# Patient Record
Sex: Female | Born: 1945 | Race: Black or African American
Health system: Southern US, Community
[De-identification: ages and names within clinical notes are randomized; demographics above are authoritative.]

## PROBLEM LIST (undated history)

## (undated) ENCOUNTER — Ambulatory Visit (INDEPENDENT_AMBULATORY_CARE_PROVIDER_SITE_OTHER): Admission: RE | Payer: Self-pay | Admitting: Surgery

## (undated) DIAGNOSIS — E119 Type 2 diabetes mellitus without complications: Secondary | ICD-10-CM

## (undated) DIAGNOSIS — C801 Malignant (primary) neoplasm, unspecified: Secondary | ICD-10-CM

## (undated) DIAGNOSIS — E785 Hyperlipidemia, unspecified: Secondary | ICD-10-CM

## (undated) DIAGNOSIS — C50919 Malignant neoplasm of unspecified site of unspecified female breast: Secondary | ICD-10-CM

## (undated) DIAGNOSIS — R011 Cardiac murmur, unspecified: Secondary | ICD-10-CM

## (undated) DIAGNOSIS — Z9289 Personal history of other medical treatment: Secondary | ICD-10-CM

## (undated) DIAGNOSIS — R21 Rash and other nonspecific skin eruption: Secondary | ICD-10-CM

## (undated) DIAGNOSIS — H269 Unspecified cataract: Secondary | ICD-10-CM

## (undated) DIAGNOSIS — B269 Mumps without complication: Secondary | ICD-10-CM

## (undated) DIAGNOSIS — N63 Unspecified lump in unspecified breast: Secondary | ICD-10-CM

## (undated) DIAGNOSIS — R55 Syncope and collapse: Secondary | ICD-10-CM

## (undated) DIAGNOSIS — M1990 Unspecified osteoarthritis, unspecified site: Secondary | ICD-10-CM

## (undated) DIAGNOSIS — I4430 Unspecified atrioventricular block: Secondary | ICD-10-CM

## (undated) DIAGNOSIS — J45909 Unspecified asthma, uncomplicated: Secondary | ICD-10-CM

## (undated) DIAGNOSIS — K649 Unspecified hemorrhoids: Secondary | ICD-10-CM

## (undated) DIAGNOSIS — I4510 Unspecified right bundle-branch block: Secondary | ICD-10-CM

## (undated) DIAGNOSIS — B019 Varicella without complication: Secondary | ICD-10-CM

## (undated) DIAGNOSIS — D649 Anemia, unspecified: Secondary | ICD-10-CM

## (undated) DIAGNOSIS — I10 Essential (primary) hypertension: Secondary | ICD-10-CM

## (undated) DIAGNOSIS — B059 Measles without complication: Secondary | ICD-10-CM

## (undated) DIAGNOSIS — M4800 Spinal stenosis, site unspecified: Secondary | ICD-10-CM

## (undated) DIAGNOSIS — I4589 Other specified conduction disorders: Secondary | ICD-10-CM

## (undated) DIAGNOSIS — K219 Gastro-esophageal reflux disease without esophagitis: Secondary | ICD-10-CM

## (undated) DIAGNOSIS — E7800 Pure hypercholesterolemia, unspecified: Secondary | ICD-10-CM

## (undated) DIAGNOSIS — T7840XA Allergy, unspecified, initial encounter: Secondary | ICD-10-CM

## (undated) DIAGNOSIS — E7439 Other disorders of intestinal carbohydrate absorption: Secondary | ICD-10-CM

## (undated) DIAGNOSIS — Z973 Presence of spectacles and contact lenses: Secondary | ICD-10-CM

## (undated) DIAGNOSIS — N83209 Unspecified ovarian cyst, unspecified side: Secondary | ICD-10-CM

## (undated) DIAGNOSIS — N183 Chronic kidney disease, stage 3 unspecified: Secondary | ICD-10-CM

## (undated) DIAGNOSIS — Z86018 Personal history of other benign neoplasm: Secondary | ICD-10-CM

## (undated) DIAGNOSIS — N289 Disorder of kidney and ureter, unspecified: Secondary | ICD-10-CM

## (undated) DIAGNOSIS — R102 Pelvic and perineal pain unspecified side: Secondary | ICD-10-CM

## (undated) DIAGNOSIS — F419 Anxiety disorder, unspecified: Secondary | ICD-10-CM

## (undated) DIAGNOSIS — K819 Cholecystitis, unspecified: Secondary | ICD-10-CM

## (undated) DIAGNOSIS — D509 Iron deficiency anemia, unspecified: Secondary | ICD-10-CM

## (undated) DIAGNOSIS — M26609 Unspecified temporomandibular joint disorder, unspecified side: Secondary | ICD-10-CM

## (undated) DIAGNOSIS — M8580 Other specified disorders of bone density and structure, unspecified site: Secondary | ICD-10-CM

## (undated) DIAGNOSIS — R600 Localized edema: Secondary | ICD-10-CM

## (undated) DIAGNOSIS — E039 Hypothyroidism, unspecified: Secondary | ICD-10-CM

## (undated) HISTORY — DX: Personal history of other medical treatment: Z92.89

## (undated) HISTORY — DX: Rash and other nonspecific skin eruption: R21

## (undated) HISTORY — DX: Measles without complication: B05.9

## (undated) HISTORY — DX: Essential (primary) hypertension: I10

## (undated) HISTORY — DX: Syncope and collapse: R55

## (undated) HISTORY — PX: HYSTERECTOMY: SHX81

## (undated) HISTORY — PX: ABLATION OF DYSRHYTHMIC FOCUS: SHX254

## (undated) HISTORY — PX: BREAST BIOPSY: SHX20

## (undated) HISTORY — PX: TUMOR REMOVAL: SHX12

## (undated) HISTORY — DX: Varicella without complication: B01.9

## (undated) HISTORY — DX: Malignant (primary) neoplasm, unspecified: C80.1

## (undated) HISTORY — DX: Unspecified asthma, uncomplicated: J45.909

## (undated) HISTORY — DX: Other specified conduction disorders: I45.89

## (undated) HISTORY — DX: Type 2 diabetes mellitus without complications: E11.9

## (undated) HISTORY — PX: KNEE SURGERY: SHX244

## (undated) HISTORY — DX: Unspecified atrioventricular block: I44.30

## (undated) HISTORY — DX: Unspecified hemorrhoids: K64.9

## (undated) HISTORY — DX: Cardiac murmur, unspecified: R01.1

## (undated) HISTORY — DX: Hyperlipidemia, unspecified: E78.5

## (undated) HISTORY — DX: Malignant neoplasm of unspecified site of unspecified female breast: C50.919

## (undated) HISTORY — DX: Unspecified lump in unspecified breast: N63.0

## (undated) HISTORY — DX: Mumps without complication: B26.9

## (undated) HISTORY — PX: HAND SURGERY: SHX662

## (undated) HISTORY — DX: Spinal stenosis, site unspecified: M48.00

## (undated) HISTORY — PX: INCONTINENCE SURGERY: SHX676

## (undated) HISTORY — DX: Other specified disorders of bone density and structure, unspecified site: M85.80

## (undated) HISTORY — PX: DIAGNOSTIC LAPAROSCOPY: SUR761

## (undated) HISTORY — DX: Unspecified temporomandibular joint disorder, unspecified side: M26.609

## (undated) HISTORY — PX: COLONOSCOPY: SHX174

## (undated) HISTORY — DX: Other disorders of intestinal carbohydrate absorption: E74.39

## (undated) HISTORY — PX: JOINT REPLACEMENT: SHX530

## (undated) HISTORY — PX: TUBAL LIGATION: SHX77

## (undated) HISTORY — PX: EYE SURGERY: SHX253

## (undated) HISTORY — DX: Allergy, unspecified, initial encounter: T78.40XA

## (undated) HISTORY — PX: OTHER SURGICAL HISTORY: SHX169

## (undated) HISTORY — PX: CATARACT EXTRACTION: SUR2

---

## 1980-09-03 HISTORY — PX: ABDOMINAL HYSTERECTOMY: SHX81

## 1998-11-25 ENCOUNTER — Other Ambulatory Visit: Admission: RE | Admit: 1998-11-25 | Discharge: 1998-11-25 | Payer: Self-pay | Admitting: Obstetrics and Gynecology

## 1999-11-27 ENCOUNTER — Other Ambulatory Visit: Admission: RE | Admit: 1999-11-27 | Discharge: 1999-11-27 | Payer: Self-pay | Admitting: Obstetrics and Gynecology

## 2000-02-05 ENCOUNTER — Emergency Department: Admit: 2000-02-05 | Payer: Self-pay | Source: Emergency Department | Admitting: Emergency Medicine

## 2000-05-01 ENCOUNTER — Ambulatory Visit
Admit: 2000-05-01 | Disposition: A | Discharge status: Home / Self Care | Payer: Self-pay | Source: Ambulatory Visit | Admitting: Radiation Oncology

## 2000-05-14 ENCOUNTER — Ambulatory Visit: Admission: RE | Admit: 2000-05-14 | Payer: Self-pay | Source: Ambulatory Visit

## 2000-05-17 ENCOUNTER — Ambulatory Visit: Admission: RE | Admit: 2000-05-17 | Payer: Self-pay | Source: Ambulatory Visit

## 2000-06-05 ENCOUNTER — Ambulatory Visit: Admission: RE | Admit: 2000-06-05 | Payer: Self-pay | Source: Ambulatory Visit

## 2000-07-30 ENCOUNTER — Ambulatory Visit
Admit: 2000-07-30 | Disposition: A | Discharge status: Home / Self Care | Payer: Self-pay | Source: Ambulatory Visit | Admitting: Radiation Oncology

## 2000-12-20 ENCOUNTER — Other Ambulatory Visit: Admission: RE | Admit: 2000-12-20 | Discharge: 2000-12-20 | Payer: Self-pay | Admitting: Obstetrics and Gynecology

## 2002-01-12 ENCOUNTER — Other Ambulatory Visit: Admission: RE | Admit: 2002-01-12 | Discharge: 2002-01-12 | Payer: Self-pay | Admitting: Obstetrics and Gynecology

## 2002-06-30 ENCOUNTER — Ambulatory Visit
Admit: 2002-06-30 | Disposition: A | Discharge status: Home / Self Care | Payer: Self-pay | Source: Ambulatory Visit | Admitting: Family Medicine

## 2002-07-12 ENCOUNTER — Emergency Department: Admit: 2002-07-12 | Payer: Self-pay | Source: Emergency Department | Admitting: Internal Medicine

## 2003-01-22 ENCOUNTER — Other Ambulatory Visit: Admission: RE | Admit: 2003-01-22 | Discharge: 2003-01-22 | Payer: Self-pay | Admitting: Obstetrics and Gynecology

## 2003-04-23 ENCOUNTER — Emergency Department (HOSPITAL_COMMUNITY): Admission: EM | Admit: 2003-04-23 | Discharge: 2003-04-24 | Payer: Self-pay | Admitting: Emergency Medicine

## 2003-04-23 ENCOUNTER — Encounter: Payer: Self-pay | Admitting: Emergency Medicine

## 2003-04-24 ENCOUNTER — Emergency Department: Admit: 2003-04-24 | Payer: Self-pay | Source: Emergency Department

## 2003-04-27 ENCOUNTER — Other Ambulatory Visit (HOSPITAL_COMMUNITY): Admission: RE | Admit: 2003-04-27 | Discharge: 2003-05-07 | Payer: Self-pay | Admitting: Psychiatry

## 2003-06-25 ENCOUNTER — Encounter: Admission: RE | Admit: 2003-06-25 | Discharge: 2003-06-25 | Payer: Self-pay | Admitting: Family Medicine

## 2003-06-25 ENCOUNTER — Encounter: Payer: Self-pay | Admitting: Family Medicine

## 2003-06-30 ENCOUNTER — Encounter: Admission: RE | Admit: 2003-06-30 | Discharge: 2003-06-30 | Payer: Self-pay | Admitting: Family Medicine

## 2004-03-22 ENCOUNTER — Other Ambulatory Visit: Admission: RE | Admit: 2004-03-22 | Discharge: 2004-03-22 | Payer: Self-pay | Admitting: Obstetrics and Gynecology

## 2004-05-17 ENCOUNTER — Ambulatory Visit: Payer: Self-pay | Admitting: Psychology

## 2004-07-26 ENCOUNTER — Ambulatory Visit: Payer: Self-pay | Admitting: Psychology

## 2005-02-09 ENCOUNTER — Encounter: Admission: RE | Admit: 2005-02-09 | Discharge: 2005-02-09 | Payer: Self-pay | Admitting: Nephrology

## 2005-04-25 ENCOUNTER — Ambulatory Visit
Admit: 2005-04-25 | Disposition: A | Discharge status: Home / Self Care | Payer: Self-pay | Source: Ambulatory Visit | Admitting: Family Medicine

## 2005-05-16 ENCOUNTER — Other Ambulatory Visit: Admission: RE | Admit: 2005-05-16 | Discharge: 2005-05-16 | Payer: Self-pay | Admitting: Obstetrics and Gynecology

## 2005-05-30 ENCOUNTER — Emergency Department: Admit: 2005-05-30 | Payer: Self-pay | Source: Emergency Department | Admitting: Emergency Medicine

## 2005-09-03 HISTORY — PX: OTHER SURGICAL HISTORY: SHX169

## 2005-09-11 ENCOUNTER — Ambulatory Visit (HOSPITAL_BASED_OUTPATIENT_CLINIC_OR_DEPARTMENT_OTHER): Admission: RE | Admit: 2005-09-11 | Discharge: 2005-09-11 | Payer: Self-pay | Admitting: Urology

## 2005-09-30 ENCOUNTER — Emergency Department: Admit: 2005-09-30 | Payer: Self-pay | Source: Emergency Department | Admitting: Emergency Medicine

## 2005-09-30 LAB — URINALYSIS WITH MICROSCOPIC
Bilirubin, UA: NEGATIVE
Glucose, UA: NEGATIVE
Ketones UA: NEGATIVE
Leukocyte Esterase, UA: NEGATIVE
Nitrite, UA: NEGATIVE
Protein, UR: NEGATIVE
Specific Gravity UA POCT: 1.015 (ref <=1.030)
Urine pH: 7 (ref 5.0–8.0)
Urobilinogen, UA: 0.2

## 2006-06-14 ENCOUNTER — Emergency Department: Admit: 2006-06-14 | Payer: Self-pay | Source: Emergency Department | Admitting: Emergency Medicine

## 2006-06-14 LAB — CBC WITH AUTO DIFFERENTIAL CERNER
Basophils Absolute: 0.1 /mm3 (ref 0.0–0.2)
Basophils: 1 % (ref 0–2)
Eosinophils Absolute: 0.1 /mm3 (ref 0.0–0.7)
Eosinophils: 1 % (ref 0–5)
Granulocytes Absolute: 8.2 /mm3 — ABNORMAL HIGH (ref 1.8–8.1)
Hematocrit: 36.9 % — ABNORMAL LOW (ref 37.0–47.0)
Hgb: 12.2 G/DL (ref 12.0–16.0)
Lymphocytes Absolute: 1.1 /mm3 (ref 0.5–4.4)
Lymphocytes: 10 % — ABNORMAL LOW (ref 15–41)
MCH: 22.5 PG — ABNORMAL LOW (ref 28.0–32.0)
MCHC: 33 G/DL (ref 32.0–36.0)
MCV: 68 FL — ABNORMAL LOW (ref 80–100)
MPV: 7.9 FL (ref 7.4–10.4)
Monocytes Absolute: 0.8 /mm3 (ref 0.0–1.2)
Monocytes: 7 % (ref 0–11)
Neutrophils %: 80 % — ABNORMAL HIGH (ref 52–75)
Platelets: 217 /mm3 (ref 140–400)
RBC: 5.42 /mm3 — ABNORMAL HIGH (ref 4.20–5.40)
RDW: 13.9 % (ref 11.5–15.0)
WBC: 10.2 /mm3 (ref 3.5–10.8)

## 2006-06-14 LAB — HEPATIC FUNCTION PANEL
ALT: 51 U/L — ABNORMAL HIGH (ref 4–36)
AST (SGOT): 40 U/L (ref 10–41)
Albumin/Globulin Ratio: 1.2 (ref 1.1–1.8)
Albumin: 4.2 G/DL (ref 3.4–4.9)
Alkaline Phosphatase: 98 U/L (ref 43–112)
Bilirubin Direct: 0 MG/DL — AB (ref 0.0–0.3)
Bilirubin Indirect: 0.9 MG/DL (ref 0.0–1.1)
Bilirubin, Total: 0.6 MG/DL (ref 0.2–1.0)
Globulin: 3.5 G/DL (ref 2.0–3.7)
Protein, Total: 7.7 G/DL (ref 6.0–8.0)

## 2006-06-14 LAB — BASIC METABOLIC PANEL
BUN: 25 MG/DL — ABNORMAL HIGH (ref 7–21)
CO2: 28 MEQ/L (ref 22–31)
Calcium: 9.8 MG/DL (ref 8.6–10.2)
Chloride: 99 MEQ/L (ref 98–107)
Creatinine: 1 MG/DL (ref 0.5–1.4)
Glucose: 157 MG/DL — ABNORMAL HIGH (ref 65–110)
Potassium: 3.6 MEQ/L (ref 3.6–5.0)
Sodium: 140 MEQ/L (ref 136–143)

## 2007-03-28 ENCOUNTER — Ambulatory Visit
Admit: 2007-03-28 | Disposition: A | Discharge status: Home / Self Care | Payer: Self-pay | Source: Ambulatory Visit | Admitting: Orthopaedic Surgery

## 2007-04-04 ENCOUNTER — Ambulatory Visit
Admit: 2007-04-04 | Disposition: A | Discharge status: Home / Self Care | Payer: Self-pay | Source: Ambulatory Visit | Admitting: Orthopaedic Surgery

## 2007-04-04 LAB — BASIC METABOLIC PANEL
BUN: 20 mg/dL (ref 8–20)
CO2: 29 mEq/L (ref 21–30)
Calcium: 9.1 mg/dL (ref 8.6–10.2)
Chloride: 104 mEq/L (ref 98–107)
Creatinine: 1 mg/dL (ref 0.6–1.5)
Glucose: 114 mg/dL — ABNORMAL HIGH (ref 70–100)
Potassium: 4 mEq/L (ref 3.6–5.0)
Sodium: 140 mEq/L (ref 136–146)

## 2007-04-04 LAB — CBC- CERNER
Hematocrit: 30.6 % — ABNORMAL LOW (ref 37.0–47.0)
Hgb: 10.1 G/DL — ABNORMAL LOW (ref 12.0–16.0)
MCH: 23.4 PG — ABNORMAL LOW (ref 28.0–32.0)
MCHC: 33.1 G/DL (ref 32.0–36.0)
MCV: 70.7 FL — ABNORMAL LOW (ref 80.0–100.0)
MPV: 7.5 FL (ref 7.4–10.4)
Platelets: 227 /mm3 (ref 140–400)
RBC: 4.33 /mm3 (ref 4.20–5.40)
RDW: 13.6 % (ref 11.5–15.0)
WBC: 6 /mm3 (ref 3.5–10.8)

## 2007-04-04 LAB — GFR

## 2007-04-18 ENCOUNTER — Ambulatory Visit: Admission: RE | Admit: 2007-04-18 | Payer: Self-pay | Source: Ambulatory Visit | Admitting: Orthopaedic Surgery

## 2008-10-25 ENCOUNTER — Ambulatory Visit: Payer: Self-pay | Admitting: Gastroenterology

## 2008-10-27 ENCOUNTER — Ambulatory Visit
Admit: 2008-10-27 | Disposition: A | Discharge status: Home / Self Care | Payer: Self-pay | Source: Ambulatory Visit | Admitting: Rheumatology

## 2008-11-17 ENCOUNTER — Emergency Department: Admit: 2008-11-17 | Payer: Self-pay | Source: Emergency Department | Admitting: Emergency Medicine

## 2008-11-22 ENCOUNTER — Ambulatory Visit: Payer: Self-pay | Admitting: Gastroenterology

## 2009-01-05 ENCOUNTER — Ambulatory Visit
Admit: 2009-01-05 | Disposition: A | Discharge status: Home / Self Care | Payer: Self-pay | Source: Ambulatory Visit | Admitting: Family Medicine

## 2009-10-27 ENCOUNTER — Ambulatory Visit: Payer: Self-pay

## 2009-10-31 LAB — LAB USE ONLY - HISTORICAL SURGICAL PATHOLOGY

## 2010-08-16 ENCOUNTER — Ambulatory Visit: Admission: RE | Admit: 2010-08-16 | Payer: Self-pay | Source: Ambulatory Visit | Admitting: Gastroenterology

## 2010-08-17 LAB — LAB USE ONLY - HISTORICAL SURGICAL PATHOLOGY

## 2010-08-22 ENCOUNTER — Emergency Department: Admit: 2010-08-22 | Payer: Self-pay | Source: Emergency Department | Admitting: Emergency Medicine

## 2010-10-03 NOTE — Procedures (Signed)
Summary: Colonoscopy   Colonoscopy  Procedure date:  11/22/2008  Findings:      Location:  [facility].    Procedures Next Due Date:    Colonoscopy: 12/2018  COLONOSCOPY PROCEDURE REPORT  PATIENT:  April Beck, April Beck  MR#:  [id number] BIRTHDATE:   [birth year], 62 yrs. old   GENDER:   female  ENDOSCOPIST:   Rachael Fee, MD Referred by: Lynnea Ferrier, M.D.  PROCEDURE DATE:  11/22/2008 PROCEDURE:  Colonoscopy, diagnostic ASA CLASS:   Class II INDICATIONS: colorectal cancer screening, average risk   MEDICATIONS:    Fentanyl 50 mcg IV, Versed 5 mg IV  DESCRIPTION OF PROCEDURE:   After the risks benefits and alternatives of the procedure were thoroughly explained, informed consent was obtained.  Digital rectal exam was performed and revealed no rectal masses.   The LB [id number]-[id number] endoscope was introduced through the anus and advanced to the cecum, which was identified by both the appendix and ileocecal valve, without limitations.  The quality of the prep was good, using MoviPrep.  The instrument was then slowly withdrawn as the colon was fully examined. <<PROCEDUREIMAGES>>      <<OLD IMAGES>>  FINDINGS:  Mild diverticulosis was found sigmoid to descending  Internal and external hemorrhoids were found. These were medium sized, non-thrombosed.  This was otherwise a normal examination (see image2, image3, and image6).  No polyps or cancers were seen.   Retroflexed views in the rectum revealed no abnormalities.    The scope was then withdrawn from the patient and the procedure completed.  COMPLICATIONS:   None  ENDOSCOPIC IMPRESSION:  1) Mild diverticulosis in the sigmoid to descending  2) Internal and external hemorrhoids  3) Otherwise normal examination  4) No polyps or cancers  RECOMMENDATIONS:  1) You should continue follow current colorectal cancer screening guidelines for "routine risk" patients with a repeat colonoscopy in 10 years. I do not  recommend other colon cancer screening prior to then (including stool tests for microscopic blood) unless new symptoms arise.      REPEAT EXAM:   colonoscopy in 10 years   _______________________________ Rachael Fee, MD    CC: Lynnea Ferrier, MD

## 2010-10-03 NOTE — Miscellaneous (Signed)
Summary: DIRECT COLON SCREEN-AGE/YF  Clinical Lists Changes  Medications: Added new medication of MOVIPREP 100 GM  SOLR (PEG-KCL-NACL-NASULF-NA ASC-C) As per prep instructions. - Signed Rx of MOVIPREP 100 GM  SOLR (PEG-KCL-NACL-NASULF-NA ASC-C) As per prep instructions.;  #1 x 0;  Signed;  Entered by: Greer Ee RN;  Authorized by: Rachael Fee MD;  Method used: [facility] to [id number] [address] [place], [place], [place]  [address], Ph: [phone] or [phone], Fax: [phone] Observations: Added new observation of NKA: T (10/25/2008 14:03)    Prescriptions: MOVIPREP 100 GM  SOLR (PEG-KCL-NACL-NASULF-NA ASC-C) As per prep instructions.  #1 x 0   Entered by:   Greer Ee RN   Authorized by:   Rachael Fee MD   Signed by:   Greer Ee RN on 10/25/2008   Method used:   [facility]  [address] [phone]* (retail)       [address]       [place], [place]  [address]       Ph: [phone] or [phone]       Fax: [phone]   RxID:   [id number]

## 2010-11-22 ENCOUNTER — Ambulatory Visit (INDEPENDENT_AMBULATORY_CARE_PROVIDER_SITE_OTHER)
Admit: 2010-11-22 | Disposition: A | Discharge status: Home / Self Care | Payer: Self-pay | Source: Ambulatory Visit | Admitting: Family Medicine

## 2011-01-19 NOTE — Op Note (Signed)
NAMEPAMLA, PANGLE              ACCOUNT NO.:  [id number]   MEDICAL RECORD NO.:  [id number]          PATIENT TYPE:  AMB   LOCATION:  [facility]                         FACILITY:  [facility]   PHYSICIAN:  Excell Seltzer. Annabell Howells, M.D.    DATE OF BIRTH:  1946-01-26   DATE OF PROCEDURE:  09/11/2005  DATE OF DISCHARGE:                                 OPERATIVE REPORT   PROCEDURE:  Lynx sling.   PREOPERATIVE DIAGNOSIS:  Stress incontinence.   POSTOPERATIVE DIAGNOSIS:  Stress incontinence.   SURGEON:  Dr. Bjorn Pippin.   ANESTHESIA:  General.   DRAIN:  Foley catheter and vaginal pack.   COMPLICATIONS:  None.   INDICATIONS:  Ms. Happel is a 65 year old white female with stress  incontinence whose elected a sling for correction.   FINDINGS AND PROCEDURE:  The patient had been given p.o. Cipro  preoperatively. She was taken to the operating room where a general  anesthetic was induced. She was placed in lithotomy position, her mons was  shaved, her perineum and genitalia were prepped with Betadine solution and  she was draped in the usual sterile fashion. A Foley catheter was inserted  and the bladder was drained. A weighted vaginal retractor was placed, the  anterior vaginal wall was infiltrated with 1% lidocaine with epinephrine  approximately 5 mL. A midline anterior vaginal wall incision was made  approximately 2 cm in length over the mid urethral area. The mucosa was  elevated laterally approximately 2 cm to allow placement of a finger  adjacent to the urethra on each side. Small incisions were made 2 cm lateral  to the midline on each side above the pubis. The fat was spread to the  fascia.   The first [place] trocar was passed on the right, the tip was brought down to  the top the pubis, walked along the back of the pubis until it could be  palpated by a finger in the right vaginal incision. The trocar was then  passed into the vaginal vault. This was then repeated on the left side.   Cystoscopy was then performed using a 22-French scope and 70 degree lens.  Examination revealed no evidence of bladder wall injury. The bladder was  drained.   The [place] sling was then secured to two trocars and drawn up into the  abdominal incisions. Repeat cystoscopy at this point once again revealed no  evidence of bladder wall or urethral injury.   At this point, the Foley catheter was reinserted, the sling was tensioned,  the sheaths were removed and the position was confirmed. Once the device was  felt to be adequately tensioned, the vaginal incision was closed using  running locked 2-0 Vicryl suture. The abdominal ends of the sling were then  trimmed and the abdominal incisions were closed with Dermabond. A 2-inch  Iodoform vaginal pack was placed.   The patient was taken down from the lithotomy position, her anesthetic was  reversed. She was moved to the recovery room in stable condition, there were  no complications.      Excell Seltzer. Annabell Howells, M.D.  Electronically Signed  JJW/MEDQ  D:  09/11/2005  T:  09/11/2005  Job:  [id number]   cc:   Ernestina Penna, M.D.  Fax: [phone]

## 2011-04-07 ENCOUNTER — Emergency Department
Admit: 2011-04-07 | Disposition: A | Discharge status: Home / Self Care | Payer: Self-pay | Source: Emergency Department | Admitting: Emergency Medicine

## 2011-04-07 LAB — URINALYSIS, REFLEX TO MICROSCOPIC EXAM IF INDICATED
Bilirubin, UA: NEGATIVE
Blood, UA: NEGATIVE
Glucose, UA: NEGATIVE
Ketones UA: NEGATIVE
Leukocyte Esterase, UA: NEGATIVE
Nitrite, UA: NEGATIVE
Protein, UR: NEGATIVE
Specific Gravity UA POCT: 1.02 (ref 1.001–1.035)
Urine pH: 6 (ref 5.0–8.0)
Urobilinogen, UA: 0.2 mg/dL

## 2011-05-27 ENCOUNTER — Emergency Department
Admit: 2011-05-27 | Disposition: A | Discharge status: Home / Self Care | Payer: Self-pay | Source: Emergency Department | Admitting: Emergency Medicine

## 2011-06-12 LAB — ECG 12-LEAD
Atrial Rate: 90 {beats}/min
P Axis: 51 degrees
P-R Interval: 188 ms
Q-T Interval: 402 ms
QRS Duration: 98 ms
QTC Calculation (Bezet): 491 ms
R Axis: -20 degrees
T Axis: 29 degrees
Ventricular Rate: 90 {beats}/min

## 2011-06-21 NOTE — Op Note (Unsigned)
DATE OF BIRTH:                        [birth year]      ADMISSION DATE:                     04/18/2007            PATIENT LOCATION:                     [id number]            DATE OF PROCEDURE:                   04/18/2007      SURGEON:                            Renaldo Reel, MD      ASSISTANT(S):                  PREOPERATIVE DIAGNOSIS:  RECURRENT RIGHT CARPAL TUNNEL SYNDROME.            POSTOPERATIVE DIAGNOSIS:   RECURRENT RIGHT CARPAL TUNNEL SYNDROME.            PROCEDURES:      1.   REVISION RIGHT CARPAL TUNNEL SYNDROME.      2.   HYPOTHENAR FAT PAD COVERAGE FOR MEDIAN NERVE RIGHT CARPAL TUNNEL.            ANESTHESIA:  Bier block.            TOURNIQUET TIME:  60 minutes.            IMPLANTS:  None.            DRAINS:  None.            SPECIMENS:  None.            COMPLICATIONS:  None.            INDICATIONS:  Ms. Helen [name] is a 65 year old female who had symptoms of right      hand numbness. She had an original right carpal tunnel release back in      1991. She had worsening symptoms most recently, and between 2006 and 2008      had separate electrodiagnostic studies that showed progressive worsening.      She had improvement from a cortisone injection into the carpal tunnel for 2      months but then subsided.  She was indicated for the above-mentioned      procedure.  The risks, benefits, and alternatives were explained.  Informed      consent was obtained.            DESCRIPTION OF PROCEDURE:  The patient was placed supine with the right      upper extremity on a hand table.  A Bier block was administered by      anesthesia.  The extremity was then prepped and draped in a sterile      fashion.  A longitudinal incision in the palm was made in line with the 3-4      web space and then oblique across the distal wrist crease.  It was carried      down through skin and subcutaneous tissue.  The palmar fascia was      identified and incised longitudinally.  Deep to this the transverse carpal  ligament was  identified and incised in the mid wound.  It was incised in a      distal direction to the fat pad keeping in line with the 3-4 web space, and      then it was carried in a proximal direction and carried proximal to the      distal wrist crease.  Inspection of the median nerve seemed to show that it      was slightly more adherent to the radial leaf of the now incised transverse      carpal ligament. This adhesion was released bluntly.  Next, the hypothenar      fat pad graft was raised.  It was raised sharply until it was in line with      the fifth ray.  In terms of the deep dissection for the fat pad graft, the      ulnar neurovascular bundle was identified just ulnar to the ulnar leaf of      the transverse carpal ligament.  The ulnar artery was encountered first.      Once the deep part of the fat pad graft was freed, a significant portion of      the ulnar leaf of the transverse carpal ligament was excised. The fat pad      graft was then pulled over in a radial direction, and pulling the median      nerve and flexor tendons as well as the FPL in an ulnar direction, the fat      pad was able to reach the radial wall of the carpal tunnel.  Two mattress      sutures with nonabsorbable suture were then placed from the radial aspect      of the hypothenar fat pad graft to the radial wall of the carpal tunnel      while retracting the FPL as well as median nerve and other flexor tendons      in an ulnar direction.  These two sutures were placed first before tying      each of them sequentially.  The wounds were then irrigated with copious      amounts of normal saline. The skin was reapproximated with nylon suture.  A      dry, sterile dressing was placed as well as a splint to cup the ulnar and      radial aspects of the hand as well as maintain radial and palmar abduction      of the thumb to take tension off the graft. Local anesthetic had been      placed prior to dressing.  Sponge and needle counts were  correct x2.                                          ___________________________________          Date Signed: __________      Renaldo Reel, MD  ([id number])            D: 04/19/2007 by Renaldo Reel, MD      T: 04/20/2007 by [id number] ([id number]) ([id number])      cc:  Renaldo Reel, MD

## 2011-08-21 NOTE — Op Note (Signed)
Introduction: [id number] Document ID: [id number] -- 65 year old female      patient presents for an outpatient Colonoscopy on 08/16/2010.            Indications: Average-risk screening.            Consent: The benefits, risks, and alternatives to the procedure were      discussed. I have discussed with the patient, especially the risks of      bleeding, perforation, and infection. Informed consent was obtained from      the patient.            Preparation: EKG, pulse, pulse oximetry, and blood pressure were monitored      throughout the procedure.            Medications: IVA anesthesia. Medication administered by anesthesiologist.            Rectal Exam: Small internal hemorrhoids. No masses were felt on digital      rectal examination.            Procedure: The colonoscope was passed through the anus under direct      visualization and was advanced with ease to the cecum, confirmed by      landmarks. The colonoscope was then introduced into the terminal ileum.      The scope was gradually withdrawn and the mucosa was carefully examined.      The quality of the preparation was excellent. The views were excellent.      The patient's toleration of the procedure was excellent. Retroflexion was      performed in the rectum.            Estimated Blood Loss: Negligible.            Findings: There was evidence of mild diverticulosis in the mid-sigmoid.  A      single sessile polyp, measuring 13 mm in size, was found in the sigmoid      colon and distal sigmoid.  The polyp was completely removed by snare      cautery polypectomy. The polyp was retrieved and placed in jar 1. A single      small flat polyp, measuring 4 mm in size, was found in the colon.  The      polyp was completely removed by hot biopsy polypectomy. The polyp was      retrieved.            Unplanned Events: There were no unplanned events.            Summary: Small internal hemorrhoids were found (455.0). Mild      diverticulosis (562.10) found in the  mid-sigmoid. A single sessile polyp      was found in the sigmoid colon and distal sigmoid (211.3); removed by      snare cautery polypectomy. A single small flat polyp was found in the      colon; removed by hot biopsy polypectomy.            Recommendations: Start high fiber diet. Follow-up appointment with Elsie Ra, MD in 1 month.            Patient Transfer: The patient condition on transfer to recovery was stable.            Procedure Codes: [45385]Colonoscopy with snare polypectomy [45384]:      Colonoscopy with removal of tumor(s), polyp(s), or other lesion(s) by hot  biopsy forceps or bipolar cautery      Version 1, electronically signed by Dr. Elsie Ra on 08/16/2010 at      10:30.

## 2011-08-21 NOTE — Op Note (Signed)
Introduction:[id number] Document ID: [id number] -- 66 year old female      patient presents for an outpatient Esophagogastroduodenoscopy on      08/16/2010.            Indications: GERD (530.81).            Consent: The benefits, risks, and alternatives to the procedure were      discussed. The potential risks of bleeding, perforation, and infection are      especially explained to the patient. Informed consent was obtained from      the patient.            Preparation: EKG, pulse, pulse oximetry, and blood pressure were monitored      throughout the procedure.            Medications: IVA anesthesia. Medication administered by anesthesiologist.            Procedure: The gastroscope was passed through the mouth under direct      visualization and was advanced with ease to the 4th portion of the      duodenum. The scope was withdrawn and the mucosa was carefully examined.      The views were excellent. The patient's toleration of the procedure was      excellent. Retroflexion was performed in the stomach.            Estimated Blood Loss: Negligible.            Findings:   Esophagus: There was a 1 cm hiatus hernia visible in the      esophagus.  Esophagitis was found in the distal esophagus. There was      erosion in the esophagus.  Stomach: Mild gastritis was found in the      antrum.  Multiple cold forceps biopsies were taken.  Duodenum: The      duodenum appeared to be normal. The major papilla appeared to be normal.            Unplanned Events: There were no unplanned events.            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 Esophagitis      seen (530.10). An area of esophageal erosion was found (530.89). Mild      gastritis was found in the antrum (535.50). Multiple biopsies taken.      Normal duodenum. Normal major papilla.            Recommendations: Follow-up appointment with Elsie Ra, MD in 1      month. Raise the head of the bed 4 to 6 inches. Avoid smoking. Avoid      excess  coffee, tea or other caffeinated beverages. Avoid garments that fit      tightly through the abdomen. Avoid eating before bed. Start anti-reflux      diet. Continue medications as prescribed.            Patient Transfer: The patient condition on transfer to recovery was stable.            Procedure Codes: [43239]EGD with biopsy      Version 1, electronically signed by Dr. Elsie Ra on 08/16/2010 at      10:02.

## 2012-01-21 ENCOUNTER — Emergency Department
Admit: 2012-01-21 | Discharge: 2012-01-21 | Disposition: A | Discharge status: Home / Self Care | Payer: Self-pay | Source: Emergency Department | Admitting: Emergency Medicine

## 2012-03-24 ENCOUNTER — Encounter (INDEPENDENT_AMBULATORY_CARE_PROVIDER_SITE_OTHER): Payer: Self-pay | Admitting: Specialist

## 2012-03-24 ENCOUNTER — Ambulatory Visit (INDEPENDENT_AMBULATORY_CARE_PROVIDER_SITE_OTHER): Payer: Medicare Other | Admitting: Specialist

## 2012-03-24 VITALS — BP 111/63 | HR 74 | Temp 98.0°F | Ht 62.0 in | Wt 181.8 lb

## 2012-03-24 DIAGNOSIS — I10 Essential (primary) hypertension: Secondary | ICD-10-CM

## 2012-03-24 DIAGNOSIS — E785 Hyperlipidemia, unspecified: Secondary | ICD-10-CM

## 2012-03-24 DIAGNOSIS — IMO0001 Reserved for inherently not codable concepts without codable children: Secondary | ICD-10-CM

## 2012-03-24 DIAGNOSIS — IMO0002 Reserved for concepts with insufficient information to code with codable children: Secondary | ICD-10-CM

## 2012-03-24 DIAGNOSIS — E119 Type 2 diabetes mellitus without complications: Secondary | ICD-10-CM

## 2012-03-24 NOTE — Progress Notes (Signed)
CHIEF COMPLAINT:  Type 2 diabetes.     HISTORY OF PRESENT ILLNESS:  Helen [name] is a pleasant 66 year old who presents in our clinic for  initial evaluation.     [name] tells me she was diagnosed with diabetes approximately 20 years  ago and has no known complications from it.  Her most recent hemoglobin A1c  in May of 2013 was noted to be elevated at 9.8%.  Her point of care  hemoglobin A1c in clinic today is 8.2%, which is significantly improved  since her last A1c.  She currently manages her diabetes by taking glipizide  5 mg twice a day.  She tells me she was on metformin before, but gave her  facial swelling and therefore it was discontinued.     [name] did not bring her glucometer to the clinic today.  She tells me  that her fasting blood glucoses typically range between 100 to 150 mg/dL.   She also mentioned that recently due to financial reasons, she has been on  food stamps, and typically she has been able to only get cheap foods, which  are more rich in carbohydrates.  Therefore, she has not been able to be  more compliant with her diabetic diet.  She is aware of the implications of  uncontrolled type 2 diabetes, such as neuropathy, nephropathy, and  retinopathy.  She also follows with a dietitian and tells me that she has  been working toward weight loss.     Ms. Hartis blood pressure in our clinic today is at goal at 111/63.  She  is currently on amlodipine and Diovan with hydrochlorothiazide daily for  her blood pressure.  Recent serum creatinine level done in May of 2013 is  noted to be normal at 0.73.  However, urine microalbumin level was not done  at the time.  Her recent cholesterol panel showed her LDL was at goal at  48.  She is currently on Zetia 10 mg daily.  No known history of coronary  artery disease.  She denies any paresthesias or numbness in her lower  extremities.  No prior history of open ulcers, lesions, or amputations.   She denies any history of retinopathy.  She tells  me she follows with an  ophthalmologist annually.  Denies any eye symptoms today.     REVIEW OF SYSTEMS:  As mentioned above.  In addition, denies any nausea, vomiting, abdominal  pain, hematuria, or dysuria.  Denies any polyuria, polyphagia, or  polydipsia.  Denies paresthesias or numbness in her lower extremities.   Mood is stable.  All other systems reviewed and were negative.     PAST MEDICAL HISTORY:  Significant for history of hypertension, history of hyperlipidemia, history  of type 2 diabetes, history of breast cancer.     CURRENT MEDICATIONS:  Amlodipine 10 mg daily, Lipitor 40 mg daily, Zetia 10 mg daily, glipizide 5  mg twice a day, Protonix 40 mg daily, Diovan/hydrochlorothiazide 160/12.5  mg daily.     ALLERGIES:  ASPIRIN causes sweatiness and dizziness, METFORMIN causes facial swelling.     FAMILY HISTORY:  No known endocrinopathies.     SOCIAL HISTORY:  Denies any tobacco, alcohol, or illicit drug use.  She is disabled due to  chronic medical condition.  Lives with her daughter.     PHYSICAL EXAMINATION:  VITAL SIGNS:  Blood pressure 111/63, pulse 74 per minute and regular,  weight 181 pounds, height 5 feet 2 inches, BMI 33.2.  GENERAL:  Pleasant and conversant, in no acute distress.  HEENT:  Pupils equal and reactive bilaterally.  Moist mucous membranes.  NECK:  Supple and not enlarged.  Thyroid palpable and not enlarged.  LUNGS:  Clear to auscultation bilaterally.  No wheezes or rales.  CARDIOVASCULAR:  Regular rate and rhythm.  ABDOMEN:  Soft, nontender.  Bowel sounds heard.  EXTREMITIES:  No edema seen.  NEUROLOGIC:  Deep tendon reflexes 1+ at the ankle and at the knees.  SKIN:  Warm and dry.  No rash seen.  EXTREMITIES:  No edema.  Monofilament 10/10 bilaterally.  PSYCHIATRIC:  Mood and affect normal.     LABORATORY AND DIAGNOSTIC DATA:  Point of care hemoglobin A1c 8.2% in May of 2013.  Sodium 139, potassium  3.7, calcium 9.8, AST 21, ALT 20, total protein 7.4.  Creatinine 0.7,  albumin 4.3.   Hemoglobin A1c 9.8%.  Hemoglobin 11.3; hematocrit 36.4;  platelets 249.  Total cholesterol 122, triglycerides 42, LDL 48, HDL 67.   TSH 1.21.     ASSESSMENT AND PLAN:  [name] is a 66 year old who presents in our clinic for evaluation of  uncontrolled type 2 diabetes.  1.  Glycemic control:  Her hemoglobin A1c has improved significantly since  May of 2013.  It was 8.2% as opposed to 9.8% before.  I encouraged her to  continue her dietary modifications and increase her glipizide to 10 mg  twice a day.  The patient has unfortunately not been able to tolerate  metformin in the past.  If glycemic control does not improve on sulfanuria,  additional therapy, such as DDP-4 inhibitor, may be considered.  The  patient is aware of the complications of uncontrolled diabetes and willing  toward tighter glycemic control.  I would like to repeat a hemoglobin A1c,  comprehensive panel, lipid profile, urine microalbumin, and thyroid  function tests in 3 months.  I gave her a requisition for this.  2.  Diabetes complications:  A.  Retinopathy:  No known history.  I encouraged her to have annual  retinopathy surveillance exam.  Denies any eye symptoms today.  B.  Neuropathy:  No symptoms of neuropathy.  Foot exam was normal in the  clinic today.  Discussed diabetic foot hygiene.  C.  Nephropathy and hypertension:  Blood pressure is at goal.  She is  currently on ARB therapy.  I would like to check a urine microalbumin  level.    D.  Hyperlipidemia:  Currently on Zetia and Lipitor.  Recent lipid profile  is at goal.     It was a pleasure taking care of Ms. Tauzin in our clinic.  I would like  to see her back in our clinic in 3 months.  However, I asked her to contact  us sooner if she were to have symptoms of hypoglycemia or hyperglycemia.

## 2012-03-25 LAB — POCT HEMOGLOBIN A1C: POCT Hgb A1C: 8.2 — AB (ref 3.9–5.9)

## 2012-03-25 LAB — POCT GLUCOSE: Whole Blood Glucose POCT: 321 mg/dL — AB (ref 70–100)

## 2012-03-28 ENCOUNTER — Encounter (INDEPENDENT_AMBULATORY_CARE_PROVIDER_SITE_OTHER): Payer: Self-pay

## 2012-03-29 ENCOUNTER — Encounter (INDEPENDENT_AMBULATORY_CARE_PROVIDER_SITE_OTHER): Payer: Self-pay | Admitting: Specialist

## 2012-03-29 DIAGNOSIS — IMO0002 Reserved for concepts with insufficient information to code with codable children: Secondary | ICD-10-CM | POA: Insufficient documentation

## 2012-03-29 DIAGNOSIS — I10 Essential (primary) hypertension: Secondary | ICD-10-CM | POA: Insufficient documentation

## 2012-03-29 DIAGNOSIS — E785 Hyperlipidemia, unspecified: Secondary | ICD-10-CM | POA: Insufficient documentation

## 2012-04-08 ENCOUNTER — Telehealth (INDEPENDENT_AMBULATORY_CARE_PROVIDER_SITE_OTHER): Payer: Self-pay | Admitting: Specialist

## 2012-04-08 ENCOUNTER — Other Ambulatory Visit (INDEPENDENT_AMBULATORY_CARE_PROVIDER_SITE_OTHER): Payer: Self-pay

## 2012-04-08 MED ORDER — GLIPIZIDE 10 MG PO TABS
10.0000 mg | ORAL_TABLET | Freq: Two times a day (BID) | ORAL | Status: DC
Start: 2012-04-08 — End: 2012-09-04

## 2012-04-08 NOTE — Telephone Encounter (Signed)
PT. CALLED FOR A GLIPIZIDE REFILL (AS PER PATIENT, TAKES TWO 5MG  TABLETS TWICE A DAY) FOR 90 DAYS 3 REFILLS TO [facility] @[phone].. PT. ONLY HAS FOUR PILLS LEFT, FORWARDING CONCERN TO THE NURSE.-OKT

## 2012-04-25 ENCOUNTER — Telehealth (INDEPENDENT_AMBULATORY_CARE_PROVIDER_SITE_OTHER): Payer: Self-pay | Admitting: Specialist

## 2012-04-25 NOTE — Telephone Encounter (Signed)
PATIENT CALLED TO NOTIFY Helen [name] THAT SHE IS OUT OF GLIPIZIDE  AND NEEDS A REFILL SENT TO HER [facility] ON [address]. THE PATIENT NEEDS MEDICATION BEFORE 5P.M. TO MAINTAIN A TIMELY DOSEAGE (PT PHONE:[phone])-OKT

## 2012-04-25 NOTE — Telephone Encounter (Signed)
TG,    Pls call Glipizide 5 mg ( 2 pills) twice daily for this pt. Give 90 day supply with 1 refill.

## 2012-04-25 NOTE — Telephone Encounter (Signed)
I CALLED IN GLIPIZIDE 5 MG TWO PILLS PO BID 90 DAYS WITH 1 REFILL TO [facility]. PT IS INFORMED.

## 2012-05-19 ENCOUNTER — Ambulatory Visit
Admission: RE | Admit: 2012-05-19 | Discharge: 2012-05-19 | Disposition: A | Discharge status: Home / Self Care | Payer: 59 | Source: Ambulatory Visit | Attending: Family Medicine | Admitting: Family Medicine

## 2012-05-19 ENCOUNTER — Other Ambulatory Visit: Payer: Self-pay | Admitting: Family Medicine

## 2012-05-19 DIAGNOSIS — R7989 Other specified abnormal findings of blood chemistry: Secondary | ICD-10-CM

## 2012-05-20 ENCOUNTER — Other Ambulatory Visit: Payer: Self-pay

## 2012-05-21 ENCOUNTER — Other Ambulatory Visit: Payer: Self-pay

## 2012-06-19 ENCOUNTER — Encounter (HOSPITAL_COMMUNITY): Payer: Self-pay | Admitting: Emergency Medicine

## 2012-06-19 ENCOUNTER — Observation Stay (HOSPITAL_COMMUNITY)
Admission: EM | Admit: 2012-06-19 | Discharge: 2012-06-21 | Disposition: A | Discharge status: Home / Self Care | Payer: 59 | Attending: General Surgery | Admitting: General Surgery

## 2012-06-19 ENCOUNTER — Emergency Department (HOSPITAL_COMMUNITY): Payer: 59

## 2012-06-19 DIAGNOSIS — E7800 Pure hypercholesterolemia, unspecified: Secondary | ICD-10-CM | POA: Insufficient documentation

## 2012-06-19 DIAGNOSIS — R1013 Epigastric pain: Secondary | ICD-10-CM | POA: Insufficient documentation

## 2012-06-19 DIAGNOSIS — K812 Acute cholecystitis with chronic cholecystitis: Secondary | ICD-10-CM

## 2012-06-19 DIAGNOSIS — K219 Gastro-esophageal reflux disease without esophagitis: Secondary | ICD-10-CM | POA: Insufficient documentation

## 2012-06-19 DIAGNOSIS — K819 Cholecystitis, unspecified: Secondary | ICD-10-CM | POA: Diagnosis present

## 2012-06-19 DIAGNOSIS — N289 Disorder of kidney and ureter, unspecified: Secondary | ICD-10-CM

## 2012-06-19 DIAGNOSIS — I10 Essential (primary) hypertension: Secondary | ICD-10-CM | POA: Insufficient documentation

## 2012-06-19 DIAGNOSIS — K821 Hydrops of gallbladder: Secondary | ICD-10-CM | POA: Insufficient documentation

## 2012-06-19 DIAGNOSIS — Z23 Encounter for immunization: Secondary | ICD-10-CM | POA: Insufficient documentation

## 2012-06-19 DIAGNOSIS — M19049 Primary osteoarthritis, unspecified hand: Secondary | ICD-10-CM | POA: Insufficient documentation

## 2012-06-19 DIAGNOSIS — F411 Generalized anxiety disorder: Secondary | ICD-10-CM | POA: Insufficient documentation

## 2012-06-19 DIAGNOSIS — R112 Nausea with vomiting, unspecified: Secondary | ICD-10-CM | POA: Insufficient documentation

## 2012-06-19 DIAGNOSIS — E039 Hypothyroidism, unspecified: Secondary | ICD-10-CM

## 2012-06-19 HISTORY — DX: Unspecified osteoarthritis, unspecified site: M19.90

## 2012-06-19 HISTORY — DX: Cholecystitis, unspecified: K81.9

## 2012-06-19 HISTORY — DX: Hypothyroidism, unspecified: E03.9

## 2012-06-19 HISTORY — DX: Anxiety disorder, unspecified: F41.9

## 2012-06-19 HISTORY — DX: Disorder of kidney and ureter, unspecified: N28.9

## 2012-06-19 HISTORY — DX: Pure hypercholesterolemia, unspecified: E78.00

## 2012-06-19 HISTORY — DX: Gastro-esophageal reflux disease without esophagitis: K21.9

## 2012-06-19 HISTORY — DX: Essential (primary) hypertension: I10

## 2012-06-19 LAB — CBC
HCT: 39.1 % (ref 36.0–46.0)
Hemoglobin: 12.7 g/dL (ref 12.0–15.0)
MCH: 30.8 pg (ref 26.0–34.0)
MCHC: 32.5 g/dL (ref 30.0–36.0)
MCV: 94.7 fL (ref 78.0–100.0)
Platelets: 178 10*3/uL (ref 150–400)
RBC: 4.13 MIL/uL (ref 3.87–5.11)
RDW: 13 % (ref 11.5–15.5)
WBC: 7.3 10*3/uL (ref 4.0–10.5)

## 2012-06-19 LAB — CBC WITH DIFFERENTIAL/PLATELET
Basophils Absolute: 0 10*3/uL (ref 0.0–0.1)
Basophils Relative: 0 % (ref 0–1)
Eosinophils Absolute: 0.1 10*3/uL (ref 0.0–0.7)
Eosinophils Relative: 1 % (ref 0–5)
HCT: 41.8 % (ref 36.0–46.0)
Hemoglobin: 13.7 g/dL (ref 12.0–15.0)
Lymphocytes Relative: 20 % (ref 12–46)
Lymphs Abs: 2.3 10*3/uL (ref 0.7–4.0)
MCH: 30.9 pg (ref 26.0–34.0)
MCHC: 32.8 g/dL (ref 30.0–36.0)
MCV: 94.1 fL (ref 78.0–100.0)
Monocytes Absolute: 0.8 10*3/uL (ref 0.1–1.0)
Monocytes Relative: 7 % (ref 3–12)
Neutro Abs: 8.4 10*3/uL — ABNORMAL HIGH (ref 1.7–7.7)
Neutrophils Relative %: 72 % (ref 43–77)
Platelets: 191 10*3/uL (ref 150–400)
RBC: 4.44 MIL/uL (ref 3.87–5.11)
RDW: 12.9 % (ref 11.5–15.5)
WBC: 11.6 10*3/uL — ABNORMAL HIGH (ref 4.0–10.5)

## 2012-06-19 LAB — CREATININE, SERUM
Creatinine, Ser: 1.04 mg/dL (ref 0.50–1.10)
GFR calc Af Amer: 64 mL/min — ABNORMAL LOW (ref >90)
GFR calc non Af Amer: 55 mL/min — ABNORMAL LOW (ref >90)

## 2012-06-19 LAB — COMPREHENSIVE METABOLIC PANEL
ALT: 20 U/L (ref 0–35)
AST: 23 U/L (ref 0–37)
Albumin: 3.9 g/dL (ref 3.5–5.2)
Alkaline Phosphatase: 73 U/L (ref 39–117)
BUN: 30 mg/dL — ABNORMAL HIGH (ref 6–23)
CO2: 29 mEq/L (ref 19–32)
Calcium: 9.4 mg/dL (ref 8.4–10.5)
Chloride: 101 mEq/L (ref 96–112)
Creatinine, Ser: 1.27 mg/dL — ABNORMAL HIGH (ref 0.50–1.10)
GFR calc Af Amer: 50 mL/min — ABNORMAL LOW (ref >90)
GFR calc non Af Amer: 43 mL/min — ABNORMAL LOW (ref >90)
Glucose, Bld: 155 mg/dL — ABNORMAL HIGH (ref 70–99)
Potassium: 3.8 mEq/L (ref 3.5–5.1)
Sodium: 144 mEq/L (ref 135–145)
Total Bilirubin: 0.4 mg/dL (ref 0.3–1.2)
Total Protein: 7.2 g/dL (ref 6.0–8.3)

## 2012-06-19 LAB — LIPASE, BLOOD: Lipase: 59 U/L (ref 11–59)

## 2012-06-19 MED ORDER — HYDROMORPHONE HCL PF 1 MG/ML IJ SOLN
1.0000 mg | Freq: Once | INTRAMUSCULAR | Status: AC
  Administered 2012-06-19: 1 mg via INTRAVENOUS
  Filled 2012-06-19: qty 1

## 2012-06-19 MED ORDER — PNEUMOCOCCAL VAC POLYVALENT 25 MCG/0.5ML IJ INJ
0.5000 mL | INJECTION | INTRAMUSCULAR | Status: AC
  Administered 2012-06-20: 0.5 mL via INTRAMUSCULAR
  Filled 2012-06-19: qty 0.5

## 2012-06-19 MED ORDER — HYDROMORPHONE HCL PF 1 MG/ML IJ SOLN
0.5000 mg | INTRAMUSCULAR | Status: DC | PRN
  Administered 2012-06-20 – 2012-06-21 (×2): 0.5 mg via INTRAVENOUS
  Administered 2012-06-21: 1 mg via INTRAVENOUS
  Filled 2012-06-19 (×5): qty 1

## 2012-06-19 MED ORDER — PANTOPRAZOLE SODIUM 40 MG IV SOLR
40.0000 mg | Freq: Once | INTRAVENOUS | Status: AC
  Administered 2012-06-19: 40 mg via INTRAVENOUS
  Filled 2012-06-19: qty 40

## 2012-06-19 MED ORDER — DIPHENHYDRAMINE HCL 50 MG/ML IJ SOLN
12.5000 mg | Freq: Four times a day (QID) | INTRAMUSCULAR | Status: DC | PRN
  Administered 2012-06-20: 25 mg via INTRAVENOUS
  Filled 2012-06-19: qty 1

## 2012-06-19 MED ORDER — HEPARIN SODIUM (PORCINE) 5000 UNIT/ML IJ SOLN
5000.0000 [IU] | Freq: Three times a day (TID) | INTRAMUSCULAR | Status: DC
  Administered 2012-06-19 – 2012-06-21 (×6): 5000 [IU] via SUBCUTANEOUS
  Filled 2012-06-19 (×10): qty 1

## 2012-06-19 MED ORDER — CIPROFLOXACIN IN D5W 400 MG/200ML IV SOLN
400.0000 mg | Freq: Two times a day (BID) | INTRAVENOUS | Status: DC
  Administered 2012-06-19 – 2012-06-21 (×5): 400 mg via INTRAVENOUS
  Filled 2012-06-19 (×6): qty 200

## 2012-06-19 MED ORDER — DIPHENHYDRAMINE HCL 12.5 MG/5ML PO ELIX
12.5000 mg | ORAL_SOLUTION | Freq: Four times a day (QID) | ORAL | Status: DC | PRN
  Filled 2012-06-19: qty 10

## 2012-06-19 MED ORDER — NEBIVOLOL HCL 5 MG PO TABS
5.0000 mg | ORAL_TABLET | Freq: Every day | ORAL | Status: DC
  Administered 2012-06-19 – 2012-06-20 (×2): 5 mg via ORAL
  Filled 2012-06-19 (×4): qty 1

## 2012-06-19 MED ORDER — ONDANSETRON HCL 4 MG/2ML IJ SOLN: 4.0000 mg | Freq: Four times a day (QID) | INTRAMUSCULAR | Status: DC | PRN

## 2012-06-19 MED ORDER — INFLUENZA VIRUS VACC SPLIT PF IM SUSP
0.5000 mL | INTRAMUSCULAR | Status: AC
  Administered 2012-06-20: 0.5 mL via INTRAMUSCULAR
  Filled 2012-06-19: qty 0.5

## 2012-06-19 MED ORDER — ONDANSETRON HCL 4 MG/2ML IJ SOLN
4.0000 mg | Freq: Once | INTRAMUSCULAR | Status: AC
  Administered 2012-06-19: 4 mg via INTRAVENOUS
  Filled 2012-06-19: qty 2

## 2012-06-19 MED ORDER — GI COCKTAIL ~~LOC~~
30.0000 mL | Freq: Once | ORAL | Status: AC
  Administered 2012-06-19: 30 mL via ORAL
  Filled 2012-06-19: qty 30

## 2012-06-19 MED ORDER — POTASSIUM CHLORIDE IN NACL 20-0.9 MEQ/L-% IV SOLN
INTRAVENOUS | Status: DC
  Administered 2012-06-19 – 2012-06-21 (×5): via INTRAVENOUS
  Filled 2012-06-19 (×10): qty 1000

## 2012-06-19 NOTE — ED Notes (Signed)
PT. REPORTS PERSISTENT EMESIS WITH UPPER ABDOMINAL PAIN , CHILLS AND BODY ACHES ONSET THIS EVENING .

## 2012-06-19 NOTE — H&P (Signed)
For lap chole in AM. I discussed the procedure in detail.We discussed the risks and benefits of a laparoscopic cholecystectomy and possible cholangiogram including, but not limited to bleeding, infection, injury to surrounding structures such as the intestine or liver, bile leak, retained gallstones, need to convert to an open procedure, prolonged diarrhea, blood clots such as  DVT, common bile duct injury, anesthesia risks, and possible need for additional procedures.  The likelihood of improvement in symptoms and return to the patient's normal status is good. We discussed the typical post-operative recovery course. Patient examined and I agree with the assessment and plan  Violeta Gelinas, MD, MPH, FACS Pager: [phone]  06/19/2012 6:26 PM

## 2012-06-19 NOTE — ED Notes (Signed)
To [place], calm, NAD, "feels better".

## 2012-06-19 NOTE — ED Provider Notes (Signed)
History     CSN: [id number]  Arrival date & time 06/19/12  0444   First MD Initiated Contact with Patient 06/19/12 0501      Chief Complaint  Patient presents with  . Emesis   HPI  History provided by the patient. Patient is a 66 year old female with history of hypertension hypercholesterolemia who presents with complaints of upper abdominal pain with nausea vomiting. Patient reports his symptoms began 3 hours ago prior to arrival. Symptoms have been persistent roughly 6 episodes of vomiting. Patient denies any hematemesis. Pain is described as sharp and severe located in the epigastric area. Pain radiates some to the left upper quadrant and back. Patient in my is alcohol use. She does report having similar symptoms earlier this month while medication in [place]. She states she was seen in emergency room there and evaluated without any specific diagnosis. She states she was given medication to help with nausea symptoms. Since that time she has done relatively well without similar pain or symptoms. Symptoms have also been associated with some chills and sweats. She denies any diarrhea or constipation. Denies any dysuria, hematuria, urinary frequency or flank pain.     Past Medical History  Diagnosis Date  . Hypertension   . Hypercholesteremia     Past Surgical History  Procedure Date  . Abdominal hysterectomy     No family history on file.  History  Substance Use Topics  . Smoking status: Never Smoker   . Smokeless tobacco: Not on file  . Alcohol Use: No    OB History    Grav Para Term Preterm Abortions TAB SAB Ect Mult Living                  Review of Systems  Constitutional: Positive for fever and chills. Negative for diaphoresis.  Respiratory: Negative for shortness of breath.   Cardiovascular: Negative for chest pain.  Gastrointestinal: Positive for nausea, vomiting and abdominal pain. Negative for diarrhea and constipation.  Genitourinary: Negative for  dysuria, frequency, hematuria and flank pain.    Allergies  Review of patient's allergies indicates no known allergies.  Home Medications  No current outpatient prescriptions on file.  BP 145/77  Pulse 64  Resp 20  SpO2 99%  Physical Exam  Nursing note and vitals reviewed. Constitutional: She is oriented to person, place, and time. She appears well-developed and well-nourished. No distress.  HENT:  Head: Normocephalic.  Cardiovascular: Normal rate and regular rhythm.   Pulmonary/Chest: Effort normal and breath sounds normal. No respiratory distress. She has no wheezes. She has no rales.  Abdominal: Soft. There is tenderness in the epigastric area and left upper quadrant. There is no rebound and no guarding.  Neurological: She is alert and oriented to person, place, and time.  Skin: Skin is warm and dry.  Psychiatric: She has a normal mood and affect. Her behavior is normal.    ED Course  Procedures   Results for orders placed during the hospital encounter of 06/19/12  CBC WITH DIFFERENTIAL      Component Value Range   WBC 11.6 (*) 4.0 - 10.5 K/uL   RBC 4.44  3.87 - 5.11 MIL/uL   Hemoglobin 13.7  12.0 - 15.0 g/dL   HCT 86.5  78.4 - 69.6 %   MCV 94.1  78.0 - 100.0 fL   MCH 30.9  26.0 - 34.0 pg   MCHC 32.8  30.0 - 36.0 g/dL   RDW 29.5  28.4 - 13.2 %  Platelets 191  150 - 400 K/uL   Neutrophils Relative 72  43 - 77 %   Neutro Abs 8.4 (*) 1.7 - 7.7 K/uL   Lymphocytes Relative 20  12 - 46 %   Lymphs Abs 2.3  0.7 - 4.0 K/uL   Monocytes Relative 7  3 - 12 %   Monocytes Absolute 0.8  0.1 - 1.0 K/uL   Eosinophils Relative 1  0 - 5 %   Eosinophils Absolute 0.1  0.0 - 0.7 K/uL   Basophils Relative 0  0 - 1 %   Basophils Absolute 0.0  0.0 - 0.1 K/uL  COMPREHENSIVE METABOLIC PANEL      Component Value Range   Sodium 144  135 - 145 mEq/L   Potassium 3.8  3.5 - 5.1 mEq/L   Chloride 101  96 - 112 mEq/L   CO2 29  19 - 32 mEq/L   Glucose, Bld 155 (*) 70 - 99 mg/dL   BUN 30  (*) 6 - 23 mg/dL   Creatinine, Ser 1.61 (*) 0.50 - 1.10 mg/dL   Calcium 9.4  8.4 - 09.6 mg/dL   Total Protein 7.2  6.0 - 8.3 g/dL   Albumin 3.9  3.5 - 5.2 g/dL   AST 23  0 - 37 U/L   ALT 20  0 - 35 U/L   Alkaline Phosphatase 73  39 - 117 U/L   Total Bilirubin 0.4  0.3 - 1.2 mg/dL   GFR calc non Af Amer 43 (*) >90 mL/min   GFR calc Af Amer 50 (*) >90 mL/min  LIPASE, BLOOD      Component Value Range   Lipase 59  11 - 59 U/L      No results found.   No diagnosis found.    MDM  5:00AM patient seen and evaluated. Patient appears in moderate discomfort and distress.   Pt feeling much better after medications.  Patient discussed in sign out with Arthor Captain PA-C.  Patient awaiting ultrasound. She will follow results and make disposition.     Angus Seller, [place] 06/19/12 [phone]

## 2012-06-19 NOTE — ED Notes (Signed)
Called report to [place], Charity fundraiser on 6N.

## 2012-06-19 NOTE — Progress Notes (Signed)
UR completed 

## 2012-06-19 NOTE — ED Provider Notes (Signed)
7:14 AM BP 145/77  Pulse 64  Temp 98.5 F (36.9 C) (Oral)  Resp 20  SpO2 99%  Assumed care of patient form April Dammen PA at  6:10 AM.  Patient abdominal pain and nausea resolved with admin of meds. She still has abdominal tenderness in epigastric region.  Awaiting [place] results.     Comprehensive metabolic panel (Final result)  Abnormal  Component (Lab Inquiry)      Result Time  NA  K  CL  CO2  GLUCOSE    06/19/12 0526  144  3.8  101  29  155 (H)           Result Time  BUN  Creatinine, Ser  CALCIUM  PROTEIN  Albumin    06/19/12 0526  30 (H)  1.27 (H)  9.4  7.2  3.9           Result Time  AST  ALT  ALK PHOS  BILI TOTL  GFR calc non Af Amer    06/19/12 0526  23  20  73  0.4  43 (L)           Result Time  GFR calc Af Amer    06/19/12 0526  50 (L) The eGFR has been calculated using the CKD EPI equation. This calculation has not been validated in all clinical situations. eGFR's persistently <90 mL/min signify possible Chronic Kidney Disease.         Lipase, blood (Final result)   Component (Lab Inquiry)      Result Time  LIPASE    06/19/12 0526  59         CBC with Differential (Final result)  Abnormal  Component (Lab Inquiry)      Result Time  WBC  RBC  HGB  HCT  MCV    06/19/12 0507  11.6 (H)  4.44  13.7  41.8  94.1           Result Time  MCH  MCHC  RDW  PLT  NEUTRO PCT    06/19/12 0507  30.9  32.8  12.9  191  72           Result Time  AB NEUTRO  LYMPHO PCT  AB LYM  MONO PCT  MONO ABS    06/19/12 0507  8.4 (H)  20  2.3  7  0.8           Result Time  EOS PCT  EOSINO ABS  BASOS PCT  BASOS ABS    06/19/12 0507  1  0.1  0  0.0       8:46 AM  US Abdomen Complete (Final result)   Result time:06/19/12 0726    Final result by Rad Results In Interface (06/19/12 07:26:04)    Narrative:   *RADIOLOGY REPORT*  Clinical Data: Abdominal pain. Question gallstones.  COMPLETE ABDOMINAL ULTRASOUND  Comparison: None.  Findings:  Gallbladder: A small amount  of pericholecystic fluid is identified and the gallbladder wall is mildly thickened at 0.4 cm. The gallbladder is also mildly hydropic. However, no gallstones or sludge are seen. Sonographer reports negative Murphy's sign. A 0.8 cm polyp is seen in the fundus.  Common bile duct: Measures up to 0.7 cm.  Liver: No focal lesion identified. Within normal limits in parenchymal echogenicity.  IVC: Appears normal.  Pancreas: No focal abnormality seen.  Spleen: Measures 11.1 cm and appears normal.  Right Kidney: Measures 10.3 cm and appears normal.  Left Kidney: Measures  10.4 cm and appears normal.  Abdominal aorta: No aneurysm identified.  IMPRESSION:  1. Negative for gallstones. Mild distention of the gallbladder with gallbladder wall thickening and a small left pericholecystic fluid are nonspecific but could be due to acalculous cholecystitis although this is typically seen in critically ill patients . Cholescintigraphy could be used for further evaluation. 2. 0.8 cm gallbladder polyp.   Original Report Authenticated By: Bernadene Bell. D'ALESSIO, M.D.        BP 107/51  Pulse 54  Temp 97.7 F (36.5 C) (Oral)  Resp 18  SpO2 94% 8:47 AM Patient is doing well and without sxs at this time.  She is tolerating PO fluids.  I spoke with PA Beck- he did not feel the Cardiac workup for epigastric pain was necessary as pain presentation was very specific to RUQ and epigastrum.. I agree with assessment. Patient with signs of acalculous cholecystitis.  I have spoken with surgery who has agreed to consult on the patient and admit.  Arthor Captain, PA-C 06/30/12 2259

## 2012-06-19 NOTE — ED Notes (Signed)
"  feels better", denies pain or nausea at this time, pending [place] of abd. Family at [facility], calm, NAD, interactive.

## 2012-06-19 NOTE — ED Notes (Signed)
C/o sudden onset epigastric pain, onset around ~ 0300, also nv (denies: diarrhea, fever, bleeding, urinary sx, vaginal sx, dizziness, sob, palpitations or other sx), no meds PTA, last ate 1900, last BM 1600. Upper mid abd tender to palpation. Vomited yellow bile ~ 50cc.

## 2012-06-19 NOTE — H&P (Signed)
April Beck is an 66 y.o. female.   Chief Complaint: Abdominal pain nausea and vomiting. Primary care: Dr. Tanya Nones, [place] HPI: Patient is a 66 year old female who this morning around 2:30 AM with midepigastric pain going from below her xiphoid down to her umbilicus and across her abdomen. This was followed with nausea and vomiting. She had about 5 episodes in presented to the ER at [facility]. She had further nausea and vomiting here. Her symptoms improved with medications in the ER currently she is pain free and her nausea has resolved. She recently went [place] to visit her son and had an episode on 06/09/12 she was taken to the emergency room there and evaluated. CT scan was reportedly normal, it was their opinion she had enteritis. Her symptoms resolved in the ER there; she had nausea during that first episode but no vomiting. She was discharged home on what sounds like antivirals and an anti-emetics. She was good first 24 hours after discharge but had another episode on 10/ 9/13. Symptoms last another 2 days. She came home to [facility], and was seen by her primary care physician Dr. Tanya Nones; she reports she had significant renal insufficiency. She's been seen and treated and this has reportedly resolved. She had labs last week which she reports were normal. Workup in the ER here shows a white count of 11,600. H/H 13.7/41.8. LFTs are normal, lipase is 59. BUN is 30 creatinine is 1.27. Abdominal ultrasound shows pericholecystic fluid identified in the gallbladder wall was mildly thickened. The gallbladder is moderate mildly hydropic no Murphy sign there is a small 0.8 cm polyps in the fundus. Common bile duct measured 0.7 cm. There was no gallstones or sludge seen in the gallbladder. We are asked in consultation.  Past Medical History  Diagnosis Date  . Hypertension   . Hypercholesteremia   . Renal insufficiency BMI 28 History of anxiety 06/19/2012    Past Surgical  History  Procedure Date  . Abdominal hysterectomy   . Lynx bladder sling 09/2005    Dr. Annabell Howells    No family history on file. Social History:  reports that she has never smoked. She does not have any smokeless tobacco history on file. She reports that she does not drink alcohol or use illicit drugs. 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  Allergies: No Known Allergies Prior to Admission medications   Medication Sig Start Date End Date Taking? Authorizing Provider  atorvastatin (LIPITOR) 10 MG tablet Take 10 mg by mouth daily.   Yes Historical Provider, MD  nebivolol (BYSTOLIC) 5 MG tablet Take 5 mg by mouth daily.   Yes Historical Provider, MD     (Not in a hospital admission)  Results for orders placed during the hospital encounter of 06/19/12 (from the past 48 hour(s))  CBC WITH DIFFERENTIAL     Status: Abnormal   Collection Time   06/19/12  4:53 AM      Component Value Range Comment   WBC 11.6 (*) 4.0 - 10.5 K/uL    RBC 4.44  3.87 - 5.11 MIL/uL    Hemoglobin 13.7  12.0 - 15.0 g/dL    HCT 57.8  46.9 - 62.9 %    MCV 94.1  78.0 - 100.0 fL    MCH 30.9  26.0 - 34.0 pg    MCHC 32.8  30.0 - 36.0 g/dL    RDW 52.8  41.3 - 24.4 %    Platelets 191  150 - 400 K/uL  Neutrophils Relative 72  43 - 77 %    Neutro Abs 8.4 (*) 1.7 - 7.7 K/uL    Lymphocytes Relative 20  12 - 46 %    Lymphs Abs 2.3  0.7 - 4.0 K/uL    Monocytes Relative 7  3 - 12 %    Monocytes Absolute 0.8  0.1 - 1.0 K/uL    Eosinophils Relative 1  0 - 5 %    Eosinophils Absolute 0.1  0.0 - 0.7 K/uL    Basophils Relative 0  0 - 1 %    Basophils Absolute 0.0  0.0 - 0.1 K/uL   COMPREHENSIVE METABOLIC PANEL     Status: Abnormal   Collection Time   06/19/12  4:53 AM      Component Value Range Comment   Sodium 144  135 - 145 mEq/L    Potassium 3.8  3.5 - 5.1 mEq/L    Chloride 101  96 - 112 mEq/L    CO2 29  19 - 32 mEq/L    Glucose, Bld 155 (*) 70 - 99 mg/dL    BUN 30 (*) 6 - 23 mg/dL     Creatinine, Ser 1.61 (*) 0.50 - 1.10 mg/dL    Calcium 9.4  8.4 - 09.6 mg/dL    Total Protein 7.2  6.0 - 8.3 g/dL    Albumin 3.9  3.5 - 5.2 g/dL    AST 23  0 - 37 U/L    ALT 20  0 - 35 U/L    Alkaline Phosphatase 73  39 - 117 U/L    Total Bilirubin 0.4  0.3 - 1.2 mg/dL    GFR calc non Af Amer 43 (*) >90 mL/min    GFR calc Af Amer 50 (*) >90 mL/min   LIPASE, BLOOD     Status: Normal   Collection Time   06/19/12  4:53 AM      Component Value Range Comment   Lipase 59  11 - 59 U/L    US Abdomen Complete  06/19/2012  *RADIOLOGY REPORT*  Clinical Data:  Abdominal pain.  Question gallstones.  COMPLETE ABDOMINAL ULTRASOUND  Comparison:  None.  Findings:  Gallbladder:  A small amount of pericholecystic fluid is identified and the gallbladder wall is mildly thickened at 0.4 cm. The gallbladder is also mildly hydropic.  However, no gallstones or sludge are seen.  Sonographer reports negative Murphy's sign. A 0.8 cm polyp is seen in the fundus.  Common bile duct:  Measures up to 0.7 cm.  Liver:  No focal lesion identified.  Within normal limits in parenchymal echogenicity.  IVC:  Appears normal.  Pancreas:  No focal abnormality seen.  Spleen:  Measures 11.1 cm and appears normal.  Right Kidney:  Measures 10.3 cm and appears normal.  Left Kidney:  Measures 10.4 cm and appears normal.  Abdominal aorta:  No aneurysm identified.  IMPRESSION:  1.  Negative for gallstones.  Mild distention of the gallbladder with gallbladder wall thickening and a small left pericholecystic fluid are nonspecific but could be due to acalculous cholecystitis although this is typically seen in critically ill patients . Cholescintigraphy could be used for further evaluation. 2.  0.8 cm gallbladder polyp.   Original Report Authenticated By: Bernadene Bell. Maricela Curet, M.D.     Review of Systems  Constitutional: Positive for fever (not documented) and chills. Negative for weight loss, malaise/fatigue and diaphoresis.  HENT: Negative.     Eyes: Negative.   Genitourinary: Negative.   Musculoskeletal:  Positive for back pain and joint pain (above left knee, also some arthritis in her hands). Negative for falls.  Skin: Negative.   Neurological: Negative.  Negative for weakness.  Endo/Heme/Allergies: Negative.   Psychiatric/Behavioral: The patient is nervous/anxious (hx of anxiety attack, better now on anxiolytic medicine).     Blood pressure 133/67, pulse 53, temperature 97.4 F (36.3 C), temperature source Oral, resp. rate 14, SpO2 94.00%. Physical Exam  Constitutional: She is oriented to person, place, and time. She appears well-developed and well-nourished. No distress.  HENT:  Head: Normocephalic and atraumatic.  Nose: Nose normal.  Eyes: Conjunctivae normal and EOM are normal. Pupils are equal, round, and reactive to light. Right eye exhibits no discharge. Left eye exhibits no discharge. No scleral icterus.  Neck: Normal range of motion. Neck supple. No JVD present. No tracheal deviation present. No thyromegaly present.  Cardiovascular: Normal rate, regular rhythm, normal heart sounds and intact distal pulses.  Exam reveals no gallop and no friction rub.   No murmur heard. Respiratory: Effort normal and breath sounds normal. No stridor. No respiratory distress. She has no wheezes. She has no rales. She exhibits no tenderness.  GI: Soft. Bowel sounds are normal. She exhibits no distension and no mass. There is tenderness (she points to mid epigastric below xyphoid, going to umbilicus and across abdomen, currently not having pain.). There is no rebound and no guarding.  Musculoskeletal: Normal range of motion. She exhibits no edema and no tenderness.  Lymphadenopathy:    She has no cervical adenopathy.  Neurological: She is alert and oriented to person, place, and time. No cranial nerve deficit.  Skin: Skin is warm and dry. No rash noted. She is not diaphoretic. No erythema. No pallor.  Psychiatric: She has a normal mood  and affect. Her behavior is normal. Judgment and thought content normal.     Assessment/Plan 1. Acalculous cholecystitis with hydropic gallbladder. 2. Mild renal insufficiency with a history of renal insufficiency earlier in the month. 3. Hypertension 4. Hypothyroid 6. Hyperlipidemia 7. BMI 28  Plan: We'll admit the patient for observation, antibiotics, probable cholecystectomy tomorrow. We will obtain the records from Dr. Caren Macadam office, rehydrate with further evaluation and treatment as needed.  Florestine Carmical 06/19/2012, 10:15 AM

## 2012-06-19 NOTE — ED Notes (Signed)
Back from US.

## 2012-06-20 ENCOUNTER — Observation Stay (HOSPITAL_COMMUNITY): Payer: 59 | Admitting: Certified Registered Nurse Anesthetist

## 2012-06-20 ENCOUNTER — Encounter (HOSPITAL_COMMUNITY): Payer: Self-pay | Admitting: Certified Registered Nurse Anesthetist

## 2012-06-20 ENCOUNTER — Encounter (HOSPITAL_COMMUNITY)
Admission: EM | Disposition: A | Discharge status: Home / Self Care | Payer: Self-pay | Source: Home / Self Care | Attending: Emergency Medicine

## 2012-06-20 HISTORY — PX: CHOLECYSTECTOMY: SHX55

## 2012-06-20 LAB — COMPREHENSIVE METABOLIC PANEL
ALT: 229 U/L — ABNORMAL HIGH (ref 0–35)
AST: 434 U/L — ABNORMAL HIGH (ref 0–37)
Albumin: 2.9 g/dL — ABNORMAL LOW (ref 3.5–5.2)
Alkaline Phosphatase: 72 U/L (ref 39–117)
BUN: 19 mg/dL (ref 6–23)
CO2: 28 mEq/L (ref 19–32)
Calcium: 8.2 mg/dL — ABNORMAL LOW (ref 8.4–10.5)
Chloride: 106 mEq/L (ref 96–112)
Creatinine, Ser: 1.08 mg/dL (ref 0.50–1.10)
GFR calc Af Amer: 61 mL/min — ABNORMAL LOW (ref >90)
GFR calc non Af Amer: 53 mL/min — ABNORMAL LOW (ref >90)
Glucose, Bld: 105 mg/dL — ABNORMAL HIGH (ref 70–99)
Potassium: 3.8 mEq/L (ref 3.5–5.1)
Sodium: 144 mEq/L (ref 135–145)
Total Bilirubin: 1.2 mg/dL (ref 0.3–1.2)
Total Protein: 5.7 g/dL — ABNORMAL LOW (ref 6.0–8.3)

## 2012-06-20 LAB — LIPASE, BLOOD: Lipase: 42 U/L (ref 11–59)

## 2012-06-20 LAB — CBC
HCT: 36.8 % (ref 36.0–46.0)
Hemoglobin: 11.5 g/dL — ABNORMAL LOW (ref 12.0–15.0)
MCH: 29.8 pg (ref 26.0–34.0)
MCHC: 31.3 g/dL (ref 30.0–36.0)
MCV: 95.3 fL (ref 78.0–100.0)
Platelets: 139 10*3/uL — ABNORMAL LOW (ref 150–400)
RBC: 3.86 MIL/uL — ABNORMAL LOW (ref 3.87–5.11)
RDW: 13.2 % (ref 11.5–15.5)
WBC: 6.5 10*3/uL (ref 4.0–10.5)

## 2012-06-20 SURGERY — LAPAROSCOPIC CHOLECYSTECTOMY
Anesthesia: General | Site: Abdomen | Wound class: Contaminated

## 2012-06-20 MED ORDER — 0.9 % SODIUM CHLORIDE (POUR BTL) OPTIME
TOPICAL | Status: DC | PRN
  Administered 2012-06-20: 1000 mL

## 2012-06-20 MED ORDER — VECURONIUM BROMIDE 10 MG IV SOLR
INTRAVENOUS | Status: DC | PRN
  Administered 2012-06-20: 4 mg via INTRAVENOUS

## 2012-06-20 MED ORDER — ATROPINE SULFATE 1 MG/ML IJ SOLN
INTRAMUSCULAR | Status: DC | PRN
  Administered 2012-06-20 (×2): 0.2 mg via INTRAVENOUS

## 2012-06-20 MED ORDER — ONDANSETRON HCL 4 MG/2ML IJ SOLN
INTRAMUSCULAR | Status: DC | PRN
  Administered 2012-06-20: 4 mg via INTRAVENOUS

## 2012-06-20 MED ORDER — LIDOCAINE HCL (CARDIAC) 20 MG/ML IV SOLN
INTRAVENOUS | Status: DC | PRN
  Administered 2012-06-20: 30 mg via INTRAVENOUS

## 2012-06-20 MED ORDER — HYDROMORPHONE HCL PF 1 MG/ML IJ SOLN
0.2500 mg | INTRAMUSCULAR | Status: DC | PRN
  Administered 2012-06-20 (×4): 0.5 mg via INTRAVENOUS

## 2012-06-20 MED ORDER — GLYCOPYRROLATE 0.2 MG/ML IJ SOLN
INTRAMUSCULAR | Status: DC | PRN
  Administered 2012-06-20: 6 mg via INTRAVENOUS

## 2012-06-20 MED ORDER — MIDAZOLAM HCL 5 MG/5ML IJ SOLN
INTRAMUSCULAR | Status: DC | PRN
  Administered 2012-06-20: 2 mg via INTRAVENOUS

## 2012-06-20 MED ORDER — DEXAMETHASONE SODIUM PHOSPHATE 4 MG/ML IJ SOLN
INTRAMUSCULAR | Status: DC | PRN
  Administered 2012-06-20: 4 mg via INTRAVENOUS

## 2012-06-20 MED ORDER — SUCCINYLCHOLINE CHLORIDE 20 MG/ML IJ SOLN
INTRAMUSCULAR | Status: DC | PRN
  Administered 2012-06-20: 100 mg via INTRAVENOUS

## 2012-06-20 MED ORDER — ALBUTEROL SULFATE (5 MG/ML) 0.5% IN NEBU
2.5000 mg | INHALATION_SOLUTION | Freq: Four times a day (QID) | RESPIRATORY_TRACT | Status: DC | PRN
  Administered 2012-06-20: 2.5 mg via RESPIRATORY_TRACT

## 2012-06-20 MED ORDER — FENTANYL CITRATE 0.05 MG/ML IJ SOLN
INTRAMUSCULAR | Status: DC | PRN
  Administered 2012-06-20 (×4): 50 ug via INTRAVENOUS

## 2012-06-20 MED ORDER — LACTATED RINGERS IV SOLN
INTRAVENOUS | Status: DC | PRN
  Administered 2012-06-20 (×2): via INTRAVENOUS

## 2012-06-20 MED ORDER — PROPOFOL 10 MG/ML IV BOLUS
INTRAVENOUS | Status: DC | PRN
  Administered 2012-06-20: 150 mg via INTRAVENOUS

## 2012-06-20 MED ORDER — SODIUM CHLORIDE 0.9 % IR SOLN
Status: DC | PRN
  Administered 2012-06-20 (×2): 1000 mL

## 2012-06-20 MED ORDER — BUPIVACAINE-EPINEPHRINE 0.25% -1:200000 IJ SOLN
INTRAMUSCULAR | Status: DC | PRN
  Administered 2012-06-20: 16 mL

## 2012-06-20 MED ORDER — NEOSTIGMINE METHYLSULFATE 1 MG/ML IJ SOLN
INTRAMUSCULAR | Status: DC | PRN
  Administered 2012-06-20: 4 mg via INTRAVENOUS

## 2012-06-20 MED ORDER — ARTIFICIAL TEARS OP OINT
TOPICAL_OINTMENT | OPHTHALMIC | Status: DC | PRN
  Administered 2012-06-20: 1 via OPHTHALMIC

## 2012-06-20 MED ORDER — OXYCODONE-ACETAMINOPHEN 5-325 MG PO TABS: 1.0000 | ORAL_TABLET | ORAL | Status: DC | PRN

## 2012-06-20 SURGICAL SUPPLY — 46 items
ADH SKN CLS APL DERMABOND .7 (GAUZE/BANDAGES/DRESSINGS) ×2
APPLIER CLIP 5 13 M/L LIGAMAX5 (MISCELLANEOUS) ×3
APPLIER CLIP ROT 10 11.4 M/L (STAPLE)
APR CLP MED LRG 11.4X10 (STAPLE)
APR CLP MED LRG 5 ANG JAW (MISCELLANEOUS) ×2
BAG SPEC RTRVL LRG 6X4 10 (ENDOMECHANICALS) ×2
BLADE SURG ROTATE 9660 (MISCELLANEOUS) IMPLANT
CANISTER SUCTION 2500CC (MISCELLANEOUS) ×3 IMPLANT
CHLORAPREP W/TINT 26ML (MISCELLANEOUS) ×3 IMPLANT
CLIP APPLIE 5 13 M/L LIGAMAX5 (MISCELLANEOUS) ×2 IMPLANT
CLIP APPLIE ROT 10 11.4 M/L (STAPLE) IMPLANT
CLOTH BEACON ORANGE TIMEOUT ST (SAFETY) ×3 IMPLANT
COVER MAYO STAND STRL (DRAPES) ×3 IMPLANT
COVER SURGICAL LIGHT HANDLE (MISCELLANEOUS) ×3 IMPLANT
DECANTER SPIKE VIAL GLASS SM (MISCELLANEOUS) ×6 IMPLANT
DERMABOND ADVANCED (GAUZE/BANDAGES/DRESSINGS) ×1
DERMABOND ADVANCED .7 DNX12 (GAUZE/BANDAGES/DRESSINGS) ×2 IMPLANT
DRAPE C-ARM 42X72 X-RAY (DRAPES) ×3 IMPLANT
DRAPE UTILITY 15X26 W/TAPE STR (DRAPE) ×6 IMPLANT
ELECT REM PT RETURN 9FT ADLT (ELECTROSURGICAL) ×3
ELECTRODE REM PT RTRN 9FT ADLT (ELECTROSURGICAL) ×2 IMPLANT
FILTER SMOKE EVAC LAPAROSHD (FILTER) ×2 IMPLANT
GLOVE BIO SURGEON STRL SZ7.5 (GLOVE) ×2 IMPLANT
GLOVE BIO SURGEON STRL SZ8 (GLOVE) ×3 IMPLANT
GLOVE BIOGEL PI IND STRL 7.5 (GLOVE) ×1 IMPLANT
GLOVE BIOGEL PI IND STRL 8 (GLOVE) ×2 IMPLANT
GLOVE BIOGEL PI INDICATOR 7.5 (GLOVE) ×1
GLOVE BIOGEL PI INDICATOR 8 (GLOVE) ×1
GOWN PREVENTION PLUS XLARGE (GOWN DISPOSABLE) ×3 IMPLANT
GOWN STRL NON-REIN LRG LVL3 (GOWN DISPOSABLE) ×7 IMPLANT
KIT BASIN OR (CUSTOM PROCEDURE TRAY) ×3 IMPLANT
KIT ROOM TURNOVER OR (KITS) ×3 IMPLANT
NS IRRIG 1000ML POUR BTL (IV SOLUTION) ×3 IMPLANT
PAD ARMBOARD 7.5X6 YLW CONV (MISCELLANEOUS) ×3 IMPLANT
POUCH SPECIMEN RETRIEVAL 10MM (ENDOMECHANICALS) ×3 IMPLANT
SCISSORS LAP 5X35 DISP (ENDOMECHANICALS) IMPLANT
SET CHOLANGIOGRAPH 5 50 .035 (SET/KITS/TRAYS/PACK) ×3 IMPLANT
SET IRRIG TUBING LAPAROSCOPIC (IRRIGATION / IRRIGATOR) ×3 IMPLANT
SPECIMEN JAR SMALL (MISCELLANEOUS) ×3 IMPLANT
SUT VIC AB 4-0 PS2 27 (SUTURE) ×3 IMPLANT
TOWEL OR 17X24 6PK STRL BLUE (TOWEL DISPOSABLE) ×3 IMPLANT
TOWEL OR 17X26 10 PK STRL BLUE (TOWEL DISPOSABLE) ×3 IMPLANT
TRAY LAPAROSCOPIC (CUSTOM PROCEDURE TRAY) ×3 IMPLANT
TROCAR HASSON GELL 12X100 (TROCAR) ×3 IMPLANT
TROCAR Z-THREAD FIOS 5X100MM (TROCAR) ×9 IMPLANT
WATER STERILE IRR 1000ML POUR (IV SOLUTION) IMPLANT

## 2012-06-20 NOTE — Anesthesia Preprocedure Evaluation (Addendum)
Anesthesia Evaluation  Patient identified by MRN, date of birth, ID band Patient awake    Reviewed: Allergy & Precautions, H&P , NPO status , Patient's Chart, lab work & pertinent test results  Airway Mallampati: II TM Distance: >3 FB Neck ROM: Full    Dental  (+) Dental Advisory Given   Pulmonary neg pulmonary ROS,          Cardiovascular hypertension, Pt. on medications and Pt. on home beta blockers Rhythm:Regular Rate:Normal     Neuro/Psych Anxiety negative neurological ROS     GI/Hepatic GERD-  Controlled and Medicated,History noted.   Endo/Other  Hypothyroidism   Renal/GU Renal InsufficiencyRenal diseaseHistory of renal insufficeincy. CE     Musculoskeletal   Abdominal   Peds  Hematology negative hematology ROS (+)   Anesthesia Other Findings   Reproductive/Obstetrics                         Anesthesia Physical Anesthesia Plan  ASA: III  Anesthesia Plan: General   Post-op Pain Management:    Induction: Intravenous  Airway Management Planned: Oral ETT  Additional Equipment:   Intra-op Plan: Utilization of Controlled Hypotension per surrgeon request  Post-operative Plan: Extubation in OR  Informed Consent:   Plan Discussed with: CRNA, Anesthesiologist and Surgeon  Anesthesia Plan Comments:        Anesthesia Quick Evaluation

## 2012-06-20 NOTE — Preoperative (Signed)
Beta Blockers   Reason not to administer Beta Blockers:Not Applicable 

## 2012-06-20 NOTE — Op Note (Signed)
06/19/2012 - 06/20/2012  3:10 PM  PATIENT:  April Beck  66 y.o. female  PRE-OPERATIVE DIAGNOSIS:  gallbladder disease  POST-OPERATIVE DIAGNOSIS:  gallbladder disease  PROCEDURE:  Procedure(s): LAPAROSCOPIC CHOLECYSTECTOMY  SURGEON:  Surgeon(s): Liz Malady, MD  PHYSICIAN ASSISTANT:   ASSISTANTS: none   ANESTHESIA:   local and general  EBL:  Total I/O In: 1000 [I.V.:1000] Out: -   BLOOD ADMINISTERED:none  DRAINS: none   SPECIMEN:  Excision  DISPOSITION OF SPECIMEN:  PATHOLOGY  COUNTS:  YES  DICTATION: Reubin Milan Dictation  Patient presented to the hospital with cholecystitis. She is brought for cholecystectomy. Informed consent was obtained. She received intravenous antibiotics. She was identified in the preop holding area. She was brought to the operating room and general endotracheal anesthesia was administered by the anesthesia staff. Her abdomen was prepped and draped in sterile fashion. Time out procedure was done. Infraumbilical region was infiltrated with local. Infraumbilical incision was made. Subcutaneous tissues were dissected down revealing the anterior fascia. This was divided along the midline. Peritoneal cavity was entered under direct vision. 0 Vicryl pursestring suture was placed on the fascial opening. Hassan trocar was inserted. Abdomen was insufflated with carbon dioxide in standard fashion. Under direct vision a 5 mm epigastric and 2 5 mm Right abdominal ports were placed. Local was used at these port sites. Abdomen the gallbladder was retracted superior medially and the gallbladder was very edematous and inflamed. The infundibulum was retracted inferior laterally. Dissection began laterally and progressed medially easily identifying the cystic duct. Critical view was obtained.Due to the patient's lack of gallstones and some initial bradycardia which resolved cholangiogram was not done. 3 clips were placed proximally on the cystic duct. One clip was  placed distally and it was divided. Cystic duct was identified. This was clipped twice proximally once distally and divided. Gallbladder was taken off the liver bed with Bovie cautery. A posterior branch of cystic artery was encountered and this was clipped twice proximally and once distally and divided.Excellent hemostasis was obtained along with a. Gallbladder was placed in an Endo Catch bag and removed from the abdomen via the infraumbilical port site. Liver bed was copiously irrigated. Cautery was used to get good hemostasis. Clips remain in excellent position. Liver bed was dry. Irrigation fluid returned clear. Ports were removed under direct vision. Pneumoperitoneum was evacuated. Infra-umbilical fascia was closed by tying the 0 Vicryl pursestring suture with care not to trap any intra-abdominal contents.All 4 wounds were copiously irrigated with in the skin of each was closed with running 4 Vicryl followed by Dermabond. All counts were correct. Patient  tolerated procedure well without apparent Complications and she was taken to recovery in stable condition.  PATIENT DISPOSITION:  PACU - hemodynamically stable.   Delay start of Pharmacological VTE agent (>24hrs) due to surgical blood loss or risk of bleeding:  no  Violeta Gelinas, MD, MPH, FACS Pager: [phone]  10/18/20133:10 PM

## 2012-06-20 NOTE — Progress Notes (Signed)
Subjective: Doing better still complains of soreness in her chest and upper abdomen.  Objective: Vital signs in last 24 hours: Temp:  [97.7 F (36.5 C)-99.7 F (37.6 C)] 97.9 F (36.6 C) (10/18 0957) Pulse Rate:  [50-62] 62  (10/18 0957) Resp:  [16-18] 17  (10/18 0957) BP: (117-134)/(56-71) 134/61 mmHg (10/18 0957) SpO2:  [91 %-100 %] 98 % (10/18 0957) Weight:  [80.74 kg (178 lb)] 80.74 kg (178 lb) (10/17 1300) Last BM Date: 06/19/12  Afebrile, VSS, creatinine is better, LFT's up more today  Intake/Output from previous day: 10/17 0701 - 10/18 0700 In: 3577.5 [I.V.:3577.5] Out: -  Intake/Output this shift:    General appearance: alert, cooperative and no distress GI: soft, mildly tender right side. no distension.  Lab Results:   Basename 06/20/12 0540 06/19/12 1030  WBC 6.5 7.3  HGB 11.5* 12.7  HCT 36.8 39.1  PLT 139* 178    BMET  Basename 06/20/12 0540 06/19/12 1030 06/19/12 0453  NA 144 -- 144  K 3.8 -- 3.8  CL 106 -- 101  CO2 28 -- 29  GLUCOSE 105* -- 155*  BUN 19 -- 30*  CREATININE 1.08 1.04 --  CALCIUM 8.2* -- 9.4   PT/INR No results found for this basename: LABPROT:2,INR:2 in the last 72 hours   Lab 06/20/12 0540 06/19/12 0453  AST 434* 23  ALT 229* 20  ALKPHOS 72 73  BILITOT 1.2 0.4  PROT 5.7* 7.2  ALBUMIN 2.9* 3.9     Lipase     Component Value Date/Time   LIPASE 42 06/20/2012 0540     Studies/Results: US Abdomen Complete  06/19/2012  *RADIOLOGY REPORT*  Clinical Data:  Abdominal pain.  Question gallstones.  COMPLETE ABDOMINAL ULTRASOUND  Comparison:  None.  Findings:  Gallbladder:  A small amount of pericholecystic fluid is identified and the gallbladder wall is mildly thickened at 0.4 cm. The gallbladder is also mildly hydropic.  However, no gallstones or sludge are seen.  Sonographer reports negative Murphy's sign. A 0.8 cm polyp is seen in the fundus.  Common bile duct:  Measures up to 0.7 cm.  Liver:  No focal lesion identified.   Within normal limits in parenchymal echogenicity.  IVC:  Appears normal.  Pancreas:  No focal abnormality seen.  Spleen:  Measures 11.1 cm and appears normal.  Right Kidney:  Measures 10.3 cm and appears normal.  Left Kidney:  Measures 10.4 cm and appears normal.  Abdominal aorta:  No aneurysm identified.  IMPRESSION:  1.  Negative for gallstones.  Mild distention of the gallbladder with gallbladder wall thickening and a small left pericholecystic fluid are nonspecific but could be due to acalculous cholecystitis although this is typically seen in critically ill patients . Cholescintigraphy could be used for further evaluation. 2.  0.8 cm gallbladder polyp.   Original Report Authenticated By: Bernadene Bell. Maricela Curet, M.D.     Medications:    . ciprofloxacin  400 mg Intravenous Q12H  . heparin  5,000 Units Subcutaneous Q8H  . influenza  inactive virus vaccine  0.5 mL Intramuscular Tomorrow-1000  . nebivolol  5 mg Oral Daily  . pneumococcal 23 valent vaccine  0.5 mL Intramuscular Tomorrow-1000    Assessment/Plan 1. Acalculous cholecystitis with hydropic gallbladder.  2. Mild renal insufficiency with a history of renal insufficiency earlier in the month.  3. Hypertension  4. Hypothyroid  6. Hyperlipidemia  7. BMI 28   Plan:  Surgery later today, renal insuffiencey is better. LFT's are up some.  LOS: 1 day    Hideo Googe 06/20/2012

## 2012-06-20 NOTE — Anesthesia Postprocedure Evaluation (Signed)
  Anesthesia Post-op Note  Patient: April Beck  Procedure(s) Performed: Procedure(s) (LRB) with comments: LAPAROSCOPIC CHOLECYSTECTOMY (N/A)  Patient Location: PACU  Anesthesia Type: General  Level of Consciousness: awake  Airway and Oxygen Therapy: Patient Spontanous Breathing  Post-op Pain: mild  Post-op Assessment: Post-op Vital signs reviewed  Post-op Vital Signs: Reviewed  Complications: No apparent anesthesia complications

## 2012-06-20 NOTE — Progress Notes (Signed)
Patient examined and I agree with the assessment and plan  Violeta Gelinas, MD, MPH, FACS Pager: [phone]  06/20/2012 6:36 PM

## 2012-06-20 NOTE — Discharge Summary (Signed)
Physician Discharge Summary  Patient ID: April Beck MRN: [id number] DOB/AGE: 1946/05/11 66 y.o.  Admit date: 06/19/2012 Discharge date: 06/20/2012  Admission Diagnoses: 1. Acalculous cholecystitis with hydropic gallbladder.  2. Mild renal insufficiency with a history of renal insufficiency earlier in the month.  3. Hypertension  4. Hypothyroid  6. Hyperlipidemia  7. BMI 28   Discharge Diagnoses:  Same Principal Problem:  *Acalculous cholecystitis Active Problems:  Renal insufficiency  Hypothyroid   PROCEDURES: LAPAROSCOPIC CHOLECYSTECTOMY,Burke E Thompson, MD,06/20/2012    Hospital Course: Patient is a 66 year old female who this morning around 2:30 AM with midepigastric pain going from below her xiphoid down to her umbilicus and across her abdomen. This was followed with nausea and vomiting. She had about 5 episodes in presented to the ER at [facility]. She had further nausea and vomiting here. Her symptoms improved with medications in the ER currently she is pain free and her nausea has resolved.  She recently went [place] to visit her son and had an episode on 06/09/12 she was taken to the emergency room there and evaluated. CT scan was reportedly normal, it was their opinion she had enteritis. Her symptoms resolved in the ER there; she had nausea during that first episode but no vomiting. She was discharged home on what sounds like antivirals and an anti-emetics. She was good first 24 hours after discharge but had another episode on 10/ 9/13. Symptoms last another 2 days. She came home to [facility], and was seen by her primary care physician Dr. Tanya Nones; she reports she had significant renal insufficiency. She's been seen and treated and this has reportedly resolved. She had labs last week which she reports were normal.  Workup in the ER here shows a white count of 11,600. H/H 13.7/41.8. LFTs are normal, lipase is 59. BUN is 30 creatinine is 1.27. Abdominal  ultrasound shows pericholecystic fluid identified in the gallbladder wall was mildly thickened. The gallbladder is moderate mildly hydropic no Murphy sign there is a small 0.8 cm polyps in the fundus. Common bile duct measured 0.7 cm. There was no gallstones or sludge seen in the gallbladder. We are asked in consultation.  She was admitted and hydrated over night.  Her creatinine, improved, and her LFT's increased some.  She underwent above surgery. She has done well over night and we plan to send her home later this AM if she does well with breakfast.   Prior to Admission medications   Medication Sig Start Date End Date Taking? Authorizing Provider  atorvastatin (LIPITOR) 10 MG tablet Take 10 mg by mouth daily.   Yes Historical Provider, MD  nebivolol (BYSTOLIC) 5 MG tablet Take 5 mg by mouth daily.   Yes Historical Provider, MD  acetaminophen (TYLENOL) 325 MG tablet Take 2 tablets (650 mg total) by mouth every 6 (six) hours as needed for pain. 06/21/12   Sherrie George, PA  ibuprofen (ADVIL) 200 MG tablet You can take 2-3 every 6 hours as needed for pain. 06/21/12   Sherrie George, PA  oxyCODONE-acetaminophen (PERCOCET/ROXICET) 5-325 MG per tablet Take 1-2 tablets by mouth every 4 (four) hours as needed (pain). 06/21/12   Sherrie George, PA  polyethylene glycol [facility] / GLYCOLAX) packet Take 17 g by mouth daily as needed. 06/21/12   Sherrie George, PA          Disposition: Final discharge disposition not confirmed     Medication List     As of 06/20/2012 12:28 PM    ASK your  doctor about these medications         atorvastatin 10 MG tablet   Commonly known as: LIPITOR   Take 10 mg by mouth daily.      nebivolol 5 MG tablet   Commonly known as: BYSTOLIC   Take 5 mg by mouth daily.           Follow-up Information    Follow up with [facility] E, MD. Schedule an appointment as soon as possible for a visit in 2 weeks. (Call for an appointment with [facility] or  Dr. Janee Morn in 2-3 weeks.)    Contact information:   [address] [place] [address] [phone]          Signed: Sherrie George 06/20/2012, 12:28 PM

## 2012-06-20 NOTE — Transfer of Care (Signed)
Immediate Anesthesia Transfer of Care Note  Patient: April Beck  Procedure(s) Performed: Procedure(s) (LRB) with comments: LAPAROSCOPIC CHOLECYSTECTOMY (N/A)  Patient Location: PACU  Anesthesia Type: General  Level of Consciousness: awake  Airway & Oxygen Therapy: Patient Spontanous Breathing and Patient connected to face mask oxygen  Post-op Assessment: Report given to PACU RN, Post -op Vital signs reviewed and stable and Patient moving all extremities  Post vital signs: Reviewed and stable  Complications: No apparent anesthesia complications

## 2012-06-20 NOTE — Anesthesia Procedure Notes (Signed)
Procedure Name: Intubation Date/Time: 06/20/2012 2:27 PM Performed by: Luster Landsberg Pre-anesthesia Checklist: Patient identified, Emergency Drugs available, Suction available and Patient being monitored Patient Re-evaluated:Patient Re-evaluated prior to inductionOxygen Delivery Method: Circle system utilized Preoxygenation: Pre-oxygenation with 100% oxygen Intubation Type: IV induction, Rapid sequence and Cricoid Pressure applied Tube type: Oral Tube size: 7.5 mm Number of attempts: 1 Airway Equipment and Method: Video-laryngoscopy and Stylet Placement Confirmation: ETT inserted through vocal cords under direct vision,  positive ETCO2 and breath sounds checked- equal and bilateral Secured at: 22 cm Tube secured with: Tape Dental Injury: Teeth and Oropharynx as per pre-operative assessment

## 2012-06-21 MED ORDER — IBUPROFEN 200 MG PO TABS: ORAL_TABLET | ORAL | Status: DC

## 2012-06-21 MED ORDER — POLYETHYLENE GLYCOL 3350 17 G PO PACK: 17.0000 g | PACK | Freq: Every day | ORAL | Status: DC | PRN

## 2012-06-21 MED ORDER — POLYETHYLENE GLYCOL 3350 17 G PO PACK
17.0000 g | PACK | Freq: Every day | ORAL | Status: DC | PRN
  Filled 2012-06-21: qty 1

## 2012-06-21 MED ORDER — ACETAMINOPHEN 325 MG PO TABS: 650.0000 mg | ORAL_TABLET | Freq: Four times a day (QID) | ORAL | Status: AC | PRN

## 2012-06-21 MED ORDER — OXYCODONE-ACETAMINOPHEN 5-325 MG PO TABS: 1.0000 | ORAL_TABLET | ORAL | Status: DC | PRN

## 2012-06-21 NOTE — Progress Notes (Signed)
1 Day Post-Op  Subjective: Feels pretty good this AM.  Up to BR several times.  Pain well controlled.  Objective: Vital signs in last 24 hours: Temp:  [97.9 F (36.6 C)-98.5 F (36.9 C)] 98.5 F (36.9 C) (10/19 0610) Pulse Rate:  [53-70] 70  (10/19 0610) Resp:  [10-22] 18  (10/19 0610) BP: (94-143)/(47-68) 114/55 mmHg (10/19 0610) SpO2:  [91 %-100 %] 93 % (10/19 0610) Last BM Date: 06/19/12  Diet: regular, afebrile, VSS, no labs  Intake/Output from previous day: 10/18 0701 - 10/19 0700 In: 2616 [P.O.:120; I.V.:2496] Out: -  Intake/Output this shift:    General appearance: alert, cooperative and no distress Resp: clear to auscultation bilaterally GI: soft, a little tender, incisions all look good.  Lab Results:   Basename 06/20/12 0540 06/19/12 1030  WBC 6.5 7.3  HGB 11.5* 12.7  HCT 36.8 39.1  PLT 139* 178    BMET  Basename 06/20/12 0540 06/19/12 1030 06/19/12 0453  NA 144 -- 144  K 3.8 -- 3.8  CL 106 -- 101  CO2 28 -- 29  GLUCOSE 105* -- 155*  BUN 19 -- 30*  CREATININE 1.08 1.04 --  CALCIUM 8.2* -- 9.4   PT/INR No results found for this basename: LABPROT:2,INR:2 in the last 72 hours   Lab 06/20/12 0540 06/19/12 0453  AST 434* 23  ALT 229* 20  ALKPHOS 72 73  BILITOT 1.2 0.4  PROT 5.7* 7.2  ALBUMIN 2.9* 3.9     Lipase     Component Value Date/Time   LIPASE 42 06/20/2012 0540     Studies/Results: No results found.  Medications:    . ciprofloxacin  400 mg Intravenous Q12H  . heparin  5,000 Units Subcutaneous Q8H  . influenza  inactive virus vaccine  0.5 mL Intramuscular Tomorrow-1000  . nebivolol  5 mg Oral Daily  . pneumococcal 23 valent vaccine  0.5 mL Intramuscular Tomorrow-1000    Assessment/Plan gallbladder disease s/p LAPAROSCOPIC CHOLECYSTECTOMY, 06/20/2012, April Malady, MD 1. Acalculous cholecystitis with hydropic gallbladder.  2. Mild renal insufficiency with a history of renal insufficiency earlier in the month.  3.  Hypertension  4. Hypothyroid  6. Hyperlipidemia  7. BMI 28   Plan:  Miralax prn for BM, mobilize, advance diet, home latter today.         LOS: 2 days    April Beck 06/21/2012

## 2012-06-23 NOTE — Discharge Summary (Signed)
April Cumpton, MD, MPH, FACS Pager: [phone]  

## 2012-06-23 NOTE — Progress Notes (Signed)
Agree Zamyia Gowell, MD, MPH, FACS Pager: [phone]  

## 2012-06-24 ENCOUNTER — Encounter (HOSPITAL_COMMUNITY): Payer: Self-pay | Admitting: General Surgery

## 2012-06-24 ENCOUNTER — Ambulatory Visit (INDEPENDENT_AMBULATORY_CARE_PROVIDER_SITE_OTHER): Payer: Medicare Other | Admitting: Specialist

## 2012-06-24 NOTE — ED Provider Notes (Signed)
Medical screening examination/treatment/procedure(s) were performed by non-physician practitioner and as supervising physician I was immediately available for consultation/collaboration.   Gwyneth Sprout, MD 06/24/12 [phone]

## 2012-07-01 ENCOUNTER — Encounter (INDEPENDENT_AMBULATORY_CARE_PROVIDER_SITE_OTHER): Payer: Self-pay

## 2012-07-01 NOTE — Progress Notes (Signed)
I faxed a work note to [employer] [phone]

## 2012-07-01 NOTE — ED Provider Notes (Signed)
Medical screening examination/treatment/procedure(s) were performed by non-physician practitioner and as supervising physician I was immediately available for consultation/collaboration.  Ethelda Chick, MD 07/01/12 [phone]

## 2012-07-16 ENCOUNTER — Encounter (INDEPENDENT_AMBULATORY_CARE_PROVIDER_SITE_OTHER): Payer: Self-pay | Admitting: General Surgery

## 2012-07-16 ENCOUNTER — Ambulatory Visit (INDEPENDENT_AMBULATORY_CARE_PROVIDER_SITE_OTHER): Payer: 59 | Admitting: General Surgery

## 2012-07-16 VITALS — BP 126/76 | HR 58 | Temp 97.6°F | Resp 18 | Ht 66.5 in | Wt 185.8 lb

## 2012-07-16 DIAGNOSIS — K819 Cholecystitis, unspecified: Secondary | ICD-10-CM

## 2012-07-16 NOTE — Progress Notes (Signed)
Subjective:     Patient ID: April Beck, female   DOB: 12/31/1945, 66 y.o.   MRN: [id number]  HPI Patient status postoperative cholecystectomy. She is occasionally having diarrhea after eating. It is inconsistent and not associated with certain types of foods. No abdominal pain or other complaints.  Review of Systems     Objective:   Physical Exam Abdomen soft and nontender. Incisions are all well-healed.    Assessment:    Doing well status post endoscopic cholecystectomy    Plan:     I reviewed her pathology report which showed acute and chronic cholecystitis. I feel the diarrhea after eating should resolve over the next month. She will return as needed.

## 2012-07-24 ENCOUNTER — Encounter (INDEPENDENT_AMBULATORY_CARE_PROVIDER_SITE_OTHER): Payer: Self-pay | Admitting: Specialist

## 2012-07-24 ENCOUNTER — Ambulatory Visit (INDEPENDENT_AMBULATORY_CARE_PROVIDER_SITE_OTHER): Payer: Medicare Other | Admitting: Specialist

## 2012-07-24 VITALS — BP 126/69 | HR 69 | Ht 62.0 in | Wt 181.0 lb

## 2012-07-24 LAB — POCT HEMOGLOBIN A1C: POCT Hgb A1C: 9.7 — AB (ref 3.9–5.9)

## 2012-07-24 MED ORDER — GLUCOSE BLOOD VI STRP: ORAL_STRIP | Status: DC

## 2012-07-24 MED ORDER — ONETOUCH ULTRASOFT LANCETS MISC
Status: DC
Start: 2012-07-24 — End: 2012-08-06

## 2012-07-24 MED ORDER — ONETOUCH ULTRA 2 W/DEVICE KIT: PACK | Status: AC

## 2012-07-24 NOTE — Progress Notes (Addendum)
CHIEF COMPLAINT:  Type 2 diabetes.     HISTORY OF PRESENT ILLNESS:  Ms. Helen [name] is a pleasant 66 year old who presents in our clinic for  Follow up visit. Last evaluated in July 2013.     Ms. Helen [name] tells me she was diagnosed with diabetes approximately 20 years  ago and has no known complications from it.  Her most recent hemoglobin A1c  Is elevated at  9.8%. This has worsened since last A1c of 8.2%.She currently manages her diabetes by taking glipizide 10 mg twice a day.  In addition, she takes starlix 120 mg before meals.She tells me she was on metformin before, but gave her  facial swelling and therefore it was discontinued.     Ms. Helen [name] brings her glucometer to the clinic today, has no recordings in it except am BG of 186 mg/dl..  She tells me  that her fasting blood glucoses typically range between 100 to 120 mg/dL. She also mentioned that her pre meal BG values are in the same range, though her A1C is suggestive of average BG in 200's.  She tells me she has h/o anemia, no recent symptoms of palpitations , shortness of breath, no melena or hematemesis. No h/o transfusions. Do not have a recent CBC .    She is aware of the implications of uncontrolled type 2 diabetes, such as neuropathy, nephropathy, and retinopathy.     Ms. Helen [name] blood pressure in our clinic today is at goal at 126/69.Helen [name]  She  is currently on amlodipine and Diovan with hydrochlorothiazide daily for  her blood pressure.  Recent serum creatinine level done in May of 2013 is  noted to be normal at 0.73.  However, urine microalbumin level was not done  at the time.  I gave a requisition for microalbumin, CMP to be done before this visit which she has not had done yet, she tells me her last labs are from approximately 3-4 months ago done by her PCP.  She has associated hyperlipidemia and is on Zetia 10 mg daily.  No known history of coronary  artery disease.  She denies any paresthesias or numbness in her lower  extremities.  No prior  history of open ulcers, lesions, or amputations.   She denies any history of retinopathy.  She tells me she follows with an  ophthalmologist annually.  Denies any eye symptoms today.     REVIEW OF SYSTEMS:  As mentioned above.  In addition, denies any nausea, vomiting, abdominal  pain, hematuria, or dysuria.  Denies any polyuria, polyphagia, or  polydipsia.  Denies paresthesias or numbness in her lower extremities.   Mood is stable.  All other systems reviewed and were negative.     PAST MEDICAL HISTORY, FAMILY HISTORY AND allergies, Social history were reviewed and updated in epic care.     PHYSICAL EXAMINATION:  VITAL SIGNS:  Blood pressure 126/69  pulse 69  per minute and regular,  weight 181 lbs.  GENERAL:  Pleasant and conversant, in no acute distress.  NECK:  Supple and not enlarged.  Thyroid palpable, enlarged, possible left thyroid nodule?   LUNGS:  Clear to auscultation bilaterally.  No wheezes or rales.  CARDIOVASCULAR:  Regular rate and rhythm.  ABDOMEN:  Soft, nontender.  Bowel sounds heard.  EXTREMITIES:  No edema seen.  SKIN:  Warm and dry.  No rash seen.  PSYCHIATRIC:  Mood and affect normal.     LABORATORY AND DIAGNOSTIC DATA:  Point of care hemoglobin A1c 9.8 %.  May of 2013.  Sodium 139, potassium  3.7, calcium 9.8, AST 21, ALT 20, total protein 7.4.  Creatinine 0.7,  albumin 4.3.  Hemoglobin A1c 9.8%.  Hemoglobin 11.3; hematocrit 36.4;  platelets 249.  Total cholesterol 122, triglycerides 42, LDL 48, HDL 67.   TSH 1.21.     ASSESSMENT AND PLAN:  Ms. Helen [name] is a 66 year old who presents in our clinic for evaluation of  uncontrolled type 2 diabetes.  1.  Glycemic control:  Her hemoglobin A1c has worsened since last visit, do not have BG values to relate to. Per pt BG values are in normal range, therefore encouraged her to check fasting and premeal BG values and bring glucose meter in 2 weeks for evaluation. Gave her a new meter and prescription for  testing supplies today .  Also would like to get  comprehensive panel, lipid profile, urine microalbumin, and thyroid  function tests and cbc done prior to her visit.     2.  Diabetes complications:  A.  Retinopathy:  No known history.  I encouraged her to have annual  retinopathy surveillance exam.  Denies any eye symptoms today.  B.  Neuropathy:  No symptoms of neuropathy.  Discussed diabetic foot hygiene.  C.  Nephropathy and hypertension:  Blood pressure is at goal.  She is  currently on ARB therapy.  I would like to check a urine microalbumin  level.    D.  Hyperlipidemia:  Currently on Zetia and Lipitor.     3. Enlarged thyroid and possible nodule- will get an ultrasound for further evaluation, she denies any symptoms of obstruction in her neck.    It was a pleasure taking care of Ms. Helen [name] in our clinic.  I would like  to see her back in our clinic in 2 weeks .   However, I asked her to contact  us sooner if she were to have symptoms of hypoglycemia or hyperglycemia.

## 2012-07-25 ENCOUNTER — Telehealth (INDEPENDENT_AMBULATORY_CARE_PROVIDER_SITE_OTHER): Payer: Self-pay

## 2012-07-25 ENCOUNTER — Encounter (INDEPENDENT_AMBULATORY_CARE_PROVIDER_SITE_OTHER): Payer: Self-pay

## 2012-07-25 NOTE — Telephone Encounter (Signed)
Yes she will need a f/u ultrasound as last one showed a discrete growth, f/u is needed.

## 2012-07-25 NOTE — Telephone Encounter (Signed)
Patient is scheduled for [place] on Monday 07/28/2012

## 2012-07-25 NOTE — Telephone Encounter (Signed)
Patient forgot to inform Dr. Benjiman Core that she had a thyroid US in 10/2009. Ultrasound report has been scanned into EPIC today. She would like to know if she should still schedule an appointment to have a new ultrasound done.

## 2012-07-28 ENCOUNTER — Telehealth (INDEPENDENT_AMBULATORY_CARE_PROVIDER_SITE_OTHER): Payer: Self-pay

## 2012-07-28 NOTE — Telephone Encounter (Signed)
Pt called wanting to know if we had any samples of test strips in office. She has a supply coming in the mail and a written prescription for some as well, however she says that she can not afford to get them. I informed her we do not have any and that she can try to call the pharmacy and ask if they have samples.

## 2012-07-29 ENCOUNTER — Other Ambulatory Visit (INDEPENDENT_AMBULATORY_CARE_PROVIDER_SITE_OTHER): Payer: Self-pay

## 2012-07-29 MED ORDER — GLUCOSE BLOOD VI STRP
ORAL_STRIP | Status: DC
Start: 2012-07-29 — End: 2012-08-06

## 2012-07-30 ENCOUNTER — Other Ambulatory Visit (INDEPENDENT_AMBULATORY_CARE_PROVIDER_SITE_OTHER): Payer: Self-pay

## 2012-07-30 ENCOUNTER — Ambulatory Visit (INDEPENDENT_AMBULATORY_CARE_PROVIDER_SITE_OTHER): Payer: Medicare Other

## 2012-07-30 DIAGNOSIS — E119 Type 2 diabetes mellitus without complications: Secondary | ICD-10-CM

## 2012-07-30 DIAGNOSIS — IMO0002 Reserved for concepts with insufficient information to code with codable children: Secondary | ICD-10-CM

## 2012-07-30 NOTE — Progress Notes (Signed)
Patient in the office for blood draw only.

## 2012-07-31 LAB — COMPREHENSIVE METABOLIC PANEL
ALT: 18 U/L (ref 6–29)
AST (SGOT): 17 U/L (ref 10–35)
Albumin/Globulin Ratio: 1.4 (ref 1.0–2.5)
Albumin: 4.2 G/DL (ref 3.6–5.1)
Alkaline Phosphatase: 98 U/L (ref 33–130)
BUN: 11 MG/DL (ref 7–25)
Bilirubin, Total: 1.2 MG/DL (ref 0.2–1.2)
CO2: 27 mmol/L (ref 19–30)
Calcium: 9.5 MG/DL (ref 8.6–10.4)
Chloride: 103 mmol/L (ref 98–110)
Creatinine: 0.74 mg/dL (ref 0.50–0.99)
EGFR African American: 98 mL/min/{1.73_m2} (ref >=60)
EGFR: 84 mL/min/{1.73_m2} (ref >=60)
Globulin: 3 G/DL (ref 1.9–3.7)
Glucose: 148 MG/DL — ABNORMAL HIGH (ref 65–99)
Potassium: 4.3 mmol/L (ref 3.5–5.3)
Protein, Total: 7.2 G/DL (ref 6.1–8.1)
Sodium: 140 mmol/L (ref 135–146)

## 2012-07-31 LAB — CBC AND DIFFERENTIAL
Atypical Lymphocytes %: 0 %
Baso(Absolute): 34 cells/uL (ref 0–200)
Basophils: 0.7 %
Eosinophils Absolute: 106 cells/uL (ref 15–500)
Eosinophils: 2.2 %
Hematocrit: 35.5 % (ref 35.0–45.0)
Hemoglobin: 11.2 g/dL — ABNORMAL LOW (ref 11.7–15.5)
Lymphocytes Absolute: 1896 cells/uL (ref 850–3900)
Lymphocytes: 39.5 %
MCH: 22.9 pg — ABNORMAL LOW (ref 27–33)
MCHC: 31.7 g/dL — ABNORMAL LOW (ref 32–36)
MCV: 72 fL — ABNORMAL LOW (ref 80–100)
MPV: 9.1 fL (ref 7.5–11.5)
Monocytes Absolute: 595 cells/uL (ref 200–950)
Monocytes: 12.4 %
Neutrophils Absolute: 2170 cells/uL (ref 1500–7800)
Neutrophils: 45.2 %
Platelets: 238 10*3/uL (ref 140–400)
RBC: 4.91 10*6/uL (ref 3.80–5.10)
RDW: 13.7 % (ref 11.0–15.0)
WBC: 4.8 10*3/uL (ref 3.8–10.8)

## 2012-07-31 LAB — LIPID PANEL
Cholesterol / HDL Ratio: 2.1 (ref 0.0–5.0)
Cholesterol: 122 MG/DL — ABNORMAL LOW (ref 125–200)
HDL: 59 MG/DL (ref >=46)
LDL Calculated: 50 MG/DL (ref <130)
Non HDL Cholesterol (LDL and VLDL): 63 mg/dL
Triglycerides: 65 MG/DL (ref <150)

## 2012-07-31 LAB — TSH: TSH: 1.26 mIU/L (ref 0.40–4.50)

## 2012-07-31 LAB — MICROALBUMIN, RANDOM URINE
Creatinine, UR: 63 mg/dL (ref 20–320)
Microalbumin MCG/MG Creatinine: 11.1 mcg/mg crea
Microalbumin: 0.7 mg/dL

## 2012-08-04 ENCOUNTER — Encounter (INDEPENDENT_AMBULATORY_CARE_PROVIDER_SITE_OTHER): Payer: Self-pay

## 2012-08-06 ENCOUNTER — Other Ambulatory Visit (INDEPENDENT_AMBULATORY_CARE_PROVIDER_SITE_OTHER): Payer: Self-pay

## 2012-08-06 DIAGNOSIS — IMO0002 Reserved for concepts with insufficient information to code with codable children: Secondary | ICD-10-CM

## 2012-08-06 MED ORDER — GLUCOSE BLOOD VI STRP
ORAL_STRIP | Status: DC
Start: 2012-08-06 — End: 2020-05-12

## 2012-08-06 MED ORDER — ONETOUCH ULTRASOFT LANCETS MISC
Status: DC
Start: 2012-08-06 — End: 2020-05-12

## 2012-08-13 ENCOUNTER — Encounter (INDEPENDENT_AMBULATORY_CARE_PROVIDER_SITE_OTHER): Payer: Medicare Other | Admitting: Specialist

## 2012-08-13 ENCOUNTER — Encounter (INDEPENDENT_AMBULATORY_CARE_PROVIDER_SITE_OTHER): Payer: Self-pay | Admitting: Specialist

## 2012-08-15 NOTE — Progress Notes (Signed)
Subjective:       Patient ID: Helen [name] is a 66 y.o. female.    HPI      Review of Systems        Objective:    Physical Exam        Assessment:             Plan:

## 2012-09-04 ENCOUNTER — Ambulatory Visit (INDEPENDENT_AMBULATORY_CARE_PROVIDER_SITE_OTHER): Payer: Medicare Other | Admitting: Specialist

## 2012-09-04 ENCOUNTER — Encounter (INDEPENDENT_AMBULATORY_CARE_PROVIDER_SITE_OTHER): Payer: Self-pay | Admitting: Specialist

## 2012-09-04 VITALS — BP 142/71 | Wt 183.2 lb

## 2012-09-04 DIAGNOSIS — IMO0002 Reserved for concepts with insufficient information to code with codable children: Secondary | ICD-10-CM

## 2012-09-04 DIAGNOSIS — I10 Essential (primary) hypertension: Secondary | ICD-10-CM

## 2012-09-04 DIAGNOSIS — IMO0001 Reserved for inherently not codable concepts without codable children: Secondary | ICD-10-CM

## 2012-09-04 DIAGNOSIS — E785 Hyperlipidemia, unspecified: Secondary | ICD-10-CM

## 2012-09-04 MED ORDER — SITAGLIPTIN PHOSPHATE 100 MG PO TABS
100.0000 mg | ORAL_TABLET | Freq: Every day | ORAL | Status: AC
Start: 2012-09-04 — End: 2013-09-04

## 2012-09-04 MED ORDER — GLIPIZIDE 10 MG PO TABS
10.0000 mg | ORAL_TABLET | Freq: Two times a day (BID) | ORAL | Status: DC
Start: 2012-09-04 — End: 2013-09-04

## 2012-09-06 ENCOUNTER — Encounter (INDEPENDENT_AMBULATORY_CARE_PROVIDER_SITE_OTHER): Payer: Self-pay | Admitting: Specialist

## 2012-09-06 NOTE — Progress Notes (Signed)
CHIEF COMPLAINT:  Type 2 diabetes.     HISTORY OF PRESENT ILLNESS:  Helen [name] is a pleasant 67 year old who presents in our clinic for  Follow up visit.     Helen [name] tells me she was diagnosed with diabetes approximately 20 years  ago and has no known complications from it.  Her most recent hemoglobin A1c  Is elevated at  9.7%.She currently manages her diabetes by taking glipizide 10 mg twice a day.  In addition, she takes starlix 120 mg before meals.She tells me she was on metformin before, but gave her facial swelling and therefore it was discontinued.     Helen [name] brings her glucometer to the clinic today,Fasting BG range between 120-160 mg/dl, I suspect she has post prandial hyperglycemia. She has not checked any other BG values as advised during the day at her last visit.  She is aware of the implications of uncontrolled type 2 diabetes, such as neuropathy, nephropathy, and retinopathy.     Helen [name] type 2 diabetes is associated with hypertension,blood pressure in our clinic today is not at goal but elevated at 142/71.  She  is currently on amlodipine and Diovan with hydrochlorothiazide daily for  her blood pressure.  Recent serum creatinine level is normal. She has hyperlipidemia associated with her type 2 diabetes. Recent LDL is at goal , she takes zetia and lipitor.  She does not have microvascular complication of nephropathy, no microalbuminuria. She does not have microvascular complication of neuropathy, She denies any paresthesias or numbness in her lower extremities.  No complications of open ulcers, lesions, or amputations.     She denies any microvascular complication in her eyes such as  retinopathy.  She tells me she follows with an ophthalmologist annually.  Denies any eye symptoms today.       REVIEW OF SYSTEMS:  As mentioned above.  In addition, denies any nausea, vomiting, abdominal  pain, hematuria, or dysuria.  Denies any polyuria, polyphagia, or  polydipsia.  Denies paresthesias  or numbness in her lower extremities.   Mood is stable.  All other systems reviewed and were negative.     PAST MEDICAL HISTORY, FAMILY HISTORY AND allergies, Social history were reviewed and updated in epic care.     PHYSICAL EXAMINATION:  VITAL SIGNS:  Blood pressure 142/71, pulse 85  per minute and regular,  weight 183 lbs.  GENERAL:  Pleasant and conversant, in no acute distress.  EXTREMITIES:  No edema seen.  SKIN:  Warm and dry.  No rash seen.  NEURO- alert, oriented x3, no tremors seen.  PSYCHIATRIC:  Mood and affect normal.     LABORATORY AND DIAGNOSTIC DATA:  Nov 2013   hemoglobin A1c 9.7 %.   Creatinine 0.7,  ,LDL 55  Urine microalbumin 0.7  TSH 1.21.     ASSESSMENT AND PLAN:  Helen [name] is a 67 year old who presents in our clinic for evaluation of  uncontrolled type 2 diabetes.  1.  Glycemic control:  Her hemoglobin A1c shows poor control, BG log shows some fasting hyperglycemia, I suspect she also has post prandial hyperglycemia.  I would like to consider GLP-1 agonist Byetta or Insulin therapy to improve glycemic control. She is very reluctant for injectable treat,ment at this time.  DDP-4 inhibitor, Januvia may be another choice, discussed the side effects of pancreatitis. She is willing to consider and will add it to her regimen.  However, I recommended strictly adhering to a diabetic diet and regular exercise to improve glycemic  control.  Will repeat HbA1c in 4 months.     2.  Diabetes complications:  A.  Retinopathy:  No known history.  I encouraged her to have annual  retinopathy surveillance exam.  Denies any eye symptoms today.  B.  Neuropathy:  No symptoms of neuropathy.  Discussed diabetic foot hygiene.  C.  Nephropathy and hypertension:  Blood pressure is not at goal.  She is  currently on ARB therapy. encouraged her to keep a log at home.  D.  Hyperlipidemia:  Currently on Zetia and Lipitor. LDL is at goal.    3. Thyroid nodule- small, no suspicious features, will f/u once a year.    It was  a pleasure taking care of Helen [name] in our clinic.  I would like  to see her back in our clinic in 4 months with repeat HbA1c, CMP, lipid profile,urine microalbumin.

## 2012-09-08 ENCOUNTER — Encounter (INDEPENDENT_AMBULATORY_CARE_PROVIDER_SITE_OTHER): Payer: Self-pay

## 2012-09-11 ENCOUNTER — Telehealth (INDEPENDENT_AMBULATORY_CARE_PROVIDER_SITE_OTHER): Payer: Self-pay

## 2012-09-11 NOTE — Telephone Encounter (Signed)
Pt. Stated she is already being treated for anemia. I sent her a copy of the labs to take to her PCP.

## 2012-10-10 ENCOUNTER — Emergency Department: Payer: Medicare Other

## 2012-10-10 ENCOUNTER — Emergency Department
Admission: EM | Admit: 2012-10-10 | Discharge: 2012-10-10 | Disposition: A | Discharge status: Home / Self Care | Payer: Medicare Other | Attending: Emergency Medicine | Admitting: Emergency Medicine

## 2012-10-10 DIAGNOSIS — E119 Type 2 diabetes mellitus without complications: Secondary | ICD-10-CM | POA: Insufficient documentation

## 2012-10-10 DIAGNOSIS — S92919A Unspecified fracture of unspecified toe(s), initial encounter for closed fracture: Secondary | ICD-10-CM | POA: Insufficient documentation

## 2012-10-10 DIAGNOSIS — I10 Essential (primary) hypertension: Secondary | ICD-10-CM | POA: Insufficient documentation

## 2012-10-10 DIAGNOSIS — W2203XA Walked into furniture, initial encounter: Secondary | ICD-10-CM | POA: Insufficient documentation

## 2012-10-10 NOTE — ED Provider Notes (Signed)
EMERGENCY DEPARTMENT HISTORY AND PHYSICAL EXAM     Physician/Midlevel provider first contact with patient: 10/10/12 1105         Date: 10/10/2012  Patient Name: Helen [name]    History of Presenting Illness     Chief Complaint   Patient presents with   . Foot Injury       History Provided By: Pt    Chief Complaint: Foot injury   Onset: sudden   Timing: waxing/waning x 1900 yesterday   Location: L foot   Severity: moderate   Modifying Factors: onset after stubbing toe on chair   Associated Symptoms: Denies LOC     Additional History: Helen [name] is a 66 y.o. female presents to the ED for sudden onset, waxing/waning L foot pain x 1900 yesterday s/p hitting foot on metal chair. No fall, progressive pain with movement.    Denies fall, LOC, pain meds     PCP: Randolph Bing, MD      No current facility-administered medications for this encounter.     Current Outpatient Prescriptions   Medication Sig Dispense Refill   . albuterol (PROVENTIL) (2.5 MG/3ML) 0.083% nebulizer solution Take 2.5 mg by nebulization every 6 (six) hours as needed.       [name] amLODIPine (NORVASC) 10 MG tablet Take 10 mg by mouth daily.       [name] atorvastatin (LIPITOR) 40 MG tablet Take 40 mg by mouth daily.       . Blood Glucose Monitoring Suppl (ONE TOUCH ULTRA 2) W/DEVICE KIT One daily  1 each  0   . ezetimibe (ZETIA) 10 MG tablet Take 10 mg by mouth daily.       [name] glipiZIDE (GLUCOTROL) 10 MG tablet Take 1 tablet (10 mg total) by mouth 2 (two) times daily.  180 tablet  3   . glucose blood (ONE TOUCH ULTRA TEST) test strip Use as instructed  100 each  5   . glucose blood (ONE TOUCH ULTRA TEST) test strip 2 times daily  200 each  3   . Lancets (ONETOUCH ULTRASOFT) lancets Use to check blood sugars 2 times daily  200 each  3   . nateglinide (STARLIX) 120 MG tablet Take 120 mg by mouth 3 (three) times daily before meals.       . pantoprazole (PROTONIX) 40 MG tablet Take 40 mg by mouth daily.       . sitaGLIPtin (JANUVIA) 100 MG tablet Take 1 tablet  (100 mg total) by mouth daily.  90 tablet  1   . valsartan-hydrochlorothiazide (DIOVAN-HCT) 160-12.5 MG per tablet Take 1 tablet by mouth daily.           Past History     Past Medical History:  Past Medical History   Diagnosis Date   . Cancer      breast cancer   . Hyperlipidemia    . Hypertensive disorder    . Diabetes mellitus type II        Past Surgical History:  History reviewed. No pertinent past surgical history.    Family History:  No family history on file.    Social History:  History   Substance Use Topics   . Smoking status: Never Smoker    . Smokeless tobacco: Never Used   . Alcohol Use: No       Allergies:  Allergies   Allergen Reactions   . Aspirin      Breat out in sweat and dizziness  Review of Systems     Review of Systems   Constitutional: Negative for fever.   HENT: Negative for congestion.    Eyes: Negative for blurred vision.   Respiratory: Negative for shortness of breath.    Cardiovascular: Negative for chest pain.   Gastrointestinal: Negative for abdominal pain.   Genitourinary: Negative for dysuria.   Musculoskeletal: Positive for joint pain. Negative for falls.        + foot pain    Skin: Negative for rash.   Neurological: Negative for loss of consciousness.   Psychiatric/Behavioral: The patient is not nervous/anxious.          Physical Exam   BP 132/60  Pulse 84  Resp 16  Ht 1.575 m  Wt 73.936 kg  BMI 29.81 kg/m2  SpO2 98%    Physical Exam   Constitutional: She is oriented to person, place, and time and well-developed, well-nourished, and in no distress. No distress.   HENT:   Head: Normocephalic and atraumatic.   Eyes: Pupils are equal, round, and reactive to light.   Neck: Normal range of motion.   Pulmonary/Chest: Effort normal. No respiratory distress.   Musculoskeletal: She exhibits tenderness. She exhibits no edema.        Tenderness to left 2nd and 3rd toe, limited ROM secondary to pain, normal capillary refill, +2 radial pulse   Neurological: She is alert and oriented  to person, place, and time. Gait normal. GCS score is 15.   Skin: Skin is warm and dry. No rash noted. She is not diaphoretic. No erythema.   Psychiatric: Mood, memory, affect and judgment normal.         Diagnostic Study Results     Labs -     Results     ** No Results found for the last 24 hours. **          Radiologic Studies -   Radiology Results (24 Hour)     Procedure Component Value Units Date/Time    Toe Left 2 + Vw [[id number]] Collected:10/10/12 1127    Order Status:Completed  Updated:10/10/12 1139    Narrative:    HISTORY: Toe pain  AP ,lateral, oblique views demonstrate a nondisplaced oblique fracture  of the mid shaft of the third proximal phalanx. 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      Medical Decision Making   I am the first provider for this patient.    I reviewed the vital signs, available nursing notes, past medical history, past surgical history, family history and social history.    Vital Signs-Reviewed the patient's vital signs.     Patient Vitals for the past 12 hrs:   BP Pulse Resp   10/10/12 1057 132/60 mmHg 84  16        Pulse Oximetry Analysis - Normal 98% on RA    Old Medical Records: Nursing notes.     ED Course:     11:11 AM- pt offered pain medications- denied.     11:46 AM- Pt resting comfortably. Discussed treatment plan, return precautions, and all test results with pt. Pt expresses understanding and agrees to follow up. All questions and concerns addressed at this time. Still denies pain meds.       Provider Notes: Pt presents with phalanx fracture, placed in buddy tape, pt did not want any pain medications. Stable  for discharge, f/up with podiatry    Procedures:    Core Measures:    Critical Care Time:     Diagnosis     Clinical Impression:   1. Phalanx fracture, foot        _______________________________    Attestations:  This note is prepared by Arvilla Meres, acting as Scribe for French Ana,  MD    French Ana, MD- The scribe's documentation has been prepared under my direction and personally reviewed by me in its entirety.  I confirm that the note above accurately reflects all work, treatment, procedures, and medical decision making performed by me.    _______________________________          French Ana, MD  10/10/12 2056

## 2012-10-10 NOTE — Discharge Instructions (Signed)
Phalanx Fracture, Toe    You have been diagnosed with a fracture of your toe.    Your big toe has 2 bones. The other toes each have 3 bones.    A fracture is a break in a bone. It means the same thing as saying a "broken bone." In general fractures heal over about 6-8 weeks. The broken bone will eventually become stronger at the site of the break than in the surrounding area. Most toe fractures can be managed with a dynamic splint (buddy taping). In a few poorly aligned fractures, especially involving the big toe, a cast or even surgery may be required.    Caring for a fracture usually involves medication to reduce pain, the use of a splint or cast to reduce movement, and Resting, Icing, Compressing and Elevating the injured area. Remember this as "RICE."   REST : Limit the use of the injured body part.   ICE: By applying ice to the affected area, swelling and pain can be reduced. Place some ice cubes in a re-sealable (Ziploc) bag and add some water. Put a thin washcloth between the bag and the skin. Apply the ice bag to the area for at least 20 minutes. Do this at least 4 times per day. Using the ice for longer times and more frequently is OK. NEVER APPLY ICE DIRECTLY TO THE SKIN.   COMPRESS: Compression means to apply pressure around the injured area such as with a splint, cast or an ace bandage. Compression decreases swelling and improves comfort. Compression should be tight enough to relieve swelling but not so tight as to decrease circulation. Increasing pain, numbness, tingling, or change in skin color, are all signs of decreased circulation.   ELEVATE: Elevate the injured part. For example, a leg can be elevated by placing the leg on a chair while sitting and propped up on pillows while lying down.    You have been placed in a dynamic splint. This type of splinting is sometimes called "buddy taping." This involves taping the injured toe next to the uninjured one. This allows for maximum use of the  foot and helps to prevent stiffness by allowing for movement. Use the following guidelines for caring for your injury.   Remove the buddy tape every 2-3 days and replace it with fresh tape. Be sure to put some cotton or soft gauze between the toes before taping them together.   Use the buddy taping for at least 2-3 weeks.   Wear a shoe with a hard sole for a while to improve comfort. The hard shoe will limit the amount of bending at the fracture site. You can buy a "post operative or "cast" shoe at a medical supply store.    YOU SHOULD SEEK MEDICAL ATTENTION IMMEDIATELY, EITHER HERE OR AT THE NEAREST EMERGENCY DEPARTMENT, IF ANY OF THE FOLLOWING OCCURS:   You experience a severe increase in pain or swelling in the affected area.   You develop new numbness and tingling in or below the affected area.   You develop a cold, pale toe that appears to have a problem with its blood supply.    Return to the nearest Emergency Room if your symptoms worsen or don't improve.

## 2012-10-10 NOTE — ED Notes (Signed)
L 3rd toe injury after trip and fall yesterday

## 2012-11-11 ENCOUNTER — Ambulatory Visit (INDEPENDENT_AMBULATORY_CARE_PROVIDER_SITE_OTHER): Payer: Medicare Other | Admitting: Endocrinology, Diabetes and Metabolism

## 2012-11-24 ENCOUNTER — Telehealth: Payer: Self-pay | Admitting: Family Medicine

## 2012-11-24 NOTE — Telephone Encounter (Signed)
Hard to telll without seeing it.  Stop all ear rings for 2 weeks.  See if it improves.  May need to see plastic surgery.

## 2012-11-24 NOTE — Telephone Encounter (Signed)
Pt aware and will contact plastic surgeon

## 2012-12-29 ENCOUNTER — Telehealth: Payer: Self-pay | Admitting: Family Medicine

## 2012-12-29 MED ORDER — CITALOPRAM HYDROBROMIDE 20 MG PO TABS: 20.0000 mg | ORAL_TABLET | Freq: Every day | ORAL | Status: DC

## 2012-12-29 NOTE — Telephone Encounter (Signed)
Rx Refilled  

## 2013-01-16 ENCOUNTER — Other Ambulatory Visit: Payer: Self-pay | Admitting: Family Medicine

## 2013-01-16 NOTE — Telephone Encounter (Signed)
Medication refilled per protocol. 

## 2013-02-12 ENCOUNTER — Ambulatory Visit (INDEPENDENT_AMBULATORY_CARE_PROVIDER_SITE_OTHER): Payer: Medicare Other | Admitting: Specialist

## 2013-02-12 ENCOUNTER — Encounter (INDEPENDENT_AMBULATORY_CARE_PROVIDER_SITE_OTHER): Payer: Self-pay | Admitting: Specialist

## 2013-02-12 ENCOUNTER — Telehealth (INDEPENDENT_AMBULATORY_CARE_PROVIDER_SITE_OTHER): Payer: Self-pay

## 2013-02-12 VITALS — BP 129/81 | HR 80 | Wt 180.6 lb

## 2013-02-12 NOTE — Progress Notes (Signed)
CHIEF COMPLAINT:  Type 2 diabetes.     HISTORY OF PRESENT ILLNESS:  Helen [name] is a pleasant 67 year old who presents in our clinic for  Follow up visit.     Helen [name] tells me she was diagnosed with diabetes approximately 20 years  ago and has no known complications from it.  Her most recent hemoglobin A1c  Is elevated at  8.8%.She currently manages her diabetes by taking glipizide 10 mg twice a day.  In addition, she takes starlix 120 mg before meals.  Januvia was started last visit.She tells me she was on metformin before, but gave her facial swelling and therefore it was discontinued.     Helen [name] brings her glucometer to the clinic today,Fasting BG range between 120-160 mg/dl, I suspect she has post prandial hyperglycemia. She has not checked any other BG values as advised during the day at her last visit.  She is aware of the implications of uncontrolled type 2 diabetes, such as neuropathy, nephropathy, and retinopathy.     Helen [name] type 2 diabetes is associated with hypertension,blood pressure in our clinic today is  at goal 129/81. She  is currently on amlodipine and Diovan for her blood pressure.  Recent serum creatinine level is normal. She has hyperlipidemia associated with her type 2 diabetes. Recent LDL is at goal , she takes zetia and lipitor.  She does not have microvascular complication of nephropathy, no microalbuminuria. She does not have microvascular complication of neuropathy, She denies any paresthesias or numbness in her lower extremities.  No complications of open ulcers, lesions, or amputations.     She denies any microvascular complication in her eyes such as  retinopathy.  She tells me she follows with an ophthalmologist annually.  Denies any eye symptoms today.       REVIEW OF SYSTEMS:  As mentioned above.  In addition, denies any nausea, vomiting, abdominal  pain, hematuria, or dysuria.  Denies any polyuria, polyphagia, or  polydipsia.  Denies paresthesias or numbness in her  lower extremities.   Mood is stable.  All other systems reviewed and were negative.     PAST MEDICAL HISTORY, FAMILY HISTORY AND allergies, Social history were reviewed and updated in epic care.     PHYSICAL EXAMINATION:  VITAL SIGNS:  Blood pressure 129/81, pulse 80  per minute and regular,  weight 180 lbs.  GENERAL:  Pleasant and conversant, in no acute distress.  EXTREMITIES:  No edema seen.  SKIN:  Warm and dry.  No rash seen.  NEURO- alert, oriented x3, no tremors seen.  PSYCHIATRIC:  Mood and affect normal.     LABORATORY AND DIAGNOSTIC DATA:  June 2014   hemoglobin A1c 8.8%   Creatinine 0.8,  ,LDL 42       ASSESSMENT AND PLAN:  Helen [name] is a 67 year old who presents in our clinic for evaluation of  uncontrolled type 2 diabetes.  1.  Glycemic control:  Her hemoglobin A1c shows poor control,  I suspect she also has post prandial hyperglycemia.  I would like to consider GLP-1 agonist Byetta or Insulin therapy to improve glycemic control. She is very reluctant for injectable treatment . I have had several discussion with her regarding starting injectable therapy. She tells me she discussed this with her PCP and was told since her glycemic control has improved, she will not need it at this time.   She has been made aware of the implications from uncontrolled type 2 diabetes such as neuropathy, nephropathy and  retinopathy.  However, I recommended strictly adhering to a diabetic diet and regular exercise to improve glycemic control as well.     2.  Diabetes complications:  A.  Retinopathy:  No known history.  I encouraged her to have annual  retinopathy surveillance exam.  Denies any eye symptoms today.  B.  Neuropathy:  No symptoms of neuropathy.  Discussed diabetic foot hygiene.  C.  Nephropathy and hypertension:  Blood pressure is at goal.  She is  currently on ARB therapy.D.  Hyperlipidemia:  Currently on Zetia and Lipitor. LDL is at goal.    3. Thyroid nodule- small, no suspicious features, f/u yearly.    It  was a pleasure taking care of Helen [name] in our clinic.  She will need additional therapy for glycemic control, unwilling to do so. Aware of the microvascular and macrovascular complications from uncontrolled diabetes. Will defer further f/u with PCP. Will be glad to see her if she changes her mind or has questions.    to see her back in our clinic in 4 months with repeat HbA1c, CMP, lipid profile,urine microalbumin.

## 2013-02-26 ENCOUNTER — Encounter: Payer: Self-pay | Admitting: Physician Assistant

## 2013-02-26 ENCOUNTER — Ambulatory Visit (INDEPENDENT_AMBULATORY_CARE_PROVIDER_SITE_OTHER): Payer: 59 | Admitting: Physician Assistant

## 2013-02-26 ENCOUNTER — Other Ambulatory Visit (INDEPENDENT_AMBULATORY_CARE_PROVIDER_SITE_OTHER): Payer: Self-pay

## 2013-02-26 ENCOUNTER — Telehealth: Payer: Self-pay | Admitting: Family Medicine

## 2013-02-26 ENCOUNTER — Encounter (INDEPENDENT_AMBULATORY_CARE_PROVIDER_SITE_OTHER): Payer: Self-pay

## 2013-02-26 VITALS — BP 142/90 | HR 56 | Temp 97.7°F | Resp 18 | Ht 66.5 in | Wt 195.0 lb

## 2013-02-26 DIAGNOSIS — I10 Essential (primary) hypertension: Secondary | ICD-10-CM

## 2013-02-26 DIAGNOSIS — R600 Localized edema: Secondary | ICD-10-CM

## 2013-02-26 DIAGNOSIS — E785 Hyperlipidemia, unspecified: Secondary | ICD-10-CM

## 2013-02-26 DIAGNOSIS — R609 Edema, unspecified: Secondary | ICD-10-CM

## 2013-02-26 DIAGNOSIS — Z79899 Other long term (current) drug therapy: Secondary | ICD-10-CM

## 2013-02-26 LAB — CBC WITH DIFFERENTIAL/PLATELET
Basophils Absolute: 0 10*3/uL (ref 0.0–0.1)
Basophils Relative: 0 % (ref 0–1)
Eosinophils Absolute: 0.1 10*3/uL (ref 0.0–0.7)
Eosinophils Relative: 3 % (ref 0–5)
HCT: 37.3 % (ref 36.0–46.0)
Hemoglobin: 12.6 g/dL (ref 12.0–15.0)
Lymphocytes Relative: 53 % — ABNORMAL HIGH (ref 12–46)
Lymphs Abs: 2.5 10*3/uL (ref 0.7–4.0)
MCH: 30 pg (ref 26.0–34.0)
MCHC: 33.8 g/dL (ref 30.0–36.0)
MCV: 88.8 fL (ref 78.0–100.0)
Monocytes Absolute: 0.4 10*3/uL (ref 0.1–1.0)
Monocytes Relative: 9 % (ref 3–12)
Neutro Abs: 1.7 10*3/uL (ref 1.7–7.7)
Neutrophils Relative %: 35 % — ABNORMAL LOW (ref 43–77)
Platelets: 183 10*3/uL (ref 150–400)
RBC: 4.2 MIL/uL (ref 3.87–5.11)
RDW: 13.6 % (ref 11.5–15.5)
WBC: 4.8 10*3/uL (ref 4.0–10.5)

## 2013-02-26 LAB — COMPREHENSIVE METABOLIC PANEL
ALT: 19 U/L (ref 0–35)
AST: 21 U/L (ref 0–37)
Albumin: 4.1 g/dL (ref 3.5–5.2)
Alkaline Phosphatase: 64 U/L (ref 39–117)
BUN: 17 mg/dL (ref 6–23)
CO2: 31 mEq/L (ref 19–32)
Calcium: 9.5 mg/dL (ref 8.4–10.5)
Chloride: 104 mEq/L (ref 96–112)
Creat: 1.02 mg/dL (ref 0.50–1.10)
Glucose, Bld: 97 mg/dL (ref 70–99)
Potassium: 4.4 mEq/L (ref 3.5–5.3)
Sodium: 141 mEq/L (ref 135–145)
Total Bilirubin: 0.8 mg/dL (ref 0.3–1.2)
Total Protein: 6.4 g/dL (ref 6.0–8.3)

## 2013-02-26 LAB — LIPID PANEL
Cholesterol: 137 mg/dL (ref 0–200)
HDL: 36 mg/dL — ABNORMAL LOW (ref >39)
LDL Cholesterol: 62 mg/dL (ref 0–99)
Total CHOL/HDL Ratio: 3.8 Ratio
Triglycerides: 196 mg/dL — ABNORMAL HIGH (ref <150)
VLDL: 39 mg/dL (ref 0–40)

## 2013-02-26 MED ORDER — CITALOPRAM HYDROBROMIDE 20 MG PO TABS: 20.0000 mg | ORAL_TABLET | Freq: Every day | ORAL | Status: DC

## 2013-02-26 MED ORDER — POTASSIUM CHLORIDE ER 10 MEQ PO TBCR: 10.0000 meq | EXTENDED_RELEASE_TABLET | Freq: Two times a day (BID) | ORAL | Status: DC

## 2013-02-26 MED ORDER — HYDROCHLOROTHIAZIDE 25 MG PO TABS: 25.0000 mg | ORAL_TABLET | Freq: Every day | ORAL | Status: DC

## 2013-02-26 NOTE — Telephone Encounter (Signed)
Rx Refilled  

## 2013-02-26 NOTE — Progress Notes (Signed)
Patient ID: April Beck MRN: [id number], DOB: 05-26-1946, 67 y.o. Date of Encounter: 02/26/2013, 10:40 AM    Chief Complaint:  Chief Complaint  Patient presents with  . swelling in lower ext.    had routine labs drawn this am     HPI: 67 y.o. year old white female here for evaluation of LE edema. Her LOV here was 06/26/12 with Dr. Tanya Nones. She had appt to f/u with him in 2 days. She came today to have lab work done in preparation for that appt. While here, she c/o to staff about LE edema. She was added to my schedule to eval this.   Says she has had some swelling for 2-3 weeks but it has been much worse for past 2 days. When she first wakes up, has no swelling but after up for just 2 hours, swelling begins.  Has not consumed increased sodium. Eats no canned foods, canned soups, frozen dinners.  Has had no SOB or cough.   She adds that she used to be on fluid pills in past.   Home Meds: See attached medication section for any medications that were entered at today's visit. The computer does not put those onto this list.The following list is a list of meds entered prior to today's visit.   Current Outpatient Prescriptions on File Prior to Visit  Medication Sig Dispense Refill  . acetaminophen (TYLENOL) 325 MG tablet Take 2 tablets (650 mg total) by mouth every 6 (six) hours as needed for pain.      . citalopram (CELEXA) 20 MG tablet Take 1 tablet (20 mg total) by mouth daily.  30 tablet  3  . ibuprofen (ADVIL) 200 MG tablet You can take 2-3 every 6 hours as needed for pain.  30 tablet  0  . levothyroxine (SYNTHROID, LEVOTHROID) 25 MCG tablet       . nebivolol (BYSTOLIC) 5 MG tablet Take 5 mg by mouth daily.      [name] omeprazole (PRILOSEC) 20 MG capsule       . atorvastatin (LIPITOR) 40 MG tablet TAKE 1 TABLET BY MOUTH EVERY DAY  30 tablet  5  . polyethylene glycol (MIRALAX / GLYCOLAX) packet Take 17 g by mouth daily as needed.  14 each     No current facility-administered  medications on file prior to visit.    Allergies: No Known Allergies    Review of Systems: See HPI for pertinent ROS. All other ROS negative.    Physical Exam: Blood pressure 142/90, pulse 56, temperature 97.7 F (36.5 C), temperature source Oral, resp. rate 18, height 5' 6.5" (1.689 m), weight 195 lb (88.451 kg)., Body mass index is 31.01 kg/(m^2). General: Overweight WF. Appears in no acute distress. Neck: Supple. No thyromegaly. No lymphadenopathy. Lungs: Clear bilaterally to auscultation without wheezes, rales, or rhonchi. Breathing is unlabored. Heart: Regular rhythm. No murmurs, rubs, or gallops. Msk:  Strength and tone normal for age. Extremities/Skin: 1+ LE edema of bilateral feet and up half way of calves.  Neuro: Alert and oriented X 3. Moves all extremities spontaneously. Gait is normal. CNII-XII grossly in tact. Psych:  Responds to questions appropriately with a normal affect.     ASSESSMENT AND PLAN:  67 y.o. year old female with  1. Bilateral lower extremity edema - hydrochlorothiazide (HYDRODIURIL) 25 MG tablet; Take 1 tablet (25 mg total) by mouth daily.  Dispense: 30 tablet; Refill: 0 - potassium chloride (K-DUR) 10 MEQ tablet; Take 1 tablet (10 mEq total) by  mouth 2 (two) times daily.  Dispense: 30 tablet; Refill: 0  2. Essential hypertension, benign - hydrochlorothiazide (HYDRODIURIL) 25 MG tablet; Take 1 tablet (25 mg total) by mouth daily.  Dispense: 30 tablet; Refill: 0 - potassium chloride (K-DUR) 10 MEQ tablet; Take 1 tablet (10 mEq total) by mouth 2 (two) times daily.  Dispense: 30 tablet; Refill: 0  Add HCTZ/Kdur.  Cont low sodium diet. Reschedule next OV to be in 2 weeks so can recheck swelling, BP, and BMET at that time in addition to other things needed to check at [facility].  Signed, [address] [place], [place], [facility] 02/26/2013 10:40 AM

## 2013-02-27 ENCOUNTER — Ambulatory Visit: Payer: Self-pay | Admitting: Family Medicine

## 2013-02-27 ENCOUNTER — Telehealth: Payer: Self-pay | Admitting: Family Medicine

## 2013-02-27 NOTE — Telephone Encounter (Signed)
Takes only 1/2 tab of 20 mg citalopram.  Med list corrected

## 2013-03-13 ENCOUNTER — Ambulatory Visit (INDEPENDENT_AMBULATORY_CARE_PROVIDER_SITE_OTHER): Payer: 59 | Admitting: Family Medicine

## 2013-03-13 ENCOUNTER — Encounter: Payer: Self-pay | Admitting: Family Medicine

## 2013-03-13 VITALS — BP 110/82 | HR 78 | Temp 98.6°F | Resp 18 | Wt 190.0 lb

## 2013-03-13 DIAGNOSIS — E039 Hypothyroidism, unspecified: Secondary | ICD-10-CM

## 2013-03-13 DIAGNOSIS — E7800 Pure hypercholesterolemia, unspecified: Secondary | ICD-10-CM | POA: Insufficient documentation

## 2013-03-13 DIAGNOSIS — I10 Essential (primary) hypertension: Secondary | ICD-10-CM

## 2013-03-13 DIAGNOSIS — K219 Gastro-esophageal reflux disease without esophagitis: Secondary | ICD-10-CM | POA: Insufficient documentation

## 2013-03-13 DIAGNOSIS — E785 Hyperlipidemia, unspecified: Secondary | ICD-10-CM

## 2013-03-13 LAB — TSH: TSH: 3.336 u[IU]/mL (ref 0.350–4.500)

## 2013-03-13 NOTE — Progress Notes (Signed)
Subjective:    Patient ID: April Beck, female    DOB: May 12, 1946, 67 y.o.   MRN: [id number]  HPI Patient is here to follow up her hypertension, hyperlipidemia, and hypothyroidism. Her most recent labs are listed below.  With regards to hypertension, she is currently taking hydrochlorothiazide 25 mg by mouth daily and diastolic 5 mg by mouth daily. She denies any chest pain shortness of breath or dyspnea on exertion. Regards to hyperlipidemia she is currently on Lipitor 40 mg by mouth daily. She denies any myalgias or right upper quadrant pain. She has mild hypothyroidism which she treats with levothyroxine 25 mcg by mouth daily. A TSH has not been checked recently. Her most recent CBC, CMP, and fasting lipid panel were all within normal limits. Lab on 02/26/2013  Component Date Value Range Status  . WBC 02/26/2013 4.8  4.0 - 10.5 K/uL Final  . RBC 02/26/2013 4.20  3.87 - 5.11 MIL/uL Final  . Hemoglobin 02/26/2013 12.6  12.0 - 15.0 g/dL Final  . HCT 40/98/1191 37.3  36.0 - 46.0 % Final  . MCV 02/26/2013 88.8  78.0 - 100.0 fL Final  . MCH 02/26/2013 30.0  26.0 - 34.0 pg Final  . MCHC 02/26/2013 33.8  30.0 - 36.0 g/dL Final  . RDW 47/82/9562 13.6  11.5 - 15.5 % Final  . Platelets 02/26/2013 183  150 - 400 K/uL Final  . Neutrophils Relative % 02/26/2013 35* 43 - 77 % Final  . Neutro Abs 02/26/2013 1.7  1.7 - 7.7 K/uL Final  . Lymphocytes Relative 02/26/2013 53* 12 - 46 % Final  . Lymphs Abs 02/26/2013 2.5  0.7 - 4.0 K/uL Final  . Monocytes Relative 02/26/2013 9  3 - 12 % Final  . Monocytes Absolute 02/26/2013 0.4  0.1 - 1.0 K/uL Final  . Eosinophils Relative 02/26/2013 3  0 - 5 % Final  . Eosinophils Absolute 02/26/2013 0.1  0.0 - 0.7 K/uL Final  . Basophils Relative 02/26/2013 0  0 - 1 % Final  . Basophils Absolute 02/26/2013 0.0  0.0 - 0.1 K/uL Final  . Smear Review 02/26/2013 Criteria for review not met   Final  . Cholesterol 02/26/2013 137  0 - 200 mg/dL Final   Comment: ATP III  Classification:                                < 200        mg/dL        Desirable                               200 - 239     mg/dL        Borderline High                               >= 240        mg/dL        High                             . Triglycerides 02/26/2013 196* <150 mg/dL Final  . HDL 13/04/6577 36* >39 mg/dL Final  . Total CHOL/HDL Ratio 02/26/2013 3.8   Final  . VLDL 02/26/2013 39  0 - 40 mg/dL Final  .  LDL Cholesterol 02/26/2013 62  0 - 99 mg/dL Final   Comment:                            Total Cholesterol/HDL Ratio:CHD Risk                                                 Coronary Heart Disease Risk Table                                                                 Men       Women                                   1/2 Average Risk              3.4        3.3                                       Average Risk              5.0        4.4                                    2X Average Risk              9.6        7.1                                    3X Average Risk             23.4       11.0                          Use the calculated Patient Ratio above and the CHD Risk table                           to determine the patient's CHD Risk.                          ATP III Classification (LDL):                                < 100        mg/dL         Optimal                               100 - 129     mg/dL  Near or Above Optimal                               130 - 159     mg/dL         Borderline High                               160 - 189     mg/dL         High                                > 190        mg/dL         Very High                             . Sodium 02/26/2013 141  135 - 145 mEq/L Final  . Potassium 02/26/2013 4.4  3.5 - 5.3 mEq/L Final  . Chloride 02/26/2013 104  96 - 112 mEq/L Final  . CO2 02/26/2013 31  19 - 32 mEq/L Final  . Glucose, Bld 02/26/2013 97  70 - 99 mg/dL Final  . BUN 16/06/9603 17  6 - 23 mg/dL Final  . Creat 54/05/8118  1.02  0.50 - 1.10 mg/dL Final  . Total Bilirubin 02/26/2013 0.8  0.3 - 1.2 mg/dL Final  . Alkaline Phosphatase 02/26/2013 64  39 - 117 U/L Final  . AST 02/26/2013 21  0 - 37 U/L Final  . ALT 02/26/2013 19  0 - 35 U/L Final  . Total Protein 02/26/2013 6.4  6.0 - 8.3 g/dL Final  . Albumin 14/78/2956 4.1  3.5 - 5.2 g/dL Final  . Calcium 21/30/8657 9.5  8.4 - 10.5 mg/dL Final   Past Medical History  Diagnosis Date  . 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 Renal insufficiency 06/19/2012  . Anxiety     hx of panic attack  . Arthritis     hands & knees  . Hypercholesteremia   . Hypertension   . GERD (gastroesophageal reflux disease)   . Hypothyroidism    Current Outpatient Prescriptions on File Prior to Visit  Medication Sig Dispense Refill  . acetaminophen (TYLENOL) 325 MG tablet Take 2 tablets (650 mg total) by mouth every 6 (six) hours as needed for pain.      [name] atorvastatin (LIPITOR) 40 MG tablet TAKE 1 TABLET BY MOUTH EVERY DAY  30 tablet  5  . citalopram (CELEXA) 20 MG tablet Take 10 mg by mouth daily. 1/2 tablet of 20 mg tab      . hydrochlorothiazide (HYDRODIURIL) 25 MG tablet Take 1 tablet (25 mg total) by mouth daily.  30 tablet  0  . ibuprofen (ADVIL) 200 MG tablet You can take 2-3 every 6 hours as needed for pain.  30 tablet  0  . levothyroxine (SYNTHROID, LEVOTHROID) 25 MCG tablet       . nebivolol (BYSTOLIC) 5 MG tablet Take 5 mg by mouth daily.      [name] omeprazole (PRILOSEC) 20 MG capsule       . polyethylene glycol (MIRALAX / GLYCOLAX) packet Take 17 g by mouth daily as needed.  14 each    . potassium chloride (K-DUR) 10 MEQ tablet Take 1 tablet (10 mEq total) by  mouth 2 (two) times daily.  30 tablet  0   No current facility-administered medications on file prior to visit.   No Known Allergies History   Social History  . Marital Status: Divorced    Spouse Name: N/A    Number of Children: N/A  . Years of Education: N/A   Occupational History  . Not on file.    Social History Main Topics  . Smoking status: Never Smoker   . Smokeless tobacco: Never Used  . Alcohol Use: No  . Drug Use: No  . Sexually Active:    Other Topics Concern  . Not on file   Social History Narrative  . No narrative on file   .afmh   Review of Systems  All other systems reviewed and are negative.       Objective:   Physical Exam  Vitals reviewed. Constitutional: She appears well-developed and well-nourished.  Neck: Neck supple. No JVD present. No thyromegaly present.  Cardiovascular: Normal rate, regular rhythm and normal heart sounds.  Exam reveals no gallop.   No murmur heard. Pulmonary/Chest: Effort normal and breath sounds normal. No respiratory distress. She has no wheezes. She has no rales.  Abdominal: Soft. Bowel sounds are normal. She exhibits no distension and no mass. There is no tenderness. There is no rebound and no guarding.  Lymphadenopathy:    She has no cervical adenopathy.  Skin: Skin is warm. No rash noted. No erythema. No pallor.  Psychiatric: She has a normal mood and affect. Her behavior is normal. Judgment and thought content normal.          Assessment & Plan:  1. HTN (hypertension) She continues to complain of some leg swelling although it is better on the hydrochlorothiazide. I tried to dissuade her from switching to Lasix.  Her blood pressure is well controlled. I recommended we leave her medicines at the present dosages. I did recommend that she buy over-the-counter compression stockings and begin wearing them every day. I tried to explain the pelvis was most likely chronic venous insufficiency causing the swelling.    2. HLD (hyperlipidemia) Cholesterol is outstanding. Continue the Lipitor 40 mg by mouth daily  3. Unspecified hypothyroidism Patient is asymptomatic. I will check a TSH. I will titrate the levothyroxine to achieve a goal TSH.

## 2013-03-15 ENCOUNTER — Other Ambulatory Visit: Payer: Self-pay | Admitting: Family Medicine

## 2013-03-25 ENCOUNTER — Other Ambulatory Visit: Payer: Self-pay | Admitting: Physician Assistant

## 2013-03-25 DIAGNOSIS — I10 Essential (primary) hypertension: Secondary | ICD-10-CM

## 2013-03-25 DIAGNOSIS — R600 Localized edema: Secondary | ICD-10-CM

## 2013-03-25 MED ORDER — POTASSIUM CHLORIDE ER 10 MEQ PO TBCR: 10.0000 meq | EXTENDED_RELEASE_TABLET | Freq: Two times a day (BID) | ORAL | Status: DC

## 2013-03-25 NOTE — Telephone Encounter (Signed)
Rx Refilled  

## 2013-03-31 ENCOUNTER — Other Ambulatory Visit: Payer: Self-pay | Admitting: Family Medicine

## 2013-04-08 ENCOUNTER — Encounter: Payer: Self-pay | Admitting: Physician Assistant

## 2013-04-08 ENCOUNTER — Ambulatory Visit (INDEPENDENT_AMBULATORY_CARE_PROVIDER_SITE_OTHER): Payer: 59 | Admitting: Physician Assistant

## 2013-04-08 ENCOUNTER — Other Ambulatory Visit: Payer: Self-pay | Admitting: Physician Assistant

## 2013-04-08 VITALS — BP 124/84 | HR 60 | Temp 97.7°F | Resp 18

## 2013-04-08 DIAGNOSIS — A499 Bacterial infection, unspecified: Secondary | ICD-10-CM

## 2013-04-08 DIAGNOSIS — N760 Acute vaginitis: Secondary | ICD-10-CM

## 2013-04-08 DIAGNOSIS — B9689 Other specified bacterial agents as the cause of diseases classified elsewhere: Secondary | ICD-10-CM

## 2013-04-08 LAB — WET PREP FOR TRICH, YEAST, CLUE
Trich, Wet Prep: NONE SEEN
WBC, Wet Prep HPF POC: NONE SEEN
Yeast Wet Prep HPF POC: NONE SEEN

## 2013-04-08 MED ORDER — METRONIDAZOLE 500 MG PO TABS: 500.0000 mg | ORAL_TABLET | Freq: Two times a day (BID) | ORAL | Status: DC

## 2013-04-08 NOTE — Progress Notes (Signed)
   Patient ID: April Beck MRN: [id number], DOB: [birth year], 67 y.o. Date of Encounter: 04/08/2013, 9:36 AM    Chief Complaint:  Chief Complaint  Patient presents with  . c/o vaginal infection   slight discharge with oder     HPI: 67 y.o. year old white female has c/o vaginal irritation and discharge with odor.Has used no otc treatments/no treatments.     Home Meds: See attached medication section for any medications that were entered at today's visit. The computer does not put those onto this list.The following list is a list of meds entered prior to today's visit.   Current Outpatient Prescriptions on File Prior to Visit  Medication Sig Dispense Refill  . acetaminophen (TYLENOL) 325 MG tablet Take 2 tablets (650 mg total) by mouth every 6 (six) hours as needed for pain.      [name] atorvastatin (LIPITOR) 40 MG tablet TAKE 1 TABLET BY MOUTH EVERY DAY  30 tablet  5  . citalopram (CELEXA) 20 MG tablet Take 10 mg by mouth daily. 1/2 tablet of 20 mg tab      . hydrochlorothiazide (HYDRODIURIL) 25 MG tablet TAKE 1 TABLET EVERY DAY  30 tablet  3  . ibuprofen (ADVIL) 200 MG tablet You can take 2-3 every 6 hours as needed for pain.  30 tablet  0  . levothyroxine (SYNTHROID, LEVOTHROID) 25 MCG tablet TAKE 1 TABLET EVERY DAY  30 tablet  4  . nebivolol (BYSTOLIC) 5 MG tablet Take 5 mg by mouth daily.      [name] omeprazole (PRILOSEC) 20 MG capsule TAKE 2 CAPSULES BY MOUTH EVERY DAY  60 capsule  11  . polyethylene glycol (MIRALAX / GLYCOLAX) packet Take 17 g by mouth daily as needed.  14 each     No current facility-administered medications on file prior to visit.    Allergies: No Known Allergies    Review of Systems: See HPI for pertinent ROS. All other ROS negative.    Physical Exam: Blood pressure 124/84, pulse 60, temperature 97.7 F (36.5 C), temperature source Oral, resp. rate 18, weight 0 lb (0 kg)., Body mass index is 0.00 kg/(m^2). General:  Appears in no acute distress. Lungs:  Clear bilaterally to auscultation without wheezes, rales, or rhonchi. Breathing is unlabored. Heart: Regular rhythm. No murmurs, rubs, or gallops. Abdomen: Soft, non-tender, non-distended with normoactive bowel sounds. No hepatomegaly. No rebound/guarding. No obvious abdominal masses. PELVIC EXAM: Ext Genitalia Nml. Vaginal mucose with minimal erythema. Large amount of white clumpy discharge present. Cervix nml. Bimanyual exam nml. No adnexal mass or tnederness. No cervical motion tenderness. Msk:  Strength and tone normal for age. Extremities/Skin: Warm and dry. No clubbing or cyanosis. No edema. No rashes or suspicious lesions. Neuro: Alert and oriented X 3. Moves all extremities spontaneously. Gait is normal. CNII-XII grossly in tact. Psych:  Responds to questions appropriately with a normal affect.     ASSESSMENT AND PLAN:  67 y.o. year old female with  1. Vaginitis Wet Prep with Clues. No yeast.  Rx Flagyl 500 bid x 7 days. - WET PREP FOR TRICH, YEAST, CLUE - GC/chlamydia probe amp, genital   Signed, [address] [place], [place], [facility] 04/08/2013 9:36 AM

## 2013-04-09 LAB — GC/CHLAMYDIA PROBE AMP
CT Probe RNA: NEGATIVE
GC Probe RNA: NEGATIVE

## 2013-06-01 ENCOUNTER — Ambulatory Visit (INDEPENDENT_AMBULATORY_CARE_PROVIDER_SITE_OTHER): Payer: 59 | Admitting: Family Medicine

## 2013-06-01 DIAGNOSIS — Z23 Encounter for immunization: Secondary | ICD-10-CM

## 2013-06-12 ENCOUNTER — Ambulatory Visit (INDEPENDENT_AMBULATORY_CARE_PROVIDER_SITE_OTHER): Payer: 59 | Admitting: Family Medicine

## 2013-06-12 ENCOUNTER — Encounter: Payer: Self-pay | Admitting: Family Medicine

## 2013-06-12 VITALS — BP 124/74 | HR 60 | Temp 98.2°F | Resp 18 | Wt 195.0 lb

## 2013-06-12 DIAGNOSIS — B9689 Other specified bacterial agents as the cause of diseases classified elsewhere: Secondary | ICD-10-CM

## 2013-06-12 DIAGNOSIS — M79609 Pain in unspecified limb: Secondary | ICD-10-CM

## 2013-06-12 DIAGNOSIS — M79673 Pain in unspecified foot: Secondary | ICD-10-CM

## 2013-06-12 DIAGNOSIS — A499 Bacterial infection, unspecified: Secondary | ICD-10-CM

## 2013-06-12 DIAGNOSIS — R1310 Dysphagia, unspecified: Secondary | ICD-10-CM

## 2013-06-12 DIAGNOSIS — N952 Postmenopausal atrophic vaginitis: Secondary | ICD-10-CM

## 2013-06-12 DIAGNOSIS — N760 Acute vaginitis: Secondary | ICD-10-CM

## 2013-06-12 LAB — WET PREP FOR TRICH, YEAST, CLUE
Trich, Wet Prep: NONE SEEN
Yeast Wet Prep HPF POC: NONE SEEN

## 2013-06-12 MED ORDER — METRONIDAZOLE 500 MG PO TABS: 500.0000 mg | ORAL_TABLET | Freq: Two times a day (BID) | ORAL | Status: DC

## 2013-06-12 NOTE — Patient Instructions (Addendum)
Call if you want referral to GI for the food getting caught Use heel inserts  Take your ibuprofen as needed Call if the symptoms return

## 2013-06-14 DIAGNOSIS — M79673 Pain in unspecified foot: Secondary | ICD-10-CM | POA: Insufficient documentation

## 2013-06-14 DIAGNOSIS — R1310 Dysphagia, unspecified: Secondary | ICD-10-CM | POA: Insufficient documentation

## 2013-06-14 DIAGNOSIS — N952 Postmenopausal atrophic vaginitis: Secondary | ICD-10-CM | POA: Insufficient documentation

## 2013-06-14 DIAGNOSIS — B9689 Other specified bacterial agents as the cause of diseases classified elsewhere: Secondary | ICD-10-CM | POA: Insufficient documentation

## 2013-06-14 NOTE — Assessment & Plan Note (Signed)
Pain mostly in AM, likely has some OA associated, NSAIDS as needed Heel cups

## 2013-06-14 NOTE — Assessment & Plan Note (Signed)
Noted on exam, however signs of acute infection She may need topical estrogen in the near future

## 2013-06-14 NOTE — Assessment & Plan Note (Signed)
Flagyl course, if returns would try clindamycin

## 2013-06-14 NOTE — Assessment & Plan Note (Signed)
Declines work-up

## 2013-06-14 NOTE — Progress Notes (Signed)
  Subjective:    Patient ID: April Beck, female    DOB: July 07, 1946, 66 y.o.   MRN: [id number]  HPI  Pt here with vaginal discharge for the past week, treated for BV a few months ago. Denies vaginal bleeding, s/p hysterectomy. Denies douching, did change her soap recently.  Feels like she gets choked on her foods and sometimes water, it makes her cough after she eats and she has to clear her throat, has been present > 6 months, wants me to check her throat.  Heel pain for past couples of weeks, worse when she wakes up in AM feels very stiff, once she gets moving pain goes away, no pain during her daily activities.   Review of Systems  GEN- denies fatigue, fever, weight loss,weakness, recent illness HEENT- denies eye drainage, change in vision, nasal discharge, ABD- denies N/V, change in stools, abd pain GU- denies dysuria, hematuria, dribbling, incontinence MSK- + joint pain, muscle aches, injury       Objective:   Physical Exam  GEN- NAD, alert and oriented x 3 HEENT-PERRL, EOMI, non icteric, Nares clear, TM clear bilat, oropharynx clear Neck- supple, no LAD GU- Atrophy of  Labia , vaginal mucosa pink and moist, no cervix, no uterus present, , + fish thin clear discharge, ovaries not palpable, urethra normal position Ext- no edema, bilat heel no deformity, NT, no spur felt, NT at insertion of plantar fascia, good ROM ankles       Assessment & Plan:

## 2013-06-30 ENCOUNTER — Other Ambulatory Visit: Payer: Self-pay | Admitting: Family Medicine

## 2013-07-09 ENCOUNTER — Other Ambulatory Visit: Payer: Self-pay | Admitting: Family Medicine

## 2013-07-13 ENCOUNTER — Other Ambulatory Visit: Payer: Self-pay | Admitting: Family Medicine

## 2013-07-23 ENCOUNTER — Other Ambulatory Visit: Payer: Self-pay | Admitting: Family Medicine

## 2013-07-29 ENCOUNTER — Telehealth: Payer: Self-pay | Admitting: Physician Assistant

## 2013-07-29 MED ORDER — NEBIVOLOL HCL 5 MG PO TABS: 5.0000 mg | ORAL_TABLET | Freq: Every day | ORAL | Status: DC

## 2013-07-29 NOTE — Telephone Encounter (Signed)
Needs refill on Bystolic 5mg s

## 2013-08-28 ENCOUNTER — Other Ambulatory Visit: Payer: Self-pay | Admitting: Family Medicine

## 2013-09-15 ENCOUNTER — Telehealth: Payer: Self-pay | Admitting: Family Medicine

## 2013-09-15 ENCOUNTER — Other Ambulatory Visit: Payer: Self-pay | Admitting: Family Medicine

## 2013-09-15 NOTE — Telephone Encounter (Signed)
Pharmacy is [facility]  Pt is needing a refill on levothyroxine 36mcg Call back number is [phone] Pt is in [place] and she want be back till the 5th and she only has about 3 pills left

## 2013-09-16 ENCOUNTER — Other Ambulatory Visit: Payer: Self-pay | Admitting: Family Medicine

## 2013-09-16 MED ORDER — LEVOTHYROXINE SODIUM 25 MCG PO TABS: ORAL_TABLET | ORAL | Status: DC

## 2013-09-16 NOTE — Telephone Encounter (Signed)
[facility]'s [facility] [place] and appt made for CPE

## 2013-10-04 DIAGNOSIS — Z9289 Personal history of other medical treatment: Secondary | ICD-10-CM

## 2013-10-04 HISTORY — DX: Personal history of other medical treatment: Z92.89

## 2013-10-07 ENCOUNTER — Encounter: Payer: Self-pay | Admitting: Obstetrics & Gynecology

## 2013-10-07 ENCOUNTER — Ambulatory Visit: Payer: Medicare Other | Admitting: Obstetrics & Gynecology

## 2013-10-07 VITALS — BP 118/70 | Ht 62.0 in | Wt 171.0 lb

## 2013-10-07 DIAGNOSIS — Z1239 Encounter for other screening for malignant neoplasm of breast: Secondary | ICD-10-CM

## 2013-10-07 DIAGNOSIS — Z13820 Encounter for screening for osteoporosis: Secondary | ICD-10-CM

## 2013-10-07 DIAGNOSIS — Z01419 Encounter for gynecological examination (general) (routine) without abnormal findings: Secondary | ICD-10-CM

## 2013-10-07 DIAGNOSIS — Z90710 Acquired absence of both cervix and uterus: Secondary | ICD-10-CM | POA: Insufficient documentation

## 2013-10-07 LAB — POCT OCCULT BLOOD STOOL: Stool Occult Blood: NEGATIVE

## 2013-10-07 LAB — POCT URINALYSIS DIPSTICK (5)

## 2013-10-07 NOTE — Progress Notes (Signed)
Subjective:       Helen [name] is a 68 y.o. female here for a routine exam.  Current complaints: none. S/P TAH BSO 1991. No gyn problems.   Personal health questionnaire reviewed: yes.     Gynecologic History  No LMP recorded. Patient has had a hysterectomy.  Contraception: status post hysterectomy  Breast self exam: discussed  Common GYN tests  Last Pap Date: 11/11/09  Last Pap Result: Normal  Last Mammo Date: 11/01/12  Last Mammo Result: Normal (by PCP)  Last Colonoscopy Date: 07/04/10  Last Colonoscopy Result: Normal  Last Dexa Date: 08/03/10  Last Dexa Result: Normal    The following portions of the patient's history were reviewed and updated as appropriate: allergies, current medications, past family history, past medical history, past social history, past surgical history and problem list.      Review of Systems  Pertinent items are noted in HPI.      Objective:      BP 118/70  Ht 1.575 m (5\' 2" )  Wt 77.565 kg (171 lb)  BMI 31.27 kg/m2  General appearance: alert, appears stated age and cooperative  Neck: no adenopathy, supple, symmetrical, trachea midline and thyroid not enlarged, symmetric, no tenderness/mass/nodules  Breasts: normal appearance, no masses or tenderness, No nipple retraction or dimpling, No nipple discharge or bleeding, No axillary or supraclavicular adenopathy, Normal to palpation without dominant masses  Abdomen: soft, non-tender; bowel sounds normal; no masses,  no organomegaly  Pelvic exam:     Urinary system: urethral meatus normal   External genitalia: normal general appearance   Vaginal: normal rugae   Cervix: removed surgically   Adnexa: removed surgically   Uterus: removed surgically   Rectal: good sphincter tone, no masses and guaiac negative          Assessment:      Healthy female exam.        Plan:      Mammogram ordered.  Follow up in: 1 year.  Thin prep pap/HPV Yes   No further pap smears needed given ACOG guidelines.

## 2013-10-09 LAB — THINPREP IMAGING PAP WITH REFLEX TO HPV MRNA E6/E7.

## 2013-10-10 ENCOUNTER — Emergency Department: Payer: Medicare Other

## 2013-10-10 ENCOUNTER — Inpatient Hospital Stay
Admission: EM | Admit: 2013-10-10 | Discharge: 2013-10-12 | DRG: 312 | Disposition: A | Discharge status: Home / Self Care | Payer: Medicare Other | Attending: Internal Medicine | Admitting: Internal Medicine

## 2013-10-10 ENCOUNTER — Inpatient Hospital Stay: Payer: Medicare Other | Admitting: Internal Medicine

## 2013-10-10 ENCOUNTER — Other Ambulatory Visit: Payer: Medicare Other

## 2013-10-10 DIAGNOSIS — Z888 Allergy status to other drugs, medicaments and biological substances status: Secondary | ICD-10-CM

## 2013-10-10 DIAGNOSIS — B349 Viral infection, unspecified: Secondary | ICD-10-CM | POA: Diagnosis present

## 2013-10-10 DIAGNOSIS — E785 Hyperlipidemia, unspecified: Secondary | ICD-10-CM | POA: Diagnosis present

## 2013-10-10 DIAGNOSIS — I498 Other specified cardiac arrhythmias: Secondary | ICD-10-CM

## 2013-10-10 DIAGNOSIS — IMO0002 Reserved for concepts with insufficient information to code with codable children: Secondary | ICD-10-CM | POA: Diagnosis present

## 2013-10-10 DIAGNOSIS — J45909 Unspecified asthma, uncomplicated: Secondary | ICD-10-CM | POA: Diagnosis present

## 2013-10-10 DIAGNOSIS — Z9181 History of falling: Secondary | ICD-10-CM

## 2013-10-10 DIAGNOSIS — Z90710 Acquired absence of both cervix and uterus: Secondary | ICD-10-CM

## 2013-10-10 DIAGNOSIS — I440 Atrioventricular block, first degree: Secondary | ICD-10-CM

## 2013-10-10 DIAGNOSIS — R55 Syncope and collapse: Principal | ICD-10-CM

## 2013-10-10 DIAGNOSIS — IMO0001 Reserved for inherently not codable concepts without codable children: Secondary | ICD-10-CM

## 2013-10-10 DIAGNOSIS — Z9079 Acquired absence of other genital organ(s): Secondary | ICD-10-CM

## 2013-10-10 DIAGNOSIS — R011 Cardiac murmur, unspecified: Secondary | ICD-10-CM | POA: Diagnosis present

## 2013-10-10 DIAGNOSIS — I471 Supraventricular tachycardia: Secondary | ICD-10-CM

## 2013-10-10 DIAGNOSIS — I10 Essential (primary) hypertension: Secondary | ICD-10-CM | POA: Diagnosis present

## 2013-10-10 DIAGNOSIS — B9789 Other viral agents as the cause of diseases classified elsewhere: Secondary | ICD-10-CM

## 2013-10-10 DIAGNOSIS — Z853 Personal history of malignant neoplasm of breast: Secondary | ICD-10-CM

## 2013-10-10 LAB — COMPREHENSIVE METABOLIC PANEL
ALT: 20 U/L (ref 0–55)
AST (SGOT): 22 U/L (ref 5–34)
Albumin/Globulin Ratio: 1.1 (ref 0.9–2.2)
Albumin: 3.6 g/dL (ref 3.5–5.0)
Alkaline Phosphatase: 76 U/L (ref 40–150)
Anion Gap: 11 (ref 5.0–15.0)
BUN: 12 mg/dL (ref 7.0–19.0)
Bilirubin, Total: 0.8 mg/dL (ref 0.2–1.2)
CO2: 27 mEq/L (ref 22–29)
Calcium: 9.1 mg/dL (ref 8.5–10.5)
Chloride: 101 mEq/L (ref 98–107)
Creatinine: 0.9 mg/dL (ref 0.6–1.0)
Globulin: 3.3 g/dL (ref 2.0–3.6)
Glucose: 164 mg/dL — ABNORMAL HIGH (ref 70–100)
Potassium: 3.4 mEq/L — ABNORMAL LOW (ref 3.5–5.1)
Protein, Total: 6.9 g/dL (ref 6.0–8.3)
Sodium: 139 mEq/L (ref 136–145)

## 2013-10-10 LAB — CBC AND DIFFERENTIAL
Basophils Absolute Automated: 0.02 10*3/uL (ref 0.00–0.20)
Basophils Automated: 0 %
Eosinophils Absolute Automated: 0.09 10*3/uL (ref 0.00–0.70)
Eosinophils Automated: 1 %
Hematocrit: 34.5 % — ABNORMAL LOW (ref 37.0–47.0)
Hgb: 10.8 g/dL — ABNORMAL LOW (ref 12.0–16.0)
Immature Granulocytes Absolute: 0.01 10*3/uL
Immature Granulocytes: 0 %
Lymphocytes Absolute Automated: 1.47 10*3/uL (ref 0.50–4.40)
Lymphocytes Automated: 19 %
MCH: 22.8 pg — ABNORMAL LOW (ref 28.0–32.0)
MCHC: 31.3 g/dL — ABNORMAL LOW (ref 32.0–36.0)
MCV: 72.8 fL — ABNORMAL LOW (ref 80.0–100.0)
MPV: 9.6 fL (ref 9.4–12.3)
Monocytes Absolute Automated: 0.88 10*3/uL (ref 0.00–1.20)
Monocytes: 12 %
Neutrophils Absolute: 5.17 10*3/uL (ref 1.80–8.10)
Neutrophils: 68 %
Nucleated RBC: 0 (ref 0–1)
Platelets: 183 10*3/uL (ref 140–400)
RBC: 4.74 10*6/uL (ref 4.20–5.40)
RDW: 14 % (ref 12–15)
WBC: 7.63 10*3/uL (ref 3.50–10.80)

## 2013-10-10 LAB — TROPONIN I
Troponin I: <0.01 ng/mL (ref 0.00–0.09)
Troponin I: <0.01 ng/mL (ref 0.00–0.09)

## 2013-10-10 LAB — URINALYSIS, REFLEX TO MICROSCOPIC EXAM IF INDICATED
Bilirubin, UA: NEGATIVE
Blood, UA: NEGATIVE
Glucose, UA: NEGATIVE
Ketones UA: NEGATIVE
Nitrite, UA: NEGATIVE
Protein, UR: NEGATIVE
Specific Gravity UA: 1.006 (ref 1.001–1.035)
Urine pH: 6 (ref 5.0–8.0)
Urobilinogen, UA: NEGATIVE mg/dL

## 2013-10-10 LAB — HEMOLYSIS INDEX: Hemolysis Index: 4 (ref 0–18)

## 2013-10-10 LAB — IHS D-DIMER: D-Dimer: 0.9 ug/mL FEU — ABNORMAL HIGH (ref 0.00–0.51)

## 2013-10-10 LAB — GLUCOSE WHOLE BLOOD - POCT
Whole Blood Glucose POCT: 153 mg/dL — ABNORMAL HIGH (ref 70–100)
Whole Blood Glucose POCT: 175 mg/dL — ABNORMAL HIGH (ref 70–100)

## 2013-10-10 LAB — GFR: EGFR: >60

## 2013-10-10 MED ORDER — SODIUM CHLORIDE 0.9 % IV SOLN
100.0000 mL/h | INTRAVENOUS | Status: AC
Start: 2013-10-10 — End: 2013-10-11
  Administered 2013-10-10 – 2013-10-11 (×2): 100 mL/h via INTRAVENOUS

## 2013-10-10 MED ORDER — ALBUTEROL SULFATE (2.5 MG/3ML) 0.083% IN NEBU
2.5000 mg | INHALATION_SOLUTION | RESPIRATORY_TRACT | Status: DC
Start: 2013-10-10 — End: 2013-10-12
  Administered 2013-10-11 – 2013-10-12 (×6): 2.5 mg via RESPIRATORY_TRACT
  Filled 2013-10-10 (×5): qty 3
  Filled 2013-10-10: qty 6

## 2013-10-10 MED ORDER — ONDANSETRON HCL 4 MG/2ML IJ SOLN
4.0000 mg | Freq: Three times a day (TID) | INTRAMUSCULAR | Status: AC | PRN
Start: 2013-10-10 — End: 2013-10-11

## 2013-10-10 MED ORDER — SODIUM CHLORIDE 0.9 % IV SOLN
INTRAVENOUS | Status: DC
Start: 2013-10-10 — End: 2013-10-10

## 2013-10-10 MED ORDER — SODIUM CHLORIDE 0.9 % IV BOLUS
1000.0000 mL | Freq: Once | INTRAVENOUS | Status: AC
Start: 2013-10-10 — End: 2013-10-10
  Administered 2013-10-10: 1000 mL via INTRAVENOUS

## 2013-10-10 MED ORDER — ATORVASTATIN CALCIUM 40 MG PO TABS
40.0000 mg | ORAL_TABLET | Freq: Every day | ORAL | Status: DC
Start: 2013-10-10 — End: 2013-10-12
  Administered 2013-10-10 – 2013-10-11 (×2): 40 mg via ORAL
  Filled 2013-10-10 (×2): qty 1

## 2013-10-10 MED ORDER — INSULIN ASPART 100 UNIT/ML SC SOLN
1.0000 [IU] | Freq: Three times a day (TID) | SUBCUTANEOUS | Status: DC | PRN
Start: 2013-10-10 — End: 2013-10-12
  Administered 2013-10-11: 4 [IU] via SUBCUTANEOUS

## 2013-10-10 MED ORDER — HYDROCHLOROTHIAZIDE 25 MG PO TABS
25.0000 mg | ORAL_TABLET | Freq: Every day | ORAL | Status: DC
Start: 2013-10-10 — End: 2013-10-10

## 2013-10-10 MED ORDER — VALSARTAN 80 MG PO TABS
160.0000 mg | ORAL_TABLET | Freq: Every day | ORAL | Status: DC
Start: 2013-10-10 — End: 2013-10-10

## 2013-10-10 MED ORDER — EZETIMIBE 10 MG PO TABS
10.0000 mg | ORAL_TABLET | Freq: Every evening | ORAL | Status: DC
Start: 2013-10-10 — End: 2013-10-12
  Administered 2013-10-11: 10 mg via ORAL
  Filled 2013-10-10 (×3): qty 1

## 2013-10-10 MED ORDER — AMLODIPINE BESYLATE 5 MG PO TABS
10.0000 mg | ORAL_TABLET | Freq: Every day | ORAL | Status: DC
Start: 2013-10-10 — End: 2013-10-10

## 2013-10-10 MED ORDER — PANTOPRAZOLE SODIUM 40 MG PO TBEC
40.0000 mg | DELAYED_RELEASE_TABLET | Freq: Every day | ORAL | Status: DC
Start: 2013-10-10 — End: 2013-10-11
  Administered 2013-10-10: 40 mg via ORAL
  Filled 2013-10-10: qty 1

## 2013-10-10 MED ORDER — HYDRALAZINE HCL 20 MG/ML IJ SOLN
10.0000 mg | Freq: Four times a day (QID) | INTRAMUSCULAR | Status: DC | PRN
Start: 2013-10-10 — End: 2013-10-12

## 2013-10-10 MED ORDER — DEXTROSE 50 % IV SOLN
25.0000 mL | INTRAVENOUS | Status: DC | PRN
Start: 2013-10-10 — End: 2013-10-12

## 2013-10-10 MED ORDER — GLIPIZIDE 5 MG PO TABS
10.0000 mg | ORAL_TABLET | Freq: Every morning | ORAL | Status: DC
Start: 2013-10-11 — End: 2013-10-12
  Administered 2013-10-11 – 2013-10-12 (×2): 10 mg via ORAL
  Filled 2013-10-10 (×3): qty 2

## 2013-10-10 MED ORDER — SITAGLIPTIN PHOSPHATE 100 MG PO TABS
100.0000 mg | ORAL_TABLET | Freq: Every day | ORAL | Status: DC
Start: 2013-10-10 — End: 2013-10-12
  Administered 2013-10-10 – 2013-10-11 (×2): 100 mg via ORAL
  Filled 2013-10-10 (×3): qty 1

## 2013-10-10 MED ORDER — GLUCOSE 40 % PO GEL
15.0000 g | ORAL | Status: DC | PRN
Start: 2013-10-10 — End: 2013-10-12

## 2013-10-10 MED ORDER — GLUCAGON HCL (RDNA) 1 MG IJ SOLR
1.0000 mg | INTRAMUSCULAR | Status: DC | PRN
Start: 2013-10-10 — End: 2013-10-12

## 2013-10-10 NOTE — ED Provider Notes (Signed)
EMERGENCY DEPARTMENT HISTORY AND PHYSICAL EXAM     Physician/Midlevel provider first contact with patient: 10/10/13 1024         Date: 10/10/2013  Patient Name: Helen [name]    History of Presenting Illness     Chief Complaint   Patient presents with   . Flu like symptoms       History Provided By: Patient     Chief Complaint: Syncopal episode   Onset: Today   Quality: complete LOC  Modifying Factors: URI sxs x 3 days   Associated Symptoms: None     Additional History: Helen [name] is a 68 y.o. female BIBA s/p syncopal episode. The pt fell against a wall and was unresponsive for 30 seconds. Pt has had URI sxs for 3 days. Associated sxs include congestion, generalized weakness, chills, diffuse myalgia, rhinorrhea, and subjective fever. Pt denies N/V/D, CP, abdominal pain, dysuria, or taking any hormone pills. Hx of DM type II. Pt's grandson is sick with similar sxs.     PCP: Randolph Bing, MD      Current Facility-Administered Medications   Medication Dose Route Frequency Provider Last Rate Last Dose   . 0.9%  NaCl infusion   Intravenous Continuous Maryella Shivers, MD       . [address] sodium chloride 0.9 % bolus 1,000 mL  1,000 mL Intravenous Once Maryella Shivers, MD   1,000 mL at 10/10/13 1207     Current Outpatient Prescriptions   Medication Sig Dispense Refill   . albuterol (PROVENTIL) (2.5 MG/3ML) 0.083% nebulizer solution Take 2.5 mg by nebulization every 6 (six) hours as needed.       [name] amLODIPine (NORVASC) 10 MG tablet Take 10 mg by mouth daily.       [name] atorvastatin (LIPITOR) 40 MG tablet Take 40 mg by mouth daily.       . Blood Glucose Monitoring Suppl (ONE TOUCH ULTRA 2) W/DEVICE KIT One daily  1 each  0   . ezetimibe (ZETIA) 10 MG tablet Take 10 mg by mouth daily.       [name] glipiZIDE (GLUCOTROL) 5 MG tablet        . glucose blood (ONE TOUCH ULTRA TEST) test strip 2 times daily  200 each  3   . JANUVIA 100 MG tablet        . Lancets (ONETOUCH ULTRASOFT) lancets Use to check blood sugars 2 times daily   200 each  3   . nateglinide (STARLIX) 120 MG tablet Take 120 mg by mouth 3 (three) times daily before meals.       . Oxymetazoline HCl (NASAL SPRAY NA) by Nasal route.       . pantoprazole (PROTONIX) 40 MG tablet Take 40 mg by mouth daily.       . valsartan-hydrochlorothiazide (DIOVAN-HCT) 160-12.5 MG per tablet Take 1 tablet by mouth daily.           Past History     Past Medical History:  Past Medical History   Diagnosis Date   . Cancer      breast cancer   . Hyperlipidemia    . Hypertensive disorder    . Diabetes mellitus type II    . Malignant neoplasm of breast    . Breast lump    . Asthma without status asthmaticus    . Hemorrhoids without complication        Past Surgical History:  Past Surgical History   Procedure Date   .  Breast biopsy    . Hysterectomy      total       Family History:  History reviewed. No pertinent family history.    Social History:  History   Substance Use Topics   . Smoking status: Never Smoker    . Smokeless tobacco: Never Used   . Alcohol Use: No       Allergies:  Allergies   Allergen Reactions   . Aspirin      Breat out in sweat and dizziness        Review of Systems     Review of Systems   Constitutional: Positive for chills. Fever: Subjective.   HENT: Positive for congestion and rhinorrhea.    Eyes: Negative for photophobia and visual disturbance.   Respiratory: Negative for cough and shortness of breath.    Cardiovascular: Negative for chest pain and leg swelling.   Gastrointestinal: Negative for nausea, vomiting, abdominal pain and diarrhea.   Genitourinary: Negative for dysuria and flank pain.   Musculoskeletal: Positive for myalgias (Diffuse). Negative for back pain.   Skin: Negative for rash and wound.   Neurological: Positive for syncope and weakness (Generalized). Negative for headaches.   Psychiatric/Behavioral: Negative for dysphoric mood.         Physical Exam   BP 128/63  Pulse 77  Temp 98.2 F (36.8 C)  Resp 18  Ht 1.575 m  Wt 79.833 kg  BMI 32.18 kg/m2  SpO2  100%    Constitutional: Vital signs reviewed. Well appearing. No distress.  Head: Normocephalic, atraumatic  Eyes: Conjunctiva and sclera are normal.  No injection or discharge.  Ears, Nose, Throat:  Normal external examination of the nose and ears.  Mucous membranes moist.  Neck: Normal range of motion. Supple, no meningeal signs. Trachea midline.  Respiratory/Chest: Clear to auscultation. No respiratory distress.   Cardiovascular: Regular rate and rhythm. No murmurs.  Abdomen:  Bowel sounds intact. No rebound or guarding. Soft.  Non-tender.  Back: no cva tenderness to percussion.  Upper Extremity:  No edema. No cyanosis. Bilateral radial pulses intact and equal.   Lower Extremity:  No edema. No cyanosis. Bilateral calves symmetrical and non-tender.  Skin: Warm and dry. No rash.  Neuro: CNII -XII intact to testing. Strength 5/5 and symmetrical in the bilateral upper and lower extremities. Sensation to sharp touch intact and equal in the bilateral upper and lower extremities. Coordination intact to finger to nose testing . Normal gait.   Psychiatric:  Normal affect.  Normal insight.      Diagnostic Study Results     Labs -     Results     Procedure Component Value Units Date/Time    Troponin I [[id number]] Collected:10/10/13 1101    Specimen Information:Blood Updated:10/10/13 1131     Troponin I <0.01 ng/mL     Influenza A/B Virus Antigen [[id number]] Collected:10/10/13 1101    Specimen Information:Nasopharyngeal / Nasal Aspirate Updated:10/10/13 1129    Narrative:    ORDER#: [id number]                                    ORDERED BY: Lucianne Muss, Jennifer Holland  SOURCE: Nasal Aspirate                               COLLECTED:  10/10/13 11:01  ANTIBIOTICS AT COLL.:  RECEIVED :  10/10/13 11:07  Influenza Rapid Antigen A&B                FINAL       10/10/13 11:29  10/10/13   Negative for Influenza A and B             Reference Range: Negative      Comprehensive Metabolic Panel (CMP) [[id number]]  (Abnormal)  Collected:10/10/13 1101    Specimen Information:Blood Updated:10/10/13 1124     Glucose 164 (H) mg/dL      BUN 74.2 mg/dL      Creatinine 0.9 mg/dL      Sodium 595 mEq/L      Potassium 3.4 (L) mEq/L      Chloride 101 mEq/L      CO2 27 mEq/L      CALCIUM 9.1 mg/dL      Protein, Total 6.9 g/dL      Albumin 3.6 g/dL      AST (SGOT) 22 U/L      ALT 20 U/L      Alkaline Phosphatase 76 U/L      Bilirubin, Total 0.8 mg/dL      Globulin 3.3 g/dL      Albumin/Globulin Ratio 1.1      Anion Gap 11.0     Hemolysis index [[id number]] Collected:10/10/13 1101     Hemolysis Index 4 Updated:10/10/13 1124    GFR [[id number]] Collected:10/10/13 1101     EGFR >60.0 Updated:10/10/13 1124    CBC and differential [[id number]]  (Abnormal) Collected:10/10/13 1101    Specimen Information:Blood / Blood Updated:10/10/13 1114     WBC 7.63 x10 3/uL      RBC 4.74 x10 6/uL      Hgb 10.8 (L) g/dL      Hematocrit 09.3 (L) %      MCV 72.8 (L) fL      MCH 22.8 (L) pg      MCHC 31.3 (L) g/dL      RDW 14 %      Platelets 183 x10 3/uL      MPV 9.6 fL      Neutrophils 68 %      Lymphocytes Automated 19 %      Monocytes 12 %      Eosinophils Automated 1 %      Basophils Automated 0 %      Immature Granulocyte 0 %      Nucleated RBC 0      Neutrophils Absolute 5.17 x10 3/uL      Abs Lymph Automated 1.47 x10 3/uL      Abs Mono Automated 0.88 x10 3/uL      Abs Eos Automated 0.09 x10 3/uL      Absolute Baso Automated 0.02 x10 3/uL      Absolute Immature Granulocyte 0.01 x10 3/uL           Radiologic Studies -   Radiology Results (24 Hour)     Procedure Component Value Units Date/Time    CT Head without Contrast [[id number]] Collected:10/10/13 1157    Order Status:Completed  Updated:10/10/13 1202    Narrative:    CLINICAL INDICATION:  Syncope    TECHNIQUE:  Axial noncontrast CT images were obtained from the skull  base to the vertex.    COMPARISON:  None available    INTERPRETATION: Ventricles and sulcal pattern within normal limits for  age. No acute intracranial  hemorrhage, extra-axial collection, or  mass-effect. Gray-white differentiation maintained. Minimal mucosal  thickening of the paranasal sinuses. Small left maxillary sinus mucus  retention cyst versus polyp.           Impression:      No acute intracranial abnormality.    Mitali  Bapna, MD   10/10/2013 11:57 AM    Chest AP Portable [[id number]] Collected:10/10/13 1054    Order Status:Completed  Updated:10/10/13 1059    Narrative:    Clinical history:syncope    TECHNIQUE: AP view the chest was performed    FINDINGS: AP view the chest demonstrates that the heart size prominent  in the aorta is tortuous. The lungs are clear. 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 The chest is unchanged from examination of 05/30/2005. The  subdiaphragmatic region is clear.      Impression:      Skeletal arthritis is noted.  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  Lungs are clear.  The chest is unchanged from the examination of 05/30/2005.    Hal Hope, MD   10/10/2013 10:55 AM      .      Medical Decision Making   I am the first provider for this patient.    I reviewed the vital signs, available nursing notes, past medical history, past surgical history, family history and social history.    Vital Signs-Reviewed the patient's vital signs.     Patient Vitals for the past 12 hrs:   BP Temp Pulse Resp   10/10/13 1207 128/63 mmHg - 77  18    10/10/13 1150 116/58 mmHg - 71  20    10/10/13 1001 129/61 mmHg 98.2 F (36.8 C) 71  22        Pulse Oximetry Analysis - Normal 98% on RA    Cardiac Monitor:  Rate: 71    EKG:  Interpreted by the EP.   Time Interpreted: 10:50   Rate: 77   Rhythm: Sinus Rhythm w/ 1st deg AV block   Interpretation: No ST elevation. RBBB   Comparison: 04/04/07 - New RBBB    Old Medical Records: Old medical records.  Previous electrocardiograms.  Nursing notes.     ED Course:   11:44 AM - Discussed pt case with Dr. Higinio Plan, internal medicine, who agrees to admit the pt. Would like [place] to discuss the case with Dr. Moise Boring and [facility].    11:47 AM - Discussed pt case with Dr. Hyacinth Meeker, cardiology, who agrees with the plan.   11:49 AM - Discussed pt case with Dr. Moise Boring, neurology, who agrees with the plan.  12:30 PM - Updated pt and her family on the plan.     Provider Notes: Pt presenting to the ED with syncopal episode in the context of severe fatigue which is due to likely acute viral illness. History of DM, HTN, HL. No chest pain. ASA allergy. Admitted for further evaluation.     Core Measures:   Pt not given ASA due to allergy.       Diagnosis     Clinical Impression:   1. Syncope    2. Acute viral syndrome        _______________________________    Attestations:  This note is prepared by Belia Heman acting as Scribe for Maryella Shivers, MD.    Maryella Shivers, MD.  The scribe's documentation has been prepared under my direction and personally reviewed by me in its entirety.  I confirm that the note above accurately reflects all work, treatment, procedures, and medical decision making performed by me.  _______________________________           Maryella Shivers, MD  10/10/13 [phone]

## 2013-10-10 NOTE — ED Notes (Signed)
Pt eating boxed lunch without assistance.

## 2013-10-10 NOTE — Consults (Signed)
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    History:   Helen [name] is a 68 y.o. female admitted on 10/10/2013, for whom we are asked to provide cardiac consultation, regarding syncope.  Recent URI. Treated with mucinex last night. Was standing talking with son-in-law when had "buzzing" in ears and than passed out for 30 seconds.  No incontinence or seizure activity note. No post ictal confusion. No prior episodes. Has kept hydrated with recent URI and continued to take medications.  No history of hypogylcemia.  Exercises regularly, physically active with no symptoms of chest pain or shortness of breath. Long history of heart murmur.      Past Medical History:     Past Medical History   Diagnosis Date   . Cancer      breast cancer   . Hyperlipidemia    . Hypertensive disorder    . Diabetes mellitus type II    . Malignant neoplasm of breast    . Breast lump    . Asthma without status asthmaticus    . Hemorrhoids without complication        Past Surgical History:     Past Surgical History   Procedure Date   . Breast biopsy    . Hysterectomy      total       Family History:   History reviewed. No pertinent family history.    Social History:     History     Social History   . Marital Status: Legally Separated     Spouse Name: N/A     Number of Children: N/A   . Years of Education: N/A     Social History Main Topics   . Smoking status: Never Smoker    . Smokeless tobacco: Never Used   . Alcohol Use: No   . Drug Use: No   . Sexually Active: No     Other Topics Concern   . Not on file      Social History Narrative   . No narrative on file       Allergies:     Allergies   Allergen Reactions   . Aspirin      Breat out in sweat and dizziness          Home Medications:     (Not in a hospital admission)      Medications:      Scheduled Meds: PRN Meds:    Current Facility-Administered Medications   Medication Dose Route Frequency   . [COMPLETED] sodium chloride  1,000 mL Intravenous Once       Continuous Infusions:       . sodium chloride                    Review of Systems:   General ROS: negative for - chills, fatigue, fever, malaise, weight gain or weight loss  Ophthalmic ROS: negative for - double vision  ENT ROS:  negative for - epistaxis, hearing change, tinnitus or vertigo  Hematological and Lymphatic ROS: negative for - bleeding problems, blood clots or bruising  Endocrine ROS: negative for - palpitations or unexpected weight changes  Respiratory ROS: no cough, shortness of breath, or wheezing  Cardiovascular ROS: no chest pain or dyspnea on exertion  Gastrointestinal ROS: no abdominal pain, change in bowel habits, or black or bloody stools  Genito-Urinary ROS: negative for - hematuria or nocturia  Musculoskeletal ROS: negative for - joint pain, muscle pain or muscular weakness  Neurological ROS: no TIA or stroke symptoms  Dermatological ROS: negative for rash    Physical Exam:     Filed Vitals:    10/10/13 1403   BP: 130/71   Pulse: 71   Temp: 98 F (36.7 C)   Resp: 18   SpO2: 100%     Temp (24hrs), Avg:98.1 F (36.7 C), Min:98 F (36.7 C), Max:98.2 F (36.8 C)      Intake and Output Summary (Last 24 hours) at Date Time  No intake or output data in the 24 hours ending 10/10/13 1547    GENERAL: Patient is in no acute distress   HEENT: No scleral icterus or conjunctival pallor, moist mucous membranes   NECK: No jugular venous distention or thyromegaly, normal carotid upstrokes without bruits   CARDIAC: Normal apical impulse, regular rate and rhythm, with normal S1 and S2, 1-2/6 systolic  murmur lsb    CHEST: Clear to auscultation bilaterally, normal respiratory effort  ABDOMEN: No abdominal bruits, masses, or hepatosplenomegaly, nontender, non-distended, good bowel sounds   EXTREMITIES: No clubbing, cyanosis, or edema, 2+ DP, PT, and femoral pulses bilaterally without bruits  SKIN: No rash or jaundice   NEUROLOGIC: Alert and oriented to time, place and person, normal mood and affect  MUSCULOSKELETAL: Normal muscle strength and tone.      Labs Reviewed:     Lab 10/10/13 1101   CK --   TROPI <0.01   TROPT --   CKMBINDEX --     No results found for this basename: DIG in the last 168 hours  No results found for this basename: CHOL:3,TRIG:3,HDL:3,LDL:3 in the last 168 hours    Lab 10/10/13 1101   BILITOTAL 0.8   BILIDIRECT --   PROT 6.9   ALB 3.6   ALT 20   AST 22     No results found for this basename: MG in the last 168 hours  No results found for this basename: PT,INR,PTT in the last 168 hours    Lab 10/10/13 1101   WBC 7.63   HGB 10.8*   HCT 34.5*   PLT 183       Lab 10/10/13 1101   NA 139   K 3.4*   CL 101   CO2 27   BUN 12.0   CREAT 0.9   EGFR >60.0   GLU 164*   CA 9.1     No results found for this basename: TSH,FREET3,FREET4 in the last 168 hours    .  No results found for this basename: BNP        Radiology:   Radiological Procedure reviewed.   Ct Head Without Contrast    10/10/2013  CLINICAL INDICATION:  Syncope  TECHNIQUE:  Axial noncontrast CT images were obtained from the skull base to the vertex.  COMPARISON:  None available  INTERPRETATION: Ventricles and sulcal pattern within normal limits for age. No acute intracranial hemorrhage, extra-axial collection, or mass-effect. Gray-white differentiation maintained. Minimal  mucosal thickening of the paranasal sinuses. Small left maxillary sinus mucus retention cyst versus polyp.           10/10/2013    No acute intracranial abnormality.  Mitali  Bapna, MD  10/10/2013 11:57 AM     Chest Ap Portable    10/10/2013  Clinical history:syncope  TECHNIQUE: AP  view the chest was performed  FINDINGS: AP view the chest demonstrates that the heart size prominent in the aorta is tortuous. The lungs are clear. 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 The chest is unchanged from examination of 05/30/2005. The subdiaphragmatic region is clear.      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 Lungs are clear. The chest is unchanged from the examination of 05/30/2005.  Hal Hope, MD  10/10/2013 10:55 AM       EKG:    SR, RBBB, LAHB, PVCs, 1st degree AV block                Signed by: Ma Hillock, MD [facility]        [facility]  NP Spectralink [phone] (8am-5pm)  MD Spectralink [phone] (8am-5pm)  After hours, non urgent consult line [phone]  After Hours, urgent consults [phone]

## 2013-10-10 NOTE — Progress Notes (Signed)
Severe Sepsis Screen  Date: 10/10/2013 Time: 7:38 PM  Nurse Signature: Merceda Elks Sylvester Salonga    A. Infection:    Does your patient have ONE or more of the following infection criteria?     []  Documented Infection - Does the patient have positive culture results (from blood,        sputum, urine, etc)?   []  Anti-Infective Therapy - Is the patient receiving antibiotic, antifungal, or other                anti-infective therapy?   []  Pneumonia - Is there documentation of pneumonia (X Ray, etc)?   []  WBC's - Have WBC's been found in normally sterile fluid (urine, CSF, etc.)?   []  Perforated Viscus -Does the patient have a perforated hollow organ (bowel)?    A.  Did you check any of the boxes above?    [x]  No  If No, Stop Here and Sepsis Screen Negative.               []  Yes, continue to section B      B. SIRS:     Does your patient have TWO or more of the following SIRS criteria (ensure vital signs & temperature are within 1 hour of this screening)?    []  Temperature - Is the patient's temperature: Temp: 98.2 F (36.8 C) (10/10/13 1934)   - Greater than or equal to 38.3 degrees C (greater than 100.9 degrees F)?   - Less than or equal to 36 degrees C (less than or equal to 96.8 degrees F)?    []  Heart Rate: Heart Rate: 68  (10/10/13 1934)   - Is the patient's heart rate greater than or equal to 90 bpm?    []  Respiratory: Resp Rate: 16  (10/10/13 1934)   - Is the patient's respiratory rate greater than or equal to 20?    []  WBC Count - Is the patient's WBC count:   Lab 10/10/13 1101   WBC 7.63      - Greater than or equal to 12,000/mm3 OR   - Less than or equal to 4,000/mm3 OR    - Are there greater than 10% immature neutrophils (bands)?    []  Glucose >140 without diabetes?   Lab 10/10/13 1101   GLU 164*       []  Significant edema?    B.  Did you check two or more of the boxes above?     []  No, Stop Here and Sepsis Screen Negative   []  Yes, continue to section C    C.  Acute Organ Dysfunction     Does your patient have  ONE or more of the following organ dysfunction? (May need to wait for lab results for assessment - see below) Organ dysfunction must be a result of the sepsis not chronic conditions.    []  Cardiovascular - Does the patient have a: BP: 107/54 mmHg (10/10/13 1934)   -systolic Blood pressure less than or equal to 90 mmHg OR   -systolic blood pressure has dropped 40 mmHg or more from baseline OR   -mean arterial pressure less than or equal to 70 mmHg (for at least one hour   despite fluid resuscitation OR require vasopressor support?  []  Respiratory - Does the patient have new hypoxia defined by any of the following"   -A sustained increase in oxygen requirements by at least 2L/min on NC or 28%    FiO2 within the last 24 hrs OR   -  A persistent decrease in oxygen saturation of greater than or equal to 5% lasting   at least four or more hours and occurring within the last 24 hours  []  Renal - Does the patient have:   -low urine output (e.g. Less than 0.60mL/kg/HR for one hour despite adequate fluid    resuscitation, OR   -Increased creatinine (greater than 50% increase from baseline) OR   -require acute dialysis?  []  Hematologic - Does the patient have a:   -Low platelet count (less than 100,000 mm3)   Lab 10/10/13 1101   PLT 183   OR   -INR/aPTT greater than upper limit of normal?No results found for this basename: INR:1 in the last 168 hours or                No results found for this basename: APTT:1 in the last 168 hours  []  Metabolic - Does the patient have a high lactate (plasma lactate greater than or equal to 2.4 mMol/L)? No results found for this basename: LACTATE:5 in the last 168 hours    []  Hepatic - Are the patient's liver enzymes elevated (ALT greater than 72 IU/L or Total      Bilirubin greater than 2 MG/dL)?    Lab 10/10/13 1101   BILITOTAL 0.8   ALT 20     []  CNS - Does the patient have altered consciousness or reduced Glasgow Coma      Scale?    C.  Did you check any of the boxes above?     []  No, Sepsis  Screen Negative   []  YES:  A) Infection + B) SIRS + C) Organ Dysfunction = Positive Screen for Severe Sepsis     Notify MD and document in Complex Assessment under provider notification   - Name of physician notified:                                           - Date/Time Notifiied:                                             Document actions: Following must be completed within 1 hour of positive sepsis screen   []  Lactate drawn (if initial lactate > , repeat lactate in 2 hours for goal decrease 10-20%)   []  Blood Cultures obtained (prior to antibiotic administration; if not done within the last 48 hours)   []  Antibiotics initiated or modified    []   IV Fluid administered 0.9% NS __________ mLs given (Initial Bolus of 30 ml/kg if SBP < 90 or MAP < 65 or lactate greater than 4 mmol/dl)  Nursing Comments?:     _________________________________________________________________    Patients meeting the following criteria are excluded from screening (check if applicable):   []  [place] hypothermia protocol   []  Comfort Care orders   []  Surgery- No screening for 24 hours after surgery (48 hours after CV surgery)       -If Yes. Date of surgery:______________________   []  Admitted with sepsis and until 72 hours after admission with documented sepsis (RESUME SEPSIS SCREEN AFTER 72 hour window!!!)      -If Yes, Date of Documented Sepsis:______________________   []  Positive screen AND Completed sepsis bundle during previous 24 hours       -  If Yes, Date/Time of positive severe sepsis screen:_______________________

## 2013-10-10 NOTE — H&P (Addendum)
ADMISSION HISTORY AND PHYSICAL EXAM    Date Time: 10/10/2013 7:13 PM  Patient Name: Helen [name]  Attending Physician: Helen [name], Helen [name]  Primary Care Physician: Helen [name], Helen [name]      CC:   S/p Syncope    History of Presenting Illness:   Helen [name] is [name] 68 y.o. female BIBA s/p syncopal episode. The pt fell against [name] wall and was unresponsive for 30 seconds. Pt has had URI sxs for 3 days. Associated sxs include congestion, generalized weakness, chills, diffuse myalgia, rhinorrhea, and subjective fever. Pt denies N/V/D, CP, abdominal pain, dysuria, or taking any hormone pills. Hx of DM type II. Pt's grandson is sick with similar sxs. Patient evaluated to be lethargic, fatigue. No chest pain or palpitations being admitted for further evaluation s/p syncope.        Past Medical History:     Past Medical History   Diagnosis Date   . Cancer      breast cancer   . Hyperlipidemia    . Hypertensive disorder    . Diabetes mellitus type II    . Malignant neoplasm of breast    . Breast lump    . Asthma without status asthmaticus    . Hemorrhoids without complication        Available old records reviewed .    Past Surgical History:     Past Surgical History   Procedure Date   . Breast biopsy    . Hysterectomy      total       Family History:   History reviewed. No pertinent family history.    Social History:     History     Social History   . Marital Status: Legally Separated     Spouse Name: N/[name]     Number of Children: N/[name]   . Years of Education: N/[name]     Social History Main Topics   . Smoking status: Never Smoker    . Smokeless tobacco: Never Used   . Alcohol Use: No   . Drug Use: No   . Sexually Active: No     Other Topics Concern   . Not on file     Social History Narrative   . No narrative on file       Allergies:     Allergies   Allergen Reactions   . Aspirin      Breat out in sweat and dizziness        Medications:     Prescriptions prior to admission   Medication Sig   . albuterol (PROVENTIL) (2.5 MG/3ML)  0.083% nebulizer solution Take 2.5 mg by nebulization every 6 (six) hours as needed.   [name] amLODIPine (NORVASC) 10 MG tablet Take 10 mg by mouth daily.   [name] atorvastatin (LIPITOR) 40 MG tablet Take 40 mg by mouth daily.   . Blood Glucose Monitoring Suppl (ONE TOUCH ULTRA 2) W/DEVICE KIT One daily   . ezetimibe (ZETIA) 10 MG tablet Take 10 mg by mouth daily.   [name] glipiZIDE (GLUCOTROL) 5 MG tablet    . glucose blood (ONE TOUCH ULTRA TEST) test strip 2 times daily   . JANUVIA 100 MG tablet    . Lancets (ONETOUCH ULTRASOFT) lancets Use to check blood sugars 2 times daily   . nateglinide (STARLIX) 120 MG tablet Take 120 mg by mouth 3 (three) times daily before meals.   . Oxymetazoline HCl (NASAL SPRAY NA) by Nasal route.   [name]  pantoprazole (PROTONIX) 40 MG tablet Take 40 mg by mouth daily.   . valsartan-hydrochlorothiazide (DIOVAN-HCT) 160-12.5 MG per tablet Take 1 tablet by mouth daily.       Current Facility-Administered Medications   Medication Dose Route Frequency   . [COMPLETED] sodium chloride  1,000 mL Intravenous Once       Review of Systems:   General ROS: negative for - chills,  fever  Psychological ROS: negative for - anxiety , depression  Ophthalmic ROS: negative for - blurry vision or eye pain  ENT ROS: negative for - nasal congestion or oral lesions  Allergy and Immunology ROS: negative for - hives  Hematological and Lymphatic ROS: negative for - bleeding problems  Endocrine ROS: negative for - temperature intolerance  Respiratory ROS: no cough, shortness of breath, or wheezing  Cardiovascular ROS: no chest pain or dyspnea on exertion  Gastrointestinal ROS: no abdominal pain, change in bowel habits, or black or bloody stools  Genito-Urinary ROS: no dysuria, trouble voiding, or hematuria  Musculoskeletal ROS: negative for - joint pain or joint swelling  Neurological ROS: negative for - numbness/tingling or weakness  Dermatological ROS: negative for rash      Physical Exam:   Blood pressure 118/56, pulse 73,  temperature 97.7 F (36.5 C), temperature source Oral, resp. rate 20, height 1.575 m (5\' 2" ), weight 79.833 kg (176 lb), SpO2 96.00%.    Intake and Output Summary (Last 24 hours) at Date Time  No intake or output data in the 24 hours ending 10/10/13 1913    General:  no acute distress.  HEENT: perrla, eomi, mucous membranes moist  Neck: supple  Cardiovascular: regular rate and rhythm, no murmurs.  Lungs: clear to auscultation bilaterally, without wheezing  Abdomen: soft, non-tender, non-distended; no palpable masses, normoactive bowel sounds  Extremities: no clubbing, cyanosis, or edema  Neuro: alert, awake, oriented , motor strength grossly intact.   Skin: warm and dry.  Heme: no bleeding.  Psych: moods stable.      Labs:   Labs reviewed .  Results     Procedure Component Value Units Date/Time    D-Dimer [[id number]]  (Abnormal) Collected:10/10/13 1739     D-Dimer 0.90 (H) ug/mL FEU Updated:10/10/13 1852    Troponin I [[id number]] Collected:10/10/13 1739    Specimen Information:Blood Updated:10/10/13 1850     Troponin I <0.01 ng/mL     UA, Reflex to Microscopic [[id number]]  (Abnormal) Collected:10/10/13 1808    Specimen Information:Urine Updated:10/10/13 1837     Urine Type Clean Catch      Color, UA Straw      Clarity, UA Clear      Specific Gravity UA 1.006      Urine pH 6.0      Leukocyte Esterase, UA Trace ([name])      Nitrite, UA Negative      Protein, UR Negative      Glucose, UA Negative      Ketones UA Negative      Urobilinogen, UA Negative mg/dL      Bilirubin, UA Negative      Blood, UA Negative      RBC, UA 0 - 5 /hpf      WBC, UA 0 - 5 /hpf      Squamous Epithelial Cells, Urine 0 - 5 /hpf     Glucose Whole Blood - POCT [[id number]]  (Abnormal) Collected:10/10/13 1637     POCT - Glucose Whole blood 153 (H) mg/dL MWUXLKG:40/10/27 2536    Troponin I [  [id number]] Collected:10/10/13 1101    Specimen Information:Blood Updated:10/10/13 1627     Troponin I <0.01 ng/mL     Influenza [name]/B Virus Antigen [[id number]]  Collected:10/10/13 1101    Specimen Information:Nasopharyngeal / Nasal Aspirate Updated:10/10/13 1129    Narrative:    ORDER#: [id number]                                    ORDERED BY: Lucianne Muss, VINOD  SOURCE: Nasal Aspirate                               COLLECTED:  10/10/13 11:01  ANTIBIOTICS AT COLL.:                                RECEIVED :  10/10/13 11:07  Influenza Rapid Antigen [name]&B                FINAL       10/10/13 11:29  10/10/13   Negative for Influenza [name] and B             Reference Range: Negative      Comprehensive Metabolic Panel (CMP) [[id number]]  (Abnormal) Collected:10/10/13 1101    Specimen Information:Blood Updated:10/10/13 1124     Glucose 164 (H) mg/dL      BUN 62.9 mg/dL      Creatinine 0.9 mg/dL      Sodium 528 mEq/L      Potassium 3.4 (L) mEq/L      Chloride 101 mEq/L      CO2 27 mEq/L      CALCIUM 9.1 mg/dL      Protein, Total 6.9 g/dL      Albumin 3.6 g/dL      AST (SGOT) 22 U/L      ALT 20 U/L      Alkaline Phosphatase 76 U/L      Bilirubin, Total 0.8 mg/dL      Globulin 3.3 g/dL      Albumin/Globulin Ratio 1.1      Anion Gap 11.0     Hemolysis index [[id number]] Collected:10/10/13 1101     Hemolysis Index 4 Updated:10/10/13 1124    GFR [[id number]] Collected:10/10/13 1101     EGFR >60.0 Updated:10/10/13 1124    CBC and differential [[id number]]  (Abnormal) Collected:10/10/13 1101    Specimen Information:Blood / Blood Updated:10/10/13 1114     WBC 7.63 x10 3/uL      RBC 4.74 x10 6/uL      Hgb 10.8 (L) g/dL      Hematocrit 63.8 (L) %      MCV 72.8 (L) fL      MCH 22.8 (L) pg      MCHC 31.3 (L) g/dL      RDW 14 %      Platelets 183 x10 3/uL      MPV 9.6 fL      Neutrophils 68 %      Lymphocytes Automated 19 %      Monocytes 12 %      Eosinophils Automated 1 %      Basophils Automated 0 %      Immature Granulocyte 0 %      Nucleated RBC 0      Neutrophils Absolute 5.17 x10 3/uL      Abs Lymph Automated  1.47 x10 3/uL      Abs Mono Automated 0.88 x10 3/uL      Abs Eos Automated 0.09 x10 3/uL       Absolute Baso Automated 0.02 x10 3/uL      Absolute Immature Granulocyte 0.01 x10 3/uL         Imaging personally reviewed .  Radiology Results (24 Hour)     Procedure Component Value Units Date/Time    CT Head without Contrast [[id number]] Collected:10/10/13 1157    Order Status:Completed  Updated:10/10/13 1202    Narrative:    CLINICAL INDICATION:  Syncope    TECHNIQUE:  Axial noncontrast CT images were obtained from the skull  base to the vertex.    COMPARISON:  None available    INTERPRETATION: Ventricles and sulcal pattern within normal limits for  age. No acute intracranial hemorrhage, extra-axial collection, or  mass-effect. Gray-white differentiation maintained. Minimal mucosal  thickening of the paranasal sinuses. Small left maxillary sinus mucus  retention cyst versus polyp.           Impression:      No acute intracranial abnormality.    Helen  Bapna, Helen [name]   10/10/2013 11:57 AM    Chest AP Portable [[id number]] Collected:10/10/13 1054    Order Status:Completed  Updated:10/10/13 1059    Narrative:    Clinical history:syncope    TECHNIQUE: AP view the chest was performed    FINDINGS: AP view the chest demonstrates that the heart size prominent  in the aorta is tortuous. The lungs are clear. 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 The chest is unchanged from examination of 05/30/2005. The  subdiaphragmatic region is clear.      Impression:      Skeletal arthritis is noted.  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  Lungs are clear.  The chest is unchanged from the examination of 05/30/2005.    Helen Hope, Helen [name]   10/10/2013 10:55 AM          Assessment:     Patient Active Problem List   Diagnosis   . Diabetes type 2, uncontrolled   . Hypertension   . Hyperlipidemia   . H/O total hysterectomy with removal of both tubes and ovaries   . Syncope   . 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

## 2013-10-10 NOTE — Plan of Care (Signed)
Problem: Safety  Goal: Patient will be free from injury during hospitalization  Outcome: Progressing  STATUS: Maintain hourly rounding, encouraged to use call bell for assistance. Provide and maintain safe environment. Ensure call bell and telephone within reach.   PLAN: Continue monitor for safety, monitor for any concerns.        Problem: Pain  Goal: Patient's pain/discomfort is manageable  Outcome: Progressing  STATUS:Will continue to monitor pain and reassess after medication administration. Monitor and educate regarding   possible side effect. Continue to offer non pharmacological pain management.   PLAN: Continue pain management     Comments:   Report received from ER nurse . Admitted to unit via stretcher by transport.  Assess patient per protocol. Given cardiac diabetic diet,  IV access on left  AC, started IVF normal saline at 100 ml/hr. Place patient on telemetry.  Oriented patient in the room.

## 2013-10-10 NOTE — ED Notes (Signed)
ZOX:[id number]<AV> Expected date:10/10/13<BR> Expected time: 9:34 AM<BR> Means of arrival:[id number]- [place]<BR> Comments:<BR>

## 2013-10-10 NOTE — Consults (Signed)
Progress Note    Date Time: 10/10/2013 6:16 PM  Patient Name: Helen [name] A  Attending Physician: Mosie Epstein, MD      Assessment & Plan:   68 yo female with HX breast CA, HTN, DM asthma who has had URI symptoms (cough, rhinorrhea, subjective fevers/chills) and has generally been feeling weak from this.  Today she was standing when she heard a buzzing sensation in her ears and then abruptly lost consciousness.  She fell to the ground and apparently quickly recovered herself and had no loss of urinary control or apparent post-ictal state.  She denies focal weakness or numbness.  Also denies CP, palp, SOB.  She is feeling better at this time.    Syncope - unclear etiology.  Doubt seizure given the lack of incontinence, rapid recovery.    -  Check orthostatics  -  Telemetry monitoring  -  Cardiology on board  -  Will follow up on orthostatics    Review of Systems:   No headache, eye, ear nose, throat problems; no coughing or wheezing or shortness of breath, No chest pain or orthopnea, no abdominal pain, nausea or vomiting, No pain in the body or extremities, no psychiatric, neurological, endocrine, hematological or cardiac complaints except as noted above.     Physical Exam:   Blood pressure 136/63, pulse 78, temperature 97.7 F (36.5 C), temperature source Oral, resp. rate 20, height 1.575 m (5\' 2" ), weight 79.833 kg (176 lb), SpO2 96.00%.    HEENT: Normocephalic. No icter or congestion  Neck: supple, no lymphadenopathy, no thyromegaly, no JVD  Extremities: no clubbing, cyanosis, or edema  Skin: no rashes or lesions noted    Neuro:  Level of consciousness:  Alert and appropriate  Oriented:  X 3  Facial Movements: symmetric  Strength:  No upper extremity drift  Sensation to light touch: Intact bilaterally      Meds:      Scheduled Meds: PRN Meds:         [COMPLETED] sodium chloride 1,000 mL Intravenous Once       Continuous Infusions:       . sodium chloride 100 mL/hr (10/10/13 1645)   . [DISCONTINUED] sodium  chloride           ondansetron 4 mg Q8H PRN           I personally reviewed all of the medications    Labs:     Lab 10/10/13 1101   GLU 164*   BUN 12.0   CREAT 0.9   CA 9.1   NA 139   K 3.4*   CL 101   CO2 27   ALB 3.6   PHOS --   MG --   AST 22   ALT 20   BILITOTAL 0.8   ALKPHOS 76       Lab 10/10/13 1101   WBC 7.63   HGB 10.8*   HCT 34.5*   MCV 72.8*   MCH 22.8*   MCHC 31.3*   PLT 183         No results found for this basename: PTT:2,PT:2,INR:2 in the last 72 hours       Radiology Results (24 Hour)     Procedure Component Value Units Date/Time    CT Head without Contrast [[id number]] Collected:10/10/13 1157    Order Status:Completed  Updated:10/10/13 1202    Narrative:    CLINICAL INDICATION:  Syncope    TECHNIQUE:  Axial noncontrast CT images were obtained from the skull  base to the vertex.    COMPARISON:  None available    INTERPRETATION: Ventricles and sulcal pattern within normal limits for  age. No acute intracranial hemorrhage, extra-axial collection, or  mass-effect. Gray-white differentiation maintained. Minimal mucosal  thickening of the paranasal sinuses. Small left maxillary sinus mucus  retention cyst versus polyp.           Impression:      No acute intracranial abnormality.    Mitali  Bapna, MD   10/10/2013 11:57 AM    Chest AP Portable [[id number]] Collected:10/10/13 1054    Order Status:Completed  Updated:10/10/13 1059    Narrative:    Clinical history:syncope    TECHNIQUE: AP view the chest was performed    FINDINGS: AP view the chest demonstrates that the heart size prominent  in the aorta is tortuous. The lungs are clear. 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 The chest is unchanged from examination of 05/30/2005. The  subdiaphragmatic region is clear.      Impression:      Skeletal arthritis is noted.  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  Lungs are clear.  The chest is unchanged from the examination of 05/30/2005.    Hal Hope, MD   10/10/2013 10:55 AM           All recent brain and spine imaging  (MRI, CT) personally reviewed.    Case discussed with: patient and ER attending    35 minutes; >50% time spent in counseling or coordination of care    Signed by: Ardelle Anton, MD  Spectralink: [phone]       Answering Service: [phone]

## 2013-10-10 NOTE — ED Notes (Signed)
Pt reports URI symptoms for 3 days; reports nasal cogestion, and general weakness and chills; had presyncopal event this am which ld them to call EMS

## 2013-10-11 ENCOUNTER — Inpatient Hospital Stay: Payer: Medicare Other

## 2013-10-11 DIAGNOSIS — Z90710 Acquired absence of both cervix and uterus: Secondary | ICD-10-CM

## 2013-10-11 DIAGNOSIS — E785 Hyperlipidemia, unspecified: Secondary | ICD-10-CM

## 2013-10-11 DIAGNOSIS — Z9079 Acquired absence of other genital organ(s): Secondary | ICD-10-CM

## 2013-10-11 LAB — HEMOGLOBIN A1C: Hemoglobin A1C: 8.6 % — ABNORMAL HIGH (ref 0.0–6.0)

## 2013-10-11 LAB — BASIC METABOLIC PANEL
Anion Gap: 10 (ref 5.0–15.0)
BUN: 11 mg/dL (ref 7.0–19.0)
CO2: 24 mEq/L (ref 22–29)
Calcium: 8.8 mg/dL (ref 8.5–10.5)
Chloride: 106 mEq/L (ref 98–107)
Creatinine: 0.8 mg/dL (ref 0.6–1.0)
Glucose: 126 mg/dL — ABNORMAL HIGH (ref 70–100)
Potassium: 3.2 mEq/L — ABNORMAL LOW (ref 3.5–5.1)
Sodium: 140 mEq/L (ref 136–145)

## 2013-10-11 LAB — ECG 12-LEAD
Atrial Rate: 77 {beats}/min
Atrial Rate: 66 {beats}/min
P Axis: 60 degrees
P Axis: 54 degrees
P-R Interval: 242 ms
P-R Interval: 202 ms
Q-T Interval: 466 ms
Q-T Interval: 484 ms
QRS Duration: 154 ms
QRS Duration: 154 ms
QTC Calculation (Bezet): 527 ms
QTC Calculation (Bezet): 507 ms
R Axis: -18 degrees
R Axis: -29 degrees
T Axis: 30 degrees
T Axis: 11 degrees
Ventricular Rate: 77 {beats}/min
Ventricular Rate: 66 {beats}/min

## 2013-10-11 LAB — GLUCOSE WHOLE BLOOD - POCT
Whole Blood Glucose POCT: 167 mg/dL — ABNORMAL HIGH (ref 70–100)
Whole Blood Glucose POCT: 250 mg/dL — ABNORMAL HIGH (ref 70–100)
Whole Blood Glucose POCT: 123 mg/dL — ABNORMAL HIGH (ref 70–100)
Whole Blood Glucose POCT: 158 mg/dL — ABNORMAL HIGH (ref 70–100)

## 2013-10-11 LAB — HEMOLYSIS INDEX: Hemolysis Index: 4 (ref 0–18)

## 2013-10-11 LAB — TROPONIN I: Troponin I: <0.01 ng/mL (ref 0.00–0.09)

## 2013-10-11 LAB — GFR: EGFR: >60

## 2013-10-11 MED ORDER — IOHEXOL 350 MG/ML IV SOLN
100.0000 mL | Freq: Once | INTRAVENOUS | Status: AC | PRN
Start: 2013-10-11 — End: 2013-10-11
  Administered 2013-10-11: 100 mL via INTRAVENOUS

## 2013-10-11 MED ORDER — PANTOPRAZOLE SODIUM 40 MG PO TBEC
40.0000 mg | DELAYED_RELEASE_TABLET | Freq: Two times a day (BID) | ORAL | Status: DC
Start: 2013-10-12 — End: 2013-10-11

## 2013-10-11 MED ORDER — PANTOPRAZOLE SODIUM 40 MG PO TBEC
40.0000 mg | DELAYED_RELEASE_TABLET | Freq: Every day | ORAL | Status: DC
Start: 2013-10-11 — End: 2013-10-12
  Administered 2013-10-11 – 2013-10-12 (×2): 40 mg via ORAL
  Filled 2013-10-11 (×2): qty 1

## 2013-10-11 NOTE — Progress Notes (Signed)
Progress Note    Date Time: 10/11/2013 1:01 PM  Patient Name: Helen [name]  Attending Physician: Mosie Epstein, MD    Neurology Follow-Up with Dr. Francesco Sor    Assessment/Plan   HPI:  68 yo female with HX breast CA, HTN, DM asthma who has had URI symptoms (cough, rhinorrhea, subjective fevers/chills) and has generally been feeling weak from this. Day of admission, she was standing when she heard [name] buzzing sensation in her ears and then abruptly lost consciousness. She fell to the ground and apparently quickly recovered herself and had no loss of urinary control or apparent post-ictal state. She denies focal weakness or numbness. Also denies CP, palp, SOB. She is feeling better at this time.    Overnight events:   Orthostatics negative   No new syncopal episodes    Assessment:  Syncope in setting of recent URI.  Unlikely seizure event.    Plan:  1. Continue hydration  2. Continue neuro checks      Physician Addendum : I have seen the patient and examined at length and agree with the above documentation, syncopal event I think was orthostatic event --she is back to baseline --will hold off on any more testing unless new information comes to light     Cathe Mons, MD  Spectra [phone]  Ans Service [phone]     Problem List     Patient Active Problem List   Diagnosis   . Diabetes type 2, uncontrolled   . Hypertension   . Hyperlipidemia   . H/O total hysterectomy with removal of both tubes and ovaries   . Syncope   . Acute viral syndrome   . Heart block AV first degree         Subjective:   Patient Seen and Examined. The notes from the last 24 hours were reviewed.       Review of Systems:   Pt denies pain, ha, n/v, visual changes, weakness.      Physical Exam:     Filed Vitals:    10/11/13 1216   BP: 138/63   Pulse: 70   Temp: 98.1 F (36.7 C)   Resp: 18   SpO2: 97%       PHYSICAL EXAM NEURO:    GCS: 15   4: EOS   5: Oriented   6: Follows commands    Neuro exam:  AA&Ox3  Conversing appropriately  CNI  II-XII  PERRL 3mm to 2mm bilaterally  Muscle strength 5/5:    UE: hand grip/bicep/tricep   LE: foot df/pf  SILT      GENERAL: NAD  HEENT: NCAT  CV: RRR  Pulm: Breathing nonlabored  Extremities: no clubbing, cyanosis  Skin: no rashes or lesions noted    Meds:     Scheduled Meds:  Current Facility-Administered Medications   Medication Dose Route Frequency   . albuterol  2.5 mg Nebulization Q4H SCH   . atorvastatin  40 mg Oral Daily   . ezetimibe  10 mg Oral QHS   . glipiZIDE  10 mg Oral QAM W/BREAKFAST   . pantoprazole  40 mg Oral Daily   . sitaGLIPtin  100 mg Oral Daily   . [DISCONTINUED] amLODIPine  10 mg Oral Daily   . [DISCONTINUED] hydrochlorothiazide  25 mg Oral Daily   . [DISCONTINUED] pantoprazole  40 mg Oral Daily at 0600   . [DISCONTINUED] pantoprazole  40 mg Oral 2XDAY at 0800 & 1200   . [DISCONTINUED] valsartan  160 mg  Oral Daily     Continuous Infusions:     . sodium chloride 100 mL/hr (10/11/13 0200)   . [DISCONTINUED] sodium chloride       PRN Meds:.dextrose, dextrose, glucagon (rDNA), hydrALAZINE, insulin aspart, ondansetron  I personally reviewed all of the medications      Labs:     Results     Procedure Component Value Units Date/Time    Glucose Whole Blood - POCT [[id number]]  (Abnormal) Collected:10/11/13 1230     POCT - Glucose Whole blood 250 (H) mg/dL WUJWJXB:14/78/29 5621    Basic Metabolic Panel (if NOT done in last 48) [[id number]]  (Abnormal) Collected:10/11/13 0117    Specimen Information:Blood Updated:10/11/13 1224     Glucose 126 (H) mg/dL      BUN 96.2 mg/dL      Creatinine 0.8 mg/dL      CALCIUM 8.8 mg/dL      Sodium 952 mEq/L      Potassium 3.2 (L) mEq/L      Chloride 106 mEq/L      CO2 24 mEq/L      Anion Gap 10.0     Hemolysis index [[id number]] Collected:10/11/13 0117     Hemolysis Index 4 Updated:10/11/13 1224    GFR [[id number]] Collected:10/11/13 0117     EGFR >60.0 Updated:10/11/13 1224    Glucose Whole Blood - POCT [[id number]]  (Abnormal) Collected:10/11/13 0656     POCT - Glucose  Whole blood 167 (H) mg/dL DGLOVFI:43/32/95 1884    Hemoglobin A1c [[id number]]  (Abnormal) Collected:10/11/13 0117    Specimen Information:Blood Updated:10/11/13 0624     Hemoglobin A1C 8.6 (H) %     Troponin I [[id number]] Collected:10/11/13 0117    Specimen Information:Blood Updated:10/11/13 0239     Troponin I <0.01 ng/mL     Troponin I [[id number]] Collected:10/11/13 0117    Specimen Information:Blood Updated:10/11/13 0118    Glucose Whole Blood - POCT [[id number]]  (Abnormal) Collected:10/10/13 2143     POCT - Glucose Whole blood 175 (H) mg/dL HCWCBJS:28/31/51 7616    D-Dimer [[id number]]  (Abnormal) Collected:10/10/13 1739     D-Dimer 0.90 (H) ug/mL FEU Updated:10/10/13 1852    Troponin I [[id number]] Collected:10/10/13 1739    Specimen Information:Blood Updated:10/10/13 1850     Troponin I <0.01 ng/mL     UA, Reflex to Microscopic [[id number]]  (Abnormal) Collected:10/10/13 1808    Specimen Information:Urine Updated:10/10/13 1837     Urine Type Clean Catch      Color, UA Straw      Clarity, UA Clear      Specific Gravity UA 1.006      Urine pH 6.0      Leukocyte Esterase, UA Trace ([name])      Nitrite, UA Negative      Protein, UR Negative      Glucose, UA Negative      Ketones UA Negative      Urobilinogen, UA Negative mg/dL      Bilirubin, UA Negative      Blood, UA Negative      RBC, UA 0 - 5 /hpf      WBC, UA 0 - 5 /hpf      Squamous Epithelial Cells, Urine 0 - 5 /hpf     Glucose Whole Blood - POCT [[id number]]  (Abnormal) Collected:10/10/13 1637     POCT - Glucose Whole blood 153 (H) mg/dL VELFYBO:17/51/02 5852    Troponin I [[id number]] Collected:10/10/13 1101    Specimen Information:Blood Updated:10/10/13 1627     Troponin  I <0.01 ng/mL              Radiology Results (24 Hour)     Procedure Component Value Units Date/Time    US Carotid Duplex Dopp Comp Bilateral [[id number]] Collected:10/11/13 1202    Order Status:Completed  Updated:10/11/13 1207    Narrative:    INDICATIONS: Syncope    TECHNIQUE: Duplex evaluation  of the cerebrovasculature is performed with  gray scale color-flow and given Doppler technique from the clavicles to  the angle of the mandible. Stenosis estimation is based on velocity  criteria with denominator of the tubular portion of the distal internal  carotid artery, corresponding with NASCET trial criteria.     COMPARISON STUDY: None available    FINDINGS: Examination of both left and right carotid arteries  demonstrates minor bifurcation plaque without significant stenosis.  There is no significant elevation of systolic velocities proximally  within the internal carotid artery.  Stenosis, both morphologically and  by velocity criteria within the internal carotid arteries is less than  40%. The distal vessels are patent. The external carotid arteries are  patent.    Bilateral antegrade vertebral artery flow is present.      Impression:      1. Minimal plaque right and left carotid bifurcations with no  hemodynamically significant stenosis of the internal carotid arteries  bilaterally.  2. Bilateral antegrade vertebral artery flow.    Dara Lords, MD   10/11/2013 12:03 PM    X-ray ankle right AP and lateral [[id number]] Collected:10/11/13 1122    Order Status:Completed  Updated:10/11/13 1126    Narrative:    Clinical history:hx of fall injury    TECHNIQUE: AP and lateral views of the right ankle were performed.    FINDINGS: AP lateral views the right ankle demonstrate no fracture  dislocation subluxation      Impression:      Normal AP and lateral views of the right ankle.    Hal Hope, MD   10/11/2013 11:22 AM           All brain imaging (MRI, CT) personally reviewed.    Case discussed with: Francesco Sor      Signed by: Barbaraann Share, PA-C  Spectralink: [phone]       Answering Service: [phone]

## 2013-10-11 NOTE — Progress Notes (Signed)
[facility]  PROGRESS NOTE  [facility]       Date Time: 10/11/2013 2:46 PM  Patient Name: Helen [name], Helen [name]       Patient Active Problem List   Diagnosis   . Diabetes type 2, uncontrolled   . Hypertension   . Hyperlipidemia   . H/O total hysterectomy with removal of both tubes and ovaries   . Syncope   . Acute viral syndrome   . 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  Mild RV dilatation on echo with est RVSP 38.  Telemetry - no bradyarrythmias.  Hx HTN -borderline orthostatic on admission.  DM  Elevated D-dimer         Recommendations:    Chest CTA - evaluate for possible PE with elevated D-dimer.      Home Medications:     Prescriptions prior to admission   Medication Sig Dispense Refill   . albuterol (PROVENTIL) (2.5 MG/3ML) 0.083% nebulizer solution Take 2.5 mg by nebulization every 6 (six) hours as needed.       [name] amLODIPine (NORVASC) 10 MG tablet Take 10 mg by mouth daily.       [name] atorvastatin (LIPITOR) 40 MG tablet Take 40 mg by mouth daily.       . Blood Glucose Monitoring Suppl (ONE TOUCH ULTRA 2) W/DEVICE KIT One daily  1 each  0   . ezetimibe (ZETIA) 10 MG tablet Take 10 mg by mouth daily.       [name] glipiZIDE (GLUCOTROL) 5 MG tablet        . glucose blood (ONE TOUCH ULTRA TEST) test strip 2 times daily  200 each  3   . JANUVIA 100 MG tablet        . Lancets (ONETOUCH ULTRASOFT) lancets Use to check blood sugars 2 times daily  200 each  3   . nateglinide (STARLIX) 120 MG tablet Take 120 mg by mouth 3 (three) times daily before meals.       . Oxymetazoline HCl (NASAL SPRAY NA) by Nasal route.       . pantoprazole (PROTONIX) 40 MG tablet Take 40 mg by mouth daily.       . valsartan-hydrochlorothiazide (DIOVAN-HCT) 160-12.5 MG per tablet Take 1 tablet by mouth daily.             Medications:      Scheduled Meds: PRN Meds:    Current Facility-Administered  Medications   Medication Dose Route Frequency   . albuterol  2.5 mg Nebulization Q4H SCH   . atorvastatin  40 mg Oral Daily   . ezetimibe  10 mg Oral QHS   . glipiZIDE  10 mg Oral QAM W/BREAKFAST   . pantoprazole  40 mg Oral Daily   . sitaGLIPtin  100 mg Oral Daily   . [DISCONTINUED] amLODIPine  10 mg Oral Daily   . [DISCONTINUED] hydrochlorothiazide  25 mg Oral Daily   . [DISCONTINUED] pantoprazole  40 mg Oral Daily at 0600   . [DISCONTINUED] pantoprazole  40 mg Oral 2XDAY at 0800 & 1200   . [DISCONTINUED] valsartan  160 mg Oral Daily       Continuous Infusions:       . sodium chloride 100 mL/hr (10/11/13 0200)   . [  DISCONTINUED] sodium chloride           dextrose 15 g PRN   dextrose 25 mL PRN   glucagon (rDNA) 1 mg PRN   hydrALAZINE 10 mg Q6H PRN   insulin aspart 1-12 Units TID AC PRN   ondansetron 4 mg Q8H PRN              Subjective:   Denies chest pain, SOB or palpitations.      Physical Exam:     Filed Vitals:    10/11/13 1216   BP: 138/63   Pulse: 70   Temp: 98.1 F (36.7 C)   Resp: 18   SpO2: 97%     Temp (24hrs), Avg:98 F (36.7 C), Min:97.2 F (36.2 C), Max:98.8 F (37.1 C)       Intake and Output Summary (Last 24 hours) at Date Time  No intake or output data in the 24 hours ending 10/11/13 1446    General Appearance:  Breathing comfortable, no acute distress  Head:  normocephalic  Eyes:  EOM's intact  Neck:  No carotid bruit or jugular venous distension, brisk carotid upstroke  Lungs:  Clear to auscultation throughout, no wheezes, rhonchi or rales, good respiratory effort   Chest Wall:  No tenderness or deformity  Heart:  Reg rhythm, nl s1s2, no murmur or rub  Abdomen:  Soft, non-tender, positive bowel sounds, no hepatojugular reflux  Extremities:  No cyanosis, clubbing or edema  Pulses:  2+ pedal, radial, brachial pulses equal bilateraly  Neurologic:  Alert and oriented x3, mood and affect normal    Labs:     Lab 10/11/13 0117 10/10/13 1739 10/10/13 1101   CK -- -- --   TROPI <0.01 <0.01 <0.01    TROPT -- -- --   CKMBINDEX -- -- --       Lab 10/10/13 1101   BILITOTAL 0.8   BILIDIRECT --   PROT 6.9   ALB 3.6   ALT 20   AST 22     No results found for this basename: MG in the last 168 hours  No results found for this basename: PT,INR,PTT in the last 168 hours    Lab 10/10/13 1101   WBC 7.63   HGB 10.8*   HCT 34.5*   PLT 183       Lab 10/11/13 0117 10/10/13 1101   NA 140 139   K 3.2* 3.4*   CL 106 101   CO2 24 27   BUN 11.0 12.0   CREAT 0.8 0.9   EGFR >60.0 >60.0   GLU 126* 164*   CA 8.8 9.1       Imaging:   Radiological Procedure    Radiology Results (24 Hour)     Procedure Component Value Units Date/Time    US Carotid Duplex Dopp Comp Bilateral [[id number]] Collected:10/11/13 1202    Order Status:Completed  Updated:10/11/13 1207    Narrative:    INDICATIONS: Syncope    TECHNIQUE: Duplex evaluation of the cerebrovasculature is performed with  gray scale color-flow and given Doppler technique from the clavicles to  the angle of the mandible. Stenosis estimation is based on velocity  criteria with denominator of the tubular portion of the distal internal  carotid artery, corresponding with NASCET trial criteria.     COMPARISON STUDY: None available    FINDINGS: Examination of both left and right carotid arteries  demonstrates minor bifurcation plaque without significant stenosis.  There is no significant elevation of systolic velocities  proximally  within the internal carotid artery.  Stenosis, both morphologically and  by velocity criteria within the internal carotid arteries is less than  40%. The distal vessels are patent. The external carotid arteries are  patent.    Bilateral antegrade vertebral artery flow is present.      Impression:      1. Minimal plaque right and left carotid bifurcations with no  hemodynamically significant stenosis of the internal carotid arteries  bilaterally.  2. Bilateral antegrade vertebral artery flow.    Dara Lords, MD   10/11/2013 12:03 PM    X-ray ankle right AP and  lateral [[id number]] Collected:10/11/13 1122    Order Status:Completed  Updated:10/11/13 1126    Narrative:    Clinical history:hx of fall injury    TECHNIQUE: AP and lateral views of the right ankle were performed.    FINDINGS: AP lateral views the right ankle demonstrate no fracture  dislocation subluxation      Impression:      Normal AP and lateral views of the right ankle.    Hal Hope, MD   10/11/2013 11:22 AM                    Telemetry:    SR.        Signed by: Ma Hillock, MD      [facility]  NP [facility] [phone] (8am-5pm)  MD [facility] [phone] (8am-5pm)  After hours, non urgent consult line [phone]  After Hours, urgent consults [phone]

## 2013-10-11 NOTE — Progress Notes (Signed)
MEDICINE PROGRESS NOTE    Date Time: 10/11/2013 6:23 PM  Patient Name: Helen [name]  Attending Physician: Mosie Epstein, MD  Hospital Days:1    Assessment:     Patient Active Problem List    Diagnosis Date Noted   . Syncope 10/10/2013   . Acute viral syndrome 10/10/2013   . Heart block AV first degree 10/10/2013   . H/O total hysterectomy with removal of both tubes and ovaries 10/07/2013   . Diabetes type 2, uncontrolled 03/29/2012   . Hypertension 03/29/2012   . Hyperlipidemia 03/29/2012           Plan:   CT angio negative for PE  S/p Adenosine, continue with current medical management  Glycemic control  Control of hypertension  Statins  DVT and GI prophylaxis   Cardiology f/u            CC: no new complaints          Interval History/24 hour events:   -no chest pain  -SVT resolved  -no shortness of breath        Review of Systems:   General ROS: negative for - chills,  fever  Psychological ROS: negative for - anxiety , depression  Ophthalmic ROS: negative for - blurry vision or eye pain  ENT ROS: negative for - nasal congestion or oral lesions  Allergy and Immunology ROS: negative for - hives  Hematological and Lymphatic ROS: negative for - bleeding problems  Endocrine ROS: negative for - temperature intolerance  Respiratory ROS: no cough, shortness of breath, or wheezing  Cardiovascular ROS: no chest pain or dyspnea on exertion  Gastrointestinal ROS: no abdominal pain, change in bowel habits, or black or bloody stools  Genito-Urinary ROS: no dysuria, trouble voiding, or hematuria  Musculoskeletal ROS: negative for - joint pain or joint swelling  Neurological ROS: negative for - numbness/tingling or weakness  Dermatological ROS: negative for rash      Physical Exam:     Patient Vitals for the past 24 hrs:   BP Temp Temp src Pulse Resp SpO2   10/11/13 1710 119/60 mmHg 98.2 F (36.8 C) - 69  18  97 %   10/11/13 1216 138/63 mmHg 98.1 F (36.7 C) - 70  18  97 %   10/11/13 0804 127/60 mmHg 97.2 F (36.2 C) -  72  17  94 %   10/11/13 0449 111/54 mmHg 97.9 F (36.6 C) Oral 68  16  96 %   10/10/13 2306 120/58 mmHg 98.8 F (37.1 C) Oral 67  18  96 %   10/10/13 1934 107/54 mmHg 98.2 F (36.8 C) Oral 68  16  96 %   10/10/13 1832 118/56 mmHg - - 73  - -   10/10/13 1832 121/57 mmHg - - 68  - -   10/10/13 1831 130/60 mmHg - - 70  - -     Body mass index is 32.18 kg/(m^2).  No intake or output data in the 24 hours ending 10/11/13 1823    General: awake, alert, oriented x 3; no acute distress.  HEENT: perrla, eomi, sclera anicteric  oropharynx clear without lesions, mucous membranes moist  Neck: supple, no lymphadenopathy, no thyromegaly, no JVD, no carotid bruits  Cardiovascular: regular rate and rhythm, no murmurs, rubs or gallops  Lungs: clear to auscultation bilaterally, without wheezing, rhonchi, or rales  Abdomen: soft, non-tender, non-distended; no palpable masses, no hepatosplenomegaly, normoactive bowel sounds, no rebound or guarding  Extremities: no clubbing,  cyanosis, or edema  Neuro: [name]+O x 3, cranial nerves grossly intact, strength 5/5 in upper and lower extremities, sensation intact,   Skin: no rashes or lesions noted  Meds:     Medications were reviewed:    Labs:     Recent Labs   Basename 10/10/13 1101    WBC 7.63    HGB 10.8*    HCT 34.5*    PLT 183    MCV 72.8*       Recent Labs   Basename 10/11/13 0117 10/10/13 1101    NA 140 139    K 3.2* 3.4*    CL 106 101    CO2 24 27    BUN 11.0 12.0    CREAT 0.8 0.9    GLU 126* 164*    CA 8.8 9.1    MG -- --    PHOS -- --       Recent Labs   Basename 10/10/13 1101    AST 22    ALT 20    ALKPHOS 76    PROT 6.9    ALB 3.6       No results found for this basename: PTT:2,PT:2,INR:2 in the last 72 hours    Imaging personally reviewed              Discharge Date: Guarded       Reason to Stay: S/p SVT        Case discussed with: Staff nurse and patient    Mosie Epstein, MD  10/11/2013  6:23 PM

## 2013-10-11 NOTE — Plan of Care (Signed)
Severe Sepsis Screen  Date: 10/11/2013 Time: 9:39 AM  Nurse Signature: Niyam Bisping D Pilar Westergaard    A. Infection:    Does your patient have ONE or more of the following infection criteria?     []  Documented Infection - Does the patient have positive culture results (from blood,        sputum, urine, etc)?   []  Anti-Infective Therapy - Is the patient receiving antibiotic, antifungal, or other                anti-infective therapy?   []  Pneumonia - Is there documentation of pneumonia (X Ray, etc)?   []  WBC's - Have WBC's been found in normally sterile fluid (urine, CSF, etc.)?   []  Perforated Viscus -Does the patient have a perforated hollow organ (bowel)?    A.  Did you check any of the boxes above?    [x]  No  If No, Stop Here and Sepsis Screen Negative.               []  Yes, continue to section B      B. SIRS:     Does your patient have TWO or more of the following SIRS criteria (ensure vital signs & temperature are within 1 hour of this screening)?    []  Temperature - Is the patient's temperature: Temp: 97.2 F (36.2 C) (10/11/13 0804)   - Greater than or equal to 38.3 degrees C (greater than 100.9 degrees F)?   - Less than or equal to 36 degrees C (less than or equal to 96.8 degrees F)?    []  Heart Rate: Heart Rate: 72  (10/11/13 0804)   - Is the patient's heart rate greater than or equal to 90 bpm?    []  Respiratory: Resp Rate: 17  (10/11/13 0804)   - Is the patient's respiratory rate greater than or equal to 20?    []  WBC Count - Is the patient's WBC count:   Lab 10/10/13 1101   WBC 7.63      - Greater than or equal to 12,000/mm3 OR   - Less than or equal to 4,000/mm3 OR    - Are there greater than 10% immature neutrophils (bands)?    []  Glucose >140 without diabetes?   Lab 10/10/13 1101   GLU 164*       []  Significant edema?    B.  Did you check two or more of the boxes above?     []  No, Stop Here and Sepsis Screen Negative   []  Yes, continue to section C    C.  Acute Organ Dysfunction     Does your patient have ONE or  more of the following organ dysfunction? (May need to wait for lab results for assessment - see below) Organ dysfunction must be a result of the sepsis not chronic conditions.    []  Cardiovascular - Does the patient have a: BP: 127/60 mmHg (10/11/13 0804)   -systolic Blood pressure less than or equal to 90 mmHg OR   -systolic blood pressure has dropped 40 mmHg or more from baseline OR   -mean arterial pressure less than or equal to 70 mmHg (for at least one hour   despite fluid resuscitation OR require vasopressor support?  []  Respiratory - Does the patient have new hypoxia defined by any of the following"   -A sustained increase in oxygen requirements by at least 2L/min on NC or 28%    FiO2 within the last 24 hrs OR   -  A persistent decrease in oxygen saturation of greater than or equal to 5% lasting   at least four or more hours and occurring within the last 24 hours  []  Renal - Does the patient have:   -low urine output (e.g. Less than 0.68mL/kg/HR for one hour despite adequate fluid    resuscitation, OR   -Increased creatinine (greater than 50% increase from baseline) OR   -require acute dialysis?  []  Hematologic - Does the patient have a:   -Low platelet count (less than 100,000 mm3)   Lab 10/10/13 1101   PLT 183   OR   -INR/aPTT greater than upper limit of normal?No results found for this basename: INR:1 in the last 168 hours or                No results found for this basename: APTT:1 in the last 168 hours  []  Metabolic - Does the patient have a high lactate (plasma lactate greater than or equal to 2.4 mMol/L)? No results found for this basename: LACTATE:5 in the last 168 hours    []  Hepatic - Are the patient's liver enzymes elevated (ALT greater than 72 IU/L or Total      Bilirubin greater than 2 MG/dL)?    Lab 10/10/13 1101   BILITOTAL 0.8   ALT 20     []  CNS - Does the patient have altered consciousness or reduced Glasgow Coma      Scale?    C.  Did you check any of the boxes above?     []  No, Sepsis Screen  Negative   []  YES:  A) Infection + B) SIRS + C) Organ Dysfunction = Positive Screen for Severe Sepsis     Notify MD and document in Complex Assessment under provider notification   - Name of physician notified:                                           - Date/Time Notifiied:                                             Document actions: Following must be completed within 1 hour of positive sepsis screen   []  Lactate drawn (if initial lactate > , repeat lactate in 2 hours for goal decrease 10-20%)   []  Blood Cultures obtained (prior to antibiotic administration; if not done within the last 48 hours)   []  Antibiotics initiated or modified    []   IV Fluid administered 0.9% NS __________ mLs given (Initial Bolus of 30 ml/kg if SBP < 90 or MAP < 65 or lactate greater than 4 mmol/dl)  Nursing Comments?:     _________________________________________________________________    Patients meeting the following criteria are excluded from screening (check if applicable):   []  [place] hypothermia protocol   []  Comfort Care orders   []  Surgery- No screening for 24 hours after surgery (48 hours after CV surgery)       -If Yes. Date of surgery:______________________   []  Admitted with sepsis and until 72 hours after admission with documented sepsis (RESUME SEPSIS SCREEN AFTER 72 hour window!!!)      -If Yes, Date of Documented Sepsis:______________________   []  Positive screen AND Completed sepsis bundle during previous 24 hours       -  If Yes, Date/Time of positive severe sepsis screen:_______________________

## 2013-10-11 NOTE — Plan of Care (Signed)
Problem: Hemodynamic Status: Cardiac  Goal: Stable vital signs and fluid balance  Outcome: Progressing  Pt is aa/o x3,self care and ambulatory. Denies any pain. RA. NSR with 1st degree AVB and BBB. Breathsounds is clear. Vital signs wnl. Anti-hypertensives on hold at this time per cardiologist.Other POC includes; cardiac enzymes monitoring,12 lead EKG,echocardiogram and carotid dopplers,maintaining pt's blood sugar levels.PT made aware of pt's POC.Pt verbalized understanding.

## 2013-10-12 ENCOUNTER — Encounter: Payer: 59 | Admitting: Family Medicine

## 2013-10-12 LAB — GLUCOSE WHOLE BLOOD - POCT
Whole Blood Glucose POCT: 121 mg/dL — ABNORMAL HIGH (ref 70–100)
Whole Blood Glucose POCT: 195 mg/dL — ABNORMAL HIGH (ref 70–100)

## 2013-10-12 LAB — TROPONIN I: Troponin I: <0.01 ng/mL (ref 0.00–0.09)

## 2013-10-12 NOTE — Plan of Care (Signed)
Severe Sepsis Screen  Date: 10/12/2013 Time: 9:25 AM  Nurse Signature: Hiroko Tregre D Skyley Grandmaison    A. Infection:    Does your patient have ONE or more of the following infection criteria?     []  Documented Infection - Does the patient have positive culture results (from blood,        sputum, urine, etc)?   []  Anti-Infective Therapy - Is the patient receiving antibiotic, antifungal, or other                anti-infective therapy?   []  Pneumonia - Is there documentation of pneumonia (X Ray, etc)?   []  WBC's - Have WBC's been found in normally sterile fluid (urine, CSF, etc.)?   []  Perforated Viscus -Does the patient have a perforated hollow organ (bowel)?    A.  Did you check any of the boxes above?    [x]  No  If No, Stop Here and Sepsis Screen Negative.               []  Yes, continue to section B      B. SIRS:     Does your patient have TWO or more of the following SIRS criteria (ensure vital signs & temperature are within 1 hour of this screening)?    []  Temperature - Is the patient's temperature: Temp: 97.2 F (36.2 C) (10/12/13 0737)   - Greater than or equal to 38.3 degrees C (greater than 100.9 degrees F)?   - Less than or equal to 36 degrees C (less than or equal to 96.8 degrees F)?    []  Heart Rate: Heart Rate: 65  (10/12/13 0737)   - Is the patient's heart rate greater than or equal to 90 bpm?    []  Respiratory: Resp Rate: 18  (10/12/13 0737)   - Is the patient's respiratory rate greater than or equal to 20?    []  WBC Count - Is the patient's WBC count:   Lab 10/10/13 1101   WBC 7.63      - Greater than or equal to 12,000/mm3 OR   - Less than or equal to 4,000/mm3 OR    - Are there greater than 10% immature neutrophils (bands)?    []  Glucose >140 without diabetes?   Lab 10/11/13 0117   GLU 126*       []  Significant edema?    B.  Did you check two or more of the boxes above?     []  No, Stop Here and Sepsis Screen Negative   []  Yes, continue to section C    C.  Acute Organ Dysfunction     Does your patient have ONE or  more of the following organ dysfunction? (May need to wait for lab results for assessment - see below) Organ dysfunction must be a result of the sepsis not chronic conditions.    []  Cardiovascular - Does the patient have a: BP: 119/58 mmHg (10/12/13 0737)   -systolic Blood pressure less than or equal to 90 mmHg OR   -systolic blood pressure has dropped 40 mmHg or more from baseline OR   -mean arterial pressure less than or equal to 70 mmHg (for at least one hour   despite fluid resuscitation OR require vasopressor support?  []  Respiratory - Does the patient have new hypoxia defined by any of the following"   -A sustained increase in oxygen requirements by at least 2L/min on NC or 28%    FiO2 within the last 24 hrs OR   -  A persistent decrease in oxygen saturation of greater than or equal to 5% lasting   at least four or more hours and occurring within the last 24 hours  []  Renal - Does the patient have:   -low urine output (e.g. Less than 0.91mL/kg/HR for one hour despite adequate fluid    resuscitation, OR   -Increased creatinine (greater than 50% increase from baseline) OR   -require acute dialysis?  []  Hematologic - Does the patient have a:   -Low platelet count (less than 100,000 mm3)   Lab 10/10/13 1101   PLT 183   OR   -INR/aPTT greater than upper limit of normal?No results found for this basename: INR:1 in the last 168 hours or                No results found for this basename: APTT:1 in the last 168 hours  []  Metabolic - Does the patient have a high lactate (plasma lactate greater than or equal to 2.4 mMol/L)? No results found for this basename: LACTATE:5 in the last 168 hours    []  Hepatic - Are the patient's liver enzymes elevated (ALT greater than 72 IU/L or Total      Bilirubin greater than 2 MG/dL)?    Lab 10/10/13 1101   BILITOTAL 0.8   ALT 20     []  CNS - Does the patient have altered consciousness or reduced Glasgow Coma      Scale?    C.  Did you check any of the boxes above?     []  No, Sepsis Screen  Negative   []  YES:  A) Infection + B) SIRS + C) Organ Dysfunction = Positive Screen for Severe Sepsis     Notify MD and document in Complex Assessment under provider notification   - Name of physician notified:                                           - Date/Time Notifiied:                                             Document actions: Following must be completed within 1 hour of positive sepsis screen   []  Lactate drawn (if initial lactate > , repeat lactate in 2 hours for goal decrease 10-20%)   []  Blood Cultures obtained (prior to antibiotic administration; if not done within the last 48 hours)   []  Antibiotics initiated or modified    []   IV Fluid administered 0.9% NS __________ mLs given (Initial Bolus of 30 ml/kg if SBP < 90 or MAP < 65 or lactate greater than 4 mmol/dl)  Nursing Comments?:     _________________________________________________________________    Patients meeting the following criteria are excluded from screening (check if applicable):   []  [place] hypothermia protocol   []  Comfort Care orders   []  Surgery- No screening for 24 hours after surgery (48 hours after CV surgery)       -If Yes. Date of surgery:______________________   []  Admitted with sepsis and until 72 hours after admission with documented sepsis (RESUME SEPSIS SCREEN AFTER 72 hour window!!!)      -If Yes, Date of Documented Sepsis:______________________   []  Positive screen AND Completed sepsis bundle during previous 24 hours       -  If Yes, Date/Time of positive severe sepsis screen:_______________________                     Severe Sepsis Screen  Date: 10/12/2013 Time: 9:25 AM  Nurse Signature: Luvenia Cranford D Elysha Daw    A. Infection:    Does your patient have ONE or more of the following infection criteria?     []  Documented Infection - Does the patient have positive culture results (from blood,        sputum, urine, etc)?   []  Anti-Infective Therapy - Is the patient receiving antibiotic, antifungal, or other                 anti-infective therapy?   []  Pneumonia - Is there documentation of pneumonia (X Ray, etc)?   []  WBC's - Have WBC's been found in normally sterile fluid (urine, CSF, etc.)?   []  Perforated Viscus -Does the patient have a perforated hollow organ (bowel)?    A.  Did you check any of the boxes above?    []  No  If No, Stop Here and Sepsis Screen Negative.               []  Yes, continue to section B      B. SIRS:     Does your patient have TWO or more of the following SIRS criteria (ensure vital signs & temperature are within 1 hour of this screening)?    []  Temperature - Is the patient's temperature: Temp: 97.2 F (36.2 C) (10/12/13 0737)   - Greater than or equal to 38.3 degrees C (greater than 100.9 degrees F)?   - Less than or equal to 36 degrees C (less than or equal to 96.8 degrees F)?    []  Heart Rate: Heart Rate: 65  (10/12/13 0737)   - Is the patient's heart rate greater than or equal to 90 bpm?    []  Respiratory: Resp Rate: 18  (10/12/13 0737)   - Is the patient's respiratory rate greater than or equal to 20?    []  WBC Count - Is the patient's WBC count:   Lab 10/10/13 1101   WBC 7.63      - Greater than or equal to 12,000/mm3 OR   - Less than or equal to 4,000/mm3 OR    - Are there greater than 10% immature neutrophils (bands)?    []  Glucose >140 without diabetes?   Lab 10/11/13 0117   GLU 126*       []  Significant edema?    B.  Did you check two or more of the boxes above?     []  No, Stop Here and Sepsis Screen Negative   []  Yes, continue to section C    C.  Acute Organ Dysfunction     Does your patient have ONE or more of the following organ dysfunction? (May need to wait for lab results for assessment - see below) Organ dysfunction must be a result of the sepsis not chronic conditions.    []  Cardiovascular - Does the patient have a: BP: 119/58 mmHg (10/12/13 0737)   -systolic Blood pressure less than or equal to 90 mmHg OR   -systolic blood pressure has dropped 40 mmHg or more from baseline OR   -mean  arterial pressure less than or equal to 70 mmHg (for at least one hour   despite fluid resuscitation OR require vasopressor support?  []  Respiratory - Does the patient have new hypoxia defined by any of  the following"   -A sustained increase in oxygen requirements by at least 2L/min on NC or 28%    FiO2 within the last 24 hrs OR   -A persistent decrease in oxygen saturation of greater than or equal to 5% lasting   at least four or more hours and occurring within the last 24 hours  []  Renal - Does the patient have:   -low urine output (e.g. Less than 0.3mL/kg/HR for one hour despite adequate fluid    resuscitation, OR   -Increased creatinine (greater than 50% increase from baseline) OR   -require acute dialysis?  []  Hematologic - Does the patient have a:   -Low platelet count (less than 100,000 mm3)   Lab 10/10/13 1101   PLT 183   OR   -INR/aPTT greater than upper limit of normal?No results found for this basename: INR:1 in the last 168 hours or                No results found for this basename: APTT:1 in the last 168 hours  []  Metabolic - Does the patient have a high lactate (plasma lactate greater than or equal to 2.4 mMol/L)? No results found for this basename: LACTATE:5 in the last 168 hours    []  Hepatic - Are the patient's liver enzymes elevated (ALT greater than 72 IU/L or Total      Bilirubin greater than 2 MG/dL)?    Lab 10/10/13 1101   BILITOTAL 0.8   ALT 20     []  CNS - Does the patient have altered consciousness or reduced Glasgow Coma      Scale?    C.  Did you check any of the boxes above?     []  No, Sepsis Screen Negative   []  YES:  A) Infection + B) SIRS + C) Organ Dysfunction = Positive Screen for Severe Sepsis     Notify MD and document in Complex Assessment under provider notification   - Name of physician notified:                                           - Date/Time Notifiied:                                             Document actions: Following must be completed within 1 hour of positive  sepsis screen   []  Lactate drawn (if initial lactate > , repeat lactate in 2 hours for goal decrease 10-20%)   []  Blood Cultures obtained (prior to antibiotic administration; if not done within the last 48 hours)   []  Antibiotics initiated or modified    []   IV Fluid administered 0.9% NS __________ mLs given (Initial Bolus of 30 ml/kg if SBP < 90 or MAP < 65 or lactate greater than 4 mmol/dl)  Nursing Comments?:     _________________________________________________________________    Patients meeting the following criteria are excluded from screening (check if applicable):   []  [place] hypothermia protocol   []  Comfort Care orders   []  Surgery- No screening for 24 hours after surgery (48 hours after CV surgery)       -If Yes. Date of surgery:______________________   []  Admitted with sepsis and until 72 hours after admission with documented sepsis (RESUME  SEPSIS SCREEN AFTER 72 hour window!!!)      -If Yes, Date of Documented Sepsis:______________________   []  Positive screen AND Completed sepsis bundle during previous 24 hours       -If Yes, Date/Time of positive severe sepsis screen:_______________________                     Severe Sepsis Screen  Date: 10/12/2013 Time: 9:25 AM  Nurse Signature: Zyann Mabry D Tani Virgo    A. Infection:    Does your patient have ONE or more of the following infection criteria?     []  Documented Infection - Does the patient have positive culture results (from blood,        sputum, urine, etc)?   []  Anti-Infective Therapy - Is the patient receiving antibiotic, antifungal, or other                anti-infective therapy?   []  Pneumonia - Is there documentation of pneumonia (X Ray, etc)?   []  WBC's - Have WBC's been found in normally sterile fluid (urine, CSF, etc.)?   []  Perforated Viscus -Does the patient have a perforated hollow organ (bowel)?    A.  Did you check any of the boxes above?    []  No  If No, Stop Here and Sepsis Screen Negative.               []  Yes, continue to  section B      B. SIRS:     Does your patient have TWO or more of the following SIRS criteria (ensure vital signs & temperature are within 1 hour of this screening)?    []  Temperature - Is the patient's temperature: Temp: 97.2 F (36.2 C) (10/12/13 0737)   - Greater than or equal to 38.3 degrees C (greater than 100.9 degrees F)?   - Less than or equal to 36 degrees C (less than or equal to 96.8 degrees F)?    []  Heart Rate: Heart Rate: 65  (10/12/13 0737)   - Is the patient's heart rate greater than or equal to 90 bpm?    []  Respiratory: Resp Rate: 18  (10/12/13 0737)   - Is the patient's respiratory rate greater than or equal to 20?    []  WBC Count - Is the patient's WBC count:   Lab 10/10/13 1101   WBC 7.63      - Greater than or equal to 12,000/mm3 OR   - Less than or equal to 4,000/mm3 OR    - Are there greater than 10% immature neutrophils (bands)?    []  Glucose >140 without diabetes?   Lab 10/11/13 0117   GLU 126*       []  Significant edema?    B.  Did you check two or more of the boxes above?     []  No, Stop Here and Sepsis Screen Negative   []  Yes, continue to section C    C.  Acute Organ Dysfunction     Does your patient have ONE or more of the following organ dysfunction? (May need to wait for lab results for assessment - see below) Organ dysfunction must be a result of the sepsis not chronic conditions.    []  Cardiovascular - Does the patient have a: BP: 119/58 mmHg (10/12/13 0737)   -systolic Blood pressure less than or equal to 90 mmHg OR   -systolic blood pressure has dropped 40 mmHg or more from baseline OR   -mean arterial pressure  less than or equal to 70 mmHg (for at least one hour   despite fluid resuscitation OR require vasopressor support?  []  Respiratory - Does the patient have new hypoxia defined by any of the following"   -A sustained increase in oxygen requirements by at least 2L/min on NC or 28%    FiO2 within the last 24 hrs OR   -A persistent decrease in oxygen saturation of greater than  or equal to 5% lasting   at least four or more hours and occurring within the last 24 hours  []  Renal - Does the patient have:   -low urine output (e.g. Less than 0.92mL/kg/HR for one hour despite adequate fluid    resuscitation, OR   -Increased creatinine (greater than 50% increase from baseline) OR   -require acute dialysis?  []  Hematologic - Does the patient have a:   -Low platelet count (less than 100,000 mm3)   Lab 10/10/13 1101   PLT 183   OR   -INR/aPTT greater than upper limit of normal?No results found for this basename: INR:1 in the last 168 hours or                No results found for this basename: APTT:1 in the last 168 hours  []  Metabolic - Does the patient have a high lactate (plasma lactate greater than or equal to 2.4 mMol/L)? No results found for this basename: LACTATE:5 in the last 168 hours    []  Hepatic - Are the patient's liver enzymes elevated (ALT greater than 72 IU/L or Total      Bilirubin greater than 2 MG/dL)?    Lab 10/10/13 1101   BILITOTAL 0.8   ALT 20     []  CNS - Does the patient have altered consciousness or reduced Glasgow Coma      Scale?    C.  Did you check any of the boxes above?     []  No, Sepsis Screen Negative   []  YES:  A) Infection + B) SIRS + C) Organ Dysfunction = Positive Screen for Severe Sepsis     Notify MD and document in Complex Assessment under provider notification   - Name of physician notified:                                           - Date/Time Notifiied:                                             Document actions: Following must be completed within 1 hour of positive sepsis screen   []  Lactate drawn (if initial lactate > , repeat lactate in 2 hours for goal decrease 10-20%)   []  Blood Cultures obtained (prior to antibiotic administration; if not done within the last 48 hours)   []  Antibiotics initiated or modified    []   IV Fluid administered 0.9% NS __________ mLs given (Initial Bolus of 30 ml/kg if SBP < 90 or MAP < 65 or lactate greater than 4  mmol/dl)  Nursing Comments?:     _________________________________________________________________    Patients meeting the following criteria are excluded from screening (check if applicable):   []  [place] hypothermia protocol   []  Comfort Care orders   []  Surgery- No screening for  24 hours after surgery (48 hours after CV surgery)       -If Yes. Date of surgery:______________________   []  Admitted with sepsis and until 72 hours after admission with documented sepsis (RESUME SEPSIS SCREEN AFTER 72 hour window!!!)      -If Yes, Date of Documented Sepsis:______________________   []  Positive screen AND Completed sepsis bundle during previous 24 hours       -If Yes, Date/Time of positive severe sepsis screen:_______________________

## 2013-10-12 NOTE — Discharge Summary (Signed)
DISCHARGE SUMMARY      Date Time: 10/12/2013 2:42 PM  Patient Name: Helen [name]  Attending Physician: Mosie Epstein, MD  Primary Care Physician: Randolph Bing, MD    Date of Admission:   10/10/2013    Date of Discharge:   10/12/2013    Discharge Dx:   Active Problems:   Diabetes type 2, uncontrolled   Hypertension   Hyperlipidemia   H/O total hysterectomy with removal of both tubes and ovaries   Syncope   Acute viral syndrome   Heart block AV first degree  Resolved Problems:   * No resolved hospital problems. *       Consultations:   Treatment Team: Attending Provider: Mosie Epstein, MD; Consulting Physician: Mosie Epstein, MD; Consulting Physician: Ma Hillock, MD; Consulting Physician: Ardelle Anton, MD; Registered Nurse: Janann August, RN; Technician: Greer Pickerel Respiratory Care Practitioner: Fulton Mole, RCP; Case Manager: Ivar Drape, RN     Procedures/Radiology performed:      CBC    Lab 10/10/13 1101   WBC 7.63   HGB 10.8*   HCT 34.5*   PLT 183       CMP    Lab 10/11/13 0117 10/10/13 1101   NA 140 139   K 3.2* 3.4*   CL 106 101   CO2 24 27   BUN 11.0 12.0   CREAT 0.8 0.9   GLU 126* 164*   CA 8.8 9.1   MG -- --   PHOS -- --   PROT -- 6.9   ALB -- 3.6   AST -- 22   ALT -- 20   ALKPHOS -- 76   BILITOTAL -- 0.8       Lipid panel  No results found for this basename: CHOL,TRIG,HDL,LDL in the last 168 hours      Lab 10/11/13 0117   HGBA1C 8.6*       Cardiac enzymes    Lab 10/12/13 0623 10/11/13 0117 10/10/13 1739   CK -- -- --   TROPI <0.01 <0.01 <0.01   TROPT -- -- --   CKMBINDEX -- -- --       Echocardiogram Adult Complete W Clr/ Dopp Waveform    10/11/2013  [facility] [address] [place], [place] [address] [phone]  Transthoracic Echocardiogram 2D, M-mode, Doppler, and Color Doppler Study date:  11-Oct-2013  Patient: Helen [name] MR #: [id number] Account #: [id number] DOB: Sep 22, 1945 Age: 68 years Gender: Female Height: 62 in Weight: 175.6 lb BSA: 1.81 m BP:  127/ 60  The echocardiogram meets appropriate use criteria.  Allergies: ASPIRIN  Sonographer:  Bubba Camp, RDCS Reading Physician Group:  _[facility] Reading Physician:  Rocco Serene, MD  CLINICAL QUESTION: Assess left ventricular function.  HISTORY: Syncope. PRIOR HISTORY: DM II and 1st degree AV Block. Risk factors: hypertension.  PROCEDURE: The procedure was performed in the echo lab. This was [name] routine study. HR= 72 BPM, NSR. BP 127/60. No prior echocardiogram history. The transthoracic approach was used. The study included complete 2D imaging, M-mode, complete spectral Doppler, and color Doppler. Image quality was good.  IMPRESSIONS: No intracardiac mass, thrombus or vegetation was noted. Mild RV dilatation and minimal elevation est RVSP, mild TR.  SUMMARY:  -  Left ventricle: -  Size was normal. -  Systolic function was normal. Ejection fraction was estimated in the range of 55 % to 65 %. -  There were no regional wall motion abnormalities. -  Wall thickness was normal. -  The transmitral flow pattern was normal. -  Left ventricular diastolic function parameters were normal. -  There was no evidence of elevated ventricular filling pressure by Doppler parameters.  -  Right ventricle: -  The ventricle was mildly dilated.  -  Pulmonary arteries: -  Systolic pressure was mildly increased. Estimated peak pressure was at least 38 mmHg.  -  Tricuspid valve: -  There was mild regurgitation.  SUMMARY MEASUREMENTS 2D measurements: Unspecified Anatomy:   LVEDV MOD A4C was 98.2 ml.  LVEF MOD A4C was 76.6 %.  LVESV MOD A4C was 23 ml.  LVLd A4C was 8.1 cm.  LVLs A4C was 6 cm.  SV MOD A4C was 75.2 ml. 2D mode measurements: Right Ventricle:   RVIDd was 43.6 mm. CW measurements: Unspecified Anatomy:   AV Vmax was 1.8 m/s.  AV maxPG was 12.3 mmHg.  PV Vmax was 1 m/s.  PV maxPG was 4.3 mmHg.  RAP was 5 mmHg.  TR Vmax was 2.6 m/s.  TR maxPG was 27.3 mmHg. MM measurements: Unspecified Anatomy:   %FS was 44.8 %.  AV  Cusp was 2.1 cm.  Ao Diam was 3.5 cm.  EDV(Teich) was 145 ml.  EF(Teich) was 75.6 %.  ESV(Teich) was 35.4 ml.  IVSd was 0.9 cm.  LA Diam was 3.5 cm.  LVIDd was 5.5 cm.  LVIDs was 3 cm.  LVPWd was 0.9 cm.  SV(Teich) was 109.6 ml. PW measurements: Unspecified Anatomy:   E' was 0.1 m/s.  E/E' was 13.1 .  LVOT Vmax was 1.2 m/s.  LVOT maxPG was 5.5 mmHg.  MV [name] Vel was 1.1 m/s.  MV Dec Slope was 5.7 m/s2.  MV DecT was 199.2 ms.  MV E Vel was 1.1 m/s.  MV E/[name] Ratio was 1 .  MV PHT was 57.8 ms.  MVA By PHT was 3.8 cm2.  RVSP was 32.3 mmHg.  TV E Vel was 0.6 m/s.  FINDINGS:  LEFT VENTRICLE: Size was normal. Systolic function was normal. Ejection fraction was estimated in the range of 55 % to 65 %. There were no regional wall motion abnormalities. Wall thickness was normal. Doppler: The transmitral flow pattern was normal. Left ventricular diastolic function parameters were normal. There was no evidence of elevated ventricular filling pressure by Doppler parameters.  VENTRICULAR SEPTUM: No ventricular shunt was identified.  RIGHT VENTRICLE: The ventricle was mildly dilated. Systolic function was normal. Wall thickness was normal.  LEFT ATRIUM: Size was normal.  ATRIAL SEPTUM: There was no left-to-right shunt and no right-to-left shunt.  RIGHT ATRIUM: Size was normal.  AORTIC VALVE: The valve was trileaflet. Leaflets exhibited normal thickness and normal cuspal separation. Doppler: Transaortic velocity was within the normal range. There was no stenosis. There was no regurgitation.  MITRAL VALVE: Valve structure was normal. There was normal leaflet separation. No echocardiographic evidence for prolapse. Doppler: The transmitral velocity was within the normal range. There was no evidence for stenosis. There was no regurgitation.  TRICUSPID VALVE: The valve structure was normal. There was normal leaflet separation. Doppler: The transtricuspid velocity was within the normal range. There was no evidence for tricuspid stenosis.  There was mild regurgitation.  PULMONIC VALVE: Leaflets exhibited normal thickness, no calcification, and normal cuspal separation. Doppler: The transpulmonic velocity was within the normal range. There was no regurgitation.  AORTA: The root exhibited normal size.  PULMONARY ARTERY: The size was normal. Doppler: Systolic pressure was mildly increased. Estimated peak pressure was at  least 38 mmHg.  SYSTEMIC VEINS: IVC: The inferior vena cava was normal in size.  PERICARDIUM: There was no pericardial effusion.  SYSTEM MEASUREMENT TABLES  2D LVEDV MOD A4C: 98.2 ml LVEF MOD A4C: 76.6 % LVESV MOD A4C: 23 ml LVLd A4C: 8.1 cm LVLs A4C: 6 cm SV MOD A4C: 75.2 ml  2D mode RVIDd: 43.6 mm  CW AV Vmax: 1.8 m/s AV maxPG: 12.3 mmHg PV Vmax: 1 m/s PV maxPG: 4.3 mmHg RAP: 5 mmHg TR Vmax: 2.6 m/s TR maxPG: 27.3 mmHg  MM %FS: 44.8 % AV Cusp: 2.1 cm Ao Diam: 3.5 cm EDV(Teich): 145 ml EF(Teich): 75.6 % ESV(Teich): 35.4 ml IVSd: 0.9 cm LA Diam: 3.5 cm LVIDd: 5.5 cm LVIDs: 3 cm LVPWd: 0.9 cm SV(Teich): 109.6 ml  PW E': 0.1 m/s E/E': 13.1 LVOT Vmax: 1.2 m/s LVOT maxPG: 5.5 mmHg MV [name] Vel: 1.1 m/s MV Dec Slope: 5.7 m/s2 MV DecT: 199.2 ms MV E Vel: 1.1 m/s MV E/[name] Ratio: 1 MV PHT: 57.8 ms MVA By PHT: 3.8 cm2 RVSP: 32.3 mmHg TV E Vel: 0.6 m/s  Prepared and signed by  Rocco Serene, MD Signed 11-Oct-2013 14:00:07      X-ray Ankle Right Ap And Lateral    10/11/2013  Clinical history:hx of fall injury  TECHNIQUE: AP and lateral views of the right ankle were performed.  FINDINGS: AP lateral views the right ankle demonstrate no fracture dislocation subluxation      10/11/2013   Normal AP and lateral views of the right ankle.  Hal Hope, MD  10/11/2013 11:22 AM     Ct Head Without Contrast    10/10/2013  CLINICAL INDICATION:  Syncope  TECHNIQUE:  Axial noncontrast CT images were obtained from the skull base to the vertex.  COMPARISON:  None available  INTERPRETATION: Ventricles and sulcal pattern within normal limits for age. No acute  intracranial hemorrhage, extra-axial collection, or mass-effect. Gray-white differentiation maintained. Minimal mucosal thickening of the paranasal sinuses. Small left maxillary sinus mucus retention cyst versus polyp.           10/10/2013    No acute intracranial abnormality.  Mitali  Bapna, MD  10/10/2013 11:57 AM     Ct Angiogram Chest    10/11/2013  CLINICAL INDICATION: Elevated d-dimer right bundle branch block and syncope  COMPARISON: Chest x-ray from yesterday  TECHNIQUE:  Helical CT scan of the chest was obtained from the apices to the lung bases after the uneventful administration of 100 cc of nonionic intravenous Omnipaque 350 contrast. 3D reformatted  images in the coronal plane were also reviewed.  FINDINGS: There is no evidence of pulmonary embolus.  There is no  evidence of aortic dissection or aneurysmal dilatation. The trachea bronchial tree is patent centrally.  The lungs are clear bilaterally. There is no mediastinal or hilar lymphadenopathy. There is no pleural or pericardial effusion.  Mild to moderate degenerative changes of the spine are noted left ventricle appears enlarged.  Images through the upper abdomen are unremarkable.      10/11/2013   No pulmonary emboli are seen   Laurena Slimmer, MD  10/11/2013 4:20 PM     Chest Ap Portable    10/10/2013  Clinical history:syncope  TECHNIQUE: AP view the chest was performed  FINDINGS: AP view the chest demonstrates that the heart size prominent in the aorta is tortuous. The lungs are clear. 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 The chest is unchanged from examination of 05/30/2005. The subdiaphragmatic region is clear.  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 Lungs are clear. The chest is unchanged from the examination of 05/30/2005.  Hal Hope, MD  10/10/2013 10:55 AM     US Carotid Duplex Dopp Comp Bilateral    10/11/2013  INDICATIONS: Syncope  TECHNIQUE: Duplex evaluation of the cerebrovasculature is performed with gray scale  color-flow and given Doppler technique from the clavicles to the angle of the mandible. Stenosis estimation is based on velocity criteria with denominator of the tubular portion of the distal internal carotid artery, corresponding with NASCET trial criteria.   COMPARISON STUDY: None available  FINDINGS: Examination of both left and right carotid arteries demonstrates minor bifurcation plaque without significant stenosis. There is no significant elevation of systolic velocities proximally within the internal carotid artery.  Stenosis, both morphologically and by velocity criteria within the internal carotid arteries is less than 40%. The distal vessels are patent. The external carotid arteries are patent.  Bilateral antegrade vertebral artery flow is present.      10/11/2013   1. Minimal plaque right and left carotid bifurcations with no hemodynamically significant stenosis of the internal carotid arteries bilaterally. 2. Bilateral antegrade vertebral artery flow.  Dara Lords, MD  10/11/2013 12:03 PM        Hospital Course:   GRACELAND WACHTER is [name] 68 y.o. female BIBA s/p syncopal episode. The pt fell against [name] wall and was unresponsive for 30 seconds. Pt has had URI sxs for 3 days. Associated sxs include congestion, generalized weakness, chills, diffuse myalgia, rhinorrhea, and subjective fever. Pt denies N/V/D, CP, abdominal pain, dysuria, or taking any hormone pills. Hx of DM type II. Pt's grandson is sick with similar sxs. Patient evaluated to be lethargic, fatigue. No chest pain or palpitations admitted for further evaluation s/p syncope. Work up negative for acute stroke or intracranial pathology. Patient cleared by neurology and was discharged in stable condition.            Discharge Medications:        Medication List       As of 10/12/2013  2:42 PM      CONTINUE taking these medications           albuterol (2.5 MG/3ML) 0.083% nebulizer solution    Commonly known as: PROVENTIL        amLODIPine 10 MG tablet     Commonly known as: NORVASC        atorvastatin 40 MG tablet    Commonly known as: LIPITOR        ezetimibe 10 MG tablet    Commonly known as: ZETIA        glipiZIDE 5 MG tablet    Commonly known as: GLUCOTROL        glucose blood test strip    2 times daily        JANUVIA 100 MG tablet    Generic drug: sitaGLIPtin        NASAL SPRAY NA        nateglinide 120 MG tablet    Commonly known as: STARLIX        ONE TOUCH ULTRA 2 W/DEVICE Kit    One daily        onetouch ultrasoft lancets    Use to check blood sugars 2 times daily        pantoprazole 40 MG tablet    Commonly known as: PROTONIX        valsartan-hydrochlorothiazide 160-12.5 MG  per tablet    Commonly known as: DIOVAN-HCT               Discharge Instructions:       Disposition:  home    Patient was instructed to follow up with Primary Care Doctor Randolph Bing, MD in 1 month and with cardiology in 2 weeks    Minutes spent coordinating discharge and reviewing discharge plan:35 minutes      Signed by: Mosie Epstein, MD    CC: Randolph Bing, MD

## 2013-10-12 NOTE — Plan of Care (Signed)
Severe Sepsis Screen  Date: 10/12/2013 Time: 6:07 AM  Nurse Signature: Janann August    A. Infection:    Does your patient have ONE or more of the following infection criteria?     []  Documented Infection - Does the patient have positive culture results (from blood,        sputum, urine, etc)?   []  Anti-Infective Therapy - Is the patient receiving antibiotic, antifungal, or other                anti-infective therapy?   []  Pneumonia - Is there documentation of pneumonia (X Ray, etc)?   []  WBC's - Have WBC's been found in normally sterile fluid (urine, CSF, etc.)?   []  Perforated Viscus -Does the patient have a perforated hollow organ (bowel)?    A.  Did you check any of the boxes above?    [x]  No  If No, Stop Here and Sepsis Screen Negative.               []  Yes, continue to section B      B. SIRS:     Does your patient have TWO or more of the following SIRS criteria (ensure vital signs & temperature are within 1 hour of this screening)?    []  Temperature - Is the patient's temperature: Temp: 97 F (36.1 C) (10/12/13 0553)   - Greater than or equal to 38.3 degrees C (greater than 100.9 degrees F)?   - Less than or equal to 36 degrees C (less than or equal to 96.8 degrees F)?    []  Heart Rate: Heart Rate: 77  (10/12/13 0553)   - Is the patient's heart rate greater than or equal to 90 bpm?    []  Respiratory: Resp Rate: 17  (10/12/13 0553)   - Is the patient's respiratory rate greater than or equal to 20?    []  WBC Count - Is the patient's WBC count:   Lab 10/10/13 1101   WBC 7.63      - Greater than or equal to 12,000/mm3 OR   - Less than or equal to 4,000/mm3 OR    - Are there greater than 10% immature neutrophils (bands)?    []  Glucose >140 without diabetes?   Lab 10/11/13 0117   GLU 126*       []  Significant edema?    B.  Did you check two or more of the boxes above?     []  No, Stop Here and Sepsis Screen Negative   []  Yes, continue to section C    C.  Acute Organ Dysfunction     Does your patient have ONE or more  of the following organ dysfunction? (May need to wait for lab results for assessment - see below) Organ dysfunction must be a result of the sepsis not chronic conditions.    []  Cardiovascular - Does the patient have a: BP: 125/60 mmHg (10/12/13 0553)   -systolic Blood pressure less than or equal to 90 mmHg OR   -systolic blood pressure has dropped 40 mmHg or more from baseline OR   -mean arterial pressure less than or equal to 70 mmHg (for at least one hour   despite fluid resuscitation OR require vasopressor support?  []  Respiratory - Does the patient have new hypoxia defined by any of the following"   -A sustained increase in oxygen requirements by at least 2L/min on NC or 28%    FiO2 within the last 24 hrs OR   -  A persistent decrease in oxygen saturation of greater than or equal to 5% lasting   at least four or more hours and occurring within the last 24 hours  []  Renal - Does the patient have:   -low urine output (e.g. Less than 0.28mL/kg/HR for one hour despite adequate fluid    resuscitation, OR   -Increased creatinine (greater than 50% increase from baseline) OR   -require acute dialysis?  []  Hematologic - Does the patient have a:   -Low platelet count (less than 100,000 mm3)   Lab 10/10/13 1101   PLT 183   OR   -INR/aPTT greater than upper limit of normal?No results found for this basename: INR:1 in the last 168 hours or                No results found for this basename: APTT:1 in the last 168 hours  []  Metabolic - Does the patient have a high lactate (plasma lactate greater than or equal to 2.4 mMol/L)? No results found for this basename: LACTATE:5 in the last 168 hours    []  Hepatic - Are the patient's liver enzymes elevated (ALT greater than 72 IU/L or Total      Bilirubin greater than 2 MG/dL)?    Lab 10/10/13 1101   BILITOTAL 0.8   ALT 20     []  CNS - Does the patient have altered consciousness or reduced Glasgow Coma      Scale?    C.  Did you check any of the boxes above?     []  No, Sepsis Screen  Negative   []  YES:  A) Infection + B) SIRS + C) Organ Dysfunction = Positive Screen for Severe Sepsis     Notify MD and document in Complex Assessment under provider notification   - Name of physician notified:                                           - Date/Time Notifiied:                                             Document actions: Following must be completed within 1 hour of positive sepsis screen   []  Lactate drawn (if initial lactate > , repeat lactate in 2 hours for goal decrease 10-20%)   []  Blood Cultures obtained (prior to antibiotic administration; if not done within the last 48 hours)   []  Antibiotics initiated or modified    []   IV Fluid administered 0.9% NS __________ mLs given (Initial Bolus of 30 ml/kg if SBP < 90 or MAP < 65 or lactate greater than 4 mmol/dl)  Nursing Comments?:     _________________________________________________________________    Patients meeting the following criteria are excluded from screening (check if applicable):   []  [place] hypothermia protocol   []  Comfort Care orders   []  Surgery- No screening for 24 hours after surgery (48 hours after CV surgery)       -If Yes. Date of surgery:______________________   []  Admitted with sepsis and until 72 hours after admission with documented sepsis (RESUME SEPSIS SCREEN AFTER 72 hour window!!!)      -If Yes, Date of Documented Sepsis:______________________   []  Positive screen AND Completed sepsis bundle during previous 24 hours       -  If Yes, Date/Time of positive severe sepsis screen:_______________________

## 2013-10-12 NOTE — Discharge Instructions (Signed)
Date Time: 10/12/2013 5:05 PM  Attending Physician: Mosie Epstein, MD    Date of Admission:   10/10/2013    Reason for Admission:   Syncope [780.2]  Acute viral syndrome [079.99]  Syncope    Follow up:        Neurology (If Stroke): ______________________  Phone:______________________    Other:___________________________________   Phone: ______________________    If you are taking Warfarin, please follow up with (health professional/clinic) ___________ on _____________to have your PT/INR blood test checked.          Medications:    Your medications have been listed for you on the Medication Reconciliation Discharge Home List. Please bring a copy of all discharge instructions, including your Medication Reconciliation Discharge Home List when you visit your physician.      Continue taking all medications even if you feel well, unless otherwise instructed by physician.    Do not take any over-the-counter medications or herbal supplements without checking with your pharmacist or doctor.     Activity:    Rise slowly from a sitting or lying position. Increase activity slowly, unless otherwise instructed by physician.    Perform exercises as desginated by Therapist and Physician.    In the event of severe shortness of breath or chest discomfort, call 911. Do NOT drive to the hospital.    Speak with your physician regarding specific driving and/or work restrictions.    Diet:    If you have special diet orders, you have been given printed diet instructions.   ________________________________________________________________________    Tobacco Cessation Counseling:  If you are currently a tobacco user or have used tobacco within the last 12 months, we have provided you with written Tobacco Cessation Counseling.  ________________________________________________________________________    Heart Failure Education:  If you have a diagnosis of a Heart Failure, we have provided you with written Heart Failure Education.     Weigh  yourself once a day at the same time. Record and bring the weight record to your next physician appointment.     Call your doctor if you gain more than 3 pounds in one day or 5 pounds in one week, or if you experience shortness of breath, leg swelling and/or chest discomfort.     Enroll in the [facility] Tel-Assurance Program, a heart failure patient support program. Call [phone] for additional details.   ________________________________________________________________________    Diamond Nickel Education:    Call 911 for:    Sudden numbness or weakness of the face    Sudden numbness of the arm or leg especially one side of the body    Sudden confusion, trouble speaking or understanding    Sudden trouble seeing in one or both eyes    Sudden trouble walking or dizziness, loss of balance or coordination        For promotion of your health, we have provided you with personalized written education on risk factors specific to your diagnosis, including but not limited to:    High Blood Pressure    High Cholesterol    Atrial Fibrillation    Overweight    Diabetes    Smoking         Vaccinations  Pneumonia Vaccine Received on:  Flu Vaccine Received on:             Treatments/Special Instructions:                  Signed by: Thornton Park, RN    I HAVE RECEIVED  AND UNDERSTAND THESE DISCHARGE INSTRUCTIONS.

## 2013-10-12 NOTE — Plan of Care (Signed)
Problem: Safety  Goal: Patient will be free from injury during hospitalization  Intervention: Provide and maintain safe environment  A/o3-4 ambulates to the bathroom with minimal assistance   call bell within reach, no falls no injury.

## 2013-10-12 NOTE — Plan of Care (Signed)
Problem: Safety  Goal: Patient will be free from injury during hospitalization  Outcome: Progressing  STATUS:Maintain hourly rounding, encouraged to use call bell for assistance. Provide and maintain safe environment. Ensure call bell and telephone within reach.   PLAN: Continue to assess risk for fall and implement fall prevention, continue hourly rounding     Comments:   Pt discharge instructions given, patient verbalizes understanding of discharge instructions, removed saline lock, discontinue telemetry.  Reminded patient call for follow up with primary doctor.  Pt waiting her family for pick up

## 2013-10-12 NOTE — Progress Notes (Signed)
[facility]  PROGRESS NOTE  [facility]       Date Time: 10/12/2013 1:30 PM  Patient Name: Helen [name], Helen [name]       Patient Active Problem List   Diagnosis   . Diabetes type 2, uncontrolled   . Hypertension   . Hyperlipidemia   . H/O total hysterectomy with removal of both tubes and ovaries   . Syncope   . Acute viral syndrome   . Heart block AV first degree              Assessment:    Syncope   Mild RV dilatation on echo with est RVSP 38. EF-Normal. Chest CT - negative for PE.   Telemetry - no bradyarrythmias.  Hx HTN   DM  Carotid doppler - minimal disease only.         Recommendations:    OK to discharge from [name] cardiac standpoint   F/u visit in 2 weeks. Our office will call to arrange..      Home Medications:     Prescriptions prior to admission   Medication Sig Dispense Refill   . albuterol (PROVENTIL) (2.5 MG/3ML) 0.083% nebulizer solution Take 2.5 mg by nebulization every 6 (six) hours as needed.       [name] amLODIPine (NORVASC) 10 MG tablet Take 10 mg by mouth daily.       [name] atorvastatin (LIPITOR) 40 MG tablet Take 40 mg by mouth daily.       . Blood Glucose Monitoring Suppl (ONE TOUCH ULTRA 2) W/DEVICE KIT One daily  1 each  0   . ezetimibe (ZETIA) 10 MG tablet Take 10 mg by mouth daily.       [name] glipiZIDE (GLUCOTROL) 5 MG tablet        . glucose blood (ONE TOUCH ULTRA TEST) test strip 2 times daily  200 each  3   . JANUVIA 100 MG tablet        . Lancets (ONETOUCH ULTRASOFT) lancets Use to check blood sugars 2 times daily  200 each  3   . nateglinide (STARLIX) 120 MG tablet Take 120 mg by mouth 3 (three) times daily before meals.       . Oxymetazoline HCl (NASAL SPRAY NA) by Nasal route.       . pantoprazole (PROTONIX) 40 MG tablet Take 40 mg by mouth daily.       . valsartan-hydrochlorothiazide (DIOVAN-HCT) 160-12.5 MG per tablet Take 1 tablet by mouth daily.             Medications:      Scheduled Meds: PRN Meds:    Current Facility-Administered Medications   Medication Dose Route Frequency   .  albuterol  2.5 mg Nebulization Q4H SCH   . atorvastatin  40 mg Oral Daily   . ezetimibe  10 mg Oral QHS   . glipiZIDE  10 mg Oral QAM W/BREAKFAST   . pantoprazole  40 mg Oral Daily   . sitaGLIPtin  100 mg Oral Daily       Continuous Infusions:       . [EXPIRED] sodium chloride 100 mL/hr (10/11/13 0200)         dextrose 15 g PRN   dextrose 25 mL PRN   glucagon (rDNA) 1 mg PRN   hydrALAZINE 10 mg Q6H PRN   insulin aspart 1-12 Units TID AC PRN   [COMPLETED] iohexol 100 mL ONCE PRN   [EXPIRED] ondansetron 4 mg Q8H PRN  Subjective:   Denies chest pain, SOB or palpitations.      Physical Exam:     Filed Vitals:    10/12/13 1146   BP: 148/67   Pulse: 66   Temp: 98.1 F (36.7 C)   Resp: 18   SpO2: 98%     Temp (24hrs), Avg:97.7 F (36.5 C), Min:97 F (36.1 C), Max:98.4 F (36.9 C)       Intake and Output Summary (Last 24 hours) at Date Time    Intake/Output Summary (Last 24 hours) at 10/12/13 1330  Last data filed at 10/11/13 1800   Gross per 24 hour   Intake   1000 ml   Output      0 ml   Net   1000 ml       General Appearance:  Breathing comfortable, no acute distress  Head:  normocephalic  Eyes:  EOM's intact  Lungs:  good respiratory effort   Heart:  Reg rhythm, nl s1s2,   Neurologic:  Alert and oriented x3, mood and affect normal    Labs:       Lab 10/12/13 0623 10/11/13 0117 10/10/13 1739   CK -- -- --   TROPI <0.01 <0.01 <0.01   TROPT -- -- --   CKMBINDEX -- -- --       Lab 10/10/13 1101   BILITOTAL 0.8   BILIDIRECT --   PROT 6.9   ALB 3.6   ALT 20   AST 22     No results found for this basename: MG in the last 168 hours  No results found for this basename: PT,INR,PTT in the last 168 hours    Lab 10/10/13 1101   WBC 7.63   HGB 10.8*   HCT 34.5*   PLT 183       Lab 10/11/13 0117 10/10/13 1101   NA 140 139   K 3.2* 3.4*   CL 106 101   CO2 24 27   BUN 11.0 12.0   CREAT 0.8 0.9   EGFR >60.0 >60.0   GLU 126* 164*   CA 8.8 9.1       Imaging:   Radiological Procedure    Radiology Results (24 Hour)      Procedure Component Value Units Date/Time    CT Angiogram Chest [[id number]] Collected:10/11/13 1619    Order Status:Completed  Updated:10/11/13 1624    Narrative:    CLINICAL INDICATION: Elevated d-dimer right bundle branch block and  syncope    COMPARISON: Chest x-ray from yesterday    TECHNIQUE:  Helical CT scan of the chest was obtained from the apices to  the lung bases after the uneventful administration of 100 cc of nonionic  intravenous Omnipaque 350 contrast. 3D reformatted  images in the  coronal plane were also reviewed.    FINDINGS: There is no evidence of pulmonary embolus.  There is no   evidence of aortic dissection or aneurysmal dilatation. The trachea  bronchial tree is patent centrally.  The lungs are clear bilaterally.  There is no mediastinal or hilar lymphadenopathy. There is no pleural or  pericardial effusion.  Mild to moderate degenerative changes of the  spine are noted left ventricle appears enlarged.    Images through the upper abdomen are unremarkable.      Impression:     No pulmonary emboli are seen      Laurena Slimmer, MD   10/11/2013 4:20 PM  Telemetry:    SR.        Signed by: Sandria Senter, MD      [facility]  NP Spectralink [phone] (8am-5pm)  MD Spectralink [phone] (8am-5pm)  After hours, non urgent consult line [phone]  After Hours, urgent consults [phone]

## 2013-10-13 ENCOUNTER — Telehealth: Payer: Self-pay

## 2013-10-13 NOTE — Telephone Encounter (Signed)
Transitional Care Management    1st call attempted to  Helen [name] @ [phone] [facility]) to engage with patient to explained  [facility] Services as a  bridge between hospital and home, act as point of access,   and offer support for patient and family and to see if Patient is interested in participating and receiving weekly telephonic engagements. left voicemail message with [facility] name and contact information.       Sehaj Kolden B. [facility] Coach  Transitional Care Management  [facility]  [address]  [place], [place] [address]  T [phone] F [phone]

## 2013-10-16 ENCOUNTER — Telehealth: Payer: Self-pay

## 2013-10-16 NOTE — Telephone Encounter (Signed)
Transitional Care Management       2nd  call attempted to RUBBIE GOOSTREE @ [phone] (H) to engage with patient to explained [facility] Services as a bridge between hospital and home, act as point of access,   and offer support for patient and family and to see if Patient is interested in participating and receiving weekly telephonic engagements. left voicemail message with [facility] name and contact information.         Amedio Bowlby B. [facility] Coach   Transitional Care Management   [facility]   [address]   [place], [place] [address]   T [phone] F [phone]

## 2013-10-21 NOTE — Progress Notes (Signed)
Transitional Care Management    Enrollment-    Name/Number of person who participated in call:  MAELA TAKEDA @ [phone] (M)  TCM Program explained and patient consents to participate in program: YES, [facility] explained [facility] as a  bridge between hospital and home, act as point of access, and offer support for patient and family. Patient agreed to participate and receive weekly engagements.  HIPAA verification completed and notified that call is recorded:  YES,    Provided patient with TCM contact information:  YES  Primary language spoken: [place]  Interpreter needed:  NO    Health History-    Health Status (PMH, current admission summary, previous admission history):  Past Medical History   Diagnosis Date   . Cancer      breast cancer   . Hyperlipidemia    . Hypertensive disorder    . Diabetes mellitus type II    . Malignant neoplasm of breast    . Breast lump    . Asthma without status asthmaticus    . Hemorrhoids without complication      Patient Active Problem List   Diagnosis   . Diabetes type 2, uncontrolled   . Hypertension   . Hyperlipidemia   . H/O total hysterectomy with removal of both tubes and ovaries   . Syncope   . Acute viral syndrome   . Heart block AV first degree       TCM Diagnosis (include A1C, EF, weight): diabetes  Lab Results   Component Value Date    HGBA1C 8.6* 10/11/2013           Does the patient understand why he/she was admitted to the hospital: yes    Medication Reconciliation-    Medication List:  Current Outpatient Prescriptions   Medication Sig Dispense Refill   . albuterol (PROVENTIL) (2.5 MG/3ML) 0.083% nebulizer solution Take 2.5 mg by nebulization every 6 (six) hours as needed.       [name] amLODIPine (NORVASC) 10 MG tablet Take 10 mg by mouth daily.       [name] atorvastatin (LIPITOR) 40 MG tablet Take 40 mg by mouth daily.       . Blood Glucose Monitoring Suppl (ONE TOUCH ULTRA 2) W/DEVICE KIT One daily  1 each  0   . ezetimibe (ZETIA) 10 MG tablet Take 10 mg by  mouth daily.       [name] glipiZIDE (GLUCOTROL) 5 MG tablet        . glucose blood (ONE TOUCH ULTRA TEST) test strip 2 times daily  200 each  3   . JANUVIA 100 MG tablet        . Lancets (ONETOUCH ULTRASOFT) lancets Use to check blood sugars 2 times daily  200 each  3   . nateglinide (STARLIX) 120 MG tablet Take 120 mg by mouth 3 (three) times daily before meals.       . Oxymetazoline HCl (NASAL SPRAY NA) by Nasal route.       . pantoprazole (PROTONIX) 40 MG tablet Take 40 mg by mouth daily.       . valsartan-hydrochlorothiazide (DIOVAN-HCT) 160-12.5 MG per tablet Take 1 tablet by mouth daily.         HC reviewed medication list with patient, Medication Reconciliation complete  Were the medications reviewed with the patient:  YES     Was the patient able to obtain all of his/her medications when they left the hospital:  YES     Does  the patient understand what all of his/her medications are for: YES, YES, Patient verbalized understanding which medications they should be taking and understands how their medications work  Was teaching done on medications:  YES     Plan for resolution of any medication concerns: Medication Reconciliation complete    Physician Follow Up-    Plan for follow up with PCP or endocrinologist within 3-5 days of discharge:  YES, patient stated that she is scheduled to see Dr. Collier Bullock this week, patient stated that she is not scheduled to see cardiologist,  Dr. Rocco Serene until April 20th.    Name/number of PCP: Randolph Bing, MD  [address], [place], [place] [address] [phone]    Diabetes Management-    Does a patient have a glucometer and supplies:  YES, HC spoke to patient.  patient stated that he has glucometer to monitor blood sugars daily     How many times per day was the patient instructed to check his/her blood sugar: Patient stated that she monitor blood sugars three time daily    Has the patient's MD prescribed an acceptable glucose range: YES  HC and patient discussed  having a blood glucose target range  What was blood sugar range since hospital discharge: 130    Is the patient keeping a blood sugar log:  YES  HC and patient discussed keeping a log book to include all blood sugar reading to compare from day to day, noting variations, and possible contributing factors, and to show the doctor on office visits     Was the patient educated on the diabetes red flags/symptoms:  YES       Hypoglycemia-sweating, shaking, dizziness, upset, nervous, consumed, blurry     vision, weak, tired, headache, or hungry YES   Hyperglycemia-increased thirst, frequent urination, weak, tired, blurry vision, dry     skin, or hungry YES    Is the patient currently experiencing any of the diabetes red flags/symptoms:  NO    Was the patient instructed on what to do if he/she is experiencing any of the diabetes red flags/symptoms:  YES, HC and patient reviewed/teach back diabetes, patient verbalizes to take in something sweet if blood sugars are too low, patient verbalizes understanding to drink water, and do brisk exercises if blood sugars are too high.     Has the patient been instructed to always have glucose tablets or fast acting sugar with them in case of low blood sugar:  YES, HC and patient discussed always being prepared especially when leaving the house and the possibility of skipping a meal. HC and patient discussed taking a snack along or having glucose tablets or something sweet readily on hand.    Was [facility] TCM visit initiated:  Scientific laboratory technician mailed to patient:  YES       Diabetes Management-Teachback-    Can the patient identify the signs and symptoms or low blood sugar:  YES, sweating, shaking, dizziness, upset, nervous, consumed, blurry vision, weak, tired, headache, or hungry. Patient was able to do teachback of redflags of diabetes.     Can the patient identify the signs and symptoms of high blood sugar:  YES, increased thirst, frequent urination, weak, tired, blurry  vision, dry skin, or hungry     Can the patient identify what to do if he/she experiences these symptoms: YES.  Patient verbalized understanding that the disease is progressive.  patient acknowledges having and understanding diabetes action plan of  rapid response to blood glucose levels that are out of safe range such as taking in sweet, or glucose tablet when blood sugars are low, or drinking water when blood sugars are high, notifying doctors when symptoms are persistent and not relieved after taking medication and implementing action plan.     Does the patient know to always have glucose tablets or fast acting sugar with them in case of low blood sugar:  YES, patient verbalizes having fast acting sugar on hand for rapid response to low blood sugars.    Gaps in Care/Resource Connection-    Living situation (alone, family, home environment, etc): with family    Gap in Care (insurance status, medication coverage, community connections, transportation, etc): no, patient stated that she has arthritis in her knee;patient stated that her daughter drives her to appointment    Referrals placed for patient: no      Patient acknowledges having adequate family, friends, and social support. patient denies DME, HH or transportation concerns    Elexa Kivi B. [facility] Coach  Transitional Care Management  [facility]  [address], [address]  [place], [place] [address]  T [phone] F [phone]

## 2013-10-23 ENCOUNTER — Other Ambulatory Visit: Payer: Self-pay | Admitting: *Deleted

## 2013-10-23 ENCOUNTER — Ambulatory Visit (INDEPENDENT_AMBULATORY_CARE_PROVIDER_SITE_OTHER): Payer: Medicare PPO | Admitting: Family Medicine

## 2013-10-23 ENCOUNTER — Encounter: Payer: Self-pay | Admitting: Family Medicine

## 2013-10-23 VITALS — BP 128/68 | HR 64 | Temp 97.2°F | Resp 20 | Ht 67.0 in | Wt 191.0 lb

## 2013-10-23 DIAGNOSIS — Z23 Encounter for immunization: Secondary | ICD-10-CM

## 2013-10-23 DIAGNOSIS — E785 Hyperlipidemia, unspecified: Secondary | ICD-10-CM

## 2013-10-23 DIAGNOSIS — Z0000 Encounter for general adult medical examination without abnormal findings: Secondary | ICD-10-CM

## 2013-10-23 DIAGNOSIS — E039 Hypothyroidism, unspecified: Secondary | ICD-10-CM

## 2013-10-23 DIAGNOSIS — I10 Essential (primary) hypertension: Secondary | ICD-10-CM

## 2013-10-23 LAB — CBC WITH DIFFERENTIAL/PLATELET
Basophils Absolute: 0 10*3/uL (ref 0.0–0.1)
Basophils Relative: 0 % (ref 0–1)
Eosinophils Absolute: 0.1 10*3/uL (ref 0.0–0.7)
Eosinophils Relative: 2 % (ref 0–5)
HCT: 40.6 % (ref 36.0–46.0)
Hemoglobin: 14 g/dL (ref 12.0–15.0)
Lymphocytes Relative: 33 % (ref 12–46)
Lymphs Abs: 2 10*3/uL (ref 0.7–4.0)
MCH: 31 pg (ref 26.0–34.0)
MCHC: 34.5 g/dL (ref 30.0–36.0)
MCV: 90 fL (ref 78.0–100.0)
Monocytes Absolute: 0.5 10*3/uL (ref 0.1–1.0)
Monocytes Relative: 9 % (ref 3–12)
Neutro Abs: 3.4 10*3/uL (ref 1.7–7.7)
Neutrophils Relative %: 56 % (ref 43–77)
Platelets: 227 10*3/uL (ref 150–400)
RBC: 4.51 MIL/uL (ref 3.87–5.11)
RDW: 13.5 % (ref 11.5–15.5)
WBC: 6.1 10*3/uL (ref 4.0–10.5)

## 2013-10-23 MED ORDER — OMEPRAZOLE 20 MG PO CPDR: DELAYED_RELEASE_CAPSULE | ORAL | Status: DC

## 2013-10-23 MED ORDER — POTASSIUM CHLORIDE CRYS ER 10 MEQ PO TBCR: EXTENDED_RELEASE_TABLET | ORAL | Status: DC

## 2013-10-23 MED ORDER — LEVOTHYROXINE SODIUM 25 MCG PO TABS: ORAL_TABLET | ORAL | Status: DC

## 2013-10-23 MED ORDER — NEBIVOLOL HCL 5 MG PO TABS: 5.0000 mg | ORAL_TABLET | Freq: Every day | ORAL | Status: DC

## 2013-10-23 MED ORDER — HYDROCHLOROTHIAZIDE 25 MG PO TABS: ORAL_TABLET | ORAL | Status: DC

## 2013-10-23 MED ORDER — ATORVASTATIN CALCIUM 40 MG PO TABS: ORAL_TABLET | ORAL | Status: DC

## 2013-10-23 MED ORDER — CITALOPRAM HYDROBROMIDE 20 MG PO TABS: ORAL_TABLET | ORAL | Status: DC

## 2013-10-23 NOTE — Telephone Encounter (Signed)
Refill appropriate and filled per protocol. 

## 2013-10-23 NOTE — Progress Notes (Signed)
Subjective:    Patient ID: April Beck, female    DOB: [birth year], 68 y.o.   MRN: [id number]  HPI Patient is here today for complete physical exam. She sees Dr. Matthew Saras GYN. He performed her Pap smear, breast exam, mammogram. She has an appointment to see him later this year. Her colonosocpy was performed in 2010 and is up-to-date. She has had Pneumovax 23. She had Zostavax in 2010. She had TdaP in 2010.  She is due for Prevnar 13. Otherwise her immunizations are up to date as well as her cancer screening. Her bone densities are performed her gynecologist. She has a past medical history significant for hypertension, hyperlipidemia, and hypothyroidism. She is on medication for each of his conditions. She is due for fasting lab work. She is also complaining of right knee pain. It hurts to the anterior medial portion of her joint. She received a cortisone injection for this in December. She is interested in possibly receiving a cortisone injection today. It has only been 2 months since her last cortisone injection. The pain is not severe but she does report a constant tenderness over the medial joint line. Apparently x-rays performed at Dr. Alfonso Ramus were "normal".  She denies any locking or laxity in the knee joint. The knee joint is not give way. She was diagnosed with a sprained ligament in December. Past Medical History  Diagnosis Date  . 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 Renal insufficiency 06/19/2012  . Anxiety     hx of panic attack  . Arthritis     hands & knees  . Hypercholesteremia   . Hypertension   . GERD (gastroesophageal reflux disease)   . Hypothyroidism    Past Surgical History  Procedure Laterality Date  . Abdominal hysterectomy    . Lynx bladder sling  09/2005    Dr. Jeffie Pollock  . Cholecystectomy  06/20/2012    Procedure: LAPAROSCOPIC CHOLECYSTECTOMY;  Surgeon: Zenovia Jarred, MD;  Location: [facility];  Service: General;  Laterality: N/A;   Current Outpatient Prescriptions  on File Prior to Visit  Medication Sig Dispense Refill  . acetaminophen (TYLENOL) 325 MG tablet Take 2 tablets (650 mg total) by mouth every 6 (six) hours as needed for pain.      [name] acyclovir (ZOVIRAX) 400 MG tablet Take 1 tablet by mouth daily.      [name] DIVIGEL 0.5 HY/0.7PX GEL Apply 1 application topically daily.      [name] ibuprofen (ADVIL) 200 MG tablet You can take 2-3 every 6 hours as needed for pain.  30 tablet  0  . metroNIDAZOLE (FLAGYL) 500 MG tablet Take 1 tablet (500 mg total) by mouth 2 (two) times daily.  14 tablet  0  . polyethylene glycol (MIRALAX / GLYCOLAX) packet Take 17 g by mouth daily as needed.  14 each     No current facility-administered medications on file prior to visit.   No Known Allergies History   Social History  . Marital Status: Divorced    Spouse Name: N/A    Number of Children: N/A  . Years of Education: N/A   Occupational History  . Not on file.   Social History Main Topics  . Smoking status: Never Smoker   . Smokeless tobacco: Never Used  . Alcohol Use: No  . Drug Use: No  . Sexual Activity:    Other Topics Concern  . Not on file   Social History Narrative  . No narrative on file   No family  history on file.    Review of Systems  All other systems reviewed and are negative.       Objective:   Physical Exam  Vitals reviewed. Constitutional: She is oriented to person, place, and time. She appears well-developed and well-nourished. No distress.  HENT:  Head: Normocephalic and atraumatic.  Right Ear: External ear normal.  Left Ear: External ear normal.  Nose: Nose normal.  Mouth/Throat: Oropharynx is clear and moist. No oropharyngeal exudate.  Eyes: Conjunctivae and EOM are normal. Pupils are equal, round, and reactive to light. Right eye exhibits no discharge. Left eye exhibits no discharge. No scleral icterus.  Neck: Normal range of motion. Neck supple. No JVD present. No tracheal deviation present. No thyromegaly present.    Cardiovascular: Normal rate, regular rhythm, normal heart sounds and intact distal pulses.  Exam reveals no gallop and no friction rub.   No murmur heard. Pulmonary/Chest: Effort normal and breath sounds normal. No stridor. No respiratory distress. She has no wheezes. She has no rales. She exhibits no tenderness.  Abdominal: Soft. Bowel sounds are normal. She exhibits no distension and no mass. There is no tenderness. There is no rebound and no guarding.  Musculoskeletal: She exhibits no edema.       Right knee: She exhibits decreased range of motion and swelling. She exhibits no erythema, no LCL laxity, normal patellar mobility, normal meniscus and no MCL laxity. Tenderness found. Medial joint line tenderness noted. No lateral joint line, no MCL and no LCL tenderness noted.  Lymphadenopathy:    She has no cervical adenopathy.  Neurological: She is alert and oriented to person, place, and time. She has normal reflexes. She displays normal reflexes. No cranial nerve deficit. She exhibits normal muscle tone. Coordination normal.  Skin: Skin is warm. No rash noted. She is not diaphoretic. No erythema. No pallor.  Psychiatric: She has a normal mood and affect. Her behavior is normal. Judgment and thought content normal.          Assessment & Plan:  1. HTN (hypertension) Patient's blood pressure is well controlled. No changes in her current medications. - COMPLETE METABOLIC PANEL WITH GFR - CBC with Differential  2. HLD (hyperlipidemia) Check fasting lipid panel is less than 130 - COMPLETE METABOLIC PANEL WITH GFR - Lipid panel  3. Unspecified hypothyroidism - TSH  4. Routine general medical examination at a health care facility Physical exam is now up to date. The patient received Prevnar 13 today in the office. Her other immunizations and cancer screening are up to date. Followup in one year or as needed.

## 2013-10-23 NOTE — Addendum Note (Signed)
Addended by: Sheral Flow on: 10/23/2013 02:37 PM   Modules accepted: Orders

## 2013-10-24 LAB — COMPLETE METABOLIC PANEL WITH GFR
ALT: 43 U/L — ABNORMAL HIGH (ref 0–35)
AST: 37 U/L (ref 0–37)
Albumin: 4.2 g/dL (ref 3.5–5.2)
Alkaline Phosphatase: 58 U/L (ref 39–117)
BUN: 19 mg/dL (ref 6–23)
CO2: 29 mEq/L (ref 19–32)
Calcium: 9 mg/dL (ref 8.4–10.5)
Chloride: 103 mEq/L (ref 96–112)
Creat: 0.9 mg/dL (ref 0.50–1.10)
GFR, Est African American: 77 mL/min
GFR, Est Non African American: 66 mL/min
Glucose, Bld: 101 mg/dL — ABNORMAL HIGH (ref 70–99)
Potassium: 3.7 mEq/L (ref 3.5–5.3)
Sodium: 140 mEq/L (ref 135–145)
Total Bilirubin: 0.9 mg/dL (ref 0.2–1.2)
Total Protein: 6.6 g/dL (ref 6.0–8.3)

## 2013-10-24 LAB — LIPID PANEL
Cholesterol: 149 mg/dL (ref 0–200)
HDL: 48 mg/dL (ref >39)
LDL Cholesterol: 72 mg/dL (ref 0–99)
Total CHOL/HDL Ratio: 3.1 Ratio
Triglycerides: 146 mg/dL (ref <150)
VLDL: 29 mg/dL (ref 0–40)

## 2013-10-24 LAB — TSH: TSH: 1.903 u[IU]/mL (ref 0.350–4.500)

## 2013-10-28 NOTE — Progress Notes (Signed)
Transitional Care Management     Name/Number of person who participated in call: LEGNA MAUSOLF @ [phone] (M)    What was blood sugar range since hospital discharge: patient stated that morning fasting blood sugar is 129    Physician Follow Up/Medication Follow Up-    Was the patient seen by PCP or endocrinologist within 3-5 days of hospital discharge:  Patient stated that she is currently to see Dr. Collier Bullock today 10/28/13@3pm . Patient stated that daughter is driving her.    Dauntae Derusha B. [facility] Coach  Transitional Care Management  [facility]  [address]  [place], [place] [address]  T [phone] F [phone]

## 2013-11-03 ENCOUNTER — Telehealth: Payer: Self-pay

## 2013-11-03 ENCOUNTER — Other Ambulatory Visit: Payer: Self-pay | Admitting: Cardiovascular Disease

## 2013-11-03 DIAGNOSIS — R9431 Abnormal electrocardiogram [ECG] [EKG]: Secondary | ICD-10-CM

## 2013-11-03 NOTE — Progress Notes (Signed)
Transitional Care Management     Name/Number of person who participated in call: Helen [name] @ [phone] (M)    What was blood sugar range since hospital discharge: 129    HC spoke to patient. Patient stated that she was currently in the car running errands. Patient stated that she has no complaints.    Physician Follow Up/Medication Follow Up-    Was the patient seen by PCP or endocrinologist within 3-5 days of hospital discharge:  YES, patient stated that she saw Dr. Collier Bullock and is scheduled for stress test 11/12/13    Were any medications changed at follow up medical appointment:  NO     Depression Screen-    Over the past 4 weeks, has the patient felt down, depressed or hopeless:  NO    Over the past 4 weeks, has the patients felt little interest or pleasure in doing things:  NO      Wellness-    What is the patient's current/history of alcohol use/abuse: NO    What is the patient's current/history of tobacco use/abuse: NO    Educational materials mailed to patient: YES    How often/much does the patient exercise: daily activity    Has the patient been given exercise guidance from his/her doctor:  NO    Education on exercise provided: YES    Is the patient having any issues with sleeping well:  NO    Has the patient discussed these issues with his/her doctor:  N/A      Diabetes Self management/Health Maintenance-    Does the patient know what a Hemoglobin A1C is:  YES     Has the patient's doctor given instructions on managing diabetes when the patient is sick:  YES, Patient verbalizes having a diabetes action plan as well as diabetes sick day plan    Sick management education completed: YES    Has the patient's doctor recommended a special diet:  NO     Diet education completed: YES, HC and patient discussed healthy meal planning to include limiting sugars intake, monitoring carbohydrates, eating more non-starchy vegetables. Patient verbalized understanding not to skip meals, always have food prepared  in advance, and have glucose tablets or some snack readily available in case of low blood sugar readings    Has the patient been instructed on the following: YES   Influenza vaccine  YES   Pneumococcal vaccine  YES  HC and patient discussed being up to date on all adult immunizations   Health record  YES   Ophthamology exam  YES  Patient verbalized understanding to have eyes checked annually for vision problems to include diabetic retinopathy   Podiatry exam  YES   Foot care  YES, patient verbalized understanding to  check feet for cracks/ sores/ each day and at every doctor's appointment   Dental care  Yes    Helen [name] B. [facility] Coach  Transitional Care Management  [facility]  [address], [address]  [place], [place] [address]  T [phone] F [phone]

## 2013-11-11 ENCOUNTER — Other Ambulatory Visit: Payer: Self-pay | Admitting: Family Medicine

## 2013-11-11 MED ORDER — POTASSIUM CHLORIDE CRYS ER 10 MEQ PO TBCR: EXTENDED_RELEASE_TABLET | ORAL | Status: DC

## 2013-11-11 MED ORDER — ATORVASTATIN CALCIUM 40 MG PO TABS: ORAL_TABLET | ORAL | Status: DC

## 2013-11-11 NOTE — Telephone Encounter (Signed)
Rx Refilled  

## 2013-11-12 ENCOUNTER — Inpatient Hospital Stay
Admission: RE | Admit: 2013-11-12 | Discharge: 2013-11-12 | Disposition: A | Discharge status: Home / Self Care | Payer: Medicare Other | Source: Ambulatory Visit | Attending: Cardiovascular Disease | Admitting: Cardiovascular Disease

## 2013-11-12 DIAGNOSIS — R9431 Abnormal electrocardiogram [ECG] [EKG]: Secondary | ICD-10-CM

## 2013-11-12 DIAGNOSIS — R55 Syncope and collapse: Secondary | ICD-10-CM | POA: Insufficient documentation

## 2013-11-12 MED ORDER — TECHNETIUM TC 99M TETROFOSMIN INJECTION
1.0000 | Freq: Once | Status: AC | PRN
Start: 2013-11-12 — End: 2013-11-12
  Administered 2013-11-12: 1 via INTRAVENOUS

## 2013-12-22 ENCOUNTER — Other Ambulatory Visit: Payer: Self-pay | Admitting: Family Medicine

## 2014-01-02 ENCOUNTER — Other Ambulatory Visit: Payer: Self-pay | Admitting: Family Medicine

## 2014-01-21 ENCOUNTER — Other Ambulatory Visit: Payer: Self-pay | Admitting: Family Medicine

## 2014-01-22 NOTE — Telephone Encounter (Signed)
Refill appropriate and filled per protocol. 

## 2014-02-01 DIAGNOSIS — Z9889 Other specified postprocedural states: Secondary | ICD-10-CM

## 2014-02-01 HISTORY — DX: Other specified postprocedural states: Z98.890

## 2014-03-22 ENCOUNTER — Ambulatory Visit (INDEPENDENT_AMBULATORY_CARE_PROVIDER_SITE_OTHER): Payer: Medicare Other | Admitting: Endocrinology, Diabetes and Metabolism

## 2014-03-22 ENCOUNTER — Encounter: Payer: Self-pay | Admitting: Endocrinology, Diabetes and Metabolism

## 2014-03-22 VITALS — BP 135/62 | HR 85 | Ht 62.0 in | Wt 184.8 lb

## 2014-03-22 DIAGNOSIS — IMO0001 Reserved for inherently not codable concepts without codable children: Secondary | ICD-10-CM

## 2014-03-22 DIAGNOSIS — IMO0002 Reserved for concepts with insufficient information to code with codable children: Secondary | ICD-10-CM

## 2014-03-22 LAB — POCT GLUCOSE: Whole Blood Glucose POCT: 217 mg/dL — AB (ref 70–100)

## 2014-03-22 NOTE — Progress Notes (Signed)
Subjective:     Patient is referred by her PCP, Dr. Cristie Hem, for uncontrolled diabetes. I reviewed the results of her laboratory studies available in EPIC.      Helen [name] is a 68 y.o. female who presents for an initial evaluation of Type 2 diabetes mellitus.  Current symptoms/problems include none.    The patient was initially diagnosed with Type 2 diabetes mellitus based on the following criteria:  Elevated blood glucose. Diabetes was diagnosed over 20 years ago.    Known diabetic complications: none  Cardiovascular risk factors: advanced age (older than 43 for men, 47 for women), diabetes mellitus, dyslipidemia, hypertension and obesity (BMI >= 30 kg/m2)  Current diabetic medications include oral agents (triple therapy): glipizide (generic), nateglinide (Starlix), Januvia.     Eye exam current (within one year): yes  Weight trend: decreasing steadily  Prior visit with dietician: yes - Saw a nutritionist in the past  Current diet: in general, a "healthy" diet  , 3 meals daily. Does not each much of red meat, excpet for bacon. Likes fish. Likes fruits and vegetables.  Current exercise: walking    Current monitoring regimen: home blood tests - 3 times daily. Fasting, after breakfast and at bedtime  Home blood sugar records: fasting range: low to mid 100 and postprandial range: mid to upper 100  Any episodes of hypoglycemia? Very rarely. No severe episodes.    Is She on ACE inhibitor or angiotensin II receptor blocker?   Yes   valsartan + HCTZ (Diovan HCT)    The following portions of the patient's history were reviewed and updated as appropriate: allergies, current medications, past family history, past medical history, past social history, past surgical history and problem list.    Review of Systems  Constitutional: negative  Eyes: negative  Ears, nose, mouth, throat, and face: negative  Respiratory: negative except for wheezing and has asthma that is well controlled by her current meds  Cardiovascular:  negative except for recently diagnosed with bundle branch bloc in the context of a syncope  Gastrointestinal: negative  Genitourinary:negative  Integument/breast: negative  Hematologic/lymphatic: negative  Musculoskeletal:negative except for back pain  Neurological: negative  Behavioral/Psych: negative  Endocrine: negative  Allergic/Immunologic: negative      Objective:      BP 135/62 mmHg  Pulse 85  Ht 1.575 m (5\' 2" )  Wt 83.825 kg (184 lb 12.8 oz)  BMI 33.79 kg/m2  SpO2 96%    General:  alert, appears stated age and cooperative   Oropharynx: lips, mucosa, and tongue normal; teeth and gums normal    Eyes:  conjunctivae/corneas clear. PERRL, EOM's intact. Fundi benign.    Ears:  normal TM's and external ear canals both ears   Neck: no adenopathy, no carotid bruit, no JVD, supple, symmetrical, trachea midline and thyroid not enlarged, symmetric, no tenderness/mass/nodules   Thyroid:  no palpable nodule   Lung: clear to auscultation bilaterally   Heart:  regular rate and rhythm, S1, S2 normal, no murmur, click, rub or gallop   Abdomen: soft, non-tender; bowel sounds normal; no masses,  no organomegaly   Extremities: extremities normal, atraumatic, no cyanosis or edema and presence of calluses at the bottom of both feet. Not tender   Skin: warm and dry, no hyperpigmentation, vitiligo, or suspicious lesions   Pulses: 2+ and symmetric   Neuro: normal without focal findings, mental status, speech normal, alert and oriented x3, PERLA and reflexes normal and symmetric     Lab Review  CO2  Date Value   10/11/2013 24 mEq/L   10/10/2013 27 mEq/L   07/30/2012 27 mmol/L     BUN   Date Value   10/11/2013 11.0 mg/dL   81/19/1478 29.5 mg/dL   62/13/0865 11 MG/DL     3 Hemoglobin H8I values are available in epic. The lowest was 8.2. The highest 9.7     Today's random BG was 217     Assessment:     1.  Long standing type 2 diabetes mellitus, uncontrolled, without known complications.   2.  Hyperlipidemia. On statin  3.  HTN. On  ARB  4.  Asthma  5.  Acid reflux    Plan:     Discussed with patient the importance of achieving and maintaining targeted glycemic control in order to reduce the risk of diabetes related complications such as eye disease, kidney disease, nerve damage, and amputation of the limbs.    Discussed the importance of patient's active involvement in the diabetes care (attending diabetes education classes or individual education sessions, monitoring blood glucose levels before and after meals and keeping a log of self monitored blood glucose, SMBG, taking prescribed medications on time, eating a healthy diabetic diet, and engaging in regular physical activity as tolerated).     Discussed the importance of addressing and managing traditional modifiable cardiac risk factors (lipids, blood pressure, smoking, obesity, sedentary lifestyle) in order to reduce the risk of cardiovascular disease, CVD (heart attacks, heart failure, strokes, and peripheral arterial disease).     Discussed the importance of recognizing, treating, and preventing hypoglycemia.    Discussed the importance of having regular eye exams and foot care.    1.  Rx changes: none  2.  Education: Reviewed 'ABCs' of diabetes management (respective goals in parentheses):  A1C (<7 or at least <8), blood pressure (<140/80), and cholesterol (LDL <100).  3.  Compliance at present is estimated to be inadequate. Efforts to improve compliance (if necessary) will be directed at dietary modifications: Needs to see the CDE to discuss diet. I asked her to reduce the portions of fruits. Will make an appointment for her to see the CDE.  4. Follow up: 3 months    I had a long discussion with the patient focusing on the natural H/O diabetes, the goals for A1C, and the reason why we try to achieve and maintain these goals. We dicussed the fact that many patients with long standing type 2 DM end up reqiring some form of insulin therapy due to the decline in their endogenous insulin  production.  The patient's A1C has been above goal, according to our availabe data, at least since 2013. She is reluctant (and has been before according to other endocrine consultant's notes) to try insulin for a variety of reasons.   She reports that her most recent A1C ordered by Dr. Cristie Hem was above 9. She wants to try to control her DM with lifestyle efforts. I believe she is a stage where other forms of pharmacotherapy for DM need to be considered (insulin or a trial of a GLP-1 receptor agonist, which may actually help with weight). At this point, she does not appear to be ready for either. I suggested we wait then until she has repeat A1c by her PCP and I will see her after that. We will then have another discussion regarding how best to manage her DM.    Enounter visit was 45 minutes. 25 minutes were spent on patient's counseling regarding the medical condition(s) and  plan of care.    Elijio Miles.H.Tamera Punt, MD  [facility]  [address]  [place], [place] [address]  Tel: [phone]  Fax: [phone]    [address]  [place], [place] [address]  Tel: [phone]  Fax: [phone]

## 2014-03-24 ENCOUNTER — Ambulatory Visit: Payer: Medicare Other

## 2014-03-30 ENCOUNTER — Encounter: Payer: Self-pay | Admitting: Diabetes Educator

## 2014-03-30 ENCOUNTER — Ambulatory Visit (INDEPENDENT_AMBULATORY_CARE_PROVIDER_SITE_OTHER): Payer: Medicare Other | Admitting: Diabetes Educator

## 2014-03-30 VITALS — Ht 62.0 in | Wt 184.0 lb

## 2014-03-30 DIAGNOSIS — E119 Type 2 diabetes mellitus without complications: Secondary | ICD-10-CM

## 2014-03-30 NOTE — Progress Notes (Signed)
Reason for Consultation: Initial assessment for diabetes (type 2)    Current Clinical Course: Patient has had diabetes since 20 years ago. Patient does not have complications related to diabetes, including: none. The patient completed diabetes education in the past.    Past Medical/Surgical History: Right bundle block, breast cancer, HTN, asthma    Social History: Patient lives with daughter and 3 grandchildren (7 m.o., 40 and 68 y.o.). Employment: not currently working.     Assessment Findings:     Individuals attending appointment: Patient only    Hemoglobin a1c: "in 9 range" as per patient report, date: n/a.     Patient does not report pain related to diabetes (rating 0/10).      Does the patient or patient companion require special accommodations during the appointment?: No, Other n/a    If yes, which accommodations were provided during this visit?: n/a     Does the patient have difficulty concentrating due to a physical, emotional or mental disability?: No    Does the patient have difficulty performing activities of daily living independently?: No    Does the patient have difficulty dressing or bathing independently?: No    Diabetes Self Management Education (DSME)     Knowledge and Learning Needs Assessment    Ratings:   0 = Refused teaching  1 = Needs instruction  2 = Needs review  3 = Comprehends key points  4 = Demonstrates competency  N/a = Not assessed    Barriers to Learning: Financial Barriers : food avail and price    1. Monitoring: Patient is monitoring blood glucose, 3 times per day, 7 times per week using a One Touch brand meter. Blood glucose levels have been 52% in target since July 15. Assessment of learning: 3.  Since 03/17/14, blood glucose levels have been as follows: fasting 128-185, 2-hour post-breakfast 135-170. Trend is elevated fasting. She does report a BG of 263 in the past few months, she is not sure what she ate beforehand.    2. Medications: Patient is taking medications for diabetes.  Patient is taking glipizide 2x/day, nateglinide 3x/day and januvia 1x/day as prescribed. Patient does not report side effects, including: none.  Assessment of learning: 3.     3. Exercise/Physical Activity: Patient is physically active, 7 days per week, >60 minutes per day.  Assessment of learning: 3. Provided education on risk of hypoglycemia.      4. Healthy Eating: Patient is eating 3 meals and 0-1 snacks per day. Patient is not carbohydrate counting.  Assessment of learning: 2. Provided education on carb versus non-carb foods. She is reading labels. Reports food availability and prices of food can be a barrier to meal planning. Per 24 hour food recall: breakfast at 12pm 1 slice of wheat toast, 1 egg in canola oil, black coffee. Lunch in afternoon hotdog and 1/2 slice bun. Dinner 5-6 pm: serving of fish, 1 cup brown rice, vegetables. Goes to sleep late, around 1 am, has difficulty sleeping. Suggested she try a small bedtime snack to perhaps help with decreasing fasting BG level (ie 1 slice toast with peanut butter). She reports she is trying to decrease her fruit intake. Provided education on carb counting and she is willing to try it. She is interested in losing weight to control her blood glucose levels. Instructed on losing 5-10% of body weight (9-18 lbs) to see whether this helps decrease her blood glucose levels, as she is motivated to lose weight and not willing to try injections  at this time. Provided education on Internet and phone resources for carb counting.    5. Healthy Coping: In the past 2 weeks, the patient does not report loss of interest in activities. In the past 2 weeks, the patient does not report hopelessness, depression, sadness.  Assessment of learning: 2. She reports she has a good support system at home and through faith, church.     6. Problem Solving: Patient does not demonstrate pattern management skills.  Assessment of learning: 1.     7. Risk Reduction: Patient has had a dilated eye  exam in the past year (Date: n/a). Patient has had a comprehensive foot exam in the past year (Date: July 2015). Patient is up to date on vaccinations. Patient does not wear medical identification bracelet. Patient verbalizes understanding of hypoglycemia protocol:  Yes. Patient verbalizes understanding of hyperglycemia and sick day guidelines:  No.  Assessment of learning: 2.     Diabetes Self Management Support (DSMS) Plan     Plan of Care: Patient will return for an individual follow-up appointment with RN or RD CDE if needed. Contact info provided for additional questions and needs.    Recommendations to Provider: Lab testing (a1c)    Patient verbalizes understanding and agreement with above information and plan of care:  Yes

## 2014-05-13 ENCOUNTER — Encounter: Payer: Self-pay | Admitting: Family Medicine

## 2014-05-13 ENCOUNTER — Ambulatory Visit (INDEPENDENT_AMBULATORY_CARE_PROVIDER_SITE_OTHER): Payer: Medicare PPO | Admitting: Family Medicine

## 2014-05-13 VITALS — BP 116/68 | HR 62 | Temp 97.8°F | Resp 16 | Ht 67.0 in | Wt 182.0 lb

## 2014-05-13 DIAGNOSIS — R05 Cough: Secondary | ICD-10-CM

## 2014-05-13 DIAGNOSIS — Z23 Encounter for immunization: Secondary | ICD-10-CM

## 2014-05-13 DIAGNOSIS — E039 Hypothyroidism, unspecified: Secondary | ICD-10-CM

## 2014-05-13 DIAGNOSIS — B372 Candidiasis of skin and nail: Secondary | ICD-10-CM

## 2014-05-13 DIAGNOSIS — R053 Chronic cough: Secondary | ICD-10-CM

## 2014-05-13 DIAGNOSIS — I10 Essential (primary) hypertension: Secondary | ICD-10-CM

## 2014-05-13 DIAGNOSIS — R059 Cough, unspecified: Secondary | ICD-10-CM

## 2014-05-13 LAB — CBC WITH DIFFERENTIAL/PLATELET
Basophils Absolute: 0 10*3/uL (ref 0.0–0.1)
Basophils Relative: 0 % (ref 0–1)
Eosinophils Absolute: 0.1 10*3/uL (ref 0.0–0.7)
Eosinophils Relative: 2 % (ref 0–5)
HCT: 43.8 % (ref 36.0–46.0)
Hemoglobin: 15.1 g/dL — ABNORMAL HIGH (ref 12.0–15.0)
Lymphocytes Relative: 39 % (ref 12–46)
Lymphs Abs: 2.7 10*3/uL (ref 0.7–4.0)
MCH: 31.3 pg (ref 26.0–34.0)
MCHC: 34.5 g/dL (ref 30.0–36.0)
MCV: 90.7 fL (ref 78.0–100.0)
Monocytes Absolute: 0.6 10*3/uL (ref 0.1–1.0)
Monocytes Relative: 9 % (ref 3–12)
Neutro Abs: 3.5 10*3/uL (ref 1.7–7.7)
Neutrophils Relative %: 50 % (ref 43–77)
Platelets: 217 10*3/uL (ref 150–400)
RBC: 4.83 MIL/uL (ref 3.87–5.11)
RDW: 12.5 % (ref 11.5–15.5)
WBC: 7 10*3/uL (ref 4.0–10.5)

## 2014-05-13 LAB — COMPLETE METABOLIC PANEL WITH GFR
ALT: 29 U/L (ref 0–35)
AST: 28 U/L (ref 0–37)
Albumin: 4.2 g/dL (ref 3.5–5.2)
Alkaline Phosphatase: 71 U/L (ref 39–117)
BUN: 17 mg/dL (ref 6–23)
CO2: 27 mEq/L (ref 19–32)
Calcium: 9.2 mg/dL (ref 8.4–10.5)
Chloride: 98 mEq/L (ref 96–112)
Creat: 1.06 mg/dL (ref 0.50–1.10)
GFR, Est African American: 63 mL/min
GFR, Est Non African American: 54 mL/min — ABNORMAL LOW
Glucose, Bld: 101 mg/dL — ABNORMAL HIGH (ref 70–99)
Potassium: 3.4 mEq/L — ABNORMAL LOW (ref 3.5–5.3)
Sodium: 140 mEq/L (ref 135–145)
Total Bilirubin: 0.8 mg/dL (ref 0.2–1.2)
Total Protein: 7 g/dL (ref 6.0–8.3)

## 2014-05-13 LAB — LIPID PANEL
Cholesterol: 132 mg/dL (ref 0–200)
HDL: 43 mg/dL (ref >39)
LDL Cholesterol: 60 mg/dL (ref 0–99)
Total CHOL/HDL Ratio: 3.1 Ratio
Triglycerides: 143 mg/dL (ref <150)
VLDL: 29 mg/dL (ref 0–40)

## 2014-05-13 LAB — TSH: TSH: 4.567 u[IU]/mL — ABNORMAL HIGH (ref 0.350–4.500)

## 2014-05-13 MED ORDER — PANTOPRAZOLE SODIUM 40 MG PO TBEC: 40.0000 mg | DELAYED_RELEASE_TABLET | Freq: Two times a day (BID) | ORAL | Status: DC

## 2014-05-13 MED ORDER — CLOTRIMAZOLE-BETAMETHASONE 1-0.05 % EX CREA: 1.0000 "application " | TOPICAL_CREAM | Freq: Two times a day (BID) | CUTANEOUS | Status: DC

## 2014-05-13 NOTE — Progress Notes (Signed)
Subjective:    Patient ID: April Beck, female    DOB: 06/16/1946, 68 y.o.   MRN: [id number]  HPI  Patient history of hypertension and hyperlipidemia along with hypothyroidism. She is overdue for fasting lab work including CMP, CBC, TSH, and fasting lipid panel. She also has a rash at the superior aspect of her gluteal cleft. It has been there approximately 2 weeks. It is itchy and sore. On examination it appears to be intertrigo. His erythematous skin on both sides of the gluteal cleft with satellite erythematous macules and fine white scale. There is no evidence of pus or a secondary bacterial infection.  Patient also reports a chronic cough for the last several months. She consequently she is mucus and drainage in her upper airway and allow her voicebox. She finds she is constantly clearing her throat. Cough is usually triggered after she eats a meal. She doesn't history of esophageal reflux he takes omeprazole 40 mg by mouth daily. Larkin Ina is a history of dysphasia secondary to esophageal reflux. She denies postnasal drip, sinusitis, or rhinitis. She denies hemoptysis or fever or weight loss. Past Medical History  Diagnosis Date  . 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 Renal insufficiency 06/19/2012  . Anxiety     hx of panic attack  . Arthritis     hands & knees  . Hypercholesteremia   . Hypertension   . GERD (gastroesophageal reflux disease)   . Hypothyroidism    Past Surgical History  Procedure Laterality Date  . Abdominal hysterectomy    . Lynx bladder sling  09/2005    Dr. Jeffie Pollock  . Cholecystectomy  06/20/2012    Procedure: LAPAROSCOPIC CHOLECYSTECTOMY;  Surgeon: Zenovia Jarred, MD;  Location: [place] [facility];  Service: General;  Laterality: N/A;   Current Outpatient Prescriptions on File Prior to Visit  Medication Sig Dispense Refill  . acetaminophen (TYLENOL) 325 MG tablet Take 2 tablets (650 mg total) by mouth every 6 (six) hours as needed for pain.      [name] acyclovir (ZOVIRAX)  400 MG tablet Take 1 tablet by mouth daily.      [name] atorvastatin (LIPITOR) 40 MG tablet TAKE 1 TABLET BY MOUTH EVERY DAY  90 tablet  3  . BYSTOLIC 5 MG tablet TAKE 1 TABLET BY MOUTH DAILY  90 tablet  3  . citalopram (CELEXA) 20 MG tablet TAKE 1 TABLET EVERY DAY  90 tablet  1  . DIVIGEL 0.5 FB/5.1WC GEL Apply 1 application topically daily.      . hydrochlorothiazide (HYDRODIURIL) 25 MG tablet TAKE 1 TABLET EVERY DAY  90 tablet  1  . ibuprofen (ADVIL) 200 MG tablet You can take 2-3 every 6 hours as needed for pain.  30 tablet  0  . levothyroxine (SYNTHROID, LEVOTHROID) 25 MCG tablet TAKE 1 TABLET EVERY DAY  90 tablet  1  . metroNIDAZOLE (FLAGYL) 500 MG tablet Take 1 tablet (500 mg total) by mouth 2 (two) times daily.  14 tablet  0  . omeprazole (PRILOSEC) 20 MG capsule TAKE 2 CAPSULES EVERY DAY  180 capsule  1  . polyethylene glycol (MIRALAX / GLYCOLAX) packet Take 17 g by mouth daily as needed.  14 each    . potassium chloride (K-DUR,KLOR-CON) 10 MEQ tablet TAKE 1 TABLET TWICE DAILY  180 tablet  1   No current facility-administered medications on file prior to visit.   No Known Allergies History   Social History  . Marital Status: Divorced    Spouse Name:  N/A    Number of Children: N/A  . Years of Education: N/A   Occupational History  . Not on file.   Social History Main Topics  . Smoking status: Never Smoker   . Smokeless tobacco: Never Used  . Alcohol Use: No  . Drug Use: No  . Sexual Activity:    Other Topics Concern  . Not on file   Social History Narrative  . No narrative on file   No family history on file.   Review of Systems  All other systems reviewed and are negative.      Objective:   Physical Exam  Vitals reviewed. Constitutional: She appears well-developed and well-nourished.  HENT:  Nose: Nose normal.  Mouth/Throat: Oropharynx is clear and moist. No oropharyngeal exudate.  Neck: Neck supple. No JVD present. No thyromegaly present.  Cardiovascular:  Normal rate, regular rhythm, normal heart sounds and intact distal pulses.   No murmur heard. Pulmonary/Chest: Effort normal and breath sounds normal. No respiratory distress. She has no wheezes. She has no rales. She exhibits no tenderness.  Abdominal: Soft. Bowel sounds are normal. She exhibits no distension. There is no tenderness. There is no rebound and no guarding.  Lymphadenopathy:    She has no cervical adenopathy.   Patient has a rash in the superior gluteal cleft as described in history present illness.       Assessment & Plan:  Essential hypertension - Plan: COMPLETE METABOLIC PANEL WITH GFR, CBC with Differential, Lipid panel  Unspecified hypothyroidism - Plan: TSH  Chronic cough - Plan: pantoprazole (PROTONIX) 40 MG tablet  Candidal intertrigo - Plan: clotrimazole-betamethasone (LOTRISONE) cream  Need for prophylactic vaccination and inoculation against influenza - Plan: Flu Vaccine QUAD 36+ mos IM  Patient's blood pressures well controlled today. I will check a CMP as well as a fasting lipid panel. LDL cholesterol is less than 130. Patient also check a TSH to make sure that her levothyroxine is appropriate dose. I want to check a fasting lipid panel to make sure that her Lipitor is maintaining a goal LDL cholesterol.  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 I switched the patient to protonic 40 mg by mouth twice a day.  Recheck in one month. If cough persists and she chest x-ray. The facet the patient has intergluteal cleft appears to be tented intertrigo. I recommended a present cream be applied twice a day for 2 weeks. Recheck in 2 weeks if the rash persists. Also vaccinate the patient against influenza today.

## 2014-05-14 MED ORDER — LEVOTHYROXINE SODIUM 50 MCG PO TABS: ORAL_TABLET | ORAL | Status: DC

## 2014-05-14 NOTE — Addendum Note (Signed)
Addended by: Kyra Manges on: 05/14/2014 09:58 AM   Modules accepted: Orders

## 2014-05-17 ENCOUNTER — Telehealth: Payer: Self-pay | Admitting: Family Medicine

## 2014-05-17 NOTE — Telephone Encounter (Signed)
Patient is calling you regarding her medications  [phone]

## 2014-05-18 ENCOUNTER — Encounter: Payer: Self-pay | Admitting: Endocrinology, Diabetes and Metabolism

## 2014-05-20 ENCOUNTER — Other Ambulatory Visit: Payer: Self-pay | Admitting: *Deleted

## 2014-05-20 MED ORDER — LEVOTHYROXINE SODIUM 50 MCG PO TABS: ORAL_TABLET | ORAL | Status: DC

## 2014-05-20 NOTE — Telephone Encounter (Signed)
Pt wanted levothryoxine sent to mail order, script sent oiut

## 2014-05-23 ENCOUNTER — Emergency Department: Payer: Medicare Other

## 2014-05-23 ENCOUNTER — Emergency Department
Admission: EM | Admit: 2014-05-23 | Discharge: 2014-05-23 | Disposition: A | Discharge status: Home / Self Care | Payer: Medicare Other | Attending: Emergency Medicine | Admitting: Emergency Medicine

## 2014-05-23 DIAGNOSIS — E119 Type 2 diabetes mellitus without complications: Secondary | ICD-10-CM | POA: Insufficient documentation

## 2014-05-23 DIAGNOSIS — E785 Hyperlipidemia, unspecified: Secondary | ICD-10-CM | POA: Insufficient documentation

## 2014-05-23 DIAGNOSIS — M25569 Pain in unspecified knee: Secondary | ICD-10-CM | POA: Insufficient documentation

## 2014-05-23 DIAGNOSIS — I10 Essential (primary) hypertension: Secondary | ICD-10-CM | POA: Insufficient documentation

## 2014-05-23 DIAGNOSIS — Z853 Personal history of malignant neoplasm of breast: Secondary | ICD-10-CM | POA: Insufficient documentation

## 2014-05-23 DIAGNOSIS — M25562 Pain in left knee: Secondary | ICD-10-CM

## 2014-05-23 DIAGNOSIS — W010XXA Fall on same level from slipping, tripping and stumbling without subsequent striking against object, initial encounter: Secondary | ICD-10-CM | POA: Insufficient documentation

## 2014-05-23 DIAGNOSIS — J45909 Unspecified asthma, uncomplicated: Secondary | ICD-10-CM | POA: Insufficient documentation

## 2014-05-23 DIAGNOSIS — W19XXXA Unspecified fall, initial encounter: Secondary | ICD-10-CM

## 2014-05-23 HISTORY — DX: Unspecified right bundle-branch block: I45.10

## 2014-05-23 MED ORDER — ACETAMINOPHEN 500 MG PO TABS
1000.0000 mg | ORAL_TABLET | Freq: Once | ORAL | Status: AC
Start: 2014-05-23 — End: 2014-05-23
  Administered 2014-05-23: 1000 mg via ORAL
  Filled 2014-05-23: qty 2

## 2014-05-23 NOTE — ED Provider Notes (Signed)
EMERGENCY DEPARTMENT HISTORY AND PHYSICAL EXAM     Physician/Midlevel provider first contact with patient: 05/23/14 1531         Date: 05/23/2014  Patient Name: Helen [name]    History of Presenting Illness     Chief Complaint   Patient presents with   . Leg Injury       History Provided By: Patient    Chief Complaint: Knee Pain  Onset: Today  Timing: Suddenly  Location: Below knee, left  Quality: Cannot bear weight  Severity: Moderate  Modifying Factors: None  Associated Symptoms: No head trauma. No pain anywhere else.     Additional History: Helen [name] is a 68 y.o. female pt c/o left knee pain x this afternoon; pt tripped over a "doggy gate" and landed on her left knee. She states she does not take any medicine; she denies pain medicine and requests Tylenol. Pt denies head trauma. No numbness, tingling, back pain, hip pain, lightheadedness. No LOC, ETOH, or illicit drugs.      PCP: Dolan Amen, MD    No current facility-administered medications for this encounter.     Current Outpatient Prescriptions   Medication Sig Dispense Refill   . albuterol (PROVENTIL) (2.5 MG/3ML) 0.083% nebulizer solution Take 2.5 mg by nebulization every 6 (six) hours as needed.     [name] amLODIPine (NORVASC) 10 MG tablet Take 10 mg by mouth daily.     [name] atorvastatin (LIPITOR) 40 MG tablet Take 40 mg by mouth daily.     . Blood Glucose Monitoring Suppl (ONE TOUCH ULTRA 2) W/DEVICE KIT One daily 1 each 0   . glipiZIDE (GLUCOTROL) 5 MG tablet      . glucose blood (ONE TOUCH ULTRA TEST) test strip 2 times daily 200 each 3   . Lancets (ONETOUCH ULTRASOFT) lancets Use to check blood sugars 2 times daily 200 each 3   . montelukast (SINGULAIR) 10 MG tablet      . NASONEX 50 MCG/ACT nasal spray   5   . nateglinide (STARLIX) 120 MG tablet Take 120 mg by mouth 3 (three) times daily before meals.     . pantoprazole (PROTONIX) 40 MG tablet Take 40 mg by mouth daily.     [name] PATADAY 0.2 % Solution   11   . SitaGLIPtin Phosphate (JANUVIA  PO) Take by mouth.     . valsartan-hydrochlorothiazide (DIOVAN-HCT) 160-12.5 MG per tablet Take 1 tablet by mouth daily.         Past History     Past Medical History:  Past Medical History   Diagnosis Date   . Cancer      breast cancer   . Hyperlipidemia    . Hypertensive disorder    . Diabetes mellitus type II    . Malignant neoplasm of breast    . Breast lump    . Asthma without status asthmaticus    . Hemorrhoids without complication    . RBBB        Past Surgical History:  Past Surgical History   Procedure Laterality Date   . Breast biopsy     . Hysterectomy       total       Family History:  Family History   Problem Relation Age of Onset   . Diabetes Mother    . Diabetes Sister        Social History:  History   Substance Use Topics   . Smoking status: Never Smoker    .  Smokeless tobacco: Never Used   . Alcohol Use: No       Allergies:  Allergies   Allergen Reactions   . Aspirin      Breat out in sweat and dizziness        Review of Systems     Review of Systems   Constitutional: Negative for malaise/fatigue.   HENT: Negative for ear discharge.    Eyes: Negative for blurred vision and double vision.   Respiratory: Negative for shortness of breath.    Cardiovascular: Negative for chest pain and leg swelling.   Gastrointestinal: Negative for nausea and abdominal pain.   Genitourinary: Negative for flank pain.   Musculoskeletal: Positive for joint pain and falls. Negative for myalgias, back pain and neck pain.        (-) Head trauma  (+) Knee Pain, left   Neurological: Negative for tingling, sensory change, weakness and headaches.   Endo/Heme/Allergies: Does not bruise/bleed easily.   Psychiatric/Behavioral: Negative for suicidal ideas and substance abuse.         Physical Exam   BP 143/67 mmHg  Pulse 73  Temp(Src) 97.6 F (36.4 C) (Oral)  Resp 18  Ht 1.575 m  Wt 81.194 kg  BMI 32.73 kg/m2  SpO2 99%    Constitutional: Vital signs reviewed. Well appearing. No distress.  Head: Normocephalic,  atraumatic  Eyes: Conjunctiva and sclera are normal.  No injection or discharge.  Ears, Nose, Throat:  Normal external examination of the nose and ears.  Mucous membranes moist.  Neck: Normal range of motion. No midline C-spine tenderness. Trachea midline.  Respiratory/Chest: Clear to auscultation. No respiratory distress.   Cardiovascular: Regular rate and rhythm. No murmurs.  Abdomen:  Bowel sounds intact. No rebound or guarding. Soft.  Non-tender.  Back: No midline CTLS spine tenderness to palpation. Stable pelvis. No hip tenderness.   Upper Extremity:  No edema. No cyanosis. Bilateral radial pulses intact and equal. No focal tenderness.   Lower Extremity: (+) tender to palpation at tibial plateau of the L knee. ROM not tested due to pain with minimal flexion of the L knee. No edema. No cyanosis. Bilateral calves symmetrical and non-tender. DP and PT pulses intact and equal in the bilateral lower extremities. No tenderness elsewhere.  Skin: Warm and dry. No rash.  Neuro: A&Ox3. CNII-XII intact to testing. Moves all extremities spontaneously. Normal gait.   Psychiatric:  Normal affect.  Normal insight.      Diagnostic Study Results     Labs -     Results     ** No results found for the last 24 hours. **          Radiologic Studies -   Radiology Results (24 Hour)     Procedure Component Value Units Date/Time    CT Knee Left without Contrast [[id number]] Collected:  05/23/14 1755    Order Status:  Completed Updated:  05/23/14 1803    Narrative:      INDICATION:  Unable to ambulate status post trauma left knee. Possible  subtle tibial plateau fracture.    TECHNIQUE:  Spiral imaging was performed throughout the left knee in the  axial plane with coronal and sagittal reconstruction.    COMPARISON:None    FINDINGS:  There is identified to be tricompartmental degenerative joint  disease with narrowing and osteophytic spurring. There is no evidence  for cortical fracture or findings that would suggest a subtle  tibial  plateau fracture. No other definite acute abnormality was evident.  Impression:       Osteoarthritis. No evidence for acute fracture.     Lorenda Peck, MD   05/23/2014 5:59 PM      Knee 4+ Views Left [[id number]] Collected:  05/23/14 1609    Order Status:  Completed Updated:  05/23/14 1614    Narrative:      INDICATION:  Trauma attention to be a plateau left knee pain and  swelling    TECHNIQUE:  4 views    COMPARISON:None    FINDINGS:  There is moderate degenerative joint disease of the medial  knee joint compartment and patellofemoral joint compartment. There were  no definite findings for acute bony, soft tissue or joint space  abnormality.      Impression:       Osteoarthritis.    Lorenda Peck, MD   05/23/2014 4:10 PM        .      Medical Decision Making   I am the first provider for this patient.    I reviewed the vital signs, available nursing notes, past medical history, past surgical history, family history and social history.    Vital Signs: Reviewed the patient's vital signs.     No data found.      Pulse Oximetry Analysis: Normal 97% on RA    Old Medical Records: Nursing notes.   Old medical records.    ED Course:   4:24 PM -  Updated pt on imaging results (unremarkable); pt agrees with getting CT scan.    6:00 PM - Reviewed CT results with pt and friends. She agrees with plan and f/u.    Provider Notes:   Pt with fall onto L knee prior to arrival. No acute fracture on imaging but unable to bear weight. Given knee immobilizer and crutches. Will f/u closely with ortho (referral provided). Reviewed supportive care.  Reviewed red flags for return to ED and pt voiced understanding.      Diagnosis     Clinical Impression:   1. Left knee pain    2. Fall, initial encounter        Treatment Plan:   ED Disposition     Discharge Helen [name] discharge to home/self care.    Condition at disposition: Stable          _______________________________    Attestations: This note is prepared by Augustin Schooling acting as scribe for Lynnea Ferrier, MD.    Lynnea Ferrier, MD - The scribe's documentation has been prepared under my direction and personally reviewed by me in its entirety.  I confirm that the note above accurately reflects all work, treatment, procedures, and medical decision making performed by me.      _______________________________               Maryella Shivers, MD  05/24/14 [phone]

## 2014-05-23 NOTE — ED Notes (Signed)
Tripped and fell at home, injured lt lower leg, pain below the knee.

## 2014-05-23 NOTE — Discharge Instructions (Signed)
Knee Pain NOS     You have been seen for knee pain.     There are a few causes for knee pain. The doctor feels your knee pain is not from an injury to your knee's bones or ligaments.      Injury to the ligaments or bones is not the only cause of knee pain. There are other causes. These include:  · Tendonitis. This is the inflammation (swelling) of the tendons. Tendons are the thick cords that connect the muscles around the knee to the bones of the knee joint.  · Bursitis. This is the inflammation (swelling) of the fluid-filled sacs that cushion the knee joint.  · Arthritis (inflammation of joints).  · Gout (swelling of the joints).  · Knee injuries from overuse.     Some things you can do to treat your knee pain are:  · Apply ice to the knee with an ice pack. Be sure to put a towel between the ice pack and your skin. NEVER PLACE DIRECTLY ON YOUR SKIN. You can do this for 15 minutes at a time, several times a day.  · Use anti-inflammatory medicine like ibuprofen (Advil® or Motrin®) to help the pain and swelling.  · Avoid doing things that put a lot of stress on your knee joints. This includes running or playing tennis.     YOU SHOULD SEEK MEDICAL ATTENTION IMMEDIATELY, EITHER HERE OR AT THE NEAREST EMERGENCY DEPARTMENT, IF ANY OF THE FOLLOWING OCCUR:  · Your knee pain gets worse.  · You have fever (temperature higher than 100.4ºF / 38ºC) or chills or your knee gets more red or warm.  · You have any other problems or concerns.

## 2014-05-23 NOTE — ED Notes (Signed)
Diagnostics are completed. Pt placed in knee immobilizer. Crutch training completed. Pt. Able to give suitable return demo on crutches.  Discharged ambulatory accompanied by family and friends.

## 2014-06-02 ENCOUNTER — Ambulatory Visit (INDEPENDENT_AMBULATORY_CARE_PROVIDER_SITE_OTHER): Payer: Medicare Other | Admitting: Orthopaedic Surgery

## 2014-06-02 ENCOUNTER — Encounter (INDEPENDENT_AMBULATORY_CARE_PROVIDER_SITE_OTHER): Payer: Self-pay | Admitting: Orthopaedic Surgery

## 2014-06-02 VITALS — Ht 62.0 in | Wt 179.0 lb

## 2014-06-02 DIAGNOSIS — S83412A Sprain of medial collateral ligament of left knee, initial encounter: Secondary | ICD-10-CM

## 2014-06-02 DIAGNOSIS — S83419A Sprain of medial collateral ligament of unspecified knee, initial encounter: Secondary | ICD-10-CM

## 2014-06-02 MED ORDER — IBUPROFEN 800 MG PO TABS
800.0000 mg | ORAL_TABLET | Freq: Four times a day (QID) | ORAL | Status: AC | PRN
Start: 2014-06-02 — End: 2014-06-12

## 2014-06-04 NOTE — H&P (Signed)
CHIEF COMPLAINT:  Left knee pain.     HISTORY OF PRESENT ILLNESS:  A pleasant 68 year old female, who had a twisting injury of her knee, has  been having marked pain since that time.  She presented to urgent care,  where x-rays and CT scan were obtained.  Most of her pain is medial.  She  does not really feel that the knee is giving way on her at all.  She has  not had pain like this before.  She has taken some narcotic medicine for  this.     PAST MEDICAL HISTORY:  Cancer, hyperlipidemia, hypertension, breast cancer, asthma, hemorrhoids.     PAST SURGICAL HISTORY:  Breast biopsy, hysterectomy.     FAMILY HISTORY:  Diabetes.     SOCIAL HISTORY:,  Does not smoke, does not drink alcohol.     ALLERGIES:  No known documented allergies.  Says sometimes ASPIRIN causes her to break  out in sweats.     MEDICATIONS:  Amlodipine, Nasonex, Pataday, Tradjenta, atorvastatin, glipizide, Motrin,  Singulair, Starlix, Protonix, Diovan HCT.     REVIEW OF SYSTEMS:  No fevers, no chills, no chest pain, no shortness of breath.  The remainder  of the review of systems is fully negative for all 10 systems.     PHYSICAL EXAMINATION:  GENERAL:  Alert and oriented female in no acute distress.  VITAL SIGNS:  Afebrile.  Vital signs stable.  EXTREMITIES:  Bilateral lower extremities reveal she is sensory and  vascularly intact.  She has 5/5 strength in all major muscle groups.  The  left knee shows no significant effusion.  Tenderness to palpation medially.   Negative McMurry.  Some pain with valgus stress, but no instability.   Stable ligamentous examination otherwise.  No difficulty with straight-leg  raise.  Tests are positive for patellofemoral grind.     IMAGING:  X-rays were unremarkable, except for some osteoarthritis, as with CT scan.     IMPRESSION AND PLAN:  A 68 year old female.  Possibly she may have a medial meniscus tear or  medial collateral ligament sprain.  I am going to have her obtain an  magnetic resonance imaging to better  evaluate this.  Will keep her in a  brace in the meantime, and she will follow up with me with magnetic  resonance imaging in hand.

## 2014-06-09 ENCOUNTER — Ambulatory Visit
Admission: RE | Admit: 2014-06-09 | Discharge: 2014-06-09 | Disposition: A | Discharge status: Home / Self Care | Payer: Medicare Other | Source: Ambulatory Visit | Attending: Orthopaedic Surgery | Admitting: Orthopaedic Surgery

## 2014-06-09 DIAGNOSIS — S83412A Sprain of medial collateral ligament of left knee, initial encounter: Secondary | ICD-10-CM | POA: Insufficient documentation

## 2014-06-09 DIAGNOSIS — X58XXXA Exposure to other specified factors, initial encounter: Secondary | ICD-10-CM | POA: Insufficient documentation

## 2014-06-09 DIAGNOSIS — S83242A Other tear of medial meniscus, current injury, left knee, initial encounter: Secondary | ICD-10-CM | POA: Insufficient documentation

## 2014-06-09 DIAGNOSIS — M179 Osteoarthritis of knee, unspecified: Secondary | ICD-10-CM | POA: Insufficient documentation

## 2014-06-09 DIAGNOSIS — S83282A Other tear of lateral meniscus, current injury, left knee, initial encounter: Secondary | ICD-10-CM | POA: Insufficient documentation

## 2014-06-11 ENCOUNTER — Ambulatory Visit (INDEPENDENT_AMBULATORY_CARE_PROVIDER_SITE_OTHER): Payer: Medicare Other | Admitting: Orthopaedic Surgery

## 2014-06-11 ENCOUNTER — Encounter (INDEPENDENT_AMBULATORY_CARE_PROVIDER_SITE_OTHER): Payer: Self-pay | Admitting: Orthopaedic Surgery

## 2014-06-11 VITALS — BP 135/89 | HR 66 | Ht 62.0 in | Wt 179.0 lb

## 2014-06-11 DIAGNOSIS — S83249D Other tear of medial meniscus, current injury, unspecified knee, subsequent encounter: Secondary | ICD-10-CM

## 2014-06-11 DIAGNOSIS — S83522A Sprain of posterior cruciate ligament of left knee, initial encounter: Secondary | ICD-10-CM

## 2014-06-11 MED ORDER — LIDOCAINE HCL 1 % IJ SOLN
8.0000 mL | Freq: Once | INTRAMUSCULAR | Status: DC
Start: 2014-06-11 — End: 2015-04-21

## 2014-06-11 MED ORDER — TRIAMCINOLONE ACETONIDE 40 MG/ML IJ SUSP
80.0000 mg | Freq: Once | INTRAMUSCULAR | Status: DC
Start: 2014-06-11 — End: 2021-05-16

## 2014-06-11 NOTE — Progress Notes (Signed)
CHIEF COMPLAINT:  Followup for the knee.     HISTORY OF PRESENT ILLNESS:  She comes back.  The knee is getting a little bit better.  The brace we  gave her is helping.  It still bothers her though.  She does not feel that  this has bothered her before like this even though she has had a history of  arthritis.       PHYSICAL EXAMINATION:  Knee shows less effusion than last time.  Tenderness to palpation over the  medial joint line and some laterally.  Positive patellofemoral grind.   Ligaments exam did reveal some posterior instability, but was relatively  mild; otherwise unremarkable.     IMAGING:  MRI shows meniscus tear, degenerative joint disease, also there is a  posterior cruciate ligament tear.  I am unable to tell if this is acute or  chronic.     IMPRESSION AND PLAN:  A 68 year old female with posterior cruciate ligament tear.  We had a  conversation about this  I really think more of her symptoms are coming  from the medial meniscus than arthritis.  We are going to give her a  cortisone injection and continue anti-inflammatory medication, bracing and  have her begin physical therapy.  She will follow up with me again in 6  weeks.     PROCEDURE:  Left knee is injected 2 mL of Kenalog and 8 mL of lidocaine.  The patient  tolerated this well.

## 2014-06-18 ENCOUNTER — Other Ambulatory Visit: Payer: Self-pay

## 2014-06-22 ENCOUNTER — Ambulatory Visit: Payer: Medicare Other | Admitting: Endocrinology, Diabetes and Metabolism

## 2014-06-24 ENCOUNTER — Ambulatory Visit: Payer: Medicare PPO | Admitting: Family Medicine

## 2014-06-29 ENCOUNTER — Encounter: Payer: Self-pay | Admitting: Family Medicine

## 2014-06-29 ENCOUNTER — Ambulatory Visit (INDEPENDENT_AMBULATORY_CARE_PROVIDER_SITE_OTHER): Payer: Commercial Managed Care - HMO | Admitting: Family Medicine

## 2014-06-29 VITALS — BP 128/70 | HR 78 | Temp 98.6°F | Resp 16 | Ht 67.5 in | Wt 183.0 lb

## 2014-06-29 DIAGNOSIS — E038 Other specified hypothyroidism: Secondary | ICD-10-CM

## 2014-06-29 DIAGNOSIS — E785 Hyperlipidemia, unspecified: Secondary | ICD-10-CM

## 2014-06-29 DIAGNOSIS — N9489 Other specified conditions associated with female genital organs and menstrual cycle: Secondary | ICD-10-CM

## 2014-06-29 DIAGNOSIS — N898 Other specified noninflammatory disorders of vagina: Secondary | ICD-10-CM

## 2014-06-29 LAB — WET PREP FOR TRICH, YEAST, CLUE: Trich, Wet Prep: NONE SEEN

## 2014-06-29 MED ORDER — METRONIDAZOLE 500 MG PO TABS: 500.0000 mg | ORAL_TABLET | Freq: Two times a day (BID) | ORAL | Status: DC

## 2014-06-29 MED ORDER — FLUCONAZOLE 150 MG PO TABS: 150.0000 mg | ORAL_TABLET | Freq: Once | ORAL | Status: DC

## 2014-06-29 NOTE — Progress Notes (Signed)
Subjective:    Patient ID: April Beck, female    DOB: 12-19-1945, 68 y.o.   MRN: [id number]  HPI Patient is here today for a follow-up. I saw the patient in September for a chronic cough. That, switch the patient from omeprazole to pantoprazole 40 mg by mouth twice a day. The patient's cough has improved. She still has a slight globus sensation in her upper throat.  Overall however she is doing very well. I checked her thyroid in September. Her TSH was slightly elevated. At that time we increased her levothyroxine. She is due to recheck her TSH at this time.  She is also due to recheck her cholesterol. She is currently on Lipitor 40 mg by mouth daily. She denies any myalgias or right upper quadrant pain. She does complain of some vaginal itching and dryness. This is been going on for approximately 1 week. She is also having occasional foul-smelling vaginal discharge. Wet prep today is significant for clue cells as well as yeast. Past Medical History  Diagnosis Date  . 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 Renal insufficiency 06/19/2012  . Anxiety     hx of panic attack  . Arthritis     hands & knees  . Hypercholesteremia   . Hypertension   . GERD (gastroesophageal reflux disease)   . Hypothyroidism    Past Surgical History  Procedure Laterality Date  . Abdominal hysterectomy    . [place] bladder sling  09/2005    Dr. Jeffie Pollock  . Cholecystectomy  06/20/2012    Procedure: LAPAROSCOPIC CHOLECYSTECTOMY;  Surgeon: Zenovia Jarred, MD;  Location: [place];  Service: General;  Laterality: N/A;   Current Outpatient Prescriptions on File Prior to Visit  Medication Sig Dispense Refill  . acetaminophen (TYLENOL) 325 MG tablet Take 2 tablets (650 mg total) by mouth every 6 (six) hours as needed for pain.      [name] acyclovir (ZOVIRAX) 400 MG tablet Take 1 tablet by mouth daily.      [name] atorvastatin (LIPITOR) 40 MG tablet TAKE 1 TABLET BY MOUTH EVERY DAY  90 tablet  3  . BYSTOLIC 5 MG tablet TAKE 1  TABLET BY MOUTH DAILY  90 tablet  3  . citalopram (CELEXA) 20 MG tablet TAKE 1 TABLET EVERY DAY  90 tablet  1  . clotrimazole-betamethasone (LOTRISONE) cream Apply 1 application topically 2 (two) times daily.  30 g  1  . hydrochlorothiazide (HYDRODIURIL) 25 MG tablet TAKE 1 TABLET EVERY DAY  90 tablet  1  . levothyroxine (SYNTHROID, LEVOTHROID) 50 MCG tablet TAKE 1 TABLET EVERY DAY  90 tablet  1  . pantoprazole (PROTONIX) 40 MG tablet Take 1 tablet (40 mg total) by mouth 2 (two) times daily.  60 tablet  3  . polyethylene glycol (MIRALAX / GLYCOLAX) packet Take 17 g by mouth daily as needed.  14 each    . potassium chloride (K-DUR,KLOR-CON) 10 MEQ tablet TAKE 1 TABLET TWICE DAILY  180 tablet  1   No current facility-administered medications on file prior to visit.   No Known Allergies History   Social History  . Marital Status: Divorced    Spouse Name: N/A    Number of Children: N/A  . Years of Education: N/A   Occupational History  . Not on file.   Social History Main Topics  . Smoking status: Never Smoker   . Smokeless tobacco: Never Used  . Alcohol Use: No  . Drug Use: No  . Sexual Activity:  Other Topics Concern  . Not on file   Social History Narrative  . No narrative on file      Review of Systems  All other systems reviewed and are negative.      Objective:   Physical Exam  Vitals reviewed. Constitutional: She appears well-developed and well-nourished.  Cardiovascular: Normal rate, regular rhythm and normal heart sounds.   No murmur heard. Pulmonary/Chest: Effort normal and breath sounds normal. No respiratory distress. She has no wheezes. She has no rales. She exhibits no tenderness.  Abdominal: Soft. Bowel sounds are normal. She exhibits no distension. There is no tenderness. There is no rebound and no guarding.          Assessment & Plan:  Vaginal odor - Plan: WET PREP FOR [place], YEAST, CLUE  Other specified hypothyroidism - Plan: TSH  HLD  (hyperlipidemia) - Plan: COMPLETE METABOLIC PANEL WITH GFR, Lipid panel   I'll treat the patient's bacterial vaginosis with Flagyl 500 mg by mouth twice a day for 7 days. I will treat her yeast infection with Diflucan 150 mg by mouth 1. Her cough is much better and therefore I will continue the pantoprazole 40 mg by mouth twice a day. I will recheck a TSH today for her thyroid. I will also check a fasting lipid panel and CMP to monitor her cholesterol. Her goal LDL cholesterol is less than 130. Her blood pressure is at goal.

## 2014-06-30 LAB — TSH: TSH: 1.637 u[IU]/mL (ref 0.350–4.500)

## 2014-07-05 ENCOUNTER — Telehealth: Payer: Self-pay | Admitting: Family Medicine

## 2014-07-05 NOTE — Telephone Encounter (Signed)
Patient needs rx sent to [facility] mail order for her atorvastatin if possible the number [phone] and the fax is [phone]

## 2014-07-06 MED ORDER — ATORVASTATIN CALCIUM 40 MG PO TABS: ORAL_TABLET | ORAL | Status: DC

## 2014-07-06 NOTE — Telephone Encounter (Signed)
Med sent to pharm 

## 2014-07-08 ENCOUNTER — Telehealth: Payer: Self-pay | Admitting: Family Medicine

## 2014-07-08 NOTE — Telephone Encounter (Signed)
Patient needs refill of  °

## 2014-07-19 ENCOUNTER — Telehealth: Payer: Self-pay | Admitting: Family Medicine

## 2014-07-19 DIAGNOSIS — R05 Cough: Secondary | ICD-10-CM

## 2014-07-19 DIAGNOSIS — R053 Chronic cough: Secondary | ICD-10-CM

## 2014-07-19 NOTE — Telephone Encounter (Signed)
Patient is calling to get refill sent to her [facility] mail order pharmacy of her pantoprozole if possible  [phone] with any questions

## 2014-07-20 MED ORDER — PANTOPRAZOLE SODIUM 40 MG PO TBEC
40.0000 mg | DELAYED_RELEASE_TABLET | Freq: Two times a day (BID) | ORAL | Status: DC
Start: 2014-07-20 — End: 2014-08-18

## 2014-07-20 NOTE — Telephone Encounter (Signed)
Med sent to pharm as requested by pt 

## 2014-07-21 ENCOUNTER — Inpatient Hospital Stay: Payer: Medicare Other | Admitting: Physical Therapist

## 2014-07-23 ENCOUNTER — Ambulatory Visit (INDEPENDENT_AMBULATORY_CARE_PROVIDER_SITE_OTHER): Payer: Medicare Other | Admitting: Orthopaedic Surgery

## 2014-07-26 ENCOUNTER — Other Ambulatory Visit: Payer: Self-pay | Admitting: Family Medicine

## 2014-07-27 ENCOUNTER — Inpatient Hospital Stay: Payer: Medicare Other | Attending: Orthopaedic Surgery | Admitting: Physical Therapist

## 2014-07-27 DIAGNOSIS — M25562 Pain in left knee: Secondary | ICD-10-CM | POA: Insufficient documentation

## 2014-07-27 NOTE — PT/OT Therapy Note (Signed)
INITIAL EVALUATION (Knee)    Name: Helen [name] Age: 68 y.o. 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   # of Authorized Visits:   Visit #   today     SUBJECTIVE: Medial and lateral L kneel pain.  About a month ago I fell while holding an 76 month old.  Fell straight on knee.  I have a brace that doc told me to wear, I am ready to stop wearing it.    Aggravating factors: Bending,  Kneeling down on knees to pray, going up and down stairs.  HIstory of torn cartilage- with history of locking, last episode 4 months.       Patient's reason for seeking PT /:   Get rid of the pain, walk up and down stairs, bend knee to pray.      Past Medical History: History of R medial meniscal tear >10 years ago with episodes of locking.     Past Medical History   Diagnosis Date   . Cancer      breast cancer   . Hyperlipidemia    . Hypertensive disorder    . Diabetes mellitus type II    . Malignant neoplasm of breast    . Breast lump    . Asthma without status asthmaticus    . Hemorrhoids without complication    . RBBB      Medications: See extensive list in meds section    Other Treatment/Prior Therapy: No  Hand Dominance:   right Involved Side:  left  Tests:          Outcome Measure:      LE Functional Score: 18%  % PSFS Score: 30%   Rate Satisfaction with Current Function: 7/10  PLOF:    Living Environment: apartment    # of Steps: none     Dwelling Entrance:   no stairs Are handrails present? na  Patient lives with:  daughter, her husband, and 3 kids.      OBJECTIVE:    Vitals:  error on machine, assess at FU as needed    Observation/Posture/Gait/Integumentary:    Observation of posture: iliac crests and GT level , anterior/ posterior    Ambulation: Decreased weight acceptance L LE with bilateral SB to WB side    Knee  Tracking: to aprox 30 degrees reports medial and lateral pressure at L knee joint line, poor posterior translation of pelvis    Girth/Edema:    Initial L     Suprapatellar Re-assess at FU     Mid Patellar 44 cm     Infrapatellar 3 inches below 37 cm     10 cm. Prox.      10 cm. Dist.            (blank fields were intentionally left blank)    Integumentary: swelling, intact skin no lesions noted  Palpation:    TTP medial joint line, medial hamstring insertion,  restriction L IT band  Patellar Mobility: hard endfeel L lateral glide, superior glide    Range of Motion: (degrees)      Right  AROM   Right PROM Knee   Left AROM   Left PROM   120  Flexion 72     0  Extension 0    (blank  fields were intentionally left blank)    Hip AROM: Assess at FU  Ankle AROM:  DF 6 bilat     Flexibility:  L SLR 35        R SLR 62     Joint mobility:  Hard end feel AP on tibia and femur    Strength:    R LE Strength  MMT = /5    L   3+ with L trunk SB and lean back Hip Flexion 3+ with R trunk SB and lean back     Hip Extension    3+ Hip Abduction 3+   4 Hip Adduction 4   4 Knee Flexion 3+   4 Knee Extension 3+    4 Ankle Dorsiflexion 4+   4 Ankle Plantarflexion 4-        (blank fields were intentionally left blank)    Functional Strength:   Sit to Stand: UE Assist  Squat:  Partial  Step-up: Unable     Special Tests/Neurological Screen:      Ligamentous Tests:  Medial knee pain with valgus stress test     Sensation to Light touch: intact    Treatment Initial Visit:  Evaluation  Patient Education (as part of TE)  Diagnosis, prognosis, POC. Rationale for improving hip and ankle AROM to improve L knee function  Therapeutic exercise with instruction in HEP and provided patient written and illustrated handout for active assist heel slides  Manual n/a  Modalities: None  Barriers to rehabilitation:obese  Rehab Potential:excellent  Is patient aware of diagnosis: Yes  For FU: Hip AROM, SLS balance    Hassell Halim, PT, MSPT  [place]  #[id number]  07/27/2014    Time In/Out:  12:15pm  - 12:55pm  Total Treatment Time:  40 minutes    Plan of Care / Updated Plan of Care IPTC Medicare Provider #: [phone]                Patient Name: Helen [name]  MRN: [id number]  [facility] Medicare #: Medicare Sub. Num: [id number]  DOI:  06/26/14  DOS: N/A  SOC: 07/27/2014     Diagnosis:     ICD-10-CM    1. Left knee pain M25.562       G Codes: Evaluation / Current: G3875 Mobility CM (80-100) Goal: G8979 Mobility CJ (20-40)  Primary Functional Deficit: Mobility: Walking and Moving Around G-codes determined by: LEFS and evaluation    ASSESSMENT: the patient is a 68 y.o. female presenting with L knee pain  who requires Physical Therapy for the following:  Impairments: joint hypomobility, decreased strength/ AROM, gait deviations    Functional Limitations: Pain with bending, kneeling down on knees to pray, going up and down stairs.     Plan Of Care: Body Mechanics Education, NMR, Proprioceptive Activites, Instruction in HEP, Therapeutic Exercise, Balance/Gait training and Soft Tissue/Joint Mobilization L LE, lumbopelvic region  Frequency/Duration: 2 times a week for 6 weeks. Anticipated D/C date: Sep 07, 2014    Certification Dates: From: 07/27/14    To: 09/07/14    Goals:   End of Certification   Date (Body Area, Impairment Goal, Functional   Activity, Target Performance) Time Frame Status Date/  Initial   07/27/2014  Patient will improve score on LEFS to >= 70% to indicate improved function  6 weeks Initial Eval     07/27/2014  Patient will improve strength by one half to one full grade in all deficient planes L LE to facilitate stair  negotiation  6 weeks Initial Eval    07/27/2014  Patient will demonstrate increased L knee flexion to >= 120 to allow kneeling  6 weeks Initial Eval    07/27/2014   Pt will demo hip hinge with neutral trunk and T-rex arms to keep load close to lift 10 lbs from knees to chest without c/o pain to increase safety with lifting for ADL's    6 weeks  Initial Eval      Signature: Hassell Halim, PT, MSPT  [place] #[id number]  Date: 07/27/2014    Signature: Bess Kinds, MD ___________________________ Date:     Patient Name: Helen [name]  MRN: [id number]

## 2014-08-02 ENCOUNTER — Ambulatory Visit (INDEPENDENT_AMBULATORY_CARE_PROVIDER_SITE_OTHER): Payer: Medicare Other | Admitting: Orthopaedic Surgery

## 2014-08-02 ENCOUNTER — Inpatient Hospital Stay: Payer: Medicare Other

## 2014-08-02 DIAGNOSIS — M25562 Pain in left knee: Secondary | ICD-10-CM

## 2014-08-02 NOTE — PT/OT Therapy Note (Signed)
DAILY NOTE   08/02/2014     Time In/Out: 12.25 - 1.00 Total Treatment Time: 35 Visit Number:  2  Pt arrives late for session    # of Authorized Visits: 12 Visit #: 2      Diagnosis (Treating/Medical):     ICD-10-CM    1. Left knee pain M25.562            Subjective:  When it rains my knee hurts, I have been working on my exercise (heel slides) and ride my stationary bike at home and at the senior center.     Objective:   Treatment:  Therapeutic Exercise:   Warm up on recumbent bike x 5 min  Supine marches with cues to brace abdominals - added to HEP  Quad sets 10x5 sec  Towel stretch x 30 sec - added to HEP    NMR:  --    Therapeutic Activities:   sit-->stand training at hi/lo table: cues scoot forward to seat edge, get feet under you and hip hinge nose forward over toes keeping spine neutral, then push through quads and squeeze glutes to push up to standing. Reverse cues for return to sit keeping nose forward over toes until able to squat to seat, then sit with control    Manual Therapy: Billed with TA  STM L distal HS, ITB, proximal quad, bony contours superior patella      Current Measurements (ROM, Strength, Girth, Outcomes, etc.):   HIP PROM supine  Flex/ER/IR R 100/ 45/ 20  Flex/ER/IR L  90/ 30 p! / 20    SLS 10 sec each R>L leans to same side    L knee flexion AROM 100 deg    Supine bridge = p! In LB, increased effort/difficulty with minimal ROM     Modalities: None       Assessment (response to treatment):   Pts' knee flexion AROM improved compared to initial measurement. Initiated sit to stan/hip hinge training off high surface and pt did well with this, will need continued practice and progress to lower surface. Pt with significant soft tissue restrictions in L thigh and reports L thigh cramping when walking esp with turning corners.     Progress towards functional goals: improved knee flexion ROM    Patient requires continued skilled care to: decrease soft tissue restrictions, increase bil LE  strength    Plan:  Continue with Plan of Care   LP  Review/Progress hip hinge/sit to std    Allean Found, DPT [id number]    08/02/2014

## 2014-08-05 ENCOUNTER — Inpatient Hospital Stay: Payer: Medicare Other

## 2014-08-06 ENCOUNTER — Other Ambulatory Visit: Payer: Self-pay | Admitting: Family Medicine

## 2014-08-06 NOTE — Telephone Encounter (Signed)
Medication refilled per protocol. 

## 2014-08-09 ENCOUNTER — Inpatient Hospital Stay: Payer: Medicare Other

## 2014-08-12 ENCOUNTER — Inpatient Hospital Stay: Payer: Medicare Other | Admitting: Physical Therapist

## 2014-08-16 ENCOUNTER — Telehealth: Payer: Self-pay | Admitting: Family Medicine

## 2014-08-16 DIAGNOSIS — R05 Cough: Secondary | ICD-10-CM

## 2014-08-16 DIAGNOSIS — R053 Chronic cough: Secondary | ICD-10-CM

## 2014-08-16 NOTE — Telephone Encounter (Signed)
Patient is calling to say that starting on jan 1st all of her prescriptions will need to go to [facility], and she is going to need refills wanted to ask if we could go ahead and send these for her  The phone number is [phone] and fax is [phone]  If any questions call her at [phone]

## 2014-08-18 ENCOUNTER — Inpatient Hospital Stay: Payer: Medicare Other

## 2014-08-18 MED ORDER — POTASSIUM CHLORIDE CRYS ER 10 MEQ PO TBCR: 10.0000 meq | EXTENDED_RELEASE_TABLET | Freq: Two times a day (BID) | ORAL | Status: DC

## 2014-08-18 MED ORDER — CITALOPRAM HYDROBROMIDE 20 MG PO TABS: 20.0000 mg | ORAL_TABLET | Freq: Every day | ORAL | Status: DC

## 2014-08-18 MED ORDER — LEVOTHYROXINE SODIUM 50 MCG PO TABS: ORAL_TABLET | ORAL | Status: DC

## 2014-08-18 MED ORDER — PANTOPRAZOLE SODIUM 40 MG PO TBEC: 40.0000 mg | DELAYED_RELEASE_TABLET | Freq: Two times a day (BID) | ORAL | Status: DC

## 2014-08-18 MED ORDER — ATORVASTATIN CALCIUM 40 MG PO TABS: 40.0000 mg | ORAL_TABLET | Freq: Every day | ORAL | Status: DC

## 2014-08-18 MED ORDER — HYDROCHLOROTHIAZIDE 25 MG PO TABS: 25.0000 mg | ORAL_TABLET | Freq: Every day | ORAL | Status: DC

## 2014-08-18 MED ORDER — NEBIVOLOL HCL 5 MG PO TABS: 5.0000 mg | ORAL_TABLET | Freq: Every day | ORAL | Status: DC

## 2014-08-18 NOTE — Telephone Encounter (Signed)
Prescription sent to pharmacy.

## 2014-08-20 ENCOUNTER — Inpatient Hospital Stay: Payer: Medicare Other

## 2014-08-20 DIAGNOSIS — M25562 Pain in left knee: Secondary | ICD-10-CM | POA: Insufficient documentation

## 2014-08-23 ENCOUNTER — Inpatient Hospital Stay: Payer: Medicare Other | Attending: Orthopaedic Surgery

## 2014-08-23 DIAGNOSIS — M25562 Pain in left knee: Secondary | ICD-10-CM

## 2014-08-23 NOTE — PT/OT Therapy Note (Signed)
DAILY NOTE   08/23/2014     Time In/Out: 1.50 - 2.30 Total Treatment Time: 40 Visit Number:  3    # of Authorized Visits: 12 Visit #: 3    Current POC through 09/07/2014    Diagnosis (Treating/Medical):     ICD-10-CM    1. Left knee pain M25.562          Subjective:  Doing exercises at home, been in a lot of pain, I accidentally knelt on my knee (because I forgot about it) and that really flared me up; then I sat in a really low kid's chair at church and that was a bad idea because it really made my knee hurt  Pt reports she has not been here in past 3 weeks because of time conflicts, depends on her daughter to drive her     Objective:   Treatment:  Therapeutic Exercise:   heel slides AROM   clamshells  LP 70 lbs x 2 min  tiltboard ML  Std calf stretch  Recumbent bike x 5 min resistance level 4    Updated HEP:  Access Code: [email or web address]   URL: [email or web address]   Date: 08/23/2014   Prepared by: Court Joy Hutchinson     Exercises  Clamshell - 15 reps - 1-3 sets - 7x weekly    NMR:  --    Therapeutic Activities:   sit-->stand training review, pt able to demo good form, painfree  Step up 8 inch pt able to demo good form, very mild p! with step down (L knee, med/lat jt line)    Manual Therapy:   STM L distal HS, ITB, quad, gastroc with quad set or DF AROM      Current Measurements (ROM, Strength, Girth, Outcomes, etc.):       Modalities: None       Assessment (response to treatment):   In spite of report of increased pain in past few days pt has been compliant with HEP and demos improved ability to transfer sit to std and demo step ups.     Progress towards functional goals:   Goals:   End of Certification    Date  (Body Area, Impairment Goal, Functional   Activity, Target Performance)  Time Frame  Status  Date/  Initial    07/27/2014  Patient will improve score on LEFS to >= 70% to indicate improved function  6 weeks  Initial Eval     07/27/2014  Patient will improve strength by one half to one full grade in all  deficient planes L LE to facilitate stair negotiation  6 weeks  Pt demos good form with 8 inch step ups 08/23/2014 [facility]   07/27/2014  Patient will demonstrate increased L knee flexion to >= 120 to allow kneeling  6 weeks  Initial Eval     07/27/2014  Pt will demo hip hinge with neutral trunk and T-rex arms to keep load close to lift 10 lbs from knees to chest without c/o pain to increase safety with lifting for ADL's 6 weeks  Initial Eval           Patient requires continued skilled care to: decrease soft tissue restrictions, increase bil LE strength    Plan:  Continue with Plan of Care   Goals  Progress sit to std to squat    Allean Found, DPT [id number]    08/23/2014

## 2014-08-24 ENCOUNTER — Telehealth: Payer: Self-pay | Admitting: Family Medicine

## 2014-08-24 MED ORDER — POTASSIUM CHLORIDE ER 10 MEQ PO TBCR: 10.0000 meq | EXTENDED_RELEASE_TABLET | Freq: Two times a day (BID) | ORAL | Status: DC

## 2014-08-24 NOTE — Telephone Encounter (Signed)
Potassium clarified for pharmacy

## 2014-08-25 ENCOUNTER — Inpatient Hospital Stay: Payer: Medicare Other

## 2014-08-25 DIAGNOSIS — M25562 Pain in left knee: Secondary | ICD-10-CM

## 2014-08-25 NOTE — PT/OT Therapy Note (Signed)
DAILY NOTE   08/25/2014     Time In/Out: 1.02 - 1.45 pm Total Treatment Time: 43 Visit Number:  4    # of Authorized Visits: 12 Visit #: 4    Current POC through 09/07/2014    Diagnosis (Treating/Medical):     ICD-10-CM    1. Left knee pain M25.562          Subjective:  I felt ok after last time, my right knee was popping after last time, mild pain from the rain right now       Objective:   Treatment:  Therapeutic Exercise: billed with MT  S/L quad stretch with strap for L LE  Supine R SKC for hip flexor stretch on L    HEP updated:  Access Code: [email or web address]   URL: [email or web address]   Date: 08/25/2014   Prepared by: Allean Found     Exercises  Clamshell - 15 reps - 1-3 sets - 7x weekly  Sidelying Quad Stretch - 2-3 sets - 15-20 hold - 1x daily - 7x weekly                   Supine Single Knee to Chest - 2-3 sets - 15-20 hold - 1x daily - 7x weekly    NMR:  --    Therapeutic Activities:   Moderate lifting(medicine ball) from moderate height (stool) with emphasis on getting close to object, hip hinge squat, look at object you are lifting and T-rex arms to keep load close as often as able    Manual Therapy:   Supine L LE  STM L quad, distal med HS, distal ITB, med jt line of L knee,   STM adductors with CR hip Er/IR  STM iliacus with hip ER AROM  Lateral and inf glide patella with quad set        Current Measurements (ROM, Strength, Girth, Outcomes, etc.):   L knee flexion AROM 110    Modalities: None       Assessment (response to treatment):   Pt's ROM improving, function improving. Remaining soft tissue restrictions L LE.        Progress towards functional goals:   Goals:   End of Certification    Date  (Body Area, Impairment Goal, Functional   Activity, Target Performance)  Time Frame  Status  Date/  Initial    07/27/2014  Patient will improve score on LEFS to >= 70% to indicate improved function  6 weeks  Initial Eval     07/27/2014  Patient will improve strength by one half to one full grade in all  deficient planes L LE to facilitate stair negotiation  6 weeks  Pt demos good form with 8 inch step ups 08/23/2014 [facility]   07/27/2014  Patient will demonstrate increased L knee flexion to >= 120 to allow kneeling  6 weeks  Improving, currently 110 deg 08/25/2014 [facility]   07/27/2014  Pt will demo hip hinge with neutral trunk and T-rex arms to keep load close to lift 10 lbs from knees to chest without c/o pain to increase safety with lifting for ADL's 6 weeks  Addressed during today's session, pt with good form 08/25/2014 [facility]         Patient requires continued skilled care to: decrease soft tissue restrictions to improve L LE mobility    Plan:  Continue with Plan of Care   STM    [place], [place] VW#[id number]    08/25/2014

## 2014-08-31 ENCOUNTER — Inpatient Hospital Stay: Payer: Medicare Other

## 2014-09-06 ENCOUNTER — Inpatient Hospital Stay: Payer: Medicare Other

## 2014-09-08 ENCOUNTER — Inpatient Hospital Stay: Payer: Medicare Other | Admitting: Physical Therapist

## 2014-09-14 ENCOUNTER — Inpatient Hospital Stay: Payer: Medicare Other

## 2014-09-17 ENCOUNTER — Inpatient Hospital Stay: Payer: Medicare Other

## 2014-10-12 DIAGNOSIS — M7741 Metatarsalgia, right foot: Secondary | ICD-10-CM | POA: Diagnosis not present

## 2014-10-12 DIAGNOSIS — H1045 Other chronic allergic conjunctivitis: Secondary | ICD-10-CM | POA: Diagnosis not present

## 2014-10-12 DIAGNOSIS — S83241A Other tear of medial meniscus, current injury, right knee, initial encounter: Secondary | ICD-10-CM | POA: Diagnosis not present

## 2014-11-01 DIAGNOSIS — H0011 Chalazion right upper eyelid: Secondary | ICD-10-CM | POA: Diagnosis not present

## 2014-11-03 ENCOUNTER — Telehealth: Payer: Self-pay | Admitting: Family Medicine

## 2014-11-03 DIAGNOSIS — D485 Neoplasm of uncertain behavior of skin: Secondary | ICD-10-CM | POA: Diagnosis not present

## 2014-11-03 DIAGNOSIS — R053 Chronic cough: Secondary | ICD-10-CM

## 2014-11-03 DIAGNOSIS — R05 Cough: Secondary | ICD-10-CM

## 2014-11-03 MED ORDER — PANTOPRAZOLE SODIUM 40 MG PO TBEC: 40.0000 mg | DELAYED_RELEASE_TABLET | Freq: Two times a day (BID) | ORAL | Status: DC

## 2014-11-03 NOTE — Telephone Encounter (Signed)
Medication refilled per protocol. 

## 2014-11-17 ENCOUNTER — Other Ambulatory Visit: Payer: Self-pay | Admitting: Family Medicine

## 2014-11-17 DIAGNOSIS — R05 Cough: Secondary | ICD-10-CM

## 2014-11-17 DIAGNOSIS — R053 Chronic cough: Secondary | ICD-10-CM

## 2014-11-17 MED ORDER — PANTOPRAZOLE SODIUM 40 MG PO TBEC: 40.0000 mg | DELAYED_RELEASE_TABLET | Freq: Two times a day (BID) | ORAL | Status: DC

## 2014-11-17 NOTE — Telephone Encounter (Signed)
Med sent to pharm 

## 2014-12-06 ENCOUNTER — Ambulatory Visit (INDEPENDENT_AMBULATORY_CARE_PROVIDER_SITE_OTHER): Payer: Self-pay | Admitting: Nurse Practitioner

## 2014-12-08 ENCOUNTER — Other Ambulatory Visit: Payer: Self-pay | Admitting: Nurse Practitioner

## 2014-12-08 DIAGNOSIS — R079 Chest pain, unspecified: Secondary | ICD-10-CM

## 2014-12-14 ENCOUNTER — Inpatient Hospital Stay
Admission: RE | Admit: 2014-12-14 | Discharge: 2014-12-14 | Disposition: A | Discharge status: Home / Self Care | Payer: Medicare Other | Source: Ambulatory Visit | Attending: Nurse Practitioner | Admitting: Nurse Practitioner

## 2014-12-14 ENCOUNTER — Other Ambulatory Visit (INDEPENDENT_AMBULATORY_CARE_PROVIDER_SITE_OTHER): Payer: Self-pay

## 2014-12-14 DIAGNOSIS — R079 Chest pain, unspecified: Secondary | ICD-10-CM | POA: Insufficient documentation

## 2014-12-14 DIAGNOSIS — R9431 Abnormal electrocardiogram [ECG] [EKG]: Secondary | ICD-10-CM | POA: Insufficient documentation

## 2014-12-14 MED ORDER — REGADENOSON 0.4 MG/5ML IV SOLN
0.4000 mg | Freq: Once | INTRAVENOUS | Status: AC | PRN
Start: 2014-12-14 — End: 2014-12-14
  Administered 2014-12-14: 0.4 mg via INTRAVENOUS
  Filled 2014-12-14: qty 5

## 2014-12-14 MED ORDER — TECHNETIUM TC 99M TETROFOSMIN INJECTION
1.0000 | Freq: Once | Status: AC | PRN
Start: 2014-12-14 — End: 2014-12-14
  Administered 2014-12-14: 1 via INTRAVENOUS

## 2014-12-22 ENCOUNTER — Encounter: Payer: Self-pay | Admitting: Family Medicine

## 2014-12-22 MED ORDER — AZITHROMYCIN 250 MG PO TABS: ORAL_TABLET | ORAL | Status: DC

## 2014-12-29 DIAGNOSIS — L0390 Cellulitis, unspecified: Secondary | ICD-10-CM | POA: Diagnosis not present

## 2015-01-04 NOTE — PT/OT Therapy Note (Signed)
Discontinuation of Therapy Services    CHRISTINA GINTZ did not complete prescribed physical therapy visits.      Status is unknown at this time, physical therapy has been discontinued and patient has been discharged from care.      Hassell Halim, PT, MSPT  [place] [phone]        01/04/2015

## 2015-01-12 ENCOUNTER — Other Ambulatory Visit: Payer: Self-pay | Admitting: Family Medicine

## 2015-01-12 NOTE — Telephone Encounter (Signed)
Refill appropriate and filled per protocol. 

## 2015-01-14 ENCOUNTER — Encounter: Payer: Self-pay | Admitting: Family Medicine

## 2015-01-14 ENCOUNTER — Ambulatory Visit (INDEPENDENT_AMBULATORY_CARE_PROVIDER_SITE_OTHER): Payer: Medicare Other | Admitting: Family Medicine

## 2015-01-14 VITALS — BP 118/74 | HR 76 | Temp 98.5°F | Resp 16 | Ht 67.5 in | Wt 185.0 lb

## 2015-01-14 DIAGNOSIS — I10 Essential (primary) hypertension: Secondary | ICD-10-CM

## 2015-01-14 DIAGNOSIS — Z0000 Encounter for general adult medical examination without abnormal findings: Secondary | ICD-10-CM | POA: Diagnosis not present

## 2015-01-14 DIAGNOSIS — E038 Other specified hypothyroidism: Secondary | ICD-10-CM

## 2015-01-14 LAB — CBC WITH DIFFERENTIAL/PLATELET
Basophils Absolute: 0 10*3/uL (ref 0.0–0.1)
Basophils Relative: 0 % (ref 0–1)
Eosinophils Absolute: 0.2 10*3/uL (ref 0.0–0.7)
Eosinophils Relative: 3 % (ref 0–5)
HCT: 40.1 % (ref 36.0–46.0)
Hemoglobin: 13.6 g/dL (ref 12.0–15.0)
Lymphocytes Relative: 34 % (ref 12–46)
Lymphs Abs: 1.7 10*3/uL (ref 0.7–4.0)
MCH: 31.3 pg (ref 26.0–34.0)
MCHC: 33.9 g/dL (ref 30.0–36.0)
MCV: 92.4 fL (ref 78.0–100.0)
MPV: 9.5 fL (ref 8.6–12.4)
Monocytes Absolute: 0.5 10*3/uL (ref 0.1–1.0)
Monocytes Relative: 9 % (ref 3–12)
Neutro Abs: 2.7 10*3/uL (ref 1.7–7.7)
Neutrophils Relative %: 54 % (ref 43–77)
Platelets: 206 10*3/uL (ref 150–400)
RBC: 4.34 MIL/uL (ref 3.87–5.11)
RDW: 13.1 % (ref 11.5–15.5)
WBC: 5 10*3/uL (ref 4.0–10.5)

## 2015-01-14 LAB — COMPLETE METABOLIC PANEL WITH GFR
ALT: 24 U/L (ref 0–35)
AST: 27 U/L (ref 0–37)
Albumin: 4.1 g/dL (ref 3.5–5.2)
Alkaline Phosphatase: 65 U/L (ref 39–117)
BUN: 17 mg/dL (ref 6–23)
CO2: 29 mEq/L (ref 19–32)
Calcium: 9.5 mg/dL (ref 8.4–10.5)
Chloride: 99 mEq/L (ref 96–112)
Creat: 1.01 mg/dL (ref 0.50–1.10)
GFR, Est African American: 66 mL/min
GFR, Est Non African American: 57 mL/min — ABNORMAL LOW
Glucose, Bld: 100 mg/dL — ABNORMAL HIGH (ref 70–99)
Potassium: 4.1 mEq/L (ref 3.5–5.3)
Sodium: 140 mEq/L (ref 135–145)
Total Bilirubin: 0.8 mg/dL (ref 0.2–1.2)
Total Protein: 6.9 g/dL (ref 6.0–8.3)

## 2015-01-14 LAB — LIPID PANEL
Cholesterol: 132 mg/dL (ref 0–200)
HDL: 41 mg/dL — ABNORMAL LOW (ref >=46)
LDL Cholesterol: 35 mg/dL (ref 0–99)
Total CHOL/HDL Ratio: 3.2 Ratio
Triglycerides: 282 mg/dL — ABNORMAL HIGH (ref <150)
VLDL: 56 mg/dL — ABNORMAL HIGH (ref 0–40)

## 2015-01-14 LAB — TSH: TSH: 1.785 u[IU]/mL (ref 0.350–4.500)

## 2015-01-14 NOTE — Progress Notes (Signed)
Subjective:    Patient ID: April Beck, female    DOB: 1946/01/27, 69 y.o.   MRN: [id number]  HPI  Patient presents today for complete physical exam. She does complain of some instability in the right knee. She also complains of pain in the posterior lateral aspect of her right knee. She is seeing an orthopedist as recommended an MRI versus arthroscopic surgery. She states that the pain is not bothering her bad enough today to warrant a cortisone injection. Aside from that, she is doing very well. She sees her gynecologist who performs her Pap smears, mammogram every year at their office. She also has a bone density test performed at that office. Prevnar 13 and Pneumovax 23 are both up-to-date. Patient is low risk and therefore I did not recommend a tetanus vaccine. We did discuss the shingles vaccine. Past Medical History  Diagnosis Date  . 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 Renal insufficiency 06/19/2012  . Anxiety     hx of panic attack  . Arthritis     hands & knees  . Hypercholesteremia   . Hypertension   . GERD (gastroesophageal reflux disease)   . Hypothyroidism    Past Surgical History  Procedure Laterality Date  . Abdominal hysterectomy    . Lynx bladder sling  09/2005    Dr. Jeffie Pollock  . Cholecystectomy  06/20/2012    Procedure: LAPAROSCOPIC CHOLECYSTECTOMY;  Surgeon: Zenovia Jarred, MD;  Location: [facility];  Service: General;  Laterality: N/A;   Current Outpatient Prescriptions on File Prior to Visit  Medication Sig Dispense Refill  . acetaminophen (TYLENOL) 325 MG tablet Take 2 tablets (650 mg total) by mouth every 6 (six) hours as needed for pain.    [name] acyclovir (ZOVIRAX) 400 MG tablet Take 1 tablet by mouth daily.    [name] atorvastatin (LIPITOR) 40 MG tablet Take 1 tablet (40 mg total) by mouth daily. 90 tablet 3  . BYSTOLIC 5 MG tablet TAKE 1 TABLET BY MOUTH EVERY DAY 90 tablet 3  . citalopram (CELEXA) 20 MG tablet Take 1 tablet (20 mg total) by mouth daily. 90  tablet 3  . hydrochlorothiazide (HYDRODIURIL) 25 MG tablet Take 1 tablet (25 mg total) by mouth daily. 90 tablet 3  . levothyroxine (SYNTHROID, LEVOTHROID) 50 MCG tablet TAKE 1 TABLET EVERY DAY 90 tablet 3  . pantoprazole (PROTONIX) 40 MG tablet Take 1 tablet (40 mg total) by mouth 2 (two) times daily. 180 tablet 1  . potassium chloride (K-DUR) 10 MEQ tablet Take 1 tablet (10 mEq total) by mouth 2 (two) times daily. 180 tablet 3   No current facility-administered medications on file prior to visit.   No Known Allergies History   Social History  . Marital Status: Divorced    Spouse Name: N/A  . Number of Children: N/A  . Years of Education: N/A   Occupational History  . Not on file.   Social History Main Topics  . Smoking status: Never Smoker   . Smokeless tobacco: Never Used  . Alcohol Use: No  . Drug Use: No  . Sexual Activity: Not on file   Other Topics Concern  . Not on file   Social History Narrative   No family history on file.   Review of Systems  All other systems reviewed and are negative.      Objective:   Physical Exam  Constitutional: She is oriented to person, place, and time. She appears well-developed and well-nourished. No distress.  HENT:  Head: Normocephalic and atraumatic.  Right Ear: External ear normal.  Left Ear: External ear normal.  Nose: Nose normal.  Mouth/Throat: Oropharynx is clear and moist. No oropharyngeal exudate.  Eyes: Conjunctivae and EOM are normal. Pupils are equal, round, and reactive to light. Right eye exhibits no discharge. Left eye exhibits no discharge. No scleral icterus.  Neck: Normal range of motion. Neck supple. No JVD present. No tracheal deviation present. No thyromegaly present.  Cardiovascular: Normal rate, regular rhythm, normal heart sounds and intact distal pulses.  Exam reveals no gallop and no friction rub.   No murmur heard. Pulmonary/Chest: Effort normal and breath sounds normal. No stridor. No respiratory  distress. She has no wheezes. She has no rales. She exhibits no tenderness.  Abdominal: Soft. Bowel sounds are normal. She exhibits no distension and no mass. There is no tenderness. There is no rebound and no guarding.  Musculoskeletal: Normal range of motion. She exhibits no edema or tenderness.  Lymphadenopathy:    She has no cervical adenopathy.  Neurological: She is alert and oriented to person, place, and time. She has normal reflexes. She displays normal reflexes. No cranial nerve deficit. She exhibits normal muscle tone. Coordination normal.  Skin: Skin is warm. No rash noted. She is not diaphoretic. No erythema. No pallor.  Psychiatric: She has a normal mood and affect. Her behavior is normal. Judgment and thought content normal.  Vitals reviewed.         Assessment & Plan:  Routine general medical examination at a health care facility - Plan: CBC with Differential/Platelet, COMPLETE METABOLIC PANEL WITH GFR, Lipid panel, TSH  Benign essential HTN - Plan: CBC with Differential/Platelet, COMPLETE METABOLIC PANEL WITH GFR, Lipid panel  Other specified hypothyroidism - Plan: TSH  Patient's physical exam today is normal. Her blood pressure is excellent. Shots are up-to-date. I did recommend the shingles vaccine but I recommended that she check on the price before she receives it. She gets her Pap smear, mammogram, breast exam, and bone density at her gynecologist. Her colonoscopy was performed in 2010 and is not due again until 2020. The remainder of her preventative care is up-to-date. I will also check a CBC, CMP, fasting lipid panel, and TSH

## 2015-01-17 ENCOUNTER — Encounter: Payer: Self-pay | Admitting: Family Medicine

## 2015-02-15 ENCOUNTER — Other Ambulatory Visit: Payer: Self-pay | Admitting: Family Medicine

## 2015-02-15 ENCOUNTER — Ambulatory Visit
Admission: RE | Admit: 2015-02-15 | Discharge: 2015-02-15 | Disposition: A | Discharge status: Home / Self Care | Payer: Medicare Other | Source: Ambulatory Visit | Attending: Family Medicine | Admitting: Family Medicine

## 2015-02-15 DIAGNOSIS — M25569 Pain in unspecified knee: Secondary | ICD-10-CM

## 2015-02-16 ENCOUNTER — Ambulatory Visit (INDEPENDENT_AMBULATORY_CARE_PROVIDER_SITE_OTHER): Payer: Medicare Other | Admitting: Orthopaedic Surgery

## 2015-02-16 ENCOUNTER — Encounter (INDEPENDENT_AMBULATORY_CARE_PROVIDER_SITE_OTHER): Payer: Self-pay | Admitting: Orthopaedic Surgery

## 2015-02-16 VITALS — Temp 98.6°F | Ht 62.0 in | Wt 179.0 lb

## 2015-02-16 DIAGNOSIS — S83289D Other tear of lateral meniscus, current injury, unspecified knee, subsequent encounter: Secondary | ICD-10-CM

## 2015-02-17 NOTE — Progress Notes (Signed)
CHIEF COMPLAINT:  Followup for the left knee.     HISTORY OF PRESENT ILLNESS:  Helen [name] comes back.  The left knee is still bothering her.  We have  given her a cortisone injection.  She has been taking anti-inflammatories  regularly.  She has had physical therapy, but the knee still catches on  her.  She says she is tough and does everything that she wants to do, but  it does cause her a lot of pain in doing it.  She is quite frustrated.     PHYSICAL EXAMINATION:  Alert and oriented female in no acute distress.  Afebrile, vital signs  stable.  Examination of bilateral lower extremities reveals she is sensory  and vascularly intact.  She has 5/5 strength in all major muscle groups.   The knee does show some effusion.  There is tenderness to palpation over  the medial and lateral joint lines, and mildly positive patellofemoral  grind.  McMurray reproduces pain as well.     IMAGING:  I reviewed her MRI again.  She does have posterior horn tears in the medial  and lateral meniscus, has a PCL tear, and she does have degenerative  changes in the knee.     IMPRESSION AND PLAN:  A 69 year old female, knee arthritis, but also marked mechanical symptoms  that seem to be due to meniscus.  I do think she is a good candidate for  arthroscopy with partial medial and lateral meniscectomy, and most likely  we will perform a chondroplasty and a synovectomy at the same time.  We  discussed the risks and benefits of this procedure in detail.  We are going  to look to get her scheduled for this in a timely fashion.

## 2015-02-21 ENCOUNTER — Other Ambulatory Visit: Payer: Self-pay | Admitting: Obstetrics and Gynecology

## 2015-02-21 DIAGNOSIS — Z6829 Body mass index (BMI) 29.0-29.9, adult: Secondary | ICD-10-CM | POA: Diagnosis not present

## 2015-02-21 DIAGNOSIS — Z1272 Encounter for screening for malignant neoplasm of vagina: Secondary | ICD-10-CM | POA: Diagnosis not present

## 2015-02-21 DIAGNOSIS — N958 Other specified menopausal and perimenopausal disorders: Secondary | ICD-10-CM | POA: Diagnosis not present

## 2015-02-21 DIAGNOSIS — Z1231 Encounter for screening mammogram for malignant neoplasm of breast: Secondary | ICD-10-CM | POA: Diagnosis not present

## 2015-02-21 DIAGNOSIS — Z124 Encounter for screening for malignant neoplasm of cervix: Secondary | ICD-10-CM | POA: Diagnosis not present

## 2015-02-21 DIAGNOSIS — M8588 Other specified disorders of bone density and structure, other site: Secondary | ICD-10-CM | POA: Diagnosis not present

## 2015-02-22 LAB — CYTOLOGY - PAP

## 2015-02-25 ENCOUNTER — Emergency Department
Admission: EM | Admit: 2015-02-25 | Discharge: 2015-02-25 | Disposition: A | Discharge status: Home / Self Care | Payer: Medicare Other | Attending: Emergency Medicine | Admitting: Emergency Medicine

## 2015-02-25 ENCOUNTER — Emergency Department: Payer: Medicare Other

## 2015-02-25 DIAGNOSIS — J45909 Unspecified asthma, uncomplicated: Secondary | ICD-10-CM | POA: Insufficient documentation

## 2015-02-25 DIAGNOSIS — I10 Essential (primary) hypertension: Secondary | ICD-10-CM | POA: Insufficient documentation

## 2015-02-25 DIAGNOSIS — K219 Gastro-esophageal reflux disease without esophagitis: Secondary | ICD-10-CM | POA: Insufficient documentation

## 2015-02-25 DIAGNOSIS — E785 Hyperlipidemia, unspecified: Secondary | ICD-10-CM | POA: Insufficient documentation

## 2015-02-25 DIAGNOSIS — M1711 Unilateral primary osteoarthritis, right knee: Secondary | ICD-10-CM | POA: Insufficient documentation

## 2015-02-25 DIAGNOSIS — E119 Type 2 diabetes mellitus without complications: Secondary | ICD-10-CM | POA: Insufficient documentation

## 2015-02-25 DIAGNOSIS — M25561 Pain in right knee: Secondary | ICD-10-CM

## 2015-02-25 MED ORDER — IBUPROFEN 600 MG PO TABS
600.0000 mg | ORAL_TABLET | Freq: Once | ORAL | Status: AC
Start: 2015-02-25 — End: 2015-02-25
  Administered 2015-02-25: 600 mg via ORAL
  Filled 2015-02-25: qty 1

## 2015-02-25 MED ORDER — TRAMADOL HCL 50 MG PO TABS
100.0000 mg | ORAL_TABLET | Freq: Once | ORAL | Status: DC
Start: 2015-02-25 — End: 2015-02-25
  Filled 2015-02-25: qty 2

## 2015-02-25 NOTE — ED Provider Notes (Addendum)
EMERGENCY DEPARTMENT HISTORY AND PHYSICAL EXAM     Physician/Midlevel provider first contact with patient: 02/25/15 0121         Date: 02/25/2015  Patient Name: Helen [name]    History of Presenting Illness     Chief Complaint   Patient presents with   . Knee Pain     right       History Provided By: Patient    Chief Complaint: Knee pain  Onset: 1 week ago  Timing: Suddenly  Location: Right knee pain  Quality: Painful and Sore   Severity: Moderate  Modifying Factors: Worse with weight bearing  Associated Symptoms: No other sxs      Additional History: DARRYL BLUMENSTEIN is a 69 y.o. female with a pertinent PMHx of DM, HTN, breast cancer, arthritis and GERD. She presents to the ED with right knee pain for 1 week. Pt reports that she kneeled down to pray, when she had a sudden severe pain to bilateral sides of right knee. Pt notes worsening pain with weight bearing and did not take any pain medications PTA. Pt reports no other sxs. She notes a planned surgery to left knee and is currently being followed by ortho for arthritis. Pt denies smoking, alcohol or drug use.       PCP: Dolan Amen, MD    Current Facility-Administered Medications   Medication Dose Route Frequency Provider Last Rate Last Dose   . lidocaine (XYLOCAINE) 1 % injection 8 mL  8 mL Intra-articular Once Hallal, Nadim L, MD       . traMADol (ULTRAM) tablet 100 mg  100 mg Oral Once Lorenza Cambridge, MD   100 mg at 02/25/15 0131   . triamcinolone acetonide (KENALOG-40) 40 MG/ML injection 80 mg  80 mg Intra-articular Once Hallal, Nadim L, MD         Current Outpatient Prescriptions   Medication Sig Dispense Refill   . albuterol (PROVENTIL) (2.5 MG/3ML) 0.083% nebulizer solution Take 2.5 mg by nebulization every 6 (six) hours as needed.     [name] amLODIPine (NORVASC) 10 MG tablet Take 10 mg by mouth daily.     [name] amoxicillin (AMOXIL) 500 MG capsule      . atorvastatin (LIPITOR) 40 MG tablet Take 40 mg by mouth daily.     . Blood Glucose Monitoring  Suppl (ONE TOUCH ULTRA 2) W/DEVICE KIT One daily 1 each 0   . fluticasone (FLONASE) 50 MCG/ACT nasal spray      . glipiZIDE (GLUCOTROL) 5 MG tablet      . glucose blood (ONE TOUCH ULTRA TEST) test strip 2 times daily 200 each 3   . ketoconazole (NIZORAL) 2 % cream      . Lancets (ONETOUCH ULTRASOFT) lancets Use to check blood sugars 2 times daily 200 each 3   . montelukast (SINGULAIR) 10 MG tablet      . NASONEX 50 MCG/ACT nasal spray   5   . nateglinide (STARLIX) 120 MG tablet Take 120 mg by mouth 3 (three) times daily before meals.     . pantoprazole (PROTONIX) 40 MG tablet Take 40 mg by mouth daily.     [name] PATADAY 0.2 % Solution   11   . SitaGLIPtin Phosphate (JANUVIA PO) Take by mouth.     . TRADJENTA 5 MG Tab   1   . valsartan-hydrochlorothiazide (DIOVAN-HCT) 160-12.5 MG per tablet Take 1 tablet by mouth daily.         Past History  Past Medical History:  Past Medical History   Diagnosis Date   . Cancer      breast cancer   . Hyperlipidemia    . Hypertensive disorder    . Diabetes mellitus type II    . Malignant neoplasm of breast    . Breast lump    . Asthma without status asthmaticus    . Hemorrhoids without complication    . RBBB        Past Surgical History:  Past Surgical History   Procedure Laterality Date   . Breast biopsy     . Hysterectomy       total       Family History:  Family History   Problem Relation Age of Onset   . Diabetes Mother    . Diabetes Sister        Social History:  History   Substance Use Topics   . Smoking status: Never Smoker    . Smokeless tobacco: Never Used   . Alcohol Use: No       Allergies:  Allergies   Allergen Reactions   . Aspirin      Breat out in sweat and dizziness        Review of Systems     Review of Systems   Constitutional: Negative for fever.   Musculoskeletal:        +right knee pain   Endo/Heme/Allergies:        Allergic to Aspirin   Psychiatric/Behavioral: Negative for substance abuse.        Physical Exam   BP 156/72 mmHg  Pulse 91  Temp(Src) 98.2 F (36.8  C) (Oral)  Resp 18  Wt 81 kg  SpO2 99%    Physical Exam   Constitutional:   WDWN, appears uncomfortable due to pain   HENT:   Head: Normocephalic and atraumatic.   Eyes: Conjunctivae are normal.   Pulmonary/Chest: Effort normal and breath sounds normal. No respiratory distress.   Musculoskeletal:        Right knee: She exhibits decreased range of motion (Unable to fully extend knee due to pain, able to flex to over 90 degrees actively) and MCL laxity. She exhibits no deformity. Tenderness found. Medial joint line and lateral joint line tenderness noted.   Distally neurovascularly intact on R   Neurological: She is alert. GCS score is 15.   Nursing note and vitals reviewed.      Diagnostic Study Results     Labs -     Results     ** No results found for the last 24 hours. **          Radiologic Studies -   Radiology Results (24 Hour)     Procedure Component Value Units Date/Time    Knee 4+ Views Right [[id number]] Resulted:  02/25/15 0159    Order Status:  Sent Updated:  02/25/15 0159      .      Medical Decision Making   I am the first provider for this patient.    I reviewed the vital signs, available nursing notes, past medical history, past surgical history, family history and social history.    Vital Signs-Reviewed the patient's vital signs.     Patient Vitals for the past 12 hrs:   BP Temp Pulse Resp   02/25/15 0122 156/72 mmHg 98.2 F (36.8 C) 91 18       Pulse Oximetry Analysis - Normal 99% on RA    Old  Medical Records: Old medical records. Nursing notes.     ED Course:     1:28 AM - Discussed with patient and family the ER plan including imaging (xray right knee), medications (Ibuprofen and Tramadol). They are agreeable.     2:06 AM -  Re-evaluated pt and reviewed imaging results. Discussed treatment plan including ace wrap, and 600mg  Ibuprofen 3 times daily for pain. Pt will follow up with Dr. Jacqulyn Bath, her orthopedist. Counseled on diagnosis, f/u plans, medication use, and signs and symptoms when to  return to ED.  Pt is stable and ready for discharge.       Provider Notes:      Diagnosis     Clinical Impression:   1. Right knee pain    2. Primary osteoarthritis of right knee        _______________________________    Attestations: This note is prepared by Alferd Apa acting as scribe for Chrissie Noa, MD    Chrissie Noa, MD- The scribe's documentation has been prepared under my direction and personally reviewed by me in its entirety.  I confirm that the note above accurately reflects all work, treatment, procedures, and medical decision making performed by me.    _______________________________          Lorenza Cambridge, MD  02/28/15 2105    Lorenza Cambridge, MD  02/28/15 2107

## 2015-02-25 NOTE — ED Notes (Signed)
C/o right knee pain x last week.  Pt stated she has been taking Ibuprofen.

## 2015-02-25 NOTE — Discharge Instructions (Signed)
Take Ibuprofen 600mg , 3 times a day for pain.      Knee Pain NOS    You have been seen for knee pain.    There are a few causes for knee pain. The doctor feels your knee pain is not from an injury to your knee s bones or ligaments.     Injury to the ligaments or bones is not the only cause of knee pain. There are other causes. These include:   Tendonitis. This is the inflammation (swelling) of the tendons. Tendons are the thick cords that connect the muscles around the knee to the bones of the knee joint.   Bursitis. This is the inflammation (swelling) of the fluid-filled sacs that cushion the knee joint.   Arthritis (inflammation of joints).   Gout (swelling of the joints).   Knee injuries from overuse.    Some things you can do to treat your knee pain are:   Apply ice to the knee with an ice pack. Be sure to put a towel between the ice pack and your skin. NEVER PLACE DIRECTLY ON YOUR SKIN. You can do this for 15 minutes at a time, several times a day.   Use anti-inflammatory medicine like ibuprofen (Advil or Motrin) to help the pain and swelling.   Avoid doing things that put a lot of stress on your knee joints. This includes running or playing tennis.    YOU SHOULD SEEK MEDICAL ATTENTION IMMEDIATELY, EITHER HERE OR AT THE NEAREST EMERGENCY DEPARTMENT, IF ANY OF THE FOLLOWING OCCUR:   Your knee pain gets worse.   You have fever (temperature higher than 100.60F / 38C) or chills or your knee gets more red or warm.   You have any other problems or concerns.

## 2015-03-04 ENCOUNTER — Ambulatory Visit (INDEPENDENT_AMBULATORY_CARE_PROVIDER_SITE_OTHER): Payer: Medicare Other | Admitting: Orthopaedic Surgery

## 2015-03-04 ENCOUNTER — Encounter (INDEPENDENT_AMBULATORY_CARE_PROVIDER_SITE_OTHER): Payer: Self-pay | Admitting: Orthopaedic Surgery

## 2015-03-04 VITALS — BP 105/63 | HR 74 | Ht 62.0 in | Wt 178.0 lb

## 2015-03-04 DIAGNOSIS — S83207D Unspecified tear of unspecified meniscus, current injury, left knee, subsequent encounter: Secondary | ICD-10-CM

## 2015-03-04 DIAGNOSIS — S83207A Unspecified tear of unspecified meniscus, current injury, left knee, initial encounter: Secondary | ICD-10-CM

## 2015-03-04 DIAGNOSIS — M1712 Unilateral primary osteoarthritis, left knee: Secondary | ICD-10-CM

## 2015-03-04 DIAGNOSIS — M25561 Pain in right knee: Secondary | ICD-10-CM

## 2015-03-04 MED ORDER — TRIAMCINOLONE ACETONIDE 40 MG/ML IJ SUSP
40.0000 mg | Freq: Once | INTRAMUSCULAR | Status: AC
Start: 2015-03-04 — End: 2015-03-04
  Administered 2015-03-04: 40 mg via INTRA_ARTICULAR

## 2015-03-04 MED ORDER — LIDOCAINE HCL 1 % IJ SOLN
4.0000 mL | Freq: Once | INTRAMUSCULAR | Status: AC
Start: 2015-03-04 — End: 2015-03-04
  Administered 2015-03-04: 4 mL via INTRA_ARTICULAR

## 2015-03-08 DIAGNOSIS — M5442 Lumbago with sciatica, left side: Secondary | ICD-10-CM | POA: Diagnosis not present

## 2015-03-08 DIAGNOSIS — M9903 Segmental and somatic dysfunction of lumbar region: Secondary | ICD-10-CM | POA: Diagnosis not present

## 2015-03-08 DIAGNOSIS — M5441 Lumbago with sciatica, right side: Secondary | ICD-10-CM | POA: Diagnosis not present

## 2015-03-09 NOTE — Progress Notes (Signed)
CHIEF COMPLAINT:  Right knee pain and followup for left knee.     HISTORY:  She comes back.  She is going to be scheduling left knee arthroscopy and  partial meniscectomy later this month.  However, she is having symptoms  from the right knee after she fell on it that is somewhat more acute.     PHYSICAL EXAMINATION:  The knee does show some swelling on the right.  On the left, there is an  effusion.  Tenderness to palpation over the joint lines on the right.   There is some tenderness to palpation directly over the patella, but  positive patellofemoral grind as well is noted.     IMPRESSION AND PLAN:  A 69 year old female with left knee meniscus tears and degenerative joint  disease.  We are going to do an arthroscopy later this month.  For the  right knee, it is more new, so we will treat it conservatively.  We will  give her a cortisone injection today and she will follow up with me again  in the future for this.     PROCEDURE:   The right knee was injected with 1 mL of Kenalog and 4 mL of lidocaine.   The patient tolerated this well.

## 2015-03-14 ENCOUNTER — Telehealth: Payer: Self-pay | Admitting: Family Medicine

## 2015-03-14 ENCOUNTER — Encounter (INDEPENDENT_AMBULATORY_CARE_PROVIDER_SITE_OTHER): Payer: Self-pay | Admitting: Orthopaedic Surgery

## 2015-03-14 DIAGNOSIS — M5441 Lumbago with sciatica, right side: Secondary | ICD-10-CM | POA: Diagnosis not present

## 2015-03-14 DIAGNOSIS — M9903 Segmental and somatic dysfunction of lumbar region: Secondary | ICD-10-CM | POA: Diagnosis not present

## 2015-03-14 DIAGNOSIS — M5442 Lumbago with sciatica, left side: Secondary | ICD-10-CM | POA: Diagnosis not present

## 2015-03-14 NOTE — Telephone Encounter (Signed)
Spoke to pt and she has hurt her back and has been going to a Restaurant manager, fast food without any results. She was wanting to know if we could refer her to a neurosurgeon. I informed pt that she would need to be seen first. She agreed and appt made.

## 2015-03-14 NOTE — Telephone Encounter (Signed)
[phone] PT is needing to speak to you about her back (she left a message on VM)

## 2015-03-15 ENCOUNTER — Ambulatory Visit (INDEPENDENT_AMBULATORY_CARE_PROVIDER_SITE_OTHER): Payer: Medicare Other | Admitting: Family Medicine

## 2015-03-15 ENCOUNTER — Encounter: Payer: Self-pay | Admitting: Family Medicine

## 2015-03-15 ENCOUNTER — Encounter (INDEPENDENT_AMBULATORY_CARE_PROVIDER_SITE_OTHER): Payer: Self-pay

## 2015-03-15 VITALS — BP 118/64 | HR 78 | Temp 98.5°F | Resp 16 | Ht 67.5 in | Wt 183.0 lb

## 2015-03-15 DIAGNOSIS — M545 Low back pain, unspecified: Secondary | ICD-10-CM

## 2015-03-15 DIAGNOSIS — M4316 Spondylolisthesis, lumbar region: Secondary | ICD-10-CM | POA: Diagnosis not present

## 2015-03-15 MED ORDER — PREDNISONE 20 MG PO TABS: ORAL_TABLET | ORAL | Status: DC

## 2015-03-15 NOTE — Progress Notes (Signed)
Subjective:    Patient ID: April Beck, female    DOB: 01/30/1946, 69 y.o.   MRN: [id number]  HPI One week ago, the patient injured her lower back trying to lift drinks out of her car. She has pain at approximately the L4-L5 level. The pain radiates into both hips and down the posterior aspect of both legs into both ankles. She denies any leg weakness. She denies any cauda equina syndrome. She denies any bowel or bladder incontinence. She saw a chiropractor who performed x-rays that did show spondylolisthesis at L4-L5. Past Medical History  Diagnosis Date  . 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 Renal insufficiency 06/19/2012  . Anxiety     hx of panic attack  . Arthritis     hands & knees  . Hypercholesteremia   . Hypertension   . GERD (gastroesophageal reflux disease)   . Hypothyroidism    Past Surgical History  Procedure Laterality Date  . Abdominal hysterectomy    . Lynx bladder sling  09/2005    Dr. Jeffie Pollock  . Cholecystectomy  06/20/2012    Procedure: LAPAROSCOPIC CHOLECYSTECTOMY;  Surgeon: Zenovia Jarred, MD;  Location: [facility];  Service: General;  Laterality: N/A;   Current Outpatient Prescriptions on File Prior to Visit  Medication Sig Dispense Refill  . acetaminophen (TYLENOL) 325 MG tablet Take 2 tablets (650 mg total) by mouth every 6 (six) hours as needed for pain.    [name] acyclovir (ZOVIRAX) 400 MG tablet Take 1 tablet by mouth daily.    [name] atorvastatin (LIPITOR) 40 MG tablet Take 1 tablet (40 mg total) by mouth daily. 90 tablet 3  . BYSTOLIC 5 MG tablet TAKE 1 TABLET BY MOUTH EVERY DAY 90 tablet 3  . citalopram (CELEXA) 20 MG tablet Take 1 tablet (20 mg total) by mouth daily. 90 tablet 3  . hydrochlorothiazide (HYDRODIURIL) 25 MG tablet Take 1 tablet (25 mg total) by mouth daily. 90 tablet 3  . levothyroxine (SYNTHROID, LEVOTHROID) 50 MCG tablet TAKE 1 TABLET EVERY DAY 90 tablet 3  . pantoprazole (PROTONIX) 40 MG tablet Take 1 tablet (40 mg total) by mouth 2 (two)  times daily. 180 tablet 1  . potassium chloride (K-DUR) 10 MEQ tablet Take 1 tablet (10 mEq total) by mouth 2 (two) times daily. 180 tablet 3   No current facility-administered medications on file prior to visit.   No Known Allergies History   Social History  . Marital Status: Divorced    Spouse Name: N/A  . Number of Children: N/A  . Years of Education: N/A   Occupational History  . Not on file.   Social History Main Topics  . Smoking status: Never Smoker   . Smokeless tobacco: Never Used  . Alcohol Use: No  . Drug Use: No  . Sexual Activity: Not on file   Other Topics Concern  . Not on file   Social History Narrative     Review of Systems  All other systems reviewed and are negative.      Objective:   Physical Exam  Cardiovascular: Normal rate, regular rhythm and normal heart sounds.   Pulmonary/Chest: Effort normal and breath sounds normal.  Musculoskeletal:       Lumbar back: She exhibits decreased range of motion, tenderness, bony tenderness, pain and spasm.  Neurological: She has normal reflexes. She displays normal reflexes. She exhibits normal muscle tone. Coordination normal.  Vitals reviewed.  positive bilateral straight leg raise  Assessment & Plan:  Back pain at L4-L5 level - Plan: predniSONE (DELTASONE) 20 MG tablet  Spondylolisthesis at L4-L5 level  Patient has low back pain and spondylolisthesis and symptoms consistent with lumbar radiculopathy. I will treat her with a prednisone taper pack. Recheck in one week. Symptoms are not improving at that time I would obtain an MRI of the lumbar spine to determine where the nerve impingement is occurring.

## 2015-03-16 ENCOUNTER — Encounter (INDEPENDENT_AMBULATORY_CARE_PROVIDER_SITE_OTHER): Payer: Self-pay

## 2015-03-16 ENCOUNTER — Ambulatory Visit: Payer: Medicare Other

## 2015-03-17 ENCOUNTER — Encounter (INDEPENDENT_AMBULATORY_CARE_PROVIDER_SITE_OTHER): Payer: Self-pay

## 2015-03-18 ENCOUNTER — Telehealth: Payer: Medicare Other

## 2015-03-21 ENCOUNTER — Encounter (INDEPENDENT_AMBULATORY_CARE_PROVIDER_SITE_OTHER): Payer: Self-pay

## 2015-03-21 ENCOUNTER — Encounter: Payer: Self-pay | Admitting: Anesthesiology

## 2015-03-21 ENCOUNTER — Ambulatory Visit (INDEPENDENT_AMBULATORY_CARE_PROVIDER_SITE_OTHER): Payer: Self-pay | Admitting: Cardiovascular Disease

## 2015-03-21 LAB — VAHRT HISTORIC LVEF: Ejection Fraction: 65 %

## 2015-03-22 ENCOUNTER — Ambulatory Visit: Admission: RE | Admit: 2015-03-22 | Payer: Medicare Other | Source: Ambulatory Visit | Admitting: Orthopaedic Surgery

## 2015-03-22 ENCOUNTER — Encounter: Admission: RE | Payer: Self-pay | Source: Ambulatory Visit

## 2015-03-22 HISTORY — DX: Unspecified cataract: H26.9

## 2015-03-22 HISTORY — DX: Anemia, unspecified: D64.9

## 2015-03-22 HISTORY — DX: Gastro-esophageal reflux disease without esophagitis: K21.9

## 2015-03-22 HISTORY — DX: Unspecified osteoarthritis, unspecified site: M19.90

## 2015-03-22 SURGERY — ARTHROSCOPY, KNEE
Anesthesia: General | Site: Knee | Laterality: Left

## 2015-04-01 ENCOUNTER — Other Ambulatory Visit: Payer: Self-pay | Admitting: Family Medicine

## 2015-04-01 NOTE — Telephone Encounter (Signed)
Medication refilled per protocol. 

## 2015-04-12 ENCOUNTER — Encounter (INDEPENDENT_AMBULATORY_CARE_PROVIDER_SITE_OTHER): Payer: Self-pay

## 2015-04-21 ENCOUNTER — Ambulatory Visit: Payer: Medicare Other | Admitting: Anesthesiology

## 2015-04-21 ENCOUNTER — Encounter: Payer: Self-pay | Admitting: Anesthesiology

## 2015-04-21 ENCOUNTER — Ambulatory Visit
Admission: RE | Admit: 2015-04-21 | Discharge: 2015-04-21 | Disposition: A | Discharge status: Home / Self Care | Payer: Medicare Other | Source: Ambulatory Visit | Attending: Orthopaedic Surgery | Admitting: Orthopaedic Surgery

## 2015-04-21 ENCOUNTER — Encounter (INDEPENDENT_AMBULATORY_CARE_PROVIDER_SITE_OTHER): Payer: Self-pay | Admitting: Orthopaedic Surgery

## 2015-04-21 ENCOUNTER — Ambulatory Visit: Payer: Medicare Other | Admitting: Orthopaedic Surgery

## 2015-04-21 ENCOUNTER — Encounter
Admission: RE | Disposition: A | Discharge status: Home / Self Care | Payer: Self-pay | Source: Ambulatory Visit | Attending: Orthopaedic Surgery

## 2015-04-21 DIAGNOSIS — X58XXXA Exposure to other specified factors, initial encounter: Secondary | ICD-10-CM | POA: Insufficient documentation

## 2015-04-21 DIAGNOSIS — K219 Gastro-esophageal reflux disease without esophagitis: Secondary | ICD-10-CM | POA: Insufficient documentation

## 2015-04-21 DIAGNOSIS — Z853 Personal history of malignant neoplasm of breast: Secondary | ICD-10-CM | POA: Insufficient documentation

## 2015-04-21 DIAGNOSIS — I4510 Unspecified right bundle-branch block: Secondary | ICD-10-CM | POA: Insufficient documentation

## 2015-04-21 DIAGNOSIS — E119 Type 2 diabetes mellitus without complications: Secondary | ICD-10-CM | POA: Insufficient documentation

## 2015-04-21 DIAGNOSIS — E785 Hyperlipidemia, unspecified: Secondary | ICD-10-CM | POA: Insufficient documentation

## 2015-04-21 DIAGNOSIS — I10 Essential (primary) hypertension: Secondary | ICD-10-CM | POA: Insufficient documentation

## 2015-04-21 DIAGNOSIS — D649 Anemia, unspecified: Secondary | ICD-10-CM | POA: Insufficient documentation

## 2015-04-21 DIAGNOSIS — J45909 Unspecified asthma, uncomplicated: Secondary | ICD-10-CM | POA: Insufficient documentation

## 2015-04-21 DIAGNOSIS — S83282A Other tear of lateral meniscus, current injury, left knee, initial encounter: Secondary | ICD-10-CM | POA: Insufficient documentation

## 2015-04-21 DIAGNOSIS — S83242A Other tear of medial meniscus, current injury, left knee, initial encounter: Secondary | ICD-10-CM | POA: Insufficient documentation

## 2015-04-21 HISTORY — PX: ARTHROSCOPY, KNEE: SHX3174

## 2015-04-21 LAB — GLUCOSE WHOLE BLOOD - POCT: Whole Blood Glucose POCT: 187 mg/dL — ABNORMAL HIGH (ref 70–100)

## 2015-04-21 SURGERY — ARTHROSCOPY, KNEE
Anesthesia: Anesthesia General | Site: Knee | Laterality: Left | Wound class: Clean

## 2015-04-21 MED ORDER — SODIUM CHLORIDE 0.9 % IV SOLN
INTRAVENOUS | Status: AC
Start: 2015-04-21 — End: ?
  Filled 2015-04-21: qty 100

## 2015-04-21 MED ORDER — EPINEPHRINE HCL 1 MG/ML IJ SOLN
INTRAMUSCULAR | Status: DC | PRN
Start: 2015-04-21 — End: 2015-04-21
  Administered 2015-04-21: 14:00:00 1 mg

## 2015-04-21 MED ORDER — HYDROMORPHONE HCL 1 MG/ML IJ SOLN
INTRAMUSCULAR | Status: AC
Start: 2015-04-21 — End: ?
  Filled 2015-04-21: qty 1

## 2015-04-21 MED ORDER — CEFAZOLIN SODIUM 1 G IJ SOLR
1.0000 g | INTRAMUSCULAR | Status: DC
Start: 2015-04-21 — End: 2015-04-21
  Administered 2015-04-21: 1 g via INTRAVENOUS

## 2015-04-21 MED ORDER — HYDROMORPHONE HCL 1 MG/ML IJ SOLN
0.5000 mg | INTRAMUSCULAR | Status: DC | PRN
Start: 2015-04-21 — End: 2015-04-21

## 2015-04-21 MED ORDER — DIPHENHYDRAMINE HCL 50 MG/ML IJ SOLN
50.0000 mg | Freq: Four times a day (QID) | INTRAMUSCULAR | Status: DC | PRN
Start: 2015-04-21 — End: 2015-04-21

## 2015-04-21 MED ORDER — LIDOCAINE HCL 2 % IJ SOLN
INTRAMUSCULAR | Status: DC | PRN
Start: 2015-04-21 — End: 2015-04-21
  Administered 2015-04-21: 80 mg

## 2015-04-21 MED ORDER — PROPOFOL 10 MG/ML IV EMUL (WRAP)
INTRAVENOUS | Status: AC
Start: 2015-04-21 — End: ?
  Filled 2015-04-21: qty 20

## 2015-04-21 MED ORDER — LACTATED RINGERS IV SOLN
75.0000 mL/h | INTRAVENOUS | Status: DC
Start: 2015-04-21 — End: 2015-04-21

## 2015-04-21 MED ORDER — METOCLOPRAMIDE HCL 5 MG/ML IJ SOLN
10.0000 mg | Freq: Once | INTRAMUSCULAR | Status: DC | PRN
Start: 2015-04-21 — End: 2015-04-21

## 2015-04-21 MED ORDER — LIDOCAINE HCL (PF) 2 % IJ SOLN
INTRAMUSCULAR | Status: AC
Start: 2015-04-21 — End: ?
  Filled 2015-04-21: qty 5

## 2015-04-21 MED ORDER — ACETAMINOPHEN-CODEINE #3 300-30 MG PO TABS
1.0000 | ORAL_TABLET | ORAL | Status: AC | PRN
Start: 2015-04-21 — End: 2015-05-01

## 2015-04-21 MED ORDER — ONDANSETRON HCL 4 MG/2ML IJ SOLN
INTRAMUSCULAR | Status: DC | PRN
Start: 2015-04-21 — End: 2015-04-21
  Administered 2015-04-21: 4 mg via INTRAVENOUS

## 2015-04-21 MED ORDER — ONDANSETRON HCL 4 MG/2ML IJ SOLN
INTRAMUSCULAR | Status: AC
Start: 2015-04-21 — End: ?
  Filled 2015-04-21: qty 2

## 2015-04-21 MED ORDER — LACTATED RINGERS IV SOLN
INTRAVENOUS | Status: DC | PRN
Start: 2015-04-21 — End: 2015-04-21

## 2015-04-21 MED ORDER — FENTANYL CITRATE (PF) 50 MCG/ML IJ SOLN (WRAP)
INTRAMUSCULAR | Status: AC
Start: 2015-04-21 — End: ?
  Filled 2015-04-21: qty 2

## 2015-04-21 MED ORDER — EPHEDRINE SULFATE 50 MG/ML IJ SOLN
INTRAMUSCULAR | Status: DC | PRN
Start: 2015-04-21 — End: 2015-04-21
  Administered 2015-04-21: 10 mg via INTRAVENOUS

## 2015-04-21 MED ORDER — BUPIVACAINE-EPINEPHRINE (PF) 0.5% -1:200000 IJ SOLN
INTRAMUSCULAR | Status: AC
Start: 2015-04-21 — End: ?
  Filled 2015-04-21: qty 30

## 2015-04-21 MED ORDER — HYDROMORPHONE HCL PF 1 MG/ML IJ SOLN
INTRAMUSCULAR | Status: DC | PRN
Start: 2015-04-21 — End: 2015-04-21
  Administered 2015-04-21 (×2): 0.5 mg via INTRAVENOUS

## 2015-04-21 MED ORDER — ONDANSETRON HCL 4 MG/2ML IJ SOLN
4.0000 mg | Freq: Once | INTRAMUSCULAR | Status: DC | PRN
Start: 2015-04-21 — End: 2015-04-21

## 2015-04-21 MED ORDER — EPHEDRINE SULFATE 50 MG/ML IJ SOLN
INTRAMUSCULAR | Status: AC
Start: 2015-04-21 — End: ?
  Filled 2015-04-21: qty 1

## 2015-04-21 MED ORDER — IBUPROFEN 800 MG PO TABS
800.0000 mg | ORAL_TABLET | Freq: Four times a day (QID) | ORAL | Status: AC | PRN
Start: 2015-04-21 — End: 2015-05-01

## 2015-04-21 MED ORDER — OXYCODONE-ACETAMINOPHEN 5-325 MG PO TABS
1.0000 | ORAL_TABLET | Freq: Once | ORAL | Status: DC | PRN
Start: 2015-04-21 — End: 2015-04-21

## 2015-04-21 MED ORDER — BUPIVACAINE-EPINEPHRINE (PF) 0.5% -1:200000 IJ SOLN
INTRAMUSCULAR | Status: DC | PRN
Start: 2015-04-21 — End: 2015-04-21
  Administered 2015-04-21: 30 mL via INTRAMUSCULAR

## 2015-04-21 MED ORDER — FENTANYL CITRATE (PF) 50 MCG/ML IJ SOLN (WRAP)
25.0000 ug | INTRAMUSCULAR | Status: DC | PRN
Start: 2015-04-21 — End: 2015-04-21

## 2015-04-21 MED ORDER — FENTANYL CITRATE (PF) 50 MCG/ML IJ SOLN (WRAP)
INTRAMUSCULAR | Status: DC | PRN
Start: 2015-04-21 — End: 2015-04-21
  Administered 2015-04-21 (×3): 25 ug via INTRAVENOUS
  Administered 2015-04-21: 50 ug via INTRAVENOUS
  Administered 2015-04-21: 25 ug via INTRAVENOUS

## 2015-04-21 MED ORDER — PROPOFOL 10 MG/ML IV EMUL (WRAP)
INTRAVENOUS | Status: DC | PRN
Start: 2015-04-21 — End: 2015-04-21
  Administered 2015-04-21: 200 mg via INTRAVENOUS

## 2015-04-21 MED ORDER — CEFAZOLIN SODIUM 1 G IJ SOLR
INTRAMUSCULAR | Status: AC
Start: 2015-04-21 — End: ?
  Filled 2015-04-21: qty 1000

## 2015-04-21 MED ORDER — PHENYLEPHRINE HCL 10 MG/ML IV SOLN (WRAP)
Status: DC | PRN
Start: 2015-04-21 — End: 2015-04-21
  Administered 2015-04-21: 50 ug via INTRAVENOUS
  Administered 2015-04-21: 100 ug via INTRAVENOUS

## 2015-04-21 MED ORDER — PHENYLEPHRINE 100 MCG/ML IN NACL 0.9% IV SOSY
PREFILLED_SYRINGE | INTRAVENOUS | Status: AC
Start: 2015-04-21 — End: ?
  Filled 2015-04-21: qty 5

## 2015-04-21 SURGICAL SUPPLY — 31 items
BLADE ORBIT INCISOR CRVD 4.5 (Ortho Supply) ×2 IMPLANT
BLADE ORBIT RAZR CUT CRVD 4.5 (Ortho Supply) ×2 IMPLANT
BLADE S/SU RIBBACK CARB STL 11 (Blade) ×2 IMPLANT
DRAPE 3/4 SHEET FANFLD 52X76IN (Drape) ×2 IMPLANT
DRAPE LWR EXT 114X88X126IN (Drape) ×2 IMPLANT
DRAPE SRG PLS U STRDRP 51X47IN LF STRL (Drape) ×2
DRAPE SURGICAL ADHESIVE L51 IN X W47 IN (Drape) ×1 IMPLANT
DRAPE SURGICAL ADHESIVE L51 IN X W47 IN STERI-DRAPE CLEAR (Drape) ×1 IMPLANT
DRESSING PETRO 3% BI 3BRM GZE XR 8X1IN (Dressing) ×2
DRESSING PETROLATUM XEROFORM L8 IN X W1 (Dressing) ×1 IMPLANT
DRESSING PETROLATUM XEROFORM L8 IN X W1 IN 3% BISMUTH TRIBROMOPHENATE (Dressing) ×1 IMPLANT
GLOVE SRG NTR RBR 8 INDCTR BGL 299X103MM (Glove) ×2
GLOVE SURG LTX SZ 8 STRL (Glove) ×2 IMPLANT
GLOVE SURGICAL 8 INDICATOR BIOGEL POWDER (Glove) ×1 IMPLANT
GLOVE SURGICAL 8 INDICATOR BIOGEL POWDER FREE SMOOTH BEAD CUFF (Glove) ×1 IMPLANT
PACK ARTHROSCOPY IAH (Pack) ×2 IMPLANT
PAD ABD PVC CRTY 9X5IN LF STRL 3 LYR (Dressing) ×2 IMPLANT
SET L10 FT INFLOW 10K TUBING ARTHROSCOPY (Tubing) ×1 IMPLANT
SET TUBING 10K 10FT LF STRL INFL DISP (Tubing) ×2
SOLUTION IRR 0.9% NACL 3L ARTHMTC LF (Irrigation Solutions) ×4
SOLUTION IRRIGATION 0.9% SODIUM CHLORIDE (Irrigation Solutions) ×2 IMPLANT
SOLUTION SRGPRP 74% ISPRP 0.7% IOD (Prep) ×2
SOLUTION SURGICAL PREP 26 ML DURAPREP (Prep) ×1 IMPLANT
SOLUTION SURGICAL PREP 26 ML DURAPREP 74% ISOPROPYL ALCOHOL 0.7% (Prep) ×1 IMPLANT
SPONGE GAUZE L6 3/4 IN X W6 IN MEDIUM (Dressing) ×1 IMPLANT
SPONGE GAUZE L6 3/4 IN X W6 IN MEDIUM ABSORBENT FLUFF DRY CRINKLE (Dressing) ×1 IMPLANT
SPONGE GZE CTTN MED KRLX 6.75X6IN LF (Dressing) ×2
SUTURE ETHILON BLACK 3-0 PS-2 L18 IN (Suture) ×1 IMPLANT
SUTURE ETHILON BLACK 3-0 PS-2 L18 IN MONOFILAMENT NONABSORBABLE (Suture) ×1 IMPLANT
SUTURE NABSB 3-0 PS2 ETH MTPS 18IN MFL (Suture) ×2
TOURNIQUET 34IN STRL (Procedure Accessories) ×2 IMPLANT

## 2015-04-21 NOTE — Anesthesia Postprocedure Evaluation (Signed)
Anesthesia Post Evaluation    Patient: Helen [name]    Procedures performed: Procedure(s) with comments:  ARTHROSCOPY, KNEE PARTIAL MEDIAL AND LATERAL MENISECTOMY LEFT KNEE - partial medial menisectomy    Anesthesia type: General LMA    Patient location:Phase I PACU    Last vitals:   Filed Vitals:    04/21/15 1354   BP: 141/67   Pulse: 77   Temp: 36.4 C (97.5 F)   Resp: 16   SpO2: 100%       Post pain: Patient not complaining of pain, continue current therapy      Mental Status:awake and alert     Respiratory Function: tolerating room air    Cardiovascular: stable    Nausea/Vomiting: patient not complaining of nausea or vomiting    Hydration Status: adequate    Post assessment: no apparent anesthetic complications, no reportable events and no evidence of recall

## 2015-04-21 NOTE — Brief Op Note (Signed)
BRIEF OP NOTE    Date Time: 04/21/2015 1:51 PM    Patient Name:   Helen [name]    Date of Operation:   04/21/2015    Providers Performing:   Surgeon(s):  Tedd Cottrill, Benjaman Lobe, MD    Assistant (s):   Circulator: Vashisht, Marya Fossa, RN  Relief Scrub: Linwood Dibbles, RN  Scrub Person: Arther Dames, RN  First Assistant: Melida Quitter    Operative Procedure:   Procedure(s):  ARTHROSCOPY, KNEE PARTIAL MEDIAL AND LATERAL MENISECTOMY LEFT KNEE    Preoperative Diagnosis:   Pre-Op Diagnosis Codes:     * Meniscus tear, left, subsequent encounter [S83.207D]     * Knee pain, left [M25.562]    Postoperative Diagnosis:   Post-Op Diagnosis Codes:     * Meniscus tear, left, subsequent encounter [S83.207D]     * Knee pain, left [M25.562]    Anesthesia:   General    Estimated Blood Loss:    * No values recorded between 04/21/2015  1:28 PM and 04/21/2015  1:51 PM *    Implants:   * No implants in log *    Drains:       Specimens:       Findings:   Medial and lateral meniscus tears, mild djd changes    Complications:   none      Signed by: Bess Kinds, MD                                                                           [facility]

## 2015-04-21 NOTE — Anesthesia Preprocedure Evaluation (Signed)
Anesthesia Evaluation    AIRWAY    Mallampati: III    TM distance: >3 FB  Neck ROM: full  Mouth Opening:full   CARDIOVASCULAR    cardiovascular exam normal, regular and normal       DENTAL    no notable dental hx     PULMONARY    pulmonary exam normal and clear to auscultation     OTHER FINDINGS    Patient is otherwise healthy.                  Anesthesia Plan    ASA 3     general                     intravenous induction   Detailed anesthesia plan: general LMA  Monitors/Adjuncts: other    Post Op: other  Post op pain management: other    informed consent obtained    Plan discussed with CRNA.      pertinent labs reviewed

## 2015-04-21 NOTE — Discharge Instructions (Signed)
Weight bearing as tolerated.  Pain meds as needed.  Remove dressing day 3 and place band-aids over incisions.  Keep incisions dry first week.  Follow up Dr. Caryl Asp in 2 weeks.    Post Anesthesia Discharge Instructions    Although you may be awake and alert in the recovery room, small amounts of anesthetic remain in your system for about 24 hours.  You may feel tired and sleepy during this time.      You are advised to go directly home from the hospital.    Plan to stay at home and rest for the remainder of the day.    It is advisable to have someone with you at home for 24 hours after surgery.    Do not operate a motor vehicle, or any mechanical or electrical equipment for the next 24 hours.      Be careful when you are walking around, you may become dizzy.  The effects of anesthesia and/or medications are still present and drowsiness may occur    Do not consume alcohol, tranquilizers, sleeping medications, or any other non prescribed medication for the remainder of the day.    Diet:  begin with liquids, progress your diet as tolerated or as directed by your surgeon.  Nausea and vomiting may occur in the next 24 hours.

## 2015-04-21 NOTE — Transfer of Care (Signed)
Anesthesia Transfer of Care Note    Patient: Helen [name]    Procedures performed: Procedure(s) with comments:  ARTHROSCOPY, KNEE PARTIAL MEDIAL AND LATERAL MENISECTOMY LEFT KNEE - partial medial menisectomy    Anesthesia type: General LMA    Patient location:Phase I PACU    Last vitals:   Filed Vitals:    04/21/15 1354   BP: 141/67   Pulse: 77   Temp: 36.4 C (97.5 F)   Resp: 16   SpO2: 100%       Post pain: Patient not complaining of pain, continue current therapy      Mental Status:sedated    Respiratory Function: tolerating nasal cannula    Cardiovascular: stable    Nausea/Vomiting: patient not complaining of nausea or vomiting    Hydration Status: adequate    Post assessment: no apparent anesthetic complications, no reportable events and no evidence of recall

## 2015-04-21 NOTE — H&P (Signed)
ADMISSION HISTORY AND PHYSICAL EXAM    Date Time: 04/21/2015 12:46 PM  Patient Name: Helen Helen [name]  Attending Physician: Bess Kinds, MD    Assessment:   L knee djd, meniscus tear    Plan:   OR for diagnostic and operative arthroscopy    History of Present Illness:   Helen Helen [name] is Helen [name] 69 y.o. female who presents to the hospital with painful L knee and mechanical symptoms.  Failed conservative treatment.    Past Medical History:     Past Medical History   Diagnosis Date   . Cancer      breast cancer   . Hyperlipidemia    . Hypertensive disorder    . Diabetes mellitus type II    . Malignant neoplasm of breast    . Breast lump    . Asthma without status asthmaticus    . Hemorrhoids without complication    . RBBB    . Gastroesophageal reflux disease    . Anemia      iron    currently on medication     . Arthritis      general body     . Bilateral cataracts        Past Surgical History:     Past Surgical History   Procedure Laterality Date   . Breast biopsy     . Hysterectomy       total   . Tumor removal Left      neck  benign        Family History:     Family History   Problem Relation Age of Onset   . Diabetes Mother    . Diabetes Sister    . Malignant hyperthermia Neg Hx    . Pseudochol deficiency Neg Hx    . Anesthesia problems Neg Hx        Social History:     Social History     Social History   . Marital Status: Legally Separated     Spouse Name: N/Helen [name]   . Number of Children: N/Helen [name]   . Years of Education: N/Helen [name]     Social History Main Topics   . Smoking status: Never Smoker    . Smokeless tobacco: Never Used   . Alcohol Use: No   . Drug Use: No   . Sexual Activity: No     Other Topics Concern   . Not on file     Social History Narrative       Allergies:     Allergies   Allergen Reactions   . Aspirin      Breat out in sweat and dizziness    . Pollen Extract    . Latex Itching and Rash       Medications:     Facility-administered medications prior to admission   Medication   . lidocaine (XYLOCAINE) 1 %  injection 8 mL   . triamcinolone acetonide (KENALOG-40) 40 MG/ML injection 80 mg     Prescriptions prior to admission   Medication Sig   . albuterol (PROVENTIL) (2.5 MG/3ML) 0.083% nebulizer solution Take 2.5 mg by nebulization every 6 (six) hours as needed.   [name] amLODIPine (NORVASC) 10 MG tablet Take 10 mg by mouth daily.   [name] atorvastatin (LIPITOR) 40 MG tablet Take 40 mg by mouth daily.   . fluticasone (FLONASE) 50 MCG/ACT nasal spray    . glipiZIDE (GLUCOTROL) 5 MG tablet    . NASONEX 50 MCG/ACT nasal spray  as needed.      . nateglinide (STARLIX) 120 MG tablet Take 120 mg by mouth 3 (three) times daily before meals.   . pantoprazole (PROTONIX) 40 MG tablet Take 40 mg by mouth daily.   [name] PATADAY 0.2 % Solution    . TRADJENTA 5 MG Tab    . valsartan-hydrochlorothiazide (DIOVAN-HCT) 160-12.5 MG per tablet Take 1 tablet by mouth daily.   [name] amoxicillin (AMOXIL) 500 MG capsule    . Blood Glucose Monitoring Suppl (ONE TOUCH ULTRA 2) W/DEVICE KIT One daily   . glucose blood (ONE TOUCH ULTRA TEST) test strip 2 times daily   . Lancets (ONETOUCH ULTRASOFT) lancets Use to check blood sugars 2 times daily   . montelukast (SINGULAIR) 10 MG tablet        Review of Systems:   Helen [name] comprehensive review of systems was: negative    Physical Exam:     Filed Vitals:    04/21/15 1142   BP: 125/70   Pulse: 82   Temp: 97.4 F (36.3 C)   SpO2: 99%       Intake and Output Summary (Last 24 hours) at Date Time  No intake or output data in the 24 hours ending 04/21/15 1246    PE:  Helen [name]  PERRL sclera non icteric  CN's intact normocephalic  Normal chest excursion, no labored breathing  Good pulses RRR  NVI all extremities      Labs:     Results     Procedure Component Value Units Date/Time    Glucose Whole Blood - POCT [[id number]]  (Abnormal) Collected:  04/21/15 1234     POCT - Glucose Whole blood 187 (H) mg/dL Updated:  74/25/95 6387              Rads:   Radiological Procedure reviewed.    Signed by: Bess Kinds

## 2015-04-22 ENCOUNTER — Encounter: Payer: Self-pay | Admitting: Orthopaedic Surgery

## 2015-04-22 NOTE — Op Note (Signed)
PREOPERATIVE DIAGNOSIS:  Left knee meniscus tear medially and laterally.     TITLE OF PROCEDURE:  Diagnostic arthroscopy of the knee with partial medial and lateral  meniscectomy.     SURGEON:  Richardson Chiquito, MD.     Threasa HeadsFrann Rider     ANESTHESIA:  General.     DESCRIPTION OF PROCEDURE:  The patient was seen in the preoperative area.  Consent was obtained.  The  correct extremity was marked.  She was taken to the OR.  Anesthesia was  begun.  Correct extremity was prepped and draped in the usual sterile  manner.  Timeout was held.  Extremity was exsanguinated, tourniquet was  inflated.  Diagnostic arthroscopy was undertaken of the knee joint.   Patellofemoral compartment did show some degenerative changes certainly.   In the medial and lateral compartment these were really more mild.  There  was a posterior horn medial meniscus tear and also a lateral meniscus tear.   I used the shaver to perform partial meniscectomy here leaving a stable  arm of both menisci.  The remainder of the knee was largely unremarkable.   We lavaged the knee and closed with nylon the portals.  Dressing was  applied.  The patient was awoken by anesthesia and was transferred to PACU  in stable condition.

## 2015-04-28 ENCOUNTER — Telehealth (INDEPENDENT_AMBULATORY_CARE_PROVIDER_SITE_OTHER): Payer: Self-pay

## 2015-04-28 NOTE — Telephone Encounter (Signed)
Helen [name] called to report nausea which developed approximately two days after surgery; some loose stools, no fever, no abdominal pain. She is not taking any medications which would cause nausea (no pain medications). We discussed that she is eating lightly. I advised that she contact her PCP to discuss and also gave ER precautions, if needed. [name] is planning to call her PCP. EP

## 2015-05-06 ENCOUNTER — Encounter (INDEPENDENT_AMBULATORY_CARE_PROVIDER_SITE_OTHER): Payer: Self-pay | Admitting: Orthopaedic Surgery

## 2015-05-06 ENCOUNTER — Ambulatory Visit (INDEPENDENT_AMBULATORY_CARE_PROVIDER_SITE_OTHER): Payer: Medicare Other | Admitting: Orthopaedic Surgery

## 2015-05-06 VITALS — BP 105/61 | HR 75 | Temp 98.6°F | Ht 62.0 in | Wt 179.0 lb

## 2015-05-06 DIAGNOSIS — S83207D Unspecified tear of unspecified meniscus, current injury, left knee, subsequent encounter: Secondary | ICD-10-CM

## 2015-05-06 DIAGNOSIS — M1711 Unilateral primary osteoarthritis, right knee: Secondary | ICD-10-CM

## 2015-05-06 MED ORDER — TRIAMCINOLONE ACETONIDE 40 MG/ML IJ SUSP
40.0000 mg | Freq: Once | INTRAMUSCULAR | Status: AC
Start: 2015-05-06 — End: 2015-05-06
  Administered 2015-05-06: 40 mg via INTRA_ARTICULAR

## 2015-05-06 MED ORDER — LIDOCAINE HCL 1 % IJ SOLN
4.0000 mL | Freq: Once | INTRAMUSCULAR | Status: AC
Start: 2015-05-06 — End: 2015-05-06
  Administered 2015-05-06: 4 mL via INTRA_ARTICULAR

## 2015-05-06 NOTE — Progress Notes (Signed)
CHIEF COMPLAINT:  Follow up for the knee.     HISTORY:  She is doing well.  Pain is under control.  Denies any fevers or chills.   She does have pain in the contralateral knee, the right knee.  Left knee  status post arthroscopy.  He is doing quite well.     PHYSICAL EXAMINATION:  Portals well healed.  Neurovascularly intact.  On the left side full knee  range of motion, very minimal effusion.  On the right she has marked  tenderness to palpation over the medial joint line.  Positive  patellofemoral grind.  Physical examination of the right knee reveals  tenderness to palpation in the medial joint line, positive patellofemoral  grind.     IMPRESSION AND PLAN:  Status post knee arthroscopy left knee, doing well.  The right knee has  arthritis and it is really bothering her.  She would benefit from a  cortisone injection in the right knee.  We will also give her physical  therapy for both knees.     PROCEDURE:  Her right knee was injected with 1 mL Kenalog, 4 mL of lidocaine.  The  patient tolerated this well.

## 2015-05-24 ENCOUNTER — Ambulatory Visit (INDEPENDENT_AMBULATORY_CARE_PROVIDER_SITE_OTHER): Payer: Medicare Other | Admitting: Family Medicine

## 2015-05-24 ENCOUNTER — Encounter: Payer: Self-pay | Admitting: Family Medicine

## 2015-05-24 VITALS — BP 110/68 | HR 76 | Temp 98.2°F | Resp 16 | Ht 67.5 in | Wt 184.0 lb

## 2015-05-24 DIAGNOSIS — G541 Lumbosacral plexus disorders: Secondary | ICD-10-CM

## 2015-05-24 MED ORDER — GABAPENTIN 300 MG PO CAPS: 300.0000 mg | ORAL_CAPSULE | Freq: Three times a day (TID) | ORAL | Status: DC

## 2015-05-24 MED ORDER — PREDNISONE 20 MG PO TABS: ORAL_TABLET | ORAL | Status: DC

## 2015-05-24 NOTE — Progress Notes (Signed)
Subjective:    Patient ID: April Beck, female    DOB: 1945-09-28, 69 y.o.   MRN: [id number]  HPI Patient reports 2 months of burning pain around her rectum and her sacrum and her gluteus muscles. On examination today there is no visible rash on her gluteus, in between the gluteal cleft. Patient describes it as bee stings and pins and needles pain all around her sacrum and under the skin covering her gluteus. It is worse with sitting. She is unable to lay on her back at night because it exacerbates it. She denies any weakness or pain radiating into her legs. She denies any history of shingles in that area although her symptoms sound like a postherpetic neuralgia. She did have low back pain approximately 2 months ago that was given prednisone and the symptoms went away temporarily. Past Medical History  Diagnosis Date  . 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 Renal insufficiency 06/19/2012  . Anxiety     hx of panic attack  . Arthritis     hands & knees  . Hypercholesteremia   . Hypertension   . GERD (gastroesophageal reflux disease)   . Hypothyroidism    Past Surgical History  Procedure Laterality Date  . Abdominal hysterectomy    . Lynx bladder sling  09/2005    Dr. Jeffie Pollock  . Cholecystectomy  06/20/2012    Procedure: LAPAROSCOPIC CHOLECYSTECTOMY;  Surgeon: Zenovia Jarred, MD;  Location: [place];  Service: General;  Laterality: N/A;   Current Outpatient Prescriptions on File Prior to Visit  Medication Sig Dispense Refill  . acetaminophen (TYLENOL) 325 MG tablet Take 2 tablets (650 mg total) by mouth every 6 (six) hours as needed for pain.    [name] acyclovir (ZOVIRAX) 400 MG tablet Take 1 tablet by mouth daily.    [name] atorvastatin (LIPITOR) 40 MG tablet Take 1 tablet (40 mg total) by mouth daily. 90 tablet 3  . BYSTOLIC 5 MG tablet TAKE 1 TABLET BY MOUTH EVERY DAY 90 tablet 3  . citalopram (CELEXA) 20 MG tablet Take 1 tablet (20 mg total) by mouth daily. 90 tablet 3  .  hydrochlorothiazide (HYDRODIURIL) 25 MG tablet Take 1 tablet (25 mg total) by mouth daily. 90 tablet 3  . levothyroxine (SYNTHROID, LEVOTHROID) 50 MCG tablet TAKE 1 TABLET EVERY DAY 90 tablet 3  . pantoprazole (PROTONIX) 40 MG tablet Take 1 tablet by mouth two  times daily 180 tablet 3  . potassium chloride (K-DUR) 10 MEQ tablet Take 1 tablet (10 mEq total) by mouth 2 (two) times daily. 180 tablet 3   No current facility-administered medications on file prior to visit.   No Known Allergies Social History   Social History  . Marital Status: Divorced    Spouse Name: N/A  . Number of Children: N/A  . Years of Education: N/A   Occupational History  . Not on file.   Social History Main Topics  . Smoking status: Never Smoker   . Smokeless tobacco: Never Used  . Alcohol Use: No  . Drug Use: No  . Sexual Activity: Not on file   Other Topics Concern  . Not on file   Social History Narrative    Past Medical History  Diagnosis Date  . 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 Renal insufficiency 06/19/2012  . Anxiety     hx of panic attack  . Arthritis     hands & knees  . Hypercholesteremia   . Hypertension   . GERD (gastroesophageal reflux  disease)   . Hypothyroidism    Past Surgical History  Procedure Laterality Date  . Abdominal hysterectomy    . Lynx bladder sling  09/2005    Dr. Jeffie Pollock  . Cholecystectomy  06/20/2012    Procedure: LAPAROSCOPIC CHOLECYSTECTOMY;  Surgeon: Zenovia Jarred, MD;  Location: [facility];  Service: General;  Laterality: N/A;   Current Outpatient Prescriptions on File Prior to Visit  Medication Sig Dispense Refill  . acetaminophen (TYLENOL) 325 MG tablet Take 2 tablets (650 mg total) by mouth every 6 (six) hours as needed for pain.    [name] acyclovir (ZOVIRAX) 400 MG tablet Take 1 tablet by mouth daily.    [name] atorvastatin (LIPITOR) 40 MG tablet Take 1 tablet (40 mg total) by mouth daily. 90 tablet 3  . BYSTOLIC 5 MG tablet TAKE 1 TABLET BY MOUTH EVERY DAY  90 tablet 3  . citalopram (CELEXA) 20 MG tablet Take 1 tablet (20 mg total) by mouth daily. 90 tablet 3  . hydrochlorothiazide (HYDRODIURIL) 25 MG tablet Take 1 tablet (25 mg total) by mouth daily. 90 tablet 3  . levothyroxine (SYNTHROID, LEVOTHROID) 50 MCG tablet TAKE 1 TABLET EVERY DAY 90 tablet 3  . pantoprazole (PROTONIX) 40 MG tablet Take 1 tablet by mouth two  times daily 180 tablet 3  . potassium chloride (K-DUR) 10 MEQ tablet Take 1 tablet (10 mEq total) by mouth 2 (two) times daily. 180 tablet 3   No current facility-administered medications on file prior to visit.   No Known Allergies Social History   Social History  . Marital Status: Divorced    Spouse Name: N/A  . Number of Children: N/A  . Years of Education: N/A   Occupational History  . Not on file.   Social History Main Topics  . Smoking status: Never Smoker   . Smokeless tobacco: Never Used  . Alcohol Use: No  . Drug Use: No  . Sexual Activity: Not on file   Other Topics Concern  . Not on file   Social History Narrative      Review of Systems  All other systems reviewed and are negative.      Objective:   Physical Exam  Constitutional: She appears well-developed and well-nourished.  Cardiovascular: Normal rate, regular rhythm and normal heart sounds.   No murmur heard. Pulmonary/Chest: Effort normal and breath sounds normal. No respiratory distress. She has no wheezes. She has no rales.  Neurological: She has normal reflexes. She displays normal reflexes. No cranial nerve deficit. She exhibits normal muscle tone. Coordination normal.  Skin: No rash noted. No erythema.  Vitals reviewed.         Assessment & Plan:  Neuropathy, sacral plexus - Plan: predniSONE (DELTASONE) 20 MG tablet, gabapentin (NEURONTIN) 300 MG capsule  Patient symptoms sounds neuropathic in nature. I do not believe it is pinworms. The burning stinging pain and itching versus her entire buttocks.  Rather I believe the  patient may have some type of sacral plexus radiculopathy. Begin prednisone taper pack in addition to gabapentin 300 mg by mouth 3 times a day. It is possible she may even have an element of postherpetic neuralgia in that area. If symptoms do not improve, I would proceed with an MRI of the lumbosacral spine.

## 2015-06-17 ENCOUNTER — Ambulatory Visit (INDEPENDENT_AMBULATORY_CARE_PROVIDER_SITE_OTHER): Payer: Medicare Other | Admitting: Orthopaedic Surgery

## 2015-06-17 ENCOUNTER — Encounter (INDEPENDENT_AMBULATORY_CARE_PROVIDER_SITE_OTHER): Payer: Self-pay | Admitting: Orthopaedic Surgery

## 2015-06-17 VITALS — BP 122/74 | HR 96 | Ht 62.0 in | Wt 169.0 lb

## 2015-06-17 DIAGNOSIS — M2391 Unspecified internal derangement of right knee: Secondary | ICD-10-CM

## 2015-06-17 DIAGNOSIS — S83249D Other tear of medial meniscus, current injury, unspecified knee, subsequent encounter: Secondary | ICD-10-CM

## 2015-06-17 NOTE — Progress Notes (Signed)
CHIEF COMPLAINT:  Followup status post left knee arthroscopy, but with new right knee pain.     HISTORY OF PRESENT ILLNESS:  Helen [name] comes back.  The right knee is really acting up on her.  The  left knee is doing very well.  She is quite pleased with it.  She is not  having any of the mechanical symptoms she was before surgery.  The right  knee, however, is really bothering her.  It has been bothering her for some  time.  In fact, last time we did give her a cortisone injection into the  right knee, and she has done some exercises and therapy for it, but it  persists in hurting her and giving way and causing her marked medial-sided  knee pain.  She has had x-rays that showed very mild degenerative joint  disease in this knee.     PHYSICAL EXAMINATION:  The knee does show some pain with knee range of motion.  Marked tenderness  to palpation over the medial joint line.  Positive McMurray's.  Stable  ligamentous exam.  She is neurovascularly intact in the affected extremity.   She has full painless range of motion of the ipsilateral hip and ankle.   Left knee exam shows portals well healed.  Full painless range of motion of  the knee.     IMPRESSION AND PLAN:  A 69 year old female status post left knee arthroscopy with partial medial  meniscectomy, doing quite well.  She seems to have a medial meniscus tear  on the right knee, and I would like to obtain an MRI at this point to  evaluate this for further treatment considerations. She may hold off on  therapy in the meantime and continue to take anti-inflammatories.

## 2015-06-21 DIAGNOSIS — D485 Neoplasm of uncertain behavior of skin: Secondary | ICD-10-CM | POA: Diagnosis not present

## 2015-06-21 DIAGNOSIS — X32XXXA Exposure to sunlight, initial encounter: Secondary | ICD-10-CM | POA: Diagnosis not present

## 2015-06-21 DIAGNOSIS — D225 Melanocytic nevi of trunk: Secondary | ICD-10-CM | POA: Diagnosis not present

## 2015-06-21 DIAGNOSIS — L570 Actinic keratosis: Secondary | ICD-10-CM | POA: Diagnosis not present

## 2015-06-21 DIAGNOSIS — L304 Erythema intertrigo: Secondary | ICD-10-CM | POA: Diagnosis not present

## 2015-06-21 DIAGNOSIS — Z1283 Encounter for screening for malignant neoplasm of skin: Secondary | ICD-10-CM | POA: Diagnosis not present

## 2015-06-23 ENCOUNTER — Encounter: Payer: Self-pay | Admitting: Gastroenterology

## 2015-06-27 ENCOUNTER — Encounter: Payer: Self-pay | Admitting: Family Medicine

## 2015-06-27 ENCOUNTER — Ambulatory Visit (INDEPENDENT_AMBULATORY_CARE_PROVIDER_SITE_OTHER): Payer: Medicare Other | Admitting: Family Medicine

## 2015-06-27 VITALS — BP 118/64 | HR 60 | Temp 98.3°F | Resp 16 | Ht 67.5 in | Wt 184.0 lb

## 2015-06-27 DIAGNOSIS — M25559 Pain in unspecified hip: Secondary | ICD-10-CM

## 2015-06-27 DIAGNOSIS — Z23 Encounter for immunization: Secondary | ICD-10-CM

## 2015-06-27 DIAGNOSIS — E038 Other specified hypothyroidism: Secondary | ICD-10-CM

## 2015-06-27 DIAGNOSIS — M26609 Unspecified temporomandibular joint disorder, unspecified side: Secondary | ICD-10-CM | POA: Diagnosis not present

## 2015-06-27 DIAGNOSIS — I10 Essential (primary) hypertension: Secondary | ICD-10-CM

## 2015-06-27 MED ORDER — DIAZEPAM 5 MG PO TABS: 5.0000 mg | ORAL_TABLET | Freq: Every day | ORAL | Status: DC

## 2015-06-27 NOTE — Addendum Note (Signed)
Addended by: Shary Decamp B on: 06/27/2015 04:22 PM   Modules accepted: Orders

## 2015-06-27 NOTE — Progress Notes (Signed)
Subjective:    Patient ID: April Beck, female    DOB: [birth year], 69 y.o.   MRN: [id number]  HPI 05/24/15 Patient reports 2 months of burning pain around her rectum and her sacrum and her gluteus muscles. On examination today there is no visible rash on her gluteus, in between the gluteal cleft. Patient describes it as bee stings and pins and needles pain all around her sacrum and under the skin covering her gluteus. It is worse with sitting. She is unable to lay on her back at night because it exacerbates it. She denies any weakness or pain radiating into her legs. She denies any history of shingles in that area although her symptoms sound like a postherpetic neuralgia. She did have low back pain approximately 2 months ago that was given prednisone and the symptoms went away temporarily.At that time, my plan was: Patient symptoms sounds neuropathic in nature. I do not believe it is pinworms. The burning stinging pain and itching versus her entire buttocks.  Rather I believe the patient may have some type of sacral plexus radiculopathy. Begin prednisone taper pack in addition to gabapentin 300 mg by mouth 3 times a day. It is possible she may even have an element of postherpetic neuralgia in that area. If symptoms do not improve, I would proceed with an MRI of the lumbosacral spine.  06/27/15 Patient is here today for follow-up. She states that the gabapentin did not help at all. However now the presentation of the pain seems to have changed. She is no longer complaining of burning stinging pain around her coccyx and along her sacrum. Instead she is complaining of tenderness and soreness over the initial tuberosities. It also only occurs when she sits on that area.  I am unable to reproduce the pain with palpation. There are certainly no signs of neuropathic pain today on her encounter or in her history she also complains of TMJ pain in her right temporomandibular joint. She's not taking ibuprofen  or muscle relaxers or wearing a mouthguard at night.  She is also requesting that we obtain her blood work so that she doesn't have to come back in November. She is also complaining of pain in her right shoulder. It is worse with abduction and she has a positive empty can sign. It aches at night. However it is mild in intensity Past Medical History  Diagnosis Date  . 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 Renal insufficiency 06/19/2012  . Anxiety     hx of panic attack  . Arthritis     hands & knees  . Hypercholesteremia   . Hypertension   . GERD (gastroesophageal reflux disease)   . Hypothyroidism    Past Surgical History  Procedure Laterality Date  . Abdominal hysterectomy    . Lynx bladder sling  09/2005    Dr. Jeffie Pollock  . Cholecystectomy  06/20/2012    Procedure: LAPAROSCOPIC CHOLECYSTECTOMY;  Surgeon: Zenovia Jarred, MD;  Location: [facility];  Service: General;  Laterality: N/A;   Current Outpatient Prescriptions on File Prior to Visit  Medication Sig Dispense Refill  . acetaminophen (TYLENOL) 325 MG tablet Take 2 tablets (650 mg total) by mouth every 6 (six) hours as needed for pain.    [name] acyclovir (ZOVIRAX) 400 MG tablet Take 1 tablet by mouth daily.    [name] atorvastatin (LIPITOR) 40 MG tablet Take 1 tablet (40 mg total) by mouth daily. 90 tablet 3  . BYSTOLIC 5 MG tablet TAKE 1 TABLET BY  MOUTH EVERY DAY 90 tablet 3  . citalopram (CELEXA) 20 MG tablet Take 1 tablet (20 mg total) by mouth daily. 90 tablet 3  . gabapentin (NEURONTIN) 300 MG capsule Take 1 capsule (300 mg total) by mouth 3 (three) times daily. 90 capsule 3  . hydrochlorothiazide (HYDRODIURIL) 25 MG tablet Take 1 tablet (25 mg total) by mouth daily. 90 tablet 3  . levothyroxine (SYNTHROID, LEVOTHROID) 50 MCG tablet TAKE 1 TABLET EVERY DAY 90 tablet 3  . pantoprazole (PROTONIX) 40 MG tablet Take 1 tablet by mouth two  times daily 180 tablet 3  . potassium chloride (K-DUR) 10 MEQ tablet Take 1 tablet (10 mEq total) by mouth  2 (two) times daily. 180 tablet 3   No current facility-administered medications on file prior to visit.   No Known Allergies Social History   Social History  . Marital Status: Divorced    Spouse Name: N/A  . Number of Children: N/A  . Years of Education: N/A   Occupational History  . Not on file.   Social History Main Topics  . Smoking status: Never Smoker   . Smokeless tobacco: Never Used  . Alcohol Use: No  . Drug Use: No  . Sexual Activity: Not on file   Other Topics Concern  . Not on file   Social History Narrative    Past Medical History  Diagnosis Date  . 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 Renal insufficiency 06/19/2012  . Anxiety     hx of panic attack  . Arthritis     hands & knees  . Hypercholesteremia   . Hypertension   . GERD (gastroesophageal reflux disease)   . Hypothyroidism    Past Surgical History  Procedure Laterality Date  . Abdominal hysterectomy    . Lynx bladder sling  09/2005    Dr. Jeffie Pollock  . Cholecystectomy  06/20/2012    Procedure: LAPAROSCOPIC CHOLECYSTECTOMY;  Surgeon: Zenovia Jarred, MD;  Location: [facility];  Service: General;  Laterality: N/A;   Current Outpatient Prescriptions on File Prior to Visit  Medication Sig Dispense Refill  . acetaminophen (TYLENOL) 325 MG tablet Take 2 tablets (650 mg total) by mouth every 6 (six) hours as needed for pain.    [name] acyclovir (ZOVIRAX) 400 MG tablet Take 1 tablet by mouth daily.    [name] atorvastatin (LIPITOR) 40 MG tablet Take 1 tablet (40 mg total) by mouth daily. 90 tablet 3  . BYSTOLIC 5 MG tablet TAKE 1 TABLET BY MOUTH EVERY DAY 90 tablet 3  . citalopram (CELEXA) 20 MG tablet Take 1 tablet (20 mg total) by mouth daily. 90 tablet 3  . gabapentin (NEURONTIN) 300 MG capsule Take 1 capsule (300 mg total) by mouth 3 (three) times daily. 90 capsule 3  . hydrochlorothiazide (HYDRODIURIL) 25 MG tablet Take 1 tablet (25 mg total) by mouth daily. 90 tablet 3  . levothyroxine (SYNTHROID, LEVOTHROID)  50 MCG tablet TAKE 1 TABLET EVERY DAY 90 tablet 3  . pantoprazole (PROTONIX) 40 MG tablet Take 1 tablet by mouth two  times daily 180 tablet 3  . potassium chloride (K-DUR) 10 MEQ tablet Take 1 tablet (10 mEq total) by mouth 2 (two) times daily. 180 tablet 3   No current facility-administered medications on file prior to visit.   No Known Allergies Social History   Social History  . Marital Status: Divorced    Spouse Name: N/A  . Number of Children: N/A  . Years of Education: N/A   Occupational  History  . Not on file.   Social History Main Topics  . Smoking status: Never Smoker   . Smokeless tobacco: Never Used  . Alcohol Use: No  . Drug Use: No  . Sexual Activity: Not on file   Other Topics Concern  . Not on file   Social History Narrative      Review of Systems  All other systems reviewed and are negative.      Objective:   Physical Exam  Constitutional: She appears well-developed and well-nourished.  Cardiovascular: Normal rate, regular rhythm and normal heart sounds.   No murmur heard. Pulmonary/Chest: Effort normal and breath sounds normal. No respiratory distress. She has no wheezes. She has no rales.  Neurological: She has normal reflexes. No cranial nerve deficit. She exhibits normal muscle tone. Coordination normal.  Skin: No rash noted. No erythema.  Vitals reviewed.         Assessment & Plan:  Posterior pain of hip, unspecified laterality - Plan: DG HIPS BILAT WITH PELVIS 3-4 VIEWS  TMJ (temporomandibular joint syndrome) - Plan: diazepam (VALIUM) 5 MG tablet  Other specified hypothyroidism - Plan: COMPLETE METABOLIC PANEL WITH GFR, TSH  Benign essential HTN - Plan: COMPLETE METABOLIC PANEL WITH GFR, CBC with Differential/Platelet, Lipid panel  Sounds like the patient is just tender over her initial tuberosities. I recommended that she sit on a cushion. She exhibits only take ibuprofen as needed for pain. I will obtain x-rays of the pelvis to  rule out skeletal lesions in the initial tuberosities but at the present time I see no indication for an MRI of the lumbar spine as there is no component of her history which sounds like neuropathy at the present time. Regarding her TMJ, I recommended I am 5 mg at night, an over-the-counter mouthguard, and ibuprofen 800 mg by mouth 3 times a day for the next 2-3 weeks. I will recheck her fasting lipid panel and her thyroid test regarding her blood pressure and her hypothyroidism. She received her flu shot today.

## 2015-06-28 ENCOUNTER — Encounter: Payer: Self-pay | Admitting: Family Medicine

## 2015-06-28 ENCOUNTER — Telehealth: Payer: Self-pay | Admitting: Family Medicine

## 2015-06-28 LAB — COMPLETE METABOLIC PANEL WITH GFR
ALT: 23 U/L (ref 6–29)
AST: 25 U/L (ref 10–35)
Albumin: 4.2 g/dL (ref 3.6–5.1)
Alkaline Phosphatase: 72 U/L (ref 33–130)
BUN: 22 mg/dL (ref 7–25)
CO2: 27 mmol/L (ref 20–31)
Calcium: 9.6 mg/dL (ref 8.6–10.4)
Chloride: 101 mmol/L (ref 98–110)
Creat: 0.98 mg/dL (ref 0.50–0.99)
GFR, Est African American: 69 mL/min (ref >=60)
GFR, Est Non African American: 59 mL/min — ABNORMAL LOW (ref >=60)
Glucose, Bld: 93 mg/dL (ref 70–99)
Potassium: 3.6 mmol/L (ref 3.5–5.3)
Sodium: 140 mmol/L (ref 135–146)
Total Bilirubin: 0.8 mg/dL (ref 0.2–1.2)
Total Protein: 6.6 g/dL (ref 6.1–8.1)

## 2015-06-28 LAB — CBC WITH DIFFERENTIAL/PLATELET
Basophils Absolute: 0 10*3/uL (ref 0.0–0.1)
Basophils Relative: 0 % (ref 0–1)
Eosinophils Absolute: 0.1 10*3/uL (ref 0.0–0.7)
Eosinophils Relative: 2 % (ref 0–5)
HCT: 39.9 % (ref 36.0–46.0)
Hemoglobin: 13.4 g/dL (ref 12.0–15.0)
Lymphocytes Relative: 42 % (ref 12–46)
Lymphs Abs: 2.5 10*3/uL (ref 0.7–4.0)
MCH: 30.9 pg (ref 26.0–34.0)
MCHC: 33.6 g/dL (ref 30.0–36.0)
MCV: 91.9 fL (ref 78.0–100.0)
MPV: 9.7 fL (ref 8.6–12.4)
Monocytes Absolute: 0.5 10*3/uL (ref 0.1–1.0)
Monocytes Relative: 9 % (ref 3–12)
Neutro Abs: 2.8 10*3/uL (ref 1.7–7.7)
Neutrophils Relative %: 47 % (ref 43–77)
Platelets: 230 10*3/uL (ref 150–400)
RBC: 4.34 MIL/uL (ref 3.87–5.11)
RDW: 12.9 % (ref 11.5–15.5)
WBC: 5.9 10*3/uL (ref 4.0–10.5)

## 2015-06-28 LAB — LIPID PANEL
Cholesterol: 151 mg/dL (ref 125–200)
HDL: 40 mg/dL — ABNORMAL LOW (ref >=46)
LDL Cholesterol: 75 mg/dL (ref <130)
Total CHOL/HDL Ratio: 3.8 Ratio (ref <=5.0)
Triglycerides: 182 mg/dL — ABNORMAL HIGH (ref <150)
VLDL: 36 mg/dL — ABNORMAL HIGH (ref <30)

## 2015-06-28 LAB — TSH: TSH: 2.742 u[IU]/mL (ref 0.350–4.500)

## 2015-06-28 NOTE — Telephone Encounter (Signed)
Patient would like a return call she has a question about her appointment yesterday.

## 2015-06-29 ENCOUNTER — Other Ambulatory Visit: Payer: Self-pay | Admitting: Family Medicine

## 2015-06-30 ENCOUNTER — Telehealth: Payer: Self-pay | Admitting: Family Medicine

## 2015-06-30 NOTE — Telephone Encounter (Signed)
Patient would like you to call her regarding the mouth piece for her tmj  [phone]

## 2015-06-30 NOTE — Telephone Encounter (Signed)
LMTRC

## 2015-07-04 ENCOUNTER — Ambulatory Visit (HOSPITAL_COMMUNITY)
Admission: RE | Admit: 2015-07-04 | Discharge: 2015-07-04 | Disposition: A | Discharge status: Home / Self Care | Payer: Medicare Other | Source: Ambulatory Visit | Attending: Family Medicine | Admitting: Family Medicine

## 2015-07-04 DIAGNOSIS — M25551 Pain in right hip: Secondary | ICD-10-CM | POA: Insufficient documentation

## 2015-07-04 DIAGNOSIS — M25552 Pain in left hip: Secondary | ICD-10-CM | POA: Diagnosis not present

## 2015-07-04 DIAGNOSIS — R202 Paresthesia of skin: Secondary | ICD-10-CM | POA: Insufficient documentation

## 2015-07-04 DIAGNOSIS — M25559 Pain in unspecified hip: Secondary | ICD-10-CM

## 2015-07-13 ENCOUNTER — Ambulatory Visit (INDEPENDENT_AMBULATORY_CARE_PROVIDER_SITE_OTHER): Payer: Medicare Other | Admitting: Orthopaedic Surgery

## 2015-07-13 ENCOUNTER — Encounter (INDEPENDENT_AMBULATORY_CARE_PROVIDER_SITE_OTHER): Payer: Self-pay | Admitting: Orthopaedic Surgery

## 2015-07-13 VITALS — Temp 98.6°F | Ht 62.0 in | Wt 169.0 lb

## 2015-07-13 DIAGNOSIS — M1710 Unilateral primary osteoarthritis, unspecified knee: Secondary | ICD-10-CM

## 2015-07-13 DIAGNOSIS — M1711 Unilateral primary osteoarthritis, right knee: Secondary | ICD-10-CM

## 2015-07-13 MED ORDER — LIDOCAINE HCL 1 % IJ SOLN
4.0000 mL | Freq: Once | INTRAMUSCULAR | Status: AC
Start: 2015-07-13 — End: 2015-07-13
  Administered 2015-07-13: 4 mL via INTRA_ARTICULAR

## 2015-07-13 MED ORDER — TRIAMCINOLONE ACETONIDE 40 MG/ML IJ SUSP
40.0000 mg | Freq: Once | INTRAMUSCULAR | Status: AC
Start: 2015-07-13 — End: 2015-07-13
  Administered 2015-07-13: 40 mg via INTRA_ARTICULAR

## 2015-07-13 NOTE — Progress Notes (Signed)
CHIEF COMPLAINT:  Follow up of the right knee.     HISTORY:  A 69 year old female.  She comes back for her right knee.  She had the MRI  obtained.   had marked pain.  It is aching really bothering her  more than any mechanical symptoms at this point.  It causes her a lot of  difficulties.     PHYSICAL EXAMINATION:  The knee does show mild effusion, pain and crepitus with knee range of  motion, tenderness to palpation of the medial joint line and positive  patellofemoral grind.     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     IMPRESSION AND PLAN:  A 69 year old female.  I think most of her right knee pain symptoms are due  to knee arthritis and this will benefit greatly from a cortisone injection,  physical therapy, and anti-inflammatories at this point.  Should her  symptoms continue to worsen, she may be a candidate for a total knee  arthroplasty.     PROCEDURE:  Right knee is injected 1 mL of Kenalog, 4 mL of lidocaine.  The patient  tolerated this well.

## 2015-07-15 ENCOUNTER — Ambulatory Visit (INDEPENDENT_AMBULATORY_CARE_PROVIDER_SITE_OTHER): Payer: Medicare Other | Admitting: Orthopaedic Surgery

## 2015-07-18 ENCOUNTER — Other Ambulatory Visit (INDEPENDENT_AMBULATORY_CARE_PROVIDER_SITE_OTHER): Payer: Self-pay | Admitting: Orthopaedic Surgery

## 2015-07-19 ENCOUNTER — Other Ambulatory Visit: Payer: Self-pay | Admitting: Family Medicine

## 2015-07-19 ENCOUNTER — Ambulatory Visit: Payer: No Typology Code available for payment source | Admitting: Family Medicine

## 2015-07-20 NOTE — Telephone Encounter (Signed)
Medication refilled per protocol. 

## 2015-07-25 ENCOUNTER — Ambulatory Visit: Payer: Medicare Other | Admitting: Family Medicine

## 2015-08-20 ENCOUNTER — Other Ambulatory Visit: Payer: Self-pay | Admitting: Family Medicine

## 2015-09-01 DIAGNOSIS — J329 Chronic sinusitis, unspecified: Secondary | ICD-10-CM | POA: Diagnosis not present

## 2015-09-07 DIAGNOSIS — N3001 Acute cystitis with hematuria: Secondary | ICD-10-CM | POA: Diagnosis not present

## 2015-09-10 DIAGNOSIS — B373 Candidiasis of vulva and vagina: Secondary | ICD-10-CM | POA: Diagnosis not present

## 2015-09-10 DIAGNOSIS — N3001 Acute cystitis with hematuria: Secondary | ICD-10-CM | POA: Diagnosis not present

## 2015-09-10 DIAGNOSIS — N760 Acute vaginitis: Secondary | ICD-10-CM | POA: Diagnosis not present

## 2015-09-21 ENCOUNTER — Ambulatory Visit (INDEPENDENT_AMBULATORY_CARE_PROVIDER_SITE_OTHER): Payer: Medicare Other | Admitting: Orthopaedic Surgery

## 2015-09-21 VITALS — Ht 62.0 in | Wt 169.0 lb

## 2015-09-21 DIAGNOSIS — M1710 Unilateral primary osteoarthritis, unspecified knee: Secondary | ICD-10-CM

## 2015-09-21 DIAGNOSIS — M1990 Unspecified osteoarthritis, unspecified site: Secondary | ICD-10-CM

## 2015-09-23 NOTE — Progress Notes (Signed)
CHIEF COMPLAINT:  Follow up for the bilateral knees.     HISTORY:  Ms. Boyte comes back.  It has been about 2 months since her arthroscopy  of the knee on the left.  She is really doing considerably better.  The  right knee is bothering her, it aches and hurts her when she walks on it,  underneath the kneecap particularly, but overall she feels that she is  doing a lot better.  She takes an occasional anti-inflammatory.     PHYSICAL EXAMINATION:  Left knee shows the portals are well healed.  There is no effusion.  There  is no joint line tenderness .  Negative patellofemoral grind, stable ligamentous  exam.  Right shoulder shows very minimal effusion and there is some  tenderness to palpation over the medial joint line, mildly positive  patellofemoral grind here.     IMPRESSION AND PLAN:   A 70 year old female status post left knee arthroscopy with known history  of bilateral knee degenerative joint disease.  The left knee seems to be  doing well.  The right knee is bothering her.  We discussed different  treatment options including a cortisone shot.  She would like to hold off  on this for now and I think this is reasonable.  We will have her continue  with her activities as tolerated now and follow up with me as needed for  bilateral knees.

## 2015-10-03 ENCOUNTER — Other Ambulatory Visit: Payer: Self-pay | Admitting: Family Medicine

## 2015-10-03 NOTE — Telephone Encounter (Signed)
Refill appropriate and filled per protocol. 

## 2015-10-14 ENCOUNTER — Ambulatory Visit (INDEPENDENT_AMBULATORY_CARE_PROVIDER_SITE_OTHER): Payer: Medicare Other | Admitting: Orthopaedic Surgery

## 2015-11-11 ENCOUNTER — Encounter (INDEPENDENT_AMBULATORY_CARE_PROVIDER_SITE_OTHER): Payer: Self-pay

## 2015-12-07 ENCOUNTER — Other Ambulatory Visit: Payer: Self-pay | Admitting: Family Medicine

## 2015-12-13 ENCOUNTER — Ambulatory Visit (INDEPENDENT_AMBULATORY_CARE_PROVIDER_SITE_OTHER): Payer: Medicare Other | Admitting: Family Medicine

## 2015-12-13 ENCOUNTER — Encounter: Payer: Self-pay | Admitting: Family Medicine

## 2015-12-13 VITALS — BP 130/72 | HR 78 | Temp 98.7°F | Resp 14 | Ht 68.0 in | Wt 181.0 lb

## 2015-12-13 DIAGNOSIS — N390 Urinary tract infection, site not specified: Secondary | ICD-10-CM

## 2015-12-13 DIAGNOSIS — R81 Glycosuria: Secondary | ICD-10-CM | POA: Diagnosis not present

## 2015-12-13 DIAGNOSIS — E7439 Other disorders of intestinal carbohydrate absorption: Secondary | ICD-10-CM | POA: Diagnosis not present

## 2015-12-13 DIAGNOSIS — R7309 Other abnormal glucose: Secondary | ICD-10-CM | POA: Diagnosis not present

## 2015-12-13 DIAGNOSIS — R300 Dysuria: Secondary | ICD-10-CM | POA: Diagnosis not present

## 2015-12-13 LAB — URINALYSIS, MICROSCOPIC ONLY
Casts: NONE SEEN [LPF]
Crystals: NONE SEEN [HPF]
Yeast: NONE SEEN [HPF]

## 2015-12-13 LAB — URINALYSIS, ROUTINE W REFLEX MICROSCOPIC
Bilirubin Urine: NEGATIVE
Ketones, ur: NEGATIVE
Nitrite: NEGATIVE
Specific Gravity, Urine: 1.01 (ref 1.001–1.035)
pH: 6.5 (ref 5.0–8.0)

## 2015-12-13 MED ORDER — PHENAZOPYRIDINE HCL 100 MG PO TABS: 100.0000 mg | ORAL_TABLET | Freq: Three times a day (TID) | ORAL | Status: DC | PRN

## 2015-12-13 MED ORDER — CEPHALEXIN 500 MG PO CAPS: 500.0000 mg | ORAL_CAPSULE | Freq: Four times a day (QID) | ORAL | Status: DC

## 2015-12-13 NOTE — Progress Notes (Signed)
Patient ID: April Beck, female   DOB: 15-Oct-1945, 70 y.o.   MRN: [id number]    Subjective:    Patient ID: April Beck, female    DOB: 01-19-1946, 70 y.o.   MRN: [id number]  Patient presents for Dysuria She was dysuria at this started last night. It worsened this morning. She typically notes her bladder infection is coming on. She actually recently had some dental work done there in the stages of plan and plan end. She was on clindamycin after that and plan which she recently completed. She's not had any fever but does have some pressure in her lower abdomen only with urination. She denies any back pain.  Her urinalysis showed 2+ glucose she states that she has history of galactose intolerance but this is never affected her way of life. I'll give her previous labs she did not have any elevated glucose at that time. She does have diabetes in her family.    Review Of Systems:  GEN- denies fatigue, fever, weight loss,weakness, recent illness HEENT- denies eye drainage, change in vision, nasal discharge, CVS- denies chest pain, palpitations RESP- denies SOB, cough, wheeze ABD- denies N/V, change in stools, +abd pain GU- + dysuria, denies hematuria, dribbling, incontinence MSK- denies joint pain, muscle aches, injury Neuro- denies headache, dizziness, syncope, seizure activity       Objective:    BP 130/72 mmHg  Pulse 78  Temp(Src) 98.7 F (37.1 C) (Oral)  Resp 14  Ht 5\' 8"  (1.727 m)  Wt 181 lb (82.101 kg)  BMI 27.53 kg/m2 GEN- NAD, alert and oriented x3 HEENT- PERRL, EOMI, non injected sclera, pink conjunctiva, MMM, oropharynx clear Neck- Supple, no thyromegaly, no LAD CVS- RRR, no murmur RESP-CTAB ABD-NABS,soft,TTP suprapubic region, no rebound, no guarding, no CVA tenderness         Assessment & Plan:      Problem List Items Addressed This Visit    None    Visit Diagnoses    UTI (lower urinary tract infection)    -  Primary    Start keflex, culture sent      Relevant Medications    cephALEXin (KEFLEX) 500 MG capsule    phenazopyridine (PYRIDIUM) 100 MG tablet    Other Relevant Orders    Urinalysis, Routine w reflex microscopic (not at [facility]) (Completed)    Urine culture    Glucosuria        2+ glucose, check BMET and A1C     Relevant Orders    Hemoglobin [id number]    Basic metabolic panel    Galactose intolerance        Relevant Orders    Hemoglobin [id number]    Basic metabolic panel       Note: This dictation was prepared with Dragon dictation along with smaller phrase technology. Any transcriptional errors that result from this process are unintentional.

## 2015-12-14 ENCOUNTER — Encounter: Payer: Self-pay | Admitting: Family Medicine

## 2015-12-14 LAB — BASIC METABOLIC PANEL
BUN: 16 mg/dL (ref 7–25)
CO2: 32 mmol/L — ABNORMAL HIGH (ref 20–31)
Calcium: 8.9 mg/dL (ref 8.6–10.4)
Chloride: 101 mmol/L (ref 98–110)
Creat: 1.06 mg/dL — ABNORMAL HIGH (ref 0.50–0.99)
Glucose, Bld: 83 mg/dL (ref 70–99)
Potassium: 4.2 mmol/L (ref 3.5–5.3)
Sodium: 136 mmol/L (ref 135–146)

## 2015-12-14 LAB — HEMOGLOBIN A1C
Hgb A1c MFr Bld: 5.4 % (ref <5.7)
Mean Plasma Glucose: 108 mg/dL

## 2015-12-16 LAB — URINE CULTURE: Colony Count: >=100000

## 2016-01-02 ENCOUNTER — Encounter: Payer: Self-pay | Admitting: Family Medicine

## 2016-01-02 ENCOUNTER — Ambulatory Visit (INDEPENDENT_AMBULATORY_CARE_PROVIDER_SITE_OTHER): Payer: Medicare Other | Admitting: Family Medicine

## 2016-01-02 VITALS — BP 112/78 | HR 76 | Temp 98.4°F | Resp 16 | Ht 67.5 in | Wt 183.0 lb

## 2016-01-02 DIAGNOSIS — I10 Essential (primary) hypertension: Secondary | ICD-10-CM | POA: Diagnosis not present

## 2016-01-02 DIAGNOSIS — E038 Other specified hypothyroidism: Secondary | ICD-10-CM | POA: Diagnosis not present

## 2016-01-02 DIAGNOSIS — E785 Hyperlipidemia, unspecified: Secondary | ICD-10-CM

## 2016-01-02 DIAGNOSIS — Z1159 Encounter for screening for other viral diseases: Secondary | ICD-10-CM

## 2016-01-02 LAB — COMPLETE METABOLIC PANEL WITH GFR
ALT: 29 U/L (ref 6–29)
AST: 27 U/L (ref 10–35)
Albumin: 4 g/dL (ref 3.6–5.1)
Alkaline Phosphatase: 54 U/L (ref 33–130)
BUN: 23 mg/dL (ref 7–25)
CO2: 28 mmol/L (ref 20–31)
Calcium: 9 mg/dL (ref 8.6–10.4)
Chloride: 101 mmol/L (ref 98–110)
Creat: 1.08 mg/dL — ABNORMAL HIGH (ref 0.50–0.99)
GFR, Est African American: 61 mL/min (ref >=60)
GFR, Est Non African American: 53 mL/min — ABNORMAL LOW (ref >=60)
Glucose, Bld: 100 mg/dL — ABNORMAL HIGH (ref 70–99)
Potassium: 4 mmol/L (ref 3.5–5.3)
Sodium: 140 mmol/L (ref 135–146)
Total Bilirubin: 1.1 mg/dL (ref 0.2–1.2)
Total Protein: 6.4 g/dL (ref 6.1–8.1)

## 2016-01-02 LAB — LIPID PANEL
Cholesterol: 161 mg/dL (ref 125–200)
HDL: 45 mg/dL — ABNORMAL LOW (ref >=46)
LDL Cholesterol: 70 mg/dL (ref <130)
Total CHOL/HDL Ratio: 3.6 Ratio (ref <=5.0)
Triglycerides: 229 mg/dL — ABNORMAL HIGH (ref <150)
VLDL: 46 mg/dL — ABNORMAL HIGH (ref <30)

## 2016-01-02 LAB — HEPATITIS C ANTIBODY: HCV Ab: NEGATIVE

## 2016-01-02 LAB — TSH: TSH: 1.15 mIU/L

## 2016-01-02 NOTE — Progress Notes (Signed)
Subjective:    Patient ID: April Beck, female    DOB: [birth year], 70 y.o.   MRN: [id number]  HPI The patient is here today for follow-up of her hypertension, hyperlipidemia, and hypothyroidism. Her blood pressure is excellent at 112/78. She denies any chest pain shortness of breath or dyspnea on exertion. She denies any myalgias or right upper quadrant pain. She does occasionally have fatigue however it is very mild. She denies any hair loss or constipation. She does report some diarrhea but this is been chronic ever since her cholecystectomy. She denies any blood in her stool. Sure he has her mammogram scheduled for this year. Last colonoscopy was in 2010 and is not due again until 2020. Overall she is doing well with no complaints  Past Medical History  Diagnosis Date  . 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 Renal insufficiency 06/19/2012  . Anxiety     hx of panic attack  . Arthritis     hands & knees  . Hypercholesteremia   . Hypertension   . GERD (gastroesophageal reflux disease)   . Hypothyroidism   . Galactose intolerance     Assymptomatic, but glucose in urine   Past Surgical History  Procedure Laterality Date  . Abdominal hysterectomy    . [place] bladder sling  09/2005    Dr. Jeffie Pollock  . Cholecystectomy  06/20/2012    Procedure: LAPAROSCOPIC CHOLECYSTECTOMY;  Surgeon: Zenovia Jarred, MD;  Location: [facility];  Service: General;  Laterality: N/A;   Current Outpatient Prescriptions on File Prior to Visit  Medication Sig Dispense Refill  . acetaminophen (TYLENOL) 325 MG tablet Take 2 tablets (650 mg total) by mouth every 6 (six) hours as needed for pain.    [name] acyclovir (ZOVIRAX) 400 MG tablet Take 1 tablet by mouth daily.    [name] atorvastatin (LIPITOR) 40 MG tablet Take 1 tablet by mouth  daily 90 tablet 3  . BYSTOLIC 5 MG tablet Take 1 tablet by mouth  daily 90 tablet 3  . cephALEXin (KEFLEX) 500 MG capsule Take 1 capsule (500 mg total) by mouth 4 (four) times daily. 20  capsule 0  . citalopram (CELEXA) 20 MG tablet Take 1 tablet by mouth  daily 90 tablet 3  . gabapentin (NEURONTIN) 300 MG capsule Take 1 capsule (300 mg total) by mouth 3 (three) times daily. 90 capsule 3  . hydrochlorothiazide (HYDRODIURIL) 25 MG tablet Take 1 tablet by mouth  daily 90 tablet 2  . levothyroxine (SYNTHROID, LEVOTHROID) 50 MCG tablet Take 1 tablet by mouth  every day 90 tablet 3  . pantoprazole (PROTONIX) 40 MG tablet Take 1 tablet by mouth two  times daily 180 tablet 4  . phenazopyridine (PYRIDIUM) 100 MG tablet Take 1 tablet (100 mg total) by mouth 3 (three) times daily as needed for pain. 10 tablet 0  . potassium chloride (K-DUR) 10 MEQ tablet Take 1 tablet (10 mEq total) by mouth 2 (two) times daily. 180 tablet 3  . potassium chloride (K-DUR,KLOR-CON) 10 MEQ tablet Take 1 tablet by mouth  twice a day 180 tablet 3   No current facility-administered medications on file prior to visit.   No Known Allergies Social History   Social History  . Marital Status: Divorced    Spouse Name: N/A  . Number of Children: N/A  . Years of Education: N/A   Occupational History  . Not on file.   Social History Main Topics  . Smoking status: Never Smoker   . Smokeless  tobacco: Never Used  . Alcohol Use: No  . Drug Use: No  . Sexual Activity: Not on file   Other Topics Concern  . Not on file   Social History Narrative    Review of Systems  All other systems reviewed and are negative.      Objective:   Physical Exam  Constitutional: She appears well-developed and well-nourished.  Neck: No JVD present. No thyromegaly present.  Cardiovascular: Normal rate, regular rhythm and normal heart sounds.   Pulmonary/Chest: Effort normal and breath sounds normal. No respiratory distress. She has no wheezes. She has no rales.  Abdominal: Soft. Bowel sounds are normal. She exhibits no distension. There is no tenderness. There is no rebound and no guarding.  Musculoskeletal: She exhibits  no edema.  Lymphadenopathy:    She has no cervical adenopathy.  Vitals reviewed.         Assessment & Plan:  Other specified hypothyroidism - Plan: TSH  Benign essential HTN - Plan: COMPLETE METABOLIC PANEL WITH GFR, Lipid panel  HLD (hyperlipidemia) - Plan: COMPLETE METABOLIC PANEL WITH GFR, Lipid panel  Need for hepatitis C screening test - Plan: Hepatitis C Ab Reflex HCV RNA, QUANT  I am very happy with her blood pressure. I will make no changes in her blood pressure medication at this time. I will check a fasting lipid panel. Her goal LDL cholesterol is less than 130. I will also check a TSH to make sure that her thyroid replacement is at the appropriate dosage. Cancer screening is up-to-date. She is due for hepatitis C screening and I will perform that today

## 2016-01-03 ENCOUNTER — Encounter: Payer: Self-pay | Admitting: Family Medicine

## 2016-01-25 ENCOUNTER — Ambulatory Visit (INDEPENDENT_AMBULATORY_CARE_PROVIDER_SITE_OTHER): Payer: Medicare Other | Admitting: Orthopaedic Surgery

## 2016-01-27 ENCOUNTER — Encounter: Payer: Self-pay | Admitting: Family Medicine

## 2016-01-27 ENCOUNTER — Ambulatory Visit (INDEPENDENT_AMBULATORY_CARE_PROVIDER_SITE_OTHER): Payer: Medicare Other | Admitting: Family Medicine

## 2016-01-27 VITALS — BP 122/74 | HR 68 | Temp 98.5°F | Resp 16 | Ht 67.5 in | Wt 183.0 lb

## 2016-01-27 DIAGNOSIS — J029 Acute pharyngitis, unspecified: Secondary | ICD-10-CM

## 2016-01-27 LAB — STREP GROUP A AG, W/REFLEX TO CULT: STREGTOCOCCUS GROUP A AG SCREEN: NOT DETECTED

## 2016-01-27 MED ORDER — FIRST-DUKES MOUTHWASH MT SUSP: OROMUCOSAL | Status: DC

## 2016-01-27 MED ORDER — FLUCONAZOLE 150 MG PO TABS: 150.0000 mg | ORAL_TABLET | Freq: Once | ORAL | Status: DC

## 2016-01-27 MED ORDER — AMOXICILLIN 500 MG PO CAPS: 500.0000 mg | ORAL_CAPSULE | Freq: Two times a day (BID) | ORAL | Status: DC

## 2016-01-27 NOTE — Progress Notes (Signed)
Patient ID: April Beck, female   DOB: 09-04-1945, 70 y.o.   MRN: [id number]   Subjective:    Patient ID: April Beck, female    DOB: 12-Jun-1946, 70 y.o.   MRN: [id number]  Patient presents for Illness Patient here with sore throat for the past 2 days she says this started on the right side and the pain gets up to her ear now she feels it on the left side. She has pain when swallowing. She's not had any other recent illness. She has a tickle in her throat would make sure occasionally cough but nothing productive or significance. She has not had any significant sinus drainage more isolated sore throat. She did take some ibuprofen as tried to gargle with Listerine. She does have some history of having recurrent tonsillitis in the past.   She gets yeast infection with antibiotics  Review Of Systems:  GEN- denies fatigue, fever, weight loss,weakness, recent illness HEENT- denies eye drainage, change in vision, nasal discharge, CVS- denies chest pain, palpitations RESP- denies SOB, cough, wheeze ABD- denies N/V, change in stools, abd pain GU- denies dysuria, hematuria, dribbling, incontinence MSK- denies joint pain, muscle aches, injury Neuro- denies headache, dizziness, syncope, seizure activity       Objective:    BP 122/74 mmHg  Pulse 68  Temp(Src) 98.5 F (36.9 C) (Oral)  Resp 16  Ht 5' 7.5" (1.715 m)  Wt 183 lb (83.008 kg)  BMI 28.22 kg/m2  SpO2 98% GEN- NAD, alert and oriented x3 HEENT- PERRL, EOMI, non injected sclera, pink conjunctiva, MMM, oropharynx +injection- bloody discharge on swab TM clear bilat no effusion,  maxillary sinus tenderness, nares clear Neck- Supple, shotty submandibular LAD CVS- RRR, no murmur RESP-CTAB Pulses- Radial 2+         Assessment & Plan:      Problem List Items Addressed This Visit    None    Visit Diagnoses    Acute pharyngitis, unspecified etiology    -  Primary    send for throat culture with bloody discharge noted,  conceern for bacterial infection, start amox, magic mouthwash     Relevant Orders    STREP GROUP A AG, W/REFLEX TO CULT (Completed)       Note: This dictation was prepared with Dragon dictation along with smaller phrase technology. Any transcriptional errors that result from this process are unintentional.

## 2016-01-27 NOTE — Patient Instructions (Signed)
Take antibiotics as prescribed Use magic mouthwash  F/U as needed

## 2016-01-29 LAB — CULTURE, GROUP A STREP: Organism ID, Bacteria: NORMAL

## 2016-02-17 ENCOUNTER — Ambulatory Visit (INDEPENDENT_AMBULATORY_CARE_PROVIDER_SITE_OTHER): Payer: Medicare Other | Admitting: Orthopaedic Surgery

## 2016-02-24 ENCOUNTER — Ambulatory Visit (INDEPENDENT_AMBULATORY_CARE_PROVIDER_SITE_OTHER): Payer: Medicare Other | Admitting: Orthopaedic Surgery

## 2016-02-24 DIAGNOSIS — M778 Other enthesopathies, not elsewhere classified: Secondary | ICD-10-CM

## 2016-02-27 NOTE — Progress Notes (Signed)
CHIEF COMPLAINT:  Left wrist pain.     HISTORY OF PRESENT ILLNESS:  Helen [name] comes in for the left wrist.  We have taken care of the knee.   The wrist has been bothering her for some time, really worse over the past  few weeks.  It is worse when she tries to grip stuff.  It feels deep-seated  and she really points throughout the wrist, more pain is ulnar.     PHYSICAL EXAMINATION:  The wrist today showed good range of motion, mild tenderness to palpation  of the ulnar aspect of the wrist, none radially.  Good range of motion of  the hand and fingers, neurovascularly intact.     IMPRESSION:    A 69 year old female.  She has some ulnar-sided wrist pain.  She will  benefit from conservative treatment with anti-inflammatories and physical  therapy.  I have also given her information for a hand specialist.  If her  symptoms persist after conservative treatment, and she may follow up with  him.

## 2016-02-29 DIAGNOSIS — Z1231 Encounter for screening mammogram for malignant neoplasm of breast: Secondary | ICD-10-CM | POA: Diagnosis not present

## 2016-02-29 DIAGNOSIS — Z01419 Encounter for gynecological examination (general) (routine) without abnormal findings: Secondary | ICD-10-CM | POA: Diagnosis not present

## 2016-02-29 DIAGNOSIS — Z6829 Body mass index (BMI) 29.0-29.9, adult: Secondary | ICD-10-CM | POA: Diagnosis not present

## 2016-03-25 ENCOUNTER — Other Ambulatory Visit: Payer: Self-pay | Admitting: Family Medicine

## 2016-04-17 ENCOUNTER — Other Ambulatory Visit: Payer: Self-pay | Admitting: Family Medicine

## 2016-06-08 ENCOUNTER — Ambulatory Visit (INDEPENDENT_AMBULATORY_CARE_PROVIDER_SITE_OTHER): Payer: Self-pay | Admitting: Cardiovascular Disease

## 2016-06-27 DIAGNOSIS — J329 Chronic sinusitis, unspecified: Secondary | ICD-10-CM | POA: Diagnosis not present

## 2016-06-27 DIAGNOSIS — Z2089 Contact with and (suspected) exposure to other communicable diseases: Secondary | ICD-10-CM | POA: Diagnosis not present

## 2016-07-11 ENCOUNTER — Telehealth: Payer: Self-pay | Admitting: Obstetrics & Gynecology

## 2016-07-11 NOTE — Telephone Encounter (Signed)
Pt called. She has a "boil" on the lips of her vagina.  It comes and goes.  Pt has it now and would like to have Dr. Sharlett Iles look at it.  Appt made for Monday.

## 2016-07-16 ENCOUNTER — Ambulatory Visit: Payer: Medicare Other | Admitting: Obstetrics & Gynecology

## 2016-07-16 ENCOUNTER — Encounter: Payer: Self-pay | Admitting: Obstetrics & Gynecology

## 2016-07-16 VITALS — BP 134/76 | Ht 62.0 in | Wt 177.0 lb

## 2016-07-16 DIAGNOSIS — L723 Sebaceous cyst: Secondary | ICD-10-CM

## 2016-07-16 NOTE — Progress Notes (Signed)
S: Complains of lump on right labia for several months. No pain. Sister has similar problem and was told it was benign by PCP.    O: Pelvic: Right labia with less than 1cm calcified sebaceous cyst. No pain. No evidence of infection.    A/P 70 yo Sebaceous cyst  -counseled on cyst  -if lesion becomes larger or develops pain, recommend I and D.

## 2016-07-25 ENCOUNTER — Other Ambulatory Visit: Payer: Self-pay | Admitting: Family Medicine

## 2016-07-30 NOTE — Telephone Encounter (Signed)
Medication refilled per protocol. 

## 2016-08-07 ENCOUNTER — Encounter: Payer: Self-pay | Admitting: Family Medicine

## 2016-08-07 ENCOUNTER — Ambulatory Visit (INDEPENDENT_AMBULATORY_CARE_PROVIDER_SITE_OTHER): Payer: Medicare Other | Admitting: Family Medicine

## 2016-08-07 VITALS — BP 110/72 | HR 76 | Temp 98.6°F | Resp 16 | Ht 67.5 in | Wt 180.0 lb

## 2016-08-07 DIAGNOSIS — M25561 Pain in right knee: Secondary | ICD-10-CM

## 2016-08-07 DIAGNOSIS — E038 Other specified hypothyroidism: Secondary | ICD-10-CM | POA: Diagnosis not present

## 2016-08-07 DIAGNOSIS — G8929 Other chronic pain: Secondary | ICD-10-CM

## 2016-08-07 DIAGNOSIS — I10 Essential (primary) hypertension: Secondary | ICD-10-CM

## 2016-08-07 DIAGNOSIS — M25562 Pain in left knee: Secondary | ICD-10-CM

## 2016-08-07 DIAGNOSIS — E7800 Pure hypercholesterolemia, unspecified: Secondary | ICD-10-CM | POA: Diagnosis not present

## 2016-08-07 LAB — CBC WITH DIFFERENTIAL/PLATELET
Basophils Absolute: 0 cells/uL (ref 0–200)
Basophils Relative: 0 %
Eosinophils Absolute: 142 cells/uL (ref 15–500)
Eosinophils Relative: 2 %
HCT: 42.9 % (ref 35.0–45.0)
Hemoglobin: 14.1 g/dL (ref 12.0–15.0)
Lymphocytes Relative: 33 %
Lymphs Abs: 2343 cells/uL (ref 850–3900)
MCH: 31.4 pg (ref 27.0–33.0)
MCHC: 32.9 g/dL (ref 32.0–36.0)
MCV: 95.5 fL (ref 80.0–100.0)
MPV: 9.6 fL (ref 7.5–12.5)
Monocytes Absolute: 639 cells/uL (ref 200–950)
Monocytes Relative: 9 %
Neutro Abs: 3976 cells/uL (ref 1500–7800)
Neutrophils Relative %: 56 %
Platelets: 203 10*3/uL (ref 140–400)
RBC: 4.49 MIL/uL (ref 3.80–5.10)
RDW: 12.8 % (ref 11.0–15.0)
WBC: 7.1 10*3/uL (ref 3.8–10.8)

## 2016-08-07 LAB — COMPLETE METABOLIC PANEL WITH GFR
ALT: 24 U/L (ref 6–29)
AST: 29 U/L (ref 10–35)
Albumin: 4.3 g/dL (ref 3.6–5.1)
Alkaline Phosphatase: 50 U/L (ref 33–130)
BUN: 21 mg/dL (ref 7–25)
CO2: 30 mmol/L (ref 20–31)
Calcium: 9.9 mg/dL (ref 8.6–10.4)
Chloride: 102 mmol/L (ref 98–110)
Creat: 1.22 mg/dL — ABNORMAL HIGH (ref 0.60–0.93)
GFR, Est African American: 52 mL/min — ABNORMAL LOW (ref >=60)
GFR, Est Non African American: 45 mL/min — ABNORMAL LOW (ref >=60)
Glucose, Bld: 101 mg/dL — ABNORMAL HIGH (ref 70–99)
Potassium: 4.5 mmol/L (ref 3.5–5.3)
Sodium: 140 mmol/L (ref 135–146)
Total Bilirubin: 1.1 mg/dL (ref 0.2–1.2)
Total Protein: 6.6 g/dL (ref 6.1–8.1)

## 2016-08-07 LAB — LIPID PANEL
Cholesterol: 127 mg/dL (ref <200)
HDL: 50 mg/dL — ABNORMAL LOW (ref >50)
LDL Cholesterol: 50 mg/dL (ref <100)
Total CHOL/HDL Ratio: 2.5 Ratio (ref <5.0)
Triglycerides: 134 mg/dL (ref <150)
VLDL: 27 mg/dL (ref <30)

## 2016-08-07 LAB — TSH: TSH: 1.08 mIU/L

## 2016-08-07 NOTE — Progress Notes (Signed)
Subjective:    Patient ID: April Beck, female    DOB: [birth year], 70 y.o.   MRN: [id number]  HPI Patient is here today for a follow-up of her chronic medical problems. Gynecologist performs her mammograms and her bone density test. These are up-to-date. All of her vaccinations are up-to-date except for her flu shot and her tetanus shot. She elects not to receive the tetanus shot but she does receive the flu shot today. Regarding her blood pressure, it is currently very well controlled. Unfortunately her insurance will no longer pay for her medication and will be cost prohibitive. At the present time she is on Bystolic 5 mg a day. It is working well with no side effects. We had a short discussion and she elects to switch to Toprol-XL 25 mg a day whenever her current prescription runs out. She is due for fasting lipid panel. She denies any myalgias or right upper quadrant abdominal pain.  She does complain of bilateral knee pain. The pain is located over the lateral joint lines bilaterally. It is worse rising from a seated position. She denies any laxity to varus or valgus stress. She has a negative Apley grind test. Symptoms are consistent with arthritis. Past Medical History:  Diagnosis Date  . 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 Anxiety    hx of panic attack  . Arthritis    hands & knees  . Galactose intolerance    Assymptomatic, but glucose in urine  . GERD (gastroesophageal reflux disease)   . Hypercholesteremia   . Hypertension   . Hypothyroidism   . Renal insufficiency 06/19/2012   Past Surgical History:  Procedure Laterality Date  . ABDOMINAL HYSTERECTOMY    . CHOLECYSTECTOMY  06/20/2012   Procedure: LAPAROSCOPIC CHOLECYSTECTOMY;  Surgeon: Zenovia Jarred, MD;  Location: [facility];  Service: General;  Laterality: N/A;  [place] Bladder sling  09/2005   Dr. Jeffie Pollock   Current Outpatient Prescriptions on File Prior to Visit  Medication Sig Dispense Refill  . acetaminophen  (TYLENOL) 325 MG tablet Take 2 tablets (650 mg total) by mouth every 6 (six) hours as needed for pain.    [name] acyclovir (ZOVIRAX) 400 MG tablet Take 1 tablet by mouth daily.    [name] atorvastatin (LIPITOR) 40 MG tablet Take 1 tablet by mouth  daily 90 tablet 1  . BYSTOLIC 5 MG tablet Take 1 tablet by mouth  daily 90 tablet 3  . citalopram (CELEXA) 20 MG tablet Take 1 tablet by mouth  daily 90 tablet 3  . hydrochlorothiazide (HYDRODIURIL) 25 MG tablet TAKE 1 TABLET BY MOUTH  DAILY 90 tablet 1  . levothyroxine (SYNTHROID, LEVOTHROID) 50 MCG tablet Take 1 tablet by mouth  every day 90 tablet 3  . pantoprazole (PROTONIX) 40 MG tablet Take 1 tablet by mouth two  times daily 180 tablet 4  . potassium chloride (K-DUR) 10 MEQ tablet Take 1 tablet (10 mEq total) by mouth 2 (two) times daily. 180 tablet 3   No current facility-administered medications on file prior to visit.    No Known Allergies Social History   Social History  . Marital status: Divorced    Spouse name: N/A  . Number of children: N/A  . Years of education: N/A   Occupational History  . Not on file.   Social History Main Topics  . Smoking status: Never Smoker  . Smokeless tobacco: Never Used  . Alcohol use No  . Drug use: No  . Sexual activity: Not  on file   Other Topics Concern  . Not on file   Social History Narrative  . No narrative on file       Review of Systems  All other systems reviewed and are negative.      Objective:   Physical Exam  Constitutional: She appears well-developed and well-nourished.  Eyes: Conjunctivae and EOM are normal. Pupils are equal, round, and reactive to light.  Neck: Normal range of motion. Neck supple. No JVD present.  Cardiovascular: Normal rate, regular rhythm, normal heart sounds and intact distal pulses.   No murmur heard. Pulmonary/Chest: Effort normal and breath sounds normal. No respiratory distress. She has no wheezes. She has no rales.  Abdominal: Soft. Bowel sounds are  normal. She exhibits no distension. There is no tenderness. There is no rebound and no guarding.  Musculoskeletal: She exhibits no edema.  Lymphadenopathy:    She has no cervical adenopathy.  Vitals reviewed.         Assessment & Plan:  Benign essential HTN - Plan: CBC with Differential/Platelet, COMPLETE METABOLIC PANEL WITH GFR, Lipid panel  Other specified hypothyroidism - Plan: TSH  Chronic pain of both knees  Pure hypercholesterolemia Pressures well controlled today. Her current prescription runs out, due to cost, we will switch the patient to Toprol-XL 25 mg a day. Check fasting lipid panel. Goal LDL cholesterol is less than 130. I would also like to see her triglycerides under 150. I believe the knee pain secondary to osteoarthritis. I recommended taking Aleve over-the-counter. She is already on a proton pump inhibitor so this should help prevent GI side effects. We did discuss the low risk of cardiovascular side effects. I will also check a TSH will the patient is here today. She received her flu shot. She elects not to receive her tetanus shot

## 2016-08-09 ENCOUNTER — Encounter: Payer: Self-pay | Admitting: Family Medicine

## 2016-12-24 ENCOUNTER — Telehealth: Payer: Self-pay | Admitting: Family Medicine

## 2016-12-24 MED ORDER — POTASSIUM CHLORIDE ER 10 MEQ PO TBCR: 10.0000 meq | EXTENDED_RELEASE_TABLET | Freq: Two times a day (BID) | ORAL | 3 refills | Status: DC

## 2016-12-24 NOTE — Telephone Encounter (Signed)
Pt needs refill on potassium

## 2016-12-24 NOTE — Telephone Encounter (Signed)
Medication called/sent to requested pharmacy  

## 2016-12-25 ENCOUNTER — Other Ambulatory Visit: Payer: Self-pay | Admitting: Family Medicine

## 2016-12-25 MED ORDER — POTASSIUM CHLORIDE ER 10 MEQ PO TBCR: 10.0000 meq | EXTENDED_RELEASE_TABLET | Freq: Two times a day (BID) | ORAL | 3 refills | Status: DC

## 2016-12-30 DIAGNOSIS — R319 Hematuria, unspecified: Secondary | ICD-10-CM | POA: Diagnosis not present

## 2016-12-30 DIAGNOSIS — N3001 Acute cystitis with hematuria: Secondary | ICD-10-CM | POA: Diagnosis not present

## 2017-01-04 DIAGNOSIS — N3001 Acute cystitis with hematuria: Secondary | ICD-10-CM | POA: Diagnosis not present

## 2017-01-04 DIAGNOSIS — R319 Hematuria, unspecified: Secondary | ICD-10-CM | POA: Diagnosis not present

## 2017-01-14 ENCOUNTER — Ambulatory Visit (INDEPENDENT_AMBULATORY_CARE_PROVIDER_SITE_OTHER): Payer: Medicare (Managed Care)

## 2017-01-14 ENCOUNTER — Encounter (INDEPENDENT_AMBULATORY_CARE_PROVIDER_SITE_OTHER): Payer: Self-pay | Admitting: Orthopaedic Surgery

## 2017-01-14 ENCOUNTER — Ambulatory Visit (INDEPENDENT_AMBULATORY_CARE_PROVIDER_SITE_OTHER): Payer: Medicare (Managed Care) | Admitting: Orthopaedic Surgery

## 2017-01-14 VITALS — BP 144/72 | HR 87 | Ht 62.0 in | Wt 169.0 lb

## 2017-01-14 DIAGNOSIS — W19XXXA Unspecified fall, initial encounter: Secondary | ICD-10-CM

## 2017-01-14 DIAGNOSIS — M25561 Pain in right knee: Secondary | ICD-10-CM

## 2017-01-14 DIAGNOSIS — S8001XA Contusion of right knee, initial encounter: Secondary | ICD-10-CM

## 2017-01-14 DIAGNOSIS — M1711 Unilateral primary osteoarthritis, right knee: Secondary | ICD-10-CM

## 2017-01-16 NOTE — Progress Notes (Signed)
Chief complaint: Right knee pain    History:  71 year old female presents for continued pain in the right knee.  It is worsened since she has had a few falls in the past couple weeks.  She is noted some swelling.  Pain with walking.  She is taken anti-inflammatories.  The pain is anterior and medial.  She denies any fevers or chills.  She denies any loss of consciousness or syncope associated with the falls.  She denies any pain in any other joints.  Currently.    Physical Examination:  Alert and oriented female in no acute distress afebrile, vital signs stable.  Examination of the bilateral lower extremities reveals she sensory and vascularly intact with 5 out of 5 strength all major muscle groups.  Right knee shows mild effusion over the range of motion, positive patellofemoral grind test.  Palpation over the medial joint line.  No difficulty with straight leg raise test.  Stable ligamentous examination.    Imaging: AP and lateral x-ray were obtained of the right knee.  There unremarkable for any fractures, dislocations.  Moderate degenerative changes are noted.    Impression and plan:  71 year old female with right knee arthritis exacerbation and knee contusion treat her conservatively.  We will give her cortisone injection.  We will begin her on another round of physical therapy.  She will follow me as needed.    Procedure: Right knee was injected with 1 mL of Kenalog and 4 mL of lidocaine in a sterile manner.  The patient tolerated well.

## 2017-01-21 ENCOUNTER — Encounter: Payer: Self-pay | Admitting: Family Medicine

## 2017-01-21 ENCOUNTER — Ambulatory Visit (INDEPENDENT_AMBULATORY_CARE_PROVIDER_SITE_OTHER): Payer: Medicare Other | Admitting: Family Medicine

## 2017-01-21 VITALS — BP 110/68 | HR 64 | Temp 98.3°F | Resp 16 | Ht 67.5 in | Wt 180.0 lb

## 2017-01-21 DIAGNOSIS — R739 Hyperglycemia, unspecified: Secondary | ICD-10-CM | POA: Diagnosis not present

## 2017-01-21 DIAGNOSIS — I10 Essential (primary) hypertension: Secondary | ICD-10-CM | POA: Diagnosis not present

## 2017-01-21 DIAGNOSIS — E039 Hypothyroidism, unspecified: Secondary | ICD-10-CM

## 2017-01-21 MED ORDER — HYDROCHLOROTHIAZIDE 25 MG PO TABS: 25.0000 mg | ORAL_TABLET | Freq: Every day | ORAL | 3 refills | Status: DC

## 2017-01-21 MED ORDER — ATENOLOL 50 MG PO TABS: 50.0000 mg | ORAL_TABLET | Freq: Every day | ORAL | 3 refills | Status: DC

## 2017-01-21 MED ORDER — LEVOTHYROXINE SODIUM 50 MCG PO TABS: 50.0000 ug | ORAL_TABLET | Freq: Every day | ORAL | 3 refills | Status: DC

## 2017-01-21 MED ORDER — ATORVASTATIN CALCIUM 40 MG PO TABS: 40.0000 mg | ORAL_TABLET | Freq: Every day | ORAL | 1 refills | Status: DC

## 2017-01-21 MED ORDER — CITALOPRAM HYDROBROMIDE 20 MG PO TABS: 20.0000 mg | ORAL_TABLET | Freq: Every day | ORAL | 3 refills | Status: DC

## 2017-01-21 MED ORDER — POTASSIUM CHLORIDE ER 10 MEQ PO TBCR: 10.0000 meq | EXTENDED_RELEASE_TABLET | Freq: Two times a day (BID) | ORAL | 3 refills | Status: DC

## 2017-01-21 MED ORDER — PANTOPRAZOLE SODIUM 40 MG PO TBEC
40.0000 mg | DELAYED_RELEASE_TABLET | Freq: Two times a day (BID) | ORAL | 3 refills | Status: DC
Start: 2017-01-21 — End: 2018-01-07

## 2017-01-21 NOTE — Progress Notes (Signed)
Subjective:    Patient ID: April Beck, female    DOB: April 19, 1946, 71 y.o.   MRN: [id number]  HPI Patient is here today for a follow-up of her chronic medical problems.  Regarding her blood pressure, it is currently very well controlled. Unfortunately her insurance will no longer pay for her medication and will be cost prohibitive. At the present time she is on Bystolic 5 mg a day. It is working well with no side effects.She is due for fasting lipid panel. She denies any myalgias or right upper quadrant abdominal pain.  Recently she was seen in [place] and was told that she had elevated levels of sugar in her urine. She would like evaluation for diabetes mellitus. She also has a history of a cholecystectomy. She reports diarrhea after eating and is interested in cholestyramine to control this.   Past Medical History:  Diagnosis Date  . 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 Anxiety    hx of panic attack  . Arthritis    hands & knees  . Galactose intolerance    Assymptomatic, but glucose in urine  . GERD (gastroesophageal reflux disease)   . Hypercholesteremia   . Hypertension   . Hypothyroidism   . Renal insufficiency 06/19/2012   Past Surgical History:  Procedure Laterality Date  . ABDOMINAL HYSTERECTOMY    . CHOLECYSTECTOMY  06/20/2012   Procedure: LAPAROSCOPIC CHOLECYSTECTOMY;  Surgeon: Zenovia Jarred, MD;  Location: [facility];  Service: General;  Laterality: N/A;  [place] Bladder sling  09/2005   Dr. Jeffie Pollock   Current Outpatient Prescriptions on File Prior to Visit  Medication Sig Dispense Refill  . acetaminophen (TYLENOL) 325 MG tablet Take 2 tablets (650 mg total) by mouth every 6 (six) hours as needed for pain.    [name] acyclovir (ZOVIRAX) 400 MG tablet Take 1 tablet by mouth daily.     No current facility-administered medications on file prior to visit.    No Known Allergies Social History   Social History  . Marital status: Divorced    Spouse name: N/A  . Number of  children: N/A  . Years of education: N/A   Occupational History  . Not on file.   Social History Main Topics  . Smoking status: Never Smoker  . Smokeless tobacco: Never Used  . Alcohol use No  . Drug use: No  . Sexual activity: Not on file   Other Topics Concern  . Not on file   Social History Narrative  . No narrative on file       Review of Systems  All other systems reviewed and are negative.      Objective:   Physical Exam  Constitutional: She appears well-developed and well-nourished.  Eyes: Conjunctivae and EOM are normal. Pupils are equal, round, and reactive to light.  Neck: Normal range of motion. Neck supple. No JVD present.  Cardiovascular: Normal rate, regular rhythm, normal heart sounds and intact distal pulses.   No murmur heard. Pulmonary/Chest: Effort normal and breath sounds normal. No respiratory distress. She has no wheezes. She has no rales.  Abdominal: Soft. Bowel sounds are normal. She exhibits no distension. There is no tenderness. There is no rebound and no guarding.  Musculoskeletal: She exhibits no edema.  Lymphadenopathy:    She has no cervical adenopathy.  Vitals reviewed.         Assessment & Plan:  Benign essential HTN - Plan: CBC with Differential/Platelet, COMPLETE METABOLIC PANEL WITH GFR, Lipid panel  Hypothyroidism, unspecified  type - Plan: TSH  Elevated blood sugar - Plan: Hemoglobin A1c Blood pressure is well controlled today. We will discontinue bystolic and replaced with atenolol 50 mg by mouth daily. I will check the patient's lab work including a CBC, CMP, fasting lipid panel. I will also check a TSH. Because of her recent glucosuria, I will check a hemoglobin A1c to evaluate for type 2 diabetes mellitus. If the patient's triglycerides are under good control, we could consider starting the patient on cholestyramine for postprandial diarrhea secondary to her cholecystectomy

## 2017-01-22 ENCOUNTER — Encounter: Payer: Self-pay | Admitting: Family Medicine

## 2017-01-22 LAB — CBC WITH DIFFERENTIAL/PLATELET
Basophils Absolute: 42 cells/uL (ref 0–200)
Basophils Relative: 1 %
Eosinophils Absolute: 168 cells/uL (ref 15–500)
Eosinophils Relative: 4 %
HCT: 39.2 % (ref 35.0–45.0)
Hemoglobin: 12.6 g/dL (ref 12.0–15.0)
Lymphocytes Relative: 41 %
Lymphs Abs: 1722 cells/uL (ref 850–3900)
MCH: 30.5 pg (ref 27.0–33.0)
MCHC: 32.1 g/dL (ref 32.0–36.0)
MCV: 94.9 fL (ref 80.0–100.0)
MPV: 9.7 fL (ref 7.5–12.5)
Monocytes Absolute: 504 cells/uL (ref 200–950)
Monocytes Relative: 12 %
Neutro Abs: 1764 cells/uL (ref 1500–7800)
Neutrophils Relative %: 42 %
Platelets: 203 10*3/uL (ref 140–400)
RBC: 4.13 MIL/uL (ref 3.80–5.10)
RDW: 13.6 % (ref 11.0–15.0)
WBC: 4.2 10*3/uL (ref 3.8–10.8)

## 2017-01-22 LAB — LIPID PANEL
Cholesterol: 124 mg/dL (ref <200)
HDL: 46 mg/dL — ABNORMAL LOW (ref >50)
LDL Cholesterol: 52 mg/dL (ref <100)
Total CHOL/HDL Ratio: 2.7 Ratio (ref <5.0)
Triglycerides: 128 mg/dL (ref <150)
VLDL: 26 mg/dL (ref <30)

## 2017-01-22 LAB — COMPLETE METABOLIC PANEL WITH GFR
ALT: 15 U/L (ref 6–29)
AST: 22 U/L (ref 10–35)
Albumin: 4.2 g/dL (ref 3.6–5.1)
Alkaline Phosphatase: 55 U/L (ref 33–130)
BUN: 23 mg/dL (ref 7–25)
CO2: 29 mmol/L (ref 20–31)
Calcium: 9.2 mg/dL (ref 8.6–10.4)
Chloride: 104 mmol/L (ref 98–110)
Creat: 1.03 mg/dL — ABNORMAL HIGH (ref 0.60–0.93)
GFR, Est African American: 64 mL/min (ref >=60)
GFR, Est Non African American: 55 mL/min — ABNORMAL LOW (ref >=60)
Glucose, Bld: 91 mg/dL (ref 70–99)
Potassium: 3.6 mmol/L (ref 3.5–5.3)
Sodium: 143 mmol/L (ref 135–146)
Total Bilirubin: 0.9 mg/dL (ref 0.2–1.2)
Total Protein: 6.4 g/dL (ref 6.1–8.1)

## 2017-01-22 LAB — TSH: TSH: 1.71 mIU/L

## 2017-01-22 LAB — HEMOGLOBIN A1C
Hgb A1c MFr Bld: 5.1 % (ref <5.7)
Mean Plasma Glucose: 100 mg/dL

## 2017-02-12 ENCOUNTER — Encounter: Payer: Medicare Other | Admitting: Family Medicine

## 2017-03-05 DIAGNOSIS — Z6828 Body mass index (BMI) 28.0-28.9, adult: Secondary | ICD-10-CM | POA: Diagnosis not present

## 2017-03-05 DIAGNOSIS — Z1231 Encounter for screening mammogram for malignant neoplasm of breast: Secondary | ICD-10-CM | POA: Diagnosis not present

## 2017-03-05 DIAGNOSIS — N958 Other specified menopausal and perimenopausal disorders: Secondary | ICD-10-CM | POA: Diagnosis not present

## 2017-03-05 DIAGNOSIS — Z01419 Encounter for gynecological examination (general) (routine) without abnormal findings: Secondary | ICD-10-CM | POA: Diagnosis not present

## 2017-03-05 DIAGNOSIS — M8588 Other specified disorders of bone density and structure, other site: Secondary | ICD-10-CM | POA: Diagnosis not present

## 2017-04-12 ENCOUNTER — Ambulatory Visit (INDEPENDENT_AMBULATORY_CARE_PROVIDER_SITE_OTHER): Payer: Medicare (Managed Care) | Admitting: Orthopaedic Surgery

## 2017-04-12 ENCOUNTER — Encounter (INDEPENDENT_AMBULATORY_CARE_PROVIDER_SITE_OTHER): Payer: Self-pay | Admitting: Orthopaedic Surgery

## 2017-04-25 ENCOUNTER — Ambulatory Visit (INDEPENDENT_AMBULATORY_CARE_PROVIDER_SITE_OTHER): Payer: Self-pay | Admitting: Cardiovascular Disease

## 2017-05-24 ENCOUNTER — Telehealth: Payer: Self-pay | Admitting: Family Medicine

## 2017-05-24 DIAGNOSIS — Z1283 Encounter for screening for malignant neoplasm of skin: Secondary | ICD-10-CM | POA: Diagnosis not present

## 2017-05-24 DIAGNOSIS — D225 Melanocytic nevi of trunk: Secondary | ICD-10-CM | POA: Diagnosis not present

## 2017-05-24 DIAGNOSIS — L820 Inflamed seborrheic keratosis: Secondary | ICD-10-CM | POA: Diagnosis not present

## 2017-05-24 NOTE — Telephone Encounter (Signed)
[phone]  Patient is calling to ask questions about her pnumonia shot

## 2017-05-28 MED ORDER — CHOLESTYRAMINE 4 G PO PACK: 4.0000 g | PACK | Freq: Every day | ORAL | 12 refills | Status: DC

## 2017-05-28 NOTE — Telephone Encounter (Signed)
Pt's questions answered and she would like to try the cholestyramine 4 g qd - rx sent to requested pharmacy.

## 2017-06-03 NOTE — Telephone Encounter (Signed)
Pt called and medication is $70 and you have to mix and drink it and she does not want to do that so Dr. Dennard Schaumann suggested otc imodium and pt is trying that and will call if any other problems.

## 2017-06-20 ENCOUNTER — Ambulatory Visit (INDEPENDENT_AMBULATORY_CARE_PROVIDER_SITE_OTHER): Payer: Medicare Other | Admitting: *Deleted

## 2017-06-20 DIAGNOSIS — S61411A Laceration without foreign body of right hand, initial encounter: Secondary | ICD-10-CM

## 2017-06-20 DIAGNOSIS — Z23 Encounter for immunization: Secondary | ICD-10-CM | POA: Diagnosis not present

## 2017-06-20 MED ORDER — POTASSIUM CHLORIDE 20 MEQ PO PACK
20.0000 meq | PACK | Freq: Every day | ORAL | 90 refills | Status: DC
Start: 2017-06-20 — End: 2017-07-08

## 2017-06-20 MED ORDER — POTASSIUM CHLORIDE ER 10 MEQ PO TBCR: 10.0000 meq | EXTENDED_RELEASE_TABLET | Freq: Two times a day (BID) | ORAL | 1 refills | Status: DC

## 2017-06-20 MED ORDER — POTASSIUM CHLORIDE 20 MEQ PO PACK: 20.0000 meq | PACK | Freq: Every day | ORAL | 90 refills | Status: DC

## 2017-06-20 NOTE — Progress Notes (Signed)
Patient seen in office for laceration of R palm and splinter to L palm.   Assisted to remove splinter from palm.   Administered TDAP and Influenza Vaccination.   Tolerated IM administration well.   Immunization history updated.

## 2017-07-08 ENCOUNTER — Ambulatory Visit (INDEPENDENT_AMBULATORY_CARE_PROVIDER_SITE_OTHER): Payer: Medicare Other | Admitting: Family Medicine

## 2017-07-08 ENCOUNTER — Encounter: Payer: Self-pay | Admitting: Family Medicine

## 2017-07-08 VITALS — BP 122/60 | HR 60 | Temp 98.2°F | Resp 14 | Ht 67.5 in | Wt 180.0 lb

## 2017-07-08 DIAGNOSIS — E7800 Pure hypercholesterolemia, unspecified: Secondary | ICD-10-CM

## 2017-07-08 DIAGNOSIS — E038 Other specified hypothyroidism: Secondary | ICD-10-CM

## 2017-07-08 DIAGNOSIS — I10 Essential (primary) hypertension: Secondary | ICD-10-CM | POA: Diagnosis not present

## 2017-07-08 MED ORDER — ZOSTER VAC RECOMB ADJUVANTED 50 MCG/0.5ML IM SUSR: 0.5000 mL | Freq: Once | INTRAMUSCULAR | 1 refills | Status: AC

## 2017-07-08 NOTE — Progress Notes (Signed)
Subjective:    Patient ID: April Beck, female    DOB: 1945/12/08, 71 y.o.   MRN: [id number]  Medication Refill   01/2017 Patient is here today for a follow-up of her chronic medical problems.  Regarding her blood pressure, it is currently very well controlled. Unfortunately her insurance will no longer pay for her medication and will be cost prohibitive. At the present time she is on Bystolic 5 mg a day. It is working well with no side effects.She is due for fasting lipid panel. She denies any myalgias or right upper quadrant abdominal pain.  Recently she was seen in [place] and was told that she had elevated levels of sugar in her urine. She would like evaluation for diabetes mellitus. She also has a history of a cholecystectomy. She reports diarrhea after eating and is interested in cholestyramine to control this.  At that time, my plan was: Blood pressure is well controlled today. We will discontinue bystolic and replaced with atenolol 50 mg by mouth daily. I will check the patient's lab work including a CBC, CMP, fasting lipid panel. I will also check a TSH. Because of her recent glucosuria, I will check a hemoglobin A1c to evaluate for type 2 diabetes mellitus. If the patient's triglycerides are under good control, we could consider starting the patient on cholestyramine for postprandial diarrhea secondary to her cholecystectomy  07/08/2017 Her blood pressure is excellent at 122/60.  She denies any chest pain, sob, doe.  She is due for tsh to monitor her dose of levothyroxine.  Her flu shot is utd.  Received her mammogram, pap, and dexa at [facility] in 12/2016.  She had her flu shot but is due for DEXA.  Past Medical History:  Diagnosis Date  . 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 Anxiety    hx of panic attack  . Arthritis    hands & knees  . Galactose intolerance    Assymptomatic, but glucose in urine  . GERD (gastroesophageal reflux disease)   . Hypercholesteremia   . Hypertension    . Hypothyroidism   . Renal insufficiency 06/19/2012   Past Surgical History:  Procedure Laterality Date  . ABDOMINAL HYSTERECTOMY    . [place] Bladder sling  09/2005   Dr. Jeffie Pollock   Current Outpatient Medications on File Prior to Visit  Medication Sig Dispense Refill  . acetaminophen (TYLENOL) 325 MG tablet Take 2 tablets (650 mg total) by mouth every 6 (six) hours as needed for pain.    [name] acyclovir (ZOVIRAX) 400 MG tablet Take 1 tablet by mouth daily.    [name] atenolol (TENORMIN) 50 MG tablet Take 1 tablet (50 mg total) by mouth daily. 90 tablet 3  . atorvastatin (LIPITOR) 40 MG tablet Take 1 tablet (40 mg total) by mouth daily. 90 tablet 1  . cholestyramine (QUESTRAN) 4 g packet Take 1 packet (4 g total) by mouth daily. 30 each 12  . citalopram (CELEXA) 20 MG tablet Take 1 tablet (20 mg total) by mouth daily. 90 tablet 3  . hydrochlorothiazide (HYDRODIURIL) 25 MG tablet Take 1 tablet (25 mg total) by mouth daily. 90 tablet 3  . levothyroxine (SYNTHROID, LEVOTHROID) 50 MCG tablet Take 1 tablet (50 mcg total) by mouth daily. 90 tablet 3  . pantoprazole (PROTONIX) 40 MG tablet Take 1 tablet (40 mg total) by mouth 2 (two) times daily. 180 tablet 3  . potassium chloride (KLOR-CON) 20 MEQ packet Take 20 mEq by mouth daily. 1 tablet 90   No  current facility-administered medications on file prior to visit.    No Known Allergies Social History   Socioeconomic History  . Marital status: Divorced    Spouse name: Not on file  . Number of children: Not on file  . Years of education: Not on file  . Highest education level: Not on file  Social Needs  . Financial resource strain: Not on file  . Food insecurity - worry: Not on file  . Food insecurity - inability: Not on file  . Transportation needs - medical: Not on file  . Transportation needs - non-medical: Not on file  Occupational History  . Not on file  Tobacco Use  . Smoking status: Never Smoker  . Smokeless tobacco: Never Used  Substance  and Sexual Activity  . Alcohol use: No  . Drug use: No  . Sexual activity: Not on file  Other Topics Concern  . Not on file  Social History Narrative  . Not on file       Review of Systems  All other systems reviewed and are negative.      Objective:   Physical Exam  Constitutional: She appears well-developed and well-nourished.  Eyes: Conjunctivae and EOM are normal. Pupils are equal, round, and reactive to light.  Neck: Normal range of motion. Neck supple. No JVD present.  Cardiovascular: Normal rate, regular rhythm, normal heart sounds and intact distal pulses.  No murmur heard. Pulmonary/Chest: Effort normal and breath sounds normal. No respiratory distress. She has no wheezes. She has no rales.  Abdominal: Soft. Bowel sounds are normal. She exhibits no distension. There is no tenderness. There is no rebound and no guarding.  Musculoskeletal: She exhibits no edema.  Lymphadenopathy:    She has no cervical adenopathy.  Vitals reviewed.         Assessment & Plan:  Other specified hypothyroidism - Plan: CBC with Differential/Platelet, COMPLETE METABOLIC PANEL WITH GFR, TSH, Lipid panel  Benign essential HTN - Plan: CBC with Differential/Platelet, COMPLETE METABOLIC PANEL WITH GFR, TSH, Lipid panel  Pure hypercholesterolemia - Plan: CBC with Differential/Platelet, COMPLETE METABOLIC PANEL WITH GFR, TSH, Lipid panel  Exam is normal.  Flu shot utd.  Sent rx for shingrix to pharmacy for patient.  Check cbc, cmp, flp, and tsh.  Encouraged regular aerobic exercise and low fat diet rich in fruits and vegetables.  BP is excellent.  ROS is otherwise negative.

## 2017-07-09 ENCOUNTER — Encounter: Payer: Self-pay | Admitting: Family Medicine

## 2017-07-09 LAB — CBC WITH DIFFERENTIAL/PLATELET
Basophils Absolute: 28 cells/uL (ref 0–200)
Basophils Relative: 0.6 %
Eosinophils Absolute: 113 cells/uL (ref 15–500)
Eosinophils Relative: 2.4 %
HCT: 36.3 % (ref 35.0–45.0)
Hemoglobin: 12.9 g/dL (ref 11.7–15.5)
Lymphs Abs: 1748 cells/uL (ref 850–3900)
MCH: 31.9 pg (ref 27.0–33.0)
MCHC: 35.5 g/dL (ref 32.0–36.0)
MCV: 89.9 fL (ref 80.0–100.0)
MPV: 10.2 fL (ref 7.5–12.5)
Monocytes Relative: 7.3 %
Neutro Abs: 2468 cells/uL (ref 1500–7800)
Neutrophils Relative %: 52.5 %
Platelets: 181 10*3/uL (ref 140–400)
RBC: 4.04 10*6/uL (ref 3.80–5.10)
RDW: 11.5 % (ref 11.0–15.0)
Total Lymphocyte: 37.2 %
WBC mixed population: 343 cells/uL (ref 200–950)
WBC: 4.7 10*3/uL (ref 3.8–10.8)

## 2017-07-09 LAB — COMPLETE METABOLIC PANEL WITH GFR
AG Ratio: 1.7 (calc) (ref 1.0–2.5)
ALT: 17 U/L (ref 6–29)
AST: 21 U/L (ref 10–35)
Albumin: 4 g/dL (ref 3.6–5.1)
Alkaline phosphatase (APISO): 56 U/L (ref 33–130)
BUN/Creatinine Ratio: 19 (calc) (ref 6–22)
BUN: 21 mg/dL (ref 7–25)
CO2: 32 mmol/L (ref 20–32)
Calcium: 9 mg/dL (ref 8.6–10.4)
Chloride: 104 mmol/L (ref 98–110)
Creat: 1.09 mg/dL — ABNORMAL HIGH (ref 0.60–0.93)
GFR, Est African American: 60 mL/min/{1.73_m2} (ref >=60)
GFR, Est Non African American: 51 mL/min/{1.73_m2} — ABNORMAL LOW (ref >=60)
Globulin: 2.3 g/dL (calc) (ref 1.9–3.7)
Glucose, Bld: 100 mg/dL — ABNORMAL HIGH (ref 65–99)
Potassium: 4 mmol/L (ref 3.5–5.3)
Sodium: 141 mmol/L (ref 135–146)
Total Bilirubin: 0.9 mg/dL (ref 0.2–1.2)
Total Protein: 6.3 g/dL (ref 6.1–8.1)

## 2017-07-09 LAB — LIPID PANEL
Cholesterol: 148 mg/dL (ref <200)
HDL: 48 mg/dL — ABNORMAL LOW (ref >50)
LDL Cholesterol (Calc): 74 mg/dL (calc)
Non-HDL Cholesterol (Calc): 100 mg/dL (calc) (ref <130)
Total CHOL/HDL Ratio: 3.1 (calc) (ref <5.0)
Triglycerides: 159 mg/dL — ABNORMAL HIGH (ref <150)

## 2017-07-09 LAB — TSH: TSH: 1.44 mIU/L (ref 0.40–4.50)

## 2017-07-11 ENCOUNTER — Other Ambulatory Visit: Payer: Self-pay | Admitting: Family Medicine

## 2017-08-19 ENCOUNTER — Encounter: Payer: Self-pay | Admitting: Family Medicine

## 2017-08-19 ENCOUNTER — Ambulatory Visit (INDEPENDENT_AMBULATORY_CARE_PROVIDER_SITE_OTHER): Payer: Medicare Other | Admitting: Family Medicine

## 2017-08-19 VITALS — BP 108/68 | HR 56 | Temp 98.2°F | Resp 16 | Ht 67.5 in | Wt 178.0 lb

## 2017-08-19 DIAGNOSIS — J069 Acute upper respiratory infection, unspecified: Secondary | ICD-10-CM | POA: Diagnosis not present

## 2017-08-19 MED ORDER — HYDROCOD POLST-CPM POLST ER 10-8 MG/5ML PO SUER: 5.0000 mL | Freq: Two times a day (BID) | ORAL | 0 refills | Status: DC | PRN

## 2017-08-19 NOTE — Progress Notes (Signed)
Subjective:    Patient ID: April Beck, female    DOB: [birth year], 71 y.o.   MRN: [id number]  HPI  Symptoms began approximately 2 weeks ago with rhinorrhea, head congestion, postnasal drip, sore scratchy throat, and nonproductive cough.  Cough keeps her awake at night.  She denies any sinus pain.  She denies any sore throat.  She denies any otalgia.  She denies any fevers or chills.  She denies any sputum.  She denies any shortness of breath or chest pain or pleurisy.  Exam today is unremarkable and reassuring Past Medical History:  Diagnosis Date  . 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 Anxiety    hx of panic attack  . Arthritis    hands & knees  . Galactose intolerance    Assymptomatic, but glucose in urine  . GERD (gastroesophageal reflux disease)   . Hypercholesteremia   . Hypertension   . Hypothyroidism   . Renal insufficiency 06/19/2012   Past Surgical History:  Procedure Laterality Date  . ABDOMINAL HYSTERECTOMY    . CHOLECYSTECTOMY  06/20/2012   Procedure: LAPAROSCOPIC CHOLECYSTECTOMY;  Surgeon: Zenovia Jarred, MD;  Location: [facility];  Service: General;  Laterality: N/A;  [place] Bladder sling  09/2005   Dr. Jeffie Pollock   Current Outpatient Medications on File Prior to Visit  Medication Sig Dispense Refill  . acetaminophen (TYLENOL) 325 MG tablet Take 2 tablets (650 mg total) by mouth every 6 (six) hours as needed for pain.    [name] acyclovir (ZOVIRAX) 400 MG tablet Take 1 tablet by mouth daily.    [name] atenolol (TENORMIN) 50 MG tablet Take 1 tablet (50 mg total) by mouth daily. 90 tablet 3  . atorvastatin (LIPITOR) 40 MG tablet TAKE 1 TABLET DAILY 90 tablet 1  . citalopram (CELEXA) 20 MG tablet Take 1 tablet (20 mg total) by mouth daily. 90 tablet 3  . hydrochlorothiazide (HYDRODIURIL) 25 MG tablet Take 1 tablet (25 mg total) by mouth daily. 90 tablet 3  . levothyroxine (SYNTHROID, LEVOTHROID) 50 MCG tablet Take 1 tablet (50 mcg total) by mouth daily. 90 tablet 3  .  pantoprazole (PROTONIX) 40 MG tablet Take 1 tablet (40 mg total) by mouth 2 (two) times daily. 180 tablet 3  . potassium chloride SA (K-DUR,KLOR-CON) 20 MEQ tablet Take 20 mEq by mouth daily.      No current facility-administered medications on file prior to visit.    No Known Allergies Social History   Socioeconomic History  . Marital status: Divorced    Spouse name: Not on file  . Number of children: Not on file  . Years of education: Not on file  . Highest education level: Not on file  Social Needs  . Financial resource strain: Not on file  . Food insecurity - worry: Not on file  . Food insecurity - inability: Not on file  . Transportation needs - medical: Not on file  . Transportation needs - non-medical: Not on file  Occupational History  . Not on file  Tobacco Use  . Smoking status: Never Smoker  . Smokeless tobacco: Never Used  Substance and Sexual Activity  . Alcohol use: No  . Drug use: No  . Sexual activity: Not on file  Other Topics Concern  . Not on file  Social History Narrative  . Not on file     Review of Systems  All other systems reviewed and are negative.      Objective:   Physical Exam  Constitutional:  She appears well-developed and well-nourished.  HENT:  Right Ear: Tympanic membrane, external ear and ear canal normal.  Left Ear: Tympanic membrane, external ear and ear canal normal.  Nose: Rhinorrhea present. No mucosal edema. Right sinus exhibits no maxillary sinus tenderness and no frontal sinus tenderness. Left sinus exhibits no maxillary sinus tenderness and no frontal sinus tenderness.  Mouth/Throat: Oropharynx is clear and moist. No oropharyngeal exudate.  Neck: Neck supple.  Cardiovascular: Normal rate, regular rhythm and normal heart sounds.  No murmur heard. Pulmonary/Chest: Effort normal and breath sounds normal. No respiratory distress. She has no wheezes.  Lymphadenopathy:    She has no cervical adenopathy.  Vitals  reviewed.         Assessment & Plan:  Viral URI  Patient has a viral upper respiratory infection.  Her exam today is reassuring and normal.  Recommended tincture of time with supportive therapy.  She can use Norel AD 1 tablet every 6 hours as needed for chest congestion, cough, head congestion.  At night, she can take Tussionex 1 teaspoon every 12 hours for cough.  Anticipate symptoms will gradually resolve over the next week.  Recheck immediately should she develop shortness of breath, sinus pain, or fever

## 2017-08-20 ENCOUNTER — Encounter: Payer: Self-pay | Admitting: Family Medicine

## 2017-08-29 ENCOUNTER — Other Ambulatory Visit: Payer: Self-pay

## 2017-08-29 ENCOUNTER — Ambulatory Visit (INDEPENDENT_AMBULATORY_CARE_PROVIDER_SITE_OTHER): Payer: Medicare Other | Admitting: *Deleted

## 2017-08-29 ENCOUNTER — Encounter: Payer: Self-pay | Admitting: *Deleted

## 2017-08-29 DIAGNOSIS — Z23 Encounter for immunization: Secondary | ICD-10-CM

## 2017-08-29 NOTE — Progress Notes (Signed)
Patient seen in office for Zoster Vaccination. [facility] Rx printed and signed.   Tolerated IM administration well. Immunization history updated.

## 2017-10-07 ENCOUNTER — Other Ambulatory Visit: Payer: Self-pay | Admitting: Family Medicine

## 2017-10-07 MED ORDER — ATORVASTATIN CALCIUM 40 MG PO TABS: 40.0000 mg | ORAL_TABLET | Freq: Every day | ORAL | 1 refills | Status: DC

## 2017-12-09 ENCOUNTER — Ambulatory Visit: Payer: Medicare Other

## 2017-12-13 ENCOUNTER — Ambulatory Visit (INDEPENDENT_AMBULATORY_CARE_PROVIDER_SITE_OTHER): Payer: Medicare Other

## 2017-12-13 DIAGNOSIS — Z23 Encounter for immunization: Secondary | ICD-10-CM

## 2017-12-13 NOTE — Progress Notes (Signed)
Patient came in today for shingrix vaccine. Vaccine ran through [facility]. Co-pay was discussed with patient. Patient agreed to amount and signed paper.  Shingrix given in the left deltoid. Patient tolerated well.

## 2018-01-06 ENCOUNTER — Encounter: Payer: Self-pay | Admitting: Family Medicine

## 2018-01-06 ENCOUNTER — Ambulatory Visit (INDEPENDENT_AMBULATORY_CARE_PROVIDER_SITE_OTHER): Payer: Medicare Other | Admitting: Family Medicine

## 2018-01-06 VITALS — BP 108/64 | HR 68 | Temp 98.3°F | Resp 14 | Ht 67.5 in | Wt 176.0 lb

## 2018-01-06 DIAGNOSIS — E7800 Pure hypercholesterolemia, unspecified: Secondary | ICD-10-CM | POA: Diagnosis not present

## 2018-01-06 DIAGNOSIS — E038 Other specified hypothyroidism: Secondary | ICD-10-CM

## 2018-01-06 DIAGNOSIS — I10 Essential (primary) hypertension: Secondary | ICD-10-CM | POA: Diagnosis not present

## 2018-01-06 NOTE — Progress Notes (Signed)
Subjective:    Patient ID: April Beck, female    DOB: [birth year], 72 y.o.   MRN: [id number]  Medication Refill    Patient is here today for a follow-up of her chronic medical problems that include hypothyroidism, essential hypertension, and hld.  Over the last year, the patient has successfully lost almost 10 pounds.  As result her blood pressure is now very good.  She is compliant with her atenolol.  However when she is exercising outside or working in her yard, she feels weak and lightheaded dictating possible hypotension.  She denies any chest pain shortness of breath or dyspnea on exertion.  She does have some mild low back pain roughly the level of L5 which radiates down the gluteal cleft into her tailbone.  The pain is burning in nature and suggest neuropathic pain.  I suspect degenerative disc disease.  Patient states the pain is gone on for more than 6 months.  She denies any fevers or chills or worsening bone pain.  Tylenol usually controls it. Past Medical History:  Diagnosis Date  . 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 Anxiety    hx of panic attack  . Arthritis    hands & knees  . Galactose intolerance    Assymptomatic, but glucose in urine  . GERD (gastroesophageal reflux disease)   . Hypercholesteremia   . Hypertension   . Hypothyroidism   . Renal insufficiency 06/19/2012   Past Surgical History:  Procedure Laterality Date  . ABDOMINAL HYSTERECTOMY    . CHOLECYSTECTOMY  06/20/2012   Procedure: LAPAROSCOPIC CHOLECYSTECTOMY;  Surgeon: Zenovia Jarred, MD;  Location: [facility];  Service: General;  Laterality: N/A;  [place] Bladder sling  09/2005   Dr. Jeffie Pollock   Current Outpatient Medications on File Prior to Visit  Medication Sig Dispense Refill  . acetaminophen (TYLENOL) 325 MG tablet Take 2 tablets (650 mg total) by mouth every 6 (six) hours as needed for pain.    [name] acyclovir (ZOVIRAX) 400 MG tablet Take 1 tablet by mouth daily.    [name] atenolol (TENORMIN) 50 MG tablet  Take 1 tablet (50 mg total) by mouth daily. 90 tablet 3  . atorvastatin (LIPITOR) 40 MG tablet Take 1 tablet (40 mg total) by mouth daily. 90 tablet 1  . chlorpheniramine-HYDROcodone (TUSSIONEX PENNKINETIC ER) 10-8 MG/5ML SUER Take 5 mLs by mouth every 12 (twelve) hours as needed for cough. 140 mL 0  . citalopram (CELEXA) 20 MG tablet Take 1 tablet (20 mg total) by mouth daily. 90 tablet 3  . hydrochlorothiazide (HYDRODIURIL) 25 MG tablet Take 1 tablet (25 mg total) by mouth daily. 90 tablet 3  . levothyroxine (SYNTHROID, LEVOTHROID) 50 MCG tablet Take 1 tablet (50 mcg total) by mouth daily. 90 tablet 3  . pantoprazole (PROTONIX) 40 MG tablet Take 1 tablet (40 mg total) by mouth 2 (two) times daily. 180 tablet 3  . potassium chloride SA (K-DUR,KLOR-CON) 20 MEQ tablet Take 20 mEq by mouth daily.      No current facility-administered medications on file prior to visit.    No Known Allergies Social History   Socioeconomic History  . Marital status: Divorced    Spouse name: Not on file  . Number of children: Not on file  . Years of education: Not on file  . Highest education level: Not on file  Occupational History  . Not on file  Social Needs  . Financial resource strain: Not on file  . Food insecurity:  Worry: Not on file    Inability: Not on file  . Transportation needs:    Medical: Not on file    Non-medical: Not on file  Tobacco Use  . Smoking status: Never Smoker  . Smokeless tobacco: Never Used  Substance and Sexual Activity  . Alcohol use: No  . Drug use: No  . Sexual activity: Not on file  Lifestyle  . Physical activity:    Days per week: Not on file    Minutes per session: Not on file  . Stress: Not on file  Relationships  . Social connections:    Talks on phone: Not on file    Gets together: Not on file    Attends religious service: Not on file    Active member of club or organization: Not on file    Attends meetings of clubs or organizations: Not on file     Relationship status: Not on file  . Intimate partner violence:    Fear of current or ex partner: Not on file    Emotionally abused: Not on file    Physically abused: Not on file    Forced sexual activity: Not on file  Other Topics Concern  . Not on file  Social History Narrative  . Not on file       Review of Systems  All other systems reviewed and are negative.      Objective:   Physical Exam  Constitutional: She is oriented to person, place, and time. She appears well-developed and well-nourished.  Eyes: Pupils are equal, round, and reactive to light. Conjunctivae and EOM are normal.  Neck: Normal range of motion. Neck supple. No JVD present.  Cardiovascular: Normal rate, regular rhythm, normal heart sounds and intact distal pulses.  No murmur heard. Pulmonary/Chest: Effort normal and breath sounds normal. No respiratory distress. She has no wheezes. She has no rales.  Abdominal: Soft. Bowel sounds are normal. She exhibits no distension. There is no tenderness. There is no rebound and no guarding.  Musculoskeletal: She exhibits no edema.       Lumbar back: She exhibits normal range of motion, no tenderness, no bony tenderness, no pain and no spasm.       Back:  Lymphadenopathy:    She has no cervical adenopathy.  Neurological: She is alert and oriented to person, place, and time. No cranial nerve deficit. She exhibits normal muscle tone. Coordination normal.  Vitals reviewed.         Assessment & Plan:  .  Other specified hypothyroidism - Plan: CBC with Differential/Platelet, COMPLETE METABOLIC PANEL WITH GFR, TSH  Benign essential HTN - Plan: CBC with Differential/Platelet, COMPLETE METABOLIC PANEL WITH GFR, Lipid panel  Pure hypercholesterolemia - Plan: CBC with Differential/Platelet, COMPLETE METABOLIC PANEL WITH GFR, Lipid panel  Blood pressure is outstanding.  I have recommended decreasing atenolol to 25 mg a day for 2 weeks and then discontinuing the medicine  altogether.  I would then have the patient monitor her blood pressure closely.  As long as is less than 140/90, I would not resume medication.  Check a TSH to ensure adequate dosage of her levothyroxine.  Check CBC to monitor for anemia and also check CMP to check liver and kidney function.  Monitor fasting lipid panel to monitor her hypercholesterolemia.  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  At the present time it is mild and patient declines any aggressive intervention.  Tylenol as needed seems to  control the pain

## 2018-01-07 ENCOUNTER — Other Ambulatory Visit: Payer: Self-pay | Admitting: Family Medicine

## 2018-01-07 ENCOUNTER — Encounter (INDEPENDENT_AMBULATORY_CARE_PROVIDER_SITE_OTHER): Payer: Self-pay

## 2018-01-07 LAB — LIPID PANEL
Cholesterol: 147 mg/dL (ref <200)
HDL: 45 mg/dL — ABNORMAL LOW (ref >50)
LDL Cholesterol (Calc): 73 mg/dL (calc)
Non-HDL Cholesterol (Calc): 102 mg/dL (calc) (ref <130)
Total CHOL/HDL Ratio: 3.3 (calc) (ref <5.0)
Triglycerides: 193 mg/dL — ABNORMAL HIGH (ref <150)

## 2018-01-07 LAB — CBC WITH DIFFERENTIAL/PLATELET
Basophils Absolute: 20 cells/uL (ref 0–200)
Basophils Relative: 0.4 %
Eosinophils Absolute: 112 cells/uL (ref 15–500)
Eosinophils Relative: 2.2 %
HCT: 38.1 % (ref 35.0–45.0)
Hemoglobin: 13.3 g/dL (ref 11.7–15.5)
Lymphs Abs: 1780 cells/uL (ref 850–3900)
MCH: 31.4 pg (ref 27.0–33.0)
MCHC: 34.9 g/dL (ref 32.0–36.0)
MCV: 90.1 fL (ref 80.0–100.0)
MPV: 10 fL (ref 7.5–12.5)
Monocytes Relative: 9.7 %
Neutro Abs: 2693 cells/uL (ref 1500–7800)
Neutrophils Relative %: 52.8 %
Platelets: 200 10*3/uL (ref 140–400)
RBC: 4.23 10*6/uL (ref 3.80–5.10)
RDW: 11.7 % (ref 11.0–15.0)
Total Lymphocyte: 34.9 %
WBC mixed population: 495 cells/uL (ref 200–950)
WBC: 5.1 10*3/uL (ref 3.8–10.8)

## 2018-01-07 LAB — COMPLETE METABOLIC PANEL WITH GFR
AG Ratio: 1.8 (calc) (ref 1.0–2.5)
ALT: 22 U/L (ref 6–29)
AST: 26 U/L (ref 10–35)
Albumin: 4.2 g/dL (ref 3.6–5.1)
Alkaline phosphatase (APISO): 65 U/L (ref 33–130)
BUN/Creatinine Ratio: 16 (calc) (ref 6–22)
BUN: 17 mg/dL (ref 7–25)
CO2: 32 mmol/L (ref 20–32)
Calcium: 9.1 mg/dL (ref 8.6–10.4)
Chloride: 100 mmol/L (ref 98–110)
Creat: 1.05 mg/dL — ABNORMAL HIGH (ref 0.60–0.93)
GFR, Est African American: 62 mL/min/{1.73_m2} (ref >=60)
GFR, Est Non African American: 53 mL/min/{1.73_m2} — ABNORMAL LOW (ref >=60)
Globulin: 2.3 g/dL (calc) (ref 1.9–3.7)
Glucose, Bld: 92 mg/dL (ref 65–99)
Potassium: 4.3 mmol/L (ref 3.5–5.3)
Sodium: 141 mmol/L (ref 135–146)
Total Bilirubin: 0.8 mg/dL (ref 0.2–1.2)
Total Protein: 6.5 g/dL (ref 6.1–8.1)

## 2018-01-07 LAB — TSH: TSH: 1.42 mIU/L (ref 0.40–4.50)

## 2018-01-09 ENCOUNTER — Other Ambulatory Visit: Payer: Self-pay | Admitting: Family Medicine

## 2018-01-09 MED ORDER — POTASSIUM CHLORIDE CRYS ER 20 MEQ PO TBCR: 20.0000 meq | EXTENDED_RELEASE_TABLET | Freq: Every day | ORAL | 3 refills | Status: DC

## 2018-01-31 ENCOUNTER — Ambulatory Visit: Payer: Medicare Other | Admitting: Family Medicine

## 2018-01-31 VITALS — BP 128/60

## 2018-01-31 DIAGNOSIS — I10 Essential (primary) hypertension: Secondary | ICD-10-CM

## 2018-02-24 ENCOUNTER — Other Ambulatory Visit: Payer: Self-pay | Admitting: Family Medicine

## 2018-03-10 DIAGNOSIS — Z1231 Encounter for screening mammogram for malignant neoplasm of breast: Secondary | ICD-10-CM | POA: Diagnosis not present

## 2018-03-10 DIAGNOSIS — Z6828 Body mass index (BMI) 28.0-28.9, adult: Secondary | ICD-10-CM | POA: Diagnosis not present

## 2018-03-10 DIAGNOSIS — Z01419 Encounter for gynecological examination (general) (routine) without abnormal findings: Secondary | ICD-10-CM | POA: Diagnosis not present

## 2018-03-10 LAB — HM MAMMOGRAPHY

## 2018-03-31 ENCOUNTER — Encounter: Payer: Self-pay | Admitting: *Deleted

## 2018-04-28 ENCOUNTER — Ambulatory Visit (INDEPENDENT_AMBULATORY_CARE_PROVIDER_SITE_OTHER): Payer: Self-pay | Admitting: Cardiovascular Disease

## 2018-06-27 ENCOUNTER — Telehealth (HOSPITAL_COMMUNITY): Payer: Self-pay

## 2018-06-27 NOTE — Telephone Encounter (Signed)
Left a message to call [facility] °

## 2018-07-04 ENCOUNTER — Telehealth (HOSPITAL_COMMUNITY): Payer: Self-pay

## 2018-07-04 NOTE — Telephone Encounter (Signed)
Left a message to call [facility] °

## 2018-07-11 ENCOUNTER — Encounter: Payer: Self-pay | Admitting: Family Medicine

## 2018-07-11 ENCOUNTER — Ambulatory Visit (INDEPENDENT_AMBULATORY_CARE_PROVIDER_SITE_OTHER): Payer: Medicare Other | Admitting: Family Medicine

## 2018-07-11 VITALS — BP 108/68 | HR 74 | Temp 98.1°F | Resp 16 | Ht 67.5 in | Wt 174.0 lb

## 2018-07-11 DIAGNOSIS — Z23 Encounter for immunization: Secondary | ICD-10-CM

## 2018-07-11 DIAGNOSIS — Z0000 Encounter for general adult medical examination without abnormal findings: Secondary | ICD-10-CM

## 2018-07-11 DIAGNOSIS — E038 Other specified hypothyroidism: Secondary | ICD-10-CM

## 2018-07-11 DIAGNOSIS — E7800 Pure hypercholesterolemia, unspecified: Secondary | ICD-10-CM

## 2018-07-11 DIAGNOSIS — I10 Essential (primary) hypertension: Secondary | ICD-10-CM

## 2018-07-11 NOTE — Progress Notes (Signed)
Subjective:    Patient ID: April Beck, female    DOB: 15-Aug-1946, 72 y.o.   MRN: [id number]  Patient is here today for complete physical exam.  She has some concerns.  She reports pain in her neck with flexion while getting her hair done or washing her hair.  She has pain at approximately 45 degrees.  There is no crepitus with range of motion in the neck today and there is no palpable soft tissue mass.  She also has varicose veins.  These are mild.  There are several small varicose veins up and down her leg and there are a lot of spider veins on the medial side of her feet and around the medial malleolus bilaterally.  She is not wearing any compression hose.  She also reports diarrhea on occasion when she eats a fatty meal.  This is been present for years after her cholecystectomy.  Her next colonoscopy is due next year.  She sees a gynecologist for her mammogram which was done earlier this year in July was normal along with her Pap smear although she has a history of a hysterectomy.  Her gynecologist to perform her bone density test every 2 years.  She is due today for her flu shot.  The remainder of her immunizations are up-to-date.  Her last colonoscopy was 9 years ago and she is due for that next year. Immunization History  Administered Date(s) Administered  . Influenza Split 06/20/2012  . Influenza, High Dose Seasonal PF 06/20/2017  . Influenza,inj,Quad PF,6+ Mos 06/01/2013, 05/13/2014, 06/27/2015  . Pneumococcal Conjugate-13 10/23/2013  . Pneumococcal Polysaccharide-23 06/20/2012  . Tdap 06/20/2017  . Zoster 08/04/2009  . Zoster Recombinat (Shingrix) 08/29/2017, 12/13/2017     Past Medical History:  Diagnosis Date  . 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 Anxiety    hx of panic attack  . Arthritis    hands & knees  . Galactose intolerance    Assymptomatic, but glucose in urine  . GERD (gastroesophageal reflux disease)   . Hypercholesteremia   . Hypertension   .  Hypothyroidism   . Renal insufficiency 06/19/2012   Past Surgical History:  Procedure Laterality Date  . ABDOMINAL HYSTERECTOMY    . CHOLECYSTECTOMY  06/20/2012   Procedure: LAPAROSCOPIC CHOLECYSTECTOMY;  Surgeon: Zenovia Jarred, MD;  Location: [facility];  Service: General;  Laterality: N/A;  Duane Lope Bladder sling  09/2005   Dr. Jeffie Pollock   Current Outpatient Medications on File Prior to Visit  Medication Sig Dispense Refill  . acetaminophen (TYLENOL) 325 MG tablet Take 2 tablets (650 mg total) by mouth every 6 (six) hours as needed for pain.    [name] acyclovir (ZOVIRAX) 400 MG tablet Take 1 tablet by mouth daily.    [name] atorvastatin (LIPITOR) 40 MG tablet TAKE 1 TABLET DAILY 90 tablet 1  . citalopram (CELEXA) 10 MG tablet Take 10 mg by mouth daily.    . hydrochlorothiazide (HYDRODIURIL) 25 MG tablet TAKE 1 TABLET DAILY 90 tablet 3  . levothyroxine (SYNTHROID, LEVOTHROID) 50 MCG tablet TAKE 1 TABLET DAILY 90 tablet 3  . pantoprazole (PROTONIX) 40 MG tablet TAKE 1 TABLET TWICE A DAY 180 tablet 3  . potassium chloride SA (K-DUR,KLOR-CON) 20 MEQ tablet Take 1 tablet (20 mEq total) by mouth daily. 90 tablet 3   No current facility-administered medications on file prior to visit.    No Known Allergies Social History   Socioeconomic History  . Marital status: Divorced    Spouse name: Not  on file  . Number of children: Not on file  . Years of education: Not on file  . Highest education level: Not on file  Occupational History  . Not on file  Social Needs  . Financial resource strain: Not on file  . Food insecurity:    Worry: Not on file    Inability: Not on file  . Transportation needs:    Medical: Not on file    Non-medical: Not on file  Tobacco Use  . Smoking status: Never Smoker  . Smokeless tobacco: Never Used  Substance and Sexual Activity  . Alcohol use: No  . Drug use: No  . Sexual activity: Not on file  Lifestyle  . Physical activity:    Days per week: Not on file    Minutes  per session: Not on file  . Stress: Not on file  Relationships  . Social connections:    Talks on phone: Not on file    Gets together: Not on file    Attends religious service: Not on file    Active member of club or organization: Not on file    Attends meetings of clubs or organizations: Not on file    Relationship status: Not on file  . Intimate partner violence:    Fear of current or ex partner: Not on file    Emotionally abused: Not on file    Physically abused: Not on file    Forced sexual activity: Not on file  Other Topics Concern  . Not on file  Social History Narrative  . Not on file       Review of Systems  All other systems reviewed and are negative.      Objective:   Physical Exam  Constitutional: She is oriented to person, place, and time. She appears well-developed and well-nourished. No distress.  HENT:  Head: Normocephalic and atraumatic.  Right Ear: External ear normal.  Left Ear: External ear normal.  Nose: Nose normal.  Mouth/Throat: Oropharynx is clear and moist. No oropharyngeal exudate.  Eyes: Pupils are equal, round, and reactive to light. Conjunctivae and EOM are normal. Right eye exhibits no discharge. Left eye exhibits no discharge. No scleral icterus.  Neck: Normal range of motion. Neck supple. No JVD present. No tracheal deviation present. No thyromegaly present.  Cardiovascular: Normal rate, regular rhythm, normal heart sounds and intact distal pulses. Exam reveals no gallop and no friction rub.  No murmur heard. Pulmonary/Chest: Effort normal and breath sounds normal. No stridor. No respiratory distress. She has no wheezes. She has no rales. She exhibits no tenderness.  Abdominal: Soft. Bowel sounds are normal. She exhibits no distension and no mass. There is no tenderness. There is no rebound and no guarding.  Musculoskeletal: She exhibits no edema, tenderness or deformity.  Lymphadenopathy:    She has no cervical adenopathy.    Neurological: She is alert and oriented to person, place, and time. She has normal reflexes. She displays normal reflexes. No cranial nerve deficit. She exhibits normal muscle tone. Coordination normal.  Skin: Skin is warm. No rash noted. She is not diaphoretic. No erythema. No pallor.  Psychiatric: She has a normal mood and affect. Her behavior is normal. Judgment and thought content normal.  Vitals reviewed.         Assessment & Plan:  General medical exam  Benign essential HTN  Other specified hypothyroidism - Plan: TSH  Pure hypercholesterolemia - Plan: CBC with Differential/Platelet, COMPLETE METABOLIC PANEL WITH GFR, Lipid panel  His blood pressure is outstanding at 108/68.  No further changes are needed in her medication.  Patient received her flu shot.  The remainder of her immunizations are up-to-date.  Her Pap smear is not necessary due to her history of hysterectomy.  Her colonoscopy is due next year.  Her mammogram and bone density are performed at her gynecologist and are up-to-date.  Obtain a CBC, CMP, fasting lipid panel, and TSH.  I believe the pain in her neck is secondary to facet arthritis.  If worsening I would proceed with an x-ray of the cervical spine however her pain is mild and only occasional at the present time.  Patient has chronic venous insufficiency in both legs.  I recommended knee-high compression hose 15 to 20 mmHg pressure and wearing them during the day when she is on her feet to help manage and control this.  The remainder of her preventative care is up-to-date.  She denies any problems with falls, depression, or memory loss

## 2018-07-11 NOTE — Addendum Note (Signed)
Addended by: Shary Decamp B on: 07/11/2018 04:35 PM   Modules accepted: Orders

## 2018-07-12 LAB — COMPLETE METABOLIC PANEL WITH GFR
AG Ratio: 2.1 (calc) (ref 1.0–2.5)
ALT: 24 U/L (ref 6–29)
AST: 28 U/L (ref 10–35)
Albumin: 4.4 g/dL (ref 3.6–5.1)
Alkaline phosphatase (APISO): 56 U/L (ref 33–130)
BUN/Creatinine Ratio: 15 (calc) (ref 6–22)
BUN: 16 mg/dL (ref 7–25)
CO2: 30 mmol/L (ref 20–32)
Calcium: 9.5 mg/dL (ref 8.6–10.4)
Chloride: 101 mmol/L (ref 98–110)
Creat: 1.08 mg/dL — ABNORMAL HIGH (ref 0.60–0.93)
GFR, Est African American: 60 mL/min/{1.73_m2} (ref >=60)
GFR, Est Non African American: 52 mL/min/{1.73_m2} — ABNORMAL LOW (ref >=60)
Globulin: 2.1 g/dL (calc) (ref 1.9–3.7)
Glucose, Bld: 92 mg/dL (ref 65–99)
Potassium: 4.2 mmol/L (ref 3.5–5.3)
Sodium: 141 mmol/L (ref 135–146)
Total Bilirubin: 1 mg/dL (ref 0.2–1.2)
Total Protein: 6.5 g/dL (ref 6.1–8.1)

## 2018-07-12 LAB — CBC WITH DIFFERENTIAL/PLATELET
Basophils Absolute: 20 cells/uL (ref 0–200)
Basophils Relative: 0.3 %
Eosinophils Absolute: 79 cells/uL (ref 15–500)
Eosinophils Relative: 1.2 %
HCT: 40.7 % (ref 35.0–45.0)
Hemoglobin: 14.1 g/dL (ref 11.7–15.5)
Lymphs Abs: 2204 cells/uL (ref 850–3900)
MCH: 31.2 pg (ref 27.0–33.0)
MCHC: 34.6 g/dL (ref 32.0–36.0)
MCV: 90 fL (ref 80.0–100.0)
MPV: 10.4 fL (ref 7.5–12.5)
Monocytes Relative: 8.9 %
Neutro Abs: 3709 cells/uL (ref 1500–7800)
Neutrophils Relative %: 56.2 %
Platelets: 223 10*3/uL (ref 140–400)
RBC: 4.52 10*6/uL (ref 3.80–5.10)
RDW: 11.7 % (ref 11.0–15.0)
Total Lymphocyte: 33.4 %
WBC mixed population: 587 cells/uL (ref 200–950)
WBC: 6.6 10*3/uL (ref 3.8–10.8)

## 2018-07-12 LAB — TSH: TSH: 1.46 mIU/L (ref 0.40–4.50)

## 2018-07-12 LAB — LIPID PANEL
Cholesterol: 143 mg/dL (ref <200)
HDL: 43 mg/dL — ABNORMAL LOW (ref >50)
LDL Cholesterol (Calc): 75 mg/dL (calc)
Non-HDL Cholesterol (Calc): 100 mg/dL (calc) (ref <130)
Total CHOL/HDL Ratio: 3.3 (calc) (ref <5.0)
Triglycerides: 146 mg/dL (ref <150)

## 2018-08-05 ENCOUNTER — Ambulatory Visit (INDEPENDENT_AMBULATORY_CARE_PROVIDER_SITE_OTHER): Payer: Medicare Other | Admitting: Family Medicine

## 2018-08-05 ENCOUNTER — Other Ambulatory Visit: Payer: Self-pay

## 2018-08-05 ENCOUNTER — Encounter: Payer: Self-pay | Admitting: Family Medicine

## 2018-08-05 VITALS — BP 124/64 | HR 72 | Temp 98.4°F | Resp 14 | Ht 67.5 in | Wt 177.0 lb

## 2018-08-05 DIAGNOSIS — N898 Other specified noninflammatory disorders of vagina: Secondary | ICD-10-CM

## 2018-08-05 DIAGNOSIS — N764 Abscess of vulva: Secondary | ICD-10-CM

## 2018-08-05 LAB — WET PREP FOR TRICH, YEAST, CLUE

## 2018-08-05 MED ORDER — FLUCONAZOLE 150 MG PO TABS: 150.0000 mg | ORAL_TABLET | Freq: Once | ORAL | 1 refills | Status: AC

## 2018-08-05 MED ORDER — SULFAMETHOXAZOLE-TRIMETHOPRIM 800-160 MG PO TABS: 1.0000 | ORAL_TABLET | Freq: Two times a day (BID) | ORAL | 0 refills | Status: DC

## 2018-08-05 NOTE — Progress Notes (Signed)
   Subjective:    Patient ID: April Beck, female    DOB: November 15, 1945, 72 y.o.   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  She did use peroxide and warm clothes to area No UTI symptoms, no abd pain,n o fever      Review Of Systems:  GEN- denies fatigue, fever, weight loss,weakness, recent illness HEENT- denies eye drainage, change in vision, nasal discharge, CVS- denies chest pain, palpitations RESP- denies SOB, cough, wheeze ABD- denies N/V, change in stools, abd pain GU- denies dysuria, hematuria, dribbling, incontinence MSK- denies joint pain, muscle aches, injury Neuro- denies headache, dizziness, syncope, seizure activity       Objective:    BP 124/64   Pulse 72   Temp 98.4 F (36.9 C) (Oral)   Resp 14   Ht 5' 7.5" (1.715 m)   Wt 177 lb (80.3 kg)   SpO2 96%   BMI 27.31 kg/m  GEN- NAD, alert and oriented x3 HEENT- PERRL, EOMI, non injected sclera, pink conjunctiva, MMM, oropharynx clear, no oral lesions CVS- RRR, no murmur RESP-CTAB ABD-NABS,soft,NT,ND GU- normal external genitalia, vaginal mucosa pink with atrophhy ,mild yellow discharge no bleeding of mucus, Right lower labia- small abscess, erythema with scab, no fluctance, mild induration          Assessment & Plan:      Problem List Items Addressed This Visit    None    Visit Diagnoses    Vaginal odor    -  Primary   no yeast or BV, has more urine smell. However treat labial abscess with bactrim warm compress, I dont think I & D will yeild much. She gets yeast infection after antibiotics so diflucan given   Relevant Orders   WET PREP FOR April Beck, YEAST, CLUE (Completed)   Labial abscess          Note: This dictation was prepared with Dragon dictation along with smaller phrase technology. Any transcriptional errors that result from this process are unintentional.

## 2018-08-05 NOTE — Patient Instructions (Addendum)
Take the bactrim Warm compress F/U as needed

## 2018-08-06 ENCOUNTER — Encounter: Payer: Self-pay | Admitting: Family Medicine

## 2018-09-04 ENCOUNTER — Encounter: Payer: Self-pay | Admitting: Family Medicine

## 2018-09-08 ENCOUNTER — Encounter: Payer: Self-pay | Admitting: Family Medicine

## 2018-09-09 ENCOUNTER — Telehealth: Payer: Self-pay | Admitting: Family Medicine

## 2018-09-09 NOTE — Telephone Encounter (Signed)
I have a new drug plan for 2020. Instead of [employer] my new company is [employer]. Would you please fax a new prescription for all my current medications to this new company.   Their telephone number is [phone]. Their fax number is [phone]. The 64 day mail order pharmacy is still [facility] but [facility] needs their own prescriptions.   Please let them know that I don't need them filled at this time. I just need to get them this information so that when I need to order them they are available.   Would you please respond to this message when this has been done. If you have any questions please call me ASAP. Thank you for your assistance.   [facility]  DOB. [birth year]  Phone. [phone]

## 2018-09-11 ENCOUNTER — Encounter: Payer: Self-pay | Admitting: Family Medicine

## 2018-09-11 MED ORDER — POTASSIUM CHLORIDE CRYS ER 20 MEQ PO TBCR: 20.0000 meq | EXTENDED_RELEASE_TABLET | Freq: Every day | ORAL | 3 refills | Status: DC

## 2018-09-11 MED ORDER — ATORVASTATIN CALCIUM 40 MG PO TABS: 40.0000 mg | ORAL_TABLET | Freq: Every day | ORAL | 3 refills | Status: DC

## 2018-09-11 MED ORDER — CITALOPRAM HYDROBROMIDE 10 MG PO TABS: 10.0000 mg | ORAL_TABLET | Freq: Every day | ORAL | 3 refills | Status: DC

## 2018-09-11 MED ORDER — PANTOPRAZOLE SODIUM 40 MG PO TBEC: 40.0000 mg | DELAYED_RELEASE_TABLET | Freq: Two times a day (BID) | ORAL | 3 refills | Status: DC

## 2018-09-11 MED ORDER — ACYCLOVIR 400 MG PO TABS: 400.0000 mg | ORAL_TABLET | Freq: Every day | ORAL | 3 refills | Status: DC

## 2018-09-11 MED ORDER — HYDROCHLOROTHIAZIDE 25 MG PO TABS: 25.0000 mg | ORAL_TABLET | Freq: Every day | ORAL | 3 refills | Status: DC

## 2018-09-11 MED ORDER — LEVOTHYROXINE SODIUM 50 MCG PO TABS: 50.0000 ug | ORAL_TABLET | Freq: Every day | ORAL | 3 refills | Status: DC

## 2018-09-11 NOTE — Telephone Encounter (Signed)
All meds sent to [facility].

## 2018-09-18 ENCOUNTER — Other Ambulatory Visit: Payer: Self-pay | Admitting: Family Medicine

## 2018-10-28 ENCOUNTER — Ambulatory Visit (INDEPENDENT_AMBULATORY_CARE_PROVIDER_SITE_OTHER): Payer: Medicare Other | Admitting: Family Medicine

## 2018-10-28 ENCOUNTER — Encounter: Payer: Self-pay | Admitting: Family Medicine

## 2018-10-28 VITALS — BP 126/74 | HR 77 | Temp 98.6°F | Resp 16 | Ht 67.5 in | Wt 179.0 lb

## 2018-10-28 DIAGNOSIS — M26609 Unspecified temporomandibular joint disorder, unspecified side: Secondary | ICD-10-CM | POA: Diagnosis not present

## 2018-10-28 MED ORDER — MELOXICAM 15 MG PO TABS: 15.0000 mg | ORAL_TABLET | Freq: Every day | ORAL | 0 refills | Status: DC

## 2018-10-28 MED ORDER — DIAZEPAM 5 MG PO TABS: 5.0000 mg | ORAL_TABLET | Freq: Every evening | ORAL | 1 refills | Status: DC | PRN

## 2018-10-28 NOTE — Progress Notes (Signed)
Subjective:    Patient ID: April Beck, female    DOB: 10/20/1945, 73 y.o.   MRN: [id number]  HPI Patient complains of pain in her left ear.  The pain is actually located anterior to the left ear.  The pain is an aching throbbing pain.  It hurts to chew.  She reports crepitus in the TMJ joint on that side.  She is tried Tylenol with no relief.  The pain has been constant for more than a week.  Patient admits to grinding her teeth at night and she wears a mouthguard however this has not helped.  She also complains of a sore throat.  The sore throat is been present for less than 2 days.  She has a dry nonproductive cough.  She denies any rhinorrhea.  She denies any fevers or chills.  On examination there is no erythema in the posterior oropharynx.  There is no exudate.  There is no lymphadenopathy.  I suspect viral pharyngitis  Past Medical History:  Diagnosis Date  . 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 Anxiety    hx of panic attack  . Arthritis    hands & knees  . Galactose intolerance    Assymptomatic, but glucose in urine  . GERD (gastroesophageal reflux disease)   . Hypercholesteremia   . Hypertension   . Hypothyroidism   . Renal insufficiency 06/19/2012   Past Surgical History:  Procedure Laterality Date  . ABDOMINAL HYSTERECTOMY    . CHOLECYSTECTOMY  06/20/2012   Procedure: LAPAROSCOPIC CHOLECYSTECTOMY;  Surgeon: Zenovia Jarred, MD;  Location: [facility];  Service: General;  Laterality: N/A;  Duane Lope Bladder sling  09/2005   Dr. Jeffie Pollock   Current Outpatient Medications on File Prior to Visit  Medication Sig Dispense Refill  . acetaminophen (TYLENOL) 325 MG tablet Take 2 tablets (650 mg total) by mouth every 6 (six) hours as needed for pain.    [name] acyclovir (ZOVIRAX) 400 MG tablet Take 1 tablet (400 mg total) by mouth daily. 90 tablet 3  . atorvastatin (LIPITOR) 40 MG tablet TAKE 1 TABLET DAILY 90 tablet 1  . citalopram (CELEXA) 10 MG tablet Take 1 tablet (10 mg total)  by mouth daily. 90 tablet 3  . hydrochlorothiazide (HYDRODIURIL) 25 MG tablet Take 1 tablet (25 mg total) by mouth daily. 90 tablet 3  . levothyroxine (SYNTHROID, LEVOTHROID) 50 MCG tablet Take 1 tablet (50 mcg total) by mouth daily. 90 tablet 3  . pantoprazole (PROTONIX) 40 MG tablet Take 1 tablet (40 mg total) by mouth 2 (two) times daily. 180 tablet 3  . potassium chloride SA (K-DUR,KLOR-CON) 20 MEQ tablet Take 1 tablet (20 mEq total) by mouth daily. 90 tablet 3  . sulfamethoxazole-trimethoprim (BACTRIM DS,SEPTRA DS) 800-160 MG tablet Take 1 tablet by mouth 2 (two) times daily. 14 tablet 0   No current facility-administered medications on file prior to visit.    No Known Allergies Social History   Socioeconomic History  . Marital status: Divorced    Spouse name: Not on file  . Number of children: Not on file  . Years of education: Not on file  . Highest education level: Not on file  Occupational History  . Not on file  Social Needs  . Financial resource strain: Not on file  . Food insecurity:    Worry: Not on file    Inability: Not on file  . Transportation needs:    Medical: Not on file    Non-medical: Not on file  Tobacco Use  . Smoking status: Never Smoker  . Smokeless tobacco: Never Used  Substance and Sexual Activity  . Alcohol use: No  . Drug use: No  . Sexual activity: Not on file  Lifestyle  . Physical activity:    Days per week: Not on file    Minutes per session: Not on file  . Stress: Not on file  Relationships  . Social connections:    Talks on phone: Not on file    Gets together: Not on file    Attends religious service: Not on file    Active member of club or organization: Not on file    Attends meetings of clubs or organizations: Not on file    Relationship status: Not on file  . Intimate partner violence:    Fear of current or ex partner: Not on file    Emotionally abused: Not on file    Physically abused: Not on file    Forced sexual activity:  Not on file  Other Topics Concern  . Not on file  Social History Narrative  . Not on file     Review of Systems  All other systems reviewed and are negative.      Objective:   Physical Exam  Constitutional: She appears well-developed and well-nourished.  HENT:  Right Ear: Tympanic membrane, external ear and ear canal normal.  Left Ear: Tympanic membrane, external ear and ear canal normal.  Nose: No mucosal edema or rhinorrhea. Right sinus exhibits no maxillary sinus tenderness and no frontal sinus tenderness. Left sinus exhibits no maxillary sinus tenderness and no frontal sinus tenderness.  Mouth/Throat: Oropharynx is clear and moist. No oropharyngeal exudate.  Neck: Neck supple.  Cardiovascular: Normal rate, regular rhythm and normal heart sounds.  No murmur heard. Pulmonary/Chest: Effort normal and breath sounds normal. No respiratory distress. She has no wheezes.  Lymphadenopathy:    She has no cervical adenopathy.  Vitals reviewed.         Assessment & Plan:  TMJ We will treat the patient with Valium 5 mg p.o. nightly as a muscle relaxer coupled with meloxicam 15 mg p.o. daily x1 week to calm the inflammation in the TMJ joint.  Recommended she continue to wear a mouthguard at night and reassess in 1 week if no better.  Examination of the posterior oropharynx is completely normal.  I suspect the patient may have mild viral pharyngitis.  I have recommended a combination of meloxicam coupled with Chloraseptic and tincture of time.

## 2018-11-05 ENCOUNTER — Other Ambulatory Visit: Payer: Self-pay | Admitting: Family Medicine

## 2018-11-05 MED ORDER — POTASSIUM CHLORIDE CRYS ER 20 MEQ PO TBCR: 20.0000 meq | EXTENDED_RELEASE_TABLET | Freq: Every day | ORAL | 3 refills | Status: DC

## 2018-11-20 ENCOUNTER — Encounter: Payer: Self-pay | Admitting: Gastroenterology

## 2018-11-20 ENCOUNTER — Other Ambulatory Visit: Payer: Self-pay | Admitting: Family Medicine

## 2018-11-20 NOTE — Telephone Encounter (Signed)
Last office visit: 10/28/2018 Last filled: 10/28/2018 for TMJ would you like to refill?

## 2018-11-25 ENCOUNTER — Other Ambulatory Visit: Payer: Self-pay | Admitting: Family Medicine

## 2018-11-25 NOTE — Telephone Encounter (Signed)
Requesting refill    Valium  LOV: 10/28/18  LRF:  10/28/2018

## 2018-11-25 NOTE — Telephone Encounter (Signed)
Pt needs refill on valium to [facility].

## 2018-11-26 MED ORDER — DIAZEPAM 5 MG PO TABS: 5.0000 mg | ORAL_TABLET | Freq: Every evening | ORAL | 1 refills | Status: DC | PRN

## 2018-12-18 ENCOUNTER — Other Ambulatory Visit: Payer: Self-pay | Admitting: Family Medicine

## 2018-12-21 ENCOUNTER — Other Ambulatory Visit: Payer: Self-pay | Admitting: Family Medicine

## 2018-12-22 NOTE — Telephone Encounter (Signed)
Requesting refill    Valium   LOV:10/28/18  LRF:  11/26/18

## 2018-12-23 DIAGNOSIS — H1045 Other chronic allergic conjunctivitis: Secondary | ICD-10-CM | POA: Diagnosis not present

## 2019-01-15 ENCOUNTER — Other Ambulatory Visit: Payer: Self-pay | Admitting: Family Medicine

## 2019-02-05 ENCOUNTER — Encounter: Payer: Self-pay | Admitting: Gastroenterology

## 2019-02-08 ENCOUNTER — Other Ambulatory Visit: Payer: Self-pay | Admitting: Family Medicine

## 2019-03-09 ENCOUNTER — Other Ambulatory Visit: Payer: Self-pay

## 2019-03-09 ENCOUNTER — Ambulatory Visit (AMBULATORY_SURGERY_CENTER): Payer: Medicare Other | Admitting: *Deleted

## 2019-03-09 VITALS — Ht 68.0 in | Wt 175.0 lb

## 2019-03-09 DIAGNOSIS — Z1211 Encounter for screening for malignant neoplasm of colon: Secondary | ICD-10-CM

## 2019-03-09 MED ORDER — PEG 3350-KCL-NA BICARB-NACL 420 G PO SOLR: 4000.0000 mL | Freq: Once | ORAL | 0 refills | Status: AC

## 2019-03-09 NOTE — Progress Notes (Signed)
No egg or soy allergy known to patient  No issues with past sedation with any surgeries  or procedures, no intubation problems  No diet pills per patient No home 02 use per patient  No blood thinners per patient  Pt denies issues with constipation  No A fib or A flutter  EMMI video sent to pt's e mail   Pt verified name, DOB, address and insurance during PV today. Pt mailed instruction packet to included paper to complete and mail back to [facility] with addressed and stamped envelope, Emmi video, copy of consent form to read and not return, and instructions. PV completed over the phone. Pt encouraged to call with questions or issues   Pt is aware that care partner will wait in the car during proceudre; if they feel like they will be too hot to wait in the car; they may wait in the lobby.  We want them to wear a mask (we do not have any that we can provide them), practice social distancing, and we will check their temperatures when they get here.  I did remind patient that their care partner needs to stay in the parking lot the entire time. Pt will wear mask into building.

## 2019-03-10 DIAGNOSIS — L579 Skin changes due to chronic exposure to nonionizing radiation, unspecified: Secondary | ICD-10-CM | POA: Diagnosis not present

## 2019-03-10 DIAGNOSIS — N958 Other specified menopausal and perimenopausal disorders: Secondary | ICD-10-CM | POA: Diagnosis not present

## 2019-03-10 DIAGNOSIS — Z6827 Body mass index (BMI) 27.0-27.9, adult: Secondary | ICD-10-CM | POA: Diagnosis not present

## 2019-03-10 DIAGNOSIS — Z01419 Encounter for gynecological examination (general) (routine) without abnormal findings: Secondary | ICD-10-CM | POA: Diagnosis not present

## 2019-03-10 DIAGNOSIS — M8588 Other specified disorders of bone density and structure, other site: Secondary | ICD-10-CM | POA: Diagnosis not present

## 2019-03-10 DIAGNOSIS — Z1231 Encounter for screening mammogram for malignant neoplasm of breast: Secondary | ICD-10-CM | POA: Diagnosis not present

## 2019-03-10 LAB — HM DEXA SCAN

## 2019-03-19 ENCOUNTER — Telehealth: Payer: Self-pay | Admitting: Gastroenterology

## 2019-03-19 NOTE — Telephone Encounter (Signed)

## 2019-03-19 NOTE — Telephone Encounter (Signed)
Patient call back and answered no to all questions

## 2019-03-20 ENCOUNTER — Encounter: Payer: Self-pay | Admitting: Gastroenterology

## 2019-03-20 ENCOUNTER — Ambulatory Visit (AMBULATORY_SURGERY_CENTER): Payer: Medicare Other | Admitting: Gastroenterology

## 2019-03-20 ENCOUNTER — Other Ambulatory Visit: Payer: Self-pay

## 2019-03-20 VITALS — BP 111/59 | HR 57 | Temp 98.1°F | Resp 11 | Ht 68.0 in | Wt 175.0 lb

## 2019-03-20 DIAGNOSIS — Z1211 Encounter for screening for malignant neoplasm of colon: Secondary | ICD-10-CM | POA: Diagnosis not present

## 2019-03-20 MED ORDER — SODIUM CHLORIDE 0.9 % IV SOLN: 500.0000 mL | Freq: Once | INTRAVENOUS | Status: DC

## 2019-03-20 NOTE — Op Note (Signed)
[name]: April Beck Procedure Date: 03/20/2019 9:29 AM MRN: [id number] Endoscopist: Milus Banister , MD Age: 73 Referring MD:  Date of Birth: Sep 30, 1945 Gender: Female Account #: [id number] Procedure:                Colonoscopy Indications:              Screening for colorectal malignant neoplasm Medicines:                Monitored Anesthesia Care Procedure:                Pre-Anesthesia Assessment:                           - Prior to the procedure, a History and Physical                            was performed, and patient medications and                            allergies were reviewed. The patient's tolerance of                            previous anesthesia was also reviewed. The risks                            and benefits of the procedure and the sedation                            options and risks were discussed with the patient.                            All questions were answered, and informed consent                            was obtained. Prior Anticoagulants: The patient has                            taken no previous anticoagulant or antiplatelet                            agents. ASA Grade Assessment: II - A patient with                            mild systemic disease. After reviewing the risks                            and benefits, the patient was deemed in                            satisfactory condition to undergo the procedure.                           After obtaining informed consent, the colonoscope  was passed under direct vision. Throughout the                            procedure, the patient's blood pressure, pulse, and                            oxygen saturations were monitored continuously. The                            Colonoscope was introduced through the anus and                            advanced to the the cecum, identified by                            appendiceal orifice and  ileocecal valve. The                            colonoscopy was performed without difficulty. The                            patient tolerated the procedure well. The quality                            of the bowel preparation was good. The ileocecal                            valve, appendiceal orifice, and rectum were                            photographed. Scope In: 9:52:23 AM Scope Out: 10:07:48 AM Scope Withdrawal Time: 0 hours 10 minutes 10 seconds  Total Procedure Duration: 0 hours 15 minutes 25 seconds  Findings:                 The entire examined colon appeared normal on direct                            and retroflexion views. Complications:            No immediate complications. Estimated Blood Loss:     Estimated blood loss: none. Impression:               - The entire examined colon is normal on direct and                            retroflexion views.                           - No polyps or cancers. Recommendation:           - Patient has a contact number available for                            emergencies. The signs and symptoms of potential  delayed complications were discussed with the                            patient. Return to normal activities tomorrow.                            Written discharge instructions were provided to the                            patient.                           - Resume previous diet.                           - Continue present medications.                           You do not need any further colon cancer screening                            tests (including stool testing). These types of                            tests generally stop around age 47-80. Milus Banister, MD 03/20/2019 10:10:12 AM This report has been signed electronically.

## 2019-03-20 NOTE — Progress Notes (Signed)
PT taken to PACU. Monitors in place. VSS. Report given to RN. 

## 2019-03-20 NOTE — Progress Notes (Signed)
Pt's states no medical or surgical changes since previsit or office visit. 

## 2019-03-20 NOTE — Patient Instructions (Signed)
Discharge instructions given. Normal exam. Resume previous medications. YOU HAD AN ENDOSCOPIC PROCEDURE TODAY AT THE [facility]:   Refer to the procedure report that was given to you for any specific questions about what was found during the examination.  If the procedure report does not answer your questions, please call your gastroenterologist to clarify.  If you requested that your care partner not be given the details of your procedure findings, then the procedure report has been included in a sealed envelope for you to review at your convenience later.  YOU SHOULD EXPECT: Some feelings of bloating in the abdomen. Passage of more gas than usual.  Walking can help get rid of the air that was put into your GI tract during the procedure and reduce the bloating. If you had a lower endoscopy (such as a colonoscopy or flexible sigmoidoscopy) you may notice spotting of blood in your stool or on the toilet paper. If you underwent a bowel prep for your procedure, you may not have a normal bowel movement for a few days.  Please Note:  You might notice some irritation and congestion in your nose or some drainage.  This is from the oxygen used during your procedure.  There is no need for concern and it should clear up in a day or so.  SYMPTOMS TO REPORT IMMEDIATELY:   Following lower endoscopy (colonoscopy or flexible sigmoidoscopy):  Excessive amounts of blood in the stool  Significant tenderness or worsening of abdominal pains  Swelling of the abdomen that is new, acute  Fever of 100F or higher   For urgent or emergent issues, a gastroenterologist can be reached at any hour by calling [phone].   DIET:  We do recommend a small meal at first, but then you may proceed to your regular diet.  Drink plenty of fluids but you should avoid alcoholic beverages for 24 hours.  ACTIVITY:  You should plan to take it easy for the rest of today and you should NOT DRIVE or use heavy machinery  until tomorrow (because of the sedation medicines used during the test).    FOLLOW UP: Our staff will call the number listed on your records 48-72 hours following your procedure to check on you and address any questions or concerns that you may have regarding the information given to you following your procedure. If we do not reach you, we will leave a message.  We will attempt to reach you two times.  During this call, we will ask if you have developed any symptoms of COVID 19. If you develop any symptoms (ie: fever, flu-like symptoms, shortness of breath, cough etc.) before then, please call [phone].  If you test positive for Covid 19 in the 2 weeks post procedure, please call and report this information to us.    If any biopsies were taken you will be contacted by phone or by letter within the next 1-3 weeks.  Please call us at [phone] if you have not heard about the biopsies in 3 weeks.    SIGNATURES/CONFIDENTIALITY: You and/or your care partner have signed paperwork which will be entered into your electronic medical record.  These signatures attest to the fact that that the information above on your After Visit Summary has been reviewed and is understood.  Full responsibility of the confidentiality of this discharge information lies with you and/or your care-partner. 

## 2019-03-24 ENCOUNTER — Telehealth: Payer: Self-pay

## 2019-03-24 NOTE — Telephone Encounter (Signed)
  Follow up Call-  Call back number 03/20/2019  Post procedure Call Back phone  # [phone]  Permission to leave phone message Yes  Some recent data might be hidden     Patient questions:  Do you have a fever, pain , or abdominal swelling? No. Pain Score  0 *  Have you tolerated food without any problems? Yes.    Have you been able to return to your normal activities? Yes.    Do you have any questions about your discharge instructions: Diet   No. Medications  No. Follow up visit  No.  Do you have questions or concerns about your Care? No.  Actions: * If pain score is 4 or above: No action needed, pain <4.  1. Have you developed a fever since your procedure? Yes, patient said she developed a fever the evening of her procedure around 6:30 pm and took tylenol. She said it broke around 12:30 am and did not come back.   2.   Have you had an respiratory symptoms (SOB or cough) since your procedure? no  3.   Have you tested positive for COVID 19 since your procedure no  4.   Have you had any family members/close contacts diagnosed with the COVID 19 since your procedure?  no   If yes to any of these questions please route to Joylene John, RN and Alphonsa Gin, Therapist, sports.

## 2019-03-27 ENCOUNTER — Encounter: Payer: Self-pay | Admitting: *Deleted

## 2019-03-27 DIAGNOSIS — Z1231 Encounter for screening mammogram for malignant neoplasm of breast: Secondary | ICD-10-CM | POA: Diagnosis not present

## 2019-04-02 ENCOUNTER — Other Ambulatory Visit: Payer: Self-pay

## 2019-04-03 ENCOUNTER — Ambulatory Visit (INDEPENDENT_AMBULATORY_CARE_PROVIDER_SITE_OTHER): Payer: Medicare Other | Admitting: Family Medicine

## 2019-04-03 DIAGNOSIS — A084 Viral intestinal infection, unspecified: Secondary | ICD-10-CM | POA: Diagnosis not present

## 2019-04-03 MED ORDER — DIPHENOXYLATE-ATROPINE 2.5-0.025 MG PO TABS: 2.0000 | ORAL_TABLET | Freq: Four times a day (QID) | ORAL | 0 refills | Status: DC | PRN

## 2019-04-03 NOTE — Progress Notes (Signed)
Subjective:    Patient ID: April Beck, female    DOB: Sep 20, 1945, 73 y.o.   MRN: [id number]  HPI Patient is being seen today as a telephone visit.  She consents to be seen by telephone.  Phone call began at 817.  Phone call concluded at 827.  Symptoms began last Friday with a dull headache.  She took some Tylenol and the headache went away and has not returned.  However also on Friday she developed diarrhea.  She denies any bloody stools.  She denies any fever.  However she is having watery loose stools numerous times a day.  For instance she states that she went to the bathroom at least 5 times since yesterday at dinnertime.  She denies any abdominal pain.  She denies any travel.  She denies any antibiotic use recently.  She states that her stomach has not felt "right" since her colonoscopy on July 17.  She has been around no one who has had COVID to her knowledge.  She denies any cough.  She denies any shortness of breath.  She denies any rhinorrhea.  She denies any chest pain. Past Medical History:  Diagnosis Date  . 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 Allergy   . Anxiety    hx of panic attack  . Arthritis    hands & knees  . Galactose intolerance    Assymptomatic, but glucose in urine  . GERD (gastroesophageal reflux disease)   . Heart murmur    "small " per pt   . Hypercholesteremia   . Hypertension    off BP meds ~1 yr ago   . Hypothyroidism   . Osteopenia   . Renal insufficiency 06/19/2012   Past Surgical History:  Procedure Laterality Date  . ABDOMINAL HYSTERECTOMY    . CHOLECYSTECTOMY  06/20/2012   Procedure: LAPAROSCOPIC CHOLECYSTECTOMY;  Surgeon: Zenovia Jarred, MD;  Location: [facility];  Service: General;  Laterality: N/A;  . COLONOSCOPY  11/22/2008  . [place] Bladder sling  09/2005   Dr. Jeffie Pollock   Current Outpatient Medications on File Prior to Visit  Medication Sig Dispense Refill  . acetaminophen (TYLENOL) 325 MG tablet Take 2 tablets (650 mg total) by  mouth every 6 (six) hours as needed for pain.    [name] acyclovir (ZOVIRAX) 400 MG tablet Take 1 tablet (400 mg total) by mouth daily. 90 tablet 3  . atorvastatin (LIPITOR) 40 MG tablet TAKE 1 TABLET DAILY 90 tablet 1  . bisacodyl (DULCOLAX) 5 MG EC tablet Take 5 mg by mouth once. X 4 for colon prep 7-17    . citalopram (CELEXA) 10 MG tablet Take 1 tablet (10 mg total) by mouth daily. 90 tablet 3  . diazepam (VALIUM) 5 MG tablet TAKE 1 TABLET BY MOUTH AT BEDTIME AS NEEDED (TO RELAX MUSCLES). 30 tablet 1  . hydrochlorothiazide (HYDRODIURIL) 25 MG tablet Take 1 tablet (25 mg total) by mouth daily. 90 tablet 3  . levothyroxine (SYNTHROID, LEVOTHROID) 50 MCG tablet Take 1 tablet (50 mcg total) by mouth daily. 90 tablet 3  . meloxicam (MOBIC) 15 MG tablet TAKE 1 TABLET BY MOUTH EVERY DAY 30 tablet 2  . pantoprazole (PROTONIX) 40 MG tablet Take 1 tablet (40 mg total) by mouth 2 (two) times daily. 180 tablet 3  . potassium chloride SA (K-DUR,KLOR-CON) 20 MEQ tablet Take 1 tablet (20 mEq total) by mouth daily. 90 tablet 3   No current facility-administered medications on file prior to visit.    No  Known Allergies Social History   Socioeconomic History  . Marital status: Divorced    Spouse name: Not on file  . Number of children: Not on file  . Years of education: Not on file  . Highest education level: Not on file  Occupational History  . Not on file  Social Needs  . Financial resource strain: Not on file  . Food insecurity    Worry: Not on file    Inability: Not on file  . Transportation needs    Medical: Not on file    Non-medical: Not on file  Tobacco Use  . Smoking status: Never Smoker  . Smokeless tobacco: Never Used  Substance and Sexual Activity  . Alcohol use: No  . Drug use: No  . Sexual activity: Not on file  Lifestyle  . Physical activity    Days per week: Not on file    Minutes per session: Not on file  . Stress: Not on file  Relationships  . Social Herbalist  on phone: Not on file    Gets together: Not on file    Attends religious service: Not on file    Active member of club or organization: Not on file    Attends meetings of clubs or organizations: Not on file    Relationship status: Not on file  . Intimate partner violence    Fear of current or ex partner: Not on file    Emotionally abused: Not on file    Physically abused: Not on file    Forced sexual activity: Not on file  Other Topics Concern  . Not on file  Social History Narrative  . Not on file      Review of Systems  All other systems reviewed and are negative.      Objective:   Physical Exam  Physical exam cannot be performed today as the patient was seen as a telephone visit.      Assessment & Plan:  The encounter diagnosis was Viral gastroenteritis. I believe the patient likely has viral gastroenteritis.  I have recommended supportive care including Lomotil 2 tablets every 6 hours as needed for diarrhea.  I recommended that she push fluids like Gatorade and eat a brat diet.  Symptoms should gradually improve over the next 2 to 3 days if this is viral gastroenteritis.  Recheck immediately if worsening.  Temporarily discontinue hydrochlorothiazide and potassium due to potential dehydration and resume those medications once the diarrhea has resolved.  Recheck next week if no better.

## 2019-04-26 ENCOUNTER — Other Ambulatory Visit: Payer: Self-pay | Admitting: Family Medicine

## 2019-05-21 ENCOUNTER — Ambulatory Visit (INDEPENDENT_AMBULATORY_CARE_PROVIDER_SITE_OTHER): Payer: Medicare Other | Admitting: Family Medicine

## 2019-05-21 ENCOUNTER — Other Ambulatory Visit: Payer: Self-pay

## 2019-05-21 VITALS — BP 128/80 | HR 70 | Temp 98.6°F | Resp 14 | Ht 67.5 in | Wt 174.0 lb

## 2019-05-21 DIAGNOSIS — Z23 Encounter for immunization: Secondary | ICD-10-CM

## 2019-05-21 DIAGNOSIS — L299 Pruritus, unspecified: Secondary | ICD-10-CM | POA: Diagnosis not present

## 2019-05-21 MED ORDER — FLUOCINOLONE ACETONIDE BODY 0.01 % EX OIL: 1.0000 "application " | TOPICAL_OIL | Freq: Every day | CUTANEOUS | 0 refills | Status: DC | PRN

## 2019-05-21 NOTE — Addendum Note (Signed)
Addended by: Shary Decamp B on: 05/21/2019 03:55 PM   Modules accepted: Orders

## 2019-05-21 NOTE — Progress Notes (Signed)
Subjective:    Patient ID: April Beck, female    DOB: [birth year], 73 y.o.   MRN: [id number]  HPI Over the last 7 to 10 days, the patient has developed an itchy scalp.  She denies any change in her shampoo.  She denies any change in detergent, creams, lotions, medications.  No one lives with her.  No close contact has an itchy rash.  There is no lice in her scalp.  I examined her closely and I see no foreign bodies or infestation.  She has no rash anywhere else on her body.  There is some mild erythema scattered at the roots of her hair throughout her scalp but no significant plaque or psoriasis.  There are no papules or lesions.  Examination is relatively normal.  Therefore I suspect this may be neuropathic or potentially mild seborrheic dermatitis  Past Medical History:  Diagnosis Date  . 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 Allergy   . Anxiety    hx of panic attack  . Arthritis    hands & knees  . Galactose intolerance    Assymptomatic, but glucose in urine  . GERD (gastroesophageal reflux disease)   . Heart murmur    "small " per pt   . Hypercholesteremia   . Hypertension    off BP meds ~1 yr ago   . Hypothyroidism   . Osteopenia   . Renal insufficiency 06/19/2012   Past Surgical History:  Procedure Laterality Date  . ABDOMINAL HYSTERECTOMY    . CHOLECYSTECTOMY  06/20/2012   Procedure: LAPAROSCOPIC CHOLECYSTECTOMY;  Surgeon: Zenovia Jarred, MD;  Location: [facility];  Service: General;  Laterality: N/A;  . COLONOSCOPY  11/22/2008  . [place] Bladder sling  09/2005   Dr. Jeffie Pollock   Current Outpatient Medications on File Prior to Visit  Medication Sig Dispense Refill  . acetaminophen (TYLENOL) 325 MG tablet Take 2 tablets (650 mg total) by mouth every 6 (six) hours as needed for pain.    [name] acyclovir (ZOVIRAX) 400 MG tablet Take 1 tablet (400 mg total) by mouth daily. 90 tablet 3  . atorvastatin (LIPITOR) 40 MG tablet TAKE 1 TABLET DAILY 90 tablet 1  . citalopram  (CELEXA) 10 MG tablet Take 1 tablet (10 mg total) by mouth daily. 90 tablet 3  . diazepam (VALIUM) 5 MG tablet TAKE 1 TABLET BY MOUTH AT BEDTIME AS NEEDED (TO RELAX MUSCLES). 30 tablet 1  . diphenoxylate-atropine (LOMOTIL) 2.5-0.025 MG tablet Take 2 tablets by mouth 4 (four) times daily as needed for diarrhea or loose stools. 30 tablet 0  . hydrochlorothiazide (HYDRODIURIL) 25 MG tablet Take 1 tablet (25 mg total) by mouth daily. 90 tablet 3  . levothyroxine (SYNTHROID, LEVOTHROID) 50 MCG tablet Take 1 tablet (50 mcg total) by mouth daily. 90 tablet 3  . meloxicam (MOBIC) 15 MG tablet TAKE 1 TABLET BY MOUTH EVERY DAY 90 tablet 2  . pantoprazole (PROTONIX) 40 MG tablet Take 1 tablet (40 mg total) by mouth 2 (two) times daily. 180 tablet 3  . potassium chloride SA (K-DUR,KLOR-CON) 20 MEQ tablet Take 1 tablet (20 mEq total) by mouth daily. 90 tablet 3   No current facility-administered medications on file prior to visit.    No Known Allergies Social History   Socioeconomic History  . Marital status: Divorced    Spouse name: Not on file  . Number of children: Not on file  . Years of education: Not on file  . Highest education level: Not on  file  Occupational History  . Not on file  Social Needs  . Financial resource strain: Not on file  . Food insecurity    Worry: Not on file    Inability: Not on file  . Transportation needs    Medical: Not on file    Non-medical: Not on file  Tobacco Use  . Smoking status: Never Smoker  . Smokeless tobacco: Never Used  Substance and Sexual Activity  . Alcohol use: No  . Drug use: No  . Sexual activity: Not on file  Lifestyle  . Physical activity    Days per week: Not on file    Minutes per session: Not on file  . Stress: Not on file  Relationships  . Social Herbalist on phone: Not on file    Gets together: Not on file    Attends religious service: Not on file    Active member of club or organization: Not on file    Attends  meetings of clubs or organizations: Not on file    Relationship status: Not on file  . Intimate partner violence    Fear of current or ex partner: Not on file    Emotionally abused: Not on file    Physically abused: Not on file    Forced sexual activity: Not on file  Other Topics Concern  . Not on file  Social History Narrative  . Not on file     Review of Systems  All other systems reviewed and are negative.      Objective:   Physical Exam  Constitutional: She appears well-developed and well-nourished.  HENT:  Nose: No mucosal edema or rhinorrhea. Right sinus exhibits no maxillary sinus tenderness and no frontal sinus tenderness. Left sinus exhibits no maxillary sinus tenderness and no frontal sinus tenderness.  Neck: Neck supple.  Cardiovascular: Regular rhythm and normal heart sounds.  No murmur heard. Pulmonary/Chest: Effort normal and breath sounds normal. No respiratory distress. She has no wheezes.  Lymphadenopathy:    She has no cervical adenopathy.  Vitals reviewed.  Mild erythema at the roots of her hair follicles throughout her scalp.  There are no significant plaque like lesions, alopecia, swelling, papules, vesicles, etc.       Assessment & Plan:  Itchy scalp  Exam is relatively normal.  Therefore I suspect that the patient may have itchy scalp due to seborrheic dermatitis.  I have recommended using Derma-Smoothe oil once a day as needed until itching is better.  Then I have recommended that she switch either to head and shoulders or Selsun Blue medicated shampoo, she can scrub this into the scalp and allowed to sit for 10 to 15 minutes then rinse out.  She is to do this 2 times a week to help prevent this from reoccurring in the future.  If no better she is to contact me back.

## 2019-05-22 ENCOUNTER — Encounter: Payer: Self-pay | Admitting: Family Medicine

## 2019-05-22 MED ORDER — LEVOTHYROXINE SODIUM 50 MCG PO TABS: 50.0000 ug | ORAL_TABLET | Freq: Every day | ORAL | 3 refills | Status: DC

## 2019-05-22 MED ORDER — PANTOPRAZOLE SODIUM 40 MG PO TBEC: 40.0000 mg | DELAYED_RELEASE_TABLET | Freq: Two times a day (BID) | ORAL | 3 refills | Status: DC

## 2019-05-22 MED ORDER — HYDROCHLOROTHIAZIDE 25 MG PO TABS: 25.0000 mg | ORAL_TABLET | Freq: Every day | ORAL | 3 refills | Status: DC

## 2019-05-22 MED ORDER — CITALOPRAM HYDROBROMIDE 10 MG PO TABS: 10.0000 mg | ORAL_TABLET | Freq: Every day | ORAL | 3 refills | Status: DC

## 2019-05-22 MED ORDER — ATORVASTATIN CALCIUM 40 MG PO TABS: 40.0000 mg | ORAL_TABLET | Freq: Every day | ORAL | 3 refills | Status: DC

## 2019-05-25 ENCOUNTER — Telehealth: Payer: Self-pay | Admitting: *Deleted

## 2019-05-25 NOTE — Telephone Encounter (Signed)
Received fax requesting alternative to [facility] as this is not covered by insurance.   MD please advise.

## 2019-05-26 ENCOUNTER — Other Ambulatory Visit: Payer: Self-pay | Admitting: Family Medicine

## 2019-05-26 MED ORDER — HYDROCORTISONE BUTYRATE 0.1 % EX SOLN: 1.0000 "application " | Freq: Two times a day (BID) | CUTANEOUS | 0 refills | Status: DC | PRN

## 2019-05-26 NOTE — Telephone Encounter (Signed)
I sent in hydrocortisone solution but I have no idea what her insurance will cover.  If this is not covered, she will need to call her insurance to determine what they cover.

## 2019-05-28 ENCOUNTER — Ambulatory Visit (INDEPENDENT_AMBULATORY_CARE_PROVIDER_SITE_OTHER): Payer: Self-pay | Admitting: Cardiovascular Disease

## 2019-07-13 ENCOUNTER — Encounter: Payer: Medicare Other | Admitting: Family Medicine

## 2019-07-20 ENCOUNTER — Other Ambulatory Visit: Payer: Self-pay

## 2019-07-20 ENCOUNTER — Ambulatory Visit (INDEPENDENT_AMBULATORY_CARE_PROVIDER_SITE_OTHER): Payer: Medicare Other | Admitting: Family Medicine

## 2019-07-20 ENCOUNTER — Encounter: Payer: Self-pay | Admitting: Family Medicine

## 2019-07-20 VITALS — BP 118/74 | HR 78 | Temp 97.6°F | Resp 16 | Ht 67.5 in | Wt 174.0 lb

## 2019-07-20 DIAGNOSIS — Z0000 Encounter for general adult medical examination without abnormal findings: Secondary | ICD-10-CM | POA: Diagnosis not present

## 2019-07-20 DIAGNOSIS — M5431 Sciatica, right side: Secondary | ICD-10-CM

## 2019-07-20 DIAGNOSIS — M5432 Sciatica, left side: Secondary | ICD-10-CM

## 2019-07-20 DIAGNOSIS — I10 Essential (primary) hypertension: Secondary | ICD-10-CM

## 2019-07-20 DIAGNOSIS — E038 Other specified hypothyroidism: Secondary | ICD-10-CM

## 2019-07-20 DIAGNOSIS — E7800 Pure hypercholesterolemia, unspecified: Secondary | ICD-10-CM | POA: Diagnosis not present

## 2019-07-20 DIAGNOSIS — R8290 Unspecified abnormal findings in urine: Secondary | ICD-10-CM | POA: Diagnosis not present

## 2019-07-20 LAB — URINALYSIS, ROUTINE W REFLEX MICROSCOPIC
Bilirubin Urine: NEGATIVE
Glucose, UA: NEGATIVE
Hgb urine dipstick: NEGATIVE
Hyaline Cast: NONE SEEN /LPF
Ketones, ur: NEGATIVE
Nitrite: NEGATIVE
Protein, ur: NEGATIVE
RBC / HPF: NONE SEEN /HPF (ref 0–2)
Specific Gravity, Urine: 1.02 (ref 1.001–1.03)
pH: 6 (ref 5.0–8.0)

## 2019-07-20 LAB — MICROSCOPIC MESSAGE

## 2019-07-20 MED ORDER — DIPHENOXYLATE-ATROPINE 2.5-0.025 MG PO TABS: 2.0000 | ORAL_TABLET | Freq: Four times a day (QID) | ORAL | 0 refills | Status: DC | PRN

## 2019-07-20 NOTE — Progress Notes (Signed)
Subjective:    Patient ID: April Beck, female    DOB: [birth year], 73 y.o.   MRN: [id number]  Patient has a few concerns but she is here today for a physical exam.  First she reports foul-smelling urine.  It has been like that for approximately 1 week.  She denies any burning.  She denies any back pain.  She denies any fever.  She denies any urgency or hesitancy or hematuria.  She denies any abdominal pain.  Second she would like a refill on her Lomotil.  She uses occasionally when she gets diarrhea.  Third she reports bilateral leg pain.  Whenever she lays on her side at night she will have a burning nervelike pain radiate from her gluteus down the lateral aspect of her leg all the way into her feet.  Her feet will burn and staying.  If she switches sides the pain will go away however shortly later she will develop the same pain in the other side.  She has no tenderness to palpation over the greater trochanteric bursa and she has no pain in her hip.  This is a burning nerve like pain that radiates down her leg when she puts pressure on her legs.  She does complain of some low back pain.  Immunizations are up-to-date.  Mammogram was performed in July and was normal.  Bone density test was performed in July was significant for a T score of -1.4.  She does not require a Pap smear.  Her colonoscopy was performed earlier this summer and was normal.  She denies any problem with falls, memory loss, or depression  Immunization History  Administered Date(s) Administered  . Fluad Quad(high Dose 65+) 05/21/2019  . Influenza Split 06/20/2012  . Influenza, High Dose Seasonal PF 06/20/2017, 07/11/2018  . Influenza,inj,Quad PF,6+ Mos 06/01/2013, 05/13/2014, 06/27/2015  . Pneumococcal Conjugate-13 10/23/2013  . Pneumococcal Polysaccharide-23 06/20/2012  . Tdap 06/20/2017  . Zoster 08/04/2009  . Zoster Recombinat (Shingrix) 08/29/2017, 12/13/2017     Past Medical History:  Diagnosis Date  .  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 Allergy   . Anxiety    hx of panic attack  . Arthritis    hands & knees  . Galactose intolerance    Assymptomatic, but glucose in urine  . GERD (gastroesophageal reflux disease)   . Heart murmur    "small " per pt   . Hypercholesteremia   . Hypertension    off BP meds ~1 yr ago   . Hypothyroidism   . Osteopenia   . Renal insufficiency 06/19/2012   Past Surgical History:  Procedure Laterality Date  . ABDOMINAL HYSTERECTOMY    . CHOLECYSTECTOMY  06/20/2012   Procedure: LAPAROSCOPIC CHOLECYSTECTOMY;  Surgeon: Zenovia Jarred, MD;  Location: [place];  Service: General;  Laterality: N/A;  . COLONOSCOPY  11/22/2008  . [place] Bladder sling  09/2005   Dr. Jeffie Pollock   Current Outpatient Medications on File Prior to Visit  Medication Sig Dispense Refill  . acetaminophen (TYLENOL) 325 MG tablet Take 2 tablets (650 mg total) by mouth every 6 (six) hours as needed for pain.    [name] acyclovir (ZOVIRAX) 400 MG tablet Take 1 tablet (400 mg total) by mouth daily. 90 tablet 3  . atorvastatin (LIPITOR) 40 MG tablet Take 1 tablet (40 mg total) by mouth daily. 90 tablet 3  . citalopram (CELEXA) 10 MG tablet Take 1 tablet (10 mg total) by mouth daily. 90 tablet 3  . diazepam (VALIUM) 5  MG tablet TAKE 1 TABLET BY MOUTH AT BEDTIME AS NEEDED (TO RELAX MUSCLES). 30 tablet 1  . diphenoxylate-atropine (LOMOTIL) 2.5-0.025 MG tablet Take 2 tablets by mouth 4 (four) times daily as needed for diarrhea or loose stools. 30 tablet 0  . hydrochlorothiazide (HYDRODIURIL) 25 MG tablet Take 1 tablet (25 mg total) by mouth daily. 90 tablet 3  . Hydrocortisone Butyrate 0.1 % SOLN Apply 1 application topically 2 (two) times daily as needed. 60 mL 0  . levothyroxine (SYNTHROID) 50 MCG tablet Take 1 tablet (50 mcg total) by mouth daily. 90 tablet 3  . meloxicam (MOBIC) 15 MG tablet TAKE 1 TABLET BY MOUTH EVERY DAY 90 tablet 2  . pantoprazole (PROTONIX) 40 MG tablet Take 1 tablet (40 mg total)  by mouth 2 (two) times daily. 180 tablet 3  . potassium chloride SA (K-DUR,KLOR-CON) 20 MEQ tablet Take 1 tablet (20 mEq total) by mouth daily. 90 tablet 3   No current facility-administered medications on file prior to visit.    No Known Allergies Social History   Socioeconomic History  . Marital status: Divorced    Spouse name: Not on file  . Number of children: Not on file  . Years of education: Not on file  . Highest education level: Not on file  Occupational History  . Not on file  Social Needs  . Financial resource strain: Not on file  . Food insecurity    Worry: Not on file    Inability: Not on file  . Transportation needs    Medical: Not on file    Non-medical: Not on file  Tobacco Use  . Smoking status: Never Smoker  . Smokeless tobacco: Never Used  Substance and Sexual Activity  . Alcohol use: No  . Drug use: No  . Sexual activity: Not on file  Lifestyle  . Physical activity    Days per week: Not on file    Minutes per session: Not on file  . Stress: Not on file  Relationships  . Social Herbalist on phone: Not on file    Gets together: Not on file    Attends religious service: Not on file    Active member of club or organization: Not on file    Attends meetings of clubs or organizations: Not on file    Relationship status: Not on file  . Intimate partner violence    Fear of current or ex partner: Not on file    Emotionally abused: Not on file    Physically abused: Not on file    Forced sexual activity: Not on file  Other Topics Concern  . Not on file  Social History Narrative  . Not on file       Review of Systems  All other systems reviewed and are negative.      Objective:   Physical Exam  Constitutional: She is oriented to person, place, and time. She appears well-developed and well-nourished. No distress.  HENT:  Head: Normocephalic and atraumatic.  Right Ear: External ear normal.  Left Ear: External ear normal.  Nose:  Nose normal.  Mouth/Throat: Oropharynx is clear and moist. No oropharyngeal exudate.  Eyes: Pupils are equal, round, and reactive to light. Conjunctivae and EOM are normal. Right eye exhibits no discharge. Left eye exhibits no discharge. No scleral icterus.  Neck: Normal range of motion. Neck supple. No JVD present. No tracheal deviation present. No thyromegaly present.  Cardiovascular: Normal rate, regular rhythm, normal heart  sounds and intact distal pulses. Exam reveals no gallop and no friction rub.  No murmur heard. Pulmonary/Chest: Effort normal and breath sounds normal. No stridor. No respiratory distress. She has no wheezes. She has no rales. She exhibits no tenderness.  Abdominal: Soft. Bowel sounds are normal. She exhibits no distension and no mass. There is no abdominal tenderness. There is no rebound and no guarding.  Musculoskeletal:        General: No tenderness, deformity or edema.  Lymphadenopathy:    She has no cervical adenopathy.  Neurological: She is alert and oriented to person, place, and time. She has normal reflexes. No cranial nerve deficit. She exhibits normal muscle tone. Coordination normal.  Skin: Skin is warm. No rash noted. She is not diaphoretic. No erythema. No pallor.  Psychiatric: She has a normal mood and affect. Her behavior is normal. Judgment and thought content normal.  Vitals reviewed.         Assessment & Plan:  Foul smelling urine - Plan: Urinalysis, Routine w reflex microscopic  Bilateral sciatica - Plan: DG Lumbar Spine Complete  General medical exam  Benign essential HTN - Plan: CBC with Differential, COMPLETE METABOLIC PANEL WITH GFR, Lipid Panel  Other specified hypothyroidism - Plan: TSH  Pure hypercholesterolemia - Plan: CBC with Differential, COMPLETE METABOLIC PANEL WITH GFR, Lipid Panel  Patient's physical exam today is completely normal.  I recommended knee-high compression hose for the varicose veins that she has in both legs.   I have recommended 15 to 20 mmHg.  Obtain an x-ray of the lumbar spine given the bilateral sciatica the patient is having.  If the x-ray is normal I will treat the patient for possible neuropathy with gabapentin 300 mg p.o. nightly.  Blood pressure today is excellent.  Check CBC, CMP, fasting lipid panel.  Given her history of hypothyroidism check a TSH.  Obtain a urinalysis given her foul-smelling urine.  Urinalysis today is completely normal except for trace LE which I feel is likely contamination.  Mammogram, colonoscopy are all up-to-date.  Bone density test shows osteopenia but the patient is taking calcium and vitamin D.

## 2019-07-21 LAB — CBC WITH DIFFERENTIAL/PLATELET
Absolute Monocytes: 516 cells/uL (ref 200–950)
Basophils Absolute: 30 cells/uL (ref 0–200)
Basophils Relative: 0.5 %
Eosinophils Absolute: 162 cells/uL (ref 15–500)
Eosinophils Relative: 2.7 %
HCT: 39.7 % (ref 35.0–45.0)
Hemoglobin: 13.5 g/dL (ref 11.7–15.5)
Lymphs Abs: 2298 cells/uL (ref 850–3900)
MCH: 31.3 pg (ref 27.0–33.0)
MCHC: 34 g/dL (ref 32.0–36.0)
MCV: 91.9 fL (ref 80.0–100.0)
MPV: 10.1 fL (ref 7.5–12.5)
Monocytes Relative: 8.6 %
Neutro Abs: 2994 cells/uL (ref 1500–7800)
Neutrophils Relative %: 49.9 %
Platelets: 210 10*3/uL (ref 140–400)
RBC: 4.32 10*6/uL (ref 3.80–5.10)
RDW: 11.7 % (ref 11.0–15.0)
Total Lymphocyte: 38.3 %
WBC: 6 10*3/uL (ref 3.8–10.8)

## 2019-07-21 LAB — COMPLETE METABOLIC PANEL WITH GFR
AG Ratio: 2 (calc) (ref 1.0–2.5)
ALT: 17 U/L (ref 6–29)
AST: 24 U/L (ref 10–35)
Albumin: 4.3 g/dL (ref 3.6–5.1)
Alkaline phosphatase (APISO): 56 U/L (ref 37–153)
BUN/Creatinine Ratio: 18 (calc) (ref 6–22)
BUN: 22 mg/dL (ref 7–25)
CO2: 29 mmol/L (ref 20–32)
Calcium: 9.6 mg/dL (ref 8.6–10.4)
Chloride: 105 mmol/L (ref 98–110)
Creat: 1.19 mg/dL — ABNORMAL HIGH (ref 0.60–0.93)
GFR, Est African American: 53 mL/min/{1.73_m2} — ABNORMAL LOW (ref >=60)
GFR, Est Non African American: 46 mL/min/{1.73_m2} — ABNORMAL LOW (ref >=60)
Globulin: 2.2 g/dL (calc) (ref 1.9–3.7)
Glucose, Bld: 92 mg/dL (ref 65–99)
Potassium: 4.2 mmol/L (ref 3.5–5.3)
Sodium: 143 mmol/L (ref 135–146)
Total Bilirubin: 0.7 mg/dL (ref 0.2–1.2)
Total Protein: 6.5 g/dL (ref 6.1–8.1)

## 2019-07-21 LAB — LIPID PANEL
Cholesterol: 138 mg/dL (ref <200)
HDL: 48 mg/dL — ABNORMAL LOW (ref >=50)
LDL Cholesterol (Calc): 69 mg/dL (calc)
Non-HDL Cholesterol (Calc): 90 mg/dL (calc) (ref <130)
Total CHOL/HDL Ratio: 2.9 (calc) (ref <5.0)
Triglycerides: 128 mg/dL (ref <150)

## 2019-07-21 LAB — TSH: TSH: 1.1 mIU/L (ref 0.40–4.50)

## 2019-08-01 DIAGNOSIS — Z20828 Contact with and (suspected) exposure to other viral communicable diseases: Secondary | ICD-10-CM | POA: Diagnosis not present

## 2019-08-25 ENCOUNTER — Telehealth: Payer: Self-pay | Admitting: Family Medicine

## 2019-08-25 NOTE — Telephone Encounter (Signed)
Patient has a new prescription plan. She will need prescription sent in for 90 day supply it is [facility] for all of her medications except her meloxicam which [facility] [address]    [employer]  RX BIn [id number], RX PCN [id number], RX GRP  [facility], issuer number [id number]. [phone]) ID # [id number] issued to Azalee Course.   Prior Auth # [id number] Web: [email or web address] [phone]

## 2019-08-26 MED ORDER — POTASSIUM CHLORIDE CRYS ER 20 MEQ PO TBCR: 20.0000 meq | EXTENDED_RELEASE_TABLET | Freq: Every day | ORAL | 3 refills | Status: DC

## 2019-08-26 NOTE — Telephone Encounter (Signed)
All medications were sent over to the [facility] on 05/22/19 she has refills - will need to contact them to send her refills under new insurance claims. Will send pt a mychart message explaining this.

## 2019-09-04 ENCOUNTER — Encounter: Payer: Self-pay | Admitting: Family Medicine

## 2019-09-07 MED ORDER — HYDROCHLOROTHIAZIDE 25 MG PO TABS: 25.0000 mg | ORAL_TABLET | Freq: Every day | ORAL | 3 refills | Status: DC

## 2019-09-07 MED ORDER — POTASSIUM CHLORIDE CRYS ER 20 MEQ PO TBCR: 20.0000 meq | EXTENDED_RELEASE_TABLET | Freq: Every day | ORAL | 3 refills | Status: DC

## 2019-09-07 MED ORDER — PANTOPRAZOLE SODIUM 40 MG PO TBEC: 40.0000 mg | DELAYED_RELEASE_TABLET | Freq: Two times a day (BID) | ORAL | 3 refills | Status: DC

## 2019-09-07 MED ORDER — CITALOPRAM HYDROBROMIDE 10 MG PO TABS: 10.0000 mg | ORAL_TABLET | Freq: Every day | ORAL | 3 refills | Status: DC

## 2019-09-07 MED ORDER — ATORVASTATIN CALCIUM 40 MG PO TABS: 40.0000 mg | ORAL_TABLET | Freq: Every day | ORAL | 3 refills | Status: DC

## 2019-09-07 MED ORDER — MELOXICAM 15 MG PO TABS: 15.0000 mg | ORAL_TABLET | Freq: Every day | ORAL | 2 refills | Status: DC

## 2019-09-07 MED ORDER — LEVOTHYROXINE SODIUM 50 MCG PO TABS: 50.0000 ug | ORAL_TABLET | Freq: Every day | ORAL | 3 refills | Status: DC

## 2019-09-07 NOTE — Addendum Note (Signed)
Addended by: Shary Decamp B on: 09/07/2019 04:03 PM   Modules accepted: Orders

## 2019-09-09 DIAGNOSIS — Z20828 Contact with and (suspected) exposure to other viral communicable diseases: Secondary | ICD-10-CM | POA: Diagnosis not present

## 2019-10-13 ENCOUNTER — Encounter: Payer: Self-pay | Admitting: Family Medicine

## 2019-10-20 DIAGNOSIS — Z20828 Contact with and (suspected) exposure to other viral communicable diseases: Secondary | ICD-10-CM | POA: Diagnosis not present

## 2019-10-29 ENCOUNTER — Ambulatory Visit: Payer: Self-pay

## 2019-10-29 NOTE — Telephone Encounter (Addendum)
2nd attempt to contact patient left VM for her to return call to [phone]   Attempted to contact patient. Left VM for her to return call to [phone]. Patient has question about travel and testing for COVID-19 on return.

## 2019-10-29 NOTE — Telephone Encounter (Signed)
She has relatives that have chronic medical conditions that she would like to visit.  Pt advised that she could get the covid test after she has been back about 3 or 4 days. If she has been around any one that was positive, she would need to be tested.  There are no travel restrictions in place for [facility], and visitors do not have to quarantine upon arrival when coming into the state.  She voiced understanding.

## 2019-11-05 ENCOUNTER — Other Ambulatory Visit (INDEPENDENT_AMBULATORY_CARE_PROVIDER_SITE_OTHER): Payer: Self-pay | Admitting: Family Medicine

## 2019-11-12 DIAGNOSIS — Z20828 Contact with and (suspected) exposure to other viral communicable diseases: Secondary | ICD-10-CM | POA: Diagnosis not present

## 2019-11-30 ENCOUNTER — Other Ambulatory Visit: Payer: Self-pay

## 2019-11-30 ENCOUNTER — Ambulatory Visit: Payer: Medicare Other | Attending: Internal Medicine

## 2019-11-30 DIAGNOSIS — Z23 Encounter for immunization: Secondary | ICD-10-CM

## 2019-11-30 NOTE — Progress Notes (Signed)
   Covid-19 Vaccination Clinic  Name:  April Beck    MRN: [id number] DOB: [birth year]  11/30/2019  [name] was observed post Covid-19 immunization for 15 minutes without incident. She was provided with Vaccine Information Sheet and instruction to access the V-Safe system.   [name] was instructed to call 911 with any severe reactions post vaccine: [name] Difficulty breathing  . Swelling of face and throat  . A fast heartbeat  . A bad rash all over body  . Dizziness and weakness   Immunizations Administered    Name Date Dose VIS Date Route   Pfizer COVID-19 Vaccine 11/30/2019 12:17 PM 0.3 mL 08/14/2019 Intramuscular   Manufacturer: [place]   Lot: [id number]   [place]: [id number]

## 2019-12-07 ENCOUNTER — Encounter (INDEPENDENT_AMBULATORY_CARE_PROVIDER_SITE_OTHER): Payer: Self-pay | Admitting: Family Medicine

## 2019-12-07 NOTE — Progress Notes (Signed)
LVMTCB TO INFORM  PT DR.OSTROWSKI WILL NOT BE IN THE OFFICE PLEASE RESCHEDULE PT FOR THE WEEK OF THE April 26TH

## 2019-12-10 ENCOUNTER — Ambulatory Visit (INDEPENDENT_AMBULATORY_CARE_PROVIDER_SITE_OTHER): Payer: 59 | Admitting: Family Medicine

## 2019-12-14 ENCOUNTER — Ambulatory Visit (INDEPENDENT_AMBULATORY_CARE_PROVIDER_SITE_OTHER): Payer: Medicare Other | Admitting: Family Medicine

## 2019-12-14 ENCOUNTER — Other Ambulatory Visit: Payer: Self-pay

## 2019-12-14 ENCOUNTER — Encounter: Payer: Self-pay | Admitting: Family Medicine

## 2019-12-14 VITALS — BP 108/62 | HR 70 | Temp 97.3°F | Resp 16 | Ht 67.5 in | Wt 173.0 lb

## 2019-12-14 DIAGNOSIS — R040 Epistaxis: Secondary | ICD-10-CM

## 2019-12-14 MED ORDER — FLUTICASONE PROPIONATE 50 MCG/ACT NA SUSP: 2.0000 | Freq: Every day | NASAL | 6 refills | Status: DC

## 2019-12-14 NOTE — Progress Notes (Signed)
Subjective:    Patient ID: April Beck, female    DOB: 11-Jul-1946, 74 y.o.   MRN: [id number]  For the last month, the patient reports waking up every morning with a bloody crust inside her left nostril adherent to the anterior nasal septum.  If she does not scratch often remove the accumulating crust, she states that it feels like it will obstruct her nostril.  She denies any rhinorrhea or head congestion.  She denies any runny nose or pain in the right nostril.  On examination, there are 2 punctate sources of bleeding on the anterior nasal septum on the left side.  There is no visible mass or obstruction.  I believe the patient likely has a slowly bleeding capillary on the anterior left nasal septum and has a scab formed every morning while she sleeps that she then reaggravates when she scrapes off and removes foot to "remove the blockage".  Past Medical History:  Diagnosis Date  . 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 Allergy   . Anxiety    hx of panic attack  . Arthritis    hands & knees  . Galactose intolerance    Assymptomatic, but glucose in urine  . GERD (gastroesophageal reflux disease)   . Heart murmur    "small " per pt   . Hypercholesteremia   . Hypertension    off BP meds ~1 yr ago   . Hypothyroidism   . Osteopenia   . Renal insufficiency 06/19/2012   Past Surgical History:  Procedure Laterality Date  . ABDOMINAL HYSTERECTOMY    . CHOLECYSTECTOMY  06/20/2012   Procedure: LAPAROSCOPIC CHOLECYSTECTOMY;  Surgeon: Zenovia Jarred, MD;  Location: [place];  Service: General;  Laterality: N/A;  . COLONOSCOPY  11/22/2008  . [place] Bladder sling  09/2005   Dr. Jeffie Pollock   Current Outpatient Medications on File Prior to Visit  Medication Sig Dispense Refill  . acetaminophen (TYLENOL) 325 MG tablet Take 2 tablets (650 mg total) by mouth every 6 (six) hours as needed for pain.    [name] acyclovir (ZOVIRAX) 400 MG tablet Take 1 tablet (400 mg total) by mouth daily. 90 tablet 3    . atorvastatin (LIPITOR) 40 MG tablet Take 1 tablet (40 mg total) by mouth daily. 90 tablet 3  . citalopram (CELEXA) 10 MG tablet Take 1 tablet (10 mg total) by mouth daily. 90 tablet 3  . diazepam (VALIUM) 5 MG tablet TAKE 1 TABLET BY MOUTH AT BEDTIME AS NEEDED (TO RELAX MUSCLES). 30 tablet 1  . diphenoxylate-atropine (LOMOTIL) 2.5-0.025 MG tablet Take 2 tablets by mouth 4 (four) times daily as needed for diarrhea or loose stools. 30 tablet 0  . diphenoxylate-atropine (LOMOTIL) 2.5-0.025 MG tablet Take 2 tablets by mouth 4 (four) times daily as needed for diarrhea or loose stools. 30 tablet 0  . hydrochlorothiazide (HYDRODIURIL) 25 MG tablet Take 1 tablet (25 mg total) by mouth daily. 90 tablet 3  . Hydrocortisone Butyrate 0.1 % SOLN Apply 1 application topically 2 (two) times daily as needed. 60 mL 0  . levothyroxine (SYNTHROID) 50 MCG tablet Take 1 tablet (50 mcg total) by mouth daily. 90 tablet 3  . meloxicam (MOBIC) 15 MG tablet Take 1 tablet (15 mg total) by mouth daily. 90 tablet 2  . pantoprazole (PROTONIX) 40 MG tablet Take 1 tablet (40 mg total) by mouth 2 (two) times daily. 180 tablet 3  . potassium chloride SA (KLOR-CON) 20 MEQ tablet Take 1 tablet (20 mEq total) by  mouth daily. 90 tablet 3   No current facility-administered medications on file prior to visit.   No Known Allergies Social History   Socioeconomic History  . Marital status: Divorced    Spouse name: Not on file  . Number of children: Not on file  . Years of education: Not on file  . Highest education level: Not on file  Occupational History  . Not on file  Tobacco Use  . Smoking status: Never Smoker  . Smokeless tobacco: Never Used  Substance and Sexual Activity  . Alcohol use: No  . Drug use: No  . Sexual activity: Not on file  Other Topics Concern  . Not on file  Social History Narrative  . Not on file   Social Determinants of Health   Financial Resource Strain:   . Difficulty of Paying Living  Expenses:   Food Insecurity:   . Worried About Charity fundraiser in the Last Year:   . Arboriculturist in the Last Year:   Transportation Needs:   . Film/video editor (Medical):   [name] Lack of Transportation (Non-Medical):   Physical Activity:   . Days of Exercise per Week:   . Minutes of Exercise per Session:   Stress:   . Feeling of Stress :   Social Connections:   . Frequency of Communication with Friends and Family:   . Frequency of Social Gatherings with Friends and Family:   . Attends Religious Services:   . Active Member of Clubs or Organizations:   . Attends Archivist Meetings:   [name] Marital Status:   Intimate Partner Violence:   . Fear of Current or Ex-Partner:   . Emotionally Abused:   [name] Physically Abused:   . Sexually Abused:        Review of Systems  All other systems reviewed and are negative.      Objective:   Physical Exam  Constitutional: She appears well-developed and well-nourished. No distress.  HENT:  Head: Normocephalic and atraumatic.  Right Ear: External ear normal.  Left Ear: External ear normal.  Nose: Nose lacerations present. No mucosal edema or rhinorrhea. Epistaxis is observed.  Mouth/Throat: Oropharynx is clear and moist. No oropharyngeal exudate.  Eyes: Pupils are equal, round, and reactive to light. Conjunctivae and EOM are normal. Right eye exhibits no discharge. Left eye exhibits no discharge. No scleral icterus.  Cardiovascular: Normal rate, regular rhythm, normal heart sounds and intact distal pulses. Exam reveals no gallop and no friction rub.  No murmur heard. Pulmonary/Chest: Effort normal and breath sounds normal. No respiratory distress. She has no wheezes. She has no rales. She exhibits no tenderness.  Musculoskeletal:        General: No deformity.  Skin: She is not diaphoretic.  Vitals reviewed.         Assessment & Plan:  Recurrent epistaxis  Using silver nitrate, I cauterized the 2 sources of  bleeding on the left anterior nasal septum.  I believe the patient likely has a scab forming every morning due to slow accumulation of blood while she sleeps.  She then reaggravates this with her fingernails when she removes the obstruction causing a cycle of rebleeding and scabbing.  Therefore by cauterizing the sources of bleeding, I hope to prevent the scab from reaccumulating.  I also recommended that she apply Afrin to a cotton swab twice a day and pack the nose for 5 to 10 minutes in an attempt to apply Afrin to the  source of bleeding to prevent recurrent epistaxis.  Discontinue this practice after 3 days.  Reassess if no better in 1 week.

## 2019-12-16 ENCOUNTER — Ambulatory Visit (INDEPENDENT_AMBULATORY_CARE_PROVIDER_SITE_OTHER): Payer: 59 | Admitting: Family Medicine

## 2019-12-16 ENCOUNTER — Telehealth (INDEPENDENT_AMBULATORY_CARE_PROVIDER_SITE_OTHER): Payer: Self-pay | Admitting: Family Medicine

## 2019-12-16 ENCOUNTER — Encounter (INDEPENDENT_AMBULATORY_CARE_PROVIDER_SITE_OTHER): Payer: Self-pay | Admitting: Family Medicine

## 2019-12-16 VITALS — BP 138/70 | HR 85 | Temp 97.6°F | Ht 61.3 in | Wt 160.2 lb

## 2019-12-16 DIAGNOSIS — M25541 Pain in joints of right hand: Secondary | ICD-10-CM

## 2019-12-16 DIAGNOSIS — I10 Essential (primary) hypertension: Secondary | ICD-10-CM

## 2019-12-16 DIAGNOSIS — E119 Type 2 diabetes mellitus without complications: Secondary | ICD-10-CM

## 2019-12-16 DIAGNOSIS — M25542 Pain in joints of left hand: Secondary | ICD-10-CM

## 2019-12-16 DIAGNOSIS — E7800 Pure hypercholesterolemia, unspecified: Secondary | ICD-10-CM

## 2019-12-16 MED ORDER — DICLOFENAC SODIUM 1 % EX GEL
2.0000 g | Freq: Four times a day (QID) | CUTANEOUS | 3 refills | Status: DC
Start: 2019-12-16 — End: 2021-05-16

## 2019-12-16 NOTE — Telephone Encounter (Signed)
Pt is providing hemotologist name as she could not remember at the time of her visit.     Barry Dienes Micheal Likens, DM  [facility]  [address]  [place], [place] [address]    [phone]  Fax: [phone]

## 2019-12-16 NOTE — Progress Notes (Signed)
Have you seen any specialists/other providers since your last visit with [place]?    Yes, [facility].      Arm preference verified?   Yes, no preference    Health Maintenance Due   Topic Date Due    MAMMOGRAM  Never done    DM OPHTHALMOLOGY EXAM  Never done    URINE MICROALBUMIN  07/30/2013    PAP SMEAR  10/08/2015

## 2019-12-16 NOTE — Progress Notes (Signed)
Subjective:      Date: 12/16/2019 11:28 AM   Patient ID: Helen [name] is a 74 y.o. female.    Chief Complaint:  Chief Complaint   Patient presents with    Establish Care    Hypertension     med. refill valsartan-htz       HPI:  Pt was my patient at [facility], presents to be seen at [facility]. She was seen twice in the past few months at [facility], is now taking metformin and states that her blood glucose is much better (can't see lab results). Pt c/o pain in both hands, otherwise feels very well. 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   Pt has not yet had COVID vaccine.    Pt has stable essential HTN with no evidence of CHF or proteinuria.  The patient is compliant with anti-hypertensive therapy and denies side effects to therapy.  Pt denies CP, SOB, dizziness, orthopnea, PND or edema.  Pt has hyperlipidemia and is compliant with lipid therapy.  Pt denies side effects of lipid therapy - specifically denies myalgia.  Pt has Type II DM.  Pt is compliant with therapy.  Denies hypoglycemia.  Prior A1C is at goal.  Ophthalmology surveillance is UTD.  Problem List:  Patient Active Problem List   Diagnosis    Diabetes type 2, uncontrolled    Hypertension    Hyperlipidemia    H/O total hysterectomy with removal of both tubes and ovaries    Syncope    Acute viral syndrome    Heart block AV first degree       Current Medications:  Outpatient Medications Marked as Taking for the 12/16/19 encounter (Office Visit) with Dolan Amen, MD   Medication Sig Dispense Refill    albuterol (PROVENTIL) (2.5 MG/3ML) 0.083% nebulizer solution Take 2.5 mg by nebulization every 6 (six) hours as needed.      amLODIPine (NORVASC) 10 MG tablet Take 10 mg by mouth daily.      atorvastatin (LIPITOR) 40 MG tablet Take 40 mg by mouth daily.      Blood Glucose Monitoring Suppl (ONE TOUCH ULTRA 2) W/DEVICE KIT One daily 1 each 0     desonide (DESOWEN) 0.05 % cream 1 APPLICATION APPLY ON THE SKIN TWICE A DAY APPLY TO FACE TWICE DAILY.      ferrous sulfate 325 (65 FE) MG tablet ferrous sulfate 325 mg (65 mg iron) tablet      Flovent HFA 110 MCG/ACT inhaler Inhale 1 puff into the lungs 2 (two) times daily      glipiZIDE (GLUCOTROL) 5 MG tablet       glucose blood (ONE TOUCH ULTRA TEST) test strip 2 times daily 200 each 3    Lancets (ONETOUCH ULTRASOFT) lancets Use to check blood sugars 2 times daily 200 each 3    metFORMIN (GLUCOPHAGE) 1000 MG tablet Take 1,000 mg by mouth 2 (two) times daily      NASONEX 50 MCG/ACT nasal spray as needed.     5    pantoprazole (PROTONIX) 40 MG tablet Take 40 mg by mouth daily.      TRADJENTA 5 MG Tab   1    valsartan-hydrochlorothiazide (DIOVAN-HCT) 160-12.5 MG per tablet Take 1 tablet by mouth daily.       Current Facility-Administered Medications for the 12/16/19 encounter (Office Visit) with Dolan Amen, MD   Medication Dose  Route Frequency Provider Last Rate Last Admin    triamcinolone acetonide (KENALOG-40) 40 MG/ML injection 80 mg  80 mg Intra-articular Once Hallal, Nadim L, MD           Allergies:  Allergies   Allergen Reactions    Aspirin      Breat out in sweat and dizziness     Pollen Extract     Latex Itching and Rash       Past Medical History:  Past Medical History:   Diagnosis Date    Anemia     iron    currently on medication      Arthritis     general body      Asthma without status asthmaticus     Bilateral cataracts     Breast lump     Cancer     breast cancer    Diabetes mellitus type II     Gastroesophageal reflux disease     Hemorrhoids without complication     Hyperlipidemia     Hypertensive disorder     Malignant neoplasm of breast     RBBB        Past Surgical History:  Past Surgical History:   Procedure Laterality Date    ARTHROSCOPY, KNEE Left 04/21/2015    Procedure: ARTHROSCOPY, KNEE PARTIAL MEDIAL AND LATERAL MENISECTOMY LEFT KNEE;  Surgeon:  Bess Kinds, MD;  Location: [facility];  Service: Orthopedics;  Laterality: Left;  partial medial menisectomy    BREAST BIOPSY      HYSTERECTOMY      total    TUMOR REMOVAL Left     neck  benign        Family History:  Family History   Problem Relation Age of Onset    Diabetes Mother     Diabetes Sister     Malignant hyperthermia Neg Hx     Pseudochol deficiency Neg Hx     Anesthesia problems Neg Hx        Social History:  Social History     Tobacco Use    Smoking status: Never Smoker    Smokeless tobacco: Never Used   Substance Use Topics    Alcohol use: No    Drug use: No         The following sections were reviewed this encounter by the provider:          ROS:  Review of Systems   General/Constitutional:   Well developed, well nourished. Denies fever, chills, night sweats or fatigue.  Ophthalmologic:   Visual acuity and visual fields grossly intact.  ENT:   Denies nasal congestion or drainage. Denies sinus pain. Denies sore throat.   Respiratory:   Denies cough, shortness of breath or wheezing.  Cardiovascular:   Denies Chest pain at rest. Denies chest pain with exertion. Denies swelling of hands or feet.  Gastrointestinal:   Denies abdominal pain. Denies constipation or diarrhea. Denies nausea or vomiting.   Skin:   Denies rash or atypical skin lesions.  Neurologic:   Denies dizziness. Denies headache. Denies tingling in extremities. Denies weakness.      Objective:   Vitals:  BP 138/70 (BP Site: Right arm, Patient Position: Sitting)    Pulse 85    Temp 97.6 F (36.4 C) (Temporal)    Ht 1.557 m (5' 1.3")    Wt 72.7 kg (160 lb 3.2 oz)    SpO2 95%    BMI 29.97 kg/m  Physical Exam:  General Examination:   Physical Exam   GENERAL APPEARANCE: well developed, well nourished and in no acute distress. Pt is oriented to time, place, and person.   LUNGS: normal effort of breathing  EXTREMITIES: No LE edema bilaterally.   PERIPHERAL PULSES: normal   PSYCH: alert and oriented to time,place and  person. Affect unremarkable.  SKIN: moist and dry, no focal rash    Assessment:       1. Controlled type 2 diabetes mellitus without complication, without long-term current use of insulin    2. Essential hypertension    3. Pure hypercholesterolemia    4. Arthralgia of both hands        Plan:   Have prescribed diclofenac gel to be applied to hands qid prn pain. F/U 6 months, sooner prn. Pt scheduled for vaccine tomorrow.       Follow-up:   6 months, sooner prn.        Dolan Amen, MD

## 2019-12-16 NOTE — Telephone Encounter (Signed)
Dr. Barry Dienes. Micheal Likens has been added to patient's Care Teams.

## 2019-12-17 ENCOUNTER — Other Ambulatory Visit (INDEPENDENT_AMBULATORY_CARE_PROVIDER_SITE_OTHER): Payer: Self-pay | Admitting: Family Medicine

## 2019-12-17 ENCOUNTER — Ambulatory Visit (INDEPENDENT_AMBULATORY_CARE_PROVIDER_SITE_OTHER): Payer: 59

## 2019-12-17 ENCOUNTER — Telehealth (INDEPENDENT_AMBULATORY_CARE_PROVIDER_SITE_OTHER): Payer: Self-pay | Admitting: Family Medicine

## 2019-12-17 ENCOUNTER — Encounter (INDEPENDENT_AMBULATORY_CARE_PROVIDER_SITE_OTHER): Payer: Self-pay | Admitting: Family Medicine

## 2019-12-17 DIAGNOSIS — Z23 Encounter for immunization: Secondary | ICD-10-CM

## 2019-12-17 MED ORDER — AMLODIPINE BESYLATE 10 MG PO TABS
10.0000 mg | ORAL_TABLET | Freq: Every day | ORAL | 3 refills | Status: DC
Start: 2019-12-17 — End: 2020-10-12

## 2019-12-17 NOTE — Progress Notes (Signed)
After obtaining consent, and per orders of Dr. Ostrowski, injection of Moderna was given by Cayli Escajeda. Patient instructed to remain in clinic for 15 minutes afterwards, and to report any adverse reaction to me immediately.     Patient left in good condition.

## 2019-12-21 ENCOUNTER — Telehealth (INDEPENDENT_AMBULATORY_CARE_PROVIDER_SITE_OTHER): Payer: Self-pay | Admitting: Family Medicine

## 2019-12-21 ENCOUNTER — Other Ambulatory Visit (INDEPENDENT_AMBULATORY_CARE_PROVIDER_SITE_OTHER): Payer: Self-pay | Admitting: Family Medicine

## 2019-12-21 MED ORDER — VALSARTAN-HYDROCHLOROTHIAZIDE 160-12.5 MG PO TABS
1.0000 | ORAL_TABLET | Freq: Every day | ORAL | 3 refills | Status: DC
Start: 2019-12-21 — End: 2020-12-12

## 2019-12-21 NOTE — Telephone Encounter (Signed)
Pt requests referral for ENT specialist for a sinus infection that she has had off/on for about a year. Would like referral for:    Dr. Lavell Luster  [facility]  [address]  [place], [place] [address]    Please advise if pt needs to make appt first. Thank you.

## 2019-12-22 NOTE — Telephone Encounter (Signed)
Need to discuss before I send a referral. Video visit is okay, need to know how/if she has been treated and what she expects from ENT before I send a referral.

## 2019-12-22 NOTE — Telephone Encounter (Signed)
lvmtcb to schedule a v/v to discuss ENT referral request.

## 2019-12-23 ENCOUNTER — Ambulatory Visit: Payer: Medicare Other | Attending: Internal Medicine

## 2019-12-23 DIAGNOSIS — Z23 Encounter for immunization: Secondary | ICD-10-CM

## 2019-12-23 NOTE — Progress Notes (Signed)
   Covid-19 Vaccination Clinic  Name:  Faithe Hildreth    MRN: [id number] DOB: [birth year]  12/23/2019  Ms. Brodigan was observed post Covid-19 immunization for 15 minutes without incident. She was provided with Vaccine Information Sheet and instruction to access the V-Safe system.   Ms. Farrah was instructed to call 911 with any severe reactions post vaccine: [name] Difficulty breathing  . Swelling of face and throat  . A fast heartbeat  . A bad rash all over body  . Dizziness and weakness   Immunizations Administered    Name Date Dose VIS Date Route   Pfizer COVID-19 Vaccine 12/23/2019  1:12 PM 0.3 mL 10/28/2018 Intramuscular   Manufacturer: [employer], [employer]   Lot: [id number]   [place]: [id number]

## 2019-12-24 ENCOUNTER — Telehealth (INDEPENDENT_AMBULATORY_CARE_PROVIDER_SITE_OTHER): Payer: 59 | Admitting: Family Medicine

## 2019-12-24 ENCOUNTER — Encounter (INDEPENDENT_AMBULATORY_CARE_PROVIDER_SITE_OTHER): Payer: Self-pay | Admitting: Family Medicine

## 2019-12-24 DIAGNOSIS — Q308 Other congenital malformations of nose: Secondary | ICD-10-CM

## 2019-12-24 NOTE — Progress Notes (Signed)
This is a telehealth visit which was conducted with the use of interactive telecommunication that permitted real time communication between [name] and myself.      she  consented to participation and received services at home, while I was located at the [facility] office.      This visit was changed from an in-person visit to a telehealth visit to lower the risk of exposure and / or spread of the current pandemic with the SARS CoV-2 virus. This is based on the guidelines from the [facility] and other health agencies.     Subjective:      Date: 12/24/2019 4:43 PM   Patient ID: Helen [name] is a 74 y.o. female.    Chief Complaint:  Chief Complaint   Patient presents with    Sinus Problem     problems with her sinuses for sometime- dry nostrils during the night allergy dr. gave her nasonex which helps. Referral for ENT       HPI:  Pt with chronic dry nose at night, takes flonase during the day that her allergist gave her and it does help keep the nose moist. Pt would like referral to ENT to see if there is something they can do to help.   Problem List:  Patient Active Problem List   Diagnosis    Diabetes type 2, uncontrolled    Hypertension    Hyperlipidemia    H/O total hysterectomy with removal of both tubes and ovaries    Syncope    Acute viral syndrome    Heart block AV first degree       Current Medications:  Outpatient Medications Marked as Taking for the 12/24/19 encounter (Telemedicine Visit) with Dolan Amen, MD   Medication Sig Dispense Refill    albuterol (PROVENTIL) (2.5 MG/3ML) 0.083% nebulizer solution Take 2.5 mg by nebulization every 6 (six) hours as needed.      amLODIPine (NORVASC) 10 MG tablet Take 1 tablet (10 mg total) by mouth daily 90 tablet 3    atorvastatin (LIPITOR) 40 MG tablet Take 40 mg by mouth daily.      Blood Glucose Monitoring Suppl (ONE TOUCH ULTRA 2) W/DEVICE KIT One daily 1 each 0    desonide (DESOWEN) 0.05 % cream 1 APPLICATION APPLY ON THE  SKIN TWICE A DAY APPLY TO FACE TWICE DAILY.      diclofenac Sodium (VOLTAREN) 1 % Gel topical gel Apply 2 g topically 4 (four) times daily 1 Tube 3    ferrous sulfate 325 (65 FE) MG tablet ferrous sulfate 325 mg (65 mg iron) tablet      Flovent HFA 110 MCG/ACT inhaler Inhale 1 puff into the lungs 2 (two) times daily      fluticasone (FLONASE) 50 MCG/ACT nasal spray       glipiZIDE (GLUCOTROL) 5 MG tablet       glucose blood (ONE TOUCH ULTRA TEST) test strip 2 times daily 200 each 3    Lancets (ONETOUCH ULTRASOFT) lancets Use to check blood sugars 2 times daily 200 each 3    metFORMIN (GLUCOPHAGE) 1000 MG tablet Take 1,000 mg by mouth 2 (two) times daily      montelukast (SINGULAIR) 10 MG tablet       NASONEX 50 MCG/ACT nasal spray as needed.     5    pantoprazole (PROTONIX) 40 MG tablet Take 40 mg by mouth daily.      TRADJENTA 5 MG Tab  1    valsartan-hydroCHLOROthiazide (DIOVAN-HCT) 160-12.5 MG per tablet Take 1 tablet by mouth daily 90 tablet 3     Current Facility-Administered Medications for the 12/24/19 encounter (Telemedicine Visit) with Dolan Amen, MD   Medication Dose Route Frequency Provider Last Rate Last Admin    triamcinolone acetonide (KENALOG-40) 40 MG/ML injection 80 mg  80 mg Intra-articular Once Hallal, Nadim L, MD           Allergies:  Allergies   Allergen Reactions    Aspirin      Breat out in sweat and dizziness     Pollen Extract     Latex Itching and Rash       Past Medical History:  Past Medical History:   Diagnosis Date    Anemia     iron    currently on medication      Arthritis     general body      Asthma without status asthmaticus     Bilateral cataracts     Breast lump     Cancer     breast cancer    Diabetes mellitus type II     Gastroesophageal reflux disease     Hemorrhoids without complication     Hyperlipidemia     Hypertensive disorder     Malignant neoplasm of breast     RBBB        Past Surgical History:  Past Surgical History:    Procedure Laterality Date    ARTHROSCOPY, KNEE Left 04/21/2015    Procedure: ARTHROSCOPY, KNEE PARTIAL MEDIAL AND LATERAL MENISECTOMY LEFT KNEE;  Surgeon: Bess Kinds, MD;  Location: [facility];  Service: Orthopedics;  Laterality: Left;  partial medial menisectomy    BREAST BIOPSY      HYSTERECTOMY      total    TUMOR REMOVAL Left     neck  benign        Family History:  Family History   Problem Relation Age of Onset    Diabetes Mother     Diabetes Sister     Malignant hyperthermia Neg Hx     Pseudochol deficiency Neg Hx     Anesthesia problems Neg Hx        Social History:  Social History     Tobacco Use    Smoking status: Never Smoker    Smokeless tobacco: Never Used   Substance Use Topics    Alcohol use: No    Drug use: No         The following sections were reviewed this encounter by the provider:   Tobacco   Allergies   Meds   Problems   Med Hx   Surg Hx   Fam Hx            ROS:  Review of Systems   General/Constitutional:   Well developed, well nourished. Denies fever, chills, night sweats or fatigue.  ENT:   Denies nasal congestion or drainage. Denies sinus pain. Denies sore throat. Pt does c/o dry nostrils at night only.  Respiratory:   Denies cough, shortness of breath or wheezing.  Cardiovascular:   Denies Chest pain at rest. Denies chest pain with exertion. Denies swelling of hands or feet.  Gastrointestinal:   Denies abdominal pain. Denies constipation or diarrhea. Denies nausea or vomiting.   Skin:   Denies rash or atypical skin lesions.  Neurologic:   Denies dizziness. Denies headache. Denies tingling in extremities. Denies weakness.  Objective:   Vitals:  There were no vitals taken for this visit.      Physical Exam:  General Examination:   Physical Exam   GENERAL APPEARANCE: well developed, well nourished and in no acute distress. Pt is oriented to time, place, and person.   LUNGS: normal effort of breathing  PSYCH: alert and oriented to time,place and person. Affect  unremarkable.  SKIN: moist and dry, no focal rash    Assessment:       1. Congenital anomaly of nasal sinuses  - Referral to ENT - EXTERNAL        Plan:   Have put in a referral to ENT, f/u as needed.       Follow-up:   No follow-ups on file.       Dolan Amen, MD

## 2019-12-24 NOTE — Progress Notes (Signed)
Have you seen any specialists/other providers since your last visit with [place]?    No    Do you agree to telemedicine visit?  Yes      Health Maintenance Due   Topic Date Due    FALLS RISK ANNUAL  Never done    Advance Directive on File  Never done    MAMMOGRAM  Never done    DM OPHTHALMOLOGY EXAM  Never done    DXA Scan  Never done    PCMH CARE PLAN LETTER  Never done    Medicare Annual Wellness Visit  Never done    Shingrix Vaccine 50+ (1) Never done    HEPATITIS C SCREENING  Never done    URINE MICROALBUMIN  07/30/2013    HEMOGLOBIN A1C ANNUAL  10/11/2014    PAP SMEAR  10/08/2015

## 2020-01-08 ENCOUNTER — Other Ambulatory Visit (INDEPENDENT_AMBULATORY_CARE_PROVIDER_SITE_OTHER): Payer: Self-pay | Admitting: Family Medicine

## 2020-01-11 NOTE — Telephone Encounter (Signed)
Patient called, no answer, LVM to call office.

## 2020-01-12 ENCOUNTER — Telehealth (INDEPENDENT_AMBULATORY_CARE_PROVIDER_SITE_OTHER): Payer: Self-pay | Admitting: Family Medicine

## 2020-01-12 ENCOUNTER — Other Ambulatory Visit (INDEPENDENT_AMBULATORY_CARE_PROVIDER_SITE_OTHER): Payer: Self-pay | Admitting: Family Medicine

## 2020-01-12 NOTE — Telephone Encounter (Signed)
Pt request refill of TRADJENTA 5 MG Tab sent to pharmacy on file. Thanks.

## 2020-01-14 ENCOUNTER — Encounter (INDEPENDENT_AMBULATORY_CARE_PROVIDER_SITE_OTHER): Payer: Self-pay

## 2020-01-14 ENCOUNTER — Ambulatory Visit (INDEPENDENT_AMBULATORY_CARE_PROVIDER_SITE_OTHER): Payer: 59

## 2020-01-14 DIAGNOSIS — Z23 Encounter for immunization: Secondary | ICD-10-CM

## 2020-01-21 ENCOUNTER — Encounter (INDEPENDENT_AMBULATORY_CARE_PROVIDER_SITE_OTHER): Payer: Self-pay | Admitting: Family Medicine

## 2020-01-26 ENCOUNTER — Other Ambulatory Visit: Payer: Self-pay | Admitting: Hematology & Oncology

## 2020-01-27 ENCOUNTER — Encounter (INDEPENDENT_AMBULATORY_CARE_PROVIDER_SITE_OTHER): Payer: Self-pay | Admitting: Family Medicine

## 2020-01-28 ENCOUNTER — Encounter (INDEPENDENT_AMBULATORY_CARE_PROVIDER_SITE_OTHER): Payer: Self-pay | Admitting: Family Medicine

## 2020-02-02 ENCOUNTER — Ambulatory Visit (INDEPENDENT_AMBULATORY_CARE_PROVIDER_SITE_OTHER): Payer: 59 | Admitting: Family Medicine

## 2020-02-05 ENCOUNTER — Encounter (INDEPENDENT_AMBULATORY_CARE_PROVIDER_SITE_OTHER): Payer: Self-pay

## 2020-02-05 ENCOUNTER — Encounter (INDEPENDENT_AMBULATORY_CARE_PROVIDER_SITE_OTHER): Payer: Self-pay | Admitting: Family Medicine

## 2020-02-05 NOTE — Progress Notes (Signed)
Called pt to inform that provider will be out of office 02/08/20. Appt needs to be rescheduled. No answer, left VM. Also sent mychart message.

## 2020-02-08 ENCOUNTER — Ambulatory Visit (INDEPENDENT_AMBULATORY_CARE_PROVIDER_SITE_OTHER): Payer: 59 | Admitting: Family Medicine

## 2020-02-08 ENCOUNTER — Telehealth (INDEPENDENT_AMBULATORY_CARE_PROVIDER_SITE_OTHER): Payer: Self-pay | Admitting: Family Medicine

## 2020-02-08 DIAGNOSIS — E119 Type 2 diabetes mellitus without complications: Secondary | ICD-10-CM

## 2020-02-08 DIAGNOSIS — Z13220 Encounter for screening for lipoid disorders: Secondary | ICD-10-CM

## 2020-02-08 NOTE — Telephone Encounter (Signed)
Last labs (BMP and A1c) completed at [facility] on 09/18/19.

## 2020-02-08 NOTE — Telephone Encounter (Signed)
Pt is coming in on Wed 6/9 for med refill appt at 4pm. Will need labwork done for med refills (metFORMIN (GLUCOPHAGE) 1000 MG tablet, glipiZIDE (GLUCOTROL) 5 MG tablet), but lab closes at 4pm. I scheduled her for labs at 3:30, but will need lab orders added to chart so she can get labs done before appt. Thanks.

## 2020-02-09 NOTE — Telephone Encounter (Signed)
Let pt know I have entered lab orders

## 2020-02-10 ENCOUNTER — Other Ambulatory Visit (FREE_STANDING_LABORATORY_FACILITY): Payer: 59

## 2020-02-10 ENCOUNTER — Ambulatory Visit (INDEPENDENT_AMBULATORY_CARE_PROVIDER_SITE_OTHER): Payer: 59 | Admitting: Family

## 2020-02-10 ENCOUNTER — Encounter (INDEPENDENT_AMBULATORY_CARE_PROVIDER_SITE_OTHER): Payer: Self-pay | Admitting: Family

## 2020-02-10 VITALS — BP 135/70 | HR 95 | Temp 98.5°F | Ht 61.0 in | Wt 156.8 lb

## 2020-02-10 DIAGNOSIS — Z889 Allergy status to unspecified drugs, medicaments and biological substances status: Secondary | ICD-10-CM

## 2020-02-10 DIAGNOSIS — E119 Type 2 diabetes mellitus without complications: Secondary | ICD-10-CM

## 2020-02-10 DIAGNOSIS — Z13220 Encounter for screening for lipoid disorders: Secondary | ICD-10-CM

## 2020-02-10 DIAGNOSIS — K219 Gastro-esophageal reflux disease without esophagitis: Secondary | ICD-10-CM

## 2020-02-10 DIAGNOSIS — M545 Low back pain, unspecified: Secondary | ICD-10-CM

## 2020-02-10 LAB — CBC AND DIFFERENTIAL
Absolute NRBC: 0 10*3/uL (ref 0.00–0.00)
Basophils Absolute Automated: 0.07 10*3/uL (ref 0.00–0.08)
Basophils Automated: 1.1 %
Eosinophils Absolute Automated: 0.08 10*3/uL (ref 0.00–0.44)
Eosinophils Automated: 1.3 %
Hematocrit: 34 % — ABNORMAL LOW (ref 34.7–43.7)
Hgb: 10.5 g/dL — ABNORMAL LOW (ref 11.4–14.8)
Immature Granulocytes Absolute: 0.02 10*3/uL (ref 0.00–0.07)
Immature Granulocytes: 0.3 %
Lymphocytes Absolute Automated: 2.24 10*3/uL (ref 0.42–3.22)
Lymphocytes Automated: 36 %
MCH: 23.2 pg — ABNORMAL LOW (ref 25.1–33.5)
MCHC: 30.9 g/dL — ABNORMAL LOW (ref 31.5–35.8)
MCV: 75.2 fL — ABNORMAL LOW (ref 78.0–96.0)
MPV: 10.6 fL (ref 8.9–12.5)
Monocytes Absolute Automated: 0.81 10*3/uL (ref 0.21–0.85)
Monocytes: 13 %
Neutrophils Absolute: 3 10*3/uL (ref 1.10–6.33)
Neutrophils: 48.3 %
Nucleated RBC: 0 /100 WBC (ref 0.0–0.0)
Platelets: 240 10*3/uL (ref 142–346)
RBC: 4.52 10*6/uL (ref 3.90–5.10)
RDW: 14 % (ref 11–15)
WBC: 6.22 10*3/uL (ref 3.10–9.50)

## 2020-02-10 LAB — LIPID PANEL
Cholesterol / HDL Ratio: 2.3
Cholesterol: 134 mg/dL (ref 0–199)
HDL: 59 mg/dL (ref 40–9999)
LDL Calculated: 65 mg/dL (ref 0–99)
Triglycerides: 50 mg/dL (ref 34–149)
VLDL Calculated: 10 mg/dL (ref 10–40)

## 2020-02-10 LAB — COMPREHENSIVE METABOLIC PANEL
ALT: 15 U/L (ref 0–55)
AST (SGOT): 17 U/L (ref 5–34)
Albumin/Globulin Ratio: 1.1 (ref 0.9–2.2)
Albumin: 3.7 g/dL (ref 3.5–5.0)
Alkaline Phosphatase: 62 U/L (ref 37–106)
Anion Gap: 9 (ref 5.0–15.0)
BUN: 16 mg/dL (ref 7.0–19.0)
Bilirubin, Total: 0.9 mg/dL (ref 0.2–1.2)
CO2: 25 mEq/L (ref 21–29)
Calcium: 9.7 mg/dL (ref 7.9–10.2)
Chloride: 106 mEq/L (ref 100–111)
Creatinine: 0.9 mg/dL (ref 0.4–1.5)
Globulin: 3.5 g/dL (ref 2.0–3.7)
Glucose: 127 mg/dL — ABNORMAL HIGH (ref 70–100)
Potassium: 4.2 mEq/L (ref 3.5–5.1)
Protein, Total: 7.2 g/dL (ref 6.0–8.3)
Sodium: 140 mEq/L (ref 136–145)

## 2020-02-10 LAB — MICROALBUMIN, RANDOM URINE
Urine Creatinine, Random: 141.5 mg/dL
Urine Microalbumin, Random: 28 (ref 0.0–30.0)
Urine Microalbumin/Creatinine Ratio: 20 ug/mg (ref 0–30)

## 2020-02-10 LAB — GFR: EGFR: >60

## 2020-02-10 LAB — HEMOLYSIS INDEX: Hemolysis Index: 5 (ref 0–18)

## 2020-02-10 LAB — HEMOGLOBIN A1C
Average Estimated Glucose: 165.7 mg/dL
Hemoglobin A1C: 7.4 % — ABNORMAL HIGH (ref 4.6–5.9)

## 2020-02-10 MED ORDER — EPIPEN 2-PAK 0.3 MG/0.3ML IJ SOAJ
0.30 mg | Freq: Once | INTRAMUSCULAR | 0 refills | Status: DC | PRN
Start: 2020-02-10 — End: 2020-12-12

## 2020-02-10 MED ORDER — GLIPIZIDE 5 MG PO TABS
10.0000 mg | ORAL_TABLET | Freq: Two times a day (BID) | ORAL | 0 refills | Status: DC
Start: 2020-02-10 — End: 2020-04-25

## 2020-02-10 MED ORDER — PANTOPRAZOLE SODIUM 40 MG PO TBEC
40.00 mg | DELAYED_RELEASE_TABLET | Freq: Every day | ORAL | 2 refills | Status: DC
Start: 2020-02-10 — End: 2020-05-10

## 2020-02-10 MED ORDER — METFORMIN HCL 1000 MG PO TABS
1000.0000 mg | ORAL_TABLET | Freq: Two times a day (BID) | ORAL | 1 refills | Status: DC
Start: 2020-02-10 — End: 2020-05-30

## 2020-02-10 NOTE — Addendum Note (Signed)
Addended by: Lonni Fix on: 02/10/2020 03:34 PM     Modules accepted: Orders

## 2020-02-10 NOTE — Progress Notes (Signed)
[facility]  Laurell Josephs  PROGRESS NOTE      Patient: Helen [name]   Date: 02/10/2020   MRN: [id number]     Helen [name] is a 74 y.o. female    Chief Complaint   Patient presents with    Medication Refill     pt needs metformin, glipizide, and pantoprazole refilled pt also needs an epi pen because hers expired    Back Pain     pt hurt lower right back after falling down a 10 step flight of stairs. onset 1 month ago . pt has trouble turning either way.        MEDICATIONS     Current Outpatient Medications   Medication Sig Dispense Refill    albuterol (PROVENTIL) (2.5 MG/3ML) 0.083% nebulizer solution Take 2.5 mg by nebulization every 6 (six) hours as needed.      amLODIPine (NORVASC) 10 MG tablet Take 1 tablet (10 mg total) by mouth daily 90 tablet 3    atorvastatin (LIPITOR) 40 MG tablet Take 40 mg by mouth daily.      Blood Glucose Monitoring Suppl (ONE TOUCH ULTRA 2) W/DEVICE KIT One daily 1 each 0    desonide (DESOWEN) 0.05 % cream 1 APPLICATION APPLY ON THE SKIN TWICE A DAY APPLY TO FACE TWICE DAILY.      diclofenac Sodium (VOLTAREN) 1 % Gel topical gel Apply 2 g topically 4 (four) times daily 1 Tube 3    ferrous sulfate 325 (65 FE) MG tablet ferrous sulfate 325 mg (65 mg iron) tablet      Flovent HFA 110 MCG/ACT inhaler Inhale 1 puff into the lungs 2 (two) times daily      fluticasone (FLONASE) 50 MCG/ACT nasal spray       glipiZIDE (GLUCOTROL) 5 MG tablet Take 2 tablets (10 mg total) by mouth 2 (two) times daily before meals 360 tablet 0    glucose blood (ONE TOUCH ULTRA TEST) test strip 2 times daily 200 each 3    Lancets (ONETOUCH ULTRASOFT) lancets Use to check blood sugars 2 times daily 200 each 3    metFORMIN (GLUCOPHAGE) 1000 MG tablet Take 1 tablet (1,000 mg total) by mouth 2 (two) times daily 180 tablet 1    montelukast (SINGULAIR) 10 MG tablet       NASONEX 50 MCG/ACT nasal spray as needed.     5    pantoprazole (PROTONIX) 40 MG tablet Take 1 tablet (40 mg total) by mouth daily  90 tablet 2    Tradjenta 5 MG Tab TAKE 1 TABLET BY MOUTH EVERY DAY 90 tablet 1    valsartan-hydroCHLOROthiazide (DIOVAN-HCT) 160-12.5 MG per tablet Take 1 tablet by mouth daily 90 tablet 3    EPINEPHrine (EPIPEN 2-PAK) 0.3 MG/0.3ML Solution Auto-injector injection Inject 0.3 mLs (0.3 mg total) into the muscle once as needed (Anaphylaxis) 1 each 0     Current Facility-Administered Medications   Medication Dose Route Frequency Provider Last Rate Last Admin    triamcinolone acetonide (KENALOG-40) 40 MG/ML injection 80 mg  80 mg Intra-articular Once Hallal, Nadim L, MD           Allergies   Allergen Reactions    Aspirin      Breat out in sweat and dizziness     Pollen Extract     Latex Itching and Rash       SUBJECTIVE     Chief Complaint   Patient presents with    Medication Refill  pt needs metformin, glipizide, and pantoprazole refilled pt also needs an epi pen because hers expired    Back Pain     pt hurt lower right back after falling down a 10 step flight of stairs. onset 1 month ago . pt has trouble turning either way.         HPI   Pt here for DM and GERD f/u with med refills. Had her labs drawn this afternoon. Also has acute back pain.   1) DMII: pt is compliant with glipizide/ metformin. Takes glipizide 10mg  BID and metformin 1000mg  BID. states she does check her sugars at home- usually range 120-130. States she is compliant with her diet. She exercises 3-4 times per week.   2) Hx GERD- states its well controlled on current medications.   3) Larey Seat about 1 month ago hurting her right lower back. States she has a hx arthritis in her back but this pain feels different. States pain is exererbated with certain twists. Denies numbness/ tingling/ weakness/ leg pain. Has been doing topical treatments for pain relief as needed. Will consider PT.  4) also would like refill on epi-pen, states hers is expired.  ROS     Review of Systems   Constitutional: Negative for appetite change, fatigue and unexpected  weight change.   Respiratory: Negative for cough and shortness of breath.    Cardiovascular: Negative for chest pain and palpitations.   Gastrointestinal: Negative for abdominal pain, bowel incontinence, diarrhea, nausea and vomiting.   Endocrine: Negative for polydipsia and polyuria.   Genitourinary: Negative for bladder incontinence.   Musculoskeletal: Positive for back pain.   Neurological: Negative for dizziness, tingling, weakness, numbness, headaches and paresthesias.             The following portions of the patient's history were reviewed and updated as appropriate: Allergies, Current Medications, Past Family History, Past Medical history, Past social history, Past surgical history, and Problem List.    PHYSICAL EXAM     Vitals:    02/10/20 1538   BP: 135/70   BP Site: Right arm   Patient Position: Sitting   Cuff Size: Medium   Pulse: 95   Temp: 98.5 F (36.9 C)   SpO2: 95%   Weight: 71.1 kg (156 lb 12.8 oz)   Height: 1.549 m (5\' 1" )       Results for orders placed or performed during the hospital encounter of 04/21/15   Glucose Whole Blood - POCT   Result Value Ref Range    Whole Blood Glucose POCT 187 (H) 70 - 100 mg/dL       Physical Exam  Vitals and nursing note reviewed.   Constitutional:       General: She is awake. She is not in acute distress.     Appearance: Normal appearance.   Pulmonary:      Effort: Pulmonary effort is normal.   Musculoskeletal:      Cervical back: Normal.      Thoracic back: Normal.      Lumbar back: Tenderness present. No bony tenderness. Normal range of motion.        Back:    Skin:     General: Skin is warm and dry.   Neurological:      Mental Status: She is alert and oriented to person, place, and time.   Psychiatric:         Mood and Affect: Mood normal.         Speech: Speech normal.  Behavior: Behavior normal.             ASSESSMENT/PLAN        1. Acute right-sided low back pain without sciatica  Acute, improving. Suspect muscle strain. Can continue with OTC  topical anelgesics as needed. Ice/ heat, gentle stretching and core strengthening exercises as tolerated. Discussed referral for PT but pt declined today, will f/u if needed. Reviewed "red flag" symptoms that would warrant more immediate medical attention.     Ddx: lumbar strain, sciatica/ radiculopathy, lumbar DDD, spinal stenosis, herniated disc     2. Controlled type 2 diabetes mellitus without complication, without long-term current use of insulin  metFORMIN (GLUCOPHAGE) 1000 MG tablet    glipiZIDE (GLUCOTROL) 5 MG tablet  Chronic, well controlled. Continuing on current medications pending lab results. Continue with lifestyle modification.      3. Gastroesophageal reflux disease without esophagitis  pantoprazole (PROTONIX) 40 MG tablet  Chronic, well controlled. Continue current medication.     4. History of allergic reaction  EPINEPHrine (EPIPEN 2-PAK) 0.3 MG/0.3ML Solution Auto-injector injection     Reviewed med use and side effects. Reviewed s/s that would warrant further and/ or immediate medical attention. Pt in agreement with plan and all questions answered.       Risk & Benefits of the new medication(s) were explained to the patient (and family) who verbalized understanding & agreed to the treatment plan. Patient (family) encouraged to contact me/clinical staff with any questions/concerns      Return in about 6 months (around 08/11/2020) for DMII f/u AND as needed.    Signed,  Myer Haff, FNP  02/10/2020

## 2020-02-10 NOTE — Patient Instructions (Signed)
Pantoprazole tablets  Brand Name: Protonix  What is this medicine?  PANTOPRAZOLE (pan TOE pra zole) prevents the production of acid in the stomach. It is used to treat gastroesophageal reflux disease (GERD), inflammation of the esophagus, and Zollinger-Ellison syndrome.  How should I use this medicine?  Take this medicine by mouth. Swallow the tablets whole with a drink of water. Follow the directions on the prescription label. Do not crush, break, or chew. Take your medicine at regular intervals. Do not take your medicine more often than directed.  Talk to your pediatrician regarding the use of this medicine in children. While this drug may be prescribed for children as young as 5 years for selected conditions, precautions do apply.  What side effects may I notice from receiving this medicine?  Side effects that you should report to your doctor or health care professional as soon as possible:   allergic reactions like skin rash, itching or hives, swelling of the face, lips, or tongue   bone, muscle or joint pain   breathing problems   chest pain or chest tightness   dark yellow or brown urine   dizziness   fast, irregular heartbeat   feeling faint or lightheaded   fever or sore throat   muscle spasm   palpitations   rash on cheeks or arms that gets worse in the sun   redness, blistering, peeling or loosening of the skin, including inside the mouth   seizures   stomach polyps   tremors   unusual bleeding or bruising   unusually weak or tired   yellowing of the eyes or skin  Side effects that usually do not require medical attention (report to your doctor or health care professional if they continue or are bothersome):   constipation   diarrhea   dry mouth   headache   nausea  What may interact with this medicine?  Do not take this medicine with any of the following medications:   atazanavir   nelfinavir  This medicine may also interact with the following  medications:   ampicillin   delavirdine   erlotinib   iron salts   medicines for fungal infections like ketoconazole, itraconazole and voriconazole   methotrexate   mycophenolate mofetil   warfarin  What if I miss a dose?  If you miss a dose, take it as soon as you can. If it is almost time for your next dose, take only that dose. Do not take double or extra doses.  Where should I keep my medicine?  Keep out of the reach of children.  Store at room temperature between 15 and 30 degrees C (59 and 86 degrees F). Protect from light and moisture. Throw away any unused medicine after the expiration date.  What should I tell my health care provider before I take this medicine?  They need to know if you have any of these conditions:   liver disease   low levels of magnesium in the blood   lupus   an unusual or allergic reaction to omeprazole, lansoprazole, pantoprazole, rabeprazole, other medicines, foods, dyes, or preservatives   pregnant or trying to get pregnant   breast-feeding  What should I watch for while using this medicine?  It can take several days before your stomach pain gets better. Check with your doctor or health care professional if your condition does not start to get better, or if it gets worse.  You may need blood work done while you are taking this   medicine.  NOTE:This sheet is a summary. It may not cover all possible information. If you have questions about this medicine, talk to your doctor, pharmacist, or health care provider. Copyright 2020 [employer]        Metformin tablets  Brand Name: Glucophage  What is this medicine?  METFORMIN (met FOR min) is used to treat type 2 diabetes. It helps to control blood sugar. Treatment is combined with diet and exercise. This medicine can be used alone or with other medicines for diabetes.  How should I use this medicine?  Take this medicine by mouth. Take it with meals. Swallow the tablets with a drink of water. Follow the directions on the  prescription label. Take your medicine at regular intervals. Do not take your medicine more often than directed.  Talk to your pediatrician regarding the use of this medicine in children. While this drug may be prescribed for children as young as 89 years of age for selected conditions, precautions do apply.  What side effects may I notice from receiving this medicine?  Side effects that you should report to your doctor or health care professional as soon as possible:   allergic reactions like skin rash, itching or hives, swelling of the face, lips, or tongue   breathing problems   feeling faint or lightheaded, falls   muscle aches or pains   signs and symptoms of low blood sugar such as feeling anxious, confusion, dizziness, increased hunger, unusually weak or tired, sweating, shakiness, cold, irritable, headache, blurred vision, fast heartbeat, loss of consciousness   slow or irregular heartbeat   unusual stomach pain or discomfort   unusually tired or weak  Side effects that usually do not require medical attention (report to your doctor or health care professional if they continue or are bothersome):   diarrhea   headache   heartburn   metallic taste in mouth   nausea   stomach gas, upset  What may interact with this medicine?  Do not take this medicine with any of the following medications:   dofetilide   gatifloxacin   certain contrast medicines given before X-rays, CT scans, MRI, or other procedures  This medicine may also interact with the following medications:   acetazolamide   certain medicines for HIV infection or hepatitis, like adefovir, emtricitabine, entecavir, lamivudine, or tenofovir   cimetidine   crizotinib   digoxin   diuretics   female hormones, like estrogens or progestins and birth control pills   glycopyrrolate   isoniazid   lamotrigine   medicines for blood pressure, heart disease, irregular heart  beat   memantine   midodrine   methazolamide   morphine   nicotinic acid   phenothiazines like chlorpromazine, mesoridazine, prochlorperazine, thioridazine   phenytoin   procainamide   propantheline   quinidine   quinine   ranitidine   ranolazine   steroid medicines like prednisone or cortisone   stimulant medicines for attention disorders, weight loss, or to stay awake   thyroid medicines   topiramate   trimethoprim   trospium   vancomycin   vandetanib   zonisamide  What if I miss a dose?  If you miss a dose, take it as soon as you can. If it is almost time for your next dose, take only that dose. Do not take double or extra doses.  Where should I keep my medicine?  Keep out of the reach of children.  Store at room temperature between 15 and 30 degrees C (59  and 86 degrees F). Protect from moisture and light. Throw away any unused medicine after the expiration date.  What should I tell my health care provider before I take this medicine?  They need to know if you have any of these conditions:   anemia   frequently drink alcohol-containing beverages   become easily dehydrated   heart attack   heart failure that is treated with medications   kidney disease   liver disease   polycystic ovary syndrome   serious infection or injury   vomiting   an unusual or allergic reaction to metformin, other medicines, foods, dyes, or preservatives   pregnant or trying to get pregnant   breast-feeding  What should I watch for while using this medicine?  Visit your doctor or health care professional for regular checks on your progress.  A test called the HbA1C (A1C) will be monitored. This is a simple blood test. It measures your blood sugar control over the last 2 to 3 months. You will receive this test every 3 to 6 months.  Learn how to check your blood sugar. Learn the symptoms of low and high blood sugar and how to manage them.  Always carry a quick-source of sugar with you in case you have  symptoms of low blood sugar. Examples include hard sugar candy or glucose tablets. Make sure others know that you can choke if you eat or drink when you develop serious symptoms of low blood sugar, such as seizures or unconsciousness. They must get medical help at once.  Tell your doctor or health care professional if you have high blood sugar. You might need to change the dose of your medicine. If you are sick or exercising more than usual, you might need to change the dose of your medicine.  Do not skip meals. Ask your doctor or health care professional if you should avoid alcohol. Many nonprescription cough and cold products contain sugar or alcohol. These can affect blood sugar.  This medicine may cause ovulation in premenopausal women who do not have regular monthly periods. This may increase your chances of becoming pregnant. You should not take this medicine if you become pregnant or think you may be pregnant. Talk with your doctor or health care professional about your birth control options while taking this medicine. Contact your doctor or health care professional right away if think you are pregnant.  If you are going to need surgery, a MRI, CT scan, or other procedure, tell your doctor that you are taking this medicine. You may need to stop taking this medicine before the procedure.  Wear a medical ID bracelet or chain, and carry a card that describes your disease and details of your medicine and dosage times.  NOTE:This sheet is a summary. It may not cover all possible information. If you have questions about this medicine, talk to your doctor, pharmacist, or health care provider. Copyright 2020 [employer]        Glipizide tablets  Brand Name: Glucotrol  What is this medicine?  GLIPIZIDE (GLIP i zide) helps to treat type 2 diabetes. Treatment is combined with diet and exercise. The medicine helps your body to use insulin better.  How should I use this medicine?  Take this medicine by mouth. Swallow with a  drink of water. Do not take with food. Take it 30 minutes before a meal. Follow the directions on the prescription label. If you take this medicine once a day, take it 30 minutes before breakfast.  Take your doses at the same time each day. Do not take more often than directed.  Talk to your pediatrician regarding the use of this medicine in children. Special care may be needed.  Elderly patients over 63 years old may have a stronger reaction and need a smaller dose.  What side effects may I notice from receiving this medicine?  Side effects that you should report to your doctor or health care professional as soon as possible:   allergic reactions like skin rash, itching or hives, swelling of the face, lips, or tongue   breathing problems   dark urine   fever, chills, sore throat   signs and symptoms of low blood sugar such as feeling anxious, confusion, dizziness, increased hunger, unusually weak or tired, sweating, shakiness, cold, irritable, headache, blurred vision, fast heartbeat, loss of consciousness   unusual bleeding or bruising   yellowing of the eyes or skin  Side effects that usually do not require medical attention (report to your doctor or health care professional if they continue or are bothersome):   diarrhea   dizziness   headache   heartburn   nausea   stomach gas  What may interact with this medicine?   bosentan   chloramphenicol   cisapride   clarithromycin   medicines for fungal or yeast infections   metoclopramide   probenecid   warfarin  Many medications may cause an increase or decrease in blood sugar, these include:   alcohol containing beverages   aspirin and aspirin-like drugs   chloramphenicol   chromium   diuretics   female hormones, like estrogens or progestins and birth control pills   heart medicines   isoniazid   female hormones or anabolic steroids   medicines for weight loss   medicines for allergies, asthma, cold, or cough   medicines for mental  problems   medicines called MAO Inhibitors like Nardil, Parnate, Marplan, Eldepryl   niacin   NSAIDs, medicines for pain and inflammation, like ibuprofen or naproxen   pentamidine   phenytoin   probenecid   quinolone antibiotics like ciprofloxacin, levofloxacin, ofloxacin   some herbal dietary supplements   steroid medicines like prednisone or cortisone   thyroid medicine  What if I miss a dose?  If you miss a dose, take it as soon as you can. If it is almost time for your next dose, take only that dose. Do not take double or extra doses.  Where should I keep my medicine?  Keep out of the reach of children.  Store at room temperature below 30 degrees C (86 degrees F). Throw away any unused medicine after the expiration date.  What should I tell my health care provider before I take this medicine?  They need to know if you have any of these conditions:   diabetic ketoacidosis   glucose-6-phosphate dehydrogenase deficiency   heart disease   kidney disease   liver disease   porphyria   severe infection or injury   thyroid disease   an unusual or allergic reaction to glipizide, sulfa drugs, other medicines, foods, dyes, or preservatives   pregnant or trying to get pregnant   breast-feeding  What should I watch for while using this medicine?  Visit your doctor or health care professional for regular checks on your progress.  A test called the HbA1C (A1C) will be monitored. This is a simple blood test. It measures your blood sugar control over the last 2 to 3 months. You will receive  this test every 3 to 6 months.  Learn how to check your blood sugar. Learn the symptoms of low and high blood sugar and how to manage them.  Always carry a quick-source of sugar with you in case you have symptoms of low blood sugar. Examples include hard sugar candy or glucose tablets. Make sure others know that you can choke if you eat or drink when you develop serious symptoms of low blood sugar, such as seizures or  unconsciousness. They must get medical help at once.  Tell your doctor or health care professional if you have high blood sugar. You might need to change the dose of your medicine. If you are sick or exercising more than usual, you might need to change the dose of your medicine.  Do not skip meals. Ask your doctor or health care professional if you should avoid alcohol. Many nonprescription cough and cold products contain sugar or alcohol. These can affect blood sugar.  This medicine can make you more sensitive to the sun. Keep out of the sun. If you cannot avoid being in the sun, wear protective clothing and use sunscreen. Do not use sun lamps or tanning beds/booths.  Wear a medical ID bracelet or chain, and carry a card that describes your disease and details of your medicine and dosage times.  NOTE:This sheet is a summary. It may not cover all possible information. If you have questions about this medicine, talk to your doctor, pharmacist, or health care provider. Copyright 2020 [employer]        Back Pain (Acute or Chronic)    Back pain is one of the most common problems. The good news is that most people feel better in 1 to 2 weeks, and most of the rest in 1 to 2 months. Most people can remain active.  People who have paindescribe it differently--noteveryone is the same.   The pain can be sharp, stabbing, shooting, aching, cramping or burning.   Movement, standing, bending, lifting, sitting, or walking may worsen pain.   It can be limited to one spot or area, or it can be more generalized.   It can spread upwards, to the front, or go down your arms or legs (sciatica).   It can cause muscle spasm.  Most of the time, mechanical problems with the musclesor spine cause the pain. Mechanical problemsare usually caused by an injury to the muscles or ligaments. Illness can cause back pain, but it's usually not caused by a serious illness. Mechanical problems include:   Physical activity such as sports,  exercise, work, or normal activity   Overexertion, lifting, pushing, pulling incorrectly or too aggressively   Sudden twisting, bending, or stretching from an accident, or accidental movement   Poor posture   Stretching or moving wrong, without noticing pain at the time   Poor coordination, lack of regular exercise (check with your doctor about this)   Spinal disc disease or arthritis   Stress  Pain can also be related to pregnancy, or illness like appendicitis, bladder or kidney infections, pelvic infections, and many other things.  Acute back pain usually gets better in1 to 2 weeks. Back pain related to disk disease, arthritis in the spinal joints, or narrowing of the spinal canal (spinal stenosis) can become chronic and last for months or years.  Unless you had a physical injury such as a car accident or fall, X-rays are usually not needed for the first assessment of back pain. If pain continues and does not  respond to medical treatment, you may need X-rays and other tests.  Home care  Try this home care advice:   When in bed, tryto find a position of comfort. A firm mattress is best. Try lying flat on your back with pillows under your knees. You can also try lying on your side with your knees bent up toward your chest and a pillow between your knees.   At first, don't try to stretch out the sore spots. If there is a strain, it's not like the good soreness you get after exercising without an injury. In this case, stretching may make it worse.   Don't sit for long periods, as in a long car ride or during othertravel. This puts more stress on the lower back than standing or walking.   During the first 24 to 72 hours after an acute injury or flare up of chronic back pain, apply an ice pack to the painful area for 20 minutes and then remove it for 20 minutes. Do this over a period of 60 to 90 minutes or several times a day. This will reduce swelling and pain. Wrap the ice pack in a thin towel or plastic  to protect your skin.   You can start with ice, then switch to heat. Heat (hot shower, hot bath, or heating pad) reduces pain and works well for muscle spasms. Heat can be applied to the painful area for 20 minutes then remove it for 20 minutes. Do this over a period of 60 to 90 minutes or several times a day. Don't sleep on a heating pad. It can lead to skin burns or tissue damage.   You can alternate ice and heat therapy. Talk with your doctor aboutthe best treatment for your back pain.   Therapeutic massage can help relax the back muscles without stretching them.   Be aware of safe lifting methods and don't lift anything without stretching first.  Medicines  Talk to your doctor before using medicine, especially if you have other medical problems or are taking other medicines.   You may use over-the-counter medicine as directed on the bottle to control pain, unless another pain medicine was prescribed. If you have chronic conditions like diabetes, liver or kidney disease, stomach ulcers, or gastrointestinal bleeding, or are taking blood thinners, talk to your doctor before taking any medicine.   Be careful if you are given a prescription medicines, narcotics, or medicine for muscle spasms. They can cause drowsiness, affect your coordination, reflexes, and judgement. Don't drive or operate heavy machinery.  Follow-up care  Follow up with your healthcare provider, or as advised.  If X-rays were taken, you will be told of any new findings that may affect your care  Call 911  Call 911 if any of the following occur:   Trouble breathing   Confusion   Very drowsy or trouble awakening   Fainting or loss of consciousness   Rapid or very slow heart rate   Loss of bowel or bladder control  When to seek medical advice  Call your healthcare provider right away if any of these occur:   Pain becomes worse or spreads to your legs   Weakness or numbness in one or both legs   Numbness in the groin or genital  area  [employer] last reviewed this educational content on 06/03/2018   2000-2020 [employer], [place]. [address] [place], [place] [address]. All rights reserved. This information is not intended as a substitute for professional  medical care. Always follow your healthcare professional's instructions.

## 2020-02-10 NOTE — Progress Notes (Signed)
Have you seen any specialists/other providers since your last visit with [place]?    No      Arm preference verified?   Yes, no preference    Health Maintenance Due   Topic Date Due    FALLS RISK ANNUAL  Never done    Advance Directive on File  Never done    MAMMOGRAM  Never done    DM OPHTHALMOLOGY EXAM  Never done    DXA Scan  Never done    [facility] CARE PLAN LETTER  Never done    Medicare Annual Wellness Visit  Never done    Shingrix Vaccine 50+ (1) Never done    HEPATITIS C SCREENING  Never done    URINE MICROALBUMIN  07/30/2013    HEMOGLOBIN A1C ANNUAL  10/11/2014    PAP SMEAR  10/08/2015

## 2020-02-11 ENCOUNTER — Telehealth (INDEPENDENT_AMBULATORY_CARE_PROVIDER_SITE_OTHER): Payer: Self-pay | Admitting: Family Medicine

## 2020-02-11 NOTE — Telephone Encounter (Signed)
Patient has been notified

## 2020-02-11 NOTE — Telephone Encounter (Signed)
Pt called would like to know lab results done yesterday.   Cb# [phone]

## 2020-02-22 ENCOUNTER — Encounter (INDEPENDENT_AMBULATORY_CARE_PROVIDER_SITE_OTHER): Payer: Self-pay

## 2020-02-22 DIAGNOSIS — R9431 Abnormal electrocardiogram [ECG] [EKG]: Secondary | ICD-10-CM

## 2020-02-22 DIAGNOSIS — E7800 Pure hypercholesterolemia, unspecified: Secondary | ICD-10-CM

## 2020-02-22 DIAGNOSIS — I4510 Unspecified right bundle-branch block: Secondary | ICD-10-CM

## 2020-02-22 DIAGNOSIS — R079 Chest pain, unspecified: Secondary | ICD-10-CM | POA: Insufficient documentation

## 2020-02-22 DIAGNOSIS — R002 Palpitations: Secondary | ICD-10-CM

## 2020-02-23 NOTE — Progress Notes (Deleted)
[facility] OFF[facility] PROGRESS NOTE    HRT [facility] OFFICE      [facility] OFFICE -CARDIOLOGY  [address]  [place] [address]  Dept: [phone]  Dept Fax: [phone]       Patient Name: Helen [name]    Date of Visit:  February 23, 2020  Date of Birth: Sep 18, 1945  AGE: 74 y.o.  Medical Record #: [id number]  Requesting Physician: Dolan Amen, MD      CHIEF COMPLAINT: No chief complaint on file.      HISTORY OF PRESENT ILLNESS:    She is a pleasant 74 y.o. female who presents today for ***      PAST MEDICAL HISTORY: She has a past medical history of Anemia, Arthritis, Arthritis, Asthma, Asthma without status asthmaticus, Atrioventricular conduction disorder, Bilateral cataracts, Breast cancer, Breast lump, Cancer, Carcinoma, Chicken pox, Conduction Disorder, Diabetes mellitus type II, Echocardiogram (10/2013), Gastroesophageal reflux disease, Hemorrhoids without complication, Holter monitor (02/2014), Hyperlipidemia, Hypertensive disorder, Malignant neoplasm of breast, Measles, Mumps, Myocardial perfusion scan (11/2013, 12/2013), RBBB, and Syncope. She has a past surgical history that includes Breast biopsy; Hysterectomy; Tumor removal (Left); and ARTHROSCOPY, KNEE (Left, 04/21/2015).    ALLERGIES:   Allergies   Allergen Reactions    Aspirin      Break out in sweat and dizziness     Pollen Extract     Latex Itching and Rash       MEDICATIONS:   Current Outpatient Medications   Medication Sig    albuterol (PROVENTIL) (2.5 MG/3ML) 0.083% nebulizer solution Take 2.5 mg by nebulization every 6 (six) hours as needed.    amLODIPine (NORVASC) 10 MG tablet Take 1 tablet (10 mg total) by mouth daily    atorvastatin (LIPITOR) 40 MG tablet Take 40 mg by mouth daily.    Blood Glucose Monitoring Suppl (ONE TOUCH ULTRA 2) W/DEVICE KIT One daily    desonide (DESOWEN) 0.05 % cream 1 APPLICATION APPLY ON THE SKIN TWICE A DAY APPLY TO FACE TWICE DAILY.    diclofenac  Sodium (VOLTAREN) 1 % Gel topical gel Apply 2 g topically 4 (four) times daily    EPINEPHrine (EPIPEN 2-PAK) 0.3 MG/0.3ML Solution Auto-injector injection Inject 0.3 mLs (0.3 mg total) into the muscle once as needed (Anaphylaxis)    ferrous sulfate 325 (65 FE) MG tablet ferrous sulfate 325 mg (65 mg iron) tablet    Flovent HFA 110 MCG/ACT inhaler Inhale 1 puff into the lungs 2 (two) times daily    fluticasone (FLONASE) 50 MCG/ACT nasal spray     glipiZIDE (GLUCOTROL) 5 MG tablet Take 2 tablets (10 mg total) by mouth 2 (two) times daily before meals    glucose blood (ONE TOUCH ULTRA TEST) test strip 2 times daily    Lancets (ONETOUCH ULTRASOFT) lancets Use to check blood sugars 2 times daily    metFORMIN (GLUCOPHAGE) 1000 MG tablet Take 1 tablet (1,000 mg total) by mouth 2 (two) times daily    montelukast (SINGULAIR) 10 MG tablet     NASONEX 50 MCG/ACT nasal spray as needed.       pantoprazole (PROTONIX) 40 MG tablet Take 1 tablet (40 mg total) by mouth daily    Tradjenta 5 MG Tab TAKE 1 TABLET BY MOUTH EVERY DAY    valsartan-hydroCHLOROthiazide (DIOVAN-HCT) 160-12.5 MG per tablet Take 1 tablet by mouth daily        FAMILY HISTORY: family history includes Diabetes in her mother and sister.  SOCIAL HISTORY: She reports that she has never smoked. She has never used smokeless tobacco. She reports that she does not drink alcohol and does not use drugs.    PHYSICAL EXAMINATION    There were no vitals taken for this visit.    General Appearance:  A well-appearing female in no acute distress.    Skin: Warm and dry to touch, no apparent skin lesions, or masses noted.  Head: Normocephalic, normal hair pattern, no masses or tenderness   Eyes: EOMS Intact, PERRL, conjunctivae and lids unremarkable.  ENT: Ears, Nose and throat reveal no gross abnormalities.  No pallor or cyanosis.  Dentition good.   Neck: JVP normal, no carotid bruit, thyroid not enlarged   Chest: Clear to auscultation bilaterally with good air  movement and respiratory effort and no wheezes, rales, or rhonchi   Cardiovascular: Regular rhythm, S1 normal, S2 normal, No S3 or S4, Apical impulse not displaced. No murmurs. No gallops or rubs detected   Abdomen: Soft, nontender, nondistended, with normoactive bowel sounds. No organomegaly.  No pulsatile masses, or bruits.   Extremities: Warm without edema. No clubbing, or cyanosis. All peripheral pulses are full and equal.   Neuro: Alert and oriented x3. No gross motor or sensory deficits noted, affect appropriate.        ECG: ***    LABS:   No results found for: CBC  Lab Results   Component Value Date    AST 17 02/10/2020    ALT 15 02/10/2020     Lab Results   Component Value Date    LIPID 63 07/30/2012     Lab Results   Component Value Date    HGBA1C 7.4 (H) 02/10/2020    TSH 1.26 07/30/2012           IMPRESSION:   [name] is a 74 y.o. female with the following problems:    1. Hypertension, controlled.  2. Bifascicular block, unchanged and asymptomatic.    3.  Non-insulin-dependent diabetes  4. Normal stress MPI 2015  5. Minimal carotid artery plaquing by Doppler 2015  6. Echocardiogram 2006: Normal EF trace MR mild TR    RECOMMENDATIONS:  1. Continue current medications.    2. Follow up in one year.       RECOMMENDATIONS:    ***                                                     No orders of the defined types were placed in this encounter.      No orders of the defined types were placed in this encounter.      SIGNED:    Lonzo Cloud, MD          This note was generated by the Dragon speech recognition and may contain errors or omissions not intended by the user. Grammatical errors, random word insertions, deletions, pronoun errors, and incomplete sentences are occasional consequences of this technology due to software limitations. Not all errors are caught or corrected. If there are questions or concerns about the content of this note or information contained within the body of this dictation, they  should be addressed directly with the author for clarification.

## 2020-02-24 ENCOUNTER — Ambulatory Visit (INDEPENDENT_AMBULATORY_CARE_PROVIDER_SITE_OTHER): Payer: 59 | Admitting: Cardiovascular Disease

## 2020-03-11 ENCOUNTER — Encounter (INDEPENDENT_AMBULATORY_CARE_PROVIDER_SITE_OTHER): Payer: Self-pay | Admitting: Residents

## 2020-03-14 ENCOUNTER — Ambulatory Visit (INDEPENDENT_AMBULATORY_CARE_PROVIDER_SITE_OTHER): Payer: 59 | Admitting: Residents

## 2020-03-14 ENCOUNTER — Other Ambulatory Visit (INDEPENDENT_AMBULATORY_CARE_PROVIDER_SITE_OTHER): Payer: Self-pay | Admitting: Family Medicine

## 2020-03-14 MED ORDER — ATORVASTATIN CALCIUM 40 MG PO TABS
40.0000 mg | ORAL_TABLET | Freq: Every day | ORAL | 1 refills | Status: DC
Start: 2020-03-14 — End: 2020-07-26

## 2020-03-14 NOTE — Telephone Encounter (Signed)
Patient called in stating she needs a refill on her medication atorvastatin (LIPITOR) 40 MG tablet    [id number] - [place], [place] - [address] [place] [address] Phone:  [phone]   Fax:  [phone]        Last ov 02/2020

## 2020-03-27 ENCOUNTER — Other Ambulatory Visit: Payer: Self-pay | Admitting: Family Medicine

## 2020-03-28 NOTE — Telephone Encounter (Signed)
Ok to refill??  Last office visit 12/14/2019.  Last refill 07/20/2019.

## 2020-03-30 ENCOUNTER — Other Ambulatory Visit: Payer: Self-pay | Admitting: Family Medicine

## 2020-03-30 DIAGNOSIS — Z01419 Encounter for gynecological examination (general) (routine) without abnormal findings: Secondary | ICD-10-CM | POA: Diagnosis not present

## 2020-03-30 DIAGNOSIS — Z1231 Encounter for screening mammogram for malignant neoplasm of breast: Secondary | ICD-10-CM | POA: Diagnosis not present

## 2020-03-30 DIAGNOSIS — Z6828 Body mass index (BMI) 28.0-28.9, adult: Secondary | ICD-10-CM | POA: Diagnosis not present

## 2020-03-30 NOTE — Telephone Encounter (Signed)
Ok to refill??  Last office visit 12/14/2019.  Last refill 12/22/2018, #1 refill.

## 2020-04-14 ENCOUNTER — Ambulatory Visit (INDEPENDENT_AMBULATORY_CARE_PROVIDER_SITE_OTHER): Payer: 59 | Admitting: Residents

## 2020-04-25 ENCOUNTER — Other Ambulatory Visit (INDEPENDENT_AMBULATORY_CARE_PROVIDER_SITE_OTHER): Payer: Self-pay | Admitting: Family

## 2020-04-25 DIAGNOSIS — E119 Type 2 diabetes mellitus without complications: Secondary | ICD-10-CM

## 2020-05-11 ENCOUNTER — Ambulatory Visit (INDEPENDENT_AMBULATORY_CARE_PROVIDER_SITE_OTHER): Payer: Self-pay | Admitting: Cardiovascular Disease

## 2020-05-12 ENCOUNTER — Ambulatory Visit (INDEPENDENT_AMBULATORY_CARE_PROVIDER_SITE_OTHER): Payer: Medicare Other | Admitting: Family Medicine

## 2020-05-12 ENCOUNTER — Other Ambulatory Visit: Payer: Self-pay

## 2020-05-12 ENCOUNTER — Other Ambulatory Visit (INDEPENDENT_AMBULATORY_CARE_PROVIDER_SITE_OTHER): Payer: Self-pay | Admitting: Family Medicine

## 2020-05-12 VITALS — BP 124/80 | HR 78 | Temp 98.3°F | Ht 67.0 in | Wt 176.0 lb

## 2020-05-12 DIAGNOSIS — IMO0002 Reserved for concepts with insufficient information to code with codable children: Secondary | ICD-10-CM

## 2020-05-12 DIAGNOSIS — R5383 Other fatigue: Secondary | ICD-10-CM | POA: Diagnosis not present

## 2020-05-12 DIAGNOSIS — K120 Recurrent oral aphthae: Secondary | ICD-10-CM

## 2020-05-12 DIAGNOSIS — M5431 Sciatica, right side: Secondary | ICD-10-CM

## 2020-05-12 DIAGNOSIS — H6121 Impacted cerumen, right ear: Secondary | ICD-10-CM

## 2020-05-12 LAB — COMPLETE METABOLIC PANEL WITH GFR
AG Ratio: 1.8 (calc) (ref 1.0–2.5)
ALT: 17 U/L (ref 6–29)
AST: 23 U/L (ref 10–35)
Albumin: 4.2 g/dL (ref 3.6–5.1)
Alkaline phosphatase (APISO): 59 U/L (ref 37–153)
BUN/Creatinine Ratio: 20 (calc) (ref 6–22)
BUN: 22 mg/dL (ref 7–25)
CO2: 27 mmol/L (ref 20–32)
Calcium: 9.6 mg/dL (ref 8.6–10.4)
Chloride: 101 mmol/L (ref 98–110)
Creat: 1.1 mg/dL — ABNORMAL HIGH (ref 0.60–0.93)
GFR, Est African American: 58 mL/min/{1.73_m2} — ABNORMAL LOW (ref >=60)
GFR, Est Non African American: 50 mL/min/{1.73_m2} — ABNORMAL LOW (ref >=60)
Globulin: 2.3 g/dL (calc) (ref 1.9–3.7)
Glucose, Bld: 101 mg/dL — ABNORMAL HIGH (ref 65–99)
Potassium: 3.9 mmol/L (ref 3.5–5.3)
Sodium: 139 mmol/L (ref 135–146)
Total Bilirubin: 0.9 mg/dL (ref 0.2–1.2)
Total Protein: 6.5 g/dL (ref 6.1–8.1)

## 2020-05-12 LAB — CBC WITH DIFFERENTIAL/PLATELET
Absolute Monocytes: 456 cells/uL (ref 200–950)
Basophils Absolute: 18 cells/uL (ref 0–200)
Basophils Relative: 0.3 %
Eosinophils Absolute: 90 cells/uL (ref 15–500)
Eosinophils Relative: 1.5 %
HCT: 39 % (ref 35.0–45.0)
Hemoglobin: 13.2 g/dL (ref 11.7–15.5)
Lymphs Abs: 1884 cells/uL (ref 850–3900)
MCH: 31.7 pg (ref 27.0–33.0)
MCHC: 33.8 g/dL (ref 32.0–36.0)
MCV: 93.5 fL (ref 80.0–100.0)
MPV: 10.1 fL (ref 7.5–12.5)
Monocytes Relative: 7.6 %
Neutro Abs: 3552 cells/uL (ref 1500–7800)
Neutrophils Relative %: 59.2 %
Platelets: 223 10*3/uL (ref 140–400)
RBC: 4.17 10*6/uL (ref 3.80–5.10)
RDW: 11.6 % (ref 11.0–15.0)
Total Lymphocyte: 31.4 %
WBC: 6 10*3/uL (ref 3.8–10.8)

## 2020-05-12 LAB — TSH: TSH: 1.88 mIU/L (ref 0.40–4.50)

## 2020-05-12 LAB — VITAMIN B12: Vitamin B-12: 503 pg/mL (ref 200–1100)

## 2020-05-12 MED ORDER — TRIAMCINOLONE ACETONIDE 0.1 % MT PSTE: 1.0000 "application " | PASTE | Freq: Two times a day (BID) | OROMUCOSAL | 12 refills | Status: DC

## 2020-05-12 MED ORDER — PREDNISONE 20 MG PO TABS: ORAL_TABLET | ORAL | 0 refills | Status: DC

## 2020-05-12 MED ORDER — ONETOUCH ULTRASOFT LANCETS MISC
3 refills | Status: DC
Start: 2020-05-12 — End: 2020-12-12

## 2020-05-12 MED ORDER — ONETOUCH ULTRA VI STRP
ORAL_STRIP | 3 refills | Status: DC
Start: 2020-05-12 — End: 2020-12-12

## 2020-05-12 NOTE — Progress Notes (Signed)
Subjective:    Patient ID: April Beck, female    DOB: 05-26-1946, 74 y.o.   MRN: [id number]    Patient presents today with several concerns. First, she reports sciatica-like pain in her right leg for the last 2 months. She states that the pain begins in her posterior right hip and radiates down her right leg particularly at night all the way into her right foot. It hurts to lay on that side. She cannot get comfortable at night. She is not resting well. She denies any saddle anesthesia or bowel or bladder incontinence. She denies any leg weakness. She has minimal tenderness to palpation over the greater trochanteric bursa. Second, she reports fatigue. This is been for the last week. She states that she just does not feel well. She denies any cough. She denies any fever. She denies any chest pain or shortness of breath. She denies any nausea or vomiting. She denies any weight loss. Third, the patient reports sores in her mouth. There is a white ulcer-like lesion at the base of her tongue on the left side this approximately 2 mm in diameter. There is a similar one on the right gum near the second molar on the floor of her mouth. These appear to be canker sores. She states that they come and go. Fourth she reports hearing loss in her left ear. Exam reveals a cerumen impaction. Fifth she would like a flu shot. Six she has several moles that she would like evaluated. On exam she has a large 1.5 cm seborrheic keratosis in her right mid axillary line just above her bra strap. I reassured her that this is benign  Past Medical History:  Diagnosis Date  . 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 Allergy   . Anxiety    hx of panic attack  . Arthritis    hands & knees  . Galactose intolerance    Assymptomatic, but glucose in urine  . GERD (gastroesophageal reflux disease)   . Heart murmur    "small " per pt   . Hypercholesteremia   . Hypertension    off BP meds ~1 yr ago   . Hypothyroidism   .  Osteopenia   . Renal insufficiency 06/19/2012   Past Surgical History:  Procedure Laterality Date  . ABDOMINAL HYSTERECTOMY    . CHOLECYSTECTOMY  06/20/2012   Procedure: LAPAROSCOPIC CHOLECYSTECTOMY;  Surgeon: Zenovia Jarred, MD;  Location: [facility];  Service: General;  Laterality: N/A;  . COLONOSCOPY  11/22/2008  . [place] Bladder sling  09/2005   Dr. Jeffie Pollock   Current Outpatient Medications on File Prior to Visit  Medication Sig Dispense Refill  . acetaminophen (TYLENOL) 325 MG tablet Take 2 tablets (650 mg total) by mouth every 6 (six) hours as needed for pain.    [name] acyclovir (ZOVIRAX) 400 MG tablet Take 1 tablet (400 mg total) by mouth daily. 90 tablet 3  . atorvastatin (LIPITOR) 40 MG tablet Take 1 tablet (40 mg total) by mouth daily. 90 tablet 3  . citalopram (CELEXA) 10 MG tablet Take 1 tablet (10 mg total) by mouth daily. 90 tablet 3  . diazepam (VALIUM) 5 MG tablet TAKE 1 TABLET BY MOUTH AT BEDTIME AS NEEDED (TO RELAX MUSCLES). 30 tablet 1  . diphenoxylate-atropine (LOMOTIL) 2.5-0.025 MG tablet Take 2 tablets by mouth 4 (four) times daily as needed for diarrhea or loose stools. 30 tablet 0  . diphenoxylate-atropine (LOMOTIL) 2.5-0.025 MG tablet TAKE 2 TABLETS BY MOUTH 4 TIMES A DAY  AS NEEDED FOR DIARRHEA OR LOOSE STOOLS 30 tablet 0  . fluticasone (FLONASE) 50 MCG/ACT nasal spray Place 2 sprays into both nostrils daily. 16 g 6  . hydrochlorothiazide (HYDRODIURIL) 25 MG tablet Take 1 tablet (25 mg total) by mouth daily. 90 tablet 3  . Hydrocortisone Butyrate 0.1 % SOLN Apply 1 application topically 2 (two) times daily as needed. 60 mL 0  . levothyroxine (SYNTHROID) 50 MCG tablet Take 1 tablet (50 mcg total) by mouth daily. 90 tablet 3  . meloxicam (MOBIC) 15 MG tablet Take 1 tablet (15 mg total) by mouth daily. 90 tablet 2  . pantoprazole (PROTONIX) 40 MG tablet Take 1 tablet (40 mg total) by mouth 2 (two) times daily. 180 tablet 3  . potassium chloride SA (KLOR-CON) 20 MEQ tablet Take 1  tablet (20 mEq total) by mouth daily. 90 tablet 3   No current facility-administered medications on file prior to visit.   No Known Allergies Social History   Socioeconomic History  . Marital status: Divorced    Spouse name: Not on file  . Number of children: Not on file  . Years of education: Not on file  . Highest education level: Not on file  Occupational History  . Not on file  Tobacco Use  . Smoking status: Never Smoker  . Smokeless tobacco: Never Used  Vaping Use  . Vaping Use: Former  Substance and Sexual Activity  . Alcohol use: No  . Drug use: No  . Sexual activity: Not on file  Other Topics Concern  . Not on file  Social History Narrative  . Not on file   Social Determinants of Health   Financial Resource Strain:   . Difficulty of Paying Living Expenses: Not on file  Food Insecurity:   . Worried About Charity fundraiser in the Last Year: Not on file  . Ran Out of Food in the Last Year: Not on file  Transportation Needs:   . Lack of Transportation (Medical): Not on file  . Lack of Transportation (Non-Medical): Not on file  Physical Activity:   . Days of Exercise per Week: Not on file  . Minutes of Exercise per Session: Not on file  Stress:   . Feeling of Stress : Not on file  Social Connections:   . Frequency of Communication with Friends and Family: Not on file  . Frequency of Social Gatherings with Friends and Family: Not on file  . Attends Religious Services: Not on file  . Active Member of Clubs or Organizations: Not on file  . Attends Archivist Meetings: Not on file  . Marital Status: Not on file  Intimate Partner Violence:   . Fear of Current or Ex-Partner: Not on file  . Emotionally Abused: Not on file  . Physically Abused: Not on file  . Sexually Abused: Not on file       Review of Systems  All other systems reviewed and are negative.      Objective:   Physical Exam Vitals reviewed.  Constitutional:      General: She  is not in acute distress.    Appearance: She is well-developed. She is not diaphoretic.  HENT:     Head: Normocephalic and atraumatic.     Right Ear: External ear normal.     Left Ear: External ear normal.     Nose: Nose normal.     Mouth/Throat:     Pharynx: No oropharyngeal exudate.   Eyes:  General: No scleral icterus.       Right eye: No discharge.        Left eye: No discharge.     Conjunctiva/sclera: Conjunctivae normal.     Pupils: Pupils are equal, round, and reactive to light.  Neck:     Thyroid: No thyromegaly.     Vascular: No JVD.     Trachea: No tracheal deviation.  Cardiovascular:     Rate and Rhythm: Normal rate and regular rhythm.     Heart sounds: Normal heart sounds. No murmur heard.  No friction rub. No gallop.   Pulmonary:     Effort: Pulmonary effort is normal. No respiratory distress.     Breath sounds: Normal breath sounds. No stridor. No wheezing or rales.  Chest:     Chest wall: No tenderness.  Abdominal:     General: Bowel sounds are normal. There is no distension.     Palpations: Abdomen is soft. There is no mass.     Tenderness: There is no abdominal tenderness. There is no guarding or rebound.  Musculoskeletal:        General: No tenderness or deformity.     Cervical back: Normal range of motion and neck supple.       Legs:  Lymphadenopathy:     Cervical: No cervical adenopathy.  Skin:    General: Skin is warm.     Coloration: Skin is not pale.     Findings: No erythema or rash.  Neurological:     Mental Status: She is alert and oriented to person, place, and time.     Cranial Nerves: No cranial nerve deficit.     Motor: No abnormal muscle tone.     Coordination: Coordination normal.     Deep Tendon Reflexes: Reflexes are normal and symmetric.  Psychiatric:        Behavior: Behavior normal.        Thought Content: Thought content normal.        Judgment: Judgment normal.           Assessment & Plan:  Other fatigue - Plan:  CBC with Differential/Platelet, COMPLETE METABOLIC PANEL WITH GFR, Vitamin B12, TSH  Right sided sciatica  Aphthous ulcer  Impacted cerumen of right ear  We will treat the aphthous ulcers in the mouth with triamcinolone paste 0.1% applied twice daily until healed. Recheck if no better in 1 week. We will treat the sciatica with a prednisone taper pack. If not better obtain an x-ray of the lumbar spine. We will treat the fatigue by obtaining a CBC, CMP, TSH, B12 level. However I suspect the fatigue may be due to the fact the patient's not resting well due to the pain in her posterior right hip and down the right side of her leg she states that she is not sleeping well due to the pain. Cerumen impaction was removed with irrigation and lavage. Reassured the patient that the "mole" on her right side was a seborrheic keratosis. This does not require biopsy.

## 2020-05-12 NOTE — Telephone Encounter (Signed)
Pt called - requesting refill for One touch lancet and test strips to be send to local pharmacy on file.

## 2020-05-13 ENCOUNTER — Other Ambulatory Visit: Payer: Self-pay | Admitting: Family Medicine

## 2020-05-17 ENCOUNTER — Encounter: Payer: Self-pay | Admitting: Family Medicine

## 2020-05-17 ENCOUNTER — Ambulatory Visit (INDEPENDENT_AMBULATORY_CARE_PROVIDER_SITE_OTHER): Payer: 59 | Admitting: Residents

## 2020-05-25 ENCOUNTER — Encounter (INDEPENDENT_AMBULATORY_CARE_PROVIDER_SITE_OTHER): Payer: Self-pay | Admitting: Family Medicine

## 2020-05-30 ENCOUNTER — Other Ambulatory Visit (INDEPENDENT_AMBULATORY_CARE_PROVIDER_SITE_OTHER): Payer: Self-pay | Admitting: Family

## 2020-05-30 DIAGNOSIS — E119 Type 2 diabetes mellitus without complications: Secondary | ICD-10-CM

## 2020-06-07 ENCOUNTER — Telehealth: Payer: Self-pay | Admitting: Family Medicine

## 2020-06-07 ENCOUNTER — Other Ambulatory Visit: Payer: Self-pay | Admitting: Hematology & Oncology

## 2020-06-07 DIAGNOSIS — Z23 Encounter for immunization: Secondary | ICD-10-CM | POA: Diagnosis not present

## 2020-06-07 NOTE — Telephone Encounter (Signed)
CB [phone] Pt still having back pain she has taken the medication Dr.Pickard prescribe.

## 2020-06-08 ENCOUNTER — Encounter: Payer: Self-pay | Admitting: Family Medicine

## 2020-06-09 ENCOUNTER — Other Ambulatory Visit: Payer: Self-pay | Admitting: Family Medicine

## 2020-06-09 MED ORDER — PREDNISONE 20 MG PO TABS: ORAL_TABLET | ORAL | 0 refills | Status: DC

## 2020-06-09 NOTE — Telephone Encounter (Signed)
Please see MyChart messages

## 2020-06-29 ENCOUNTER — Encounter: Payer: Self-pay | Admitting: Family Medicine

## 2020-06-29 DIAGNOSIS — Z23 Encounter for immunization: Secondary | ICD-10-CM | POA: Diagnosis not present

## 2020-07-12 ENCOUNTER — Encounter: Payer: Self-pay | Admitting: Family Medicine

## 2020-07-12 ENCOUNTER — Ambulatory Visit (INDEPENDENT_AMBULATORY_CARE_PROVIDER_SITE_OTHER): Payer: 59 | Admitting: Family

## 2020-07-12 ENCOUNTER — Encounter (INDEPENDENT_AMBULATORY_CARE_PROVIDER_SITE_OTHER): Payer: Self-pay | Admitting: Family

## 2020-07-12 VITALS — BP 147/76 | HR 77 | Temp 97.8°F | Ht 61.0 in | Wt 156.4 lb

## 2020-07-12 DIAGNOSIS — E1165 Type 2 diabetes mellitus with hyperglycemia: Secondary | ICD-10-CM

## 2020-07-12 DIAGNOSIS — I10 Essential (primary) hypertension: Secondary | ICD-10-CM

## 2020-07-12 NOTE — Progress Notes (Signed)
Have you seen any specialists/other providers since your last visit with [place]?    No    Do you agree to telemedicine visit?  No    Arm preference verified?   Yes, no preference    Health Maintenance Due   Topic Date Due    Advance Directive on File  Never done    MAMMOGRAM  Never done    DM OPHTHALMOLOGY EXAM  Never done    DXA Scan  Never done    PCMH CARE PLAN LETTER  Never done    Medicare Annual Wellness Visit  Never done    Shingrix Vaccine 50+ (1) Never done    HEPATITIS C SCREENING  Never done    PAP SMEAR  10/08/2015    INFLUENZA VACCINE  04/03/2020

## 2020-07-12 NOTE — Progress Notes (Signed)
Primary Care  Laurell Josephs  PROGRESS NOTE      Patient: Helen [name]   Date: 07/13/2020   MRN: [id number]     Helen [name] is a 74 y.o. female    Chief Complaint   Patient presents with    Blood Pressure Check     BP elevated last 3 days, tchecking at home       MEDICATIONS     Current Outpatient Medications   Medication Sig Dispense Refill    albuterol (PROVENTIL) (2.5 MG/3ML) 0.083% nebulizer solution Take 2.5 mg by nebulization every 6 (six) hours as needed.      amLODIPine (NORVASC) 10 MG tablet Take 1 tablet (10 mg total) by mouth daily 90 tablet 3    atorvastatin (LIPITOR) 40 MG tablet Take 1 tablet (40 mg total) by mouth daily 90 tablet 1    Blood Glucose Monitoring Suppl (ONE TOUCH ULTRA 2) W/DEVICE KIT One daily 1 each 0    desonide (DESOWEN) 0.05 % cream 1 APPLICATION APPLY ON THE SKIN TWICE A DAY APPLY TO FACE TWICE DAILY.      diclofenac Sodium (VOLTAREN) 1 % Gel topical gel Apply 2 g topically 4 (four) times daily 1 Tube 3    EPINEPHrine (EPIPEN 2-PAK) 0.3 MG/0.3ML Solution Auto-injector injection Inject 0.3 mLs (0.3 mg total) into the muscle once as needed (Anaphylaxis) 1 each 0    Flovent HFA 110 MCG/ACT inhaler Inhale 1 puff into the lungs 2 (two) times daily      fluticasone (FLONASE) 50 MCG/ACT nasal spray       glipiZIDE (GLUCOTROL) 5 MG tablet TAKE 2 TABLETS (10 MG TOTAL) BY MOUTH 2 (TWO) TIMES DAILY BEFORE MEALS 360 tablet 1    glucose blood (OneTouch Ultra) test strip 2 times daily 200 each 3    Lancets (onetouch ultrasoft) lancets Use to check blood sugars 2 times daily 200 each 3    metFORMIN (GLUCOPHAGE) 1000 MG tablet TAKE 1 TABLET BY MOUTH TWICE A DAY 180 tablet 1    Tradjenta 5 MG Tab TAKE 1 TABLET BY MOUTH EVERY DAY 90 tablet 1    valsartan-hydroCHLOROthiazide (DIOVAN-HCT) 160-12.5 MG per tablet Take 1 tablet by mouth daily 90 tablet 3    ferrous sulfate 325 (65 FE) MG tablet ferrous sulfate 325 mg (65 mg iron) tablet      montelukast (SINGULAIR) 10 MG tablet        NASONEX 50 MCG/ACT nasal spray as needed.     5     Current Facility-Administered Medications   Medication Dose Route Frequency Provider Last Rate Last Admin    triamcinolone acetonide (KENALOG-40) 40 MG/ML injection 80 mg  80 mg Intra-articular Once Hallal, Nadim L, MD           Allergies   Allergen Reactions    Aspirin      Break out in sweat and dizziness     Pollen Extract     Latex Itching and Rash       SUBJECTIVE     Chief Complaint   Patient presents with    Blood Pressure Check     BP elevated last 3 days, tchecking at home        HPI   Pt following up on her HTN. States over the past 3 days, BP has been in 150s/80 where historically it has been stable. Denies any changes in her diet, lifestyle or new life stressors. Denies CP, SOB, HA, dizziness. Has appt coming  up with Cardiology early Dec.     Also do for A1C next month, was 7.4 in June. She denies any symptoms of DM. Compliant with meds, takes metformin 1000mg  BID. Reasonable compliance with diet.    ROS     Review of Systems   Constitutional: Negative for chills, fatigue and fever.   Respiratory: Positive for shortness of breath (denies change from baseline). Negative for cough and wheezing.    Cardiovascular: Negative for chest pain, palpitations and leg swelling.   Gastrointestinal: Negative for abdominal pain, diarrhea, nausea and vomiting.   Endocrine: Negative for polydipsia, polyphagia and polyuria.   Neurological: Negative for dizziness, syncope, weakness, numbness and headaches.         The following portions of the patient's history were reviewed and updated as appropriate: Allergies, Current Medications, Past Family History, Past Medical history, Past social history, Past surgical history, and Problem List.    PHYSICAL EXAM     Vitals:    07/12/20 1009 07/12/20 1011 07/12/20 1100 07/12/20 1101   BP: 144/67 127/67 152/84 147/76   BP Site: Right arm Right arm Left arm Left arm   Patient Position: Sitting Sitting Sitting Sitting   Cuff  Size: Medium Medium  Medium   Pulse: 80  82 77   Temp: 97.8 F (36.6 C)      TempSrc: Temporal      SpO2: 96%      Weight:  70.9 kg (156 lb 6.4 oz)     Height:  1.549 m (5\' 1" )         Results for orders placed or performed in visit on 02/10/20   Comprehensive metabolic panel   Result Value Ref Range    Glucose 127 (H) 70 - 100 mg/dL    BUN 54.0 7.0 - 98.1 mg/dL    Creatinine 0.9 0.4 - 1.5 mg/dL    Sodium 191 478 - 295 mEq/L    Potassium 4.2 3.5 - 5.1 mEq/L    Chloride 106 100 - 111 mEq/L    CO2 25 21 - 29 mEq/L    Calcium 9.7 7.9 - 10.2 mg/dL    Protein, Total 7.2 6.0 - 8.3 g/dL    Albumin 3.7 3.5 - 5.0 g/dL    AST (SGOT) 17 5 - 34 U/L    ALT 15 0 - 55 U/L    Alkaline Phosphatase 62 37 - 106 U/L    Bilirubin, Total 0.9 0.2 - 1.2 mg/dL    Globulin 3.5 2.0 - 3.7 g/dL    Albumin/Globulin Ratio 1.1 0.9 - 2.2    Anion Gap 9.0 5.0 - 15.0   Lipid panel   Result Value Ref Range    Cholesterol 134 0 - 199 mg/dL    Triglycerides 50 34 - 149 mg/dL    HDL 59 40 - 6,213 mg/dL    LDL Calculated 65 0 - 99 mg/dL    VLDL Calculated 10 10 - 40 mg/dL    Cholesterol / HDL Ratio 2.3 See Below   CBC and differential   Result Value Ref Range    WBC 6.22 3.10 - 9.50 x10 3/uL    Hgb 10.5 (L) 11.4 - 14.8 g/dL    Hematocrit 08.6 (L) 34.7 - 43.7 %    Platelets 240 142 - 346 x10 3/uL    RBC 4.52 3.90 - 5.10 x10 6/uL    MCV 75.2 (L) 78.0 - 96.0 fL    MCH 23.2 (L) 25.1 - 33.5 pg  MCHC 30.9 (L) 31.5 - 35.8 g/dL    RDW 14 11 - 15 %    MPV 10.6 8.9 - 12.5 fL    Neutrophils 48.3 None %    Lymphocytes Automated 36.0 None %    Monocytes 13.0 None %    Eosinophils Automated 1.3 None %    Basophils Automated 1.1 None %    Immature Granulocytes 0.3 None %    Nucleated RBC 0.0 0.0 - 0.0 /100 WBC    Neutrophils Absolute 3.00 1.10 - 6.33 x10 3/uL    Lymphocytes Absolute Automated 2.24 0.42 - 3.22 x10 3/uL    Monocytes Absolute Automated 0.81 0.21 - 0.85 x10 3/uL    Eosinophils Absolute Automated 0.08 0.00 - 0.44 x10 3/uL    Basophils Absolute Automated  0.07 0.00 - 0.08 x10 3/uL    Immature Granulocytes Absolute 0.02 0.00 - 0.07 x10 3/uL    Absolute NRBC 0.00 0.00 - 0.00 x10 3/uL   Hemoglobin A1C   Result Value Ref Range    Hemoglobin A1C 7.4 (H) 4.6 - 5.9 %    Average Estimated Glucose 165.7 mg/dL   Urine Microalbumin Random   Result Value Ref Range    Urine Microalbumin, Random 28.0 0.0 - 30.0    Urine Creatinine, Random 141.5 None Established mg/dL    Urine Microalbumin/Creatinine Ratio 20 0 - 30 ug/mg   Hemolysis index   Result Value Ref Range    Hemolysis Index 5 0 - 18   GFR   Result Value Ref Range    EGFR >60.0        Physical Exam  Vitals and nursing note reviewed.   Constitutional:       General: She is awake. She is not in acute distress.     Appearance: Normal appearance.   Cardiovascular:      Rate and Rhythm: Normal rate and regular rhythm.      Heart sounds: Normal heart sounds.   Pulmonary:      Effort: Pulmonary effort is normal.      Breath sounds: Normal breath sounds and air entry.   Neurological:      Mental Status: She is alert and oriented to person, place, and time.   Psychiatric:         Mood and Affect: Mood normal.         Speech: Speech normal.         Behavior: Behavior normal.             ASSESSMENT/PLAN        1. Essential hypertension  Chronic, elevated. Historically well controlled until the past week. Pt wants to continue to monitor for 1-2 weeks prior to adding/ changing medication. Will f/u if it remains elevated. Continue with heart health diet, low sodium.     ER for any CP, SOB, dizziness, severe HA, ect.      2. Uncontrolled type 2 diabetes mellitus with hyperglycemia  Hemoglobin A1C  Chronic, improving. Will get 6 month A1C checked in Dec.      Reviewed med use and side effects. Reviewed s/s that would warrant further and/ or immediate medical attention. Pt in agreement with plan and all questions answered.       Risk & Benefits of the new medication(s) were explained to the patient (and family) who verbalized understanding  & agreed to the treatment plan. Patient (family) encouraged to contact me/clinical staff with any questions/concerns      Return if symptoms worsen  or fail to improve.    Signed,  Myer Haff, FNP, FNP  07/13/2020

## 2020-07-13 ENCOUNTER — Other Ambulatory Visit: Payer: Self-pay

## 2020-07-13 MED ORDER — PANTOPRAZOLE SODIUM 40 MG PO TBEC
40.0000 mg | DELAYED_RELEASE_TABLET | Freq: Two times a day (BID) | ORAL | 3 refills | Status: DC
Start: 2020-07-13 — End: 2020-07-15

## 2020-07-13 MED ORDER — CITALOPRAM HYDROBROMIDE 10 MG PO TABS
10.0000 mg | ORAL_TABLET | Freq: Every day | ORAL | 3 refills | Status: DC
Start: 2020-07-13 — End: 2020-07-15

## 2020-07-13 MED ORDER — ATORVASTATIN CALCIUM 40 MG PO TABS
40.0000 mg | ORAL_TABLET | Freq: Every day | ORAL | 3 refills | Status: DC
Start: 2020-07-13 — End: 2020-07-15

## 2020-07-13 MED ORDER — LEVOTHYROXINE SODIUM 50 MCG PO TABS
50.0000 ug | ORAL_TABLET | Freq: Every day | ORAL | 3 refills | Status: DC
Start: 2020-07-13 — End: 2020-07-15

## 2020-07-13 MED ORDER — HYDROCHLOROTHIAZIDE 25 MG PO TABS: 25.0000 mg | ORAL_TABLET | Freq: Every day | ORAL | 3 refills | Status: DC

## 2020-07-13 MED ORDER — ACYCLOVIR 400 MG PO TABS
400.0000 mg | ORAL_TABLET | Freq: Every day | ORAL | 3 refills | Status: DC
Start: 2020-07-13 — End: 2020-07-15

## 2020-07-14 ENCOUNTER — Other Ambulatory Visit: Payer: Self-pay

## 2020-07-14 ENCOUNTER — Other Ambulatory Visit (INDEPENDENT_AMBULATORY_CARE_PROVIDER_SITE_OTHER): Payer: Self-pay | Admitting: Family

## 2020-07-14 LAB — HEMOGLOBIN A1C: Hemoglobin A1C: 7.6 % of total Hgb — ABNORMAL HIGH (ref <5.7)

## 2020-07-15 ENCOUNTER — Other Ambulatory Visit: Payer: Self-pay | Admitting: *Deleted

## 2020-07-15 ENCOUNTER — Other Ambulatory Visit (INDEPENDENT_AMBULATORY_CARE_PROVIDER_SITE_OTHER): Payer: Self-pay | Admitting: Family

## 2020-07-15 DIAGNOSIS — E1165 Type 2 diabetes mellitus with hyperglycemia: Secondary | ICD-10-CM

## 2020-07-15 MED ORDER — ACYCLOVIR 400 MG PO TABS
400.0000 mg | ORAL_TABLET | Freq: Every day | ORAL | 3 refills | Status: DC
Start: 2020-07-15 — End: 2020-07-21

## 2020-07-15 MED ORDER — HYDROCHLOROTHIAZIDE 25 MG PO TABS
25.0000 mg | ORAL_TABLET | Freq: Every day | ORAL | 3 refills | Status: DC
Start: 2020-07-15 — End: 2021-06-02

## 2020-07-15 MED ORDER — ATORVASTATIN CALCIUM 40 MG PO TABS
40.0000 mg | ORAL_TABLET | Freq: Every day | ORAL | 3 refills | Status: DC
Start: 2020-07-15 — End: 2020-07-21

## 2020-07-15 MED ORDER — PANTOPRAZOLE SODIUM 40 MG PO TBEC
40.0000 mg | DELAYED_RELEASE_TABLET | Freq: Two times a day (BID) | ORAL | 3 refills | Status: DC
Start: 2020-07-15 — End: 2020-07-20

## 2020-07-15 MED ORDER — LEVOTHYROXINE SODIUM 50 MCG PO TABS
50.0000 ug | ORAL_TABLET | Freq: Every day | ORAL | 3 refills | Status: DC
Start: 2020-07-15 — End: 2020-07-20

## 2020-07-15 MED ORDER — CITALOPRAM HYDROBROMIDE 10 MG PO TABS
10.0000 mg | ORAL_TABLET | Freq: Every day | ORAL | 3 refills | Status: DC
Start: 2020-07-15 — End: 2020-07-21

## 2020-07-19 ENCOUNTER — Other Ambulatory Visit: Payer: Self-pay | Admitting: Family Medicine

## 2020-07-20 ENCOUNTER — Other Ambulatory Visit: Payer: Self-pay

## 2020-07-20 MED ORDER — LEVOTHYROXINE SODIUM 50 MCG PO TABS: 50.0000 ug | ORAL_TABLET | Freq: Every day | ORAL | 3 refills | Status: DC

## 2020-07-20 MED ORDER — PANTOPRAZOLE SODIUM 40 MG PO TBEC
40.0000 mg | DELAYED_RELEASE_TABLET | Freq: Two times a day (BID) | ORAL | 3 refills | Status: DC
Start: 2020-07-20 — End: 2021-01-17

## 2020-07-21 ENCOUNTER — Other Ambulatory Visit: Payer: Self-pay

## 2020-07-21 MED ORDER — ATORVASTATIN CALCIUM 40 MG PO TABS
40.0000 mg | ORAL_TABLET | Freq: Every day | ORAL | 3 refills | Status: DC
Start: 2020-07-21 — End: 2021-06-26

## 2020-07-21 MED ORDER — ACYCLOVIR 400 MG PO TABS
400.0000 mg | ORAL_TABLET | Freq: Every day | ORAL | 3 refills | Status: DC
Start: 2020-07-21 — End: 2021-06-26

## 2020-07-21 MED ORDER — CITALOPRAM HYDROBROMIDE 10 MG PO TABS
10.0000 mg | ORAL_TABLET | Freq: Every day | ORAL | 3 refills | Status: DC
Start: 2020-07-21 — End: 2021-06-26

## 2020-07-22 ENCOUNTER — Telehealth (INDEPENDENT_AMBULATORY_CARE_PROVIDER_SITE_OTHER): Payer: Self-pay | Admitting: Family Medicine

## 2020-07-22 NOTE — Telephone Encounter (Signed)
Needs appointment

## 2020-07-22 NOTE — Telephone Encounter (Signed)
Pt called would like an xr order for back pain

## 2020-07-22 NOTE — Telephone Encounter (Signed)
[facility] TO SCHEDULE APPT

## 2020-07-25 ENCOUNTER — Telehealth: Payer: Self-pay

## 2020-07-25 ENCOUNTER — Other Ambulatory Visit: Payer: Self-pay

## 2020-07-25 ENCOUNTER — Encounter: Payer: Self-pay | Admitting: Family Medicine

## 2020-07-25 ENCOUNTER — Other Ambulatory Visit (INDEPENDENT_AMBULATORY_CARE_PROVIDER_SITE_OTHER): Payer: Self-pay | Admitting: Family

## 2020-07-25 DIAGNOSIS — M5431 Sciatica, right side: Secondary | ICD-10-CM

## 2020-07-25 NOTE — Telephone Encounter (Signed)
MRI placed for PT at [facility]

## 2020-07-31 ENCOUNTER — Encounter: Payer: Self-pay | Admitting: Family Medicine

## 2020-08-02 ENCOUNTER — Telehealth: Payer: Self-pay

## 2020-08-02 ENCOUNTER — Encounter (INDEPENDENT_AMBULATORY_CARE_PROVIDER_SITE_OTHER): Payer: Self-pay | Admitting: Family

## 2020-08-02 ENCOUNTER — Other Ambulatory Visit (INDEPENDENT_AMBULATORY_CARE_PROVIDER_SITE_OTHER): Payer: Self-pay | Admitting: Family Medicine

## 2020-08-02 ENCOUNTER — Encounter (INDEPENDENT_AMBULATORY_CARE_PROVIDER_SITE_OTHER): Payer: Self-pay | Admitting: Family Medicine

## 2020-08-02 ENCOUNTER — Ambulatory Visit (INDEPENDENT_AMBULATORY_CARE_PROVIDER_SITE_OTHER): Payer: 59 | Admitting: Family

## 2020-08-02 VITALS — BP 128/67 | HR 79 | Temp 97.1°F | Ht 60.8 in | Wt 158.8 lb

## 2020-08-02 DIAGNOSIS — G8929 Other chronic pain: Secondary | ICD-10-CM

## 2020-08-02 DIAGNOSIS — M545 Low back pain, unspecified: Secondary | ICD-10-CM

## 2020-08-02 MED ORDER — GLIPIZIDE 5 MG PO TABS
5.0000 mg | ORAL_TABLET | Freq: Two times a day (BID) | ORAL | 1 refills | Status: DC
Start: 2020-08-02 — End: 2020-10-12

## 2020-08-02 MED ORDER — TRADJENTA 5 MG PO TABS
1.0000 | ORAL_TABLET | Freq: Every day | ORAL | 1 refills | Status: DC
Start: 2020-08-02 — End: 2020-10-12

## 2020-08-02 NOTE — Telephone Encounter (Signed)
Pt called she currently has cold sores in her mouth.

## 2020-08-02 NOTE — Progress Notes (Signed)
Primary Care  Laurell Josephs  PROGRESS NOTE      Patient: Helen [name]   Date: 08/03/2020   MRN: [id number]     Helen [name] is a 74 y.o. female    Chief Complaint   Patient presents with    Back Pain     lower right side, sharp, fall 6 months ago, wants x-ray, pain scale 8/10, takes advil which helps but pain doesn't go away completely       MEDICATIONS     Current Outpatient Medications   Medication Sig Dispense Refill    albuterol (PROVENTIL) (2.5 MG/3ML) 0.083% nebulizer solution Take 2.5 mg by nebulization every 6 (six) hours as needed.      amLODIPine (NORVASC) 10 MG tablet Take 1 tablet (10 mg total) by mouth daily 90 tablet 3    atorvastatin (LIPITOR) 40 MG tablet TAKE 1 TABLET BY MOUTH EVERY DAY 90 tablet 1    Blood Glucose Monitoring Suppl (ONE TOUCH ULTRA 2) W/DEVICE KIT One daily 1 each 0    desonide (DESOWEN) 0.05 % cream 1 APPLICATION APPLY ON THE SKIN TWICE A DAY APPLY TO FACE TWICE DAILY.      diclofenac Sodium (VOLTAREN) 1 % Gel topical gel Apply 2 g topically 4 (four) times daily 1 Tube 3    EPINEPHrine (EPIPEN 2-PAK) 0.3 MG/0.3ML Solution Auto-injector injection Inject 0.3 mLs (0.3 mg total) into the muscle once as needed (Anaphylaxis) 1 each 0    ferrous sulfate 325 (65 FE) MG tablet ferrous sulfate 325 mg (65 mg iron) tablet      Flovent HFA 110 MCG/ACT inhaler Inhale 1 puff into the lungs 2 (two) times daily      fluticasone (FLONASE) 50 MCG/ACT nasal spray       glucose blood (OneTouch Ultra) test strip 2 times daily 200 each 3    Lancets (onetouch ultrasoft) lancets Use to check blood sugars 2 times daily 200 each 3    metFORMIN (GLUCOPHAGE) 1000 MG tablet TAKE 1 TABLET BY MOUTH TWICE A DAY 180 tablet 1    montelukast (SINGULAIR) 10 MG tablet       NASONEX 50 MCG/ACT nasal spray as needed.     5    pantoprazole (PROTONIX) 40 MG tablet       valsartan-hydroCHLOROthiazide (DIOVAN-HCT) 160-12.5 MG per tablet Take 1 tablet by mouth daily 90 tablet 3    glipiZIDE (GLUCOTROL) 5  MG tablet Take 1 tablet (5 mg total) by mouth 2 (two) times daily before meals 180 tablet 1    linaGLIPtin (Tradjenta) 5 MG Tab Take 1 tablet (5 mg total) by mouth daily 90 tablet 1     Current Facility-Administered Medications   Medication Dose Route Frequency Provider Last Rate Last Admin    triamcinolone acetonide (KENALOG-40) 40 MG/ML injection 80 mg  80 mg Intra-articular Once Hallal, Nadim L, MD           Allergies   Allergen Reactions    Aspirin      Break out in sweat and dizziness     Peppers     Pollen Extract     Latex Itching and Rash       SUBJECTIVE     Chief Complaint   Patient presents with    Back Pain     lower right side, sharp, fall 6 months ago, wants x-ray, pain scale 8/10, takes advil which helps but pain doesn't go away completely  HPI   DM: working on increasing her exercise and improving her diet. Taking glipizide 5mg  BID, tradjenta 5mg  daily, metformin 1000mg  BID. Does not want to change her meds right now. States "I know I can fix it".     Back pain: fall about 6 months ago intially fell on stomach but then twisted around to her back. States back has not been the same since. Worse with different twisting motions. Feels like it has progressively worsened. Is not constant pain, but tiggered with certain movement. Right lower back. Denies numbness/ weakness in legs. Has known chronic knee pain, but denies other leg pain. Heat helps the most with pain when needed.    ROS     Review of Systems   Constitutional: Negative for fatigue, fever and unexpected weight change.   Respiratory: Negative for cough and shortness of breath.    Cardiovascular: Negative for chest pain.   Gastrointestinal: Negative for abdominal pain and bowel incontinence.   Genitourinary: Negative for bladder incontinence.   Musculoskeletal: Positive for back pain. Negative for gait problem and neck pain.   Neurological: Negative for tingling, weakness and numbness.             The following portions of the  patient's history were reviewed and updated as appropriate: Allergies, Current Medications, Past Family History, Past Medical history, Past social history, Past surgical history, and Problem List.    PHYSICAL EXAM     Vitals:    08/02/20 1120   BP: 128/67   BP Site: Left arm   Patient Position: Sitting   Cuff Size: Large   Pulse: 79   Temp: 97.1 F (36.2 C)   TempSrc: Temporal   SpO2: 97%   Weight: 72 kg (158 lb 12.8 oz)   Height: 1.544 m (5' 0.8")       Results for orders placed or performed in visit on 07/14/20   Hemoglobin A1C   Result Value Ref Range    Hemoglobin A1C 7.6 (H) <5.7 % of total Hgb       Physical Exam  Constitutional:       General: She is not in acute distress.     Appearance: Normal appearance. She is well-developed.   HENT:      Head: Normocephalic and atraumatic.   Cardiovascular:      Rate and Rhythm: Normal rate and regular rhythm.      Chest Wall: PMI is not displaced.      Heart sounds: Normal heart sounds.   Pulmonary:      Effort: Pulmonary effort is normal.      Breath sounds: Normal breath sounds and air entry.   Musculoskeletal:      Cervical back: Normal.      Thoracic back: Normal.      Lumbar back: Tenderness present. No swelling, edema, deformity, lacerations or bony tenderness. Normal range of motion.        Back:       Comments: Normal strength and sensation of LE   Skin:     General: Skin is warm and dry.      Capillary Refill: Capillary refill takes less than 2 seconds.   Neurological:      Mental Status: She is alert and oriented to person, place, and time.      Gait: Gait normal.   Psychiatric:         Mood and Affect: Mood normal.         Speech: Speech normal.  Behavior: Behavior normal.             ASSESSMENT/PLAN        1. Chronic right-sided low back pain without sciatica  X-ray lumbar spine AP and lateral    Referral to Physical Therapy-[facility]     Chronic, worsening. Will start with xray and PT. Can continue with use of ice/ heat, gentle stretching,  lidocaine patches, ect PRN for pain management. Avoid heavy lifting/ strenuous activity that may aggravate symptoms. If no improvement will get MRI.     Reviewed med use and side effects. Reviewed s/s that would warrant further and/ or immediate medical attention. Pt in agreement with plan and all questions answered.    Ddx: lumbar strain, sciatica, lumbar DDD, spinal stenosis, herniated disc       Risk & Benefits of the new medication(s) were explained to the patient (and family) who verbalized understanding & agreed to the treatment plan. Patient (family) encouraged to contact me/clinical staff with any questions/concerns      Return if symptoms worsen or fail to improve.    Signed,  Myer Haff, FNP, FNP  08/03/2020

## 2020-08-02 NOTE — Progress Notes (Signed)
Have you seen any specialists/other providers since your last visit with us?    No    Do you agree to telemedicine visit?  No    Arm preference verified?   Yes, no preference    Health Maintenance Due   Topic Date Due   • Advance Directive on File  Never done   • MAMMOGRAM  Never done   • DM OPHTHALMOLOGY EXAM  Never done   • DXA Scan  Never done   • PCMH CARE PLAN LETTER  Never done   • Medicare Annual Wellness Visit  Never done   • Shingrix Vaccine 50+ (1) Never done   • HEPATITIS C SCREENING  Never done   • PAP SMEAR  10/08/2015   • INFLUENZA VACCINE  04/03/2020

## 2020-08-03 ENCOUNTER — Other Ambulatory Visit: Payer: Self-pay | Admitting: Family Medicine

## 2020-08-03 ENCOUNTER — Ambulatory Visit (INDEPENDENT_AMBULATORY_CARE_PROVIDER_SITE_OTHER): Payer: 59

## 2020-08-03 DIAGNOSIS — Z23 Encounter for immunization: Secondary | ICD-10-CM

## 2020-08-03 MED ORDER — VALACYCLOVIR HCL 1 G PO TABS: 2000.0000 mg | ORAL_TABLET | Freq: Two times a day (BID) | ORAL | 0 refills | Status: DC

## 2020-08-03 NOTE — Telephone Encounter (Signed)
Patient is aware Provider sent in rx for cold sores

## 2020-08-03 NOTE — Telephone Encounter (Signed)
I will call out medication for cold sores.

## 2020-08-03 NOTE — Patient Instructions (Signed)
Back Pain (Acute or Chronic)    Back pain is one of the most common problems. The good news is that most people feel better in 1 to 2 weeks, and most of the rest in 1 to 2 months. Most people can remain active.  People who have paindescribe it differently--noteveryone is the same.   The pain can be sharp, stabbing, shooting, aching, cramping or burning.   Movement, standing, bending, lifting, sitting, or walking may worsen pain.   It can be limited to one spot or area, or it can be more generalized.   It can spread upwards, to the front, or go down your arms or legs (sciatica).   It can cause muscle spasm.  Most of the time, mechanical problems with the musclesor spine cause the pain. Mechanical problemsare usually caused by an injury to the muscles or ligaments. Illness can cause back pain, but it's usually not caused by a serious illness. Mechanical problems include:   Physical activity such as sports, exercise, work, or normal activity   Overexertion, lifting, pushing, pulling incorrectly or too aggressively   Sudden twisting, bending, or stretching from an accident, or accidental movement   Poor posture   Stretching or moving wrong, without noticing pain at the time   Poor coordination, lack of regular exercise (check with your doctor about this)   Spinal disc disease or arthritis   Stress  Pain can also be related to pregnancy, or illness like appendicitis, bladder or kidney infections, pelvic infections, and many other things.  Acute back pain usually gets better in1 to 2 weeks. Back pain related to disk disease, arthritis in the spinal joints, or narrowing of the spinal canal (spinal stenosis) can become chronic and last for months or years.  Unless you had a physical injury such as a car accident or fall, X-rays are usually not needed for the first assessment of back pain. If pain continues and does not respond to medical treatment, you may need X-rays and other tests.  Home care  Try  this home care advice:   When in bed, tryto find a position of comfort. A firm mattress is best. Try lying flat on your back with pillows under your knees. You can also try lying on your side with your knees bent up toward your chest and a pillow between your knees.   At first, don't try to stretch out the sore spots. If there is a strain, it's not like the good soreness you get after exercising without an injury. In this case, stretching may make it worse.   Don't sit for long periods, as in a long car ride or during othertravel. This puts more stress on the lower back than standing or walking.   During the first 24 to 72 hours after an acute injury or flare up of chronic back pain, apply an ice pack to the painful area for 20 minutes and then remove it for 20 minutes. Do this over a period of 60 to 90 minutes or several times a day. This will reduce swelling and pain. Wrap the ice pack in a thin towel or plastic to protect your skin.   You can start with ice, then switch to heat. Heat (hot shower, hot bath, or heating pad) reduces pain and works well for muscle spasms. Heat can be applied to the painful area for 20 minutes then remove it for 20 minutes. Do this over a period of 60 to 90 minutes or several times a   day. Don't sleep on a heating pad. It can lead to skin burns or tissue damage.   You can alternate ice and heat therapy. Talk with your doctor aboutthe best treatment for your back pain.   Therapeutic massage can help relax the back muscles without stretching them.   Be aware of safe lifting methods and don't lift anything without stretching first.  Medicines  Talk to your doctor before using medicine, especially if you have other medical problems or are taking other medicines.   You may use over-the-counter medicine as directed on the bottle to control pain, unless another pain medicine was prescribed. If you have chronic conditions like diabetes, liver or kidney disease, stomach ulcers, or  gastrointestinal bleeding, or are taking blood thinners, talk to your doctor before taking any medicine.   Be careful if you are given a prescription medicines, narcotics, or medicine for muscle spasms. They can cause drowsiness, affect your coordination, reflexes, and judgement. Don't drive or operate heavy machinery.  Follow-up care  Follow up with your healthcare provider, or as advised.  If X-rays were taken, you will be told of any new findings that may affect your care  Call 911  Call 911 if any of the following occur:   Trouble breathing   Confusion   Very drowsy or trouble awakening   Fainting or loss of consciousness   Rapid or very slow heart rate   Loss of bowel or bladder control  When to seek medical advice  Call your healthcare provider right away if any of these occur:   Pain becomes worse or spreads to your legs   Weakness or numbness in one or both legs   Numbness in the groin or genital area  [employer] last reviewed this educational content on 06/03/2018   2000-2021 [employer]. All rights reserved. This information is not intended as a substitute for professional medical care. Always follow your healthcare professional's instructions.

## 2020-08-03 NOTE — Progress Notes (Signed)
Patient came into office for COVID pfizer vaccine  Administration. Injection received left  Deltoid. Patient left in good condition, no reaction.

## 2020-08-08 ENCOUNTER — Ambulatory Visit
Admission: RE | Admit: 2020-08-08 | Discharge: 2020-08-08 | Disposition: A | Discharge status: Home / Self Care | Payer: Medicare Other | Source: Ambulatory Visit | Attending: Family Medicine | Admitting: Family Medicine

## 2020-08-08 DIAGNOSIS — M25551 Pain in right hip: Secondary | ICD-10-CM | POA: Diagnosis not present

## 2020-08-08 DIAGNOSIS — M67853 Other specified disorders of tendon, right hip: Secondary | ICD-10-CM | POA: Diagnosis not present

## 2020-08-08 DIAGNOSIS — M5431 Sciatica, right side: Secondary | ICD-10-CM

## 2020-08-08 DIAGNOSIS — M6258 Muscle wasting and atrophy, not elsewhere classified, other site: Secondary | ICD-10-CM | POA: Diagnosis not present

## 2020-08-08 DIAGNOSIS — Z90710 Acquired absence of both cervix and uterus: Secondary | ICD-10-CM | POA: Diagnosis not present

## 2020-08-08 MED ORDER — GADOBENATE DIMEGLUMINE 529 MG/ML IV SOLN
16.0000 mL | Freq: Once | INTRAVENOUS | Status: AC | PRN
  Administered 2020-08-08: 16 mL via INTRAVENOUS

## 2020-08-10 ENCOUNTER — Encounter (INDEPENDENT_AMBULATORY_CARE_PROVIDER_SITE_OTHER): Payer: Self-pay | Admitting: Residents

## 2020-08-11 ENCOUNTER — Telehealth: Payer: Self-pay | Admitting: Family Medicine

## 2020-08-11 ENCOUNTER — Other Ambulatory Visit: Payer: Self-pay | Admitting: Family Medicine

## 2020-08-11 ENCOUNTER — Ambulatory Visit (INDEPENDENT_AMBULATORY_CARE_PROVIDER_SITE_OTHER): Payer: 59 | Admitting: Residents

## 2020-08-11 ENCOUNTER — Encounter (INDEPENDENT_AMBULATORY_CARE_PROVIDER_SITE_OTHER): Payer: Self-pay | Admitting: Residents

## 2020-08-11 VITALS — BP 128/76 | HR 80 | Ht 62.0 in | Wt 155.7 lb

## 2020-08-11 DIAGNOSIS — E119 Type 2 diabetes mellitus without complications: Secondary | ICD-10-CM

## 2020-08-11 DIAGNOSIS — I10 Essential (primary) hypertension: Secondary | ICD-10-CM

## 2020-08-11 DIAGNOSIS — I440 Atrioventricular block, first degree: Secondary | ICD-10-CM

## 2020-08-11 DIAGNOSIS — E7800 Pure hypercholesterolemia, unspecified: Secondary | ICD-10-CM

## 2020-08-11 DIAGNOSIS — N83292 Other ovarian cyst, left side: Secondary | ICD-10-CM

## 2020-08-11 NOTE — Telephone Encounter (Signed)
Cal for results MRI results

## 2020-08-11 NOTE — Progress Notes (Signed)
[facility] OFF[facility] PROGRESS NOTE    [facility] OFFICE -CARDIOLOGY  [address]  [place] [address]  Dept: [phone]  Dept Fax: [phone]       Patient Name: Helen [name]    Date of Visit:  August 11, 2020  Date of Birth: 08/01/1946  AGE: 74 y.o.  Medical Record #: [id number]  Requesting Physician: Dollene Cleveland, MD      CHIEF COMPLAINT: Annual Exam      HISTORY OF PRESENT ILLNESS:      74 year old female with past medical history of hypertension, hyperlipidemia, diabetes, previous bladder and breast cancer and right bundle branch block coming in for follow-up.  Patient has been doing well over the last year, no complaints of chest pain, shortness of breath or palpitations at rest or with exertion.  Patient continues to take her medications as prescribed.  Patient's last echocardiogram was 6 years ago showing normal right and left ventricular function without valvular abnormalities.    PAST MEDICAL HISTORY: She has a past medical history of Anemia, Arthritis, Arthritis, Asthma, Asthma without status asthmaticus, Atrioventricular conduction disorder, Bilateral cataracts, Breast cancer, Breast lump, Cancer, Carcinoma, Chicken pox, Conduction Disorder, Diabetes mellitus type II, Echocardiogram (10/2013), Gastroesophageal reflux disease, Hemorrhoids without complication, Holter monitor (02/2014), Hyperlipidemia, Hypertensive disorder, Malignant neoplasm of breast, Measles, Mumps, Myocardial perfusion scan (11/2013, 12/2013), RBBB, and Syncope. She has a past surgical history that includes Breast biopsy; Hysterectomy; Tumor removal (Left); and ARTHROSCOPY, KNEE (Left, 04/21/2015).    ALLERGIES:   Allergies   Allergen Reactions    Aspirin      Break out in sweat and dizziness     Peppers     Pollen Extract     Latex Itching and Rash       MEDICATIONS:   Current Outpatient Medications   Medication Sig    albuterol (PROVENTIL) (2.5  MG/3ML) 0.083% nebulizer solution Take 2.5 mg by nebulization every 6 (six) hours as needed.    amLODIPine (NORVASC) 10 MG tablet Take 1 tablet (10 mg total) by mouth daily    atorvastatin (LIPITOR) 40 MG tablet TAKE 1 TABLET BY MOUTH EVERY DAY    Blood Glucose Monitoring Suppl (ONE TOUCH ULTRA 2) W/DEVICE KIT One daily    desonide (DESOWEN) 0.05 % cream 1 APPLICATION APPLY ON THE SKIN TWICE A DAY APPLY TO FACE TWICE DAILY.    diclofenac Sodium (VOLTAREN) 1 % Gel topical gel Apply 2 g topically 4 (four) times daily    ferrous sulfate 325 (65 FE) MG tablet ferrous sulfate 325 mg (65 mg iron) tablet    Flovent HFA 110 MCG/ACT inhaler Inhale 1 puff into the lungs 2 (two) times daily    fluticasone (FLONASE) 50 MCG/ACT nasal spray     glipiZIDE (GLUCOTROL) 5 MG tablet Take 1 tablet (5 mg total) by mouth 2 (two) times daily before meals    glucose blood (OneTouch Ultra) test strip 2 times daily    Lancets (onetouch ultrasoft) lancets Use to check blood sugars 2 times daily    linaGLIPtin (Tradjenta) 5 MG Tab Take 1 tablet (5 mg total) by mouth daily    metFORMIN (GLUCOPHAGE) 1000 MG tablet TAKE 1 TABLET BY MOUTH TWICE A DAY    montelukast (SINGULAIR) 10 MG tablet daily       NASONEX 50 MCG/ACT nasal spray as needed.       pantoprazole (PROTONIX) 40 MG tablet Take 40 mg by mouth daily  valsartan-hydroCHLOROthiazide (DIOVAN-HCT) 160-12.5 MG per tablet Take 1 tablet by mouth daily    EPINEPHrine (EPIPEN 2-PAK) 0.3 MG/0.3ML Solution Auto-injector injection Inject 0.3 mLs (0.3 mg total) into the muscle once as needed (Anaphylaxis)        FAMILY HISTORY: family history includes Diabetes in her mother and sister.    SOCIAL HISTORY: She reports that she has never smoked. She has never used smokeless tobacco. She reports that she does not drink alcohol and does not use drugs.    PHYSICAL EXAMINATION    Visit Vitals  BP 128/76 (BP Site: Left arm, Patient Position: Sitting, Cuff Size: Medium)   Pulse 80   Ht  1.575 m (5\' 2" )   Wt 70.6 kg (155 lb 11.2 oz)   BMI 28.48 kg/m       General Appearance:  A well-appearing female in no acute distress.    Skin: Warm and dry to touch, no apparent skin lesions, or masses noted.  Head: Normocephalic, normal hair pattern, no masses or tenderness   Eyes: EOMS Intact, PERRL, conjunctivae and lids unremarkable.  ENT: Ears, Nose and throat reveal no gross abnormalities.  No pallor or cyanosis.  Dentition good.   Neck: JVP normal, no carotid bruit, thyroid not enlarged   Chest: Clear to auscultation bilaterally with good air movement and respiratory effort and no wheezes, rales, or rhonchi   Cardiovascular: Regular rhythm, S1 normal, S2 normal, No S3 or S4. Apical impulse not displaced. No murmur. No gallops or rubs detected   Abdomen: Soft, nontender, nondistended, with normoactive bowel sounds. No organomegaly.  No pulsatile masses, or bruits.   Extremities: Warm without edema. No clubbing, or cyanosis. All peripheral pulses are full and equal.   Neuro: Alert and oriented x3. No gross motor or sensory deficits noted, affect appropriate.        ECG: Sinus rhythm with right bundle branch block      LABS:   Lab Results   Component Value Date    WBC 6.22 02/10/2020    HGB 10.5 (L) 02/10/2020    HCT 34.0 (L) 02/10/2020    PLT 240 02/10/2020     Lab Results   Component Value Date    GLU 127 (H) 02/10/2020    BUN 16.0 02/10/2020    CREAT 0.9 02/10/2020    NA 140 02/10/2020    K 4.2 02/10/2020    CL 106 02/10/2020    CO2 25 02/10/2020    AST 17 02/10/2020    ALT 15 02/10/2020     Lab Results   Component Value Date    TSH 1.26 07/30/2012    HGBA1C 7.6 (H) 07/14/2020     Lab Results   Component Value Date    CHOL 134 02/10/2020    TRIG 50 02/10/2020    HDL 59 02/10/2020    LDL 65 02/10/2020            Most recent echo and nuclear study reviewed.      IMPRESSION:   Helen [name] is a 74 y.o. female with the following problems:    1. Hypertension  2. Hyperlipidemia  3. Type 2 diabetes  4. History of  breast cancer  5. History of bladder cancer  6. 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  Patient with no complaints over the last year, blood pressure and cholesterol remain controlled.  Patient currently has diabetes managed by primary care on oral medications.  Patient had no significant abnormalities on her echo 5 years ago, without symptoms no need to repeat at this time.  We will continue on current medical regimen, will come back if symptoms arise, otherwise see her back in 1 year.                                                     Orders Placed This Encounter   Procedures    ECG 12 lead (Normal)       No orders of the defined types were placed in this encounter.      SIGNED:    Ruben Reason Karriem Muench, DO          This note was generated by the Dragon speech recognition and may contain errors or omissions not intended by the user. Grammatical errors, random word insertions, deletions, pronoun errors, and incomplete sentences are occasional consequences of this technology due to software limitations. Not all errors are caught or corrected. If there are questions or concerns about the content of this note or information contained within the body of this dictation, they should be addressed directly with the author for clarification.

## 2020-08-17 ENCOUNTER — Encounter: Payer: Self-pay | Admitting: Family Medicine

## 2020-08-17 DIAGNOSIS — M5431 Sciatica, right side: Secondary | ICD-10-CM

## 2020-08-22 ENCOUNTER — Ambulatory Visit (HOSPITAL_COMMUNITY)
Admission: RE | Admit: 2020-08-22 | Discharge: 2020-08-22 | Disposition: A | Discharge status: Home / Self Care | Payer: Medicare Other | Source: Ambulatory Visit | Attending: Family Medicine | Admitting: Family Medicine

## 2020-08-22 ENCOUNTER — Other Ambulatory Visit: Payer: Self-pay

## 2020-08-22 ENCOUNTER — Encounter (HOSPITAL_COMMUNITY): Payer: Self-pay

## 2020-08-22 ENCOUNTER — Other Ambulatory Visit: Payer: Self-pay | Admitting: Family Medicine

## 2020-08-22 ENCOUNTER — Ambulatory Visit (HOSPITAL_COMMUNITY): Admission: RE | Admit: 2020-08-22 | Payer: Medicare Other | Source: Ambulatory Visit

## 2020-08-22 DIAGNOSIS — N83292 Other ovarian cyst, left side: Secondary | ICD-10-CM | POA: Insufficient documentation

## 2020-08-22 DIAGNOSIS — N838 Other noninflammatory disorders of ovary, fallopian tube and broad ligament: Secondary | ICD-10-CM | POA: Diagnosis not present

## 2020-08-22 DIAGNOSIS — N83202 Unspecified ovarian cyst, left side: Secondary | ICD-10-CM | POA: Diagnosis not present

## 2020-08-22 DIAGNOSIS — Z90710 Acquired absence of both cervix and uterus: Secondary | ICD-10-CM | POA: Diagnosis not present

## 2020-08-23 ENCOUNTER — Ambulatory Visit (INDEPENDENT_AMBULATORY_CARE_PROVIDER_SITE_OTHER): Payer: Medicare Other | Admitting: Family Medicine

## 2020-08-23 ENCOUNTER — Other Ambulatory Visit: Payer: Self-pay | Admitting: Family Medicine

## 2020-08-23 ENCOUNTER — Encounter: Payer: Self-pay | Admitting: Family Medicine

## 2020-08-23 ENCOUNTER — Telehealth: Payer: Self-pay | Admitting: Family Medicine

## 2020-08-23 VITALS — BP 130/90 | HR 83 | Temp 98.3°F | Ht 67.0 in | Wt 180.0 lb

## 2020-08-23 DIAGNOSIS — N83292 Other ovarian cyst, left side: Secondary | ICD-10-CM | POA: Diagnosis not present

## 2020-08-23 DIAGNOSIS — M25551 Pain in right hip: Secondary | ICD-10-CM

## 2020-08-23 DIAGNOSIS — G8929 Other chronic pain: Secondary | ICD-10-CM

## 2020-08-23 DIAGNOSIS — M545 Low back pain, unspecified: Secondary | ICD-10-CM | POA: Diagnosis not present

## 2020-08-23 MED ORDER — TRAMADOL HCL 50 MG PO TABS
50.0000 mg | ORAL_TABLET | Freq: Four times a day (QID) | ORAL | 0 refills | Status: DC | PRN
Start: 2020-08-23 — End: 2020-11-15

## 2020-08-23 NOTE — Progress Notes (Signed)
I saw and examined the patient with Dr. Elouise Munroe and agree with assessment and plan as outlined.    Right greater trochanter injected today.  If no lasting relief, could consider lumbar MRI +/- ESI.

## 2020-08-23 NOTE — Progress Notes (Signed)
Office Visit Note   Patient: April Beck           Date of Birth: 20-Feb-1946           MRN: [id number] Visit Date: 08/23/2020 Requested by: Susy Frizzle, MD [address] [place],  [place] [address] PCP: Susy Frizzle, MD  Subjective: Chief Complaint  Patient presents with  . Lower Back - Pain    Pain in the right buttock and down the leg, especially at night when lying down. Will start hurting in the right posterior hip when she is just sitting on the couch. Pain started several months ago - no known injury.    HPI: 74yo F presenting to clinic with right posteriolateral hip pain for several months. Patient states that pain is primarily at night or when she first starts walking. Pain starts in her right buttocks, and radiates down the lateral aspect of her leg, with a specific point of tenderness over the greater trochanter. She says that when she lays down to go to bed at night the pain becomes severe and stops her from getting comfortable. Occasionally, when she's walking, the pain will 'grab' her, but she's able to push through it. Denies numbness/weakness in her legs. Denies pain deep in the groin. Occasionally, her back will hurt, but the majority of her pain is more focused in the buttocks. Pain will also occasionally radiate all the way down her leg into her foot. No bowel/bladder dysfunction.               ROS:  All other systems were reviewed and are negative. Of note, currently being evaluated for suspicious ovarian mass that was recently discovered.   Objective: Vital Signs: There were no vitals taken for this visit.  Physical Exam:  General:  Alert and oriented, in no acute distress. Pulm:  Breathing unlabored. Psy:  Normal mood, congruent affect. Skin:  Right hip overlying skin without bruising or rashes. No erythema. Overlying skin intact.   Right hip/Back:  Normal gait.  No varus or valgus deformity of the knee, appropriate Q angle of the hip.   No pelvic asymmetry.  Full range of motion of hip with no pain.  Symmetric internal and external rotation. Able to achieve toe touch without discomfort. No pain with extension of lumbar spine.   Strength: 5 out of 5 strength with hip flexion, abduction and adduction as well as knee flexion and extension, and ankle dorsi/plantarflexion.  Palpation:  Endorses significant tenderness to palpation over the greater trochanteric area as well as over the glute medius.  No tenderness over bilateral SI joints.  Spring's test negative. No anterior hip flexor tender points.  No lumbar paraspinal muscle tenderness.   Supine exam: No pain with logroll.  FADIR produces no deep pain. FABER produces no posterior or groin pain.   SLR: Negative bilaterally.  Trendelenburg's with decreased hip stabilizer strength on right.   Imaging: US PELVIC COMPLETE WITH TRANSVAGINAL  Result Date: 08/23/2020 CLINICAL DATA:  Initial evaluation for ovarian cyst. History of prior hysterectomy. EXAM: TRANSABDOMINAL AND TRANSVAGINAL ULTRASOUND OF PELVIS TECHNIQUE: Both transabdominal and transvaginal ultrasound examinations of the pelvis were performed. Transabdominal technique was performed for global imaging of the pelvis including uterus, ovaries, adnexal regions, and pelvic cul-de-sac. It was necessary to proceed with endovaginal exam following the transabdominal exam to visualize the pelvic structures. COMPARISON:  None available. FINDINGS: Uterus Prior hysterectomy.  No abnormality about the vaginal cuff. Endometrium Surgically absent.  Right ovary Measurements: 2.6 x 1.8 x 3.8 cm = volume: 9.1 mL. Normal appearance/no adnexal mass. Left ovary Measurements: 5.1 x 3.3 x 3.4 cm = volume: 29.6 mL. 2.6 x 4.4 x 2.8 cm complex left adnexal cyst, likely reflecting a complex ovarian cystic mass. Lesion demonstrates a single eccentric curvilinear internal septation. Associated internal mural solid nodularity. No associated vascularity  evident by sonography. Other findings No abnormal free fluid. IMPRESSION: 1. 2.6 x 4.4 x 2.8 cm complex left adnexal cyst with internal septation and mural solid nodularity. 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 Gynecologic referral for further workup and surgical consultation recommended. 2. Prior hysterectomy. 3. Normal sonographic appearance of the right ovary.  No free fluid. These results will be called to the ordering clinician or representative by the Radiologist Assistant, and communication documented in the PACS or [employer]. Electronically Signed   By: Jeannine Boga M.D.   On: 08/23/2020 00:57    Assessment & Plan: 74yo F presenting to clinic with posteriolateral hip pain on the right for several months. 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  - Patient [place], and PT would be very hard for her to make with regularity. Additionally, she is pending several medical specialty evaluations. Thus, will start therapy with injection today, as well as HEP.  - Injection performed as described below, which patient tolerated very well.  - Return precautions were discussed - RTC in 1-2 weeks if symptoms have no improved - Patient expressed understanding, and had no further questions or concerns today.      Procedures: Right Greater Trochanter Cortisone Injection:  Risks and benefits of procedure discussed, Patient opted to proceed. Verbal Consent obtained.  Timeout performed.  Skin prepped in a sterile fashion with betadine before further cleansing with alcohol. Ethyl Chloride was used for topical analgesia.  Area of maximal tenderness over the right greater trochanter was injected with 4cc 0.25%  Bupivocaine without epinephrine using a 22G, spinal needle. Syringe was removed from the needle, and 6mg  betamethasone was then injected into the area.   Patient tolerated the injection well with no immediate complications. Aftercare instructions were discussed, and patient was given strict return precautions.      PMFS History: Patient Active Problem List   Diagnosis Date Noted  . Dysphagia, unspecified(787.20) 06/14/2013  . BV (bacterial vaginosis) 06/14/2013  . Vaginal atrophy 06/14/2013  . Heel pain 06/14/2013  . Hypercholesteremia   . Hypertension   . GERD (gastroesophageal reflux disease)   . Hypothyroidism   . Renal insufficiency 06/19/2012  . Hypothyroid 06/19/2012  . Acalculous cholecystitis 06/19/2012   Past Medical History:  Diagnosis Date  . 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 Allergy   . Anxiety    hx of panic attack  . Arthritis    hands & knees  . Galactose intolerance    Assymptomatic, but glucose in urine  . GERD (gastroesophageal reflux disease)   . Heart murmur    "small " per pt   . Hypercholesteremia   . Hypertension    off BP meds ~1 yr ago   . Hypothyroidism   . Osteopenia   . Renal insufficiency 06/19/2012    Family History  Problem Relation Age of Onset  . Colon polyps Brother   .  Colon cancer Neg Hx   . Esophageal cancer Neg Hx   . Rectal cancer Neg Hx   . Stomach cancer Neg Hx     Past Surgical History:  Procedure Laterality Date  . ABDOMINAL HYSTERECTOMY    . CHOLECYSTECTOMY  06/20/2012   Procedure: LAPAROSCOPIC CHOLECYSTECTOMY;  Surgeon: Zenovia Jarred, MD;  Location: [facility];  Service: General;  Laterality: N/A;  . COLONOSCOPY  11/22/2008  . [place] Bladder sling  09/2005   Dr. Jeffie Pollock   Social History   Occupational History  . Not on file  Tobacco Use  . Smoking status: Never Smoker  . Smokeless tobacco: Never Used  Vaping Use  . Vaping Use: Former  Substance and Sexual Activity  . Alcohol use: No  . Drug use: No  .  Sexual activity: Not on file

## 2020-08-23 NOTE — Telephone Encounter (Signed)
[facility] Radiology call for report  [phone]

## 2020-08-23 NOTE — Progress Notes (Signed)
Subjective:    Patient ID: April Beck, female    DOB: Feb 11, 1946, 74 y.o.   MRN: [id number]   05/2020 Patient presents today with several concerns. First, she reports sciatica-like pain in her right leg for the last 2 months. She states that the pain begins in her posterior right hip and radiates down her right leg particularly at night all the way into her right foot. It hurts to lay on that side. She cannot get comfortable at night. She is not resting well. She denies any saddle anesthesia or bowel or bladder incontinence. She denies any leg weakness. She has minimal tenderness to palpation over the greater trochanteric bursa. Second, she reports fatigue. This is been for the last week. She states that she just does not feel well. She denies any cough. She denies any fever. She denies any chest pain or shortness of breath. She denies any nausea or vomiting. She denies any weight loss. Third, the patient reports sores in her mouth. There is a white ulcer-like lesion at the base of her tongue on the left side this approximately 2 mm in diameter. There is a similar one on the right gum near the second molar on the floor of her mouth. These appear to be canker sores. She states that they come and go. Fourth she reports hearing loss in her left ear. Exam reveals a cerumen impaction. Fifth she would like a flu shot. Six she has several moles that she would like evaluated. On exam she has a large 1.5 cm seborrheic keratosis in her right mid axillary line just above her bra strap. I reassured her that this is benign.  At that time, my plan was: We will treat the aphthous ulcers in the mouth with triamcinolone paste 0.1% applied twice daily until healed. Recheck if no better in 1 week. We will treat the sciatica with a prednisone taper pack. If not better obtain an x-ray of the lumbar spine. We will treat the fatigue by obtaining a CBC, CMP, TSH, B12 level. However I suspect the fatigue may be due to the  fact the patient's not resting well due to the pain in her posterior right hip and down the right side of her leg she states that she is not sleeping well due to the pain. Cerumen impaction was removed with irrigation and lavage. Reassured the patient that the "mole" on her right side was a seborrheic keratosis. This does not require biopsy.  08/23/20 Due to the pain in her right hip which I suspect is sciatica, the patient had an MRI of the right hip.  The MRI of the right hip revealed no abnormalities in the hip joint itself but there was a coincidental finding of a mass in the left adnexa.  This coincidental finding subsequently led to [place] ordering an ultrasound of the pelvis which the patient had yesterday.  The results are included below: IMPRESSION: 1. 2.6 x 4.4 x 2.8 cm complex left adnexal cyst with internal septation and mural solid nodularity. 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 Gynecologic referral for further workup and surgical consultation recommended. 2. Prior hysterectomy. 3. Normal sonographic appearance of the right ovary.  No free fluid. Patient has already heard from the gynecologist and they have scheduled her an appointment to be seen by her gynecologist on January 4.  She is here today to discuss this.  She also reports that there is still a burning sensation in her mouth.  It  hurts every time she brushes her teeth.  The burning sensation is limited to the left buccal mucosa.  On examination there is a 6 mm patch of erythematous mucosal irritation as shown below in the photograph:   The photograph above is upside down.  The patch of erythema is just below the posterior molar.  Is roughly the diameter of a pencil eraser.  There is no palpable abnormality in that area.  Is not firm or hard.  She states that it burns every time she touches it.  It hurts when she eats.  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  She saw no benefit from  triamcinolone.  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   Past Medical History:  Diagnosis Date  . 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 Allergy   . Anxiety    hx of panic attack  . Arthritis    hands & knees  . Galactose intolerance    Assymptomatic, but glucose in urine  . GERD (gastroesophageal reflux disease)   . Heart murmur    "small " per pt   . Hypercholesteremia   . Hypertension    off BP meds ~1 yr ago   . Hypothyroidism   . Osteopenia   . Renal insufficiency 06/19/2012   Past Surgical History:  Procedure Laterality Date  . ABDOMINAL HYSTERECTOMY    . CHOLECYSTECTOMY  06/20/2012   Procedure: LAPAROSCOPIC CHOLECYSTECTOMY;  Surgeon: Zenovia Jarred, MD;  Location: [facility];  Service: General;  Laterality: N/A;  . COLONOSCOPY  11/22/2008  . [place] Bladder sling  09/2005   Dr. Jeffie Pollock   Current Outpatient Medications on File Prior to Visit  Medication Sig Dispense Refill  . acetaminophen (TYLENOL) 325 MG tablet Take 2 tablets (650 mg total) by mouth every 6 (six) hours as needed for pain.    [name] acyclovir (ZOVIRAX) 400 MG tablet Take 1 tablet (400 mg total) by mouth daily. 90 tablet 3  . atorvastatin (LIPITOR) 40 MG tablet Take 1 tablet (40 mg total) by mouth daily. 90 tablet 3  . citalopram (CELEXA) 10 MG tablet Take 1 tablet (10 mg total) by mouth daily. 90 tablet 3  . diazepam (VALIUM) 5 MG tablet TAKE 1 TABLET BY MOUTH AT BEDTIME AS NEEDED (TO RELAX MUSCLES). 30 tablet 1  . diphenoxylate-atropine (LOMOTIL) 2.5-0.025 MG tablet TAKE 2 TABLETS BY MOUTH 4 TIMES A DAY AS NEEDED FOR DIARRHEA OR LOOSE STOOLS 30 tablet 0  . fluticasone (FLONASE) 50 MCG/ACT nasal spray Place 2 sprays into both nostrils daily. 16 g 6  . hydrochlorothiazide (HYDRODIURIL) 25 MG tablet Take 1 tablet (25 mg total) by mouth daily. 90 tablet 3  . Hydrocortisone Butyrate 0.1 % SOLN Apply 1 application topically 2 (two) times  daily as needed. 60 mL 0  . KLOR-CON M20 20 MEQ tablet TAKE 1 TABLET DAILY 90 tablet 3  . levothyroxine (SYNTHROID) 50 MCG tablet Take 1 tablet (50 mcg total) by mouth daily. 90 tablet 3  . meloxicam (MOBIC) 15 MG tablet TAKE 1 TABLET DAILY 90 tablet 3  . pantoprazole (PROTONIX) 40 MG tablet Take 1 tablet (40 mg total) by mouth 2 (two) times daily. 180 tablet 3  . triamcinolone (KENALOG) 0.1 % paste Use as directed 1 application in the mouth or throat 2 (two) times daily. 5 g 12  . valACYclovir (VALTREX) 1000 MG tablet Take 2 tablets (  2,000 mg total) by mouth 2 (two) times daily. 4 tablet 0  . diphenoxylate-atropine (LOMOTIL) 2.5-0.025 MG tablet Take 2 tablets by mouth 4 (four) times daily as needed for diarrhea or loose stools. 30 tablet 0   No current facility-administered medications on file prior to visit.   No Known Allergies Social History   Socioeconomic History  . Marital status: Divorced    Spouse name: Not on file  . Number of children: Not on file  . Years of education: Not on file  . Highest education level: Not on file  Occupational History  . Not on file  Tobacco Use  . Smoking status: Never Smoker  . Smokeless tobacco: Never Used  Vaping Use  . Vaping Use: Former  Substance and Sexual Activity  . Alcohol use: No  . Drug use: No  . Sexual activity: Not on file  Other Topics Concern  . Not on file  Social History Narrative  . Not on file   Social Determinants of Health   Financial Resource Strain: Not on file  Food Insecurity: Not on file  Transportation Needs: Not on file  Physical Activity: Not on file  Stress: Not on file  Social Connections: Not on file  Intimate Partner Violence: Not on file       Review of Systems  All other systems reviewed and are negative.      Objective:   Physical Exam Vitals reviewed.  Constitutional:      General: She is not in acute distress.    Appearance: She is well-developed. She is not diaphoretic.  HENT:      Head: Normocephalic and atraumatic.     Right Ear: External ear normal.     Left Ear: External ear normal.     Nose: Nose normal.     Mouth/Throat:     Pharynx: No oropharyngeal exudate.   Eyes:     General: No scleral icterus.       Right eye: No discharge.        Left eye: No discharge.     Conjunctiva/sclera: Conjunctivae normal.     Pupils: Pupils are equal, round, and reactive to light.  Neck:     Thyroid: No thyromegaly.     Vascular: No JVD.     Trachea: No tracheal deviation.  Cardiovascular:     Rate and Rhythm: Normal rate and regular rhythm.     Heart sounds: Normal heart sounds. No murmur heard. No friction rub. No gallop.   Pulmonary:     Effort: Pulmonary effort is normal. No respiratory distress.     Breath sounds: Normal breath sounds. No stridor. No wheezing or rales.  Chest:     Chest wall: No tenderness.  Abdominal:     General: Bowel sounds are normal. There is no distension.     Palpations: Abdomen is soft. There is no mass.     Tenderness: There is no abdominal tenderness. There is no guarding or rebound.  Musculoskeletal:        General: No tenderness or deformity.     Cervical back: Normal range of motion and neck supple.       Legs:  Lymphadenopathy:     Cervical: No cervical adenopathy.  Skin:    General: Skin is warm.     Coloration: Skin is not pale.     Findings: No erythema or rash.  Neurological:     Mental Status: She is alert and oriented to person, place, and time.  Cranial Nerves: No cranial nerve deficit.     Motor: No abnormal muscle tone.     Coordination: Coordination normal.     Deep Tendon Reflexes: Reflexes are normal and symmetric.  Psychiatric:        Behavior: Behavior normal.        Thought Content: Thought content normal.        Judgment: Judgment normal.   Patch of erythema is not in the original location of the aphthous ulcers as diagrammed on her previous office visit from September.  Therefore this would be a  new lesion        Assessment & Plan:  Complex cyst of left ovary  There are 4 issues today.  The complex cyst on her left ovary that was a coincidental finding during the work-up for her right-sided sciatica.  The burning mouth and the erythema on the left buccal mucosa.  The right-sided sciatica.  The abnormal skin lesion on her medial left ankle.  I am concerned that this complex cyst on her left ovary could be ovarian cancer.  Therefore I believe that this takes precedence over the other issues.  She has an appointment to meet with a gynecologist January 4.  At that point they can determine if and how they want to biopsy this lesion.  I suspect that they will remove both ovaries however I will defer to their expertise.  Once the treatment strategy is in place for this, and the biopsy has returned, we can determine what we want to do for the other 3 issues.  She has an appointment to see the orthopedist today to discuss her back however I believe that this would most likely benefit from epidural steroid injections.  I believe that she would benefit from an excision of the mass on her left ankle by dermatologist but I would defer that until after we have determined the best way to treat the ovarian mass.  The patient will notify me when she is ready to schedule her to see the dermatologist.  The erythematous patch on her left buccal mucosa is indistinct.  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  Again I will defer sending her to an oral surgeon until after we have determine how best to treat her ovarian mass.

## 2020-08-29 NOTE — Telephone Encounter (Signed)
Call placed to [facility] for call report to be documented

## 2020-08-29 NOTE — Telephone Encounter (Signed)
Call placed to [facility] for call report

## 2020-09-01 ENCOUNTER — Ambulatory Visit
Admission: RE | Admit: 2020-09-01 | Discharge: 2020-09-01 | Disposition: A | Discharge status: Home / Self Care | Payer: 59 | Source: Ambulatory Visit | Attending: Family | Admitting: Family

## 2020-09-01 DIAGNOSIS — G8929 Other chronic pain: Secondary | ICD-10-CM | POA: Insufficient documentation

## 2020-09-01 DIAGNOSIS — M545 Low back pain, unspecified: Secondary | ICD-10-CM | POA: Insufficient documentation

## 2020-09-05 ENCOUNTER — Emergency Department: Payer: 59

## 2020-09-05 ENCOUNTER — Other Ambulatory Visit (INDEPENDENT_AMBULATORY_CARE_PROVIDER_SITE_OTHER): Payer: Self-pay | Admitting: Family

## 2020-09-05 ENCOUNTER — Emergency Department
Admission: EM | Admit: 2020-09-05 | Discharge: 2020-09-05 | Disposition: A | Discharge status: Home / Self Care | Payer: 59 | Attending: Emergency Medicine | Admitting: Emergency Medicine

## 2020-09-05 DIAGNOSIS — G8929 Other chronic pain: Secondary | ICD-10-CM

## 2020-09-05 DIAGNOSIS — M47812 Spondylosis without myelopathy or radiculopathy, cervical region: Secondary | ICD-10-CM

## 2020-09-05 DIAGNOSIS — S0990XA Unspecified injury of head, initial encounter: Secondary | ICD-10-CM | POA: Insufficient documentation

## 2020-09-05 DIAGNOSIS — W000XXA Fall on same level due to ice and snow, initial encounter: Secondary | ICD-10-CM | POA: Insufficient documentation

## 2020-09-05 MED ORDER — LIDOCAINE 5 % EX PTCH
1.0000 | MEDICATED_PATCH | CUTANEOUS | 0 refills | Status: DC
Start: 2020-09-05 — End: 2020-09-13

## 2020-09-06 ENCOUNTER — Ambulatory Visit (INDEPENDENT_AMBULATORY_CARE_PROVIDER_SITE_OTHER): Payer: Medicare Other | Admitting: Obstetrics and Gynecology

## 2020-09-06 ENCOUNTER — Encounter: Payer: Self-pay | Admitting: Obstetrics and Gynecology

## 2020-09-06 ENCOUNTER — Other Ambulatory Visit: Payer: Self-pay

## 2020-09-06 VITALS — BP 130/84 | Temp 94.0°F | Ht 66.0 in | Wt 174.0 lb

## 2020-09-06 DIAGNOSIS — N83202 Unspecified ovarian cyst, left side: Secondary | ICD-10-CM | POA: Diagnosis not present

## 2020-09-06 LAB — CEA: CEA: <0.5 ng/mL
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
## 2020-09-06 NOTE — ED Provider Notes (Signed)
EMERGENCY DEPARTMENT HISTORY AND PHYSICAL EXAM     None        Date: 09/05/2020  Patient Name: Helen [name]    History of Presenting Illness     Chief Complaint   Patient presents with    Fall       History Provided By: patient    Chief Complaint: Head injury       Additional History: Helen [name] is a 75 y.o. female presenting to the ED with moderate severity, constant, posterior head injury that occurred prior to arrival.  Patient states she was in her normal state of health when she slipped and fell on ice and struck the back of her head on the ground.  There was no loss of consciousness.  Patient denies any headache, lightheadedness, vomiting.  No neck pain or back pain.  No limp.  No blood thinner use.  No exacerbating or alleviating factors to symptoms.      PCP: Dollene Cleveland, MD  SPECIALISTS:    Current Facility-Administered Medications   Medication Dose Route Frequency Provider Last Rate Last Admin    triamcinolone acetonide (KENALOG-40) 40 MG/ML injection 80 mg  80 mg Intra-articular Once Hallal, Nadim L, MD         Current Outpatient Medications   Medication Sig Dispense Refill    albuterol (PROVENTIL) (2.5 MG/3ML) 0.083% nebulizer solution Take 2.5 mg by nebulization every 6 (six) hours as needed.      amLODIPine (NORVASC) 10 MG tablet Take 1 tablet (10 mg total) by mouth daily 90 tablet 3    atorvastatin (LIPITOR) 40 MG tablet TAKE 1 TABLET BY MOUTH EVERY DAY 90 tablet 1    Blood Glucose Monitoring Suppl (ONE TOUCH ULTRA 2) W/DEVICE KIT One daily 1 each 0    desonide (DESOWEN) 0.05 % cream 1 APPLICATION APPLY ON THE SKIN TWICE A DAY APPLY TO FACE TWICE DAILY.      diclofenac Sodium (VOLTAREN) 1 % Gel topical gel Apply 2 g topically 4 (four) times daily 1 Tube 3    EPINEPHrine (EPIPEN 2-PAK) 0.3 MG/0.3ML Solution Auto-injector injection Inject 0.3 mLs (0.3 mg total) into the muscle once as needed (Anaphylaxis) 1 each 0    ferrous sulfate 325 (65 FE) MG tablet ferrous sulfate 325 mg  (65 mg iron) tablet      Flovent HFA 110 MCG/ACT inhaler Inhale 1 puff into the lungs 2 (two) times daily      fluticasone (FLONASE) 50 MCG/ACT nasal spray       glipiZIDE (GLUCOTROL) 5 MG tablet Take 1 tablet (5 mg total) by mouth 2 (two) times daily before meals 180 tablet 1    glucose blood (OneTouch Ultra) test strip 2 times daily 200 each 3    Lancets (onetouch ultrasoft) lancets Use to check blood sugars 2 times daily 200 each 3    lidocaine (Lidoderm) 5 % Place 1 patch onto the skin every 24 hours Remove & Discard patch within 12 hours or as directed by MD 30 patch 0    linaGLIPtin (Tradjenta) 5 MG Tab Take 1 tablet (5 mg total) by mouth daily 90 tablet 1    metFORMIN (GLUCOPHAGE) 1000 MG tablet TAKE 1 TABLET BY MOUTH TWICE A DAY 180 tablet 1    montelukast (SINGULAIR) 10 MG tablet daily         NASONEX 50 MCG/ACT nasal spray as needed.     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    Bilateral cataracts     Breast cancer     Breast lump     Cancer     breast cancer    Carcinoma     Carcinoma of the bladder    Chicken pox     Childhood illness    Conduction Disorder     RBBB    Diabetes mellitus type II     Non-Insulin dependent    Echocardiogram 10/2013    Gastroesophageal reflux disease     Hemorrhoids without complication     Holter monitor 02/2014    Hyperlipidemia     Hypertensive disorder     Malignant neoplasm of breast     Measles     Childhood illness    Mumps     Childhood illness    Myocardial perfusion scan 11/2013, 12/2013     MPI single Isotope excrise; MPI dual Isotope Lexiscan    RBBB     Syncope        Past Surgical History:  Past Surgical History:   Procedure Laterality Date    ARTHROSCOPY, KNEE Left 04/21/2015    Procedure: ARTHROSCOPY, KNEE PARTIAL MEDIAL AND LATERAL MENISECTOMY LEFT KNEE;  Surgeon: Bess Kinds, MD;  Location: [facility];  Service: Orthopedics;  Laterality: Left;  partial medial menisectomy    BREAST BIOPSY      HYSTERECTOMY      total    TUMOR REMOVAL Left     neck  benign        Family History:  Family History   Problem Relation Age of Onset    Diabetes Mother     Diabetes Sister     Malignant hyperthermia Neg Hx     Pseudochol deficiency Neg Hx     Anesthesia problems Neg Hx        Social History:  Social History     Tobacco Use    Smoking status: Never Smoker    Smokeless tobacco: Never Used   Haematologist Use: Never used   Substance Use Topics    Alcohol use: No    Drug use: No       Allergies:  Allergies   Allergen Reactions    Aspirin      Break out in sweat and dizziness     Peppers     Pollen Extract     Latex Itching and Rash       Review of Systems     Review of Systems: All other systems reviewed and negative.         Physical Exam   BP 152/81    Pulse 98    Temp 98.5 F (36.9 C) (Oral)    Resp 18    Ht 5\' 2"  (1.575 m)    Wt  70.3 kg    SpO2 100%    BMI 28.35 kg/m     Constitutional: Vital signs reviewed. Well appearing. No distress.  Looks younger than stated age.  Head: Normocephalic, atraumatic  Eyes: Conjunctiva and sclera are normal.  No injection or discharge.  Ears, Nose, Throat:  Normal external examination of the nose and ears.  Mucous membranes moist.  Neck: Normal range of motion. No midline C-spine tenderness. Trachea midline.  Respiratory/Chest: Clear to auscultation. No respiratory distress.   Cardiovascular: Regular rate and rhythm. No murmurs.  Abdomen:    Soft.  Non-tender.  Back: No midline CTLS spine tenderness to palpation. Stable pelvis. No hip  tenderness.   Upper Extremity:  No edema. No cyanosis. Bilateral radial pulses intact and equal. No focal tenderness.   Lower Extremity:  No edema. No cyanosis. Bilateral calves symmetrical and non-tender. No focal tenderness.  Skin: Warm and dry. No rash.  Neuro: A&Ox3. CNII -XII intact to testing. Strength 5/5 and symmetrical in the bilateral upper and lower extremities. Sensation to sharp touch intact and equal in the bilateral upper and lower extremities. Coordination intact to finger to nose testing. Normal gait.   Psychiatric: Very pleasant.  Normal affect.  Normal insight.      Diagnostic Study Results     Labs -     Results     ** No results found for the last 24 hours. **          Radiologic Studies -   Radiology Results (24 Hour)     Procedure Component Value Units Date/Time    CT Cervical Spine without Contrast [[id number]] Collected: 09/05/20 2146    Order Status: Completed Updated: 09/05/20 2155    Narrative:      CT CERVICAL SPINE WO CONTRAST    CLINICAL INDICATION: Injury. fall. Neck pain.    TECHNIQUE:   Initially axial acquisition was obtained with thin cuts.  Images were processed with bone-algorithm. Subsequently MPR reformatting  was done to better demonstrate the relationship of the vertebral bodies.  CT Dose reduction technique: One or more the following dose reduction  techniques were utilized: Automated exposure control; Adjustment of the  MVA and/or KVP according to patient's size; Use of the iterative  reconstruction technique.    FINDINGS: The vertebral body heights are well maintained. The spinous  processes are intact. The dens is intact. C1-C2 articulation is intact.  Facets are well aligned.    There is presence of disc degenerative changes involving almost every  level, including C3-4, C4-5, C5-6, C6-7, C7-T1.. Moderate to severe disc  degenerative changes and disc narrowing seen. Moderate marginal  osteophyte seen with foraminal encroachment at C5-6. Mild osteophyte at  C4-5. Facet  arthritis seen on the left to a severe degree at C4-5.   No  acute fracture seen.      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 NO ACUTE FRACTURE.       Darnelle Maffucci, MD   09/05/2020 9:53 PM    CT Head WO Contrast [[id number]] Collected: 09/05/20 2140    Order Status: Completed Updated: 09/05/20 2142    Narrative:      CT HEAD WO CONTRAST    CLINICAL INDICATION: Headache. Fall. Injury.    TECHNIQUE:  Noncontrast CT scan of the head was performed. Axial images  were obtained. Sagittal and coronal MPR reformatting performed.  CT Dose reduction technique: One or more the following dose  reduction  techniques were utilized: Automated exposure control; Adjustment of the  MVA and/or KVP according to patient's size; Use of the iterative  reconstruction technique.    FINDINGS:  The ventricles and cisterns are clear. No acute bleed. No  acute cortical ischemic abnormality. No mass effect or midline shift. No  gross abnormality in the posterior fossa; beam hardening artifacts are  seen.  No evidence for an acute intracranial abnormality.        Impression:          NO ACUTE INTRACRANIAL ABNORMALITY.    Darnelle Maffucci, MD   09/05/2020 9:40 PM      .    Medical Decision Making   I am the first provider for this patient.    I reviewed the vital signs, available nursing notes, past medical history, past surgical history, family history and social history.    Vital Signs-Reviewed the patient's vital signs.   No data found.           ED Course:     1007 -repeat neurological exam remains nonfocal and patient ambulating without difficulty.  Continues to be asymptomatic. Discussed results with pt and counseled on diagnosis, incidental finding of cervical arthritis, f/u plans, medication use, supportive care, and signs and symptoms when to return to ED immediately. Pt voices understanding and agreement with plan. All questions and concerns addressed.         Provider Notes: Patient generally  well-appearing with nonfocal neurological exam and no signs or symptoms of concussion.  Reviewed supportive care and immediate return precautions.  Patient voiced understanding and agreement with plan.          Diagnosis     Clinical Impression:   1. Closed head injury, initial encounter    2. Arthritis of neck        Treatment Plan:   ED Disposition     ED Disposition Condition Date/Time Comment    Discharge  Mon Sep 05, 2020 10:07 PM Angelene Giovanni discharge to home/self care.    Condition at disposition: Stable            _______________________________    This note was generated by the Epic EMR system/ Dragon speech recognition and may contain inherent errors or omissions not intended by the user. Grammatical errors, random word insertions, deletions and pronoun errors  are occasional consequences of this technology due to software limitations. Not all errors are caught or corrected. If there are questions or concerns about the content of this note or information contained within the body of this dictation they should be addressed directly with the author for clarification.      Attestations: This note is prepared by Lynnea Ferrier, MD    _______________________________       Maryella Shivers, MD  09/06/20 [phone]

## 2020-09-07 ENCOUNTER — Encounter: Payer: Self-pay | Admitting: Family Medicine

## 2020-09-07 ENCOUNTER — Encounter: Payer: Self-pay | Admitting: Obstetrics and Gynecology

## 2020-09-07 LAB — CA 125: CA 125: 7 U/mL (ref <35)

## 2020-09-08 NOTE — Telephone Encounter (Signed)
-----   Message from Carver Fila, MD sent at 09/07/2020  2:37 PM EST ----- Regarding: RE: complex left ovarian cyst Hi Abuk Selleck, Overall, my suspicion its a cancer is low. Obviously, I can not say there is zero chance and if the patient feels more comfortable, I'm happy to see her. Looks like her only family history is breast cancer in [facility].  I think it is ok for her to proceed with you for surgery. Agree with plan for intra-abdominal survey, pelvic washings, and decompression of the cyst in a bag prior to removal.  Keep me posted on when you schedule this (especially if it happens at [facility] or [facility]). Lolita Cram  ----- Message ----- From: Patton Salles, MD Sent: 09/07/2020   1:30 PM EST To: Joelle Flessner Rosalin Hawking, MD, # Subject: complex left ovarian cyst                      Hello Dr. Pricilla Holm,   Ms Pinkstaff is a 75 year old G2P2 Caucasian female, status post total abdominal hysterectomy and unilateral ovarian cystectomy for a benign tumor, who presents with a complex left ovarian cyst noted on MRI ordered for evaluation of right hip pain.  She had a pelvic ultrasound in follow up which confirmed a complex left ovarian cyst measuring 2.6 x 4.4 x 2.8 cm.  The cyst contains a single thin septation and mural solid nodularity.  There is no free fluid.  Her CA125 is 7 and her CEA is < 0.5.   My plan is to do a laparoscopic bilateral salpingo-oophorectomy and pelvic washings.  Do you agree with this plan, or do you think you need to see her as a patient?  Just so you are aware, my prior office called [facility] merged with [facility].  Our new name is [facility].  We are located at [address].     Thank you for all of your great care for my patients!  Conley Simmonds, MD

## 2020-09-08 NOTE — Telephone Encounter (Signed)
Spoke with patient, advised as seen below per Dr. Edward Jolly.  Patient request to proceed with surgery as outlined below with Dr. Edward Jolly.  Advised patient I will forward to business office for precert.  Patient verbalizes understanding and is agreeable.   Routing to [employer] for [employer].   Cc: Dr. Edward Jolly

## 2020-09-08 NOTE — Telephone Encounter (Signed)
Please contact patient in follow up to her CA125 and CEA which are normal.   I contacted Dr. Pricilla Holm, GYN Oncology, who thinks the possibility of cancer is low.   I am happy to proceed with surgery for her:  Laparoscopic bilateral salpingo-oophorectomy with collection of pelvic washings.   If she prefers to see Dr. Pricilla Holm for her care, I will make that referral.

## 2020-09-13 ENCOUNTER — Encounter (INDEPENDENT_AMBULATORY_CARE_PROVIDER_SITE_OTHER): Payer: Self-pay | Admitting: Family

## 2020-09-13 ENCOUNTER — Ambulatory Visit (INDEPENDENT_AMBULATORY_CARE_PROVIDER_SITE_OTHER): Payer: 59 | Admitting: Family

## 2020-09-13 VITALS — BP 147/76 | HR 83 | Temp 97.8°F | Ht 60.9 in | Wt 153.6 lb

## 2020-09-13 DIAGNOSIS — M549 Dorsalgia, unspecified: Secondary | ICD-10-CM

## 2020-09-13 DIAGNOSIS — M545 Low back pain, unspecified: Secondary | ICD-10-CM

## 2020-09-13 DIAGNOSIS — S0003XD Contusion of scalp, subsequent encounter: Secondary | ICD-10-CM

## 2020-09-13 DIAGNOSIS — G8929 Other chronic pain: Secondary | ICD-10-CM

## 2020-09-13 DIAGNOSIS — Z1231 Encounter for screening mammogram for malignant neoplasm of breast: Secondary | ICD-10-CM

## 2020-09-13 DIAGNOSIS — M542 Cervicalgia: Secondary | ICD-10-CM

## 2020-09-13 DIAGNOSIS — W009XXD Unspecified fall due to ice and snow, subsequent encounter: Secondary | ICD-10-CM

## 2020-09-13 MED ORDER — LIDOCAINE 5 % EX PTCH
1.0000 | MEDICATED_PATCH | Freq: Every day | CUTANEOUS | 0 refills | Status: DC | PRN
Start: 2020-09-13 — End: 2024-04-10

## 2020-09-13 NOTE — Progress Notes (Signed)
Have you seen any specialists/other providers since your last visit with [place]?    No    Do you agree to telemedicine visit?  No    Arm preference verified?   Yes, no preference    Health Maintenance Due   Topic Date Due    Advance Directive on File  Never done    MAMMOGRAM  Never done    DM OPHTHALMOLOGY EXAM  Never done    DXA Scan  Never done    PCMH CARE PLAN LETTER  Never done    Medicare Annual Wellness Visit  Never done    Shingrix Vaccine 50+ (1) Never done    HEPATITIS C SCREENING  Never done    PAP SMEAR  10/08/2015    INFLUENZA VACCINE  04/03/2020

## 2020-09-13 NOTE — Progress Notes (Signed)
[facility]  Helen [name]  PROGRESS NOTE      Patient: Helen [name]   Date: 09/14/2020   MRN: [id number]     Helen [name] is a 75 y.o. female    Chief Complaint   Patient presents with    ER Follow-up     fell- knot on head, lower right quadrant abdominal pain       MEDICATIONS     Current Outpatient Medications   Medication Sig Dispense Refill    albuterol (PROVENTIL) (2.5 MG/3ML) 0.083% nebulizer solution Take 2.5 mg by nebulization every 6 (six) hours as needed.      amLODIPine (NORVASC) 10 MG tablet Take 1 tablet (10 mg total) by mouth daily 90 tablet 3    atorvastatin (LIPITOR) 40 MG tablet TAKE 1 TABLET BY MOUTH EVERY DAY 90 tablet 1    Blood Glucose Monitoring Suppl (ONE TOUCH ULTRA 2) W/DEVICE KIT One daily 1 each 0    desonide (DESOWEN) 0.05 % cream 1 APPLICATION APPLY ON THE SKIN TWICE A DAY APPLY TO FACE TWICE DAILY.      diclofenac Sodium (VOLTAREN) 1 % Gel topical gel Apply 2 g topically 4 (four) times daily 1 Tube 3    EPINEPHrine (EPIPEN 2-PAK) 0.3 MG/0.3ML Solution Auto-injector injection Inject 0.3 mLs (0.3 mg total) into the muscle once as needed (Anaphylaxis) 1 each 0    Flovent HFA 110 MCG/ACT inhaler Inhale 1 puff into the lungs 2 (two) times daily      fluocinonide (LIDEX) 0.05 % gel       fluticasone (FLONASE) 50 MCG/ACT nasal spray       glipiZIDE (GLUCOTROL) 5 MG tablet Take 1 tablet (5 mg total) by mouth 2 (two) times daily before meals 180 tablet 1    glucose blood (OneTouch Ultra) test strip 2 times daily 200 each 3    Lancets (onetouch ultrasoft) lancets Use to check blood sugars 2 times daily 200 each 3    lidocaine (Lidoderm) 5 % Place 1 patch onto the skin daily as needed (pain) Remove & Discard patch within 12 hours 30 patch 0    linaGLIPtin (Tradjenta) 5 MG Tab Take 1 tablet (5 mg total) by mouth daily 90 tablet 1    metFORMIN (GLUCOPHAGE) 1000 MG tablet TAKE 1 TABLET BY MOUTH TWICE A DAY 180 tablet 1    montelukast (SINGULAIR) 10 MG tablet daily          NASONEX 50 MCG/ACT nasal spray as needed.     5    pantoprazole (PROTONIX) 40 MG tablet Take 40 mg by mouth daily         valsartan-hydroCHLOROthiazide (DIOVAN-HCT) 160-12.5 MG per tablet Take 1 tablet by mouth daily 90 tablet 3    ferrous sulfate 325 (65 FE) MG tablet ferrous sulfate 325 mg (65 mg iron) tablet       Current Facility-Administered Medications   Medication Dose Route Frequency Provider Last Rate Last Admin    triamcinolone acetonide (KENALOG-40) 40 MG/ML injection 80 mg  80 mg Intra-articular Once Hallal, Nadim L, MD           Allergies   Allergen Reactions    Aspirin      Break out in sweat and dizziness     Peppers     Pollen Extract     Latex Itching and Rash       SUBJECTIVE     Chief Complaint   Patient presents with    ER  Follow-up     fell- knot on head, lower right quadrant abdominal pain        HPI   Pt was seen in ER on 1/3 after slipping and falling on ice. States she hit the left back of her head, did not lose consciousness. Had cervical CT showing severe degenerative changes, no acute fracture. Head CT with no acute intracranial abnormality.     Currently has knot of back of left head, ringing and clicking in left ear (seeing ENT tomorrow), neck and back pain (acute on chronic). Also has soreness in RLQ- mainly with movement or with sneezing and this is improving    Denies numbness/ tingling/ weakness    Wants lidocaine patches and PT referral     Taking advil PRN which takes the edge off    ROS     Review of Systems   Constitutional: Negative for chills, fatigue and fever.   Eyes: Negative for visual disturbance.   Respiratory: Negative for cough and shortness of breath.    Cardiovascular: Negative for chest pain.   Gastrointestinal: Positive for abdominal pain. Negative for blood in stool, constipation, diarrhea, nausea and vomiting.   Genitourinary: Negative for difficulty urinating.   Musculoskeletal: Positive for back pain and neck pain.   Neurological: Negative for  dizziness and headaches.     The following portions of the patient's history were reviewed and updated as appropriate: Allergies, Current Medications, Past Family History, Past Medical history, Past social history, Past surgical history, and Problem List.    PHYSICAL EXAM     Vitals:    09/13/20 1310   BP: 147/76   BP Site: Left arm   Patient Position: Sitting   Cuff Size: Medium   Pulse: 83   Temp: 97.8 F (36.6 C)   TempSrc: Temporal   SpO2: 97%   Weight: 69.7 kg (153 lb 9.6 oz)   Height: 1.547 m (5' 0.9")       Results for orders placed or performed in visit on 07/14/20   Hemoglobin A1C   Result Value Ref Range    Hemoglobin A1C 7.6 (H) <5.7 % of total Hgb     Physical Exam  Vitals and nursing note reviewed.   Constitutional:       General: She is awake. She is not in acute distress.     Appearance: Normal appearance.   HENT:      Head:     Cardiovascular:      Rate and Rhythm: Normal rate and regular rhythm.      Heart sounds: Normal heart sounds.   Pulmonary:      Effort: Pulmonary effort is normal.      Breath sounds: Normal breath sounds and air entry.   Musculoskeletal:      Cervical back: Tenderness (BL, muscular) present. No deformity or bony tenderness. Pain with movement present. Normal range of motion.      Thoracic back: Normal. No bony tenderness.      Lumbar back: No tenderness or bony tenderness. Decreased range of motion.      Comments: Full ROM upper and lower extremities with normal strength and sensation   Skin:     General: Skin is warm and dry.   Neurological:      Mental Status: She is alert and oriented to person, place, and time.   Psychiatric:         Mood and Affect: Mood normal.         Speech: Speech  normal.         Behavior: Behavior normal.             ASSESSMENT/PLAN        1. Fall due to slipping on ice or snow, subsequent encounter     2. Contusion of scalp, subsequent encounter  Acute, improving. No symptoms of concussion today. Continue to monitor and f/u as needed.      3.  Chronic right-sided low back pain without sciatica  lidocaine (Lidoderm) 5 %   4. Chronic neck and back pain  Referral to Physical Therapy-[facility]    Acute, worsening. Recommend conservative management at this time, pt declines referral to ortho. Will trial pain relief with lidocaine patch and PT. F/u as needed.      5. Encounter for screening mammogram for malignant neoplasm of breast  Mammo Digital Screening Bilateral W Cad     Reviewed med use and side effects. Reviewed s/s that would warrant further and/ or immediate medical attention. Pt in agreement with plan and all questions answered.       Risk & Benefits of the new medication(s) were explained to the patient (and family) who verbalized understanding & agreed to the treatment plan. Patient (family) encouraged to contact me/clinical staff with any questions/concerns      Return if symptoms worsen or fail to improve.    Signed,  Myer Haff, FNP, FNP  09/14/2020

## 2020-09-14 NOTE — Patient Instructions (Signed)
Head Injury (Adult)    You have a head injury. It doesn't appear serious at this time. But symptoms of a more serious problem, such as a mild brain injury (concussion) or bruising or bleeding in the brain, may appear later. For this reason, you or someone caring for you will need to watch for the symptoms listed below. Once youre home, also be sure to follow any care instructions youre given.   Home care  Watchfor the following symptoms  Seek emergency medical care if you have any of these symptoms over the next hours to days:    Headache that gets worse or doesn't go away   Nausea or vomiting   Dizziness   Sensitivity to light or noise   Unusual sleepiness or grogginess   Trouble falling asleep   Personality changes   Vision changes   Memory loss   Confusion   Trouble walking or clumsiness   Loss of consciousness (even for a short time)   Inability to be awakened   Stiff neck   Weakness or numbness in any part of the body   Seizures  General care   If you were prescribed medicines for pain, use them as directed. Note: Dont take other medicines for pain without talking to your provider first.   To help reduce swelling and pain, apply a cold source to the injured area for up to 20 minutes at a time. Do this as oftenas directed. Use a cold pack or bag of ice wrapped in a thin towel. Never apply a cold source directly to the skin.   If you have cuts or scrapes as a result of your head injury, care for them as directed.   For the next 24 hours(or longer, if instructed):  ? Dont drink alcohol or use sedatives or other medicines that make you sleepy.  ? Dont drive or operate machinery.  ? Dont do anything strenuous, such as heavy lifting or straining.  ? Limit tasks that require concentration. This includes reading, using a smartphone or computer, watching TV, and playing video games.  ? Dont return to sports or other activities that could result in another head injury until approved by  your healthcare provider.    Follow-up care  Follow up with your healthcare provider, or as directed.If imaging tests were done, they will be reviewed by a doctor. You will be told the results and any new findings that may affect your care.   When to seek medical advice  Call your healthcare provider right away if any of these occur:   Pain doesnt get better or worsens   New or increased swelling or bruising   Increased redness,warmth,drainage, or bleeding from the injured area   Fluid drainage or bleeding from the nose or ears   Any depression or bony abnormality in the injured area   Persistent confusion or lethargy   Personality changes   Bruising behind the ears or bruising around the eyes  StayWell last reviewed this educational content on 07/05/2019     2000-2021 [employer], [place]. All rights reserved. This information is not intended as a substitute for professional medical care. Always follow your healthcare professional's instructions.          General Neck and Back Pain    Both neck and back pain are usually caused by injury to the muscles or ligaments of the spine. Sometimes the disks that separate each bone of the spine may cause pain by pressing on a  nearby nerve. Back and neck pain may appear after a sudden twisting or bending force (such as in a car accident), or sometimes after a simple awkward movement. In either case, muscle spasm is often present and adds to the pain.   Acute neck and back pain usually gets better in 1 to 2 weeks. Pain related to disk disease, arthritis in the spinal joints, or narrowing of the spinal canal (spinal stenosis) can become chronic and last for months or years.   Back and neck pain are common problems. Most people feel better in 1 or 2 weeks, and most of the rest in 1 to 2 months. Most people can remain active.   People have anddescribe pain differently.   Pain can be sharp, stabbing, shooting, aching, cramping, or burning   Movement, standing,  bending, lifting, sitting, or walking may worsen the pain   Pain can be limited to one spot or area, or it can be more generalized   Pain can spread upward, downward, to the front, or go down your arms or legs   Muscle spasm may occur.  Most of the time mechanical problems with the muscles or spine cause the pain. It's usually caused by an injury, whether known or not, to the muscles or ligaments. Pain without an injury is not common. But it can sometimes be caused by a health problem such as kidney stones or an infection. Pain is usually related to physical activity such as sports, exercise, work, or normal activity. Sometimes it can occur without an identifiable cause. This can happen simply by stretching or moving wrong, without noting pain at the time. Other causes include:    Overexertion, lifting, pushing, pulling incorrectly or too aggressively.   Sudden twisting, bending or stretching from an accident (car or fall), or accidental movement.   Poor posture   Poor conditioning, lack of regular exercise   Spinal disc disease or arthritis   Stress   Pregnancy, or illness like appendicitis, bladder or kidney infection, pelvic infections  Home care   [facility] pain:Use a comfortable pillow that supports the head and keeps the spine in a neutral position. The position of the head should not be tilted forward or backward.   When in bed, try to find a position of comfort. A firm mattress is best. Try lying flat on your back with pillows under your knees. You can also try lying on your side with your knees bent up towards your chest and a pillow between your knees.   At first, don't try to stretch out the sore spots. If there is a strain, it's not like the good soreness you get after exercising without an injury. In this case, stretching may make it worse.   Don't sit for long periods, as inlong car rides orother travel. This puts more stress on the lower back than standing or walking.   During the  first 24 to 72 hours after an injury, apply an ice pack to the painful area for 20 minutes and then remove it for 20 minutes over a period of 60 to 90 minutes or several times a day.   You can alternate ice and heat therapies. Talk with your healthcare provider about the best treatment for your back or neck pain. As a safety precaution, don't use a heating pad at bedtime. Sleeping with a heating pad can lead to skin burns or tissue damage.   Therapeutic massage can help relax the back and neck muscles without stretching them.  Be aware of safe lifting methods and don't lift anything over 15 pounds until all the pain is gone.    Medicines  Talk to your healthcare provider before using medicine, especially if you have other medical problems or are taking other medicines.    You may use over-the-counter medicine to control pain, unless another pain medicine was prescribed. Talk with your doctor first if you have chronic conditions like diabetes, liver or kidney disease, stomach ulcers, gastrointestinal bleeding, or are taking blood thinner medicines.   Be careful if you are given pain medicines, narcotics, or medicine for muscle spasm. They can cause drowsiness, and can affect your coordination, reflexes, and judgment. Don't drive or operate heavy machinery.  Follow-up care  Follow up with your healthcare provider, or as advised. You may need physical therapy or further tests.   If X-rays were taken, you will be told of any new findings that may affect your care.   Call 911  Call 911 if any of the following occur:    Trouble breathing   Confusion   Very drowsy or trouble awakening   Fainting or loss of consciousness   Rapid or very slow heart rate   Loss of bowel or bladder control  When to seek medical advice  Call your healthcare provider right away if any of these occur:   Pain becomes worse or spreads into your arms or legs   Weakness, numbness or pain in one or both arms or legs   Numbness in the  groin area   Trouble walking   Fever of 100.63F (38C) or higher, or as directed by your healthcare provider  StayWell last reviewed this educational content on 06/03/2018     2000-2021 [employer], [place]. All rights reserved. This information is not intended as a substitute for professional medical care. Always follow your healthcare professional's instructions.

## 2020-09-15 ENCOUNTER — Encounter (INDEPENDENT_AMBULATORY_CARE_PROVIDER_SITE_OTHER): Payer: Self-pay

## 2020-09-15 ENCOUNTER — Encounter (INDEPENDENT_AMBULATORY_CARE_PROVIDER_SITE_OTHER): Payer: Self-pay | Admitting: Family Medicine

## 2020-09-17 ENCOUNTER — Other Ambulatory Visit: Payer: Medicare Other

## 2020-09-17 ENCOUNTER — Telehealth: Payer: Medicare Other | Admitting: Nurse Practitioner

## 2020-09-17 ENCOUNTER — Encounter: Payer: Self-pay | Admitting: Family Medicine

## 2020-09-17 ENCOUNTER — Other Ambulatory Visit: Payer: Self-pay

## 2020-09-17 DIAGNOSIS — Z8616 Personal history of COVID-19: Secondary | ICD-10-CM

## 2020-09-17 DIAGNOSIS — Z20822 Contact with and (suspected) exposure to covid-19: Secondary | ICD-10-CM | POA: Diagnosis not present

## 2020-09-17 DIAGNOSIS — U071 COVID-19: Secondary | ICD-10-CM

## 2020-09-17 HISTORY — DX: Personal history of COVID-19: Z86.16

## 2020-09-17 MED ORDER — BENZONATATE 100 MG PO CAPS: 100.0000 mg | ORAL_CAPSULE | Freq: Three times a day (TID) | ORAL | 0 refills | Status: DC | PRN

## 2020-09-17 NOTE — Progress Notes (Signed)
E-Visit for Corona Virus Screening  We are sorry you are not feeling well. We are here to help!  You have tested positive for COVID-19, meaning that you were infected with the novel coronavirus and could give the virus to others.  It is vitally important that you stay home so you do not spread it to others.      Please continue isolation at home, for at least 10 days since the start of your symptoms and until you have had 24 hours with no fever (without taking a fever reducer) and with improving of symptoms.  If you have no symptoms but tested positive (or all symptoms resolve after 5 days and you have no fever) you can leave your house but continue to wear a mask around others for an additional 5 days. If you have a fever,continue to stay home until you have had 24 hours of no fever. Most cases improve 5-10 days from onset but we have seen a small number of patients who have gotten worse after the 10 days.  Please be sure to watch for worsening symptoms and remain taking the proper precautions.   Go to the nearest hospital ED for assessment if fever/cough/breathlessness are severe or illness seems like a threat to life.    The following symptoms may appear 2-14 days after exposure: . Fever . Cough . Shortness of breath or difficulty breathing . Chills . Repeated shaking with chills . Muscle pain . Headache . Sore throat . New loss of taste or smell . Fatigue . Congestion or runny nose . Nausea or vomiting . Diarrhea  You have been enrolled in MyChart Home Monitoring for COVID-19. Daily you will receive a questionnaire within the MyChart website. Our COVID-19 response team will be monitoring your responses daily.  You can use medication such as A prescription cough medication called Tessalon Perles 100 mg. You may take 1-2 capsules every 8 hours as needed for cough  You have tested positive for Covid but because you are not considered high risk you do not qualify for monoclonal  antibody infusion.  Supportive care is all that is needed.   You may also take acetaminophen (Tylenol) as needed for fever.  HOME CARE: . Only take medications as instructed by your medical team. . Drink plenty of fluids and get plenty of rest. . A steam or ultrasonic humidifier can help if you have congestion.   GET HELP RIGHT AWAY IF YOU HAVE EMERGENCY WARNING SIGNS.  Call 911 or proceed to your closest emergency facility if: . You develop worsening high fever. . Trouble breathing . Bluish lips or face . Persistent pain or pressure in the chest . New confusion . Inability to wake or stay awake . You cough up blood. . Your symptoms become more severe . Inability to hold down food or fluids  This list is not all possible symptoms. Contact your medical provider for any symptoms that are severe or concerning to you.    Your e-visit answers were reviewed by a board certified advanced clinical practitioner to complete your personal care plan.  Depending on the condition, your plan could have included both over the counter or prescription medications.  If there is a problem please reply once you have received a response from your provider.  Your safety is important to us.  If you have drug allergies check your prescription carefully.    You can use MyChart to ask questions about today's visit, request a non-urgent call back, or   ask for a work or school excuse for 24 hours related to this e-Visit. If it has been greater than 24 hours you will need to follow up with your provider, or enter a new e-Visit to address those concerns. You will get an e-mail in the next two days asking about your experience.  I hope that your e-visit has been valuable and will speed your recovery. Thank you for using e-visits.    5-10 minutes spent reviewing and documenting in chart.    

## 2020-09-19 ENCOUNTER — Encounter: Payer: Self-pay | Admitting: Obstetrics and Gynecology

## 2020-09-20 ENCOUNTER — Other Ambulatory Visit: Payer: Self-pay | Admitting: Family Medicine

## 2020-09-20 ENCOUNTER — Telehealth (INDEPENDENT_AMBULATORY_CARE_PROVIDER_SITE_OTHER): Payer: Self-pay | Admitting: Family Medicine

## 2020-09-20 DIAGNOSIS — M549 Dorsalgia, unspecified: Secondary | ICD-10-CM

## 2020-09-20 LAB — NOVEL CORONAVIRUS, NAA: SARS-CoV-2, NAA: DETECTED — AB

## 2020-09-20 MED ORDER — HYDROCOD POLST-CPM POLST ER 10-8 MG/5ML PO SUER: 5.0000 mL | Freq: Two times a day (BID) | ORAL | 0 refills | Status: DC | PRN

## 2020-09-20 NOTE — Telephone Encounter (Signed)
Pt called needs a referral for back pain. She has an appt [facility] [address] on Thursday.  Cb# [phone]

## 2020-09-20 NOTE — Telephone Encounter (Signed)
Referral order placed.

## 2020-09-20 NOTE — Addendum Note (Signed)
Addended by: Myer Haff on: 09/20/2020 03:52 PM     Modules accepted: Orders

## 2020-09-20 NOTE — Telephone Encounter (Signed)
Lvmtcb to inform referral has been placed and faxed

## 2020-09-21 ENCOUNTER — Telehealth: Payer: Self-pay | Admitting: Obstetrics and Gynecology

## 2020-09-21 NOTE — Telephone Encounter (Signed)
Please check on precert status for laparoscopic bilateral salpingo-oophorectomy and collection of pelvic washings.   This surgery will need to be scheduled at [facility].

## 2020-09-21 NOTE — Telephone Encounter (Signed)
Routing to [employer].

## 2020-09-26 ENCOUNTER — Encounter (INDEPENDENT_AMBULATORY_CARE_PROVIDER_SITE_OTHER): Payer: Self-pay | Admitting: Family

## 2020-09-26 DIAGNOSIS — Z20822 Contact with and (suspected) exposure to covid-19: Secondary | ICD-10-CM | POA: Diagnosis not present

## 2020-09-27 NOTE — Telephone Encounter (Signed)
Call to patient. Per DPR, OK to leave message on voicemail. °  °Left voicemail requesting a return call to Hayley to review benefits and schedule recommended surgery with Brook A. Silva, MD, FACOG. °

## 2020-09-28 NOTE — Telephone Encounter (Signed)
Routing to [employer].

## 2020-09-29 ENCOUNTER — Telehealth: Payer: Self-pay

## 2020-09-29 ENCOUNTER — Encounter: Payer: Self-pay | Admitting: Obstetrics and Gynecology

## 2020-09-29 NOTE — Telephone Encounter (Signed)
Call to patient. Per DPR, OK to leave message on voicemail. °  °Left voicemail requesting a return call to April Beck to review benefits and schedule recommended surgery with Brook A. Silva, MD, FACOG. °

## 2020-09-29 NOTE — Telephone Encounter (Signed)
Message sent to [facility] via secure chat:  Good afternoon! [name] contacted our office today ([place]) stating that she has been attempting to get in contact with someone within your office to call her back to schedule a appt for surgery. She expressed her frustration with not being able to contact anyone. Can you please reach out to her. Her number is [phone].   Hayley Carder reply: April Beck! I have attempted to reach this patient multiple times and the phone rings twice and then goes to voicemail. I have left messages for her. I will reach out to her via MyChart as well. Thank you.  Patient is aware and states she will contact their office again and will check her MyChart messages.

## 2020-09-30 ENCOUNTER — Encounter: Payer: Self-pay | Admitting: Family Medicine

## 2020-10-02 ENCOUNTER — Encounter: Payer: Self-pay | Admitting: Obstetrics and Gynecology

## 2020-10-03 ENCOUNTER — Other Ambulatory Visit (INDEPENDENT_AMBULATORY_CARE_PROVIDER_SITE_OTHER): Payer: Self-pay | Admitting: Family

## 2020-10-03 ENCOUNTER — Other Ambulatory Visit: Payer: Self-pay | Admitting: Physical Medicine & Rehabilitation

## 2020-10-03 DIAGNOSIS — M533 Sacrococcygeal disorders, not elsewhere classified: Secondary | ICD-10-CM

## 2020-10-03 NOTE — Telephone Encounter (Signed)
Left message to call Sharee Pimple, RN at [place], [phone] to review surgery dates.

## 2020-10-03 NOTE — Telephone Encounter (Signed)
See telephone encounter dated 09/27/20.   Encounter closed.

## 2020-10-03 NOTE — Telephone Encounter (Signed)
Spoke with patient regarding surgery benefits. Patient acknowledges understanding of information presented. Patient is aware that benefits presented are for professional benefits only. Patient is aware that once surgery is scheduled, the hospital will call with separate benefits. Patient is aware of surgery cancellation policy.  Patient is ready to proceed with scheduling and requesting anytime in February.

## 2020-10-05 ENCOUNTER — Other Ambulatory Visit: Payer: Self-pay

## 2020-10-05 MED ORDER — MELOXICAM 15 MG PO TABS: 15.0000 mg | ORAL_TABLET | Freq: Every day | ORAL | 3 refills | Status: DC

## 2020-10-10 ENCOUNTER — Encounter: Payer: Self-pay | Admitting: Obstetrics and Gynecology

## 2020-10-10 ENCOUNTER — Encounter: Payer: Self-pay | Admitting: Family Medicine

## 2020-10-10 ENCOUNTER — Ambulatory Visit (INDEPENDENT_AMBULATORY_CARE_PROVIDER_SITE_OTHER): Payer: 59 | Admitting: Family

## 2020-10-11 ENCOUNTER — Ambulatory Visit (INDEPENDENT_AMBULATORY_CARE_PROVIDER_SITE_OTHER): Payer: 59 | Admitting: Family

## 2020-10-11 ENCOUNTER — Encounter (INDEPENDENT_AMBULATORY_CARE_PROVIDER_SITE_OTHER): Payer: Self-pay | Admitting: Family

## 2020-10-11 ENCOUNTER — Telehealth: Payer: 59

## 2020-10-11 VITALS — BP 114/68 | HR 78 | Temp 97.5°F | Wt 153.8 lb

## 2020-10-11 DIAGNOSIS — E119 Type 2 diabetes mellitus without complications: Secondary | ICD-10-CM

## 2020-10-11 LAB — COMPREHENSIVE METABOLIC PANEL
ALT: 15 U/L (ref 0–55)
AST (SGOT): 21 U/L (ref 5–34)
Albumin/Globulin Ratio: 1.2 (ref 0.9–2.2)
Albumin: 3.9 g/dL (ref 3.5–5.0)
Alkaline Phosphatase: 61 U/L (ref 37–117)
Anion Gap: 9 (ref 5.0–15.0)
BUN: 17 mg/dL (ref 7.0–19.0)
Bilirubin, Total: 1.2 mg/dL (ref 0.2–1.2)
CO2: 29 mEq/L (ref 21–29)
Calcium: 9.8 mg/dL (ref 7.9–10.2)
Chloride: 101 mEq/L (ref 100–111)
Creatinine: 1 mg/dL (ref 0.4–1.5)
Globulin: 3.3 g/dL (ref 2.0–3.7)
Glucose: 171 mg/dL — ABNORMAL HIGH (ref 70–100)
Potassium: 4.2 mEq/L (ref 3.5–5.1)
Protein, Total: 7.2 g/dL (ref 6.0–8.3)
Sodium: 139 mEq/L (ref 136–145)

## 2020-10-11 LAB — HEMOGLOBIN A1C
Average Estimated Glucose: 188.6 mg/dL
Hemoglobin A1C: 8.2 % — ABNORMAL HIGH (ref 4.6–5.9)

## 2020-10-11 LAB — GFR: EGFR: >60

## 2020-10-11 LAB — HEMOLYSIS INDEX: Hemolysis Index: 2 (ref 0–24)

## 2020-10-11 NOTE — Progress Notes (Signed)
Have you seen any specialists/other providers since your last visit with [place]?    No    Do you agree to telemedicine visit?  No    Arm preference verified?   Yes, no preference    Health Maintenance Due   Topic Date Due    Advance Directive on File  Never done    MAMMOGRAM  Never done    DM OPHTHALMOLOGY EXAM  Never done    DXA Scan  Never done    Medicare Annual Wellness Visit  Never done    Shingrix Vaccine 50+ (1) Never done    HEPATITIS C SCREENING  Never done    PAP SMEAR  10/08/2015    INFLUENZA VACCINE  04/03/2020

## 2020-10-11 NOTE — Progress Notes (Signed)
[facility]  Laurell Josephs  PROGRESS NOTE      Patient: Helen [name]   Date: 10/11/2020   MRN: [id number]     Helen [name] is a 75 y.o. female    Chief Complaint   Patient presents with    Diabetes     follow up       MEDICATIONS     Current Outpatient Medications   Medication Sig Dispense Refill    albuterol (PROVENTIL) (2.5 MG/3ML) 0.083% nebulizer solution Take 2.5 mg by nebulization every 6 (six) hours as needed.      amLODIPine (NORVASC) 10 MG tablet Take 1 tablet (10 mg total) by mouth daily 90 tablet 3    atorvastatin (LIPITOR) 40 MG tablet TAKE 1 TABLET BY MOUTH EVERY DAY 90 tablet 1    Blood Glucose Monitoring Suppl (ONE TOUCH ULTRA 2) W/DEVICE KIT One daily 1 each 0    desonide (DESOWEN) 0.05 % cream 1 APPLICATION APPLY ON THE SKIN TWICE A DAY APPLY TO FACE TWICE DAILY.      diclofenac Sodium (VOLTAREN) 1 % Gel topical gel Apply 2 g topically 4 (four) times daily 1 Tube 3    EPINEPHrine (EPIPEN 2-PAK) 0.3 MG/0.3ML Solution Auto-injector injection Inject 0.3 mLs (0.3 mg total) into the muscle once as needed (Anaphylaxis) 1 each 0    ferrous sulfate 325 (65 FE) MG tablet ferrous sulfate 325 mg (65 mg iron) tablet      Flovent HFA 110 MCG/ACT inhaler Inhale 1 puff into the lungs 2 (two) times daily      fluocinonide (LIDEX) 0.05 % gel       fluticasone (FLONASE) 50 MCG/ACT nasal spray       glipiZIDE (GLUCOTROL) 5 MG tablet Take 1 tablet (5 mg total) by mouth 2 (two) times daily before meals 180 tablet 1    glucose blood (OneTouch Ultra) test strip 2 times daily 200 each 3    Lancets (onetouch ultrasoft) lancets Use to check blood sugars 2 times daily 200 each 3    lidocaine (Lidoderm) 5 % Place 1 patch onto the skin daily as needed (pain) Remove & Discard patch within 12 hours 30 patch 0    linaGLIPtin (Tradjenta) 5 MG Tab Take 1 tablet (5 mg total) by mouth daily 90 tablet 1    metFORMIN (GLUCOPHAGE) 1000 MG tablet TAKE 1 TABLET BY MOUTH TWICE A DAY 180 tablet 1    montelukast  (SINGULAIR) 10 MG tablet daily         NASONEX 50 MCG/ACT nasal spray as needed.     5    pantoprazole (PROTONIX) 40 MG tablet Take 40 mg by mouth daily         valsartan-hydroCHLOROthiazide (DIOVAN-HCT) 160-12.5 MG per tablet Take 1 tablet by mouth daily 90 tablet 3     Current Facility-Administered Medications   Medication Dose Route Frequency Provider Last Rate Last Admin    triamcinolone acetonide (KENALOG-40) 40 MG/ML injection 80 mg  80 mg Intra-articular Once Hallal, Nadim L, MD           Allergies   Allergen Reactions    Aspirin      Break out in sweat and dizziness     Peppers     Pollen Extract     Latex Itching and Rash       SUBJECTIVE     Chief Complaint   Patient presents with    Diabetes     follow up  HPI   DMII follow up. checking sugars at home- usually 130-150, fasting. No numbness/ tingling in extremities- she sees podiatry at least yearly for her foot exams. Taking metformin 1000mg  BID, tradjenta 5mg  daily, glipizide 5mg  BID. Last A1C 7.6.    No formal exercise right now, has a back injury. States her diet is doing well- eating smaller portions.     Had eye exam last year    Saw hematologist last week for anemia- unremarkable workup per pt  ROS     Review of Systems   Constitutional: Negative for fatigue, fever and unexpected weight change.   Respiratory: Negative for cough and shortness of breath.    Cardiovascular: Negative for chest pain.   Gastrointestinal: Negative for abdominal pain, constipation, diarrhea, nausea and vomiting.   Endocrine: Negative for polydipsia, polyphagia and polyuria.   Neurological: Negative for dizziness, weakness, numbness and headaches.             The following portions of the patient's history were reviewed and updated as appropriate: Allergies, Current Medications, Past Family History, Past Medical history, Past social history, Past surgical history, and Problem List.    PHYSICAL EXAM     Vitals:    10/11/20 1103   BP: 114/68   Pulse: 78   Temp:  97.5 F (36.4 C)   SpO2: 97%   Weight: 69.8 kg (153 lb 12.8 oz)       Results for orders placed or performed in visit on 07/14/20   Hemoglobin A1C   Result Value Ref Range    Hemoglobin A1C 7.6 (H) <5.7 % of total Hgb       Physical Exam  Vitals and nursing note reviewed.   Constitutional:       General: She is awake. She is not in acute distress.     Appearance: Normal appearance.   Cardiovascular:      Rate and Rhythm: Normal rate and regular rhythm.      Heart sounds: Normal heart sounds.   Pulmonary:      Effort: Pulmonary effort is normal.      Breath sounds: Normal breath sounds and air entry.   Musculoskeletal:      Right lower leg: No edema.      Left lower leg: No edema.   Neurological:      Mental Status: She is alert and oriented to person, place, and time.   Psychiatric:         Mood and Affect: Mood normal.         Speech: Speech normal.         Behavior: Behavior normal.             ASSESSMENT/PLAN        1. Controlled type 2 diabetes mellitus without complication, without long-term current use of insulin  Hemoglobin A1C    Comprehensive metabolic panel     Chronic, uncontrolled. If no improvement in A1C, may need to increase dose of glipizide for better control. She is working hard on [employer]. Exercise limited right now s/p recent fall and back pain. She is due for her annual podiatry and ophthalmology appointments and will get those scheduled.     Reviewed s/s that would warrant further and/ or immediate medical attention. Pt in agreement with plan and all questions answered.       Risk & Benefits of the new medication(s) were explained to the patient (and family) who verbalized understanding & agreed to the treatment plan.  Patient (family) encouraged to contact me/clinical staff with any questions/concerns      Return in about 6 months (around 04/10/2021) for annual exam AND as needed.    Signed,  Myer Haff, FNP, FNP  10/11/2020

## 2020-10-12 ENCOUNTER — Other Ambulatory Visit (INDEPENDENT_AMBULATORY_CARE_PROVIDER_SITE_OTHER): Payer: Self-pay | Admitting: Family

## 2020-10-12 ENCOUNTER — Ambulatory Visit
Admission: RE | Admit: 2020-10-12 | Discharge: 2020-10-12 | Disposition: A | Discharge status: Home / Self Care | Payer: 59 | Source: Ambulatory Visit | Attending: Physical Medicine & Rehabilitation | Admitting: Physical Medicine & Rehabilitation

## 2020-10-12 DIAGNOSIS — E1165 Type 2 diabetes mellitus with hyperglycemia: Secondary | ICD-10-CM

## 2020-10-12 DIAGNOSIS — M533 Sacrococcygeal disorders, not elsewhere classified: Secondary | ICD-10-CM | POA: Insufficient documentation

## 2020-10-12 DIAGNOSIS — M47817 Spondylosis without myelopathy or radiculopathy, lumbosacral region: Secondary | ICD-10-CM | POA: Insufficient documentation

## 2020-10-12 MED ORDER — TRADJENTA 5 MG PO TABS
1.0000 | ORAL_TABLET | Freq: Every day | ORAL | 1 refills | Status: DC
Start: 2020-10-12 — End: 2021-06-01

## 2020-10-12 MED ORDER — GLIPIZIDE 10 MG PO TABS
10.0000 mg | ORAL_TABLET | Freq: Two times a day (BID) | ORAL | 1 refills | Status: DC
Start: 2020-10-12 — End: 2020-12-12

## 2020-10-12 MED ORDER — AMLODIPINE BESYLATE 10 MG PO TABS
10.0000 mg | ORAL_TABLET | Freq: Every day | ORAL | 3 refills | Status: DC
Start: 2020-10-12 — End: 2022-03-09

## 2020-10-12 MED ORDER — PANTOPRAZOLE SODIUM 40 MG PO TBEC
40.0000 mg | DELAYED_RELEASE_TABLET | Freq: Every day | ORAL | 3 refills | Status: DC
Start: 2020-10-12 — End: 2021-12-12

## 2020-10-12 NOTE — Telephone Encounter (Signed)
Sent in requested scripts. Also based on labs, increasing glipizide to 10mg  BID- sent that script in as well. Thanks!

## 2020-10-12 NOTE — Telephone Encounter (Signed)
Per pt she states her insurance will only cover a 90 day supply.

## 2020-10-14 NOTE — Telephone Encounter (Signed)
Left message to call Rollyn Scialdone, RN at [facility], [phone].  

## 2020-10-17 NOTE — Telephone Encounter (Signed)
Left message to call Jazmine Heckman, RN at [facility], [phone].  

## 2020-10-18 ENCOUNTER — Encounter: Payer: Self-pay | Admitting: Family Medicine

## 2020-10-21 ENCOUNTER — Other Ambulatory Visit: Payer: Self-pay | Admitting: Family Medicine

## 2020-10-21 DIAGNOSIS — N83292 Other ovarian cyst, left side: Secondary | ICD-10-CM

## 2020-10-24 ENCOUNTER — Telehealth (INDEPENDENT_AMBULATORY_CARE_PROVIDER_SITE_OTHER): Payer: Self-pay | Admitting: Family

## 2020-10-24 DIAGNOSIS — E7800 Pure hypercholesterolemia, unspecified: Secondary | ICD-10-CM

## 2020-10-24 MED ORDER — ATORVASTATIN CALCIUM 40 MG PO TABS
40.0000 mg | ORAL_TABLET | Freq: Every day | ORAL | 1 refills | Status: DC
Start: 2020-10-24 — End: 2021-08-29

## 2020-10-24 NOTE — Telephone Encounter (Signed)
Please inform patient

## 2020-10-24 NOTE — Telephone Encounter (Signed)
Called pt - lvm, informed

## 2020-10-24 NOTE — Telephone Encounter (Signed)
sent 

## 2020-10-24 NOTE — Addendum Note (Signed)
Addended by: Myer Haff on: 10/24/2020 02:32 PM     Modules accepted: Orders

## 2020-10-24 NOTE — Telephone Encounter (Signed)
Pt called - requesting refill for   atorvastatin (LIPITOR) 40 MG tablet  Pt stated per Ins please fill for 90 days for cost purposes.

## 2020-10-27 NOTE — Telephone Encounter (Signed)
See MyChart encounter dated 10/10/2020.  Cc: Dr. Quincy Simmonds  Encounter closed.

## 2020-11-02 NOTE — Telephone Encounter (Signed)
Call placed to patient to review surgery dates.  Spoke with patient. Patient states she never received return call regarding surgery scheduling, has since returned to previous GYN. Advised patient I have contacted number on file x3 and left messages to return call (see telephone encounter dated 09/21/20) Patient appreciative of call, declines to proceed with scheduling surgery at this time. Advised patient I will update Dr. Quincy Simmonds.   Routing to provider for final review. Patient is agreeable to disposition. Will close encounter.  Cc: Hayley Carder

## 2020-11-03 ENCOUNTER — Telehealth (INDEPENDENT_AMBULATORY_CARE_PROVIDER_SITE_OTHER): Payer: Self-pay | Admitting: Family

## 2020-11-03 DIAGNOSIS — E119 Type 2 diabetes mellitus without complications: Secondary | ICD-10-CM

## 2020-11-03 MED ORDER — METFORMIN HCL 1000 MG PO TABS
1000.0000 mg | ORAL_TABLET | Freq: Two times a day (BID) | ORAL | 1 refills | Status: DC
Start: 2020-11-03 — End: 2021-10-05

## 2020-11-03 NOTE — Addendum Note (Signed)
Addended by: Myer Haff on: 11/03/2020 03:18 PM     Modules accepted: Orders

## 2020-11-03 NOTE — Telephone Encounter (Signed)
Please inform patient

## 2020-11-03 NOTE — Telephone Encounter (Signed)
Called pt - lvm, informed

## 2020-11-03 NOTE — Telephone Encounter (Signed)
Pt called - requesting refill for   metFORMIN (GLUCOPHAGE) 1000 MG tablet

## 2020-11-03 NOTE — Telephone Encounter (Signed)
Sent!

## 2020-11-07 DIAGNOSIS — N83292 Other ovarian cyst, left side: Secondary | ICD-10-CM | POA: Diagnosis not present

## 2020-11-07 DIAGNOSIS — R1032 Left lower quadrant pain: Secondary | ICD-10-CM | POA: Diagnosis not present

## 2020-11-12 ENCOUNTER — Encounter (HOSPITAL_COMMUNITY): Payer: Self-pay

## 2020-11-12 ENCOUNTER — Inpatient Hospital Stay (HOSPITAL_COMMUNITY)
Admission: EM | Admit: 2020-11-12 | Discharge: 2020-11-15 | DRG: 522 | Disposition: A | Discharge status: Skilled Nursing Facility | Payer: Medicare Other | Attending: Internal Medicine | Admitting: Internal Medicine

## 2020-11-12 ENCOUNTER — Other Ambulatory Visit: Payer: Self-pay

## 2020-11-12 ENCOUNTER — Emergency Department (HOSPITAL_COMMUNITY): Payer: Medicare Other

## 2020-11-12 DIAGNOSIS — T17908D Unspecified foreign body in respiratory tract, part unspecified causing other injury, subsequent encounter: Secondary | ICD-10-CM | POA: Diagnosis not present

## 2020-11-12 DIAGNOSIS — R52 Pain, unspecified: Secondary | ICD-10-CM | POA: Diagnosis not present

## 2020-11-12 DIAGNOSIS — A6004 Herpesviral vulvovaginitis: Secondary | ICD-10-CM | POA: Diagnosis not present

## 2020-11-12 DIAGNOSIS — S72009A Fracture of unspecified part of neck of unspecified femur, initial encounter for closed fracture: Secondary | ICD-10-CM | POA: Diagnosis present

## 2020-11-12 DIAGNOSIS — Z90710 Acquired absence of both cervix and uterus: Secondary | ICD-10-CM

## 2020-11-12 DIAGNOSIS — Z96641 Presence of right artificial hip joint: Secondary | ICD-10-CM | POA: Diagnosis not present

## 2020-11-12 DIAGNOSIS — T17908A Unspecified foreign body in respiratory tract, part unspecified causing other injury, initial encounter: Secondary | ICD-10-CM

## 2020-11-12 DIAGNOSIS — E038 Other specified hypothyroidism: Secondary | ICD-10-CM | POA: Diagnosis not present

## 2020-11-12 DIAGNOSIS — E785 Hyperlipidemia, unspecified: Secondary | ICD-10-CM | POA: Diagnosis present

## 2020-11-12 DIAGNOSIS — F411 Generalized anxiety disorder: Secondary | ICD-10-CM | POA: Diagnosis not present

## 2020-11-12 DIAGNOSIS — Z20822 Contact with and (suspected) exposure to covid-19: Secondary | ICD-10-CM | POA: Diagnosis present

## 2020-11-12 DIAGNOSIS — E7800 Pure hypercholesterolemia, unspecified: Secondary | ICD-10-CM | POA: Diagnosis not present

## 2020-11-12 DIAGNOSIS — N183 Chronic kidney disease, stage 3 unspecified: Secondary | ICD-10-CM | POA: Diagnosis not present

## 2020-11-12 DIAGNOSIS — E876 Hypokalemia: Secondary | ICD-10-CM | POA: Diagnosis present

## 2020-11-12 DIAGNOSIS — Z79890 Hormone replacement therapy (postmenopausal): Secondary | ICD-10-CM | POA: Diagnosis not present

## 2020-11-12 DIAGNOSIS — Z791 Long term (current) use of non-steroidal anti-inflammatories (NSAID): Secondary | ICD-10-CM | POA: Diagnosis not present

## 2020-11-12 DIAGNOSIS — I10 Essential (primary) hypertension: Secondary | ICD-10-CM | POA: Diagnosis not present

## 2020-11-12 DIAGNOSIS — Z9181 History of falling: Secondary | ICD-10-CM | POA: Diagnosis not present

## 2020-11-12 DIAGNOSIS — W19XXXA Unspecified fall, initial encounter: Secondary | ICD-10-CM | POA: Diagnosis not present

## 2020-11-12 DIAGNOSIS — W19XXXD Unspecified fall, subsequent encounter: Secondary | ICD-10-CM | POA: Diagnosis not present

## 2020-11-12 DIAGNOSIS — M159 Polyosteoarthritis, unspecified: Secondary | ICD-10-CM | POA: Diagnosis not present

## 2020-11-12 DIAGNOSIS — S72001A Fracture of unspecified part of neck of right femur, initial encounter for closed fracture: Secondary | ICD-10-CM | POA: Diagnosis present

## 2020-11-12 DIAGNOSIS — S72091A Other fracture of head and neck of right femur, initial encounter for closed fracture: Secondary | ICD-10-CM | POA: Diagnosis not present

## 2020-11-12 DIAGNOSIS — R262 Difficulty in walking, not elsewhere classified: Secondary | ICD-10-CM | POA: Diagnosis not present

## 2020-11-12 DIAGNOSIS — Z79899 Other long term (current) drug therapy: Secondary | ICD-10-CM | POA: Diagnosis not present

## 2020-11-12 DIAGNOSIS — K219 Gastro-esophageal reflux disease without esophagitis: Secondary | ICD-10-CM | POA: Diagnosis not present

## 2020-11-12 DIAGNOSIS — E039 Hypothyroidism, unspecified: Secondary | ICD-10-CM | POA: Diagnosis present

## 2020-11-12 DIAGNOSIS — R5381 Other malaise: Secondary | ICD-10-CM | POA: Diagnosis not present

## 2020-11-12 DIAGNOSIS — W010XXA Fall on same level from slipping, tripping and stumbling without subsequent striking against object, initial encounter: Secondary | ICD-10-CM | POA: Diagnosis present

## 2020-11-12 DIAGNOSIS — S72001G Fracture of unspecified part of neck of right femur, subsequent encounter for closed fracture with delayed healing: Secondary | ICD-10-CM | POA: Diagnosis not present

## 2020-11-12 DIAGNOSIS — K449 Diaphragmatic hernia without obstruction or gangrene: Secondary | ICD-10-CM | POA: Diagnosis not present

## 2020-11-12 DIAGNOSIS — Z471 Aftercare following joint replacement surgery: Secondary | ICD-10-CM | POA: Diagnosis not present

## 2020-11-12 DIAGNOSIS — Y92099 Unspecified place in other non-institutional residence as the place of occurrence of the external cause: Secondary | ICD-10-CM | POA: Diagnosis not present

## 2020-11-12 DIAGNOSIS — R2681 Unsteadiness on feet: Secondary | ICD-10-CM | POA: Diagnosis not present

## 2020-11-12 DIAGNOSIS — Z8616 Personal history of COVID-19: Secondary | ICD-10-CM | POA: Diagnosis not present

## 2020-11-12 DIAGNOSIS — S72001D Fracture of unspecified part of neck of right femur, subsequent encounter for closed fracture with routine healing: Secondary | ICD-10-CM | POA: Diagnosis not present

## 2020-11-12 DIAGNOSIS — M8580 Other specified disorders of bone density and structure, unspecified site: Secondary | ICD-10-CM | POA: Diagnosis not present

## 2020-11-12 DIAGNOSIS — N1831 Chronic kidney disease, stage 3a: Secondary | ICD-10-CM | POA: Diagnosis not present

## 2020-11-12 DIAGNOSIS — I129 Hypertensive chronic kidney disease with stage 1 through stage 4 chronic kidney disease, or unspecified chronic kidney disease: Secondary | ICD-10-CM | POA: Diagnosis not present

## 2020-11-12 DIAGNOSIS — M6281 Muscle weakness (generalized): Secondary | ICD-10-CM | POA: Diagnosis not present

## 2020-11-12 DIAGNOSIS — Q791 Other congenital malformations of diaphragm: Secondary | ICD-10-CM | POA: Diagnosis not present

## 2020-11-12 DIAGNOSIS — Z043 Encounter for examination and observation following other accident: Secondary | ICD-10-CM | POA: Diagnosis not present

## 2020-11-12 LAB — CBC WITH DIFFERENTIAL/PLATELET
Abs Immature Granulocytes: 0.04 10*3/uL (ref 0.00–0.07)
Basophils Absolute: 0 10*3/uL (ref 0.0–0.1)
Basophils Relative: 0 %
Eosinophils Absolute: 0.1 10*3/uL (ref 0.0–0.5)
Eosinophils Relative: 1 %
HCT: 40.4 % (ref 36.0–46.0)
Hemoglobin: 13.2 g/dL (ref 12.0–15.0)
Immature Granulocytes: 1 %
Lymphocytes Relative: 19 %
Lymphs Abs: 1.4 10*3/uL (ref 0.7–4.0)
MCH: 31.7 pg (ref 26.0–34.0)
MCHC: 32.7 g/dL (ref 30.0–36.0)
MCV: 97.1 fL (ref 80.0–100.0)
Monocytes Absolute: 0.5 10*3/uL (ref 0.1–1.0)
Monocytes Relative: 7 %
Neutro Abs: 5.2 10*3/uL (ref 1.7–7.7)
Neutrophils Relative %: 72 %
Platelets: 168 10*3/uL (ref 150–400)
RBC: 4.16 MIL/uL (ref 3.87–5.11)
RDW: 12.2 % (ref 11.5–15.5)
WBC: 7.2 10*3/uL (ref 4.0–10.5)
nRBC: 0 % (ref 0.0–0.2)

## 2020-11-12 LAB — BASIC METABOLIC PANEL
Anion gap: 11 (ref 5–15)
BUN: 30 mg/dL — ABNORMAL HIGH (ref 8–23)
CO2: 28 mmol/L (ref 22–32)
Calcium: 9.5 mg/dL (ref 8.9–10.3)
Chloride: 98 mmol/L (ref 98–111)
Creatinine, Ser: 1.21 mg/dL — ABNORMAL HIGH (ref 0.44–1.00)
GFR, Estimated: 47 mL/min — ABNORMAL LOW (ref >60)
Glucose, Bld: 124 mg/dL — ABNORMAL HIGH (ref 70–99)
Potassium: 3.5 mmol/L (ref 3.5–5.1)
Sodium: 137 mmol/L (ref 135–145)

## 2020-11-12 LAB — TYPE AND SCREEN
ABO/RH(D): O NEG
Antibody Screen: NEGATIVE

## 2020-11-12 LAB — ABO/RH: ABO/RH(D): O NEG

## 2020-11-12 LAB — PROTIME-INR
INR: 0.9 (ref 0.8–1.2)
Prothrombin Time: 11.8 seconds (ref 11.4–15.2)

## 2020-11-12 MED ORDER — ONDANSETRON HCL 4 MG/2ML IJ SOLN
4.0000 mg | Freq: Once | INTRAMUSCULAR | Status: AC
  Administered 2020-11-12: 4 mg via INTRAVENOUS
  Filled 2020-11-12: qty 2

## 2020-11-12 MED ORDER — FENTANYL CITRATE (PF) 100 MCG/2ML IJ SOLN
50.0000 ug | INTRAMUSCULAR | Status: DC | PRN
Start: 2020-11-12 — End: 2020-11-13
  Administered 2020-11-12: 50 ug via INTRAVENOUS
  Filled 2020-11-12 (×2): qty 2

## 2020-11-12 MED ORDER — SODIUM CHLORIDE 0.9 % IV SOLN: INTRAVENOUS | Status: DC
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
## 2020-11-12 NOTE — ED Triage Notes (Signed)
Pt brought in by [facility] from home for fall and right hip pain. EMS reports noted deformity to right hip. Pt unable to straighten leg. External rotation noted.
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
## 2020-11-13 ENCOUNTER — Inpatient Hospital Stay (HOSPITAL_COMMUNITY): Payer: Medicare Other | Admitting: Anesthesiology

## 2020-11-13 ENCOUNTER — Inpatient Hospital Stay (HOSPITAL_COMMUNITY): Payer: Medicare Other

## 2020-11-13 ENCOUNTER — Encounter (HOSPITAL_COMMUNITY)
Admission: EM | Disposition: A | Discharge status: Skilled Nursing Facility | Payer: Self-pay | Source: Home / Self Care | Attending: Internal Medicine

## 2020-11-13 DIAGNOSIS — E038 Other specified hypothyroidism: Secondary | ICD-10-CM

## 2020-11-13 DIAGNOSIS — S72001A Fracture of unspecified part of neck of right femur, initial encounter for closed fracture: Secondary | ICD-10-CM

## 2020-11-13 DIAGNOSIS — S72001D Fracture of unspecified part of neck of right femur, subsequent encounter for closed fracture with routine healing: Secondary | ICD-10-CM

## 2020-11-13 DIAGNOSIS — S72009A Fracture of unspecified part of neck of unspecified femur, initial encounter for closed fracture: Secondary | ICD-10-CM | POA: Diagnosis present

## 2020-11-13 DIAGNOSIS — W19XXXA Unspecified fall, initial encounter: Secondary | ICD-10-CM | POA: Diagnosis not present

## 2020-11-13 DIAGNOSIS — I10 Essential (primary) hypertension: Secondary | ICD-10-CM | POA: Diagnosis not present

## 2020-11-13 HISTORY — PX: HIP ARTHROPLASTY: SHX981

## 2020-11-13 LAB — CBC
HCT: 38.2 % (ref 36.0–46.0)
Hemoglobin: 12.5 g/dL (ref 12.0–15.0)
MCH: 32.1 pg (ref 26.0–34.0)
MCHC: 32.7 g/dL (ref 30.0–36.0)
MCV: 98.2 fL (ref 80.0–100.0)
Platelets: 170 10*3/uL (ref 150–400)
RBC: 3.89 MIL/uL (ref 3.87–5.11)
RDW: 12.1 % (ref 11.5–15.5)
WBC: 8 10*3/uL (ref 4.0–10.5)
nRBC: 0 % (ref 0.0–0.2)

## 2020-11-13 LAB — COMPREHENSIVE METABOLIC PANEL
ALT: 21 U/L (ref 0–44)
AST: 24 U/L (ref 15–41)
Albumin: 3.5 g/dL (ref 3.5–5.0)
Alkaline Phosphatase: 46 U/L (ref 38–126)
Anion gap: 10 (ref 5–15)
BUN: 26 mg/dL — ABNORMAL HIGH (ref 8–23)
CO2: 28 mmol/L (ref 22–32)
Calcium: 8.9 mg/dL (ref 8.9–10.3)
Chloride: 101 mmol/L (ref 98–111)
Creatinine, Ser: 1.08 mg/dL — ABNORMAL HIGH (ref 0.44–1.00)
GFR, Estimated: 54 mL/min — ABNORMAL LOW (ref >60)
Glucose, Bld: 131 mg/dL — ABNORMAL HIGH (ref 70–99)
Potassium: 3.2 mmol/L — ABNORMAL LOW (ref 3.5–5.1)
Sodium: 139 mmol/L (ref 135–145)
Total Bilirubin: 0.6 mg/dL (ref 0.3–1.2)
Total Protein: 5.9 g/dL — ABNORMAL LOW (ref 6.5–8.1)

## 2020-11-13 LAB — MAGNESIUM: Magnesium: 1.1 mg/dL — ABNORMAL LOW (ref 1.7–2.4)

## 2020-11-13 LAB — APTT: aPTT: 33 seconds (ref 24–36)

## 2020-11-13 LAB — MRSA PCR SCREENING: MRSA by PCR: NEGATIVE

## 2020-11-13 LAB — PHOSPHORUS: Phosphorus: 4.1 mg/dL (ref 2.5–4.6)

## 2020-11-13 SURGERY — HEMIARTHROPLASTY, HIP, DIRECT ANTERIOR APPROACH, FOR FRACTURE
Anesthesia: Choice | Site: Hip | Laterality: Right

## 2020-11-13 SURGERY — HEMIARTHROPLASTY, HIP, DIRECT ANTERIOR APPROACH, FOR FRACTURE
Anesthesia: Spinal | Site: Hip | Laterality: Right

## 2020-11-13 MED ORDER — DEXAMETHASONE SODIUM PHOSPHATE 10 MG/ML IJ SOLN
INTRAMUSCULAR | Status: DC | PRN
  Administered 2020-11-13: 5 mg via INTRAVENOUS

## 2020-11-13 MED ORDER — FENTANYL CITRATE (PF) 100 MCG/2ML IJ SOLN
INTRAMUSCULAR | Status: DC | PRN
  Administered 2020-11-13: 20 ug via INTRATHECAL

## 2020-11-13 MED ORDER — ASPIRIN EC 325 MG PO TBEC
325.0000 mg | DELAYED_RELEASE_TABLET | Freq: Every day | ORAL | Status: DC
  Administered 2020-11-14 – 2020-11-15 (×2): 325 mg via ORAL
  Filled 2020-11-13 (×2): qty 1

## 2020-11-13 MED ORDER — METHOCARBAMOL 1000 MG/10ML IJ SOLN
500.0000 mg | Freq: Four times a day (QID) | INTRAVENOUS | Status: DC | PRN
  Filled 2020-11-13: qty 5

## 2020-11-13 MED ORDER — PHENYLEPHRINE 40 MCG/ML (10ML) SYRINGE FOR IV PUSH (FOR BLOOD PRESSURE SUPPORT)
PREFILLED_SYRINGE | INTRAVENOUS | Status: AC
  Filled 2020-11-13: qty 10

## 2020-11-13 MED ORDER — EPHEDRINE SULFATE-NACL 50-0.9 MG/10ML-% IV SOSY
PREFILLED_SYRINGE | INTRAVENOUS | Status: DC | PRN
  Administered 2020-11-13 (×2): 10 mg via INTRAVENOUS

## 2020-11-13 MED ORDER — BUPIVACAINE-EPINEPHRINE (PF) 0.5% -1:200000 IJ SOLN
INTRAMUSCULAR | Status: AC
  Filled 2020-11-13: qty 60

## 2020-11-13 MED ORDER — POTASSIUM CHLORIDE CRYS ER 20 MEQ PO TBCR
20.0000 meq | EXTENDED_RELEASE_TABLET | Freq: Every day | ORAL | Status: DC
  Administered 2020-11-14 – 2020-11-15 (×2): 20 meq via ORAL
  Filled 2020-11-13 (×2): qty 1

## 2020-11-13 MED ORDER — FENTANYL CITRATE (PF) 100 MCG/2ML IJ SOLN
INTRAMUSCULAR | Status: DC | PRN
  Administered 2020-11-13 (×2): 25 ug via INTRAVENOUS

## 2020-11-13 MED ORDER — TRAMADOL HCL 50 MG PO TABS
50.0000 mg | ORAL_TABLET | Freq: Four times a day (QID) | ORAL | Status: DC | PRN
  Administered 2020-11-14 – 2020-11-15 (×4): 50 mg via ORAL
  Filled 2020-11-13 (×4): qty 1

## 2020-11-13 MED ORDER — DEXMEDETOMIDINE (PRECEDEX) IN NS 20 MCG/5ML (4 MCG/ML) IV SYRINGE
PREFILLED_SYRINGE | INTRAVENOUS | Status: AC
  Filled 2020-11-13: qty 5

## 2020-11-13 MED ORDER — BUPIVACAINE HCL (PF) 0.5 % IJ SOLN
INTRAMUSCULAR | Status: AC
  Filled 2020-11-13: qty 30

## 2020-11-13 MED ORDER — LIDOCAINE HCL (PF) 2 % IJ SOLN
INTRAMUSCULAR | Status: AC
  Filled 2020-11-13: qty 5

## 2020-11-13 MED ORDER — CEFAZOLIN SODIUM-DEXTROSE 2-4 GM/100ML-% IV SOLN
2.0000 g | Freq: Four times a day (QID) | INTRAVENOUS | Status: AC
  Administered 2020-11-13 – 2020-11-14 (×2): 2 g via INTRAVENOUS
  Filled 2020-11-13 (×2): qty 100

## 2020-11-13 MED ORDER — ACETAMINOPHEN 500 MG PO TABS
500.0000 mg | ORAL_TABLET | Freq: Four times a day (QID) | ORAL | Status: AC
  Administered 2020-11-14 (×2): 500 mg via ORAL
  Filled 2020-11-13 (×2): qty 1

## 2020-11-13 MED ORDER — PHENYLEPHRINE 40 MCG/ML (10ML) SYRINGE FOR IV PUSH (FOR BLOOD PRESSURE SUPPORT)
PREFILLED_SYRINGE | INTRAVENOUS | Status: DC | PRN
  Administered 2020-11-13 (×2): 120 ug via INTRAVENOUS
  Administered 2020-11-13 (×3): 200 ug via INTRAVENOUS
  Administered 2020-11-13 (×2): 120 ug via INTRAVENOUS

## 2020-11-13 MED ORDER — CEFAZOLIN SODIUM-DEXTROSE 2-4 GM/100ML-% IV SOLN
INTRAVENOUS | Status: AC
  Administered 2020-11-13: 2000 mg
  Filled 2020-11-13: qty 100

## 2020-11-13 MED ORDER — MAGNESIUM SULFATE 2 GM/50ML IV SOLN
2.0000 g | Freq: Once | INTRAVENOUS | Status: AC
  Administered 2020-11-13: 2 g via INTRAVENOUS
  Filled 2020-11-13: qty 50

## 2020-11-13 MED ORDER — ACETAMINOPHEN 10 MG/ML IV SOLN
1000.0000 mg | Freq: Four times a day (QID) | INTRAVENOUS | Status: AC
  Administered 2020-11-13 – 2020-11-14 (×2): 1000 mg via INTRAVENOUS
  Filled 2020-11-13 (×6): qty 100

## 2020-11-13 MED ORDER — TRAMADOL HCL 50 MG PO TABS
50.0000 mg | ORAL_TABLET | Freq: Once | ORAL | Status: AC
Start: 2020-11-13 — End: 2020-11-13
  Administered 2020-11-13: 50 mg via ORAL
  Filled 2020-11-13: qty 1

## 2020-11-13 MED ORDER — CITALOPRAM HYDROBROMIDE 20 MG PO TABS
10.0000 mg | ORAL_TABLET | Freq: Every day | ORAL | Status: DC
  Administered 2020-11-14 – 2020-11-15 (×2): 10 mg via ORAL
  Filled 2020-11-13 (×2): qty 1

## 2020-11-13 MED ORDER — SODIUM CHLORIDE 0.9 % IR SOLN
Status: DC | PRN
  Administered 2020-11-13: 3000 mL

## 2020-11-13 MED ORDER — POTASSIUM CHLORIDE IN NACL 20-0.9 MEQ/L-% IV SOLN: INTRAVENOUS | Status: AC

## 2020-11-13 MED ORDER — LEVOTHYROXINE SODIUM 50 MCG PO TABS
50.0000 ug | ORAL_TABLET | Freq: Every day | ORAL | Status: DC
  Administered 2020-11-13 – 2020-11-15 (×3): 50 ug via ORAL
  Filled 2020-11-13 (×3): qty 1

## 2020-11-13 MED ORDER — PROPOFOL 10 MG/ML IV BOLUS
INTRAVENOUS | Status: AC
  Filled 2020-11-13: qty 20

## 2020-11-13 MED ORDER — HYDROCOD POLST-CPM POLST ER 10-8 MG/5ML PO SUER: 5.0000 mL | Freq: Two times a day (BID) | ORAL | Status: DC | PRN

## 2020-11-13 MED ORDER — 0.9 % SODIUM CHLORIDE (POUR BTL) OPTIME
TOPICAL | Status: DC | PRN
  Administered 2020-11-13: 1000 mL

## 2020-11-13 MED ORDER — CHLORHEXIDINE GLUCONATE CLOTH 2 % EX PADS
6.0000 | MEDICATED_PAD | Freq: Every day | CUTANEOUS | Status: DC
  Administered 2020-11-13 – 2020-11-15 (×2): 6 via TOPICAL

## 2020-11-13 MED ORDER — LACTATED RINGERS IV SOLN: INTRAVENOUS | Status: DC | PRN

## 2020-11-13 MED ORDER — BENZONATATE 100 MG PO CAPS: 100.0000 mg | ORAL_CAPSULE | Freq: Three times a day (TID) | ORAL | Status: DC | PRN

## 2020-11-13 MED ORDER — ATORVASTATIN CALCIUM 40 MG PO TABS
40.0000 mg | ORAL_TABLET | Freq: Every day | ORAL | Status: DC
  Administered 2020-11-13 – 2020-11-15 (×3): 40 mg via ORAL
  Filled 2020-11-13 (×3): qty 1

## 2020-11-13 MED ORDER — MORPHINE SULFATE (PF) 2 MG/ML IV SOLN
0.5000 mg | INTRAVENOUS | Status: DC | PRN
Start: 2020-11-13 — End: 2020-11-15

## 2020-11-13 MED ORDER — BUPIVACAINE HCL (PF) 0.5 % IJ SOLN
INTRAMUSCULAR | Status: DC | PRN
  Administered 2020-11-13: 1.8 mL via INTRATHECAL

## 2020-11-13 MED ORDER — PROPOFOL 10 MG/ML IV BOLUS
INTRAVENOUS | Status: AC
  Filled 2020-11-13: qty 40

## 2020-11-13 MED ORDER — DOCUSATE SODIUM 100 MG PO CAPS
100.0000 mg | ORAL_CAPSULE | Freq: Two times a day (BID) | ORAL | Status: DC
  Administered 2020-11-14 – 2020-11-15 (×3): 100 mg via ORAL
  Filled 2020-11-13 (×4): qty 1

## 2020-11-13 MED ORDER — DEXMEDETOMIDINE (PRECEDEX) IN NS 20 MCG/5ML (4 MCG/ML) IV SYRINGE
PREFILLED_SYRINGE | INTRAVENOUS | Status: DC | PRN
  Administered 2020-11-13: 8 ug via INTRAVENOUS
  Administered 2020-11-13: 4 ug via INTRAVENOUS

## 2020-11-13 MED ORDER — METOCLOPRAMIDE HCL 10 MG PO TABS: 5.0000 mg | ORAL_TABLET | Freq: Three times a day (TID) | ORAL | Status: DC | PRN

## 2020-11-13 MED ORDER — HYDROMORPHONE HCL 1 MG/ML IJ SOLN
1.0000 mg | INTRAMUSCULAR | Status: DC | PRN
  Administered 2020-11-13: 1 mg via INTRAVENOUS
  Filled 2020-11-13: qty 1

## 2020-11-13 MED ORDER — POTASSIUM CHLORIDE 10 MEQ/100ML IV SOLN
10.0000 meq | INTRAVENOUS | Status: AC
  Administered 2020-11-13 (×2): 10 meq via INTRAVENOUS
  Filled 2020-11-13 (×2): qty 100

## 2020-11-13 MED ORDER — DIPHENOXYLATE-ATROPINE 2.5-0.025 MG PO TABS: 2.0000 | ORAL_TABLET | Freq: Four times a day (QID) | ORAL | Status: DC | PRN

## 2020-11-13 MED ORDER — MENTHOL 3 MG MT LOZG
1.0000 | LOZENGE | OROMUCOSAL | Status: DC | PRN
Start: 2020-11-13 — End: 2020-11-15

## 2020-11-13 MED ORDER — METHOCARBAMOL 500 MG PO TABS: 500.0000 mg | ORAL_TABLET | Freq: Four times a day (QID) | ORAL | Status: DC | PRN

## 2020-11-13 MED ORDER — CEFAZOLIN SODIUM-DEXTROSE 2-3 GM-%(50ML) IV SOLR
INTRAVENOUS | Status: DC | PRN
  Administered 2020-11-13: 2 g via INTRAVENOUS

## 2020-11-13 MED ORDER — METOCLOPRAMIDE HCL 5 MG/ML IJ SOLN
INTRAMUSCULAR | Status: DC | PRN
  Administered 2020-11-13: 10 mg via INTRAVENOUS

## 2020-11-13 MED ORDER — FENTANYL CITRATE (PF) 100 MCG/2ML IJ SOLN
INTRAMUSCULAR | Status: AC
  Filled 2020-11-13: qty 4

## 2020-11-13 MED ORDER — PROPOFOL 10 MG/ML IV BOLUS
INTRAVENOUS | Status: DC | PRN
  Administered 2020-11-13 (×2): 30 mg via INTRAVENOUS
  Administered 2020-11-13: 20 mg via INTRAVENOUS

## 2020-11-13 MED ORDER — HYDROCHLOROTHIAZIDE 25 MG PO TABS
25.0000 mg | ORAL_TABLET | Freq: Every day | ORAL | Status: DC
  Administered 2020-11-13 – 2020-11-15 (×3): 25 mg via ORAL
  Filled 2020-11-13 (×3): qty 1

## 2020-11-13 MED ORDER — PHENOL 1.4 % MT LIQD: 1.0000 | OROMUCOSAL | Status: DC | PRN

## 2020-11-13 MED ORDER — BUPIVACAINE LIPOSOME 1.3 % IJ SUSP
INTRAMUSCULAR | Status: DC | PRN
  Administered 2020-11-13: 20 mL

## 2020-11-13 MED ORDER — ONDANSETRON HCL 4 MG/2ML IJ SOLN
INTRAMUSCULAR | Status: DC | PRN
  Administered 2020-11-13: 4 mg via INTRAVENOUS

## 2020-11-13 MED ORDER — HYDROMORPHONE HCL 1 MG/ML IJ SOLN
0.5000 mg | INTRAMUSCULAR | Status: DC | PRN
  Administered 2020-11-13 (×2): 0.5 mg via INTRAVENOUS
  Filled 2020-11-13: qty 0.5
  Filled 2020-11-13: qty 1

## 2020-11-13 MED ORDER — METOCLOPRAMIDE HCL 5 MG/ML IJ SOLN
5.0000 mg | Freq: Three times a day (TID) | INTRAMUSCULAR | Status: DC | PRN
Start: 2020-11-13 — End: 2020-11-15

## 2020-11-13 MED ORDER — BUPIVACAINE-EPINEPHRINE (PF) 0.5% -1:200000 IJ SOLN
INTRAMUSCULAR | Status: DC | PRN
  Administered 2020-11-13: 20 mL

## 2020-11-13 MED ORDER — BUPIVACAINE LIPOSOME 1.3 % IJ SUSP
INTRAMUSCULAR | Status: AC
  Filled 2020-11-13: qty 20

## 2020-11-13 MED ORDER — ONDANSETRON HCL 4 MG/2ML IJ SOLN
4.0000 mg | Freq: Once | INTRAMUSCULAR | Status: AC | PRN
  Administered 2020-11-13: 4 mg via INTRAVENOUS
  Filled 2020-11-13: qty 2

## 2020-11-13 MED ORDER — MIDAZOLAM HCL 5 MG/5ML IJ SOLN
INTRAMUSCULAR | Status: DC | PRN
  Administered 2020-11-13 (×2): 1 mg via INTRAVENOUS

## 2020-11-13 MED ORDER — HYDROMORPHONE HCL 1 MG/ML IJ SOLN
0.2500 mg | INTRAMUSCULAR | Status: DC | PRN
  Administered 2020-11-13 – 2020-11-15 (×4): 0.5 mg via INTRAVENOUS
  Filled 2020-11-13 (×4): qty 0.5

## 2020-11-13 MED ORDER — ONDANSETRON HCL 4 MG PO TABS: 4.0000 mg | ORAL_TABLET | Freq: Four times a day (QID) | ORAL | Status: DC | PRN

## 2020-11-13 MED ORDER — MIDAZOLAM HCL 2 MG/2ML IJ SOLN
INTRAMUSCULAR | Status: AC
  Filled 2020-11-13: qty 2

## 2020-11-13 MED ORDER — PROPOFOL 500 MG/50ML IV EMUL
INTRAVENOUS | Status: DC | PRN
  Administered 2020-11-13: 50 ug/kg/min via INTRAVENOUS

## 2020-11-13 MED ORDER — PANTOPRAZOLE SODIUM 40 MG PO TBEC
40.0000 mg | DELAYED_RELEASE_TABLET | Freq: Two times a day (BID) | ORAL | Status: DC
  Administered 2020-11-13 – 2020-11-15 (×4): 40 mg via ORAL
  Filled 2020-11-13 (×5): qty 1

## 2020-11-13 MED ORDER — ONDANSETRON HCL 4 MG/2ML IJ SOLN
4.0000 mg | Freq: Four times a day (QID) | INTRAMUSCULAR | Status: DC | PRN
  Administered 2020-11-14: 4 mg via INTRAVENOUS
  Filled 2020-11-13: qty 2

## 2020-11-13 MED ORDER — EPHEDRINE 5 MG/ML INJ
INTRAVENOUS | Status: AC
  Filled 2020-11-13: qty 10

## 2020-11-13 MED ORDER — ACYCLOVIR 800 MG PO TABS
400.0000 mg | ORAL_TABLET | Freq: Every day | ORAL | Status: DC
  Administered 2020-11-14 – 2020-11-15 (×2): 400 mg via ORAL
  Filled 2020-11-13 (×2): qty 1

## 2020-11-13 SURGICAL SUPPLY — 58 items
APL PRP STRL LF DISP 70% ISPRP (MISCELLANEOUS) ×2
BIPOLAR PROS AML 45 (Hips) ×2 IMPLANT
BIT DRILL 2.8X128 (BIT) ×2 IMPLANT
BLADE SAGITTAL 25.0X1.27X90 (BLADE) ×2 IMPLANT
CHLORAPREP W/TINT 26 (MISCELLANEOUS) ×3 IMPLANT
CLOTH BEACON ORANGE TIMEOUT ST (SAFETY) ×2 IMPLANT
COVER LIGHT HANDLE STERIS (MISCELLANEOUS) ×4 IMPLANT
COVER WAND RF STERILE (DRAPES) ×2 IMPLANT
DECANTER SPIKE VIAL GLASS SM (MISCELLANEOUS) ×2 IMPLANT
DRAPE HIP W/POCKET STRL (MISCELLANEOUS) ×2 IMPLANT
DRAPE U-SHAPE 47X51 STRL (DRAPES) ×2 IMPLANT
DRSG MEPILEX BORDER 4X12 (GAUZE/BANDAGES/DRESSINGS) ×2 IMPLANT
DRSG MEPILEX SACRM 8.7X9.8 (GAUZE/BANDAGES/DRESSINGS) ×2 IMPLANT
ELECT REM PT RETURN 9FT ADLT (ELECTROSURGICAL) ×2
ELECTRODE REM PT RTRN 9FT ADLT (ELECTROSURGICAL) ×1 IMPLANT
GLOVE BIOGEL M 6.5 STRL (GLOVE) ×1 IMPLANT
GLOVE BIOGEL PI IND STRL 6 (GLOVE) IMPLANT
GLOVE BIOGEL PI INDICATOR 6 (GLOVE) ×2
GLOVE SKINSENSE NS SZ8.0 LF (GLOVE) ×2
GLOVE SKINSENSE STRL SZ8.0 LF (GLOVE) ×2 IMPLANT
GLOVE SS N UNI LF 8.5 STRL (GLOVE) ×3 IMPLANT
GLOVE SURG UNDER POLY LF SZ7 (GLOVE) ×6 IMPLANT
GOWN STRL REUS W/TWL LRG LVL3 (GOWN DISPOSABLE) ×4 IMPLANT
GOWN STRL REUS W/TWL XL LVL3 (GOWN DISPOSABLE) ×2 IMPLANT
HANDPIECE INTERPULSE COAX TIP (DISPOSABLE) ×2
HEAD BIPOLAR PROS AML 45 (Hips) IMPLANT
HEAD FEM STD 28X+8.5 STRL (Hips) ×1 IMPLANT
INST SET MAJOR BONE (KITS) ×2 IMPLANT
KIT BLADEGUARD II DBL (SET/KITS/TRAYS/PACK) ×2 IMPLANT
KIT TURNOVER KIT A (KITS) ×2 IMPLANT
MANIFOLD NEPTUNE II (INSTRUMENTS) ×2 IMPLANT
MARKER SKIN DUAL TIP RULER LAB (MISCELLANEOUS) ×2 IMPLANT
NDL HYPO 18GX1.5 BLUNT FILL (NEEDLE) ×1 IMPLANT
NDL HYPO 21X1.5 SAFETY (NEEDLE) ×1 IMPLANT
NEEDLE HYPO 18GX1.5 BLUNT FILL (NEEDLE) ×2 IMPLANT
NEEDLE HYPO 21X1.5 SAFETY (NEEDLE) ×2 IMPLANT
NS IRRIG 1000ML POUR BTL (IV SOLUTION) ×2 IMPLANT
PACK TOTAL JOINT (CUSTOM PROCEDURE TRAY) ×2 IMPLANT
PAD ARMBOARD 7.5X6 YLW CONV (MISCELLANEOUS) ×2 IMPLANT
PENCIL SMOKE EVACUATOR (MISCELLANEOUS) ×2 IMPLANT
PIN STMN SNGL STERILE 9X3.6MM (PIN) ×4 IMPLANT
SET BASIN LINEN APH (SET/KITS/TRAYS/PACK) ×2 IMPLANT
SET HNDPC FAN SPRY TIP SCT (DISPOSABLE) ×1 IMPLANT
STAPLER VISISTAT 35W (STAPLE) ×2 IMPLANT
STEM SUMMIT BASIC PRESSFIT SZ3 (Hips) IMPLANT
SUMMIT BASIC PRESSFIT SZ3 (Hips) ×2 IMPLANT
SUT BRALON NAB BRD #1 30IN (SUTURE) ×4 IMPLANT
SUT ETHIBOND 5 LR DA (SUTURE) ×4 IMPLANT
SUT MNCRL 0 VIOLET CTX 36 (SUTURE) ×1 IMPLANT
SUT MON AB 2-0 CT1 36 (SUTURE) ×2 IMPLANT
SUT MONOCRYL 0 CTX 36 (SUTURE) ×4
SUT VIC AB 1 CT1 27 (SUTURE) ×6
SUT VIC AB 1 CT1 27XBRD ANTBC (SUTURE) ×2 IMPLANT
SYR 20ML LL LF (SYRINGE) ×6 IMPLANT
SYR BULB IRRIG 60ML STRL (SYRINGE) ×2 IMPLANT
TRAY FOLEY MTR SLVR 16FR STAT (SET/KITS/TRAYS/PACK) ×2 IMPLANT
WATER STERILE IRR 1000ML POUR (IV SOLUTION) ×4 IMPLANT
YANKAUER SUCT 12FT TUBE ARGYLE (SUCTIONS) ×2 IMPLANT

## 2020-11-13 NOTE — Brief Op Note (Signed)
11/13/2020  5:30 PM  PATIENT:  April Beck  75 y.o. female  PRE-OPERATIVE DIAGNOSIS:  right hip fracture, femoral neck   POST-OPERATIVE DIAGNOSIS:  right hip fracture, femoral neck   PROCEDURE:  Procedure(s): ARTHROPLASTY BIPOLAR HIP (HEMIARTHROPLASTY) (Right)   DEPUY 3PF STEM , 45 HEAD - OUTER; 28 INNER + 8.5   APPROACH: DIRECT LATERAL  Complications: We are concerned of a possible aspiration into the left lung field.  FINDINGS: Comminuted fracture femoral neck completely displaced.  Acetabulum no arthritis  SURGEON:  Surgeon(s) and Role:    * Carole Civil, MD - Primary  PHYSICIAN ASSISTANT:   ASSISTANTS: Fulton Mole  ANESTHESIA:   spinal  EBL:  200 mL   BLOOD ADMINISTERED:none  DRAINS: none   LOCAL MEDICATIONS USED:  MARCAINE    and OTHER 20 cc exparel  SPECIMEN:  No Specimen  DISPOSITION OF SPECIMEN:  N/A  COUNTS:  YES  TOURNIQUET:  * No tourniquets in log *  DICTATION: .Dragon Dictation  PLAN OF CARE: Admit to inpatient   PATIENT DISPOSITION:  PACU - hemodynamically stable.   Delay start of Pharmacological VTE agent (>24hrs) due to surgical blood loss or risk of bleeding: yes  Details of surgery  The patient was seen in the preop area and the surgical site was confirmed his right hip and marked.  Chart review was completed and updated  Implants were checked.  X-rays were available in the room  Patient was taken to the operating for spinal anesthesia followed by insertion of Foley catheter then she was placed in the lateral decubitus position right side up with an axillary roll and padding on the dependent left lower extremity  After sterile prep and drape and timeout surgery was started at 1601  The approach was direct lateral.  The skin incision was made over the greater trochanter extended proximally and distally and extended down to the fascia.  The fascia was split in line with the skin incision followed by greater trochanteric  bursectomy.  The anterior third of the gluteus medius was identified bluntly dissected down to the trochanter and then with the leg in external rotation this tissue was removed from the trochanter.  This was separated from the capsular layer retracted proximally and held with 2 Steinmann pins.  Capsulectomy was then performed.  The hip was dislocated intact impacted into the femoral neck.  However it was a complete fracture and it was displaced.  The acetabulum was inspected after irrigation and found to have no degenerative arthritis.  Fracture bone fragments were removed as the fracture was comminuted  It was a low oblique fracture.  Therefore we had to make a lower than normal neck cut.  The lesser trochanter was identified the neck cut was made with the leg in neutral position to the ground.  The starter hole reamer was passed followed by the box osteotome followed by canal finder and then a size 3 broach was placed.  This fit nicely and the femoral head measured 45 mm.  A +5 and +8.5 neck trial was performed with a 45 mm head and a #3 stem.  The 8.5 restored neck length shuck test gave excellent range of motion and stability including sleeping stability position  Trial components were removed 2 drill holes were placed in the trochanter #5 suture was passed.  Implant was placed and repeat trial reduction was performed.  I was satisfied that the reduction was again stable and proceeded to repair the abductors after irrigation  with Vicryl No. 5 Braylon #1 Braylon. At this point it was noted that we may have had an aspiration  The fascia was then repaired with 0 Monocryl (circulating nurse was helping anesthesia could not get a #1 Braylon at that point)  Subcu layer closed with 0 Monocryl and 2-0 Monocryl  Staples used to reapproximate skin edges  Sterile dressing applied  Patient taken recovery room in stable condition with pending pelvis and chest x-ray  Postop plan  Weight-bear as  tolerated  Direct lateral hip precautions  30 days of DVT prevention with aspirin  Postop visit in 2 weeks, 6 weeks and 12 weeks

## 2020-11-13 NOTE — Addendum Note (Signed)
Addendum  created 11/13/20 1854 by Denese Killings, MD   Order list changed, Pharmacy for encounter modified

## 2020-11-13 NOTE — Transfer of Care (Signed)
Immediate Anesthesia Transfer of Care Note  Patient: April Beck  Procedure(s) Performed: ARTHROPLASTY BIPOLAR HIP (HEMIARTHROPLASTY) (Right Hip)  Patient Location: PACU  Anesthesia Type:General  Level of Consciousness: awake, alert  and oriented  Airway & Oxygen Therapy: Patient Spontanous Breathing and Patient connected to nasal cannula oxygen  Post-op Assessment: Report given to RN and Post -op Vital signs reviewed and stable  Post vital signs: Reviewed and stable  Last Vitals:  Vitals Value Taken Time  BP 113/71 11/13/20 1730  Temp 98 11/13/20 1734  Pulse 78 11/13/20 1733  Resp 20 11/13/20 1733  SpO2 96 % 11/13/20 1733  Vitals shown include unvalidated device data.  Last Pain:  Vitals:   11/13/20 1416  TempSrc: Oral  PainSc:       Patients Stated Pain Goal: 3 (33/44/83 0159)  Complications: No complications documented.
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
## 2020-11-13 NOTE — Interval H&P Note (Signed)
History and Physical Interval Note:  11/13/2020 2:59 PM  April Beck  has presented today for surgery, with the diagnosis of right hip fracture.  The various methods of treatment have been discussed with the patient and family. After consideration of risks, benefits and other options for treatment, the patient has consented to  Procedure(s): ARTHROPLASTY BIPOLAR HIP (HEMIARTHROPLASTY) (Right) as a surgical intervention.  The patient's history has been reviewed, patient examined, no change in status, stable for surgery.  I have reviewed the patient's chart and labs.  Questions were answered to the patient's satisfaction.     Arther Abbott

## 2020-11-13 NOTE — Anesthesia Procedure Notes (Signed)
Spinal  Start time: 11/13/2020 3:16 PM End time: 11/13/2020 3:26 PM Staffing Performed: anesthesiologist  Anesthesiologist: Denese Killings, MD Spinal Block Patient position: left lateral decubitus Prep: Betadine Location: L3-4 Injection technique: single-shot Needle Needle type: Spinocan  Needle length: 10 cm Needle insertion depth: 7 cm

## 2020-11-13 NOTE — Op Note (Signed)
11/13/2020  5:30 PM  PATIENT:  April Beck  75 y.o. female  PRE-OPERATIVE DIAGNOSIS:  right hip fracture, femoral neck   POST-OPERATIVE DIAGNOSIS:  right hip fracture, femoral neck   PROCEDURE:  Procedure(s): ARTHROPLASTY BIPOLAR HIP (HEMIARTHROPLASTY) (Right)   DEPUY 3PF STEM , 45 HEAD - OUTER; 28 INNER + 8.5   APPROACH: DIRECT LATERAL  Complications: We are concerned of a possible aspiration into the left lung field.  FINDINGS: Comminuted fracture femoral neck completely displaced.  Acetabulum no arthritis  SURGEON:  Surgeon(s) and Role:    * Carole Civil, MD - Primary  PHYSICIAN ASSISTANT:   ASSISTANTS: Fulton Mole  ANESTHESIA:   spinal  EBL:  200 mL   BLOOD ADMINISTERED:none  DRAINS: none   LOCAL MEDICATIONS USED:  MARCAINE    and OTHER 20 cc exparel  SPECIMEN:  No Specimen  DISPOSITION OF SPECIMEN:  N/A  COUNTS:  YES  TOURNIQUET:  * No tourniquets in log *  DICTATION: .Dragon Dictation  PLAN OF CARE: Admit to inpatient   PATIENT DISPOSITION:  PACU - hemodynamically stable.   Delay start of Pharmacological VTE agent (>24hrs) due to surgical blood loss or risk of bleeding: yes  Details of surgery  The patient was seen in the preop area and the surgical site was confirmed his right hip and marked.  Chart review was completed and updated  Implants were checked.  X-rays were available in the room  Patient was taken to the operating for spinal anesthesia followed by insertion of Foley catheter then she was placed in the lateral decubitus position right side up with an axillary roll and padding on the dependent left lower extremity  After sterile prep and drape and timeout surgery was started at 1601  The approach was direct lateral.  The skin incision was made over the greater trochanter extended proximally and distally and extended down to the fascia.  The fascia was split in line with the skin incision followed by greater trochanteric  bursectomy.  The anterior third of the gluteus medius was identified bluntly dissected down to the trochanter and then with the leg in external rotation this tissue was removed from the trochanter.  This was separated from the capsular layer retracted proximally and held with 2 Steinmann pins.  Capsulectomy was then performed.  The hip was dislocated intact impacted into the femoral neck.  However it was a complete fracture and it was displaced.  The acetabulum was inspected after irrigation and found to have no degenerative arthritis.  Fracture bone fragments were removed as the fracture was comminuted  It was a low oblique fracture.  Therefore we had to make a lower than normal neck cut.  The lesser trochanter was identified the neck cut was made with the leg in neutral position to the ground.  The starter hole reamer was passed followed by the box osteotome followed by canal finder and then a size 3 broach was placed.  This fit nicely and the femoral head measured 45 mm.  A +5 and +8.5 neck trial was performed with a 45 mm head and a #3 stem.  The 8.5 restored neck length shuck test gave excellent range of motion and stability including sleeping stability position  Trial components were removed 2 drill holes were placed in the trochanter #5 suture was passed.  Implant was placed and repeat trial reduction was performed.  I was satisfied that the reduction was again stable and proceeded to repair the abductors after irrigation  with Vicryl No. 5 Braylon #1 Braylon. At this point it was noted that we may have had an aspiration  The fascia was then repaired with 0 Monocryl (circulating nurse was helping anesthesia could not get a #1 Braylon at that point)  Subcu layer closed with 0 Monocryl and 2-0 Monocryl  Staples used to reapproximate skin edges  Sterile dressing applied  Patient taken recovery room in stable condition with pending pelvis and chest x-ray  Postop plan  Weight-bear as  tolerated  Direct lateral hip precautions  30 days of DVT prevention with aspirin  Postop visit in 2 weeks, 6 weeks and 12 weeks
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
## 2020-11-13 NOTE — Anesthesia Preprocedure Evaluation (Signed)
Anesthesia Evaluation  Patient identified by MRN, date of birth, ID band Patient awake    Reviewed: Allergy & Precautions, NPO status , Patient's Chart, lab work & pertinent test results  History of Anesthesia Complications Negative for: history of anesthetic complications  Airway Mallampati: II  TM Distance: >3 FB Neck ROM: Full    Dental  (+) Dental Advisory Given, Teeth Intact, Caps   Pulmonary neg pulmonary ROS,    Pulmonary exam normal breath sounds clear to auscultation       Cardiovascular Exercise Tolerance: Good hypertension, Pt. on medications Normal cardiovascular exam+ Valvular Problems/Murmurs  Rhythm:Regular Rate:Normal     Neuro/Psych negative neurological ROS  negative psych ROS   GI/Hepatic GERD  Medicated and Controlled,  Endo/Other  Hypothyroidism   Renal/GU Renal InsufficiencyRenal disease     Musculoskeletal  (+) Arthritis  (right hip fx),   Abdominal   Peds  Hematology   Anesthesia Other Findings Hypomagnesemia and hypokalemia, patient will receive potassium and magnesium iv before the procedre.  Reproductive/Obstetrics negative OB ROS                             Anesthesia Physical Anesthesia Plan  ASA: II  Anesthesia Plan: General/Spinal   Post-op Pain Management:    Induction: Intravenous  PONV Risk Score and Plan: 3 and Ondansetron and Dexamethasone  Airway Management Planned: Nasal Cannula, Natural Airway and Simple Face Mask  Additional Equipment:   Intra-op Plan:   Post-operative Plan:   Informed Consent: I have reviewed the patients History and Physical, chart, labs and discussed the procedure including the risks, benefits and alternatives for the proposed anesthesia with the patient or authorized representative who has indicated his/her understanding and acceptance.     Dental advisory given  Plan Discussed with: Surgeon  Anesthesia  Plan Comments: (Will attempt spinal first, possible GA with airway was explained.)        Anesthesia Quick Evaluation

## 2020-11-13 NOTE — Progress Notes (Signed)
PROGRESS NOTE  April Beck DUK:[id number] DOB: 1946-03-19 DOA: 11/12/2020 PCP: Susy Frizzle, MD  Brief History:  75 year old female with a history of hypertension, hyperlipidemia, hypothyroidism, GERD presenting after mechanical fall at her sister's home.  Apparently, the patient tripped over her own feet and sustained a mechanical fall on the evening of 11/12/2020.  There was no loss of consciousness.  The patient denied any prodromal symptoms.  The patient had been in her usual state of health without any complaints prior to admission.  She denied any fevers, chills, chest pain, shortness of breath, cough, hemoptysis, nausea, vomiting, diarrhea, abdominal pain, dysuria. After her fall onto her right side, the patient cannot get up secondary to pain.  EMS was activated.  X-rays in the emergency department showed a right femoral neck fracture.  Orthopedics was consulted to assist with management. Notably, the patient was fully independent with all her ADLs prior to sustaining her mechanical fall.  She denies any previous history of stroke or coronary disease.  Assessment/Plan: Right femoral neck fracture -Orthopedics consulted -The patient is medically stable for surgery without any further medical interventions -DVT prophylaxis per Ortho -PT/OT after surgery -Personally reviewed chest x-ray--no edema or infiltrates -Obtain preoperative EKG  Hypokalemia -Replete  Hypomagnesemia -Replete  Hypertension -Continue HCTZ  CKD stage III -Baseline creatinine 1.0-1.2 -A.m. BMP  Hypothyroidism -Continue Synthroid  Hyperlipidemia -Continue statin     Status is: Inpatient  Remains inpatient appropriate because:Inpatient level of care appropriate due to severity of illness   Dispo: The patient is from: Home              Anticipated d/c is to: Home              Patient currently is not medically stable to d/c.   Difficult to place patient  No        Family Communication:   No Family at bedside  Consultants:  ortho  Code Status:  FULL   DVT Prophylaxis:  [place] Lovenox   Procedures: As Listed in Progress Note Above  Antibiotics: None      Subjective: Patient complains of pain in right hip.  Patient denies fevers, chills, headache, chest pain, dyspnea, nausea, vomiting, diarrhea, abdominal pain, dysuria, hematuria, hematochezia, and melena.   Objective: Vitals:   11/13/20 0118 11/13/20 0121 11/13/20 0134 11/13/20 0618  BP:  (!) 159/76  127/68  Pulse:  77  62  Resp:  20  18  Temp:  98.9 F (37.2 C)  97.6 F (36.4 C)  TempSrc:    Oral  SpO2: 100% 100%  99%  Weight:   84.5 kg   Height:   5' 6.5" (1.689 m)     Intake/Output Summary (Last 24 hours) at 11/13/2020 3762 Last data filed at 11/13/2020 0300 Gross per 24 hour  Intake 535.46 ml  Output --  Net 535.46 ml   Weight change:  Exam:   General:  Pt is alert, follows commands appropriately, not in acute distress  HEENT: No icterus, No thrush, No neck mass, [place]/AT  Cardiovascular: RRR, S1/S2, no rubs, no gallops  Respiratory: CTA bilaterally, no wheezing, no crackles, no rhonchi  Abdomen: Soft/+BS, non tender, non distended, no guarding  Extremities: No edema, No lymphangitis, No petechiae, No rashes, no synovitis   Data Reviewed: I have personally reviewed following labs and imaging studies Basic Metabolic Panel: Recent Labs  Lab 11/12/20 2215 11/13/20 0442  NA 137 139  K 3.5 3.2*  CL 98 101  CO2 28 28  GLUCOSE 124* 131*  BUN 30* 26*  CREATININE 1.21* 1.08*  CALCIUM 9.5 8.9  MG  --  1.1*  PHOS  --  4.1   Liver Function Tests: Recent Labs  Lab 11/13/20 0442  AST 24  ALT 21  ALKPHOS 46  BILITOT 0.6  PROT 5.9*  ALBUMIN 3.5   No results for input(s): LIPASE, AMYLASE in the last 168 hours. No results for input(s): AMMONIA in the last 168 hours. Coagulation Profile: Recent Labs  Lab 11/12/20 2215  INR 0.9    CBC: Recent Labs  Lab 11/12/20 2215 11/13/20 0442  WBC 7.2 8.0  NEUTROABS 5.2  --   HGB 13.2 12.5  HCT 40.4 38.2  MCV 97.1 98.2  PLT 168 170   Cardiac Enzymes: No results for input(s): CKTOTAL, CKMB, CKMBINDEX, TROPONINI in the last 168 hours. BNP: Invalid input(s): POCBNP CBG: No results for input(s): GLUCAP in the last 168 hours. HbA1C: No results for input(s): HGBA1C in the last 72 hours. Urine analysis:    Component Value Date/Time   COLORURINE YELLOW 07/20/2019 [place] 07/20/2019 1433   LABSPEC 1.020 07/20/2019 1433   PHURINE 6.0 07/20/2019 1433   GLUCOSEU NEGATIVE 07/20/2019 1433   HGBUR NEGATIVE 07/20/2019 1433   BILIRUBINUR NEGATIVE 12/13/2015 1605   KETONESUR NEGATIVE 07/20/2019 1433   PROTEINUR NEGATIVE 07/20/2019 1433   NITRITE NEGATIVE 07/20/2019 1433   LEUKOCYTESUR TRACE (A) 07/20/2019 1433   Sepsis Labs: @LABRCNTIP (procalcitonin:4,lacticidven:4) )No results found for this or any previous visit (from the past 240 hour(s)).   Scheduled Meds: . atorvastatin  40 mg Oral Daily  . hydrochlorothiazide  25 mg Oral Daily  . levothyroxine  50 mcg Oral Q0600  . pantoprazole  40 mg Oral BID   Continuous Infusions: . 0.9 % NaCl with KCl 20 mEq / L    . magnesium sulfate bolus IVPB    . potassium chloride      Procedures/Studies: DG Chest 1 View  Result Date: 11/12/2020 CLINICAL DATA:  Status post fall. EXAM: CHEST  1 VIEW COMPARISON:  None. FINDINGS: The heart size and mediastinal contours are within normal limits. Both lungs are clear. Radiopaque surgical clips are seen overlying the right upper quadrant. The visualized skeletal structures are unremarkable. IMPRESSION: No active disease. Electronically Signed   By: Virgina Norfolk M.D.   On: 11/12/2020 23:24   DG Hip Unilat With Pelvis 2-3 Views Right  Result Date: 11/12/2020 CLINICAL DATA:  Status post fall. EXAM: DG HIP (WITH OR WITHOUT PELVIS) 2-3V RIGHT COMPARISON:  July 04, 2015 FINDINGS: An acute fracture deformity is seen extending through the neck of the proximal right femur. There is no evidence of dislocation. Degenerative changes are seen involving both hips in the form of joint space narrowing. Soft tissue structures are unremarkable. IMPRESSION: Acute fracture of the proximal right femur. Electronically Signed   By: Virgina Norfolk M.D.   On: 11/12/2020 23:24    Orson Eva, DO  [facility]  If 7PM-7AM, please contact night-coverage [email or web address] Password [facility] 11/13/2020, 9:39 AM   LOS: 1 day

## 2020-11-13 NOTE — Anesthesia Postprocedure Evaluation (Signed)
Anesthesia Post Note  Patient: Geeta Dworkin  Procedure(s) Performed: ARTHROPLASTY BIPOLAR HIP (HEMIARTHROPLASTY) (Right Hip)  Patient location during evaluation: PACU Anesthesia Type: Combined General/Spinal Level of consciousness: awake and alert and oriented Pain management: pain level controlled Vital Signs Assessment: post-procedure vital signs reviewed and stable Respiratory status: spontaneous breathing, respiratory function stable and patient connected to nasal cannula oxygen Cardiovascular status: blood pressure returned to baseline and stable Postop Assessment: no apparent nausea or vomiting Anesthetic complications: no   No complications documented.   Last Vitals:  Vitals:   11/13/20 1745 11/13/20 1800  BP: (!) 120/59 (!) 128/55  Pulse: 69 70  Resp: (!) 9 10  Temp:    SpO2: 96% 100%    Last Pain:  Vitals:   11/13/20 1745  TempSrc:   PainSc: 0-No pain                 Rajamani C Battula

## 2020-11-14 ENCOUNTER — Encounter (HOSPITAL_COMMUNITY): Payer: Self-pay | Admitting: Orthopedic Surgery

## 2020-11-14 DIAGNOSIS — T17908D Unspecified foreign body in respiratory tract, part unspecified causing other injury, subsequent encounter: Secondary | ICD-10-CM | POA: Diagnosis not present

## 2020-11-14 DIAGNOSIS — S72001G Fracture of unspecified part of neck of right femur, subsequent encounter for closed fracture with delayed healing: Secondary | ICD-10-CM

## 2020-11-14 DIAGNOSIS — T17908A Unspecified foreign body in respiratory tract, part unspecified causing other injury, initial encounter: Secondary | ICD-10-CM

## 2020-11-14 DIAGNOSIS — E038 Other specified hypothyroidism: Secondary | ICD-10-CM | POA: Diagnosis not present

## 2020-11-14 LAB — CBC
HCT: 32.8 % — ABNORMAL LOW (ref 36.0–46.0)
Hemoglobin: 10.5 g/dL — ABNORMAL LOW (ref 12.0–15.0)
MCH: 31.3 pg (ref 26.0–34.0)
MCHC: 32 g/dL (ref 30.0–36.0)
MCV: 97.9 fL (ref 80.0–100.0)
Platelets: 153 10*3/uL (ref 150–400)
RBC: 3.35 MIL/uL — ABNORMAL LOW (ref 3.87–5.11)
RDW: 12.1 % (ref 11.5–15.5)
WBC: 11.1 10*3/uL — ABNORMAL HIGH (ref 4.0–10.5)
nRBC: 0 % (ref 0.0–0.2)

## 2020-11-14 LAB — BASIC METABOLIC PANEL
Anion gap: 9 (ref 5–15)
BUN: 19 mg/dL (ref 8–23)
CO2: 24 mmol/L (ref 22–32)
Calcium: 8 mg/dL — ABNORMAL LOW (ref 8.9–10.3)
Chloride: 101 mmol/L (ref 98–111)
Creatinine, Ser: 1.06 mg/dL — ABNORMAL HIGH (ref 0.44–1.00)
GFR, Estimated: 55 mL/min — ABNORMAL LOW (ref >60)
Glucose, Bld: 148 mg/dL — ABNORMAL HIGH (ref 70–99)
Potassium: 3.8 mmol/L (ref 3.5–5.1)
Sodium: 134 mmol/L — ABNORMAL LOW (ref 135–145)

## 2020-11-14 LAB — MAGNESIUM: Magnesium: 1.5 mg/dL — ABNORMAL LOW (ref 1.7–2.4)

## 2020-11-14 MED ORDER — MAGNESIUM SULFATE 2 GM/50ML IV SOLN
2.0000 g | Freq: Once | INTRAVENOUS | Status: AC
  Administered 2020-11-14: 2 g via INTRAVENOUS
  Filled 2020-11-14: qty 50

## 2020-11-14 MED ORDER — ASPIRIN 325 MG PO TBEC: 325.0000 mg | DELAYED_RELEASE_TABLET | Freq: Every day | ORAL | 0 refills | Status: DC

## 2020-11-14 NOTE — Discharge Summary (Signed)
Physician Discharge Summary  April Beck YQM:[id number] DOB: Mar 06, 1946 DOA: 11/12/2020  PCP: Susy Frizzle, MD  Admit date: 11/12/2020 Discharge date: 11/15/2020  Admitted From: Home Disposition:   SNF  Recommendations for Outpatient Follow-up:  1. Follow up with PCP in 1-2 weeks 2. Please obtain BMP/CBC in one week     Discharge Condition: Stable CODE STATUS: fULL Diet recommendation: Heart Healthy   Brief/Interim Summary: 75 year old female with a history of hypertension, hyperlipidemia, hypothyroidism, GERD presenting after mechanical fall at her sister's home. Apparently, the patient tripped over her own feet and sustained a mechanical fall on the evening of 11/12/2020. There was no loss of consciousness. The patient denied any prodromal symptoms. The patient had been in her usual state of health without any complaints prior to admission. She denied any fevers, chills, chest pain, shortness of breath, cough, hemoptysis, nausea, vomiting, diarrhea, abdominal pain, dysuria. After her fall onto her right side, the patient cannot get up secondary to pain. EMS was activated. X-rays in the emergency department showed a right femoral neck fracture. Orthopedics was consulted to assist with management. Notably, the patient was fully independent with all her ADLs prior to sustaining her mechanical fall. She denies any previous history of stroke or coronary disease.   Discharge Diagnoses:  Right femoral neck fracture -Orthopedics consulted -11/13/20--Right hemiarthroplasty -DVT prophylaxis per Ortho -PT/OT after surgery-->SNF -Personally reviewed chest x-ray--no edema or infiltrates -Obtain preoperative EKG--personally reviewed, sinus, nonspecific T wave change -tramadol not controlling post op pain -Rx oxycodone at time of d/c which can be de-escalated at SNF as she continues to  improve  Hypokalemia -Repleted  Hypomagnesemia -Replete  Hypertension -Continue HCTZ  CKD stage III -Baseline creatinine 1.0-1.2 -A.m. BMP  Hypothyroidism -Continue Synthroid  Hyperlipidemia -Continue statin  Discharge Instructions   Allergies as of 11/15/2020   No Known Allergies     Medication List    STOP taking these medications   benzonatate 100 MG capsule Commonly known as: Tessalon Perles   chlorpheniramine-HYDROcodone 10-8 MG/5ML Suer Commonly known as: Tussionex Pennkinetic ER   diazepam 5 MG tablet Commonly known as: VALIUM   traMADol 50 MG tablet Commonly known as: ULTRAM     TAKE these medications   acetaminophen 325 MG tablet Commonly known as: Tylenol Take 2 tablets (650 mg total) by mouth every 6 (six) hours as needed for pain.   acyclovir 400 MG tablet Commonly known as: ZOVIRAX Take 1 tablet (400 mg total) by mouth daily.   aspirin 325 MG EC tablet Take 1 tablet (325 mg total) by mouth daily with breakfast.   atorvastatin 40 MG tablet Commonly known as: LIPITOR Take 1 tablet (40 mg total) by mouth daily.   citalopram 10 MG tablet Commonly known as: CELEXA Take 1 tablet (10 mg total) by mouth daily.   diphenoxylate-atropine 2.5-0.025 MG tablet Commonly known as: Lomotil Take 2 tablets by mouth 4 (four) times daily as needed for diarrhea or loose stools.   docusate sodium 100 MG capsule Commonly known as: COLACE Take 1 capsule (100 mg total) by mouth 2 (two) times daily.   fluocinonide gel 0.05 % Commonly known as: LIDEX Apply 1 application topically 3 (three) times daily.   hydrochlorothiazide 25 MG tablet Commonly known as: HYDRODIURIL Take 1 tablet (25 mg total) by mouth daily.   Klor-Con M20 20 MEQ tablet Generic drug: potassium chloride SA TAKE 1 TABLET DAILY   levothyroxine 50 MCG tablet Commonly known as: SYNTHROID Take 1 tablet (50 mcg total) by  mouth daily.   meloxicam 15 MG tablet Commonly known as:  MOBIC Take 1 tablet (15 mg total) by mouth daily.   methocarbamol 500 MG tablet Commonly known as: ROBAXIN Take 1 tablet (500 mg total) by mouth every 6 (six) hours as needed for muscle spasms.   oxyCODONE 5 MG immediate release tablet Commonly known as: Oxy IR/ROXICODONE Take 1 tablet (5 mg total) by mouth every 6 (six) hours as needed for moderate pain.   pantoprazole 40 MG tablet Commonly known as: PROTONIX Take 1 tablet (40 mg total) by mouth 2 (two) times daily.       Contact information for after-discharge care    [facility] .   Service: Skilled Nursing Contact information: [address] [phone]                 No Known Allergies  Consultations:  ortho   Procedures/Studies: DG Chest 1 View  Result Date: 11/12/2020 CLINICAL DATA:  Status post fall. EXAM: CHEST  1 VIEW COMPARISON:  None. FINDINGS: The heart size and mediastinal contours are within normal limits. Both lungs are clear. Radiopaque surgical clips are seen overlying the right upper quadrant. The visualized skeletal structures are unremarkable. IMPRESSION: No active disease. Electronically Signed   By: Virgina Norfolk M.D.   On: 11/12/2020 23:24   DG Pelvis Portable  Result Date: 11/13/2020 CLINICAL DATA:  Aspiration post right hip arthroplasty EXAM: PORTABLE PELVIS 1-2 VIEWS COMPARISON:  11/12/2020 right hip radiographs FINDINGS: Interval right hip hemiarthroplasty with well-positioned proximal right femoral prosthesis. No right hip dislocation on these frontal views. No pelvic fracture or diastasis. No focal osseous lesions. Expected soft tissue gas surrounding the right hip. Skin staples lateral to the right hip. IMPRESSION: Satisfactory immediate postoperative appearance status post right hip hemiarthroplasty. Electronically Signed   By: Ilona Sorrel M.D.   On: 11/13/2020 18:15   DG Chest Port 1 View  Result  Date: 11/13/2020 CLINICAL DATA:  Aspiration post right hip arthroplasty EXAM: PORTABLE CHEST 1 VIEW COMPARISON:  Chest radiograph from one day prior. FINDINGS: Stable cardiomediastinal silhouette with normal heart size. No pneumothorax. No pleural effusion. Lungs appear clear, with no acute consolidative airspace disease and no pulmonary edema. Stable small right hemidiaphragm eventration. IMPRESSION: No active disease. Electronically Signed   By: Ilona Sorrel M.D.   On: 11/13/2020 18:16   DG Hip Unilat With Pelvis 2-3 Views Right  Result Date: 11/12/2020 CLINICAL DATA:  Status post fall. EXAM: DG HIP (WITH OR WITHOUT PELVIS) 2-3V RIGHT COMPARISON:  July 04, 2015 FINDINGS: An acute fracture deformity is seen extending through the neck of the proximal right femur. There is no evidence of dislocation. Degenerative changes are seen involving both hips in the form of joint space narrowing. Soft tissue structures are unremarkable. IMPRESSION: Acute fracture of the proximal right femur. Electronically Signed   By: Virgina Norfolk M.D.   On: 11/12/2020 23:24        Discharge Exam: Vitals:   11/14/20 2046 11/15/20 0500  BP: (!) 106/47 121/67  Pulse: 77 72  Resp: 17 16  Temp: 98.2 F (36.8 C) 97.9 F (36.6 C)  SpO2: 96% 96%   Vitals:   11/14/20 0430 11/14/20 1331 11/14/20 2046 11/15/20 0500  BP: 121/61 (!) 113/47 (!) 106/47 121/67  Pulse: 71 72 77 72  Resp: 16 16 17 16   Temp: 97.9 F (36.6 C) 97.9 F (36.6 C) 98.2 F (  36.8 C) 97.9 F (36.6 C)  TempSrc: Oral Oral Oral Oral  SpO2: 99% 94% 96% 96%  Weight:      Height:        General: Pt is alert, awake, not in acute distress Cardiovascular: RRR, S1/S2 +, no rubs, no gallops Respiratory: bibasilar rales. No wheeze Abdominal: Soft, NT, ND, bowel sounds + Extremities: nonpitting edema, no cyanosis   The results of significant diagnostics from this hospitalization (including imaging, microbiology, ancillary and laboratory) are  listed below for reference.    Significant Diagnostic Studies: DG Chest 1 View  Result Date: 11/12/2020 CLINICAL DATA:  Status post fall. EXAM: CHEST  1 VIEW COMPARISON:  None. FINDINGS: The heart size and mediastinal contours are within normal limits. Both lungs are clear. Radiopaque surgical clips are seen overlying the right upper quadrant. The visualized skeletal structures are unremarkable. IMPRESSION: No active disease. Electronically Signed   By: Virgina Norfolk M.D.   On: 11/12/2020 23:24   DG Pelvis Portable  Result Date: 11/13/2020 CLINICAL DATA:  Aspiration post right hip arthroplasty EXAM: PORTABLE PELVIS 1-2 VIEWS COMPARISON:  11/12/2020 right hip radiographs FINDINGS: Interval right hip hemiarthroplasty with well-positioned proximal right femoral prosthesis. No right hip dislocation on these frontal views. No pelvic fracture or diastasis. No focal osseous lesions. Expected soft tissue gas surrounding the right hip. Skin staples lateral to the right hip. IMPRESSION: Satisfactory immediate postoperative appearance status post right hip hemiarthroplasty. Electronically Signed   By: Ilona Sorrel M.D.   On: 11/13/2020 18:15   DG Chest Port 1 View  Result Date: 11/13/2020 CLINICAL DATA:  Aspiration post right hip arthroplasty EXAM: PORTABLE CHEST 1 VIEW COMPARISON:  Chest radiograph from one day prior. FINDINGS: Stable cardiomediastinal silhouette with normal heart size. No pneumothorax. No pleural effusion. Lungs appear clear, with no acute consolidative airspace disease and no pulmonary edema. Stable small right hemidiaphragm eventration. IMPRESSION: No active disease. Electronically Signed   By: Ilona Sorrel M.D.   On: 11/13/2020 18:16   DG Hip Unilat With Pelvis 2-3 Views Right  Result Date: 11/12/2020 CLINICAL DATA:  Status post fall. EXAM: DG HIP (WITH OR WITHOUT PELVIS) 2-3V RIGHT COMPARISON:  July 04, 2015 FINDINGS: An acute fracture deformity is seen extending through the neck  of the proximal right femur. There is no evidence of dislocation. Degenerative changes are seen involving both hips in the form of joint space narrowing. Soft tissue structures are unremarkable. IMPRESSION: Acute fracture of the proximal right femur. Electronically Signed   By: Virgina Norfolk M.D.   On: 11/12/2020 23:24     Microbiology: Recent Results (from the past 240 hour(s))  MRSA PCR Screening     Status: None   Collection Time: 11/13/20 12:14 PM  Result Value Ref Range Status   MRSA by PCR NEGATIVE NEGATIVE Final    Comment:        The GeneXpert MRSA Assay (FDA approved for NASAL specimens only), is one component of a comprehensive MRSA colonization surveillance program. It is not intended to diagnose MRSA infection nor to guide or monitor treatment for MRSA infections. Performed at [facility], [address], [place],  [address]      Labs: Basic Metabolic Panel: Recent Labs  Lab 11/12/20 2215 11/13/20 0442 11/14/20 0554 11/15/20 0502  NA 137 139 134* 138  K 3.5 3.2* 3.8 3.2*  CL 98 101 101 100  CO2 28 28 24 27   GLUCOSE 124* 131* 148* 95  BUN 30* 26* 19 19  CREATININE 1.21* 1.08* 1.06* 1.00  CALCIUM 9.5 8.9 8.0* 8.2*  MG  --  1.1* 1.5* 1.9  PHOS  --  4.1  --   --    Liver Function Tests: Recent Labs  Lab 11/13/20 0442  AST 24  ALT 21  ALKPHOS 46  BILITOT 0.6  PROT 5.9*  ALBUMIN 3.5   No results for input(s): LIPASE, AMYLASE in the last 168 hours. No results for input(s): AMMONIA in the last 168 hours. CBC: Recent Labs  Lab 11/12/20 2215 11/13/20 0442 11/14/20 0554 11/15/20 0502  WBC 7.2 8.0 11.1* 8.6  NEUTROABS 5.2  --   --   --   HGB 13.2 12.5 10.5* 10.1*  HCT 40.4 38.2 32.8* 31.8*  MCV 97.1 98.2 97.9 99.1  PLT 168 170 153 148*   Cardiac Enzymes: No results for input(s): CKTOTAL, CKMB, CKMBINDEX, TROPONINI in the last 168 hours. BNP: Invalid input(s): POCBNP CBG: No results for input(s): GLUCAP in the last 168  hours.  Time coordinating discharge:  36 minutes  Signed:  Orson Eva, DO [facility] Pager: [phone] 11/15/2020, 10:33 AM

## 2020-11-14 NOTE — TOC Initial Note (Addendum)
Transition of Care [facility]) - Initial/Assessment Note    Patient Details  Name: April Beck MRN: [id number] Date of Birth: Aug 11, 1946  [facility] of [facility]) CM/SW Contact:    Natasha Bence, LCSW Phone Number: 11/14/2020, 12:22 PM  Clinical Narrative:                 Patient is a 75 year old female admitted for Fracture of femoral neck, right. Patient is able to perform ADL's independently at baseline. CSW received recommendation for SNF. Patient agreeable to SNF referrals for [facility] and [facility]. Patient also reported that she is fully vaccinated with Booster for Covid 19. CSW faxed referrals. TOC to follow.    Expected Discharge Plan: Skilled Nursing Facility Barriers to Discharge: Continued Medical Work up   Patient Goals and CMS Choice Patient states their goals for this hospitalization and ongoing recovery are:: Rehab with SNF CMS Medicare.gov Compare Post Acute Care list provided to:: Patient Choice offered to / list presented to : Patient  Expected Discharge Plan and Services Expected Discharge Plan: Skilled Nursing Facility     Post Acute Care Choice: NA                   DME Arranged: N/A DME Agency: NA       HH Arranged: NA HH Agency: NA        Prior Living Arrangements/Services   Lives with:: Self Patient language and need for interpreter reviewed:: Yes Do you feel safe going back to the place where you live?: Yes      Need for Family Participation in Patient Care: Yes (Comment) Care giver support system in place?: Yes (comment)   Criminal Activity/Legal Involvement Pertinent to Current Situation/Hospitalization: No - Comment as needed  Activities of Daily Living Home Assistive Devices/Equipment: Contact lenses ADL Screening (condition at time of admission) Patient's cognitive ability adequate to safely complete daily activities?: Yes Is the patient deaf or have difficulty hearing?: No Does the patient have difficulty seeing,  even when wearing glasses/contacts?: No Does the patient have difficulty concentrating, remembering, or making decisions?: No Patient able to express need for assistance with ADLs?: Yes Does the patient have difficulty dressing or bathing?: No Independently performs ADLs?: Yes (appropriate for developmental age) Does the patient have difficulty walking or climbing stairs?: No Weakness of Legs: None Weakness of Arms/Hands: None  Permission Sought/Granted Permission sought to share information with : Family Supports Permission granted to share information with : Yes, Verbal Permission Granted     Permission granted to share info w AGENCY: [facility]F's        Emotional Assessment Appearance:: Appears older than stated age   Affect (typically observed): Accepting,Adaptable,Appropriate Orientation: : Oriented to Self,Oriented to Situation,Oriented to Place,Oriented to  Time Alcohol / Substance Use: Not Applicable Psych Involvement: No (comment)  Admission diagnosis:  Fracture of femoral neck, right ([place]) [S72.001A] Fall, initial encounter [W19.XXXA] Closed right hip fracture, initial encounter ([place]) [S72.001A] Fracture, hip ([place]) [S72.009A] Patient Active Problem List   Diagnosis Date Noted  . Fracture, hip ([place]) 11/13/2020  . Fall   . Fracture of femoral neck, right ([place]) 11/12/2020  . Dysphagia, unspecified(787.20) 06/14/2013  . BV (bacterial vaginosis) 06/14/2013  . Vaginal atrophy 06/14/2013  . Heel pain 06/14/2013  . Hypercholesteremia   . Hypertension   . GERD (gastroesophageal reflux disease)   . Hypothyroidism   . Renal insufficiency 06/19/2012  . Hypothyroid 06/19/2012  . 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

## 2020-11-14 NOTE — Evaluation (Signed)
Physical Therapy Evaluation Patient Details Name: Tawonda Beck MRN: [id number] DOB: 12/27/1945 Today's Date: 11/14/2020   History of Present Illness  April Beck is a 75 y.o. female s/p ARTHROPLASTY BIPOLAR HIP (HEMIARTHROPLASTY) (Right) on 11/13/20 with medical history significant for hypertension, hyperlipidemia, hypothyroidism and GERD who presents to the emergency department after sustaining a fall.  Patient states that she went to her sister-in-law's house yesterday in the evening when she stumbled and fell onto her right side.  She complained of pain and inability to get up from the floor, son activated EMS and patient was taken to the ED for further evaluation and management.  She states that pain radiates from the hip to her right ankle.  Patient denies chest pain, shortness of breath, head injury of loss of consciousness.    Clinical Impression  Patient demonstrates slow labored movement for sitting up at bedside with Mod assist to move RLE due to increased pain, slow labored cadence without loss of balance with fair carryover for right heel to toe stepping due to increased hip pain when weightbearing and tolerated sitting up in chair after therapy - RN aware.  Patient will benefit from continued physical therapy in hospital and recommended venue below to increase strength, balance, endurance for safe ADLs and gait.     Follow Up Recommendations SNF    Equipment Recommendations  Rolling walker with 5" wheels;3in1 (PT)    Recommendations for Other Services       Precautions / Restrictions Precautions Precautions: Fall Restrictions Weight Bearing Restrictions: Yes RLE Weight Bearing: Weight bearing as tolerated      Mobility  Bed Mobility Overal bed mobility: Needs Assistance Bed Mobility: Supine to Sit     Supine to sit: Mod assist;Min assist     General bed mobility comments: increased time, labored movement with assistance to move RLE     Transfers Overall transfer level: Needs assistance Equipment used: Rolling walker (2 wheeled) Transfers: Sit to/from [facility] Sit to Stand: Min assist Stand pivot transfers: Min assist;Mod assist       General transfer comment: increased time, labored movement  Ambulation/Gait Ambulation/Gait assistance: Min assist Gait Distance (Feet): 40 Feet Assistive device: Rolling walker (2 wheeled) Gait Pattern/deviations: Decreased step length - right;Decreased step length - left;Decreased stride length;Antalgic;Decreased stance time - right Gait velocity: decreased   General Gait Details: slow labored cadence with fair return for right heel toe stepping due to increased pain with weightbearing, no loss of balance, limited due to fatigue  Stairs            Wheelchair Mobility    Modified Rankin (Stroke Patients Only)       Balance Overall balance assessment: Needs assistance Sitting-balance support: Feet supported;No upper extremity supported Sitting balance-Leahy Scale: Fair Sitting balance - Comments: fair/good seated at EOB   Standing balance support: During functional activity;Bilateral upper extremity supported Standing balance-Leahy Scale: Fair Standing balance comment: using RW                             Pertinent Vitals/Pain Pain Assessment: 0-10 Pain Score: 5  Pain Location: right hip Pain Descriptors / Indicators: Sore Pain Intervention(s): Limited activity within patient's tolerance;Monitored during session;Premedicated before session;Repositioned    Home Living Family/patient expects to be discharged to:: Private residence Living Arrangements: Alone Available Help at Discharge: Family;Available PRN/intermittently Type of Home: House Home Access: Stairs to enter   [employer] of Steps: 4-5  Home Layout: One level Home Equipment: None      Prior Function Level of Independence: Independent          Comments: Hydrographic surveyor, drives     Hand Dominance   Dominant Hand: Right    Extremity/Trunk Assessment   Upper Extremity Assessment Upper Extremity Assessment: Overall WFL for tasks assessed    Lower Extremity Assessment Lower Extremity Assessment: Generalized weakness    Cervical / Trunk Assessment Cervical / Trunk Assessment: Normal  Communication   Communication: No difficulties  Cognition Arousal/Alertness: Awake/alert Behavior During Therapy: WFL for tasks assessed/performed Overall Cognitive Status: Within Functional Limits for tasks assessed                                        General Comments      Exercises     Assessment/Plan    PT Assessment Patient needs continued PT services  PT Problem List Decreased strength;Decreased activity tolerance;Decreased balance;Decreased mobility       PT Treatment Interventions DME instruction;Gait training;Stair training;Functional mobility training;Therapeutic activities;Therapeutic exercise;Patient/family education;Balance training    PT Goals (Current goals can be found in the Care Plan section)  Acute Rehab PT Goals Patient Stated Goal: return home after rehab PT Goal Formulation: With patient Time For Goal Achievement: 11/28/20 Potential to Achieve Goals: Good    Frequency Min 5X/week   Barriers to discharge        Co-evaluation               AM-PAC PT "6 Clicks" Mobility  Outcome Measure Help needed turning from your back to your side while in a flat bed without using bedrails?: A Lot Help needed moving from lying on your back to sitting on the side of a flat bed without using bedrails?: A Lot Help needed moving to and from a bed to a chair (including a wheelchair)?: A Lot Help needed standing up from a chair using your arms (e.g., wheelchair or bedside chair)?: A Little Help needed to walk in hospital room?: A Little Help needed climbing 3-5 steps with a railing? : A  Lot 6 Click Score: 14    End of Session   Activity Tolerance: Patient tolerated treatment well;Patient limited by fatigue;Patient limited by pain Patient left: in chair;with call bell/phone within reach Nurse Communication: Mobility status PT Visit Diagnosis: Unsteadiness on feet (R26.81);Other abnormalities of gait and mobility (R26.89);Muscle weakness (generalized) (M62.81)    Time: 6767-2094 PT Time Calculation (min) (ACUTE ONLY): 26 min   Charges:   PT Evaluation $PT Eval Moderate Complexity: 1 Mod PT Treatments $Therapeutic Activity: 23-37 mins        11:17 AM, 11/14/20 Lonell Grandchild, MPT Physical Therapist with [facility] [phone] office [phone] mobile phone

## 2020-11-14 NOTE — NC FL2 (Signed)
[place] LEVEL OF CARE SCREENING TOOL     IDENTIFICATION  Patient Name: April Beck Birthdate: 11-10-1945 Sex: female Admission Date (Current Location): 11/12/2020  [facility] and [place] Number:  [employer] and Address:  [facility] [address] [place]      Provider Number: [id number]  Attending Physician Name and Address:  Orson Eva, MD  Relative Name and Phone Number:  Milda Smart)   [phone]    Current Level of Care: Hospital Recommended Level of Care: [place] Prior Approval Number:    Date Approved/Denied: 11/14/20 PASRR Number: [id number]  Discharge Plan: SNF    Current Diagnoses: Patient Active Problem List   Diagnosis Date Noted  . Fracture, hip ([place]) 11/13/2020  . Fall   . Fracture of femoral neck, right ([place]) 11/12/2020  . Dysphagia, unspecified(787.20) 06/14/2013  . BV (bacterial vaginosis) 06/14/2013  . Vaginal atrophy 06/14/2013  . Heel pain 06/14/2013  . Hypercholesteremia   . Hypertension   . GERD (gastroesophageal reflux disease)   . Hypothyroidism   . Renal insufficiency 06/19/2012  . Hypothyroid 06/19/2012  . 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 Yes; Fluid consistency: Thin)  AMBULATORY STATUS COMMUNICATION OF NEEDS Skin   Limited Assist Verbally Normal                       Personal Care Assistance Level of Assistance  Bathing,Feeding,Dressing Bathing Assistance: Limited assistance Feeding assistance: Limited assistance Dressing Assistance: Limited assistance     Functional Limitations Info  Sight,Hearing,Speech Sight Info: Impaired Hearing Info:  Adequate Speech Info: Adequate    SPECIAL CARE FACTORS FREQUENCY                       Contractures Contractures Info: Not present    Additional Factors Info  Code Status,Allergies,Psychotropic Code Status Info: Full Allergies Info: N/A Psychotropic Info: ULTRAM, VALIUM, CELEXA, chlorpheniramine-HYDROcodone         Current Medications (11/14/2020):  This is the current hospital active medication list Current Facility-Administered Medications  Medication Dose Route Frequency Provider Last Rate Last Admin  . acetaminophen (OFIRMEV) IV 1,000 mg  1,000 mg Intravenous Q6H Carole Civil, MD 400 mL/hr at 11/14/20 0015 1,000 mg at 11/14/20 0015  . acetaminophen (TYLENOL) tablet 500 mg  500 mg Oral Q6H Carole Civil, MD      . acyclovir (ZOVIRAX) tablet 400 mg  400 mg Oral Daily Carole Civil, MD   400 mg at 11/14/20 0932  . aspirin EC tablet 325 mg  325 mg Oral Q breakfast Carole Civil, MD   325 mg at 11/14/20 0931  . atorvastatin (LIPITOR) tablet 40 mg  40 mg Oral Daily Carole Civil, MD   40 mg at 11/14/20 0931  . benzonatate (TESSALON) capsule 100 mg  100 mg Oral TID PRN Carole Civil, MD      . Chlorhexidine Gluconate Cloth 2 % PADS 6 each  6 each Topical Daily Carole Civil, MD   6 each at 11/13/20 2100  . chlorpheniramine-HYDROcodone (TUSSIONEX) 10-8 MG/5ML suspension 5 mL  5 mL Oral Q12H PRN Carole Civil, MD      .  citalopram (CELEXA) tablet 10 mg  10 mg Oral Daily Carole Civil, MD   10 mg at 11/14/20 0931  . diphenoxylate-atropine (LOMOTIL) 2.5-0.025 MG per tablet 2 tablet  2 tablet Oral QID PRN Carole Civil, MD      . docusate sodium (COLACE) capsule 100 mg  100 mg Oral BID Carole Civil, MD   100 mg at 11/14/20 0930  . hydrochlorothiazide (HYDRODIURIL) tablet 25 mg  25 mg Oral Daily Carole Civil, MD   25 mg at 11/14/20 0930  . HYDROmorphone (DILAUDID) injection 0.25-0.5 mg  0.25-0.5 mg Intravenous  Q5 min PRN Carole Civil, MD   0.5 mg at 11/13/20 2009  . levothyroxine (SYNTHROID) tablet 50 mcg  50 mcg Oral Q0600 Carole Civil, MD   50 mcg at 11/14/20 0931  . menthol-cetylpyridinium (CEPACOL) lozenge 3 mg  1 lozenge Oral PRN Carole Civil, MD       Or  . phenol (CHLORASEPTIC) mouth spray 1 spray  1 spray Mouth/Throat PRN Carole Civil, MD      . methocarbamol (ROBAXIN) tablet 500 mg  500 mg Oral Q6H PRN Carole Civil, MD       Or  . methocarbamol (ROBAXIN) 500 mg in dextrose 5 % 50 mL IVPB  500 mg Intravenous Q6H PRN Carole Civil, MD      . metoCLOPramide (REGLAN) tablet 5-10 mg  5-10 mg Oral Q8H PRN Carole Civil, MD       Or  . metoCLOPramide (REGLAN) injection 5-10 mg  5-10 mg Intravenous Q8H PRN Carole Civil, MD      . morphine 2 MG/ML injection 0.5-1 mg  0.5-1 mg Intravenous Q2H PRN Carole Civil, MD      . ondansetron [facility]) tablet 4 mg  4 mg Oral Q6H PRN Carole Civil, MD       Or  . ondansetron [facility]) injection 4 mg  4 mg Intravenous Q6H PRN Carole Civil, MD   4 mg at 11/14/20 0755  . pantoprazole (PROTONIX) EC tablet 40 mg  40 mg Oral BID Carole Civil, MD   40 mg at 11/14/20 0930  . potassium chloride SA (KLOR-CON) CR tablet 20 mEq  20 mEq Oral Daily Carole Civil, MD   20 mEq at 11/14/20 0932  . traMADol (ULTRAM) tablet 50 mg  50 mg Oral Q6H PRN Carole Civil, MD   50 mg at 11/14/20 9833     Discharge Medications: Please see discharge summary for a list of discharge medications.  Relevant Imaging Results:  Relevant Lab Results:   Additional Information Pt SSN: [id number]  Natasha Bence, LCSW

## 2020-11-14 NOTE — Plan of Care (Signed)
  Problem: Acute Rehab PT Goals(only PT should resolve) Goal: Pt Will Go Supine/Side To Sit Outcome: Progressing Flowsheets (Taken 11/14/2020 1118) Pt will go Supine/Side to Sit: with minimal assist Goal: Patient Will Transfer Sit To/From Stand Outcome: Progressing Flowsheets (Taken 11/14/2020 1118) Patient will transfer sit to/from stand:  with min guard assist  with minimal assist Goal: Pt Will Transfer Bed To Chair/Chair To Bed Outcome: Progressing Flowsheets (Taken 11/14/2020 1118) Pt will Transfer Bed to Chair/Chair to Bed:  min guard assist  with min assist Goal: Pt Will Ambulate Outcome: Progressing Flowsheets (Taken 11/14/2020 1118) Pt will Ambulate:  50 feet  with min guard assist  with minimal assist  with rolling walker   11:19 AM, 11/14/20 Lonell Grandchild, MPT Physical Therapist with [facility] [phone] office [phone] mobile phone

## 2020-11-14 NOTE — TOC Progression Note (Signed)
Transition of Care [facility]) - Progression Note    Patient Details  Name: April Beck MRN: [id number] Date of Birth: [birth year]  Transition of [facility]) CM/SW Contact  Natasha Bence, LCSW Phone Number: 11/14/2020, 4:26 PM  Clinical Narrative:    Marianna Fuss with [facility] agreeable to take patient on 11/15/20. TOC to follow.   Expected Discharge Plan: [place] Barriers to Discharge: Continued Medical Work up  Expected Discharge Plan and Services Expected Discharge Plan: [place]     Post Acute Care Choice: NA                   DME Arranged: N/A DME Agency: NA       HH Arranged: NA HH Agency: NA         Social Determinants of Health (SDOH) Interventions    Readmission Risk Interventions No flowsheet data found.

## 2020-11-14 NOTE — Progress Notes (Signed)
PROGRESS NOTE  April Beck GYF:[id number] DOB: 07-01-1946 DOA: 11/12/2020 PCP: April Frizzle, MD   Brief History:  75 year old female with a history of hypertension, hyperlipidemia, hypothyroidism, GERD presenting after mechanical fall at her sister's home.  Apparently, the patient tripped over her own feet and sustained a mechanical fall on the evening of 11/12/2020.  There was no loss of consciousness.  The patient denied any prodromal symptoms.  The patient had been in her usual state of health without any complaints prior to admission.  She denied any fevers, chills, chest pain, shortness of breath, cough, hemoptysis, nausea, vomiting, diarrhea, abdominal pain, dysuria. After her fall onto her right side, the patient cannot get up secondary to pain.  EMS was activated.  X-rays in the emergency department showed a right femoral neck fracture.  Orthopedics was consulted to assist with management. Notably, the patient was fully independent with all her ADLs prior to sustaining her mechanical fall.  She denies any previous history of stroke or coronary disease.  Assessment/Plan: Right femoral neck fracture -Orthopedics consulted -11/13/20--Right hemiarthroplasty -DVT prophylaxis per Ortho -PT/OT after surgery-->SNF -Personally reviewed chest x-ray--no edema or infiltrates -Obtain preoperative EKG--personally reviewed, sinus, nonspecific T wave change  Hypokalemia -Repleted  Hypomagnesemia -Replete  Hypertension -Continue HCTZ  CKD stage III -Baseline creatinine 1.0-1.2 -A.m. BMP  Hypothyroidism -Continue Synthroid  Hyperlipidemia -Continue statin     Status is: Inpatient  Remains inpatient appropriate because:Inpatient level of care appropriate due to severity of illness   Dispo: The patient is from: Home  Anticipated d/c is to: SNF  Patient currently is not medically stable to d/c.              Difficult  to place patient No        Family Communication:   son updated at bedside 11/14/20  Consultants:  ortho  Code Status:  FULL   DVT Prophylaxis:  April Beck Lovenox   Procedures: As Listed in Progress Note Above  Antibiotics: None   Subjective: Patient denies fevers, chills, headache, chest pain, dyspnea, nausea, vomiting, diarrhea, abdominal pain, dysuria, hematuria, hematochezia, and melena.   Objective: Vitals:   11/13/20 2030 11/14/20 0030 11/14/20 0430 11/14/20 1331  BP: 127/62 124/67 121/61 (!) 113/47  Pulse: 75 77 71 72  Resp: 14 16 16 16   Temp: 97.8 F (36.6 C) 97.9 F (36.6 C) 97.9 F (36.6 C) 97.9 F (36.6 C)  TempSrc: Oral Oral Oral Oral  SpO2: 98% 99% 99% 94%  Weight:      Height:        Intake/Output Summary (Last 24 hours) at 11/14/2020 1346 Last data filed at 11/14/2020 1300 Gross per 24 hour  Intake 3883.79 ml  Output 1400 ml  Net 2483.79 ml   Weight change:  Exam:   General:  Pt is alert, follows commands appropriately, not in acute distress  HEENT: No icterus, No thrush, No neck mass, [place]/AT  Cardiovascular: RRR, S1/S2, no rubs, no gallops  Respiratory: fine bibasilar rales. No wheeze  Abdomen: Soft/+BS, non tender, non distended, no guarding  Extremities: No edema, No lymphangitis, No petechiae, No rashes, no synovitis   Data Reviewed: I have personally reviewed following labs and imaging studies Basic Metabolic Panel: Recent Labs  Lab 11/12/20 2215 11/13/20 0442 11/14/20 0554  NA 137 139 134*  K 3.5 3.2* 3.8  CL 98 101 101  CO2 28 28 24   GLUCOSE 124* 131* 148*  BUN 30* 26*  19  CREATININE 1.21* 1.08* 1.06*  CALCIUM 9.5 8.9 8.0*  MG  --  1.1* 1.5*  PHOS  --  4.1  --    Liver Function Tests: Recent Labs  Lab 11/13/20 0442  AST 24  ALT 21  ALKPHOS 46  BILITOT 0.6  PROT 5.9*  ALBUMIN 3.5   No results for input(s): LIPASE, AMYLASE in the last 168 hours. No results for input(s): AMMONIA in the last 168  hours. Coagulation Profile: Recent Labs  Lab 11/12/20 2215  INR 0.9   CBC: Recent Labs  Lab 11/12/20 2215 11/13/20 0442 11/14/20 0554  WBC 7.2 8.0 11.1*  NEUTROABS 5.2  --   --   HGB 13.2 12.5 10.5*  HCT 40.4 38.2 32.8*  MCV 97.1 98.2 97.9  PLT 168 170 153   Cardiac Enzymes: No results for input(s): CKTOTAL, CKMB, CKMBINDEX, TROPONINI in the last 168 hours. BNP: Invalid input(s): POCBNP CBG: No results for input(s): GLUCAP in the last 168 hours. HbA1C: No results for input(s): HGBA1C in the last 72 hours. Urine analysis:    Component Value Date/Time   COLORURINE YELLOW 07/20/2019 [place] 07/20/2019 1433   LABSPEC 1.020 07/20/2019 1433   PHURINE 6.0 07/20/2019 1433   GLUCOSEU NEGATIVE 07/20/2019 1433   HGBUR NEGATIVE 07/20/2019 1433   BILIRUBINUR NEGATIVE 12/13/2015 1605   KETONESUR NEGATIVE 07/20/2019 1433   PROTEINUR NEGATIVE 07/20/2019 1433   NITRITE NEGATIVE 07/20/2019 1433   LEUKOCYTESUR TRACE (A) 07/20/2019 1433   Sepsis Labs: @LABRCNTIP (procalcitonin:4,lacticidven:4) ) Recent Results (from the past 240 hour(s))  MRSA PCR Screening     Status: None   Collection Time: 11/13/20 12:14 PM  Result Value Ref Range Status   MRSA by PCR NEGATIVE NEGATIVE Final    Comment:        The GeneXpert MRSA Assay (FDA approved for NASAL specimens only), is one component of a comprehensive MRSA colonization surveillance program. It is not intended to diagnose MRSA infection nor to guide or monitor treatment for MRSA infections. Performed at [facility], [address] [place], [place] [address]      Scheduled Meds: . acetaminophen  500 mg Oral Q6H  . acyclovir  400 mg Oral Daily  . aspirin EC  325 mg Oral Q breakfast  . atorvastatin  40 mg Oral Daily  . Chlorhexidine Gluconate Cloth  6 each Topical Daily  . citalopram  10 mg Oral Daily  . docusate sodium  100 mg Oral BID  . hydrochlorothiazide  25 mg Oral Daily  . levothyroxine  50 mcg  Oral Q0600  . pantoprazole  40 mg Oral BID  . potassium chloride SA  20 mEq Oral Daily   Continuous Infusions: . acetaminophen 1,000 mg (11/14/20 0015)  . methocarbamol (ROBAXIN) IV      Procedures/Studies: DG Chest 1 View  Result Date: 11/12/2020 CLINICAL DATA:  Status post fall. EXAM: CHEST  1 VIEW COMPARISON:  None. FINDINGS: The heart size and mediastinal contours are within normal limits. Both lungs are clear. Radiopaque surgical clips are seen overlying the right upper quadrant. The visualized skeletal structures are unremarkable. IMPRESSION: No active disease. Electronically Signed   By: Virgina Norfolk M.D.   On: 11/12/2020 23:24   DG Pelvis Portable  Result Date: 11/13/2020 CLINICAL DATA:  Aspiration post right hip arthroplasty EXAM: PORTABLE PELVIS 1-2 VIEWS COMPARISON:  11/12/2020 right hip radiographs FINDINGS: Interval right hip hemiarthroplasty with well-positioned proximal right femoral prosthesis. No right hip dislocation on these frontal views.  No pelvic fracture or diastasis. No focal osseous lesions. Expected soft tissue gas surrounding the right hip. Skin staples lateral to the right hip. IMPRESSION: Satisfactory immediate postoperative appearance status post right hip hemiarthroplasty. Electronically Signed   By: Ilona Sorrel M.D.   On: 11/13/2020 18:15   DG Chest Port 1 View  Result Date: 11/13/2020 CLINICAL DATA:  Aspiration post right hip arthroplasty EXAM: PORTABLE CHEST 1 VIEW COMPARISON:  Chest radiograph from one day prior. FINDINGS: Stable cardiomediastinal silhouette with normal heart size. No pneumothorax. No pleural effusion. Lungs appear clear, with no acute consolidative airspace disease and no pulmonary edema. Stable small right hemidiaphragm eventration. IMPRESSION: No active disease. Electronically Signed   By: Ilona Sorrel M.D.   On: 11/13/2020 18:16   DG Hip Unilat With Pelvis 2-3 Views Right  Result Date: 11/12/2020 CLINICAL DATA:  Status post fall.  EXAM: DG HIP (WITH OR WITHOUT PELVIS) 2-3V RIGHT COMPARISON:  July 04, 2015 FINDINGS: An acute fracture deformity is seen extending through the neck of the proximal right femur. There is no evidence of dislocation. Degenerative changes are seen involving both hips in the form of joint space narrowing. Soft tissue structures are unremarkable. IMPRESSION: Acute fracture of the proximal right femur. Electronically Signed   By: Virgina Norfolk M.D.   On: 11/12/2020 23:24    Orson Eva, DO  [facility]  If 7PM-7AM, please contact night-coverage [email or web address] Password [email or web address] 11/14/2020, 1:46 PM   LOS: 2 days

## 2020-11-15 ENCOUNTER — Inpatient Hospital Stay
Admission: RE | Admit: 2020-11-15 | Discharge: 2020-11-26 | Disposition: A | Discharge status: Home / Self Care | Payer: Medicare Other | Source: Ambulatory Visit | Attending: Internal Medicine | Admitting: Internal Medicine

## 2020-11-15 DIAGNOSIS — W19XXXD Unspecified fall, subsequent encounter: Secondary | ICD-10-CM

## 2020-11-15 DIAGNOSIS — R531 Weakness: Secondary | ICD-10-CM | POA: Diagnosis not present

## 2020-11-15 DIAGNOSIS — Z96641 Presence of right artificial hip joint: Secondary | ICD-10-CM | POA: Diagnosis not present

## 2020-11-15 DIAGNOSIS — M159 Polyosteoarthritis, unspecified: Secondary | ICD-10-CM | POA: Diagnosis not present

## 2020-11-15 DIAGNOSIS — N289 Disorder of kidney and ureter, unspecified: Secondary | ICD-10-CM | POA: Diagnosis not present

## 2020-11-15 DIAGNOSIS — E039 Hypothyroidism, unspecified: Secondary | ICD-10-CM | POA: Diagnosis not present

## 2020-11-15 DIAGNOSIS — F339 Major depressive disorder, recurrent, unspecified: Secondary | ICD-10-CM | POA: Diagnosis not present

## 2020-11-15 DIAGNOSIS — S72001D Fracture of unspecified part of neck of right femur, subsequent encounter for closed fracture with routine healing: Secondary | ICD-10-CM | POA: Diagnosis not present

## 2020-11-15 DIAGNOSIS — N183 Chronic kidney disease, stage 3 unspecified: Secondary | ICD-10-CM | POA: Diagnosis not present

## 2020-11-15 DIAGNOSIS — E038 Other specified hypothyroidism: Secondary | ICD-10-CM | POA: Diagnosis not present

## 2020-11-15 DIAGNOSIS — M8580 Other specified disorders of bone density and structure, unspecified site: Secondary | ICD-10-CM | POA: Diagnosis not present

## 2020-11-15 DIAGNOSIS — Z8616 Personal history of COVID-19: Secondary | ICD-10-CM | POA: Diagnosis not present

## 2020-11-15 DIAGNOSIS — R262 Difficulty in walking, not elsewhere classified: Secondary | ICD-10-CM | POA: Diagnosis not present

## 2020-11-15 DIAGNOSIS — E785 Hyperlipidemia, unspecified: Secondary | ICD-10-CM | POA: Diagnosis not present

## 2020-11-15 DIAGNOSIS — K219 Gastro-esophageal reflux disease without esophagitis: Secondary | ICD-10-CM | POA: Diagnosis not present

## 2020-11-15 DIAGNOSIS — I10 Essential (primary) hypertension: Secondary | ICD-10-CM | POA: Diagnosis not present

## 2020-11-15 DIAGNOSIS — B009 Herpesviral infection, unspecified: Secondary | ICD-10-CM | POA: Diagnosis not present

## 2020-11-15 DIAGNOSIS — N1831 Chronic kidney disease, stage 3a: Secondary | ICD-10-CM | POA: Diagnosis not present

## 2020-11-15 DIAGNOSIS — A6004 Herpesviral vulvovaginitis: Secondary | ICD-10-CM | POA: Diagnosis not present

## 2020-11-15 DIAGNOSIS — M6281 Muscle weakness (generalized): Secondary | ICD-10-CM | POA: Diagnosis not present

## 2020-11-15 DIAGNOSIS — E7800 Pure hypercholesterolemia, unspecified: Secondary | ICD-10-CM | POA: Diagnosis not present

## 2020-11-15 DIAGNOSIS — K5909 Other constipation: Secondary | ICD-10-CM | POA: Diagnosis not present

## 2020-11-15 DIAGNOSIS — F411 Generalized anxiety disorder: Secondary | ICD-10-CM | POA: Diagnosis not present

## 2020-11-15 DIAGNOSIS — D62 Acute posthemorrhagic anemia: Secondary | ICD-10-CM | POA: Diagnosis not present

## 2020-11-15 DIAGNOSIS — R2681 Unsteadiness on feet: Secondary | ICD-10-CM | POA: Diagnosis not present

## 2020-11-15 DIAGNOSIS — E876 Hypokalemia: Secondary | ICD-10-CM | POA: Diagnosis not present

## 2020-11-15 DIAGNOSIS — Z9181 History of falling: Secondary | ICD-10-CM | POA: Diagnosis not present

## 2020-11-15 LAB — CBC
HCT: 31.8 % — ABNORMAL LOW (ref 36.0–46.0)
Hemoglobin: 10.1 g/dL — ABNORMAL LOW (ref 12.0–15.0)
MCH: 31.5 pg (ref 26.0–34.0)
MCHC: 31.8 g/dL (ref 30.0–36.0)
MCV: 99.1 fL (ref 80.0–100.0)
Platelets: 148 10*3/uL — ABNORMAL LOW (ref 150–400)
RBC: 3.21 MIL/uL — ABNORMAL LOW (ref 3.87–5.11)
RDW: 12.5 % (ref 11.5–15.5)
WBC: 8.6 10*3/uL (ref 4.0–10.5)
nRBC: 0 % (ref 0.0–0.2)

## 2020-11-15 LAB — BASIC METABOLIC PANEL
Anion gap: 11 (ref 5–15)
BUN: 19 mg/dL (ref 8–23)
CO2: 27 mmol/L (ref 22–32)
Calcium: 8.2 mg/dL — ABNORMAL LOW (ref 8.9–10.3)
Chloride: 100 mmol/L (ref 98–111)
Creatinine, Ser: 1 mg/dL (ref 0.44–1.00)
GFR, Estimated: 59 mL/min — ABNORMAL LOW (ref >60)
Glucose, Bld: 95 mg/dL (ref 70–99)
Potassium: 3.2 mmol/L — ABNORMAL LOW (ref 3.5–5.1)
Sodium: 138 mmol/L (ref 135–145)

## 2020-11-15 LAB — MAGNESIUM: Magnesium: 1.9 mg/dL (ref 1.7–2.4)

## 2020-11-15 MED ORDER — OXYCODONE HCL 5 MG PO TABS: 5.0000 mg | ORAL_TABLET | Freq: Four times a day (QID) | ORAL | 0 refills | Status: DC | PRN

## 2020-11-15 MED ORDER — DOCUSATE SODIUM 100 MG PO CAPS: 100.0000 mg | ORAL_CAPSULE | Freq: Two times a day (BID) | ORAL | 0 refills | Status: DC

## 2020-11-15 MED ORDER — OXYCODONE HCL 5 MG PO TABS
5.0000 mg | ORAL_TABLET | Freq: Four times a day (QID) | ORAL | Status: DC | PRN
  Administered 2020-11-15: 5 mg via ORAL
  Filled 2020-11-15: qty 1

## 2020-11-15 MED ORDER — TRAMADOL HCL 50 MG PO TABS
50.0000 mg | ORAL_TABLET | Freq: Four times a day (QID) | ORAL | 0 refills | Status: DC | PRN
Start: 2020-11-15 — End: 2020-11-15

## 2020-11-15 MED ORDER — METHOCARBAMOL 500 MG PO TABS: 500.0000 mg | ORAL_TABLET | Freq: Four times a day (QID) | ORAL | Status: DC | PRN

## 2020-11-15 NOTE — Plan of Care (Signed)

## 2020-11-15 NOTE — Care Management Important Message (Signed)
Important Message  Patient Details  Name: April Beck MRN: [id number] Date of Birth: Aug 08, 1946   Medicare Important Message Given:  Yes     Tommy Medal 11/15/2020, 12:03 PM

## 2020-11-15 NOTE — Progress Notes (Signed)
Physical Therapy Treatment Patient Details Name: April Beck MRN: [id number] DOB: 10/25/1945 Today's Date: 11/15/2020    History of Present Illness April Beck is a 75 y.o. female s/p ARTHROPLASTY BIPOLAR HIP (HEMIARTHROPLASTY) (Right) on 11/13/20 with medical history significant for hypertension, hyperlipidemia, hypothyroidism and GERD who presents to the emergency department after sustaining a fall.  Patient states that she went to her sister-in-law's house yesterday in the evening when she stumbled and fell onto her right side.  She complained of pain and inability to get up from the floor, son activated EMS and patient was taken to the ED for further evaluation and management.  She states that pain radiates from the hip to her right ankle.  Patient denies chest pain, shortness of breath, head injury of loss of consciousness.    PT Comments    Patient instructed in and given written instruction for bed exercises with good carryover demonstrated, requires verbal/tactile cueing for completing supine sitting by propping up on elbows to hands with fair/good return, demonstrates slightly increased endurance/distance for gait training with fair/good carryover for right heel to toe stepping without loss of balance.  Patient tolerated sitting up in chair after therapy with her son present in room.  Patient will benefit from continued physical therapy in hospital and recommended venue below to increase strength, balance, endurance for safe ADLs and gait.    Follow Up Recommendations  SNF     Equipment Recommendations  Rolling walker with 5" wheels;3in1 (PT)    Recommendations for Other Services       Precautions / Restrictions Precautions Precautions: Fall Precaution Comments: Direct Lateral precautions Restrictions Weight Bearing Restrictions: Yes RLE Weight Bearing: Weight bearing as tolerated    Mobility  Bed Mobility Overal bed mobility: Needs Assistance Bed  Mobility: Supine to Sit     Supine to sit: Min assist;Mod assist     General bed mobility comments: demonstrates fair return for propping up on elbows during supine to sitting with verbal/tactile cueing    Transfers Overall transfer level: Needs assistance Equipment used: Rolling walker (2 wheeled) Transfers: Sit to/from [facility] Sit to Stand: Min assist Stand pivot transfers: Min assist       General transfer comment: increased time, labored movement, required less assistance during transfer to chair  Ambulation/Gait Ambulation/Gait assistance: Min assist Gait Distance (Feet): 55 Feet Assistive device: Rolling walker (2 wheeled) Gait Pattern/deviations: Decreased step length - right;Decreased step length - left;Decreased stride length;Antalgic;Decreased stance time - right Gait velocity: decreased   General Gait Details: slightly increased endurance/distance for ambulation with fair return for right heel to toe stepping without loss of balance, limited secondary to fatigue and mild c/o nausea   Stairs             Wheelchair Mobility    Modified Rankin (Stroke Patients Only)       Balance Overall balance assessment: Needs assistance Sitting-balance support: Feet supported Sitting balance-Leahy Scale: Good Sitting balance - Comments: seated at EOB   Standing balance support: During functional activity;Bilateral upper extremity supported Standing balance-Leahy Scale: Fair Standing balance comment: using RW                            Cognition Arousal/Alertness: Awake/alert Behavior During Therapy: WFL for tasks assessed/performed Overall Cognitive Status: Within Functional Limits for tasks assessed  Exercises Total Joint Exercises Ankle Circles/Pumps: Supine;10 reps;Both;Strengthening;AROM Quad Sets: Supine;10 reps;Both;Strengthening;AROM Gluteal Sets: Supine;10  reps;Both;Strengthening;AROM Short Arc Quad: Supine;10 reps;Both;Strengthening;AROM Heel Slides: Supine;10 reps;Both;Strengthening;AROM    General Comments        Pertinent Vitals/Pain Pain Assessment: 0-10 Pain Score: 5  Pain Location: right hip Pain Descriptors / Indicators: Sore Pain Intervention(s): Limited activity within patient's tolerance;Monitored during session;Premedicated before session    Home Living                      Prior Function            PT Goals (current goals can now be found in the care plan section) Acute Rehab PT Goals Patient Stated Goal: return home after rehab PT Goal Formulation: With patient Time For Goal Achievement: 11/28/20 Potential to Achieve Goals: Good Progress towards PT goals: Progressing toward goals    Frequency    Min 5X/week      PT Plan      Co-evaluation              AM-PAC PT "6 Clicks" Mobility   Outcome Measure  Help needed turning from your back to your side while in a flat bed without using bedrails?: A Lot Help needed moving from lying on your back to sitting on the side of a flat bed without using bedrails?: A Lot Help needed moving to and from a bed to a chair (including a wheelchair)?: A Little Help needed standing up from a chair using your arms (e.g., wheelchair or bedside chair)?: A Little Help needed to walk in hospital room?: A Little Help needed climbing 3-5 steps with a railing? : A Lot 6 Click Score: 15    End of Session   Activity Tolerance: Patient tolerated treatment well;Patient limited by fatigue;Patient limited by pain Patient left: in chair;with call bell/phone within reach;with family/visitor present Nurse Communication: Mobility status PT Visit Diagnosis: Unsteadiness on feet (R26.81);Other abnormalities of gait and mobility (R26.89);Muscle weakness (generalized) (M62.81)     Time: 0539-7673 PT Time Calculation (min) (ACUTE ONLY): 33 min  Charges:  $Gait Training:  8-22 mins $Therapeutic Exercise: 8-22 mins                     1:56 PM, 11/15/20 Lonell Grandchild, MPT Physical Therapist with [facility] [phone] office [phone] mobile phone

## 2020-11-15 NOTE — TOC Transition Note (Signed)
Transition of Care [facility]) - CM/SW Discharge Note   Patient Details  Name: April Beck MRN: [id number] Date of Birth: [birth year]  [facility]) CM/SW Contact:  Salome Arnt, LCSW Phone Number: 11/15/2020, 10:41 AM   Clinical Narrative:  Pt d/c today to [facility]. Pt, pt's son in room, and SNF notified. Per SNF, COVID test not necessary prior to d/c. D/C summary sent to facility. RN given number to call report. Pt will transfer with SNF staff.      Final next level of care: Skilled Nursing Facility Barriers to Discharge: Barriers Resolved   Patient Goals and CMS Choice Patient states their goals for this hospitalization and ongoing recovery are:: Rehab with SNF CMS Medicare.gov Compare Post Acute Care list provided to:: Patient Choice offered to / list presented to : Patient  Discharge Placement   Existing PASRR number confirmed : 11/15/20          Patient chooses bed at: [facility] Patient to be transferred to facility by: [facility] staff.   Patient and family notified of of transfer: 11/15/20  Discharge Plan and Services     Post Acute Care Choice: NA          DME Arranged: N/A DME Agency: NA       HH Arranged: NA HH Agency: NA        Social Determinants of Health (SDOH) Interventions     Readmission Risk Interventions No flowsheet data found.

## 2020-11-15 NOTE — Progress Notes (Signed)
Patient ID: April Beck, female   DOB: 1946/03/03, 75 y.o.   MRN: [id number] POD 2   RT HIP BIPOLAR HEMIARTHROPLASTY  BP 121/67 (BP Location: Left Arm)   Pulse 72   Temp 97.9 F (36.6 C) (Oral)   Resp 16   Ht 5' 6.5" (1.689 m)   Wt 84.5 kg   SpO2 96%   BMI 29.62 kg/m  CBC Latest Ref Rng & Units 11/15/2020 11/14/2020 11/13/2020  WBC 4.0 - 10.5 K/uL 8.6 11.1(H) 8.0  Hemoglobin 12.0 - 15.0 g/dL 10.1(L) 10.5(L) 12.5  Hematocrit 36.0 - 46.0 % 31.8(L) 32.8(L) 38.2  Platelets 150 - 400 K/uL 148(L) 153 170   BMP Latest Ref Rng & Units 11/14/2020 11/13/2020 11/12/2020  Glucose 70 - 99 mg/dL 148(H) 131(H) 124(H)  BUN 8 - 23 mg/dL 19 26(H) 30(H)  Creatinine 0.44 - 1.00 mg/dL 1.06(H) 1.08(H) 1.21(H)  BUN/Creat Ratio 6 - 22 (calc) - - -  Sodium 135 - 145 mmol/L 134(L) 139 137  Potassium 3.5 - 5.1 mmol/L 3.8 3.2(L) 3.5  Chloride 98 - 111 mmol/L 101 101 98  CO2 22 - 32 mmol/L 24 28 28   Calcium 8.9 - 10.3 mg/dL 8.0(L) 8.9 9.5   Postop plan  Weight-bear as tolerated  Direct lateral hip precautions  30 days of DVT prevention with aspirin  Postop visit in 2 weeks, 6 weeks and 12 weeks

## 2020-11-16 ENCOUNTER — Non-Acute Institutional Stay (SKILLED_NURSING_FACILITY): Payer: Medicare Other | Admitting: Adult Health

## 2020-11-16 DIAGNOSIS — E7800 Pure hypercholesterolemia, unspecified: Secondary | ICD-10-CM

## 2020-11-16 DIAGNOSIS — N1831 Chronic kidney disease, stage 3a: Secondary | ICD-10-CM | POA: Diagnosis not present

## 2020-11-16 DIAGNOSIS — S72001D Fracture of unspecified part of neck of right femur, subsequent encounter for closed fracture with routine healing: Secondary | ICD-10-CM | POA: Diagnosis not present

## 2020-11-16 DIAGNOSIS — I10 Essential (primary) hypertension: Secondary | ICD-10-CM | POA: Diagnosis not present

## 2020-11-16 DIAGNOSIS — D62 Acute posthemorrhagic anemia: Secondary | ICD-10-CM

## 2020-11-16 DIAGNOSIS — K5909 Other constipation: Secondary | ICD-10-CM

## 2020-11-16 DIAGNOSIS — E038 Other specified hypothyroidism: Secondary | ICD-10-CM | POA: Diagnosis not present

## 2020-11-16 DIAGNOSIS — K219 Gastro-esophageal reflux disease without esophagitis: Secondary | ICD-10-CM | POA: Diagnosis not present

## 2020-11-16 DIAGNOSIS — F339 Major depressive disorder, recurrent, unspecified: Secondary | ICD-10-CM | POA: Diagnosis not present

## 2020-11-16 DIAGNOSIS — B009 Herpesviral infection, unspecified: Secondary | ICD-10-CM | POA: Diagnosis not present

## 2020-11-16 DIAGNOSIS — E876 Hypokalemia: Secondary | ICD-10-CM | POA: Diagnosis not present
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
## 2020-11-17 ENCOUNTER — Other Ambulatory Visit: Payer: Self-pay | Admitting: Adult Health

## 2020-11-17 ENCOUNTER — Encounter: Payer: Self-pay | Admitting: Internal Medicine

## 2020-11-17 ENCOUNTER — Non-Acute Institutional Stay (SKILLED_NURSING_FACILITY): Payer: Medicare Other | Admitting: Internal Medicine

## 2020-11-17 DIAGNOSIS — I10 Essential (primary) hypertension: Secondary | ICD-10-CM

## 2020-11-17 DIAGNOSIS — S72001D Fracture of unspecified part of neck of right femur, subsequent encounter for closed fracture with routine healing: Secondary | ICD-10-CM | POA: Diagnosis not present

## 2020-11-17 DIAGNOSIS — N289 Disorder of kidney and ureter, unspecified: Secondary | ICD-10-CM

## 2020-11-17 DIAGNOSIS — R531 Weakness: Secondary | ICD-10-CM | POA: Diagnosis not present

## 2020-11-17 MED ORDER — OXYCODONE HCL 5 MG PO TABS: 5.0000 mg | ORAL_TABLET | Freq: Four times a day (QID) | ORAL | 0 refills | Status: DC | PRN

## 2020-11-17 NOTE — Assessment & Plan Note (Signed)
Renal function & BP can be monitored on the spironolactone.

## 2020-11-17 NOTE — Assessment & Plan Note (Addendum)
  There is no family history of MI; but I encouraged her to see her PCP and rule out any paroxysmal dysrrhythymia or atypical anginal equivalent.

## 2020-11-17 NOTE — Patient Instructions (Signed)
See assessment and plan under each diagnosis in the problem list and acutely for this visit 

## 2020-11-17 NOTE — Assessment & Plan Note (Signed)
BP controlled. No change in antihypertensive medications would normally be anticipated; but she is on HCTZ with associated hypokalemia requiring supplementation.  With normal renal function; spironolactone alone may suffice.

## 2020-11-17 NOTE — Assessment & Plan Note (Addendum)
PT/OT at SNF. Orthopedic follow-up in 2 weeks. D/C Robaxin as soon as possible due to high risk of sedation and recurrent fall.

## 2020-11-17 NOTE — Progress Notes (Signed)
NURSING HOME LOCATION:  [facility] ROOM NUMBER:  [id number]  CODE STATUS:  Full Code  PCP:  Jenna Luo MD  This is a comprehensive admission note to this SNFperformed on this date less than 30 days from date of admission. Included are preadmission medical/surgical history; reconciled medication list; family history; social history and comprehensive review of systems.  Corrections and additions to the records were documented. Comprehensive physical exam was also performed. Additionally a clinical summary was entered for each active diagnosis pertinent to this admission in the Problem List to enhance continuity of care.  HPI: Patient was hospitalized 3/12-3/15/2022 having sustained a right femoral neck fracture in a mechanical fall on the evening of 3/12. 3/13 Dr. Arther Abbott performed a right bipolar hip hemiarthroplasty.  Clinically there was some concern for possible aspiration, but portable chest x-ray 3/13 revealed no active disease. At admission H/H was 13.2/40.4.  Nadir H/H was 10.1/31.8. Postop she received oxycodone as tramadol was not controlling the pain.  DVT prophylaxis was to be with aspirin x30 days. Course complicated by hypokalemia and hypomagnesemia which were repleted.  Nadir magnesium was 1.5 but rose to 1.9.  Past medical and surgical history: Includes essential hypertension, CKD stage III,hypothyroidism, dyslipidemia, GERD, and history of cholecystitis. Surgeries and procedures include hysterectomy and cholecystectomy.  Social history: Nondrinker; never smoked.  Family history: Extensive history reviewed.  Father had a stroke but there is no family history of MI.   Review of systems: She is alert and oriented and an excellent historian.  She categorically denies any neurologic or cardiologic prodrome prior to the fall stating that she simply tripped.  Pain control is good at this time and she is taking the opioids as needed only.  Since surgery  she has had constipation. She states that she previously had hypertension but the HCTZ was prescribed for swelling of her feet and sometimes her hands. For 1-2 years she describes vague symptomatology when she gets over heated.  She states that if she feels hot she will get wet and clammy and somewhat "woozy" requiring her to lie down.  This has also occurred when she works in her yard in the fall.  Constitutional: No fever, significant weight change  Eyes: No redness, discharge, pain, vision change ENT/mouth: No nasal congestion, purulent discharge, earache, change in hearing, sore throat  Cardiovascular: No chest pain, palpitations, paroxysmal nocturnal dyspnea, claudication, edema  Respiratory: No cough, sputum production, hemoptysis, DOE, significant snoring, apnea Gastrointestinal: No heartburn, dysphagia, abdominal pain, nausea /vomiting, rectal bleeding, melena, change in bowels Genitourinary: No dysuria, hematuria, pyuria, incontinence, nocturia Musculoskeletal: No joint stiffness, joint swelling, weakness Dermatologic: No rash, pruritus, change in appearance of skin Neurologic: No headache, syncope, seizures, numbness, tingling Psychiatric: No significant anxiety, depression, insomnia, anorexia Endocrine: No change in hair/skin/nails, excessive thirst, excessive hunger, excessive urination  Hematologic/lymphatic: No significant bruising, lymphadenopathy, abnormal bleeding Allergy/immunology: No itchy/watery eyes, significant sneezing, urticaria, angioedema  Physical exam:  Pertinent or positive findings: She is well-dressed well-groomed.  Dental hygiene is immaculate.  There is insignificant edema at the sock line.  General appearance: Adequately nourished; no acute distress, increased work of breathing is present.   Lymphatic: No lymphadenopathy about the head, neck, axilla. Eyes: No conjunctival inflammation or lid edema is present. There is no scleral icterus. Ears:  External ear  exam shows no significant lesions or deformities.   Nose:  External nasal examination shows no deformity or inflammation. Nasal mucosa are pink and moist without lesions, exudates  Oral exam: Lips and gums are healthy appearing.There is no oropharyngeal erythema or exudate. Neck:  No thyromegaly, masses, tenderness noted.    Heart:  Normal rate and regular rhythm. S1 and S2 normal without gallop, murmur, click, rub.  Lungs: Chest clear to auscultation without wheezes, rhonchi, rales, rubs. Abdomen: Bowel sounds are normal.  Abdomen is soft and nontender with no organomegaly, hernias, masses. GU: Deferred  Extremities:  No cyanosis, clubbing. Neurologic exam: Balance, Rhomberg, finger to nose testing could not be completed due to clinical state Skin: Warm & dry w/o tenting. No significant lesions or rash.  See clinical summary under each active problem in the Problem List with associated updated therapeutic plan

## 2020-11-21 ENCOUNTER — Other Ambulatory Visit (HOSPITAL_COMMUNITY)
Admission: RE | Admit: 2020-11-21 | Discharge: 2020-11-21 | Disposition: A | Discharge status: Home / Self Care | Payer: Medicare Other | Source: Skilled Nursing Facility | Attending: Adult Health | Admitting: Adult Health

## 2020-11-21 DIAGNOSIS — S72001A Fracture of unspecified part of neck of right femur, initial encounter for closed fracture: Secondary | ICD-10-CM | POA: Diagnosis not present

## 2020-11-21 DIAGNOSIS — E876 Hypokalemia: Secondary | ICD-10-CM | POA: Insufficient documentation

## 2020-11-21 DIAGNOSIS — N183 Chronic kidney disease, stage 3 unspecified: Secondary | ICD-10-CM | POA: Insufficient documentation

## 2020-11-21 DIAGNOSIS — B009 Herpesviral infection, unspecified: Secondary | ICD-10-CM | POA: Insufficient documentation

## 2020-11-21 DIAGNOSIS — F339 Major depressive disorder, recurrent, unspecified: Secondary | ICD-10-CM | POA: Insufficient documentation

## 2020-11-21 DIAGNOSIS — D62 Acute posthemorrhagic anemia: Secondary | ICD-10-CM | POA: Insufficient documentation

## 2020-11-21 DIAGNOSIS — K5909 Other constipation: Secondary | ICD-10-CM | POA: Insufficient documentation

## 2020-11-21 LAB — BASIC METABOLIC PANEL
Anion gap: 11 (ref 5–15)
BUN: 21 mg/dL (ref 8–23)
CO2: 27 mmol/L (ref 22–32)
Calcium: 8.7 mg/dL — ABNORMAL LOW (ref 8.9–10.3)
Chloride: 102 mmol/L (ref 98–111)
Creatinine, Ser: 0.91 mg/dL (ref 0.44–1.00)
GFR, Estimated: >60 mL/min (ref >60)
Glucose, Bld: 94 mg/dL (ref 70–99)
Potassium: 3.5 mmol/L (ref 3.5–5.1)
Sodium: 140 mmol/L (ref 135–145)

## 2020-11-21 LAB — HEMOGLOBIN AND HEMATOCRIT, BLOOD
HCT: 30.2 % — ABNORMAL LOW (ref 36.0–46.0)
Hemoglobin: 9.6 g/dL — ABNORMAL LOW (ref 12.0–15.0)

## 2020-11-22 ENCOUNTER — Non-Acute Institutional Stay (SKILLED_NURSING_FACILITY): Payer: Medicare Other | Admitting: Adult Health

## 2020-11-22 ENCOUNTER — Encounter: Payer: Self-pay | Admitting: Adult Health

## 2020-11-22 ENCOUNTER — Encounter: Payer: Self-pay | Admitting: Internal Medicine

## 2020-11-22 DIAGNOSIS — S72001D Fracture of unspecified part of neck of right femur, subsequent encounter for closed fracture with routine healing: Secondary | ICD-10-CM

## 2020-11-22 DIAGNOSIS — F339 Major depressive disorder, recurrent, unspecified: Secondary | ICD-10-CM | POA: Diagnosis not present

## 2020-11-22 DIAGNOSIS — D62 Acute posthemorrhagic anemia: Secondary | ICD-10-CM

## 2020-11-22 DIAGNOSIS — N1831 Chronic kidney disease, stage 3a: Secondary | ICD-10-CM
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
## 2020-11-23 ENCOUNTER — Encounter: Payer: Self-pay | Admitting: Orthopedic Surgery

## 2020-11-23 ENCOUNTER — Inpatient Hospital Stay: Payer: Medicare Other

## 2020-11-23 ENCOUNTER — Other Ambulatory Visit: Payer: Self-pay | Admitting: *Deleted

## 2020-11-23 ENCOUNTER — Ambulatory Visit (INDEPENDENT_AMBULATORY_CARE_PROVIDER_SITE_OTHER): Payer: Medicare Other | Admitting: Orthopedic Surgery

## 2020-11-23 DIAGNOSIS — S72001D Fracture of unspecified part of neck of right femur, subsequent encounter for closed fracture with routine healing: Secondary | ICD-10-CM

## 2020-11-23 MED ORDER — HYDROCODONE-ACETAMINOPHEN 5-325 MG PO TABS: 1.0000 | ORAL_TABLET | Freq: Four times a day (QID) | ORAL | 0 refills | Status: DC | PRN

## 2020-11-23 NOTE — Progress Notes (Signed)
Meds ordered this encounter  Medications  . HYDROcodone-acetaminophen (NORCO/VICODIN) 5-325 MG tablet    Sig: Take 1 tablet by mouth every 6 (six) hours as needed for moderate pain.    Dispense:  30 tablet    Refill:  0   April Beck had a bipolar replacement of her right hip on 13 March she did well with that she is here for 10-day follow-up.  She says yesterday they made her kick a ball she had acute pain in her right thigh and she felt something click.  I am not sure why they did that as that is not part of our protocol  She can move the leg today fine she has no increased tenderness her wound is clean dry and intact staple line intact no erythema or drainage those can be taken out in 4 days  Took an x-ray  She had several questions which I answered she can drive in 4 weeks from surgery  She wanted some more pain medication I put her on hydrocodone instead of oxycodone  She is okay to go home on Saturday with a walker  Follow-up in 4 weeks  Encounter Diagnosis  Name Primary?  . Closed fracture of right hip with routine healing, subsequent encounter 11/13/20 Yes

## 2020-11-23 NOTE — Patient Outreach (Signed)
Member screened for potential [facility] needs.  April Beck is residing in [facility]. Update received from [facility] indicating member will transition to home with her son and daughter in law for a little while post SNF.   Member will transition to home on Saturday, March 26th.  SNF SW reports April Beck will have [facility] home health and rolling walker will be ordered.  Telephone call made to April Beck [phone] to discuss potential [facility] needs. Explained that her PCP office has a [place] Coordination team.   April Beck states she will stay with her son and daughter in law for a week or so post SNF.   Discussed the importance of follow up with PCP post SNF. Facility SW reports PCP appointment will be made as a part of the facility's routine discharge paperwork process.  No identifiable care coordination needs at this time.    Marthenia Rolling, MSN, RN,BSN [place] Acute Care Coordinator [phone] [facility]) [phone]  (Toll free office)

## 2020-11-23 NOTE — Patient Instructions (Signed)
Continue therapy you can drive on April 13  Okay to go home on Saturday

## 2020-11-24 ENCOUNTER — Telehealth: Payer: Self-pay | Admitting: Orthopedic Surgery

## 2020-11-24 ENCOUNTER — Other Ambulatory Visit (HOSPITAL_COMMUNITY)
Admission: RE | Admit: 2020-11-24 | Discharge: 2020-11-24 | Disposition: A | Discharge status: Home / Self Care | Payer: Medicare Other | Source: Skilled Nursing Facility | Attending: Adult Health | Admitting: Adult Health

## 2020-11-24 ENCOUNTER — Inpatient Hospital Stay: Payer: 59 | Admitting: Rehabilitative and Restorative Service Providers"

## 2020-11-24 DIAGNOSIS — S72001D Fracture of unspecified part of neck of right femur, subsequent encounter for closed fracture with routine healing: Secondary | ICD-10-CM

## 2020-11-24 DIAGNOSIS — I10 Essential (primary) hypertension: Secondary | ICD-10-CM | POA: Insufficient documentation

## 2020-11-24 LAB — CBC
HCT: 34.1 % — ABNORMAL LOW (ref 36.0–46.0)
Hemoglobin: 11 g/dL — ABNORMAL LOW (ref 12.0–15.0)
MCH: 32.2 pg (ref 26.0–34.0)
MCHC: 32.3 g/dL (ref 30.0–36.0)
MCV: 99.7 fL (ref 80.0–100.0)
Platelets: 404 10*3/uL — ABNORMAL HIGH (ref 150–400)
RBC: 3.42 MIL/uL — ABNORMAL LOW (ref 3.87–5.11)
RDW: 13.6 % (ref 11.5–15.5)
WBC: 8 10*3/uL (ref 4.0–10.5)
nRBC: 0 % (ref 0.0–0.2)

## 2020-11-24 MED ORDER — HYDROCODONE-ACETAMINOPHEN 5-325 MG PO TABS: 1.0000 | ORAL_TABLET | Freq: Four times a day (QID) | ORAL | 0 refills | Status: DC | PRN

## 2020-11-24 NOTE — Telephone Encounter (Signed)
The prescription printed I need to know what pharmacy they use at [facility] I will have to find out.

## 2020-11-24 NOTE — Addendum Note (Signed)
Addended by: Arther Abbott E on: 11/24/2020 12:16 PM   Modules accepted: Orders

## 2020-11-24 NOTE — Addendum Note (Signed)
Addended by: Carole Civil on: 11/24/2020 12:22 PM   Modules accepted: Orders

## 2020-11-24 NOTE — Telephone Encounter (Signed)
I called and spoke to patient.  [facility] [address] in [place].  She said she only wanted a couple of pills for PT.  She is going home on Saturday

## 2020-11-24 NOTE — Telephone Encounter (Addendum)
Patient called and left voicemail.  She states that she saw Dr. Aline Brochure yesterday and he gave her a prescription for Hydrocodone to take when she has PT.  She said that she is at [facility] and they wont fill the prescription for her.  She asks that someone call her back.  Would you give her a call please?   Thanks

## 2020-11-24 NOTE — Telephone Encounter (Signed)
I gave her a written Rx   Also I e scribed this 2 x and both times it printed because error message in pharmacy info

## 2020-11-24 NOTE — Telephone Encounter (Signed)
[phone] is number I called and it rang and rang. Finally someone answered told me the pharmacy is  She told me it is [facility]) and the number is [phone] fax [phone]  I called the number she gave, and it is pharmacare  Abigail Butts found the pharmacy can you send in the Hydrocodone for her?

## 2020-11-24 NOTE — Progress Notes (Deleted)
Name:Helen [name] Age: 75 y.o.   Date of Service: 11/24/2020  Referring Physician: Raleigh Callas, MD   Date of Injury: No data was found  Date Care Plan Established/Reviewed: No data was found  Date Treatment Started: No data was found  End of Certification Date: No data was found  Sessions in Plan of Care: No data was found  Surgery Date: No data was found  MD Follow-up: No data was found  Medbridge Code: No data was found    Visit Count: Visit count could not be calculated. Make sure you are using a visit which is associated with an episode.   Diagnosis: No diagnosis found.    Subjective     History of Present Illness   Mechanism of injury: fall on ice on 09/05/20  History of Present Illness: After fall on ice - went to ED. 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 NO ACUTE FRACTURE.          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    Bilateral cataracts     Breast cancer     Breast lump     Cancer     breast cancer    Carcinoma     Carcinoma of the bladder    Chicken pox     Childhood illness    Conduction Disorder     RBBB    Diabetes mellitus type II     Non-Insulin dependent    Echocardiogram 10/2013    Gastroesophageal reflux disease     Hemorrhoids without complication     Holter monitor 02/2014    Hyperlipidemia     Hypertensive disorder     Malignant neoplasm of breast     Measles     Childhood illness    Mumps     Childhood illness    Myocardial perfusion scan 11/2013, 12/2013    MPI single Isotope excrise; MPI dual Isotope Lexiscan    RBBB     Syncope              Objective:  (11/24/20)  Observation: ***  Integumentary: {integumentary:52720}  Posture: Deficits noted: {Posture:21024027}    Gait: {PT/OT Gait:21024082}   Neurologic/Sensation: Right {sensation-spr:52514}, Left {sensation-spr:52514}    Cervical ROM (deg) 11/24/20  AROM 11/24/20  PROM   Cervical Flexion     Cervical Extension     Lumbar Flexion     Lumbar Extension     (blank fields were intentionally left blank)    11/24/20  PROM  LEFT 11/24/20  AROM  LEFT Cervical/Shoulder ROM (deg) 11/24/20  AROM  RIGHT 11/24/20  PROM  RIGHT     Cervical Lateral Flexion       Cervical Rotation       Thoracic Rotation       Shoulder Flexion       Shoulder Extension       Shoulder Abduction  Shoulder Internal Rotation       Shoulder External Rotation       Functional IR (hand behind back)       Functional ER (hand behind head)     (blank fields were intentionally left blank)  (All measurements assessed in sitting, with exception of shoulder internal and external rotation in supine, unless otherwise specified)  (Functional IR and ER ROM distance from C7 to tip of thumb in respective directions unless otherwise specified)    11/24/20  LEFT Cervical/Shoulder/Elbow  Strength (_/5) 11/24/20  RIGHT    Cervical Lateral Flexion     Shoulder Flexion     Shoulder Extension     Shoulder Abduction     Shoulder Internal Rotation     Shoulder External Rotation     Rhomboids     Middle Trapezius     Lower Trapezius     Hip Abduction     Hip Extension    (blank fields were intentionally left blank)  (All measurements assessed in sitting, with exception of middle and lower trapezius in prone, unless otherwise specified)    Palpation  11/24/20  ***    Deep Neck Flexor Endurance Supine  11/24/20  *** sec    Special Tests  Cervical:  11/24/20  Compression: ***  Distraction: ***  Sharp-Purser: ***  Alar Ligament Test: ***  Spurling's: ***    Shoulder:  11/24/20  Subacrominal Impingement:  Hawkins-Kennedy Test: ***  Painful Arc Sign (60 deg -  120 deg): ***  Empty Can Sign: ***  Drop Arm Sign (from full flexion): ***  Clinical Significance  [If 2+: +LR 5.03]  [If 3+: +LR 15.6]    Rotator Cuff Cluster:  Drop Arm Sign: ***  Painful Arc Sign (60 deg - 120 deg): ***  Infraspinatus MMT: ***  Clinical Significance  [If 2+: +LR 3.57]  [If 3+: +LR 15.6]    Neural Tension Assessment  11/24/20  LEFT  {NerveTension:52515}  RIGHT  {NerveTension:52515}                            Loula Marcella R [place], DPT

## 2020-11-28 ENCOUNTER — Telehealth: Payer: Self-pay | Admitting: Orthopedic Surgery

## 2020-11-28 ENCOUNTER — Inpatient Hospital Stay: Payer: 59 | Attending: Physician Assistant | Admitting: Rehabilitative and Restorative Service Providers"

## 2020-11-28 ENCOUNTER — Encounter: Payer: Self-pay | Admitting: Rehabilitative and Restorative Service Providers"

## 2020-11-28 ENCOUNTER — Inpatient Hospital Stay: Payer: 59 | Admitting: Rehabilitative and Restorative Service Providers"

## 2020-11-28 VITALS — BP 141/81 | HR 78

## 2020-11-28 DIAGNOSIS — M5459 Other low back pain: Secondary | ICD-10-CM | POA: Insufficient documentation

## 2020-11-28 NOTE — Telephone Encounter (Signed)
This is the first request I have gotten for the Robaxin, she has been discussing Hydrocodone, have now forwarded to Dr Aline Brochure for Robaxin.

## 2020-11-28 NOTE — Telephone Encounter (Signed)
Patient called and left voicemail on Saturday morning, 11/26/20 that [facility]  Can you help with this?

## 2020-11-28 NOTE — PT/OT Therapy Note (Signed)
Name: Helen [name] Age: 75 y.o.   Date of Service: 11/28/2020  Referring Physician: Hilarie Fredrickson, PA   Date of Injury: 07/04/2020  Date Care Plan Established/Reviewed: 11/28/2020  Date Treatment Started: 11/28/2020  End of Certification Date: 02/25/2021  Sessions in Plan of Care: 12  Surgery Date: No data was found  MD Follow-up: No data was found  Medbridge Code: No data was found    Visit Count: 1   Diagnosis:   1. Other low back pain             Precautions: No data was found  Allergies: Aspirin, Peppers, Pollen extract, and Latex        BP: 141/81 Heart Rate: 78     Initial Evaluation Reference and/or Current Measurements(as dated):  History of Present Illness   Mechanism of injury: Insidious onset 4 months ago  History of Present Illness: Pt reports that she hadn't done anything to cause her pain.  She reports that she remembers rolling over in bed approx 4 months ago and started having excruciating LBP.  She does not take pain medication, but uses Lidocaine patches for p(ain control.  Bending forward, sitting and standing increases her pain.  Walking alleviates her LBP.  Pt has hx of chronic knee pain.  Functional Limitations (PLOF): Sitting/standing >31min increases pain (unrestricted); unable to rotate to reaching for something in the back seat of the car (unrestricted)    Outcome Measure   Tool Used/Details: FOTO    Pain   Current pain rating: 6  At best pain rating: 6  At worst pain rating: 8  Location: L-spine      Precautions: No data was found  Allergies: Aspirin, Peppers, Pollen extract, and Latex         Past Medical History:   Diagnosis Date    Anemia     iron    currently on medication      Arthritis     general body      Arthritis     Generalized    Asthma     Asthma without status asthmaticus     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    Bilateral cataracts     Breast cancer     Breast lump     Cancer     breast cancer    Carcinoma      Carcinoma of the bladder    Chicken pox     Childhood illness    Conduction Disorder     RBBB    Diabetes mellitus type II     Non-Insulin dependent    Echocardiogram 10/2013    Gastroesophageal reflux disease     Hemorrhoids without complication     Holter monitor 02/2014    Hyperlipidemia     Hypertensive disorder     Malignant neoplasm of breast     Measles     Childhood illness    Mumps     Childhood illness    Myocardial perfusion scan 11/2013, 12/2013    MPI single Isotope excrise; MPI dual Isotope Lexiscan    RBBB     Syncope            BP: 141/81 Heart Rate: 78     OBJECTIVE    (11/28/20)  Observation: Healthy, well-nourished patient with no apparent distress during evaluation.   Body posture: Patient presents with rounded shoulder and slouched seated posture with forward head position. Patient educated  on the importance of and techniques to achieve good spinal posture.   Integumentary: No wound, rash, or lesion noted  Gait: Within functional limits   NEUROLOGIC EVALUATION   Sensation: L2-S2 dermatomes intact to light touch bilaterally   Motor: Refer to manual muscle grading   Reflexes: 2+ knee jerk (L2-L4), 2+ ankle jerk (S1-S2), negative clonus reflexes bilaterally   Edema/Effusion: none noted   Palpation: Tender to palpation (TTP) over bil lumbar paraspinals L1-L5, R=L     Lumbar ROM (deg) 11/28/20  AROM   Flexion Fingertips to floor   Extension 20deg   Repeated lumbar extension in STANDING Increase LBP, no worse   Repeated Lumbar Extension in PRONE Increase LBP, no worse   Repeated lumbar extension in lying with PT overpressure NT   Hips off-center R/L NT   Repeated extension in lying with hips off-center R/L NT   (blank fields were intentionally left blank)    Quads in prone: left 103deg; R 108deg    11/28/20  LEFT Lumbar/Hip  Strength (_/5) 11/28/20  RIGHT   4+ Hip Flexion 4+   3+ Hip Abduction 3+   3 Hip Extension 3   4+ Knee Flexion 4+   4 Knee Extension 4   4+  Ankle Dorsiflexion 4+   4(MMT) Ankle Plantarflexion 4(MMT)   (blank fields were intentionally left blank)  (Ankle plantarflexion measured as number of reps standing SL [25 reps being 5/5 MMT] unless otherwise specified)    SPECIAL TESTS  Lumbar quadrant: negative   Slump: negative   Lumbar P/A central: grade 2 hypomobility L1-L5   Lumbar P/A unilateral: grade 2 hypomobility L1-L5                 Exercise Flow Sheet    Precautions:  None; bil knee pain     MD Follow-up:  TBD      Exercise Specifics         PN / FOTO   Bike               Prone quad stretch               Prone hip ext               Clams               DWS                                                                                                         Home Exercise Program               (all exercises were completed with therapist supervision unless noted)    (White date box = 2 week treatment reminder)    Patient HEP:  Access Code: [email or web address]  URL: [email or web address]  Date: 11/28/2020  Prepared by: Lucina Mellow    Exercises  Standing Lumbar Extension - 5 x daily - 7 x weekly - 5 reps - 5sec hold  Prone Press Up - 2 x daily - 7 x weekly - 5 reps -  5sec hold  Static Prone on Elbows - 2 x daily - 7 x weekly - hold  Prone Hip Extension - 1 x daily - 7 x weekly - 3 sets - 10 reps         ---    Flowsheet Row ---   Total Time    Timed Minutes 5 minutes   Untimed Minutes 30 minutes   Total Time 35 minutes           Goals    Goal 1: Patient will demonstrate independence in prescribed HEP with proper form, sets and reps for safe discharge to an independent program.   Sessions: 12      Goal 2: Increase hip extension strength 4/5  to allow patient to demonstrate proper lifting technique of 10 lbs from floor for cleaning bath tub and floors.   Sessions: 12      Goal 3: Increase lumbar extension ROM to 30 degrees to allow patient to stand for >30 minutes without pain to perform meal preparation.   Sessions: 12                                    Lucina Mellow, DPT

## 2020-11-28 NOTE — Progress Notes (Signed)
Name:Helen [name] Age: 75 y.o.   Date of Service: 11/28/2020  Referring Physician: Hilarie Fredrickson, PA   Date of Injury: 07/04/2020  Date Care Plan Established/Reviewed: 11/28/2020  Date Treatment Started: 11/28/2020  End of Certification Date: 02/25/2021  Sessions in Plan of Care: 12  Surgery Date: No data was found  MD Follow-up: No data was found  Medbridge Code: No data was found    Visit Count: 1   Diagnosis:   1. Other low back pain        Subjective     History of Present Illness   Mechanism of injury: Insidious onset 4 months ago  History of Present Illness: Pt reports that she hadn't done anything to cause her pain.  She reports that she remembers rolling over in bed approx 4 months ago and started having excruciating LBP.  She does not take pain medication, but uses Lidocaine patches for p(ain control.  Bending forward, sitting and standing increases her pain.  Walking alleviates her LBP.  Pt has hx of chronic knee pain.  Functional Limitations (PLOF): Sitting/standing >87min increases pain (unrestricted); unable to rotate to reaching for something in the back seat of the car (unrestricted)    Outcome Measure   Tool Used/Details: FOTO    Pain   Current pain rating: 6  At best pain rating: 6  At worst pain rating: 8  Location: L-spine      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    Bilateral cataracts     Breast cancer     Breast lump     Cancer     breast cancer    Carcinoma     Carcinoma of the bladder    Chicken pox     Childhood illness    Conduction Disorder     RBBB    Diabetes mellitus type II     Non-Insulin dependent    Echocardiogram 10/2013    Gastroesophageal reflux disease      Hemorrhoids without complication     Holter monitor 02/2014    Hyperlipidemia     Hypertensive disorder     Malignant neoplasm of breast     Measles     Childhood illness    Mumps     Childhood illness    Myocardial perfusion scan 11/2013, 12/2013    MPI single Isotope excrise; MPI dual Isotope Lexiscan    RBBB     Syncope            BP: 141/81 Heart Rate: 78     OBJECTIVE    (11/28/20)  Observation: Healthy, well-nourished patient with no apparent distress during evaluation.   Body posture: Patient presents with rounded shoulder and slouched seated posture with forward head position. Patient educated on the importance of and techniques to achieve good spinal posture.   Integumentary: No wound, rash, or lesion noted  Gait: Within functional limits   NEUROLOGIC EVALUATION   Sensation: L2-S2 dermatomes intact to light touch bilaterally   Motor: Refer  to manual muscle grading   Reflexes: 2+ knee jerk (L2-L4), 2+ ankle jerk (S1-S2), negative clonus reflexes bilaterally   Edema/Effusion: none noted   Palpation: Tender to palpation (TTP) over bil lumbar paraspinals L1-L5, R=L     Lumbar ROM (deg) 11/28/20  AROM   Flexion Fingertips to floor   Extension 20deg   Repeated lumbar extension in STANDING Increase LBP, no worse   Repeated Lumbar Extension in PRONE Increase LBP, no worse   Repeated lumbar extension in lying with PT overpressure NT   Hips off-center R/L NT   Repeated extension in lying with hips off-center R/L NT   (blank fields were intentionally left blank)    Quads in prone: left 103deg; R 108deg    11/28/20  LEFT Lumbar/Hip  Strength (_/5) 11/28/20  RIGHT   4+ Hip Flexion 4+   3+ Hip Abduction 3+   3 Hip Extension 3   4+ Knee Flexion 4+   4 Knee Extension 4   4+ Ankle Dorsiflexion 4+   4(MMT) Ankle Plantarflexion 4(MMT)   (blank fields were intentionally left blank)  (Ankle plantarflexion measured as number of reps standing SL [25 reps being 5/5 MMT] unless otherwise specified)    SPECIAL  TESTS  Lumbar quadrant: negative   Slump: negative   Lumbar P/A central: grade 2 hypomobility L1-L5   Lumbar P/A unilateral: grade 2 hypomobility L1-L5     Treatment     Therapeutic Exercises - Justified to address any of the following: develop strength, endurance, ROM and/or flexibility.   Standing Lumbar ExtensionProne Press UpStatic Prone on Elbows  Prone Hip Extension     Home Exercises   Access Code: [email or web address]  URL: [email or web address]  Date: 11/28/2020  Prepared by: Lucina Mellow    Exercises  Standing Lumbar Extension - 5 x daily - 7 x weekly - 5 reps - 5sec hold  Prone Press Up - 2 x daily - 7 x weekly - 5 reps - 5sec hold  Static Prone on Elbows - 2 x daily - 7 x weekly - hold  Prone Hip Extension - 1 x daily - 7 x weekly - 3 sets - 10 reps         ---    Flowsheet Row ---   Total Time    Timed Minutes 5 minutes   Untimed Minutes 30 minutes   Total Time 35 minutes        Assessment   [name] is a 75 y.o. female presenting with LBP with intermittent RLE radiculopathy above the knee who requires Physical Therapy for the following:  Impairments: Pain that limits and interferes with functional ability  Decreased range of motion  Decreased strength  Decreased joint mobility  Decreased soft tissue mobility  Decreased/impaired motor control      Pain located: L-spine    Clinical presentation: stable   Barriers to therapy: Patient's age may result in requiring additional time for rehabilitation and strengthening.  Functional Limitations (PLOF): Sitting/standing >29min increases pain (unrestricted); unable to rotate to reaching for something in the back seat of the car (unrestricted)  Prognosis: good  Patient is aware of diagnosis, prognosis and consents to plan of care: Yes  Plan   Visits per week: 2  Number of Sessions: 12  Direct One on One  54098: Therapeutic Exercise: To Develop Strength and Endurance, ROM and Flexibility  [id number]: Neuromuscular Reeducation  97140: Manual Therapy techniques  (mobilization, manipulation, manual traction) (Grade I-IV posteroanterior glides to L1-L5 and STM to  paraspinals at respective levels as indicated.)  97530: Therapeutic Activities: Dynamic activities to improve functional performance  Supervised Modalities  97010: Thermal modalities: hot/cold packs    Goals    Goal 1: Patient will demonstrate independence in prescribed HEP with proper form, sets and reps for safe discharge to an independent program.   Sessions: 12      Goal 2: Increase hip extension strength 4/5  to allow patient to demonstrate proper lifting technique of 10 lbs from floor for cleaning bath tub and floors.   Sessions: 12      Goal 3: Increase lumbar extension ROM to 30 degrees to allow patient to stand for >30 minutes without pain to perform meal preparation.   Sessions: 12                                   Lucina Mellow, DPT

## 2020-11-29 ENCOUNTER — Telehealth: Payer: Self-pay | Admitting: Orthopedic Surgery

## 2020-11-29 DIAGNOSIS — Z96641 Presence of right artificial hip joint: Secondary | ICD-10-CM | POA: Diagnosis not present

## 2020-11-29 DIAGNOSIS — N1831 Chronic kidney disease, stage 3a: Secondary | ICD-10-CM | POA: Diagnosis not present

## 2020-11-29 DIAGNOSIS — F339 Major depressive disorder, recurrent, unspecified: Secondary | ICD-10-CM | POA: Diagnosis not present

## 2020-11-29 DIAGNOSIS — I129 Hypertensive chronic kidney disease with stage 1 through stage 4 chronic kidney disease, or unspecified chronic kidney disease: Secondary | ICD-10-CM | POA: Diagnosis not present

## 2020-11-29 DIAGNOSIS — E039 Hypothyroidism, unspecified: Secondary | ICD-10-CM | POA: Diagnosis not present

## 2020-11-29 DIAGNOSIS — S72001D Fracture of unspecified part of neck of right femur, subsequent encounter for closed fracture with routine healing: Secondary | ICD-10-CM | POA: Diagnosis not present

## 2020-11-29 DIAGNOSIS — Z9181 History of falling: Secondary | ICD-10-CM | POA: Diagnosis not present

## 2020-11-29 DIAGNOSIS — E7800 Pure hypercholesterolemia, unspecified: Secondary | ICD-10-CM | POA: Diagnosis not present

## 2020-11-29 DIAGNOSIS — K219 Gastro-esophageal reflux disease without esophagitis: Secondary | ICD-10-CM | POA: Diagnosis not present

## 2020-11-29 NOTE — Telephone Encounter (Signed)
Call received from Mickel Baas at [facility], ph [phone], for orders for physical therapy: 2 times per week for 2 weeks; then 1 time per week for 4 weeks.  States may leave a message on her secure voice mail.

## 2020-11-29 NOTE — Telephone Encounter (Signed)
Left message with verbal orders

## 2020-11-30 ENCOUNTER — Inpatient Hospital Stay: Payer: 59

## 2020-12-01 ENCOUNTER — Inpatient Hospital Stay: Payer: 59 | Attending: Family | Admitting: Physical Therapist

## 2020-12-01 DIAGNOSIS — M5459 Other low back pain: Secondary | ICD-10-CM

## 2020-12-01 NOTE — PT/OT Therapy Note (Signed)
Name: Helen [name] Age: 75 y.o.   Date of Service: 12/01/2020  Referring Physician: Myer Haff, FNP   Date of Injury: 07/04/2020  Date Care Plan Established/Reviewed: 11/28/2020  Date Treatment Started: 11/28/2020  End of Certification Date: 02/25/2021  Sessions in Plan of Care: 12  Surgery Date: No data was found  MD Follow-up: No data was found  Medbridge Code: No data was found    Visit Count: 2   Diagnosis:   1. Other low back pain        Subjective     Daily Subjective   Pt reports current 8/10 low back pain. Her back was hurting while sitting in the waiting room. Has done HEP since IE, no changes in back pain after.    Social Support/Occupation  Lives in: condominium  Lives with: adult children  Occupation: Retired      Precautions: No data was found  Allergies: Aspirin, Peppers, Pollen extract, and Latex                    Initial Evaluation Reference and/or Current Measurements(as dated):  History of Present Illness   Mechanism of injury: Insidious onset 4 months ago  History of Present Illness: Pt reports that she hadn't done anything to cause her pain.  She reports that she remembers rolling over in bed approx 4 months ago and started having excruciating LBP.  She does not take pain medication, but uses Lidocaine patches for p(ain control.  Bending forward, sitting and standing increases her pain.  Walking alleviates her LBP.  Pt has hx of chronic knee pain.  Functional Limitations (PLOF): Sitting/standing >83min increases pain (unrestricted); unable to rotate to reaching for something in the back seat of the car (unrestricted)    Outcome Measure   Tool Used/Details: FOTO    Pain   Current pain rating: 6  At best pain rating: 6  At worst pain rating: 8  Location: L-spine      Precautions: No data was found  Allergies: Aspirin, Peppers, Pollen extract, and Latex         Past Medical History:   Diagnosis Date    Anemia     iron    currently on medication      Arthritis     general body       Arthritis     Generalized    Asthma     Asthma without status asthmaticus     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    Bilateral cataracts     Breast cancer     Breast lump     Cancer     breast cancer    Carcinoma     Carcinoma of the bladder    Chicken pox     Childhood illness    Conduction Disorder     RBBB    Diabetes mellitus type II     Non-Insulin dependent    Echocardiogram 10/2013    Gastroesophageal reflux disease     Hemorrhoids without complication     Holter monitor 02/2014    Hyperlipidemia     Hypertensive disorder     Malignant neoplasm of breast     Measles     Childhood illness    Mumps     Childhood illness    Myocardial perfusion scan 11/2013, 12/2013    MPI single Isotope excrise; MPI dual Isotope Lexiscan    RBBB  Syncope            BP: 141/81 Heart Rate: 78       Initial Evaluation Reference and/or Current Measurements(as dated):    OBJECTIVE  (11/28/20)  Palpation: Tender to palpation (TTP) over bil lumbar paraspinals L1-L5, R=L     Lumbar ROM (deg) 11/28/20  AROM   Flexion Fingertips to floor   Extension 20deg   Repeated lumbar extension in STANDING Increase LBP, no worse   Repeated Lumbar Extension in PRONE Increase LBP, no worse   Repeated lumbar extension in lying with PT overpressure NT   Hips off-center R/L NT   Repeated extension in lying with hips off-center R/L NT   (blank fields were intentionally left blank)    Quads in prone: left 103deg; R 108deg    11/28/20  LEFT Lumbar/Hip  Strength (_/5) 11/28/20  RIGHT   4+ Hip Flexion 4+   3+ Hip Abduction 3+   3 Hip Extension 3   4+ Knee Flexion 4+   4 Knee Extension 4   4+ Ankle Dorsiflexion 4+   4(MMT) Ankle Plantarflexion 4(MMT)   (blank fields were intentionally left blank)  (Ankle plantarflexion measured as number of reps standing SL [25 reps being 5/5 MMT] unless otherwise specified)    SPECIAL TESTS  Lumbar quadrant: negative   Slump: negative    Lumbar P/A central: grade 2 hypomobility L1-L5   Lumbar P/A unilateral: grade 2 hypomobility L1-L5       Treatment     Therapeutic Exercises - Justified to address any of the following: develop strength, endurance, ROM and/or flexibility.   Dynamic warm-up on recumbent bike to improve blood flow prior to mobility and strengthening. Subjective gained during this time to guide today's treatment.     TE per flow:  + Prone press-ups on elbows for lumbar spine mobility  + Prone quad stretch  + Seated hamstring stretch  + DWS at 90 deg for pec flexibility     HEP updated and reviewed with pt for proper form and dosage       Neuromuscular Re-Education - Justified to address any of the following: of movement, balance, coordination, kinesthetic sense, posture and/or proprioception for sitting and/or standing activities.   + Prone hip extension, tcs and vcs to decrease pelvic rotation and to maintain knee extension for increased glute activation    + Clam shells, verbal cues to decrease pelvic rotation for increased glute med activation and TrA engagement                  ---    Flowsheet Row ---   Total Time    Timed Minutes 40 minutes   Total Time 40 minutes        Assessment   Pt presents with high irritability in low back. Added hamstring stretch due to ROM deficits demonstrated, modified to seated due to increased back pain in supine. Initiated core and proximal glute max/ med strengthening. Pt noted pain from turning side to side during interventions and would benefit from additional core strengthening and bridges to improve strength for bed mobility with decreased symptoms. Pt noted no change in symptoms at end of session.   Plan   Cont per POC  Assess tolerance to today's session  F/u on current HEP      Goals    Goal 1: Patient will demonstrate independence in prescribed HEP with proper form, sets and reps for safe discharge to an independent program.   Sessions:  12      Goal 2: Increase hip extension strength 4/5   to allow patient to demonstrate proper lifting technique of 10 lbs from floor for cleaning bath tub and floors.   Sessions: 12      Goal 3: Increase lumbar extension ROM to 30 degrees to allow patient to stand for >30 minutes without pain to perform meal preparation.   Sessions: 75                                   Rennie Natter, DPT      Exercise Flow Sheet    Precautions:  None; bil knee pain     MD Follow-up:  TBD      Exercise Specifics 12/01/20        PN / FOTO   Bike    recumbent bike 60 rpm 6'           Prone quad stretch    3x30" bilat.           Prone hip ext    3x10 bilat.           Clams    3x10 bilat.           DWS    3x30"           hamstring stretch    Supine modified to seated 3x30" bilat. seated          Prone press-ups  5x10"                [facility] Exercise Program    Reviewed / Updated            (all exercises were completed with therapist supervision unless noted)    (White date box = 2 week treatment reminder)    Patient HEP:  Access Code: [email or web address]  URL: [email or web address]  Date: 12/01/2020  Prepared by: Rennie Natter    Exercises   Standing Lumbar Extension - 5 x daily - 7 x weekly - 5 reps - 5sec hold   Prone Press Up - 2 x daily - 7 x weekly - 5 reps - 5sec hold   Static Prone on Elbows - 2 x daily - 7 x weekly - hold   Prone Hip Extension - 1 x daily - 7 x weekly - 3 sets - 10 reps   Clamshell - 1 x daily - 7 x weekly - 3 sets - 10 reps   Seated Hamstring Stretch - 1 x daily - 7 x weekly - 3 sets - 30 sec hold

## 2020-12-02 ENCOUNTER — Other Ambulatory Visit: Payer: Self-pay

## 2020-12-02 ENCOUNTER — Ambulatory Visit (INDEPENDENT_AMBULATORY_CARE_PROVIDER_SITE_OTHER): Payer: Medicare Other | Admitting: Family Medicine

## 2020-12-02 ENCOUNTER — Encounter: Payer: Self-pay | Admitting: Family Medicine

## 2020-12-02 VITALS — BP 128/74 | HR 84 | Temp 97.4°F | Ht 65.5 in | Wt 177.6 lb

## 2020-12-02 DIAGNOSIS — I10 Essential (primary) hypertension: Secondary | ICD-10-CM

## 2020-12-02 DIAGNOSIS — R55 Syncope and collapse: Secondary | ICD-10-CM | POA: Diagnosis not present

## 2020-12-02 DIAGNOSIS — E7800 Pure hypercholesterolemia, unspecified: Secondary | ICD-10-CM | POA: Diagnosis not present

## 2020-12-02 DIAGNOSIS — F339 Major depressive disorder, recurrent, unspecified: Secondary | ICD-10-CM | POA: Diagnosis not present

## 2020-12-02 DIAGNOSIS — I129 Hypertensive chronic kidney disease with stage 1 through stage 4 chronic kidney disease, or unspecified chronic kidney disease: Secondary | ICD-10-CM | POA: Diagnosis not present

## 2020-12-02 DIAGNOSIS — S72001D Fracture of unspecified part of neck of right femur, subsequent encounter for closed fracture with routine healing: Secondary | ICD-10-CM

## 2020-12-02 DIAGNOSIS — N1831 Chronic kidney disease, stage 3a: Secondary | ICD-10-CM | POA: Diagnosis not present

## 2020-12-02 DIAGNOSIS — E039 Hypothyroidism, unspecified: Secondary | ICD-10-CM | POA: Diagnosis not present
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
## 2020-12-03 LAB — CBC WITH DIFFERENTIAL/PLATELET
Absolute Monocytes: 592 cells/uL (ref 200–950)
Basophils Absolute: 61 cells/uL (ref 0–200)
Basophils Relative: 1 %
Eosinophils Absolute: 567 cells/uL — ABNORMAL HIGH (ref 15–500)
Eosinophils Relative: 9.3 %
HCT: 37.4 % (ref 35.0–45.0)
Hemoglobin: 12.3 g/dL (ref 11.7–15.5)
Lymphs Abs: 2129 cells/uL (ref 850–3900)
MCH: 31.8 pg (ref 27.0–33.0)
MCHC: 32.9 g/dL (ref 32.0–36.0)
MCV: 96.6 fL (ref 80.0–100.0)
MPV: 9.4 fL (ref 7.5–12.5)
Monocytes Relative: 9.7 %
Neutro Abs: 2751 cells/uL (ref 1500–7800)
Neutrophils Relative %: 45.1 %
Platelets: 329 10*3/uL (ref 140–400)
RBC: 3.87 10*6/uL (ref 3.80–5.10)
RDW: 12.5 % (ref 11.0–15.0)
Total Lymphocyte: 34.9 %
WBC: 6.1 10*3/uL (ref 3.8–10.8)

## 2020-12-03 LAB — COMPLETE METABOLIC PANEL WITH GFR
AG Ratio: 1.7 (calc) (ref 1.0–2.5)
ALT: 15 U/L (ref 6–29)
AST: 20 U/L (ref 10–35)
Albumin: 4.3 g/dL (ref 3.6–5.1)
Alkaline phosphatase (APISO): 68 U/L (ref 37–153)
BUN/Creatinine Ratio: 20 (calc) (ref 6–22)
BUN: 24 mg/dL (ref 7–25)
CO2: 27 mmol/L (ref 20–32)
Calcium: 10.7 mg/dL — ABNORMAL HIGH (ref 8.6–10.4)
Chloride: 99 mmol/L (ref 98–110)
Creat: 1.21 mg/dL — ABNORMAL HIGH (ref 0.60–0.93)
GFR, Est African American: 51 mL/min/{1.73_m2} — ABNORMAL LOW (ref >=60)
GFR, Est Non African American: 44 mL/min/{1.73_m2} — ABNORMAL LOW (ref >=60)
Globulin: 2.5 g/dL (calc) (ref 1.9–3.7)
Glucose, Bld: 91 mg/dL (ref 65–99)
Potassium: 4.5 mmol/L (ref 3.5–5.3)
Sodium: 140 mmol/L (ref 135–146)
Total Bilirubin: 0.5 mg/dL (ref 0.2–1.2)
Total Protein: 6.8 g/dL (ref 6.1–8.1)

## 2020-12-03 LAB — MAGNESIUM: Magnesium: 1.3 mg/dL — ABNORMAL LOW (ref 1.5–2.5)

## 2020-12-05 ENCOUNTER — Inpatient Hospital Stay: Payer: 59 | Admitting: Rehabilitative and Restorative Service Providers"

## 2020-12-05 ENCOUNTER — Telehealth: Payer: Self-pay | Admitting: Rehabilitative and Restorative Service Providers"

## 2020-12-05 NOTE — Progress Notes (Signed)
[facility]   [address] [place], [place] [address]           [name]    Date of Visit:  05/28/2019  Date of Birth: 1946-07-23  Age: 75 yrs.   Medical Record Number: [id number]  __  CURRENT DIAGNOSES     1. Pure hypercholesterolemia, unspecified,  E78.00  2. Essential (primary) hypertension, I10  3. RBBB unspec, I45.10  4. Palpitations, R00.2  5. Chest pain, unspecified, R07.9  6. Syncope and collapse, R55  7. Abnormal electrocardiogram [ECG] [EKG], R94.31  __   ALLERGIES    Aspirin, Intolerance-dizziness  Aspirin, Sweating  __   MEDICATIONS     1. Norvasc 10 mg tablet, 1 po qd  2. Protonix 40 mg tablet,delayed release, 1 po qd  3. Diovan HCT 160 mg-12.5 mg tablet, 1 po qd  4. Lipitor 40 mg tablet, 1 po qd   5. albuterol sulfate 2.5 mg/3 mL (0.083 %) solution for nebulization, use as directed  6. glipizide 5 mg tablet, 1 po qd  7. Tradjenta 5 mg tablet, 1 po qd  8. Starlix 120 mg tablet, 1 po tid  9. Nasal Spray Bottle  10. multivitamin  capsule, 1 po qd  11. ferrous sulfate 325 mg (65 mg iron) tablet, 1 po qd  12. mecobalamin (vitamin B12) 1,000 mcg disintegrating tablet,sublingual, 1 po qd  13. Flovent HFA 110 mcg/actuation aerosol inhaler, twice a day  14. metformin  ER 500 mg tablet,extended release 24hr, 1 po bid  __  CHIEF COMPLAINT/REASON FOR VISIT  Followup of Essential (primary) hypertension and Followup  of RBBB unspec  __  HISTORY OF PRESENT ILLNESS  Helen [name] Is seen today in office for yearly follow-up. She has had no episodes of syncope.  No shortness of breath PND orthopnea peripheral edema. Occasional asthma.She indicates that she has been started on metformin.  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, Conduction Disorder-RBBB;   Infectious Diseases: Usual childhood illnesses of mumps, measles and chickenpox; Surgical Procedures: Hysterectomy  w/ removal of tubes and ovaries; Trauma History: No previous history of significant trauma.; Cardiology  Procedures-Invasive: No previous interventional or invasive cardiology procedures.; Cardiology Procedures-Noninvasive : Echocardiogram February 2015, MPI Single Isotope Exercise March 2015, Holter Monitor July 2015, MPI Dual Isotope Lexiscan April 2016; Left Ventricular Ejection Fraction : LVEF of 65% documented via nuclear study on 12/14/2014; Peripheral Vascular Procedures: Carotid NIVA February 2015   ___  FAMILY HISTORY  Father -- No relevant family history    __  CARDIAC RISK FACTORS      Tobacco Abuse: positive; Family History of Heart Disease: negative;  Hyperlipidemia: positive; Hypertension: positive;   Diabetes Mellitus: positive, Non Insulin Dependent, controlled by medication; Prior History of Heart Disease : negative; Obesity: positive; Sedentary Life Style :negative; JYN:[id number]; Menopausal:positive   __  SOCIAL HISTORY    Alcohol Use : Does not use alcohol; Smoking: never smoked; Never smoker ([id number]); Diet : Regular diet; Exercise: Exercises regularly;   __  REVIEW OF SYSTEMS     General: today feels normal, no change in activity tolerance; Integumentary: Denies any change in hair  or nails, rashes, or skin lesions.; Eyes: wears eye glasses/contact lenses, cataracts; Ears, Nose,  Throat, Mouth: Denies any hearing loss, epistaxis, hoarseness or difficulty speaking.;Respiratory:  Denies dyspnea, snoring,  cough, wheezing or hemoptysis., asthma; Cardiovascular: Denies palpitations, chest pain, orthopnea, PND, peripheral edema, syncope  or claudication.; Abdominal : acid reflux;Musculoskeletal :history of generalized arthritis, left knee brace; Neurological : Denies any history of recurrent strokes, TIA, or seizure disorder.;  Psychiatric: Denies any history of depression, substance  abuse or change in cognitive functions.; Endocrine : non-insulin dependent diabetes mellitus; Hematologic/Immunologic: Denies any food allergies, seasonal allergies, bleeding disorders.  __   PHYSICAL EXAMINATION    Vital Signs:  Blood Pressure:  136/70 Sitting, Left arm, regular  cuff  136/70 Sitting, Right arm, regular cuff    Weight: 170.00 lbs.  Height:  62.00"  BMI: 31.09   Pulse: 94/min.        Constitutional: Cooperative, alert and oriented,well developed, well nourished, in no acute distress. Skin:  warm and dry to touch, no apparent skin lesions or masses noted Head: normocephalic, normal female hair pattern  Eyes: conjunctivae and lids unremarkable ENT: ears, nose and throat unremarkable  Neck: JVP normal, no carotid bruit, thyroid not enlarged Chest: clear to auscultation bilaterally  Cardiac: Regular rhythm, No systolic murmur Abdomen: abdomen unremarkable  Peripheral Pulses: pulses full and equal in all extremities Extremities/Back: no edema present  Neurological: No gross motor or sensory deficits noted, affect appropriate, oriented to time, person and place.   __    ECG:  ECG is unchanged: Sinus rhythm, bifascicular block.     IMPRESSIONS:  1. Hypertension, controlled.  2. Bifascicular block, unchanged and asymptomatic.   3. Non-insulin-dependent diabetes    RECOMMENDATIONS:  1. Continue current medications.   2. Follow up in one year.      Helen [name] A. Hyacinth Meeker, MD, [facility]    cc: Daryel Gerald MD  ____________________________  TODAYS ORDERS  12 Lead ECG Today  Return Visit 15 MIN 1 year

## 2020-12-05 NOTE — Progress Notes (Signed)
[facility] OFFICE  [address] [place], [place] [address]     Helen [name]    Date of Visit:  06/08/2016  Date of Birth: [birth year]  Age: 75 yrs.   Medical Record Number: [id number]  __  CURRENT DIAGNOSES     1. Pure hypercholesterolemia, unspecified,  E78.00  2. RBBB unspec, I45.10  3. Palpitations, R00.2  4. Syncope and collapse, R55  5. Pre-op cardiovascular examination, Z01.810  6. Abnormal electrocardiogram [ECG] [EKG], R94.31  7. Chest pain, unspecified, R07.9  8. Essential  (primary) hypertension, I10  __  ALLERGIES    Aspirin, Intolerance-dizziness  Aspirin, Sweating   __  MEDICATIONS     1. Norvasc 10 mg tablet, 1 po qd  2. Protonix 40 mg tablet,delayed release, 1 po qd  3. Diovan HCT 160 mg-12.5 mg tablet,  1 po qd  4. Lipitor 40 mg tablet, 1 po qd  5. albuterol sulfate 2.5 mg/3 mL (0.083 %) solution for nebulization, use as directed  6. glipizide 5 mg tablet, 1 po qd  7. Tradjenta 5 mg tablet, 1 po qd  8. Starlix 120 mg tablet, 1 po  tid  9. Nasal Spray Bottle  __  CHIEF COMPLAINT/REASON FOR VISIT  Followup of Abnormal electrocardiogram [ECG] [EKG], Followup of Chest pain,  unspecified and Followup of Essential (primary) hypertension  __  HISTORY OF PRESENT ILLNESS  Helen [name] is a 75 year old woman in the office  today for a follow-up. She followed through with her left knee surgery and it went well. She will have to get a knee replacement in the future. 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 She has been doing well from  a cardiac standpoint with no symptoms of chest pain, palpitations, shortness of breath, lightheadedness, dizziness, syncopal episodes, orthopnea, or paroxysmal nocturnal dyspnea. Her blood pressure in the office today is 132/66.     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, Conduction Disorder-RBBB;  Infectious Diseases: Usual childhood illnesses of mumps, measles and chickenpox; Surgical Procedures: Hysterectomy  w/ removal of tubes and ovaries; Trauma History: No previous history of significant trauma.; Cardiology  Procedures-Invasive: No previous interventional or invasive cardiology procedures.; Cardiology Procedures-Noninvasive : Echocardiogram February 2015, MPI Single Isotope Exercise March 2015, Holter Monitor July 2015, MPI Dual Isotope Lexiscan April 2016; Left Ventricular Ejection Fraction : LVEF of 65% documented via nuclear study on 12/14/2014; Peripheral Vascular Procedures: Carotid NIVA February 2015   ___  FAMILY HISTORY  Father -- No relevant family history    __  CARDIAC RISK FACTORS      Tobacco Abuse: positive; Family History of Heart Disease: negative;  Hyperlipidemia: positive; Hypertension: positive;   Diabetes Mellitus: positive, Non Insulin Dependent, controlled by medication; Prior History of Heart Disease : negative; Obesity: positive; Sedentary Life Style :negative; [id number]; Menopausal:positive   __  SOCIAL HISTORY    Alcohol Use : Does not use alcohol; Smoking: never smoked; Never smoker ([id number]); Diet : Regular diet; Exercise: Exercises regularly;   __  PHYSICAL EXAMINATION     Vital Signs:  Blood Pressure:  132/66 Sitting, Left arm, regular cuff  132/64 Sitting, Right arm, regular cuff     Weight: 169.00 lbs.  Height: 62"  BMI:  31   Pulse: 84/min.   Respirations: 14/min.  Constitutional: Cooperative, alert and oriented,well developed, well nourished, in no acute distress. Skin:  warm and dry to touch, no apparent skin lesions or masses noted Head: normocephalic, normal female hair pattern  Eyes: conjunctivae and lids unremarkable ENT: ears, nose and throat unremarkable  Neck: JVP normal, no carotid bruit, thyroid not enlarged Chest: clear to auscultation bilaterally   Cardiac: Regular rhythm, 1/6 systolic murmur Abdomen: abdomen unremarkable  Peripheral Pulses: pulses full and equal in all extremities Extremities/Back: no edema present  Neurological: No gross motor or sensory deficits noted, affect appropriate, oriented to time, person and place.   __    ECG:  RBBB, left axis deviation.     IMPRESSIONS:   1. Hypertension,  controlled.   2. Diabetes, followed by PCP.  3. Bifascicular block. Remote history of syncope no recurrence.  4. History of chest pressure with normal MPI no recurrence.    RECOMMENDATIONS:   1. Continue current medications.   2. Follow-up in one year.     Documented by Ronaldo Miyamoto, acting as a scribe for Dr. Rocco Serene, MD, [facility].    I, Dr. Rocco Serene, personally performed the services described in the documentation, as scribed by Ronaldo Miyamoto in my  presence, and it is both accurate and correct.    Rocco Serene, MD, [facility]    cc: Daryel Gerald MD    ____________________________  Christianne Dolin  Return Visit 15 MIN 1 year  12 Lead ECG Today  Diet mgmt edu, guidance and counseling TODAY

## 2020-12-05 NOTE — Progress Notes (Signed)
[facility] OFFICE  [address] [place], [place] [address]     Helen [name]    Date of Visit:  03/21/2015  Date of Birth: 12-Aug-1946  Age: 75 yrs.   Medical Record Number: [id number]  __  CURRENT DIAGNOSES     1. Pure hypercholesterolemia, E78.0   2. Essential (primary) hypertension, I10  3. Chest pain, unspecified, R07.9  4. Syncope and collapse, R55  5. Abnormal electrocardiogram [ECG] [EKG], R94.31  6. RBBB unspec, I45.10  7. Palpitations, R00.2  __   ALLERGIES    Aspirin, Intolerance-dizziness  Aspirin, Sweating  __   MEDICATIONS     1. Norvasc 10 mg tablet, 1 po qd  2. Protonix 40 mg tablet,delayed release, 1 po qd  3. Diovan HCT 160 mg-12.5 mg tablet, 1 po qd  4. Lipitor 40 mg tablet, 1 po qd   5. albuterol sulfate 2.5 mg/3 mL (0.083 %) solution for nebulization, use as directed  6. glipizide 5 mg tablet, 1 po qd  7. Tradjenta 5 mg tablet, 1 po qd  8. Starlix 120 mg tablet, 1 po tid  __   CHIEF COMPLAINT/REASON FOR VISIT  Followup of Pure hypercholesterolemia, Followup of RBBB unspec and Pre-Operative Clearance  __  HISTORY OF PRESENT  ILLNESS  Helen [name] is a 75 year old woman normally followed by Dr. Hyacinth Meeker. She has a history of diabetes, hypertension, hyperlipidemia. She was seen in April for chest pressure. She underwent a nuclear  perfusion study, which showed normal perfusion, normal LV function. She continues to walk despite her knee pain without any chest pain or shortness of breath. She has known right bundle branch block.  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, Conduction Disorder-RBBB; Infectious Diseases: Usual childhood illnesses of mumps, measles and chickenpox;  Surgical Procedures: Hysterectomy w/ removal of tubes and ovaries; Trauma History: No  previous history  of significant trauma.; Cardiology Procedures-Invasive: No previous interventional or invasive cardiology procedures.;  Cardiology Procedures-Noninvasive: Echocardiogram February 2015, MPI Single Isotope Exercise March 2015, Holter Monitor July 2015, MPI Dual Isotope Lexiscan April 2016;  Left Ventricular Ejection Fraction: LVEF of 65% documented via nuclear study on 12/14/2014; Peripheral Vascular Procedures : Carotid NIVA February 2015  ___  FAMILY HISTORY  Father --  No relevant family history    __  CARDIAC RISK FACTORS     Tobacco Abuse: positive;  Family History of Heart Disease: negative; Hyperlipidemia: positive;  Hypertension: positive;  Diabetes Mellitus: positive, Non Insulin Dependent, controlled by medication;  Prior History of Heart Disease: negative; Obesity: positive;  Sedentary Life Style:negative; [id number]:[id number]; Menopausal :positive  __  SOCIAL HISTORY    Alcohol  Use: Does not use alcohol; Smoking: never smoked; Never smoker ([id number]);  Diet: Regular diet; Exercise: Exercises regularly;   __   REVIEW OF SYSTEMS    General: today feels normal, no change in activity tolerance;  Integumentary: Denies any change in hair or nails, rashes, or skin lesions.; Eyes: wears eye glasses/contact  lenses, cataracts; Ears, Nose, Throat, Mouth: Denies any hearing loss, epistaxis, hoarseness or difficulty speaking.; Respiratory: dyspnea with exertion; Cardiovascular: chest discomfort "flutters" unrelated to physical  activity; Abdominal : acid reflux;Musculoskeletal :history of generalized arthritis, left knee brace; Neurological : Denies any history of recurrent strokes, TIA, or seizure disorder.;  Psychiatric: Denies any history of depression, substance abuse  or change in cognitive functions.; Endocrine : non-insulin dependent diabetes mellitus; Hematologic/Immunologic: Denies any food allergies, seasonal allergies, bleeding disorders.  __   PHYSICAL EXAMINATION    Vital Signs:  Blood  Pressure:  118/74 Sitting, Left arm, regular  cuff  116/70 Sitting, Right arm, regular cuff    Weight: 169.00 lbs.  Height:  62"  BMI: 31   Pulse: 91/min.        Constitutional: Cooperative, alert and oriented,well developed, well nourished, in no acute distress. Skin:  warm and dry to touch, no apparent skin lesions or masses noted Head: normocephalic, normal female hair pattern  Eyes: conjunctivae and lids unremarkable ENT: ears, nose and throat unremarkable  Neck: JVP normal, no carotid bruit, thyroid not enlarged Chest: clear to auscultation bilaterally  Cardiac: Regular rhythm, 1/6 systolic murmur Abdomen: abdomen unremarkable  Peripheral Pulses: pulses full and equal in all extremities Extremities/Back: no edema present  Neurological: No gross motor or sensory deficits noted, affect appropriate, oriented to time, person and place.   __  ECG:  ECG today is sinus rhythm, right bundle branch block.    IMPRESSIONS:   1. Pending knee surgery.  2. Normal perfusion study, April 2016.  3. Hypertension.  4. Diabetes.  5. Hyperlipidemia.    COMMENTS:   Ms. Bermingham appears well compensated from a cardiovascular standpoint. She had a normal perfusion  study in April 2014. I, therefore, consider her an acceptable risk to proceed with a moderate to high risk procedure under general anesthesia.    Would continue her antihypertensive medications perioperatively as tolerated hemodynamically.     We would be happy to participate in her care should the need arise.      Rogelio Seen, MD, [facility]     Tid: [id number]    cc: Bess Kinds MD  Daryel Gerald MD    ds

## 2020-12-05 NOTE — Progress Notes (Signed)
[facility] OFFICE  [address] [place], [place] [address]     [name]    Date of Visit:  04/28/2018  Date of Birth: 1946/07/21  Age: 75 yrs.   Medical Record Number: [id number]  __  CURRENT DIAGNOSES     1. Pure hypercholesterolemia, unspecified,  E78.00  2. Essential (primary) hypertension, I10  3. RBBB unspec, I45.10  4. Palpitations, R00.2  5. Chest pain, unspecified, R07.9  6. Syncope and collapse, R55  7. Abnormal electrocardiogram [ECG] [EKG], R94.31  8. Pre-op  cardiovascular examination, Z01.810  __  ALLERGIES    Aspirin, Intolerance-dizziness  Aspirin,  Sweating  __  MEDICATIONS     1. albuterol sulfate 2.5 mg/3 mL (0.083 %) solution for nebulization,  use as directed  2. Diovan HCT 160 mg-12.5 mg tablet, 1 po qd  3. ferrous sulfate 325 mg (65 mg iron) tablet, 1 po qd  4. Flovent HFA 110 mcg/actuation aerosol inhaler, twice a day  5. glipizide 5 mg tablet, 1 po qd  6. Lipitor 40  mg tablet, 1 po qd  7. mecobalamin (vitamin B12) 1,000 mcg disintegrating tablet,sublingual, 1 po qd  8. multivitamin capsule, 1 po qd  9. Nasal Spray Bottle  10. Norvasc 10 mg tablet, 1 po qd  11. Protonix 40 mg tablet,delayed release,  1 po qd  12. Starlix 120 mg tablet, 1 po tid  13. Tradjenta 5 mg tablet, 1 po qd  __  CHIEF COMPLAINT/REASON FOR VISIT  Followup of Chest  pain, unspecified, Followup of Essential (primary) hypertension, Followup of Palpitations and Followup of RBBB unspec  __  HISTORY OF PRESENT ILLNESS   Ms. Helen [name] is back for followup, doing well. No syncope or dizziness. Blood pressure is well controlled.   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, Conduction Disorder-RBBB;  Infectious Diseases: Usual childhood illnesses of mumps, measles and  chickenpox; Surgical Procedures: Hysterectomy  w/ removal of tubes and ovaries; Trauma History: No previous history of significant trauma.; Cardiology  Procedures-Invasive: No previous interventional or invasive cardiology procedures.; Cardiology Procedures-Noninvasive : Echocardiogram February 2015, MPI Single Isotope Exercise March 2015, Holter Monitor July 2015, MPI Dual Isotope Lexiscan April 2016; Left Ventricular Ejection Fraction : LVEF of 65% documented via nuclear study on 12/14/2014; Peripheral Vascular Procedures: Carotid NIVA February 2015   ___  FAMILY HISTORY  Father -- No relevant family  history    __  CARDIAC RISK FACTORS     Tobacco Abuse : positive; Family History of Heart Disease: negative; Hyperlipidemia : positive; Hypertension: positive;  Diabetes Mellitus : positive, Non Insulin Dependent, controlled by medication; Prior History of Heart Disease: negative;  Obesity: positive; Sedentary Conservation officer, historic buildings; Age :positive; Menopausal:positive  __  SOCIAL HISTORY     Alcohol Use: Does not use alcohol; Smoking: never smoked; Never smoker ([id number]);  Diet: Regular diet; Exercise: Exercises regularly;   __   REVIEW OF SYSTEMS    General: today feels normal,  no change in activity tolerance; Integumentary: Denies any change in hair or nails, rashes, or skin lesions.;  Eyes: wears eye glasses/contact lenses, cataracts; Ears, Nose, Throat, Mouth:  Denies any hearing loss, epistaxis, hoarseness or difficulty speaking.;Respiratory: dyspnea with exertion;  Cardiovascular: chest discomfort "flutters" unrelated to physical activity; Abdominal  : acid reflux;Musculoskeletal:history of generalized  arthritis, left knee brace;  Neurological : Denies any history of recurrent strokes, TIA, or seizure disorder.;  Psychiatric: Denies any history of depression, substance abuse or change in cognitive functions.; Endocrine : non-insulin dependent diabetes mellitus; Hematologic/Immunologic:  Denies any food  allergies, seasonal allergies, bleeding disorders.  __  PHYSICAL EXAMINATION     Vital Signs:  Blood Pressure:  128/66 Sitting, Right arm, large cuff  122/66 Sitting, Left arm,  large cuff    Weight: 166.40 lbs.  Height: 62.00"   BMI: 30.43   Pulse: 83/min.        Constitutional: Cooperative, alert and oriented,well developed, well nourished, in no acute distress. Skin:  warm and dry to touch, no apparent skin lesions or masses noted Head: normocephalic, normal female hair  pattern Eyes: conjunctivae and lids unremarkable ENT : ears, nose and throat unremarkable Neck: JVP normal,  no carotid bruit, thyroid not enlarged Chest: clear to auscultation bilaterally Cardiac : Regular rhythm, 1/6 systolic murmur Abdomen: abdomen unremarkable Peripheral Pulses : pulses full and equal in all extremities Extremities/Back: no edema present  Neurological: No gross motor or sensory deficits noted, affect appropriate, oriented to time, person and place.   __     Medications added today by the physician:      ECG:  ECG is unchanged: Sinus rhythm, bifascicular  block.    IMPRESSIONS:  1. Hypertension, controlled.  2. Bifascicular block, unchanged and asymptomatic.      RECOMMENDATIONS:  1. Continue current medications.   2. Follow up in one year.     Wilfredo Canterbury A. Hyacinth Meeker, MD, [facility], FSCAI    LAM/tutbh    cc: Daryel Gerald MD   ____________________________   TODAYS ORDERS  Diet mgmt edu, guidance and counseling TODAY  12 Lead ECG Today  Return Visit 15 MIN 1 year

## 2020-12-05 NOTE — Telephone Encounter (Signed)
Called and spoke with patient. Patients states she's not coming to this appointment because her daughter had an appointment at the same time. Reminded patient of no show/cancellation policy and fee associated with missing appointment. Reminded patient of her next scheduled appointment for 12/08/20.    Helen [name], PT, DPT              [place] [phone]

## 2020-12-05 NOTE — Progress Notes (Signed)
[facility] OFFICE  [address] [place], [place] [address]     [name]    Date of Visit:  04/25/2017  Date of Birth: 26-Sep-1945  Age: 75 yrs.   Medical Record Number: [id number]  __  CURRENT DIAGNOSES     1. Pure hypercholesterolemia, unspecified,  E78.00  2. Essential (primary) hypertension, I10  3. RBBB unspec, I45.10  4. Palpitations, R00.2  5. Chest pain, unspecified, R07.9  6. Syncope and collapse, R55  7. Abnormal electrocardiogram [ECG] [EKG], R94.31  8. Pre-op  cardiovascular examination, Z01.810  __  ALLERGIES    Aspirin, Intolerance-dizziness  Aspirin,  Sweating  __  MEDICATIONS     1. albuterol sulfate 2.5 mg/3 mL (0.083 %) solution for nebulization,  use as directed  2. Diovan HCT 160 mg-12.5 mg tablet, 1 po qd  3. glipizide 5 mg tablet, 1 po qd  4. Lipitor 40 mg tablet, 1 po qd  5. Nasal Spray Bottle  6. Norvasc 10 mg tablet, 1 po qd  7. Protonix 40 mg tablet,delayed release,  1 po qd  8. Starlix 120 mg tablet, 1 po tid  9. Tradjenta 5 mg tablet, 1 po qd  __  CHIEF COMPLAINT/REASON FOR VISIT  Followup of Essential  (primary) hypertension, Followup of Pre-op cardiovascular examination, Followup of Pure hypercholesterolemia, unspecified and Followup of RBBB unspec  __  HISTORY OF PRESENT ILLNESS   Ms. Szczygiel is a 75 year old woman who returns to the office today for follow-up. She is planning to have surgery to repair a left ring trigger finger next week. During pre-operative evaluation she recorded new abnormal findings on her electrocardiogram.  She does note some indigestion after she eats and unrelated to exercise. She also experience general fatigue. She denies chest pain, palpitations, shortness of breath, lightheadedness, dizziness, syncopal episodes, orthopnea, or paroxysmal nocturnal dyspnea.  Her blood pressure in the office today is 134/84.   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, Conduction Disorder-RBBB; Infectious Diseases: Usual childhood illnesses of mumps, measles and chickenpox;  Surgical Procedures: Hysterectomy w/ removal of tubes and ovaries; Trauma History: No previous history  of significant trauma.; Cardiology Procedures-Invasive: No previous interventional or invasive cardiology procedures.;  Cardiology Procedures-Noninvasive: Echocardiogram February 2015, MPI Single Isotope Exercise March 2015, Holter Monitor July 2015, MPI Dual Isotope Lexiscan April 2016;  Left Ventricular Ejection Fraction: LVEF of 65% documented via nuclear study on 12/14/2014; Peripheral Vascular Procedures : Carotid NIVA February 2015  ___  FAMILY HISTORY  Father --  No relevant family history    __  CARDIAC RISK FACTORS     Tobacco Abuse: positive;  Family History of Heart Disease: negative; Hyperlipidemia: positive;  Hypertension: positive;  Diabetes Mellitus: positive, Non Insulin Dependent, controlled by medication;  Prior History of Heart Disease: negative; Obesity: positive;  Sedentary Life Style:negative; GEX:[id number]; Menopausal :positive  __  SOCIAL HISTORY    Alcohol  Use: Does not use alcohol; Smoking: never smoked; Never smoker ([id number]);  Diet: Regular diet; Exercise: Exercises regularly;   __   PHYSICAL EXAMINATION    Vital Signs:  Blood Pressure:  134/76 Sitting, Left arm, regular  cuff  128/74 Sitting, Right arm, regular cuff    Weight: 168.00 lbs.  Height:  62.00"  BMI: 30   Pulse: 85/min.  Constitutional: Cooperative, alert and oriented,well developed, well nourished, in no acute distress. Skin:  warm and dry to touch, no apparent skin lesions or masses noted Head: normocephalic, normal female hair pattern  Eyes: conjunctivae and lids unremarkable ENT: ears, nose and throat unremarkable  Neck: JVP normal, no carotid bruit,  thyroid not enlarged Chest: clear to auscultation bilaterally  Cardiac: Regular rhythm, 1/6 systolic murmur Abdomen: abdomen unremarkable  Peripheral Pulses: pulses full and equal in all extremities Extremities/Back: no edema present  Neurological: No gross motor or sensory deficits noted, affect appropriate, oriented to time, person and place.   __    ECG: Sinus rhythm, RBBB, left anterior fascicular block, left atrial enlargement.  Compared with last year, no change.    IMPRESSIONS:  1. Bifascicular block chronic, unchanged, asymptomatic. Remote his of sync wo recurrence  2. HTN, pt indicated bp is well controlled at home.    RECOMMENDATION:  Low cardiac  risk for anticipated surgery.    Documented by Ronaldo Miyamoto, acting as a scribe for Dr. Rocco Serene, MD, [facility].    I, Dr. Rocco Serene, personally performed the services described in the documentation, as scribed by Ronaldo Miyamoto in  my presence, and it is both accurate and correct.    Rocco Serene, MD, [facility]    cc: Daryel Gerald MD

## 2020-12-05 NOTE — PT/OT Therapy Note (Deleted)
Name: Helen [name] Age: 75 y.o.   Date of Service: 12/05/2020  Referring Physician: Hilarie Fredrickson, PA   Date of Injury: No data was found  Date Care Plan Established/Reviewed: No data was found  Date Treatment Started: No data was found  End of Certification Date: No data was found  Sessions in Plan of Care: No data was found  Surgery Date: No data was found  MD Follow-up: No data was found  Medbridge Code: No data was found    Visit Count: Visit count could not be calculated. Make sure you are using a visit which is associated with an episode.   Diagnosis:   No diagnosis found.    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    Bilateral cataracts     Breast cancer     Breast lump     Cancer     breast cancer    Carcinoma     Carcinoma of the bladder    Chicken pox     Childhood illness    Conduction Disorder     RBBB    Diabetes mellitus type II     Non-Insulin dependent    Echocardiogram 10/2013    Gastroesophageal reflux disease     Hemorrhoids without complication     Holter monitor 02/2014    Hyperlipidemia     Hypertensive disorder     Malignant neoplasm of breast     Measles     Childhood illness    Mumps     Childhood illness    Myocardial perfusion scan 11/2013, 12/2013    MPI single Isotope excrise; MPI dual Isotope Lexiscan    RBBB     Syncope          Initial Evaluation Reference and/or Current Measurements(as dated):    OBJECTIVE  (11/28/20)  Palpation:  Tender to palpation (TTP) over bil lumbar paraspinals L1-L5, R=L     Lumbar ROM (deg) 11/28/20  AROM   Flexion Fingertips to floor   Extension 20deg   Repeated lumbar extension in STANDING Increase LBP, no worse   Repeated Lumbar Extension in PRONE Increase LBP, no worse   Repeated lumbar extension in lying with PT overpressure NT   Hips off-center R/L NT   Repeated extension in lying with hips off-center R/L NT   (blank fields were intentionally left blank)    Quads in prone: left 103deg; R 108deg    11/28/20  LEFT Lumbar/Hip  Strength (_/5) 11/28/20  RIGHT   4+ Hip Flexion 4+   3+ Hip Abduction 3+   3 Hip Extension 3   4+ Knee Flexion 4+   4 Knee Extension 4  4+ Ankle Dorsiflexion 4+   4(MMT) Ankle Plantarflexion 4(MMT)   (blank fields were intentionally left blank)  (Ankle plantarflexion measured as number of reps standing SL [25 reps being 5/5 MMT] unless otherwise specified)    SPECIAL TESTS  Lumbar quadrant: negative   Slump: negative   Lumbar P/A central: grade 2 hypomobility L1-L5   Lumbar P/A unilateral: grade 2 hypomobility L1-L5       Treatment     Therapeutic Exercises - Justified to address any of the following: develop strength, endurance, ROM and/or flexibility.   Dynamic warm-up on recumbent bike to improve blood flow prior to mobility and strengthening. Subjective gained during this time to guide today's treatment.     TE per flow:  + Prone press-ups on elbows for lumbar spine mobility  + Prone quad stretch  + Seated hamstring stretch  + DWS at 90 deg for pec flexibility     HEP updated and reviewed with pt for proper form and dosage       Neuromuscular Re-Education - Justified to address any of the following: of movement, balance, coordination, kinesthetic sense, posture and/or proprioception for sitting and/or standing activities.   + Prone hip extension, tcs and vcs to decrease pelvic rotation and to maintain knee extension for increased glute activation    + Clam shells, verbal cues to  decrease pelvic rotation for increased glute med activation and TrA engagement                    Assessment   Pt presents with high irritability in low back. Added hamstring stretch due to ROM deficits demonstrated, modified to seated due to increased back pain in supine. Initiated core and proximal glute max/ med strengthening. Pt noted pain from turning side to side during interventions and would benefit from additional core strengthening and bridges to improve strength for bed mobility with decreased symptoms. Pt noted no change in symptoms at end of session.   Plan   Cont per POC  Assess tolerance to today's session  F/u on current HEP          Sincerity Cedar R [place], DPT      Exercise Flow Sheet    Precautions:  None; bil knee pain     MD Follow-up:  TBD      Exercise Specifics 12/01/20        PN / FOTO   Bike    recumbent bike 60 rpm 6'           Prone quad stretch    3x30" bilat.           Prone hip ext    3x10 bilat.           Clams    3x10 bilat.           DWS    3x30"           hamstring stretch    Supine modified to seated 3x30" bilat. seated          Prone press-ups  5x10"                [facility] Exercise Program  Reviewed / Updated            (all exercises were completed with therapist supervision unless noted)    (White date box = 2 week treatment reminder)    Patient HEP:  Access Code: [email or web address]  URL: [email or web address]  Date: 12/01/2020  Prepared by: Rennie Natter    Exercises   Standing Lumbar Extension - 5 x daily - 7 x weekly - 5 reps - 5sec hold   Prone Press Up - 2 x daily - 7 x weekly - 5 reps - 5sec hold   Static Prone on Elbows - 2 x daily - 7 x weekly - hold   Prone Hip Extension - 1 x daily - 7 x weekly - 3 sets - 10 reps   Clamshell - 1 x daily - 7 x weekly - 3 sets - 10 reps   Seated Hamstring Stretch - 1 x daily - 7 x weekly - 3 sets - 30 sec hold

## 2020-12-05 NOTE — Progress Notes (Signed)
[facility]  [address] [place], [place] [address]     [name]    Date of Visit:  12/06/2014  Date of Birth: March 24, 1946  Age: 75 yrs.   Medical Record Number: [id number]  __  CURRENT DIAGNOSES     1. Pure hypercholesterolemia, E78.0   2. Essential (primary) hypertension, I10  3. Chest pain, unspecified, R07.9  4. Syncope and collapse, R55  5. Abnormal electrocardiogram [ECG] [EKG], R94.31  6. RBBB unspec, I45.10  7. Palpitations, R00.2  __   ALLERGIES    Aspirin, Intolerance-dizziness  Aspirin, Sweating  __   MEDICATIONS     1. Norvasc 10 mg tablet, 1 po qd  2. Protonix 40 mg tablet,delayed release, 1 po qd  3. Diovan HCT 160 mg-12.5 mg tablet, 1 po qd  4. Lipitor 40 mg tablet, 1 po qd   5. albuterol sulfate 2.5 mg/3 mL (0.083 %) solution for nebulization, use as directed  6. glipizide 5 mg tablet, 1 po qd  7. Tradjenta 5 mg tablet, 1 po qd  8. Starlix 120 mg tablet, 1 po tid  __   CHIEF COMPLAINT/REASON FOR VISIT  routine f/u; exertional chest pain  __  HISTORY OF PRESENT ILLNESS   Ms. Andreatta is a very pleasant 75 year old woman who comes to the office today for a routine followup visit. She is followed by Dr. Rocco Serene for a history of hypertension, hypercholesterolemia, right bundle branch block, and palpitations. At the  office today, she tells me that she has been experiencing chest discomfort and generalized weakness and fatigue over the past several weeks. She describes this discomfort not as a sharp pain, but as substernal "pressure," which she experiences when she  overexerts herself such as with increased housework. She tells me that this pressure ceases with rest. Cardiac risk factors include age, postmenopausal status, hypertension, hyperlipidemia, and diabetes. Blood pressure in the office is 120 to 136 over  76 to 80. Ms. Maulding denies any palpitations, shortness of breath, dyspnea on exertion, lightheadedness, or dizziness. Bilateral breath sounds are clear to  auscultation. There is no evidence of lower extremity edema.  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, Conduction Disorder-RBBB; Infectious Diseases: Usual childhood illnesses of mumps, measles and chickenpox;  Surgical Procedures: Hysterectomy w/ removal of tubes and ovaries; Trauma History: No previous history  of significant trauma.; Cardiology Procedures-Invasive: No previous interventional or invasive cardiology procedures.;  Cardiology Procedures-Noninvasive: Echocardiogram February 2015, MPI Single Isotope Exercise March 2015, Holter Monitor July 2015; Left Ventricular Ejection  Fraction: LVEF of 60% documented via echocardiogram on 10/11/2013; Peripheral Vascular Procedures: Carotid  NIVA February 2015  ___  FAMILY HISTORY   Father -- No relevant family history    __  CARDIAC RISK FACTORS      Tobacco Abuse: positive; Family History of Heart Disease: negative;  Hyperlipidemia: positive; Hypertension: positive;   Diabetes Mellitus: positive, Non Insulin Dependent, controlled by medication; Prior History of Heart Disease : negative; Obesity: positive; Sedentary Life Style :negative; WGN:FAOZHYQM; Menopausal:positive  __   SOCIAL HISTORY    Alcohol Use: Does not use alcohol;  Smoking: never smoked; Never smoker ([id number]); Diet: Regular diet;  Exercise: Exercises regularly;   __  PHYSICAL EXAMINATION     Vital Signs:  Blood Pressure:  120/80 Sitting, Left arm, regular cuff  136/76 Sitting, Right arm,  regular cuff    Weight: 174.00 lbs.  Height: 62"   BMI: 32   Pulse: 87/min.   Respirations:  16/min.       Constitutional: Cooperative, alert and oriented,well developed, well nourished,  in no acute distress. Skin: warm and dry to touch, no apparent skin lesions or masses noted Head :  normocephalic, normal female hair pattern Eyes: conjunctivae and lids unremarkable  ENT: ears, nose and throat unremarkable Neck:  JVP normal, no carotid bruit, thyroid not enlarged Chest: clear to auscultation bilaterally Cardiac : Regular rhythm, 1/6 systolic murmur Abdomen: abdomen unremarkable Peripheral Pulses : pulses full and equal in all extremities Extremities/Back: no edema present  Neurological: No gross motor or sensory deficits noted, affect appropriate, oriented to time, person and place.   __     Medications added today by the physician:      ECG:  A 12-lead ECG today shows sinus rhythm at  a rate of 87 with evidence of right bundle branch block and left atrial enlargement, but no ST or T-wave changes from prior ECGs.    IMPRESSIONS:   1. New complaint of chest "pressure" (during periods of overexertion, which ceases with rest).  2. Hypertension controlled on Diovan and Norvasc.  3. Cardiac palpitations quiescent. Holter monitor from April 01, 2014 showing sinus rhythm  ranging  from 52 to 120 beats per minute with an average daily heart rate of 70 beats per  minute. No episodes of atrial fibrillation and no significant pauses.  4. Syncopal episode in the setting of an upper respiratory infection in February of 2015 with  no  symptomatic recurrence.  5. Abnormal ECG with right bundle branch block. No advanced conduction abnormality detected.  6. Diabetes, poorly controlled with most recent hemoglobin A1c of 10.2%. Medication changes  made by Dr. Loretha Stapler,  Ms. Brickey's primary care provider.  7. Hyperlipidemia with good control on Lipitor 40 mg daily.  8. Patient has an allergy to aspirin.     RECOMMENDATIONS:  1. In light of Ms. Rhoads cardiac risk factors and new complaints of exertional chest discomfort and fatigue,   I have asked her to obtain a nuclear stress test.  2. She  will continue her current cardiac medications.  3. Followup pending results of stress test.    Quin Hoop,  AGACNP-BC    Tid: [id number]     cc: Daryel Gerald MD    TPA  ____________________________  TODAYS ORDERS   12 Lead ECG Today  Diet mgmt edu, guidance and counseling TODAY  Lexiscan Infusion First Available

## 2020-12-06 ENCOUNTER — Other Ambulatory Visit: Payer: Self-pay | Admitting: *Deleted

## 2020-12-06 DIAGNOSIS — S72001D Fracture of unspecified part of neck of right femur, subsequent encounter for closed fracture with routine healing: Secondary | ICD-10-CM | POA: Diagnosis not present

## 2020-12-06 DIAGNOSIS — F339 Major depressive disorder, recurrent, unspecified: Secondary | ICD-10-CM | POA: Diagnosis not present

## 2020-12-06 DIAGNOSIS — I129 Hypertensive chronic kidney disease with stage 1 through stage 4 chronic kidney disease, or unspecified chronic kidney disease: Secondary | ICD-10-CM | POA: Diagnosis not present

## 2020-12-06 DIAGNOSIS — E7800 Pure hypercholesterolemia, unspecified: Secondary | ICD-10-CM | POA: Diagnosis not present

## 2020-12-06 DIAGNOSIS — N1831 Chronic kidney disease, stage 3a: Secondary | ICD-10-CM | POA: Diagnosis not present

## 2020-12-06 DIAGNOSIS — E039 Hypothyroidism, unspecified: Secondary | ICD-10-CM | POA: Diagnosis not present

## 2020-12-06 MED ORDER — MAGNESIUM OXIDE 400 MG PO CAPS: 400.0000 mg | ORAL_CAPSULE | Freq: Every day | ORAL | 3 refills | Status: DC

## 2020-12-07 ENCOUNTER — Encounter: Payer: Self-pay | Admitting: Family Medicine

## 2020-12-08 ENCOUNTER — Inpatient Hospital Stay: Payer: 59 | Attending: Physician Assistant

## 2020-12-08 DIAGNOSIS — M5459 Other low back pain: Secondary | ICD-10-CM | POA: Insufficient documentation

## 2020-12-08 DIAGNOSIS — F339 Major depressive disorder, recurrent, unspecified: Secondary | ICD-10-CM | POA: Diagnosis not present

## 2020-12-08 DIAGNOSIS — S72001D Fracture of unspecified part of neck of right femur, subsequent encounter for closed fracture with routine healing: Secondary | ICD-10-CM | POA: Diagnosis not present

## 2020-12-08 DIAGNOSIS — I129 Hypertensive chronic kidney disease with stage 1 through stage 4 chronic kidney disease, or unspecified chronic kidney disease: Secondary | ICD-10-CM | POA: Diagnosis not present

## 2020-12-08 DIAGNOSIS — N1831 Chronic kidney disease, stage 3a: Secondary | ICD-10-CM | POA: Diagnosis not present

## 2020-12-08 DIAGNOSIS — E039 Hypothyroidism, unspecified: Secondary | ICD-10-CM | POA: Diagnosis not present

## 2020-12-08 DIAGNOSIS — E7800 Pure hypercholesterolemia, unspecified: Secondary | ICD-10-CM | POA: Diagnosis not present

## 2020-12-08 NOTE — PT/OT Therapy Note (Signed)
Name: Helen [name] Age: 75 y.o.   Date of Service: 12/08/2020  Referring Physician: Hilarie Fredrickson, PA   Date of Injury: 07/04/2020  Date Care Plan Established/Reviewed: 11/28/2020  Date Treatment Started: 11/28/2020  End of Certification Date: 02/25/2021  Sessions in Plan of Care: 12  Surgery Date: No data was found  MD Follow-up: No data was found  Medbridge Code: No data was found    Visit Count: 3   Diagnosis:   1. Other low back pain        Subjective     Daily Subjective   Pt reports that her back is always hurting, either because the arthritis or something else.  The weather also may be making it bad today.  Current pain about a 7/10, which is slightly better than previous treatment which was an 8/10.  Pt reports no functional changes since starting PT.      Pt reports the standing lumbar extension helps with her back pain.    Social Support/Occupation  Lives in: condominium  Lives with: adult children  Occupation: Retired      Precautions: No data was found  Allergies: Aspirin, Peppers, Pollen extract, and Latex                    Initial Evaluation Reference and/or Current Measurements(as dated):  History of Present Illness   Mechanism of injury: Insidious onset 4 months ago  History of Present Illness: Pt reports that she hadn't done anything to cause her pain.  She reports that she remembers rolling over in bed approx 4 months ago and started having excruciating LBP.  She does not take pain medication, but uses Lidocaine patches for p(ain control.  Bending forward, sitting and standing increases her pain.  Walking alleviates her LBP.  Pt has hx of chronic knee pain.  Functional Limitations (PLOF): Sitting/standing >76min increases pain (unrestricted); unable to rotate to reaching for something in the back seat of the car (unrestricted)    Outcome Measure   Tool Used/Details: FOTO    Pain   Current pain rating: 6  At best pain rating: 6  At worst pain rating: 8  Location: L-spine      Precautions: No  data was found  Allergies: Aspirin, Peppers, Pollen extract, and Latex         Past Medical History:   Diagnosis Date    Anemia     iron    currently on medication      Arthritis     general body      Arthritis     Generalized    Asthma     Asthma without status asthmaticus     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    Bilateral cataracts     Breast cancer     Breast lump     Cancer     breast cancer    Carcinoma     Carcinoma of the bladder    Chicken pox     Childhood illness    Conduction Disorder     RBBB    Diabetes mellitus type II     Non-Insulin dependent    Echocardiogram 10/2013    Gastroesophageal reflux disease     Hemorrhoids without complication     Holter monitor 02/2014    Hyperlipidemia     Hypertensive disorder     Malignant neoplasm of breast     Measles  Childhood illness    Mumps     Childhood illness    Myocardial perfusion scan 11/2013, 12/2013    MPI single Isotope excrise; MPI dual Isotope Lexiscan    RBBB     Syncope            BP: 141/81 Heart Rate: 78       Initial Evaluation Reference and/or Current Measurements(as dated):    OBJECTIVE  (11/28/20)  Palpation: Tender to palpation (TTP) over bil lumbar paraspinals L1-L5, R=L     Lumbar ROM (deg) 11/28/20  AROM   Flexion Fingertips to floor   Extension 20deg   Repeated lumbar extension in STANDING Increase LBP, no worse   Repeated Lumbar Extension in PRONE Increase LBP, no worse   Repeated lumbar extension in lying with PT overpressure NT   Hips off-center R/L NT   Repeated extension in lying with hips off-center R/L NT   (blank fields were intentionally left blank)    Quads in prone: left 103deg; R 108deg    11/28/20  LEFT Lumbar/Hip  Strength (_/5) 11/28/20  RIGHT   4+ Hip Flexion 4+   3+ Hip Abduction 3+   3 Hip Extension 3   4+ Knee Flexion 4+   4 Knee Extension 4   4+ Ankle Dorsiflexion 4+   4(MMT) Ankle Plantarflexion 4(MMT)   (blank fields were  intentionally left blank)  (Ankle plantarflexion measured as number of reps standing SL [25 reps being 5/5 MMT] unless otherwise specified)    SPECIAL TESTS  Lumbar quadrant: negative   Slump: negative   Lumbar P/A central: grade 2 hypomobility L1-L5   Lumbar P/A unilateral: grade 2 hypomobility L1-L5       Treatment     Therapeutic Exercises - Justified to address any of the following: develop strength, endurance, ROM and/or flexibility.   Dynamic warm-up on recumbent bike to improve blood flow prior to mobility and strengthening. Subjective gained during this time to guide today's treatment.     TE per flow:  Continued prone press-ups, prone quad stretch, Seated hamstring stretch, and DWS at 90 deg for flexibility       Neuromuscular Re-Education - Justified to address any of the following: of movement, balance, coordination, kinesthetic sense, posture and/or proprioception for sitting and/or standing activities.   Continued prone hip extension, tcs and vcs to decrease pelvic rotation and to maintain knee extension for increased glute activation    Continued clam shells, verbal cues and tactile cues to decrease pelvic rotation for increased glute med activation and TrA engagement     + Bridge for glute max with verbal cues for setup and performance.                  ---    Flowsheet Row ---   Total Time    Timed Minutes 45 minutes   Total Time 45 minutes        Assessment   Continued focus on strengthening glute med/max to reduce low back compensation.  Educated pt on role glute muscles play with respect to low back pain and goals of each exercise.  Pt continues to note pain while turning side to side during interventions and would benefit from additional core strengthening.  Added bridges to improve strength for bed mobility, noting no increase in symptoms while performing. Pt noted feeling better at the end of session.  Plan   Cont per POC  Assess tolerance to today's session  F/u on current HEP  Goals     Goal 1: Patient will demonstrate independence in prescribed HEP with proper form, sets and reps for safe discharge to an independent program.   Sessions: 12      Goal 2: Increase hip extension strength 4/5  to allow patient to demonstrate proper lifting technique of 10 lbs from floor for cleaning bath tub and floors.   Sessions: 12      Goal 3: Increase lumbar extension ROM to 30 degrees to allow patient to stand for >30 minutes without pain to perform meal preparation.   Sessions: 12                                   Ginnie Smart, [place]      Exercise Flow Sheet    Precautions:  None; bil knee pain     MD Follow-up:  TBD      Exercise Specifics 12/01/20 12/08/20       PN / Janyth Contes   Bike    recumbent bike 60 rpm 6' lvl 1  6'          Prone quad stretch    3x30" bilat. 2x30"  bilat          Prone hip ext    3x10 bilat. 3x10   bilat          Clams    3x10 bilat. 3x10  bilat          DWS    3x30" 3x30"          hamstring stretch    Supine modified to seated 3x30" bilat. Seated  2x30"  bilat          Prone press-ups  5x10" 2x30"               [facility] Exercise Program    Reviewed / Updated            (all exercises were completed with therapist supervision unless noted)    (White date box = 2 week treatment reminder)    Patient HEP:  Access Code: [email or web address]  URL: [email or web address]  Date: 12/01/2020  Prepared by: Rennie Natter    Exercises   Standing Lumbar Extension - 5 x daily - 7 x weekly - 5 reps - 5sec hold   Prone Press Up - 2 x daily - 7 x weekly - 5 reps - 5sec hold   Static Prone on Elbows - 2 x daily - 7 x weekly - hold   Prone Hip Extension - 1 x daily - 7 x weekly - 3 sets - 10 reps   Clamshell - 1 x daily - 7 x weekly - 3 sets - 10 reps   Seated Hamstring Stretch - 1 x daily - 7 x weekly - 3 sets - 30 sec hold

## 2020-12-12 ENCOUNTER — Other Ambulatory Visit (INDEPENDENT_AMBULATORY_CARE_PROVIDER_SITE_OTHER): Payer: Self-pay | Admitting: Family

## 2020-12-12 ENCOUNTER — Inpatient Hospital Stay: Payer: 59 | Attending: Physician Assistant

## 2020-12-12 DIAGNOSIS — Z889 Allergy status to unspecified drugs, medicaments and biological substances status: Secondary | ICD-10-CM

## 2020-12-12 DIAGNOSIS — I10 Essential (primary) hypertension: Secondary | ICD-10-CM

## 2020-12-12 DIAGNOSIS — E1165 Type 2 diabetes mellitus with hyperglycemia: Secondary | ICD-10-CM

## 2020-12-12 DIAGNOSIS — M5459 Other low back pain: Secondary | ICD-10-CM | POA: Insufficient documentation

## 2020-12-12 DIAGNOSIS — E119 Type 2 diabetes mellitus without complications: Secondary | ICD-10-CM

## 2020-12-12 MED ORDER — ONETOUCH ULTRASOFT LANCETS MISC
3 refills | Status: DC
Start: 2020-12-12 — End: 2023-01-15

## 2020-12-12 MED ORDER — GLIPIZIDE 10 MG PO TABS
10.0000 mg | ORAL_TABLET | Freq: Two times a day (BID) | ORAL | 1 refills | Status: DC
Start: 2020-12-12 — End: 2021-02-06

## 2020-12-12 MED ORDER — ONETOUCH ULTRA VI STRP
ORAL_STRIP | 3 refills | Status: DC
Start: 2020-12-12 — End: 2023-01-15

## 2020-12-12 MED ORDER — VALSARTAN-HYDROCHLOROTHIAZIDE 160-12.5 MG PO TABS
1.0000 | ORAL_TABLET | Freq: Every day | ORAL | 3 refills | Status: DC
Start: 2020-12-12 — End: 2021-02-16

## 2020-12-12 MED ORDER — EPIPEN 2-PAK 0.3 MG/0.3ML IJ SOAJ
0.3000 mg | Freq: Once | INTRAMUSCULAR | 0 refills | Status: AC | PRN
Start: 2020-12-12 — End: 2021-12-12

## 2020-12-12 NOTE — Telephone Encounter (Signed)
Pt called requesting refill on     1. Lancets (onetouch ultrasoft) lancets 3 ordered   Summary: Use to check blood sugars 2 times daily, Print  Start: 05/12/2020   Ord/Sold: 05/12/2020 (O)   Report   Adh:   Taking:   Long-term:   Pharmacy: [id number] - [place], [place] - [address]   Med Dose History   Change     Patient Sig: Use to check blood sugars 2 times daily     Ordered on: 05/12/2020     Authorized by: Myer Haff     Dispense: 200 each     Admin Instructions: Use to check blood sugars 2 times daily     Note to Pharmacy: Diagnosis Code E11.65  Member number [facility]     Confirm pharmacy on file [facility] [address]

## 2020-12-12 NOTE — PT/OT Therapy Note (Signed)
Name: Helen [name] Age: 75 y.o.   Date of Service: 12/12/2020  Referring Physician: Hilarie Fredrickson, PA   Date of Injury: 07/04/2020  Date Care Plan Established/Reviewed: 11/28/2020  Date Treatment Started: 11/28/2020  End of Certification Date: 02/25/2021  Sessions in Plan of Care: 12  Surgery Date: No data was found  MD Follow-up: No data was found  Medbridge Code: No data was found    Visit Count: 4   Diagnosis:   1. Other low back pain        Subjective     Daily Subjective   Pt reports that she's feeling better today because the sun is better.  She reports compliance with her home exercises.  Reports now that the weather is getting nicer, she is walking more, which helps her knees.  Reports LBP at 6/10 today.    Social Support/Occupation  Lives in: condominium  Lives with: adult children  Occupation: Retired      Precautions: No data was found  Allergies: Aspirin, Peppers, Pollen extract, and Latex                    Initial Evaluation Reference and/or Current Measurements(as dated):  History of Present Illness   Mechanism of injury: Insidious onset 4 months ago  History of Present Illness: Pt reports that she hadn't done anything to cause her pain.  She reports that she remembers rolling over in bed approx 4 months ago and started having excruciating LBP.  She does not take pain medication, but uses Lidocaine patches for p(ain control.  Bending forward, sitting and standing increases her pain.  Walking alleviates her LBP.  Pt has hx of chronic knee pain.  Functional Limitations (PLOF): Sitting/standing >29min increases pain (unrestricted); unable to rotate to reaching for something in the back seat of the car (unrestricted)    Outcome Measure   Tool Used/Details: FOTO    Pain   Current pain rating: 6  At best pain rating: 6  At worst pain rating: 8  Location: L-spine      Precautions: No data was found  Allergies: Aspirin, Peppers, Pollen extract, and Latex         Past Medical History:   Diagnosis Date     Anemia     iron    currently on medication      Arthritis     general body      Arthritis     Generalized    Asthma     Asthma without status asthmaticus     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    Bilateral cataracts     Breast cancer     Breast lump     Cancer     breast cancer    Carcinoma     Carcinoma of the bladder    Chicken pox     Childhood illness    Conduction Disorder     RBBB    Diabetes mellitus type II     Non-Insulin dependent    Echocardiogram 10/2013    Gastroesophageal reflux disease     Hemorrhoids without complication     Holter monitor 02/2014    Hyperlipidemia     Hypertensive disorder     Malignant neoplasm of breast     Measles     Childhood illness    Mumps     Childhood illness    Myocardial perfusion scan 11/2013, 12/2013  MPI single Isotope excrise; MPI dual Isotope Lexiscan    RBBB     Syncope            BP: 141/81 Heart Rate: 78       Initial Evaluation Reference and/or Current Measurements(as dated):    OBJECTIVE  (11/28/20)  Palpation: Tender to palpation (TTP) over bil lumbar paraspinals L1-L5, R=L     Lumbar ROM (deg) 11/28/20  AROM   Flexion Fingertips to floor   Extension 20deg   Repeated lumbar extension in STANDING Increase LBP, no worse   Repeated Lumbar Extension in PRONE Increase LBP, no worse   Repeated lumbar extension in lying with PT overpressure NT   Hips off-center R/L NT   Repeated extension in lying with hips off-center R/L NT   (blank fields were intentionally left blank)    Quads in prone: left 103deg; R 108deg    11/28/20  LEFT 12/12/20  LEFT Lumbar/Hip  Strength (_/5) 11/28/20  RIGHT 12/12/20  RIGHT   4+  Hip Flexion 4+    3+ 4+ Hip Abduction 3+ 4   3 4  Hip Extension 3 4   4+  Knee Flexion 4+    4  Knee Extension 4    4+  Ankle Dorsiflexion 4+    4(MMT)  Ankle Plantarflexion 4(MMT)    (blank fields were intentionally left blank)  (Ankle plantarflexion measured as number of  reps standing SL [25 reps being 5/5 MMT] unless otherwise specified)    SPECIAL TESTS  Lumbar quadrant: negative   Slump: negative   Lumbar P/A central: grade 2 hypomobility L1-L5   Lumbar P/A unilateral: grade 2 hypomobility L1-L5       Treatment     Therapeutic Exercises - Justified to address any of the following: develop strength, endurance, ROM and/or flexibility.   Dynamic warm-up on recumbent bike to improve blood flow prior to mobility and strengthening. Subjective gained during this time to guide today's treatment.     Objective measures taken for goal progression and treatment planning.    TE per flow:  Continued prone press-ups, prone quad stretch, and hamstring stretch.       Updated HEP      Neuromuscular Re-Education - Justified to address any of the following: of movement, balance, coordination, kinesthetic sense, posture and/or proprioception for sitting and/or standing activities.   Modified prone hip extension to quadruped for increased TrA activation, tcs and vcs to maintain level pelvis and activate core.      Continued clam shells, verbal cues and tactile cues to decrease pelvic rotation for increased glute med activation and TrA engagement.      Continued Bridge for glute max with verbal cues for setup and performance.     Added standing hip abduction with vcs to keep toes pointed forward to target glute med ms.                  ---    Flowsheet Row ---   Total Time    Timed Minutes 43 minutes   Total Time 43 minutes        Assessment   Continued focus on strengthening glute med/max to reduce low back compensation.  Pt continues to not LBP when bed mobility.  Instructed pt to activate core when rolling from side to side.  Introduced quadruped hip extension for increased core activation, but pt c/o of knee p! 2/2 arthritis following exercise.  Pt able to tolerate all other exercises w/o p!.  Plan   Cont per POC  Assess tolerance to today's session  F/u on current HEP      Goals    Goal 1:  Patient will demonstrate independence in prescribed HEP with proper form, sets and reps for safe discharge to an independent program.   Sessions: 12      Goal 2: Increase hip extension strength 4/5  to allow patient to demonstrate proper lifting technique of 10 lbs from floor for cleaning bath tub and floors.    4/11 - Objective measures met, but NMR still needed to engage glute ms with functional activities. Pt has not attempted lifting anything from the floor yet.  - TC   Sessions: 12      Goal 3: Increase lumbar extension ROM to 30 degrees to allow patient to stand for >30 minutes without pain to perform meal preparation.   Sessions: 12                                   Ginnie Smart, [place]      Exercise Flow Sheet    Precautions:  None; bil knee pain     MD Follow-up:  TBD      Exercise Specifics 12/01/20 12/08/20 12/12/20      PN / Janyth Contes   Bike    recumbent bike 60 rpm 6' lvl 1  6' 60 rpms  6'         Prone quad stretch    3x30" bilat. 2x30"  bilat 2x45"  bilat         Prone hip ext    3x10 bilat. 3x10   bilat Quadruped  Hip ext  2x10 bilat         Clams    3x10 bilat. 3x10  bilat 3x10         DWS    3x30" 3x30" --         hamstring stretch    Supine modified to seated 3x30" bilat. Seated  2x30"  bilat Supine with strap  2x45"  bilat         Prone press-ups  5x10" 2x30" 2x30"              Bridges  2x10 2x10               Standing hip abd  2x10 bilat                                       Home Exercise Program    Reviewed / Updated            (all exercises were completed with therapist supervision unless noted)    (White date box = 2 week treatment reminder)    Patient HEP:  Access Code: [email or web address]  URL: [email or web address]  Date: 12/12/2020  Prepared by: Paulita Cradle    Exercises  Standing Lumbar Extension - 5 x daily - 7 x weekly - 5 reps - 5sec hold  Prone Press Up - 2 x daily - 7 x weekly - 5 reps - 5sec hold  Static Prone on Elbows - 2 x daily - 7 x weekly - hold  Prone Hip Extension - 1  x daily - 7 x weekly - 3 sets - 10 reps  Clamshell - 1 x daily - 7 x weekly -  3 sets - 10 reps  Seated Hamstring Stretch - 1 x daily - 7 x weekly - 3 sets - 30 sec hold  Supine Bridge - 1 x daily - 7 x weekly - 3 sets - 10 reps  Standing Hip Abduction with Counter Support - 1 x daily - 7 x weekly - 3 sets - 10 reps

## 2020-12-13 DIAGNOSIS — S72001D Fracture of unspecified part of neck of right femur, subsequent encounter for closed fracture with routine healing: Secondary | ICD-10-CM | POA: Diagnosis not present

## 2020-12-13 DIAGNOSIS — I129 Hypertensive chronic kidney disease with stage 1 through stage 4 chronic kidney disease, or unspecified chronic kidney disease: Secondary | ICD-10-CM | POA: Diagnosis not present

## 2020-12-13 DIAGNOSIS — E039 Hypothyroidism, unspecified: Secondary | ICD-10-CM | POA: Diagnosis not present

## 2020-12-13 DIAGNOSIS — N1831 Chronic kidney disease, stage 3a: Secondary | ICD-10-CM | POA: Diagnosis not present

## 2020-12-13 DIAGNOSIS — E7800 Pure hypercholesterolemia, unspecified: Secondary | ICD-10-CM | POA: Diagnosis not present

## 2020-12-13 DIAGNOSIS — F339 Major depressive disorder, recurrent, unspecified: Secondary | ICD-10-CM | POA: Diagnosis not present

## 2020-12-15 ENCOUNTER — Inpatient Hospital Stay: Payer: 59

## 2020-12-15 DIAGNOSIS — E039 Hypothyroidism, unspecified: Secondary | ICD-10-CM | POA: Diagnosis not present

## 2020-12-15 DIAGNOSIS — N1831 Chronic kidney disease, stage 3a: Secondary | ICD-10-CM | POA: Diagnosis not present

## 2020-12-15 DIAGNOSIS — E7800 Pure hypercholesterolemia, unspecified: Secondary | ICD-10-CM | POA: Diagnosis not present

## 2020-12-15 DIAGNOSIS — S72001D Fracture of unspecified part of neck of right femur, subsequent encounter for closed fracture with routine healing: Secondary | ICD-10-CM | POA: Diagnosis not present

## 2020-12-15 DIAGNOSIS — F339 Major depressive disorder, recurrent, unspecified: Secondary | ICD-10-CM | POA: Diagnosis not present

## 2020-12-15 DIAGNOSIS — I129 Hypertensive chronic kidney disease with stage 1 through stage 4 chronic kidney disease, or unspecified chronic kidney disease: Secondary | ICD-10-CM | POA: Diagnosis not present

## 2020-12-19 ENCOUNTER — Inpatient Hospital Stay: Payer: 59 | Attending: Physician Assistant | Admitting: Rehabilitative and Restorative Service Providers"

## 2020-12-19 DIAGNOSIS — M5459 Other low back pain: Secondary | ICD-10-CM | POA: Insufficient documentation

## 2020-12-19 NOTE — PT/OT Therapy Note (Signed)
Name: Helen [name] Age: 75 y.o.   Date of Service: 12/19/2020  Referring Physician: Hilarie Fredrickson, PA   Date of Injury: 07/04/2020  Date Care Plan Established/Reviewed: 11/28/2020  Date Treatment Started: 11/28/2020  End of Certification Date: 02/25/2021  Sessions in Plan of Care: 12  Surgery Date: No data was found  MD Follow-up: No data was found  Medbridge Code: No data was found    Visit Count: 5   Diagnosis:   1. Other low back pain        Subjective     Daily Subjective   Pt reports everything is hurting today with the weather. The back is the same when she's bending down and twisting. She'll use heat and patches for relief of the back. She'll also get relief with extended back. She can't do the exercise on her knees at home because it bothers the knees too much.  Pt doing the exercises in PT relieve symptoms during session but the next day things are right back to where they were.     Social Support/Occupation  Lives in: condominium  Lives with: adult children  Occupation: Retired      Precautions: No data was found  Allergies: Aspirin, Peppers, Pollen extract, and Latex                    Initial Evaluation Reference and/or Current Measurements(as dated):  History of Present Illness   Mechanism of injury: Insidious onset 4 months ago  History of Present Illness: Pt reports that she hadn't done anything to cause her pain.  She reports that she remembers rolling over in bed approx 4 months ago and started having excruciating LBP.  She does not take pain medication, but uses Lidocaine patches for p(ain control.  Bending forward, sitting and standing increases her pain.  Walking alleviates her LBP.  Pt has hx of chronic knee pain.  Functional Limitations (PLOF): Sitting/standing >17min increases pain (unrestricted); unable to rotate to reaching for something in the back seat of the car (unrestricted)    Outcome Measure   Tool Used/Details: FOTO    Pain   Current pain rating: 6  At best pain rating: 6  At  worst pain rating: 8  Location: L-spine      Precautions: No data was found  Allergies: Aspirin, Peppers, Pollen extract, and Latex         Past Medical History:   Diagnosis Date    Anemia     iron    currently on medication      Arthritis     general body      Arthritis     Generalized    Asthma     Asthma without status asthmaticus     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    Bilateral cataracts     Breast cancer     Breast lump     Cancer     breast cancer    Carcinoma     Carcinoma of the bladder    Chicken pox     Childhood illness    Conduction Disorder     RBBB    Diabetes mellitus type II     Non-Insulin dependent    Echocardiogram 10/2013    Gastroesophageal reflux disease     Hemorrhoids without complication     Holter monitor 02/2014    Hyperlipidemia     Hypertensive disorder     Malignant  neoplasm of breast     Measles     Childhood illness    Mumps     Childhood illness    Myocardial perfusion scan 11/2013, 12/2013    MPI single Isotope excrise; MPI dual Isotope Lexiscan    RBBB     Syncope            BP: 141/81 Heart Rate: 78       Initial Evaluation Reference and/or Current Measurements(as dated):    OBJECTIVE  (11/28/20)  Palpation: Tender to palpation (TTP) over bil lumbar paraspinals L1-L5, R=L     Lumbar ROM (deg) 11/28/20  AROM   Flexion Fingertips to floor   Extension 20deg   Repeated lumbar extension in STANDING Increase LBP, no worse   Repeated Lumbar Extension in PRONE Increase LBP, no worse   Repeated lumbar extension in lying with PT overpressure NT   Hips off-center R/L NT   Repeated extension in lying with hips off-center R/L NT   (blank fields were intentionally left blank)    Quads in prone: left 103deg; R 108deg    11/28/20  LEFT 12/12/20  LEFT Lumbar/Hip  Strength (_/5) 11/28/20  RIGHT 12/12/20  RIGHT   4+  Hip Flexion 4+    3+ 4+ Hip Abduction 3+ 4   3 4  Hip Extension 3 4   4+  Knee Flexion 4+     4  Knee Extension 4    4+  Ankle Dorsiflexion 4+    4(MMT)  Ankle Plantarflexion 4(MMT)    (blank fields were intentionally left blank)  (Ankle plantarflexion measured as number of reps standing SL [25 reps being 5/5 MMT] unless otherwise specified)    SPECIAL TESTS  Lumbar quadrant: negative   Slump: negative   Lumbar P/A central: grade 2 hypomobility L1-L5   Lumbar P/A unilateral: grade 2 hypomobility L1-L5       Treatment     Therapeutic Exercises - Justified to address any of the following: develop strength, endurance, ROM and/or flexibility.   Dynamic warm-up on recumbent bike to improve blood flow prior to mobility and strengthening. Subjective gained during this time to guide today's treatment.     TE per flow:  - Continued prone press-ups and prone quad stretch  + glut stretch with knee to opposite shoulder     - added resistance with standing hip abduction    Neuromuscular Re-Education - Justified to address any of the following: of movement, balance, coordination, kinesthetic sense, posture and/or proprioception for sitting and/or standing activities.   Progress bridge to finger-4 with verbal cues for glut squeeze for hip lift off table    LTR with physioball to increase core activation with control of ball in each direction, verbal cues to increase range as pt feels comfort    Manual Therapy - Justified to address any of the following:  Mobilization of joints and soft tissues, manipulation, manual lymphatic drainage, and/or manual traction.    Prone:  STM of R glut med                 ---    Flowsheet Row ---   Total Time    Timed Minutes 41 minutes   Total Time 41 minutes        Assessment   Secondary to pt subjective report of no long lasting changes with PT thus far, attempted manual treatment of glut med as pt noted reproduction of pain with palpation. Pt noted decreased in symptoms with ambulation  after manual treatment and glut stretching; she also exhibits more upright walking posturing sooner  in the gait versus fwd flexed posturing initially. No adverse effects with adding resistance with standing hip abduction.  Pt would benefit from skilled PT to address mobility, posture and proximal strength to increase activity tolerance with standing tasks and transitions.  Plan   Cont per POC  Progress with weight bearing exercises (consider adding side stepping)  Continue to address mobility       Goals    Goal 1: Patient will demonstrate independence in prescribed HEP with proper form, sets and reps for safe discharge to an independent program.   Sessions: 12      Goal 2: Increase hip extension strength 4/5  to allow patient to demonstrate proper lifting technique of 10 lbs from floor for cleaning bath tub and floors.    4/11 - Objective measures met, but NMR still needed to engage glute ms with functional activities. Pt has not attempted lifting anything from the floor yet.  - TC   Sessions: 12      Goal 3: Increase lumbar extension ROM to 30 degrees to allow patient to stand for >30 minutes without pain to perform meal preparation.   Sessions: 55                                   Marcelene Butte [place], DPT      Exercise Flow Sheet    Precautions:  None; bil knee pain     MD Follow-up:  TBD      Exercise Specifics 12/01/20 12/08/20 12/12/20 12/19/20     PN / Janyth Contes   Bike    recumbent bike 60 rpm 6' lvl 1  6' 60 rpms  6' lvl 1 6'    Prone press up x10        Prone quad stretch    3x30" bilat. 2x30"  bilat 2x45"  bilat 2x45"  bilat    Glut stretch 3x30" bil        Prone hip ext    3x10 bilat. 3x10   bilat Quadruped  Hip ext  2x10 bilat --        Clams    3x10 bilat. 3x10  bilat 3x10 --        DWS    3x30" 3x30" -- --        hamstring stretch    Supine modified to seated 3x30" bilat. Seated  2x30"  bilat Supine with strap  2x45"  bilat -->        Prone press-ups  5x10" 2x30" 2x30" 10x5"             Bridges  2x10 2x10 figure 4 bridge 2x10 bil               Standing hip abd  2x10 bilat Standing hip abd  2x10 bilat RTB                                       Home Exercise Program    Reviewed / Updated    --        (all exercises were completed with therapist supervision unless noted)    (White date box = 2 week treatment reminder)    Patient HEP:  Access Code: [email or web address]  URL: [email or web address]  Date:  12/12/2020  Prepared by: Paulita Cradle    Exercises  Standing Lumbar Extension - 5 x daily - 7 x weekly - 5 reps - 5sec hold  Prone Press Up - 2 x daily - 7 x weekly - 5 reps - 5sec hold  Static Prone on Elbows - 2 x daily - 7 x weekly - hold  Prone Hip Extension - 1 x daily - 7 x weekly - 3 sets - 10 reps  Clamshell - 1 x daily - 7 x weekly - 3 sets - 10 reps  Seated Hamstring Stretch - 1 x daily - 7 x weekly - 3 sets - 30 sec hold  Supine Bridge - 1 x daily - 7 x weekly - 3 sets - 10 reps  Standing Hip Abduction with Counter Support - 1 x daily - 7 x weekly - 3 sets - 10 reps

## 2020-12-20 DIAGNOSIS — E039 Hypothyroidism, unspecified: Secondary | ICD-10-CM | POA: Diagnosis not present

## 2020-12-20 DIAGNOSIS — F339 Major depressive disorder, recurrent, unspecified: Secondary | ICD-10-CM | POA: Diagnosis not present

## 2020-12-20 DIAGNOSIS — N1831 Chronic kidney disease, stage 3a: Secondary | ICD-10-CM | POA: Diagnosis not present

## 2020-12-20 DIAGNOSIS — S72001D Fracture of unspecified part of neck of right femur, subsequent encounter for closed fracture with routine healing: Secondary | ICD-10-CM | POA: Diagnosis not present

## 2020-12-20 DIAGNOSIS — E7800 Pure hypercholesterolemia, unspecified: Secondary | ICD-10-CM | POA: Diagnosis not present

## 2020-12-20 DIAGNOSIS — I129 Hypertensive chronic kidney disease with stage 1 through stage 4 chronic kidney disease, or unspecified chronic kidney disease: Secondary | ICD-10-CM | POA: Diagnosis not present

## 2020-12-21 ENCOUNTER — Other Ambulatory Visit: Payer: Self-pay

## 2020-12-21 ENCOUNTER — Ambulatory Visit (INDEPENDENT_AMBULATORY_CARE_PROVIDER_SITE_OTHER): Payer: Medicare Other | Admitting: Orthopedic Surgery

## 2020-12-21 ENCOUNTER — Encounter: Payer: Self-pay | Admitting: Orthopedic Surgery

## 2020-12-21 DIAGNOSIS — S72001D Fracture of unspecified part of neck of right femur, subsequent encounter for closed fracture with routine healing: Secondary | ICD-10-CM

## 2020-12-21 NOTE — Progress Notes (Signed)
Chief Complaint  Patient presents with  . Post-op Follow-up    Right hip 11/13/20    PRE-OPERATIVE DIAGNOSIS:  right hip fracture, femoral neck   POST-OPERATIVE DIAGNOSIS:  right hip fracture, femoral neck   PROCEDURE:  Procedure(s): ARTHROPLASTY BIPOLAR HIP (HEMIARTHROPLASTY) (Right)   DEPUY 3PF STEM , 45 HEAD - OUTER; 28 INNER + 8.5   APPROACH: DIRECT LATERAL  Complications: We are concerned of a possible aspiration into the left lung field.  FINDINGS: Comminuted fracture femoral neck completely displaced.  Acetabulum no arthritis  75 year old female status post bipolar the right hip she is doing well she is using a cane she is anxious to cut her grass on a riding mower  Her hip looks good moves well leg lengths are equal gait is improving recommend for 76-month follow-up

## 2020-12-22 DIAGNOSIS — I129 Hypertensive chronic kidney disease with stage 1 through stage 4 chronic kidney disease, or unspecified chronic kidney disease: Secondary | ICD-10-CM | POA: Diagnosis not present

## 2020-12-22 DIAGNOSIS — E039 Hypothyroidism, unspecified: Secondary | ICD-10-CM | POA: Diagnosis not present

## 2020-12-22 DIAGNOSIS — S72001D Fracture of unspecified part of neck of right femur, subsequent encounter for closed fracture with routine healing: Secondary | ICD-10-CM | POA: Diagnosis not present

## 2020-12-22 DIAGNOSIS — E7800 Pure hypercholesterolemia, unspecified: Secondary | ICD-10-CM | POA: Diagnosis not present

## 2020-12-22 DIAGNOSIS — N1831 Chronic kidney disease, stage 3a: Secondary | ICD-10-CM | POA: Diagnosis not present

## 2020-12-22 DIAGNOSIS — F339 Major depressive disorder, recurrent, unspecified: Secondary | ICD-10-CM | POA: Diagnosis not present

## 2020-12-28 ENCOUNTER — Telehealth: Payer: Self-pay | Admitting: Orthopedic Surgery

## 2020-12-28 ENCOUNTER — Encounter: Payer: Self-pay | Admitting: Radiology

## 2020-12-28 ENCOUNTER — Inpatient Hospital Stay: Payer: 59 | Attending: Physician Assistant | Admitting: Physical Therapist

## 2020-12-28 DIAGNOSIS — M5459 Other low back pain: Secondary | ICD-10-CM | POA: Insufficient documentation

## 2020-12-28 NOTE — Telephone Encounter (Signed)
I just spoke with them and am in process of sending note.

## 2020-12-28 NOTE — Progress Notes (Signed)
Patient will need pre medication for any dental procedures.   ~ Keflex 2000 mg PO 1 hour prior to procedure.    Faxed this to dentist at [phone].

## 2020-12-28 NOTE — Telephone Encounter (Signed)
Call received from [facility] [phone] - relays patient arrived for dental visit and relayed that she had hip surgery done in March. Upon my relaying that I would send a message to clinic staff to inquire about pre-med antibiotics, the staff person relayed that she was just told that patient called in and confirmed with our office * * I do not see a note in reference to patient receiving any confirmation. Please advise.

## 2020-12-28 NOTE — PT/OT Therapy Note (Signed)
Name: LAKIN RHINE Age: 75 y.o.   Date of Service: 12/28/2020  Referring Physician: Hilarie Fredrickson, PA   Date of Injury: 07/04/2020  Date Care Plan Established/Reviewed: 11/28/2020  Date Treatment Started: 11/28/2020  End of Certification Date: 02/25/2021  Sessions in Plan of Care: 12  Surgery Date: No data was found  MD Follow-up: No data was found  Medbridge Code: No data was found    Visit Count: 6   Diagnosis:   1. Other low back pain        Subjective     Daily Subjective   Pt reports no change in symptoms since starting PT. She gets relief with the HEP but then as she continues with her normal daily activities the pain comes back. Continues to have pain with turning bed. Would like for next session to be her last session and she'll continue with the exercises on her own. Reports she felt good after last session.    Social Support/Occupation  Lives in: condominium  Lives with: adult children  Occupation: Retired      Precautions: No data was found  Allergies: Aspirin, Peppers, Pollen extract, and Latex                        Initial Evaluation Reference and/or Current Measurements(as dated):    OBJECTIVE  (11/28/20)  Palpation: Tender to palpation (TTP) over bil lumbar paraspinals L1-L5, R=L     Lumbar ROM (deg) 11/28/20  AROM   Flexion Fingertips to floor   Extension 20deg   Repeated lumbar extension in STANDING Increase LBP, no worse   Repeated Lumbar Extension in PRONE Increase LBP, no worse   Repeated lumbar extension in lying with PT overpressure NT   Hips off-center R/L NT   Repeated extension in lying with hips off-center R/L NT   (blank fields were intentionally left blank)    Quads in prone: left 103deg; R 108deg    11/28/20  LEFT 12/12/20  LEFT Lumbar/Hip  Strength (_/5) 11/28/20  RIGHT 12/12/20  RIGHT   4+  Hip Flexion 4+    3+ 4+ Hip Abduction 3+ 4   3 4  Hip Extension 3 4   4+  Knee Flexion 4+    4  Knee Extension 4    4+  Ankle Dorsiflexion 4+    4(MMT)  Ankle Plantarflexion 4(MMT)    (blank  fields were intentionally left blank)  (Ankle plantarflexion measured as number of reps standing SL [25 reps being 5/5 MMT] unless otherwise specified)    SPECIAL TESTS  Lumbar quadrant: negative   Slump: negative   Lumbar P/A central: grade 2 hypomobility L1-L5   Lumbar P/A unilateral: grade 2 hypomobility L1-L5       Treatment     Therapeutic Exercises - Justified to address any of the following: develop strength, endurance, ROM and/or flexibility.   Dynamic warm-up on recumbent bike to improve blood flow prior to mobility and strengthening. Subjective gained during this time to guide today's treatment.     Time spent administering FOTO to guide POC    TE per flow:  - Continued prone press-ups, cues for keeping hips on table for increased lumbar spine extension mobility  - Cont hamstring and quad stretching for flfexibility   - Reviewed glut stretch with knee to opposite shoulder     - Reviewed standing hip abduction  + open books for thoracic mobility   - Progressed sets of figure- four bridge  Updated HEP with bridges and open books, reviewed with pot for proper form and dosage     Neuromuscular Re-Education - Justified to address any of the following: of movement, balance, coordination, kinesthetic sense, posture and/or proprioception for sitting and/or standing activities.                      ---    Flowsheet Row ---   Total Time    Timed Minutes 40 minutes   Total Time 40 minutes        Assessment   Added open books for thoracic mobility and continued with proximal core and glute strengthening due to subjective reports of continued difficulty turning in bed. Progressed sets of bridges for glute max strengthening to improve bed mobility. Pt tolerated well without increase in symptoms. Updated HEP with progressions. Pt on track for d/c next session per pt request, for full review of HEP to ensure no exacerbation of symptoms with updated HEP and to ensure independence with HEP following d/c.    Plan    D/c  next session - pt aware      Goals    Goal 1: Patient will demonstrate independence in prescribed HEP with proper form, sets and reps for safe discharge to an independent program.   Sessions: 12      Goal 2: Increase hip extension strength 4/5  to allow patient to demonstrate proper lifting technique of 10 lbs from floor for cleaning bath tub and floors.    4/11 - Objective measures met, but NMR still needed to engage glute ms with functional activities. Pt has not attempted lifting anything from the floor yet.  - TC   Sessions: 12      Goal 3: Increase lumbar extension ROM to 30 degrees to allow patient to stand for >30 minutes without pain to perform meal preparation.   Sessions: 46                                   Rennie Natter, DPT      Exercise Flow Sheet    Precautions:  None; bil knee pain     MD Follow-up:  TBD      Exercise Specifics 12/01/20 12/08/20 12/12/20 12/19/20 12/28/20    PN / Janyth Contes   Bike    recumbent bike 60 rpm 6' lvl 1  6' 60 rpms  6' lvl 1 6'    Prone press up x10 lvl 1 6'           Prone quad stretch    3x30" bilat. 2x30"  bilat 2x45"  bilat 2x45"  bilat    Glut stretch 3x30" bil 2x30"  bilat    Glut stretch 3x30" bil       Prone hip ext    3x10 bilat. 3x10   bilat Quadruped  Hip ext  2x10 bilat -- Open books x10 ea.       Clams    3x10 bilat. 3x10  bilat 3x10 --        DWS    3x30" 3x30" -- -- -       hamstring stretch    Supine modified to seated 3x30" bilat. Seated  2x30"  bilat Supine with strap  2x45"  bilat --> Supine with strap  2x30"  bilat       Prone press-ups  5x10" 2x30" 2x30" 10x5" 10x5"  Bridges  2x10 2x10 figure 4 bridge 2x10 bil  figure 4 bridge 3x10 bil              Standing hip abd  2x10 bilat Standing hip abd  2x10 bilat RTB Standing hip abd  2x10 bilat RTB                                     Home Exercise Program    Reviewed / Updated    -- updated       (all exercises were completed with therapist supervision unless noted)    (White date box = 2 week treatment  reminder)    Patient HEP:  Access Code: [email or web address]  URL: [email or web address]  Date: 12/28/2020  Prepared by: Rennie Natter    Exercises  Standing Lumbar Extension - 5 x daily - 7 x weekly - 5 reps - 5sec hold  Sidelying Open Book Thoracic Lumbar Rotation and Extension - 1 x daily - 7 x weekly - 3 sets - 10 reps  Prone Press Up - 2 x daily - 7 x weekly - 5 reps - 5sec hold  Static Prone on Elbows - 2 x daily - 7 x weekly - hold  Prone Hip Extension - 1 x daily - 7 x weekly - 3 sets - 10 reps  Clamshell - 1 x daily - 7 x weekly - 3 sets - 10 reps  Seated Hamstring Stretch - 1 x daily - 7 x weekly - 3 sets - 30 sec hold  Figure 4 Bridge - 1 x daily - 7 x weekly - 3 sets - 10 reps  Standing Hip Abduction with Counter Support - 1 x daily - 7 x weekly - 3 sets - 10 reps

## 2020-12-29 DIAGNOSIS — N1831 Chronic kidney disease, stage 3a: Secondary | ICD-10-CM | POA: Diagnosis not present

## 2020-12-29 DIAGNOSIS — E7800 Pure hypercholesterolemia, unspecified: Secondary | ICD-10-CM | POA: Diagnosis not present

## 2020-12-29 DIAGNOSIS — Z96641 Presence of right artificial hip joint: Secondary | ICD-10-CM | POA: Diagnosis not present

## 2020-12-29 DIAGNOSIS — F339 Major depressive disorder, recurrent, unspecified: Secondary | ICD-10-CM | POA: Diagnosis not present

## 2020-12-29 DIAGNOSIS — K219 Gastro-esophageal reflux disease without esophagitis: Secondary | ICD-10-CM | POA: Diagnosis not present

## 2020-12-29 DIAGNOSIS — I129 Hypertensive chronic kidney disease with stage 1 through stage 4 chronic kidney disease, or unspecified chronic kidney disease: Secondary | ICD-10-CM | POA: Diagnosis not present

## 2020-12-29 DIAGNOSIS — S72001D Fracture of unspecified part of neck of right femur, subsequent encounter for closed fracture with routine healing: Secondary | ICD-10-CM | POA: Diagnosis not present

## 2020-12-29 DIAGNOSIS — Z9181 History of falling: Secondary | ICD-10-CM | POA: Diagnosis not present

## 2020-12-29 DIAGNOSIS — E039 Hypothyroidism, unspecified: Secondary | ICD-10-CM | POA: Diagnosis not present

## 2020-12-30 DIAGNOSIS — F339 Major depressive disorder, recurrent, unspecified: Secondary | ICD-10-CM | POA: Diagnosis not present

## 2020-12-30 DIAGNOSIS — E7800 Pure hypercholesterolemia, unspecified: Secondary | ICD-10-CM | POA: Diagnosis not present

## 2020-12-30 DIAGNOSIS — N1831 Chronic kidney disease, stage 3a: Secondary | ICD-10-CM | POA: Diagnosis not present

## 2020-12-30 DIAGNOSIS — S72001D Fracture of unspecified part of neck of right femur, subsequent encounter for closed fracture with routine healing: Secondary | ICD-10-CM | POA: Diagnosis not present

## 2020-12-30 DIAGNOSIS — E039 Hypothyroidism, unspecified: Secondary | ICD-10-CM | POA: Diagnosis not present

## 2020-12-30 DIAGNOSIS — I129 Hypertensive chronic kidney disease with stage 1 through stage 4 chronic kidney disease, or unspecified chronic kidney disease: Secondary | ICD-10-CM | POA: Diagnosis not present

## 2021-01-02 ENCOUNTER — Encounter: Payer: Self-pay | Admitting: Family Medicine

## 2021-01-02 ENCOUNTER — Telehealth: Payer: Self-pay

## 2021-01-02 NOTE — Telephone Encounter (Signed)
She can resume hctz 12.5 mg poqday if BP > 140//90

## 2021-01-03 ENCOUNTER — Encounter: Payer: Self-pay | Admitting: Family Medicine

## 2021-01-03 ENCOUNTER — Inpatient Hospital Stay: Payer: 59 | Attending: Physician Assistant | Admitting: Rehabilitative and Restorative Service Providers"

## 2021-01-03 DIAGNOSIS — M5459 Other low back pain: Secondary | ICD-10-CM

## 2021-01-03 DIAGNOSIS — N1831 Chronic kidney disease, stage 3a: Secondary | ICD-10-CM | POA: Diagnosis not present

## 2021-01-03 DIAGNOSIS — I129 Hypertensive chronic kidney disease with stage 1 through stage 4 chronic kidney disease, or unspecified chronic kidney disease: Secondary | ICD-10-CM | POA: Diagnosis not present

## 2021-01-03 DIAGNOSIS — F339 Major depressive disorder, recurrent, unspecified: Secondary | ICD-10-CM | POA: Diagnosis not present

## 2021-01-03 DIAGNOSIS — E039 Hypothyroidism, unspecified: Secondary | ICD-10-CM | POA: Diagnosis not present

## 2021-01-03 DIAGNOSIS — E7800 Pure hypercholesterolemia, unspecified: Secondary | ICD-10-CM | POA: Diagnosis not present

## 2021-01-03 DIAGNOSIS — S72001D Fracture of unspecified part of neck of right femur, subsequent encounter for closed fracture with routine healing: Secondary | ICD-10-CM | POA: Diagnosis not present

## 2021-01-03 NOTE — Telephone Encounter (Signed)
Spoke with pt, indicates understanding

## 2021-01-03 NOTE — PT/OT Therapy Note (Signed)
Name: Helen [name] Age: 75 y.o.   Date of Service: 01/03/2021  Referring Physician: Hilarie Fredrickson, PA   Date of Injury: 07/04/2020  Date Care Plan Established/Reviewed: 11/28/2020  Date Treatment Started: 11/28/2020  End of Certification Date: 02/25/2021  Sessions in Plan of Care: 12  Surgery Date: No data was found  MD Follow-up: No data was found  Medbridge Code: No data was found    Visit Count: 7   Diagnosis:   1. Other low back pain        Subjective     Outcome Measure   Tool Used/Details: FOTO  Score: 56  Predicted Functional Outcome: 65    Daily Subjective   Pt reports that her back is acting up because of the weather but she's dealing with it.   Lifting  standing    Social Support/Occupation  Lives in: condominium  Lives with: adult children  Occupation: Retired      Precautions: No data was found  Allergies: Aspirin, Peppers, Pollen extract, and Latex                        Initial Evaluation Reference and/or Current Measurements(as dated):    OBJECTIVE  (11/28/20)  Palpation: Tender to palpation (TTP) over bil lumbar paraspinals L1-L5, R=L     Lumbar ROM (deg) 11/28/20  AROM 01/03/21 AROM   Flexion Fingertips to floor    Extension 20deg 20 degrees   Repeated lumbar extension in STANDING Increase LBP, no worse    Repeated Lumbar Extension in PRONE Increase LBP, no worse    Repeated lumbar extension in lying with PT overpressure NT    Hips off-center R/L NT    Repeated extension in lying with hips off-center R/L NT    (blank fields were intentionally left blank)    Quads in prone: left 103deg; R 108deg    11/28/20  LEFT 12/12/20  LEFT 01/03/21 Left Lumbar/Hip  Strength (_/5) 11/28/20  RIGHT 12/12/20  RIGHT 01/03/21 Right   4+   Hip Flexion 4+     3+ 4+ 4 Hip Abduction 3+ 4 4+   3 4 4+ Hip Extension 3 4 4    4+   Knee Flexion 4+     4   Knee Extension 4     4+   Ankle Dorsiflexion 4+     4(MMT)   Ankle Plantarflexion 4(MMT)     (blank fields were intentionally left blank)  (Ankle plantarflexion measured as number  of reps standing SL [25 reps being 5/5 MMT] unless otherwise specified)    SPECIAL TESTS  Lumbar quadrant: negative   Slump: negative   Lumbar P/A central: grade 2 hypomobility L1-L5   Lumbar P/A unilateral: grade 2 hypomobility L1-L5       Treatment     Therapeutic Exercises - Justified to address any of the following: develop strength, endurance, ROM and/or flexibility.   Dynamic warm-up on recumbent bike to improve blood flow prior to mobility and strengthening. Subjective gained during this time to guide today's treatment.     Objective measures taken    Reviewed full HEP program with education/instruction on how to progress exercises (frequency/duration) with sets, reps, hold times, and resistance to promote continued improvement for flexibility/strength.  Special attention given to instruct patient to not push HEP to the point of pain and to contact our office if questions occur. Advised to follow-up with MD as prescribed/as needed.  Corrections/Modifications made, added resistance with  SL clams/bridge/ standing hip abduction, figure 4 bridge with increase knee flexion, added reverse clams without resistance, modified prone hip extension to standing with trunk on table.    Handout provided to patient with above information and phone numbers.      Neuromuscular Re-Education - Justified to address any of the following: of movement, balance, coordination, kinesthetic sense, posture and/or proprioception for sitting and/or standing activities.                      ---    Flowsheet Row ---   Total Time    Timed Minutes 40 minutes   Total Time 40 minutes        Assessment   Pt has been treated in PT for 7 visits. She demonstrates overall improvement with bilateral hip abduction and extension strength. Lumbar extension ROM has remained the same. Pt exhibits no change in FOTO score compared to evaluation.   Functionally she is able to tolerate standing for 30 minutes before needing a breaks. She reports her tolerance  is the same, 30 minutes. She is able to pick up groceries but states she won't pick up anything that feels to heavy. Secondary to current functional status without significant improvement and pt requesting to end PT, pt will be [place] today.  Pt would like to make today her last PT session and continue independently with her HEP. Time was taken to updated and review HEP. Pt's questions were answered.     Plan   [place] from PT at this time.       Goals    Goal 1: Patient will demonstrate independence in prescribed HEP with proper form, sets and reps for safe discharge to an independent program.    01/03/21: updated HEP for [place] today. AJ   Sessions: 12      Goal 2: Increase hip extension strength 4/5  to allow patient to demonstrate proper lifting technique of 10 lbs from floor for cleaning bath tub and floors.    01/03/21: hip extension strength 4/5. able to pick up and carry groceries, pt states she won't pick up something that feels too heavy and has her daughter pick it up; doesn't give specific weight. AJ   Sessions: 12      Goal 3: Increase lumbar extension ROM to 30 degrees to allow patient to stand for >30 minutes without pain to perform meal preparation.    01/03/21: lumbar extension 20 degrees. Can tolerate standing for 30 minutes then need to sit. AJ   Sessions: 27                                   Marcelene Butte [place], DPT      Exercise Flow Sheet    Precautions:  None; bil knee pain     MD Follow-up:  TBD      Exercise Specifics 12/01/20 12/08/20 12/12/20 12/19/20 12/28/20 01/03/21   PN / Janyth Contes   Bike    recumbent bike 60 rpm 6' lvl 1  6' 60 rpms  6' lvl 1 6'    Prone press up x10 lvl 1 6'     recumb bike x6'      Prone quad stretch    3x30" bilat. 2x30"  bilat 2x45"  bilat 2x45"  bilat    Glut stretch 3x30" bil 2x30"  bilat    Glut stretch 3x30" bil --  Prone hip ext    3x10 bilat. 3x10   bilat Quadruped  Hip ext  2x10 bilat -- Open books x10 ea. --      Clams    3x10 bilat. 3x10  bilat 3x10 --  Clams RTB x10 bil    Reverse  clams no band x10 bil      DWS    3x30" 3x30" -- -- - x1'      hamstring stretch    Supine modified to seated 3x30" bilat. Seated  2x30"  bilat Supine with strap  2x45"  bilat --> Supine with strap  2x30"  bilat --      Prone press-ups  5x10" 2x30" 2x30" 10x5" 10x5" --           Bridges  2x10 2x10 figure 4 bridge 2x10 bil  figure 4 bridge 3x10 bil  figure 4 bridge x10    DL bridge with RTB Z61            Standing hip abd  2x10 bilat Standing hip abd  2x10 bilat RTB Standing hip abd  2x10 bilat RTB Standing hip abd and ext x10bil each                                     Home Exercise Program    Reviewed / Updated    -- updated Reviewed and updated HEP      (all exercises were completed with therapist supervision unless noted)    (White date box = 2 week treatment reminder)    Patient HEP:  Access Code: [email or web address]  URL: [email or web address]  Date: 01/03/2021  Prepared by: Izza Bickle [place]    Exercises  Standing Lumbar Extension - 5 x daily - 7 x weekly - 5 reps - 5sec hold  Doorway Pec Stretch at 60 Elevation - 1 x daily - 7 x weekly - 3 sets - 30 seconds hold  Sidelying Open Book Thoracic Lumbar Rotation and Extension - 1 x daily - 7 x weekly - 2 sets - 10 reps - 5 seconds hold  Prone Press Up - 2 x daily - 7 x weekly - 5 reps - 5sec hold  Static Prone on Elbows - 2 x daily - 7 x weekly - hold  Seated Hamstring Stretch - 1 x daily - 7 x weekly - 3 sets - 30 sec hold  Prone Hip Extension on Table - 1 x daily - 4 x weekly - 3 sets - 10 reps  Figure 4 Bridge - 1 x daily - 3 x weekly - 3 sets - 10 reps  Supine Bridge with Resistance Band - 1 x daily - 3 x weekly - 3 sets - 10 reps  Clamshell with Resistance - 1 x daily - 3 x weekly - 3 sets - 10 reps  Sidelying Reverse Clamshell - 1 x daily - 3 x weekly - 3 sets - 10 reps  Standing Hip Abduction with Resistance at Ankles and Counter Support - 1 x daily - 3 x weekly - 3 sets - 10 reps

## 2021-01-03 NOTE — Progress Notes (Signed)
Name: Helen [name] Age: 75 y.o.   Date of Service: 01/03/2021  Referring Physician: Hilarie Fredrickson, PA   Date of Injury: 07/04/2020  Date Care Plan Established/Reviewed: 11/28/2020  Date Treatment Started: 11/28/2020  End of Certification Date: 02/25/2021  Sessions in Plan of Care: 12  Surgery Date: No data was found  MD Follow-up: No data was found  Medbridge Code: No data was found    Visit Count: 7   Diagnosis:   1. Other low back pain        Subjective     Outcome Measure   Tool Used/Details: FOTO  Score: 56  Predicted Functional Outcome: 65    Daily Subjective   Pt reports that her back is acting up because of the weather but she's dealing with it.   Lifting  standing    Social Support/Occupation  Lives in: condominium  Lives with: adult children  Occupation: Retired      Precautions: No data was found  Allergies: Aspirin, Peppers, Pollen extract, and Latex                        Initial Evaluation Reference and/or Current Measurements(as dated):    OBJECTIVE  (11/28/20)  Palpation: Tender to palpation (TTP) over bil lumbar paraspinals L1-L5, R=L     Lumbar ROM (deg) 11/28/20  AROM 01/03/21 AROM   Flexion Fingertips to floor    Extension 20deg 20 degrees   Repeated lumbar extension in STANDING Increase LBP, no worse    Repeated Lumbar Extension in PRONE Increase LBP, no worse    Repeated lumbar extension in lying with PT overpressure NT    Hips off-center R/L NT    Repeated extension in lying with hips off-center R/L NT    (blank fields were intentionally left blank)    Quads in prone:left 103deg; R 108deg    11/28/20  LEFT 12/12/20  LEFT 01/03/21 Left Lumbar/Hip  Strength (_/5) 11/28/20  RIGHT 12/12/20  RIGHT 01/03/21 Right   4+   Hip Flexion 4+     3+ 4+ 4 Hip Abduction 3+ 4 4+   3 4 4+ Hip Extension 3 4 4    4+   Knee Flexion 4+     4   Knee Extension 4     4+   Ankle Dorsiflexion 4+     4(MMT)   Ankle Plantarflexion 4(MMT)     (blank fields were intentionally left  blank)  (Ankle plantarflexion measured as number of reps standing SL [25 reps being 5/5 MMT] unless otherwise specified)    SPECIAL TESTS  Lumbar quadrant: negative   Slump: negative   Lumbar P/A central: grade 2 hypomobility L1-L5   Lumbar P/A unilateral: grade 2 hypomobility L1-L5      Assessment   Pt has been treated in PT for 7 visits. She demonstrates overall improvement with bilateral hip abduction and extension strength. Lumbar extension ROM has remained the same. Pt exhibits no change in FOTO score compared to evaluation.   Functionally she is able to tolerate standing for 30 minutes before needing a breaks. She reports her tolerance is the same, 30 minutes. She is able to pick up groceries but states she won't pick up anything that feels to heavy. Secondary to current functional status without significant improvement and pt requesting to end PT, pt will be [place] today.  Pt would like to make today her last PT session and continue independently with her HEP. Time was  taken to updated and review HEP. Pt's questions were answered.    Plan   [place] from PT at this time.          Goals    Goal 1: Patient will demonstrate independence in prescribed HEP with proper form, sets and reps for safe discharge to an independent program.    01/03/21: updated HEP for [place] today. AJ   Sessions: 12      Goal 2: Increase hip extension strength 4/5  to allow patient to demonstrate proper lifting technique of 10 lbs from floor for cleaning bath tub and floors.    01/03/21: hip extension strength 4/5. able to pick up and carry groceries, pt states she won't pick up something that feels too heavy and has her daughter pick it up; doesn't give specific weight. AJ   Sessions: 12      Goal 3: Increase lumbar extension ROM to 30 degrees to allow patient to stand for >30 minutes without pain to perform meal preparation.    01/03/21: lumbar extension 20 degrees. Can tolerate standing for 30 minutes then need to sit. AJ   Sessions: 20                                    Mekiah Wahler R [place], DPT

## 2021-01-04 NOTE — Telephone Encounter (Signed)
Ok to refill 

## 2021-01-05 DIAGNOSIS — E039 Hypothyroidism, unspecified: Secondary | ICD-10-CM | POA: Diagnosis not present

## 2021-01-05 DIAGNOSIS — N1831 Chronic kidney disease, stage 3a: Secondary | ICD-10-CM | POA: Diagnosis not present

## 2021-01-05 DIAGNOSIS — E7800 Pure hypercholesterolemia, unspecified: Secondary | ICD-10-CM | POA: Diagnosis not present

## 2021-01-05 DIAGNOSIS — S72001D Fracture of unspecified part of neck of right femur, subsequent encounter for closed fracture with routine healing: Secondary | ICD-10-CM | POA: Diagnosis not present

## 2021-01-05 DIAGNOSIS — I129 Hypertensive chronic kidney disease with stage 1 through stage 4 chronic kidney disease, or unspecified chronic kidney disease: Secondary | ICD-10-CM | POA: Diagnosis not present

## 2021-01-05 DIAGNOSIS — F339 Major depressive disorder, recurrent, unspecified: Secondary | ICD-10-CM | POA: Diagnosis not present

## 2021-01-05 MED ORDER — DIPHENOXYLATE-ATROPINE 2.5-0.025 MG PO TABS: 1.0000 | ORAL_TABLET | Freq: Four times a day (QID) | ORAL | 0 refills | Status: DC | PRN

## 2021-01-09 ENCOUNTER — Other Ambulatory Visit: Payer: Self-pay

## 2021-01-09 ENCOUNTER — Other Ambulatory Visit: Payer: Medicare Other

## 2021-01-09 DIAGNOSIS — R5383 Other fatigue: Secondary | ICD-10-CM

## 2021-01-09 DIAGNOSIS — E611 Iron deficiency: Secondary | ICD-10-CM | POA: Diagnosis not present

## 2021-01-09 DIAGNOSIS — N83292 Other ovarian cyst, left side: Secondary | ICD-10-CM | POA: Diagnosis not present

## 2021-01-10 ENCOUNTER — Encounter (HOSPITAL_BASED_OUTPATIENT_CLINIC_OR_DEPARTMENT_OTHER): Payer: Self-pay | Admitting: Obstetrics and Gynecology

## 2021-01-10 ENCOUNTER — Other Ambulatory Visit: Payer: Self-pay

## 2021-01-10 ENCOUNTER — Encounter: Payer: Self-pay | Admitting: Family Medicine

## 2021-01-10 LAB — IRON: Iron: 62 ug/dL (ref 45–160)

## 2021-01-10 LAB — MAGNESIUM: Magnesium: 1.8 mg/dL (ref 1.5–2.5)

## 2021-01-10 NOTE — Progress Notes (Addendum)
ADDENDUM:  Received call from Dr [facility] office, spoke w/ Stanton Kidney, stated per Dr Matthew Saras pt is mostly likely going home same day and if had to to laparotomy due to adhesions would stay the night but for pt to plan going home same day.  Called and spoke w/ pt via phone, she verbalized understanding to plan to go home same day and have caregiver at home for 24 hours.   Spoke w/ via phone for pre-op interview--- PT Lab needs dos---- CBC, T&S, Istat              Lab results------ current ekg in epic/ chart COVID test ------ 01-12-2021 @ 1400 Arrive at ------- 0530 on 01-16-2021 NPO after MN NO Solid Food.  Clear liquids from MN until--- 0430 Med rec completed Medications to take morning of surgery ----- Lipitor, Synthroid, Protonix, Acyclovir  Diabetic medication ----- n/a Patient instructed to bring photo id and insurance card day of surgery Patient aware to have Driver (ride ) / caregiver    for 24 hours after surgery-- son, Clarisse Gouge  Patient Special Instructions ----- pt stated was told by dr Matthew Saras that she was staying overnight .  Reviewed RCC and visitor instructions  Pre-Op special Istructions ----- left message w/ Stanton Kidney, [place] scheduler for Dr Matthew Saras, to verify overnight since case is posted go home same day. Patient verbalized understanding of instructions that were given at this phone interview. Patient denies shortness of breath, chest pain, fever, cough at this phone interview.

## 2021-01-11 ENCOUNTER — Encounter: Payer: Self-pay | Admitting: Family Medicine

## 2021-01-11 ENCOUNTER — Encounter (HOSPITAL_BASED_OUTPATIENT_CLINIC_OR_DEPARTMENT_OTHER): Payer: Self-pay | Admitting: Obstetrics and Gynecology

## 2021-01-11 ENCOUNTER — Other Ambulatory Visit (INDEPENDENT_AMBULATORY_CARE_PROVIDER_SITE_OTHER): Payer: Self-pay | Admitting: Family

## 2021-01-11 DIAGNOSIS — E119 Type 2 diabetes mellitus without complications: Secondary | ICD-10-CM

## 2021-01-12 ENCOUNTER — Other Ambulatory Visit (HOSPITAL_COMMUNITY)
Admission: RE | Admit: 2021-01-12 | Discharge: 2021-01-12 | Disposition: A | Discharge status: Home / Self Care | Payer: Medicare Other | Source: Ambulatory Visit | Attending: Obstetrics and Gynecology | Admitting: Obstetrics and Gynecology

## 2021-01-12 DIAGNOSIS — Z20822 Contact with and (suspected) exposure to covid-19: Secondary | ICD-10-CM | POA: Insufficient documentation

## 2021-01-12 DIAGNOSIS — Z01812 Encounter for preprocedural laboratory examination: Secondary | ICD-10-CM | POA: Diagnosis not present

## 2021-01-12 NOTE — H&P (Signed)
NAME: April Beck, April Beck MEDICAL RECORD NO: [id number] ACCOUNT NO: [id number] DATE OF BIRTH: October 01, 1945 PHYSICIAN: Ralene Bathe. Matthew Saras, MD  History and Physical   DATE OF ADMISSION: 01/16/2021  CHIEF COMPLAINT:  Pelvic pain, left ovarian cyst.  HISTORY OF PRESENT ILLNESS:  A 75 year old G2 P2 who has had a prior hysterectomy in [place], I had actually laparoscoped her in 1991, at which time she had some bilateral adnexal adhesions, worse on the left.  More recently, MRI was done by another physician that showed a left ovarian cyst 3.9 x 3.6 x 4.1 with a thin internal septation, small nodule inferiorly.  CA-125 was normal at 7 and CEA was normal also.  She had consulted a different gynecologist who performed an ultrasound that showed the uterus surgically absent, small right ovary with a cyst as noted on MRI on the left.  No free fluid.  She thought about getting a schedule with that physician, never  could get it worked out.  Presented to see me recently.  We have reviewed all of these findings.  Because of her concern about the cyst and pelvic discomfort, she presents at this time for laparoscopic BSO, possible open laparotomy with BSO.  This procedure including specific  risks regarding bleeding, infection, transfusion, wound infection, phlebitis, possible need to complete the surgery via open technique, adjacent organ injury along with her expected recovery time reviewed, which she understands and accepts.  PAST MEDICAL HISTORY:  ALLERGIES:  None.  CURRENT MEDICATIONS: Acyclovir 400 mg daily, atorvastatin 40 mg daily, citalopram 10 mg daily, Valium 5 mg at bedtime p.r.n., levothyroxine 50 mcg daily, meloxicam 15 mg daily, pantoprazole 40 mg daily, KCl daily and Retin-A 0.025% cream.  PAST SURGICAL HISTORY:  Diagnostic laparoscopy by me in 1991, cholecystectomy, hysterectomy, tubal, prior incontinence surgery; she could not remember if that was a simple sling, and hip replacement  12/14/2020.  FAMILY HISTORY:  Significant for brother with heart disease, diabetes, Alzheimer disease.  Father had a CVA.  Brother also had Parkinson's disease.  OBSTETRICAL HISTORY:  She is a G2, P2.  PHYSICAL EXAMINATION:  VITAL SIGNS:  Temperature 98.2, blood pressure 130/72. HEENT:  Unremarkable. NECK:  Supple, without masses. LUNGS:  Clear. CARDIOVASCULAR:  Regular rate and rhythm without murmurs, rubs or gallops. BREASTS:  Normal. ABDOMEN:  Soft, flat, nontender. PELVIC:  Vulva, vagina, vaginal cuff showed some atrophic changes.  Bimanual slightly tender.  Left, no definite mass. EXTREMITIES:  Unremarkable. NEUROLOGIC:  Unremarkable.  IMPRESSION:  Left ovarian cyst, history of bilateral adnexal adhesions by previous laparoscopy, prior hysterectomy.  Her CA-125 and CEA are normal.  PLAN:  Laparoscopic BSO, possible laparotomy with BSO.  Procedure and risks discussed as above. [name]      PAA D: 01/09/2021 2:30:52 pm T: 01/10/2021 2:13:00 am  JOB: [id number]/ [id number]

## 2021-01-13 LAB — SARS CORONAVIRUS 2 (TAT 6-24 HRS): SARS Coronavirus 2: NEGATIVE

## 2021-01-15 NOTE — Anesthesia Preprocedure Evaluation (Addendum)
Anesthesia Evaluation  Patient identified by MRN, date of birth, ID band Patient awake    Reviewed: Allergy & Precautions, NPO status , Patient's Chart, lab work & pertinent test results  History of Anesthesia Complications (+) history of anesthetic complications (aspiration during hip surgery under spinal; did not require O2/intubation post-op)  Airway Mallampati: II  TM Distance: >3 FB Neck ROM: Full    Dental  (+) Dental Advisory Given, Loose,  Bottom front bridge loose due to recent abscess/root canal:   Pulmonary  H/o COVID-19   Pulmonary exam normal breath sounds clear to auscultation       Cardiovascular Exercise Tolerance: Good hypertension, Normal cardiovascular exam Rhythm:Regular Rate:Normal  13-Nov-2020 10:21:25 [facility]-AP-300 ROUTINE RECORD Normal sinus rhythm Normal ECG No significant change since last tracing Confirmed by Glori Bickers [phone]) on 11/13/2020 4:06:37 PM   Neuro/Psych PSYCHIATRIC DISORDERS Anxiety Depression negative neurological ROS     GI/Hepatic GERD  ,  Endo/Other  Hypothyroidism   Renal/GU Renal InsufficiencyRenal disease     Musculoskeletal  (+) Arthritis ,   Abdominal   Peds  Hematology  (+) anemia ,   Anesthesia Other Findings TMJ  Reproductive/Obstetrics                           Anesthesia Physical Anesthesia Plan  ASA: II  Anesthesia Plan: General   Post-op Pain Management:    Induction: Intravenous  PONV Risk Score and Plan: 3 and Treatment may vary due to age or medical condition, Ondansetron and Dexamethasone  Airway Management Planned: Oral ETT  Additional Equipment: None  Intra-op Plan:   Post-operative Plan: Extubation in OR  Informed Consent: I have reviewed the patients History and Physical, chart, labs and discussed the procedure including the risks, benefits and alternatives for the proposed anesthesia  with the patient or authorized representative who has indicated his/her understanding and acceptance.     Dental advisory given  Plan Discussed with: Anesthesiologist and CRNA  Anesthesia Plan Comments:        Anesthesia Quick Evaluation

## 2021-01-16 ENCOUNTER — Observation Stay (HOSPITAL_BASED_OUTPATIENT_CLINIC_OR_DEPARTMENT_OTHER)
Admission: RE | Admit: 2021-01-16 | Discharge: 2021-01-17 | Disposition: A | Discharge status: Home / Self Care | Payer: Medicare Other | Attending: Obstetrics and Gynecology | Admitting: Obstetrics and Gynecology

## 2021-01-16 ENCOUNTER — Encounter (HOSPITAL_BASED_OUTPATIENT_CLINIC_OR_DEPARTMENT_OTHER): Payer: Self-pay | Admitting: Obstetrics and Gynecology

## 2021-01-16 ENCOUNTER — Observation Stay (HOSPITAL_BASED_OUTPATIENT_CLINIC_OR_DEPARTMENT_OTHER): Payer: Medicare Other | Admitting: Anesthesiology

## 2021-01-16 ENCOUNTER — Encounter (HOSPITAL_COMMUNITY)
Admission: RE | Disposition: A | Discharge status: Home / Self Care | Payer: Self-pay | Source: Home / Self Care | Attending: Obstetrics and Gynecology

## 2021-01-16 ENCOUNTER — Other Ambulatory Visit: Payer: Self-pay

## 2021-01-16 DIAGNOSIS — N736 Female pelvic peritoneal adhesions (postinfective): Secondary | ICD-10-CM | POA: Diagnosis not present

## 2021-01-16 DIAGNOSIS — N83202 Unspecified ovarian cyst, left side: Secondary | ICD-10-CM | POA: Diagnosis not present

## 2021-01-16 DIAGNOSIS — N83209 Unspecified ovarian cyst, unspecified side: Secondary | ICD-10-CM | POA: Diagnosis present

## 2021-01-16 DIAGNOSIS — E7800 Pure hypercholesterolemia, unspecified: Secondary | ICD-10-CM | POA: Diagnosis not present

## 2021-01-16 DIAGNOSIS — D271 Benign neoplasm of left ovary: Principal | ICD-10-CM | POA: Insufficient documentation

## 2021-01-16 DIAGNOSIS — D62 Acute posthemorrhagic anemia: Secondary | ICD-10-CM | POA: Diagnosis not present

## 2021-01-16 DIAGNOSIS — R102 Pelvic and perineal pain: Secondary | ICD-10-CM | POA: Diagnosis not present

## 2021-01-16 HISTORY — DX: Personal history of other benign neoplasm: Z86.018

## 2021-01-16 HISTORY — DX: Presence of spectacles and contact lenses: Z97.3

## 2021-01-16 HISTORY — PX: LAPAROSCOPY: SHX197

## 2021-01-16 HISTORY — DX: Pelvic and perineal pain: R10.2

## 2021-01-16 HISTORY — DX: Hypomagnesemia: E83.42

## 2021-01-16 HISTORY — DX: Unspecified ovarian cyst, unspecified side: N83.209

## 2021-01-16 HISTORY — PX: LAPAROSCOPIC SALPINGO OOPHERECTOMY: SHX5927

## 2021-01-16 HISTORY — DX: Pelvic and perineal pain unspecified side: R10.20

## 2021-01-16 HISTORY — DX: Localized edema: R60.0

## 2021-01-16 HISTORY — DX: Iron deficiency anemia, unspecified: D50.9

## 2021-01-16 HISTORY — DX: Chronic kidney disease, stage 3 unspecified: N18.30

## 2021-01-16 LAB — CBC
HCT: 40.4 % (ref 36.0–46.0)
Hemoglobin: 13 g/dL (ref 12.0–15.0)
MCH: 31.6 pg (ref 26.0–34.0)
MCHC: 32.2 g/dL (ref 30.0–36.0)
MCV: 98.1 fL (ref 80.0–100.0)
Platelets: 200 10*3/uL (ref 150–400)
RBC: 4.12 MIL/uL (ref 3.87–5.11)
RDW: 12.6 % (ref 11.5–15.5)
WBC: 6.9 10*3/uL (ref 4.0–10.5)
nRBC: 0 % (ref 0.0–0.2)

## 2021-01-16 LAB — POCT I-STAT, CHEM 8
BUN: 34 mg/dL — ABNORMAL HIGH (ref 8–23)
Calcium, Ion: 1.31 mmol/L (ref 1.15–1.40)
Chloride: 102 mmol/L (ref 98–111)
Creatinine, Ser: 1.2 mg/dL — ABNORMAL HIGH (ref 0.44–1.00)
Glucose, Bld: 101 mg/dL — ABNORMAL HIGH (ref 70–99)
HCT: 39 % (ref 36.0–46.0)
Hemoglobin: 13.3 g/dL (ref 12.0–15.0)
Potassium: 4.1 mmol/L (ref 3.5–5.1)
Sodium: 139 mmol/L (ref 135–145)
TCO2: 28 mmol/L (ref 22–32)

## 2021-01-16 LAB — TYPE AND SCREEN
ABO/RH(D): O NEG
Antibody Screen: NEGATIVE

## 2021-01-16 SURGERY — SALPINGO-OOPHORECTOMY, LAPAROSCOPIC
Anesthesia: General | Site: Abdomen | Laterality: Bilateral

## 2021-01-16 MED ORDER — ONDANSETRON HCL 4 MG/2ML IJ SOLN
INTRAMUSCULAR | Status: DC | PRN
  Administered 2021-01-16: 4 mg via INTRAVENOUS

## 2021-01-16 MED ORDER — PHENYLEPHRINE 40 MCG/ML (10ML) SYRINGE FOR IV PUSH (FOR BLOOD PRESSURE SUPPORT)
PREFILLED_SYRINGE | INTRAVENOUS | Status: AC
  Filled 2021-01-16: qty 10

## 2021-01-16 MED ORDER — SODIUM CHLORIDE 0.9 % IV SOLN
2.0000 g | INTRAVENOUS | Status: AC
  Administered 2021-01-16: 2 g via INTRAVENOUS

## 2021-01-16 MED ORDER — MENTHOL 3 MG MT LOZG
1.0000 | LOZENGE | OROMUCOSAL | Status: DC | PRN
  Administered 2021-01-16: 3 mg via ORAL
  Filled 2021-01-16: qty 9

## 2021-01-16 MED ORDER — ONDANSETRON HCL 4 MG/2ML IJ SOLN
INTRAMUSCULAR | Status: AC
  Filled 2021-01-16: qty 2

## 2021-01-16 MED ORDER — DEXAMETHASONE SODIUM PHOSPHATE 10 MG/ML IJ SOLN
INTRAMUSCULAR | Status: AC
  Filled 2021-01-16: qty 1

## 2021-01-16 MED ORDER — OXYCODONE HCL 5 MG PO TABS
5.0000 mg | ORAL_TABLET | Freq: Once | ORAL | Status: AC | PRN
  Administered 2021-01-16: 5 mg via ORAL

## 2021-01-16 MED ORDER — SODIUM CHLORIDE 0.9 % IR SOLN
Status: DC | PRN
  Administered 2021-01-16: 2000 mL

## 2021-01-16 MED ORDER — ACYCLOVIR 400 MG PO TABS: 400.0000 mg | ORAL_TABLET | Freq: Every day | ORAL | Status: DC

## 2021-01-16 MED ORDER — ACETAMINOPHEN 325 MG PO TABS
650.0000 mg | ORAL_TABLET | ORAL | Status: AC
  Administered 2021-01-16: 650 mg via ORAL

## 2021-01-16 MED ORDER — ONDANSETRON HCL 4 MG PO TABS: 4.0000 mg | ORAL_TABLET | Freq: Four times a day (QID) | ORAL | Status: DC | PRN

## 2021-01-16 MED ORDER — GLYCOPYRROLATE PF 0.2 MG/ML IJ SOSY
PREFILLED_SYRINGE | INTRAMUSCULAR | Status: DC | PRN
  Administered 2021-01-16: .2 mg via INTRAVENOUS

## 2021-01-16 MED ORDER — FENTANYL CITRATE (PF) 100 MCG/2ML IJ SOLN
25.0000 ug | INTRAMUSCULAR | Status: DC | PRN
  Administered 2021-01-16 (×2): 25 ug via INTRAVENOUS

## 2021-01-16 MED ORDER — PROPOFOL 10 MG/ML IV BOLUS
INTRAVENOUS | Status: AC
  Filled 2021-01-16: qty 20

## 2021-01-16 MED ORDER — FENTANYL CITRATE (PF) 250 MCG/5ML IJ SOLN
INTRAMUSCULAR | Status: AC
  Filled 2021-01-16: qty 5

## 2021-01-16 MED ORDER — CALCIUM CARBONATE-VITAMIN D 500-200 MG-UNIT PO TABS: 0.5000 | ORAL_TABLET | Freq: Every day | ORAL | Status: DC

## 2021-01-16 MED ORDER — ONDANSETRON HCL 4 MG/2ML IJ SOLN: 4.0000 mg | Freq: Four times a day (QID) | INTRAMUSCULAR | Status: DC | PRN

## 2021-01-16 MED ORDER — GABAPENTIN 100 MG PO CAPS
200.0000 mg | ORAL_CAPSULE | Freq: Two times a day (BID) | ORAL | Status: DC
  Administered 2021-01-16: 200 mg via ORAL
  Filled 2021-01-16: qty 2

## 2021-01-16 MED ORDER — POVIDONE-IODINE 10 % EX SWAB: 2.0000 "application " | Freq: Once | CUTANEOUS | Status: DC

## 2021-01-16 MED ORDER — PHENYLEPHRINE 40 MCG/ML (10ML) SYRINGE FOR IV PUSH (FOR BLOOD PRESSURE SUPPORT)
PREFILLED_SYRINGE | INTRAVENOUS | Status: DC | PRN
  Administered 2021-01-16 (×3): 80 ug via INTRAVENOUS
  Administered 2021-01-16: 40 ug via INTRAVENOUS
  Administered 2021-01-16: 120 ug via INTRAVENOUS

## 2021-01-16 MED ORDER — KETOROLAC TROMETHAMINE 30 MG/ML IJ SOLN
15.0000 mg | Freq: Four times a day (QID) | INTRAMUSCULAR | Status: DC
  Administered 2021-01-16 – 2021-01-17 (×2): 15 mg via INTRAVENOUS
  Filled 2021-01-16 (×2): qty 1

## 2021-01-16 MED ORDER — LIDOCAINE 2% (20 MG/ML) 5 ML SYRINGE
INTRAMUSCULAR | Status: AC
  Filled 2021-01-16: qty 5

## 2021-01-16 MED ORDER — GABAPENTIN 300 MG PO CAPS
ORAL_CAPSULE | ORAL | Status: AC
  Filled 2021-01-16: qty 1

## 2021-01-16 MED ORDER — SODIUM CHLORIDE 0.9 % IV SOLN: INTRAVENOUS | Status: DC

## 2021-01-16 MED ORDER — ONDANSETRON HCL 4 MG/2ML IJ SOLN
4.0000 mg | Freq: Once | INTRAMUSCULAR | Status: DC | PRN
Start: 2021-01-16 — End: 2021-01-16

## 2021-01-16 MED ORDER — PROPOFOL 10 MG/ML IV BOLUS
INTRAVENOUS | Status: DC | PRN
  Administered 2021-01-16: 120 mg via INTRAVENOUS

## 2021-01-16 MED ORDER — DEXAMETHASONE SODIUM PHOSPHATE 10 MG/ML IJ SOLN
INTRAMUSCULAR | Status: DC | PRN
  Administered 2021-01-16: 10 mg via INTRAVENOUS

## 2021-01-16 MED ORDER — FENTANYL CITRATE (PF) 100 MCG/2ML IJ SOLN
INTRAMUSCULAR | Status: DC | PRN
  Administered 2021-01-16: 100 ug via INTRAVENOUS

## 2021-01-16 MED ORDER — LIDOCAINE 2% (20 MG/ML) 5 ML SYRINGE
INTRAMUSCULAR | Status: DC | PRN
  Administered 2021-01-16: 100 mg via INTRAVENOUS

## 2021-01-16 MED ORDER — OXYCODONE HCL 5 MG PO TABS
ORAL_TABLET | ORAL | Status: AC
  Filled 2021-01-16: qty 1

## 2021-01-16 MED ORDER — ACETAMINOPHEN 325 MG PO TABS
ORAL_TABLET | ORAL | Status: AC
  Filled 2021-01-16: qty 2

## 2021-01-16 MED ORDER — ROCURONIUM BROMIDE 10 MG/ML (PF) SYRINGE
PREFILLED_SYRINGE | INTRAVENOUS | Status: DC | PRN
  Administered 2021-01-16: 60 mg via INTRAVENOUS

## 2021-01-16 MED ORDER — SODIUM CHLORIDE 0.9 % IV SOLN
INTRAVENOUS | Status: AC
  Filled 2021-01-16: qty 2

## 2021-01-16 MED ORDER — OXYCODONE HCL 5 MG/5ML PO SOLN: 5.0000 mg | Freq: Once | ORAL | Status: AC | PRN

## 2021-01-16 MED ORDER — BUPIVACAINE LIPOSOME 1.3 % IJ SUSP
INTRAMUSCULAR | Status: DC | PRN
  Administered 2021-01-16: 20 mL

## 2021-01-16 MED ORDER — HYDROMORPHONE HCL 1 MG/ML IJ SOLN: 0.2000 mg | INTRAMUSCULAR | Status: DC | PRN

## 2021-01-16 MED ORDER — BUPIVACAINE HCL (PF) 0.25 % IJ SOLN
INTRAMUSCULAR | Status: DC | PRN
  Administered 2021-01-16: 20 mL
  Administered 2021-01-16: 6 mL

## 2021-01-16 MED ORDER — ACETAMINOPHEN 325 MG PO TABS: 650.0000 mg | ORAL_TABLET | Freq: Four times a day (QID) | ORAL | Status: DC | PRN

## 2021-01-16 MED ORDER — FENTANYL CITRATE (PF) 100 MCG/2ML IJ SOLN
INTRAMUSCULAR | Status: AC
  Filled 2021-01-16: qty 2

## 2021-01-16 MED ORDER — GABAPENTIN 300 MG PO CAPS
300.0000 mg | ORAL_CAPSULE | ORAL | Status: AC
  Administered 2021-01-16: 300 mg via ORAL

## 2021-01-16 MED ORDER — ROCURONIUM BROMIDE 10 MG/ML (PF) SYRINGE
PREFILLED_SYRINGE | INTRAVENOUS | Status: AC
  Filled 2021-01-16: qty 10

## 2021-01-16 MED ORDER — SUGAMMADEX SODIUM 200 MG/2ML IV SOLN
INTRAVENOUS | Status: DC | PRN
  Administered 2021-01-16: 200 mg via INTRAVENOUS

## 2021-01-16 MED ORDER — HYDROCODONE-ACETAMINOPHEN 5-325 MG PO TABS
1.0000 | ORAL_TABLET | ORAL | Status: DC | PRN
  Administered 2021-01-16: 2 via ORAL
  Filled 2021-01-16: qty 2

## 2021-01-16 MED ORDER — DEXTROSE IN LACTATED RINGERS 5 % IV SOLN: INTRAVENOUS | Status: DC

## 2021-01-16 MED ORDER — DROPERIDOL 2.5 MG/ML IJ SOLN: 0.6250 mg | Freq: Once | INTRAMUSCULAR | Status: DC | PRN

## 2021-01-16 MED ORDER — ZOLPIDEM TARTRATE 5 MG PO TABS: 5.0000 mg | ORAL_TABLET | Freq: Every evening | ORAL | Status: DC | PRN

## 2021-01-16 SURGICAL SUPPLY — 62 items
ADH SKN CLS APL DERMABOND .7 (GAUZE/BANDAGES/DRESSINGS) ×1
BAG RETRIEVAL 10 (BASKET)
BLADE CLIPPER SENSICLIP SURGIC (BLADE) IMPLANT
BLADE SURG 10 STRL SS (BLADE) ×1 IMPLANT
CATH ROBINSON RED A/P 16FR (CATHETERS) ×1 IMPLANT
CELLS DAT CNTRL 66122 CELL SVR (MISCELLANEOUS) ×1 IMPLANT
COVER MAYO STAND STRL (DRAPES) ×2 IMPLANT
COVER WAND RF STERILE (DRAPES) ×2 IMPLANT
DERMABOND ADVANCED (GAUZE/BANDAGES/DRESSINGS) ×1
DERMABOND ADVANCED .7 DNX12 (GAUZE/BANDAGES/DRESSINGS) IMPLANT
DRSG COVADERM PLUS 2X2 (GAUZE/BANDAGES/DRESSINGS) IMPLANT
DRSG OPSITE POSTOP 3X4 (GAUZE/BANDAGES/DRESSINGS) IMPLANT
DRSG OPSITE POSTOP 4X10 (GAUZE/BANDAGES/DRESSINGS) ×1 IMPLANT
DRSG PAD ABDOMINAL 8X10 ST (GAUZE/BANDAGES/DRESSINGS) ×1 IMPLANT
DURAPREP 26ML APPLICATOR (WOUND CARE) ×2 IMPLANT
ELECT REM PT RETURN 9FT ADLT (ELECTROSURGICAL) ×2
ELECTRODE REM PT RTRN 9FT ADLT (ELECTROSURGICAL) ×1 IMPLANT
GAUZE 4X4 16PLY RFD (DISPOSABLE) ×1 IMPLANT
GAUZE SPONGE 4X4 12PLY STRL LF (GAUZE/BANDAGES/DRESSINGS) ×1 IMPLANT
GLOVE SURG ENC MOIS LTX SZ7 (GLOVE) ×4 IMPLANT
GOWN STRL REUS W/TWL LRG LVL3 (GOWN DISPOSABLE) ×2 IMPLANT
HOLDER FOLEY CATH W/STRAP (MISCELLANEOUS) ×1 IMPLANT
KIT TURNOVER CYSTO (KITS) ×2 IMPLANT
NDL SPNL 18GX3.5 QUINCKE PK (NEEDLE) IMPLANT
NEEDLE INSUFFLATION 120MM (ENDOMECHANICALS) ×2 IMPLANT
NEEDLE SPNL 18GX3.5 QUINCKE PK (NEEDLE) ×2 IMPLANT
NS IRRIG 1000ML POUR BTL (IV SOLUTION) ×2 IMPLANT
NS IRRIG 500ML POUR BTL (IV SOLUTION) ×2 IMPLANT
PACK LAPAROSCOPY BASIN (CUSTOM PROCEDURE TRAY) ×2 IMPLANT
PACK TRENDGUARD 450 HYBRID PRO (MISCELLANEOUS) IMPLANT
PAD OB MATERNITY 4.3X12.25 (PERSONAL CARE ITEMS) ×2 IMPLANT
PAD PREP 24X48 CUFFED NSTRL (MISCELLANEOUS) ×2 IMPLANT
PENCIL SMOKE EVACUATOR (MISCELLANEOUS) ×1 IMPLANT
RETRACTOR WND ALEXIS 18 MED (MISCELLANEOUS) IMPLANT
RTRCTR WOUND ALEXIS 18CM MED (MISCELLANEOUS) ×2
SCISSORS LAP 5X35 DISP (ENDOMECHANICALS) IMPLANT
SEALER TISSUE G2 CVD JAW 45CM (ENDOMECHANICALS) IMPLANT
SET SUCTION IRRIG HYDROSURG (IRRIGATION / IRRIGATOR) IMPLANT
SET TUBE SMOKE EVAC HIGH FLOW (TUBING) ×2 IMPLANT
SHEARS HARMONIC ACE PLUS 45CM (MISCELLANEOUS) IMPLANT
SPONGE LAP 18X18 RF (DISPOSABLE) ×3 IMPLANT
SUT MNCRL AB 4-0 PS2 18 (SUTURE) ×2 IMPLANT
SUT PDS AB 1 CT1 27 (SUTURE) ×2 IMPLANT
SUT VIC AB 0 CT1 18XCR BRD8 (SUTURE) IMPLANT
SUT VIC AB 0 CT1 27 (SUTURE) ×4
SUT VIC AB 0 CT1 27XBRD ANBCTR (SUTURE) IMPLANT
SUT VIC AB 0 CT1 8-18 (SUTURE) ×2
SUT VICRYL 0 TIES 12 18 (SUTURE) ×1 IMPLANT
SUT VICRYL 0 UR6 27IN ABS (SUTURE) IMPLANT
SYR 10ML LL (SYRINGE) ×2 IMPLANT
SYS BAG RETRIEVAL 10MM (BASKET)
SYSTEM BAG RETRIEVAL 10MM (BASKET) IMPLANT
TAPE CLOTH SURG 4X10 WHT LF (GAUZE/BANDAGES/DRESSINGS) ×1 IMPLANT
TOWEL OR 17X26 10 PK STRL BLUE (TOWEL DISPOSABLE) ×3 IMPLANT
TRAY FOLEY W/BAG SLVR 14FR LF (SET/KITS/TRAYS/PACK) ×1 IMPLANT
TRENDGUARD 450 HYBRID PRO PACK (MISCELLANEOUS) ×2
TROCAR OPTI TIP 5M 100M (ENDOMECHANICALS) ×2 IMPLANT
TROCAR XCEL BLUNT TIP 100MML (ENDOMECHANICALS) IMPLANT
TROCAR XCEL DIL TIP R 11M (ENDOMECHANICALS) ×2 IMPLANT
TUBE CONNECTING 12X1/4 (SUCTIONS) ×1 IMPLANT
WARMER LAPAROSCOPE (MISCELLANEOUS) ×2 IMPLANT
YANKAUER SUCT BULB TIP NO VENT (SUCTIONS) ×1 IMPLANT

## 2021-01-16 NOTE — Op Note (Signed)
Preop diagnosis: Pelvic pain, left ovarian cyst  Postoperative diagnosis: Same plus bilateral adnexal adhesions  Surgeon: Matthew Saras  Assistant: Milta Deiters  EBL: Less than 50 cc  Specimen removed bilateral tubes and ovaries  Procedure findings: Tacky patient taken the operating room after adequate level of general anesthesia was obtained the patient's legs in stirrups the abdomen prepped and draped, she had a prior hysterectomy, Foley catheter was positioned and appropriate timeouts were taken.  Attention directed to the umbilical area, the subumbilical area was infiltrated with quarter percent Marcaine plain a small incision was made and the varies needle was introduced difficulty.  Its intra-abdominal position was verified by pressure water testing.  After 2 and half liters pneumoperitoneum was then created, laparoscopic trocar and sleeve were then introduced at difficulty.  There is no evidence of any bleeding or trauma.  Patient placed in Trendelenburg, 3 fingerbreadths above the symphysis in the midline a 5 mm trocar was inserted under direct visualization.  Good visualization of the pelvis, the right ovary was atrophic but was adherent to the right pelvic sidewall, there was a 2-1/2 to 3cm clear cyst on the left but that ovary likewise was stuck to the pelvic sidewall on the side also.  Decision made to proceed with laparotomy, instruments were removed, gas allowed to partially escape and the laparoscopic incisions were closed.  Legs were lowered, Pfannenstiel incision was made through the old scar this was carried down to the fascia which was incised and extended transversely.  Rectus muscle divided in the midline, peritoneum entered superiorly without incident and extended vertically.  An Alexis retractor was then positioned.  Bowels packed superiorly elevated with the patient in Trendelenburg.  Starting on the left, the left round ligament was identified clamped divided suture-ligated and the  retroperitoneal space on that side was developed with blunt dissection.  The cyst was adherent but with gentle dissection was able to dissected free without rupturing the cyst.  The course of the ureter was well below, left IP ligament was isolated clamped divided first free tie followed by suture ligature 0 Vicryl.  Remaining pedicle was clamped divided and suture-ligated again assuring that the course of the ureter was well below and was easily tracked in this patient.  On the right, round ligament clamped divided suture-ligated retroperitoneal space on that side was developed, once again the course of the ureter was well below the operative site, right IP ligament was isolated clamped divided free tie followed by suture ligature 0 Vicryl.  With the ureter under constant surveillance, sharp and blunt dissection was used to free the ovary from the sidewall, the ureter was well below, the 1 remaining pedicle was clamped divided and tied with 0 Vicryl.  Pelvis was then irrigated with saline operative sites on Inspected and noted to be hemostatic prior to closure sponge, needle, instrument counts reported as correct x2.  Team closed with a 2-0 Vicryl running suture same on the rectus muscles in the midline 0 PDS from laterally to midline made beside a closed fashion.  Diluted Exparel solution solution was then injected subfascially and subcutaneously for postoperative analgesia.  Subcutaneous tissue was undermined to allow for better closure this was irrigated and rendered hemostatic with the Bovie.  3-0 Monocryl subcuticular suture with a pressure dressing applied clear urine noted at the end of the case she tolerated this well went to recovery room in good condition.  Dictated with Dragon [employer] One  Nira Retort MD

## 2021-01-16 NOTE — Anesthesia Postprocedure Evaluation (Signed)
Anesthesia Post Note  Patient: MARVIS BAKKEN  Procedure(s) Performed: DIAGNOSTIC LAPAROSCOPY (Bilateral Abdomen) EXPLORATORY LAPAROTOMY BILATERAL SALPINGO OOPHORECTOMY (Bilateral Abdomen)     Patient location during evaluation: PACU Anesthesia Type: General Level of consciousness: awake and alert Pain management: pain level controlled Vital Signs Assessment: post-procedure vital signs reviewed and stable Respiratory status: spontaneous breathing, nonlabored ventilation and respiratory function stable Cardiovascular status: blood pressure returned to baseline and stable Postop Assessment: no apparent nausea or vomiting Anesthetic complications: no   No complications documented.  Last Vitals:  Vitals:   01/16/21 1035 01/16/21 1143  BP: 135/63 129/63  Pulse: 68 67  Resp: 16 16  Temp: (!) 36.4 C 36.5 C  SpO2: 92% 92%    Last Pain:  Vitals:   01/16/21 1055  TempSrc:   PainSc: 2                  Merlinda Frederick

## 2021-01-16 NOTE — Anesthesia Procedure Notes (Signed)
Procedure Name: Intubation Date/Time: 01/16/2021 7:30 AM Performed by: Rogers Blocker, CRNA Pre-anesthesia Checklist: Patient identified, Emergency Drugs available, Suction available and Patient being monitored Patient Re-evaluated:Patient Re-evaluated prior to induction Oxygen Delivery Method: Circle System Utilized Preoxygenation: Pre-oxygenation with 100% oxygen Induction Type: IV induction Ventilation: Mask ventilation without difficulty Laryngoscope Size: Mac and 3 Grade View: Grade I Tube type: Oral Number of attempts: 1 Airway Equipment and Method: Stylet Placement Confirmation: ETT inserted through vocal cords under direct vision,  positive ETCO2 and breath sounds checked- equal and bilateral Secured at: 22 cm Tube secured with: Tape Dental Injury: Teeth and Oropharynx as per pre-operative assessment  Comments: Soft bite block after intubation.

## 2021-01-16 NOTE — Transfer of Care (Signed)
Immediate Anesthesia Transfer of Care Note  Patient: April Beck  Procedure(s) Performed: DIAGNOSTIC LAPAROSCOPY (Bilateral Abdomen) EXPLORATORY LAPAROTOMY BILATERAL SALPINGO OOPHORECTOMY (Bilateral Abdomen)  Patient Location: PACU  Anesthesia Type:General  Level of Consciousness: awake, alert , oriented and patient cooperative  Airway & Oxygen Therapy: Patient Spontanous Breathing and Patient connected to nasal cannula oxygen  Post-op Assessment: Report given to RN and Post -op Vital signs reviewed and stable  Post vital signs: Reviewed and stable  Last Vitals:  Vitals Value Taken Time  BP 150/65 01/16/21 0900  Temp    Pulse 73 01/16/21 0903  Resp 14 01/16/21 0903  SpO2 98 % 01/16/21 0903  Vitals shown include unvalidated device data.  Last Pain:  Vitals:   01/16/21 0610  TempSrc: Oral  PainSc: 0-No pain         Complications: No complications documented.

## 2021-01-16 NOTE — Progress Notes (Signed)
The patient was re-examined with no change in status 

## 2021-01-17 DIAGNOSIS — D271 Benign neoplasm of left ovary: Secondary | ICD-10-CM | POA: Diagnosis not present

## 2021-01-17 LAB — CBC
HCT: 31.7 % — ABNORMAL LOW (ref 36.0–46.0)
Hemoglobin: 10.2 g/dL — ABNORMAL LOW (ref 12.0–15.0)
MCH: 31.9 pg (ref 26.0–34.0)
MCHC: 32.2 g/dL (ref 30.0–36.0)
MCV: 99.1 fL (ref 80.0–100.0)
Platelets: 174 10*3/uL (ref 150–400)
RBC: 3.2 MIL/uL — ABNORMAL LOW (ref 3.87–5.11)
RDW: 12.5 % (ref 11.5–15.5)
WBC: 11.4 10*3/uL — ABNORMAL HIGH (ref 4.0–10.5)
nRBC: 0 % (ref 0.0–0.2)

## 2021-01-17 LAB — SURGICAL PATHOLOGY

## 2021-01-17 MED ORDER — HYDROCODONE-ACETAMINOPHEN 5-325 MG PO TABS: 1.0000 | ORAL_TABLET | ORAL | 0 refills | Status: DC | PRN

## 2021-01-17 NOTE — Progress Notes (Signed)
Discharge instructions discussed with patient and family, verbalized agreement and understanding 

## 2021-01-17 NOTE — Discharge Summary (Signed)
Physician Discharge Summary  Patient ID: April Beck MRN: [id number] DOB/AGE: [birth year] 75 y.o.  Admit date: 01/16/2021 Discharge date: 01/17/2021  Admission Diagnoses:pelvic pain, L ovarian cyst  Discharge Diagnoses: ovarian cyst   Active Problems:   Ovarian cyst   Discharged Condition: good  Hospital Course: adm for DL poss EL, findings at surgery showed L ov cyst + bilat adnexal adhesions>>EL w/ BSO, on POD 1 was afeb, tol PO, ambulating and ready for D/c  Consults: None  Significant Diagnostic Studies: labs:  Results for orders placed or performed during the hospital encounter of 01/16/21 (from the past 48 hour(s))  Type and screen [facility]     Status: None   Collection Time: 01/16/21  6:46 AM  Result Value Ref Range   ABO/RH(D) O NEG    Antibody Screen NEG    Sample Expiration      01/19/2021,2359 Performed at [facility] [address] [place], [place] [address]   CBC     Status: None   Collection Time: 01/16/21  6:46 AM  Result Value Ref Range   WBC 6.9 4.0 - 10.5 K/uL   RBC 4.12 3.87 - 5.11 MIL/uL   Hemoglobin 13.0 12.0 - 15.0 g/dL   HCT 40.4 36.0 - 46.0 %   MCV 98.1 80.0 - 100.0 fL   MCH 31.6 26.0 - 34.0 pg   MCHC 32.2 30.0 - 36.0 g/dL   RDW 12.6 11.5 - 15.5 %   Platelets 200 150 - 400 K/uL   nRBC 0.0 0.0 - 0.2 %    Comment: Performed at [facility], [place] [address], [place], [place] [address]  Ginger Carne [address]     Status: Abnormal   Collection Time: 01/16/21  6:52 AM  Result Value Ref Range   Sodium 139 135 - 145 mmol/L   Potassium 4.1 3.5 - 5.1 mmol/L   Chloride 102 98 - 111 mmol/L   BUN 34 (H) 8 - 23 mg/dL   Creatinine, Ser 1.20 (H) 0.44 - 1.00 mg/dL   Glucose, Bld 101 (H) 70 - 99 mg/dL    Comment: Glucose reference range applies only to samples taken after fasting for at least 8 hours.   Calcium, Ion 1.31 1.15 - 1.40 mmol/L   TCO2 28 22 - 32 mmol/L   Hemoglobin 13.3 12.0 - 15.0 g/dL    HCT 39.0 36.0 - 46.0 %  CBC     Status: Abnormal   Collection Time: 01/17/21  4:16 AM  Result Value Ref Range   WBC 11.4 (H) 4.0 - 10.5 K/uL   RBC 3.20 (L) 3.87 - 5.11 MIL/uL   Hemoglobin 10.2 (L) 12.0 - 15.0 g/dL   HCT 31.7 (L) 36.0 - 46.0 %   MCV 99.1 80.0 - 100.0 fL   MCH 31.9 26.0 - 34.0 pg   MCHC 32.2 30.0 - 36.0 g/dL   RDW 12.5 11.5 - 15.5 %   Platelets 174 150 - 400 K/uL   nRBC 0.0 0.0 - 0.2 %    Comment: Performed at [facility], [place] [address] [place], [place] [address]    Treatments: surgery: DL>>EL w/ BSO  Discharge Exam: Blood pressure 113/62, pulse 62, temperature 97.8 F (36.6 C), temperature source Oral, resp. rate 18, height 5' 6.5" (1.689 m), weight 78.7 kg, SpO2 94 %. General appearance: alert and cooperative Incision/Wound:dressing C/D  Disposition: Discharge disposition: 01-Home or Self Care  Follow-up Information    Molli Posey, MD. Schedule an appointment as soon as possible for a visit in 2 week(s).   Specialty: Obstetrics and Gynecology Contact information: [place] [address] [phone]               Signed: Margarette Asal 01/17/2021, 9:33 AM

## 2021-01-18 ENCOUNTER — Encounter (HOSPITAL_BASED_OUTPATIENT_CLINIC_OR_DEPARTMENT_OTHER): Payer: Self-pay | Admitting: Obstetrics and Gynecology

## 2021-02-02 ENCOUNTER — Telehealth (INDEPENDENT_AMBULATORY_CARE_PROVIDER_SITE_OTHER): Payer: Self-pay | Admitting: Family

## 2021-02-02 NOTE — Telephone Encounter (Signed)
It appears we increased her glipizide from 5mg  BID to 10mg  BID in February. She has been prescribed 10mg  tablets since Feb- should be taking 1 tablet twice a day. Unless she still had 5mg  tabs leftover and was taking 2 twice a day? Verify the dosing she is actually taking.     She is also due for her 3 month follow up.

## 2021-02-02 NOTE — Telephone Encounter (Addendum)
Called and asked for the correct RX to be called in for Glucotrol. Pt stated that she takes 4 pills a day and not two, thus the current RX she picked up would leave her short. Her call back number is: [phone]

## 2021-02-02 NOTE — Telephone Encounter (Signed)
Left detailed voicemail to call office to discuss medication dosing.

## 2021-02-06 ENCOUNTER — Telehealth (INDEPENDENT_AMBULATORY_CARE_PROVIDER_SITE_OTHER): Payer: Self-pay | Admitting: Family

## 2021-02-06 DIAGNOSIS — E1165 Type 2 diabetes mellitus with hyperglycemia: Secondary | ICD-10-CM

## 2021-02-06 MED ORDER — GLIPIZIDE 10 MG PO TABS
20.0000 mg | ORAL_TABLET | Freq: Two times a day (BID) | ORAL | 0 refills | Status: DC
Start: 2021-02-06 — End: 2021-05-03

## 2021-02-06 NOTE — Addendum Note (Signed)
Addended by: Myer Haff on: 02/06/2021 04:22 PM     Modules accepted: Orders

## 2021-02-06 NOTE — Telephone Encounter (Signed)
Pt states she takes 4 tabs twice a day she needs her medication to be modified for her to have enough medication to take

## 2021-02-06 NOTE — Telephone Encounter (Signed)
Taking 20mg  BID. Send in refill to [facility] in [place].

## 2021-02-06 NOTE — Telephone Encounter (Signed)
Sent. Will will refill again until she has f/u appt and labs.

## 2021-02-09 ENCOUNTER — Ambulatory Visit (INDEPENDENT_AMBULATORY_CARE_PROVIDER_SITE_OTHER): Payer: 59 | Admitting: Family

## 2021-02-15 ENCOUNTER — Ambulatory Visit (INDEPENDENT_AMBULATORY_CARE_PROVIDER_SITE_OTHER): Payer: Medicare Other | Admitting: Orthopedic Surgery

## 2021-02-15 ENCOUNTER — Encounter: Payer: Self-pay | Admitting: Orthopedic Surgery

## 2021-02-15 ENCOUNTER — Telehealth: Payer: Self-pay | Admitting: Orthopedic Surgery

## 2021-02-15 ENCOUNTER — Other Ambulatory Visit: Payer: Self-pay

## 2021-02-15 VITALS — BP 129/80 | HR 80 | Ht 66.5 in | Wt 174.6 lb

## 2021-02-15 DIAGNOSIS — S72001D Fracture of unspecified part of neck of right femur, subsequent encounter for closed fracture with routine healing: Secondary | ICD-10-CM

## 2021-02-15 MED ORDER — TRAMADOL-ACETAMINOPHEN 37.5-325 MG PO TABS: 1.0000 | ORAL_TABLET | Freq: Four times a day (QID) | ORAL | 0 refills | Status: AC | PRN

## 2021-02-15 NOTE — Telephone Encounter (Signed)
She wanted it to pharmacy in [place], but we selected incorrect pharmacy. She said she will pick up tomorrow at [facility] [place]. I offered to have him resend but she said its okay she will get it tomorrow.

## 2021-02-15 NOTE — Progress Notes (Signed)
POST OP   Chief Complaint  Patient presents with   Post op right hip DOS 11/13/20   BIPOLAR RT HIP   April Beck is doing well but she is limping and she is concerned.  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    Dispense:  20 tablet    Refill:  0

## 2021-02-15 NOTE — Telephone Encounter (Signed)
Patient called via voice message relaying that the non-narcotic medication prescribed by Dr Aline Brochure is not at [facility], [place], as of 1:15pm; please advise. (I returned call to acknowledge the message; reached voice mail, left message that I would be forwarding the information to Dr Ruthe Mannan clinic staff.

## 2021-02-16 ENCOUNTER — Encounter (INDEPENDENT_AMBULATORY_CARE_PROVIDER_SITE_OTHER): Payer: Self-pay | Admitting: Family

## 2021-02-16 ENCOUNTER — Ambulatory Visit (INDEPENDENT_AMBULATORY_CARE_PROVIDER_SITE_OTHER): Payer: 59 | Admitting: Family

## 2021-02-16 VITALS — BP 155/67 | HR 80 | Temp 97.8°F | Ht 60.83 in | Wt 156.0 lb

## 2021-02-16 DIAGNOSIS — Z0000 Encounter for general adult medical examination without abnormal findings: Secondary | ICD-10-CM

## 2021-02-16 DIAGNOSIS — E782 Mixed hyperlipidemia: Secondary | ICD-10-CM

## 2021-02-16 DIAGNOSIS — E559 Vitamin D deficiency, unspecified: Secondary | ICD-10-CM

## 2021-02-16 DIAGNOSIS — I10 Essential (primary) hypertension: Secondary | ICD-10-CM

## 2021-02-16 DIAGNOSIS — Z780 Asymptomatic menopausal state: Secondary | ICD-10-CM

## 2021-02-16 DIAGNOSIS — E1165 Type 2 diabetes mellitus with hyperglycemia: Secondary | ICD-10-CM

## 2021-02-16 DIAGNOSIS — E611 Iron deficiency: Secondary | ICD-10-CM

## 2021-02-16 MED ORDER — VALSARTAN-HYDROCHLOROTHIAZIDE 160-12.5 MG PO TABS
1.0000 | ORAL_TABLET | Freq: Every day | ORAL | 3 refills | Status: DC
Start: 2021-02-16 — End: 2022-01-01

## 2021-02-16 NOTE — Progress Notes (Signed)
Helen [name] is a 75 y.o. female who presents today for the following Medicare Wellness Visit:  []  Initial Preventive Physical Exam (IPPE) - "Welcome to Medicare" preventive visit (Vision Screening required)   []  Annual Wellness Visit - Initial  [x]  Annual Wellness Visit - Subsequent    Est with breast surgery, cardiology    DMII: States sugars 130-150, improving with exercise. States diet is portion controlled. Checking sugar twice daily. Compliant with meds. No symptoms uncontrolled diabetes, no symptoms neuropathy.     Health Risk Assessment:   During the past month, how would you rate your general health?:  Good  Which of the following tasks can you do without assistance - drive or take the bus alone; shop for groceries or clothes; prepare your own meals; do your own housework/laundry; handle your own finances/pay bills; eat, bathe or get around your home?: Prepare your own meals, Handle your own finances/pay bills, Shop for groceries or clothes, Eat, bathe, dress or get around your home  Which of the following problems have you been bothered by in the past month - dizzy when standing up; problems using the phone; feeling tired or fatigued; moderate or severe body pain?: None of these  Do you exercise for about 20 minutes 3 or more days per week?:Yes  During the past month was someone available to help if you needed and wanted help?  For example, if you felt nervous, lonely, got sick and had to stay in bed, needed someone to talk to, needed help with daily chores or needed help just taking care of yourself.: Yes  Do you always wear a seat belt?: Yes  Do you have any trouble taking medications the way you have been told to take them?: No  Have you been given any information that can help you with keeping track of your medications?: Yes  Do you have trouble paying for your medications?: No  Have you been given any information that can help you with hazards in  your house, such as scatter rugs, furniture, etc?: Yes  Do you feel unsteady when standing or walking?: Yes (sometimes)  Do you worry about falling?: Yes  Have you fallen two or more times in the past year?: Yes  Did you suffer any injuries from your falls in the past year?: Yes     Care Team:   Patient Care Team:  Myer Haff, FNP as PCP - General (Family Nurse Practitioner)  Blanchard Mane, MD as Consulting Physician (Medical Oncology)  Dollene Cleveland, MD  Sharrell Ku, MD as Consulting Physician (Allergy and Immunology)  Nilsa Nutting as Technician  Vlacancich, Ruben Reason, DO as Consulting Physician (Cardiology)      Hospitalizations:   Hospitalization within past year: [x]  No  []  Yes     Diagnosis:      Screenings:   Ambulatory Screenings 12/16/2019 02/10/2020 02/16/2021   Falls Risk: De [place] more than 2 times in past year - Y Y   Falls Risk: Suffer any injuries? - Y Y   Depression: PHQ2 Total Score 0 - -   Depression: PHQ9 Total Score 0 - -        Substance Use Disorder Screen:  In the past year, how often have you used the following?  1) Alcohol (For men, 5 or more drinks a day. For women, 4 or more drinks a day)  [x]  Never []   Once or Twice []  Monthly []  Weekly []  Daily or Almost Daily  2) Tobacco Products  [x]  Never []  Once or Twice []  Monthly []  Weekly []  Daily or Almost Daily  3) Prescription Drugs for Non-Medical Reasons  [x]  Never []  Once or Twice []  Monthly []  Weekly []  Daily or Almost Daily  4) Illegal Drugs  [x]  Never []  Once or Twice []  Monthly []  Weekly []  Daily or Almost Daily             Functional Ability/Level of Safety:   Falls Risk/Home Safety Assessment:  ( see HRA and Screenings sections for additional assessment)  Home Safety: [x]  Stair handrails  []  Skid-resistant rugs/remove throw rugs   [x]  Grab bars  [x]  Clear pathways between rooms  [x]  Proper lighting stairs/ bathrooms/bedrooms  Get Up and Go (optional):  [x]   <20 secs  []   >20 secs    []   High risk for falls - Home  Safety/Falls Risk Precautions reviewed with pt/family    Hearing Assessment:  Concerns for hearing loss: []  Yes  [x]   No  Hearing aids:   []   Right  [x]   Left  []   Bilateral   []   None     has had hearing test but not enough for hearing aid    Whisper Test (optional):  []  Normal  []   Slightly decreased  []   Significantly decreased    Exercise:  Frequency:  []   No formal exercise  []   1-2x/wk  [x]   3-4x/wk  []   >4x/wk  Duration:  []   15-30 mins/day  []   30-45 mins/day  [x]   45+ mins/day  Intensity:  []   Light  [x]   Moderate  []   Heavy        Activities of Daily Living:   ADL's Independent Minimal  Assistance Moderate  Assistance Total   Assistance   Bathing [x]  []  []  []    Dressing [x]  []  []  []    Mobility   [x]  []  []  []    Transfer [x]  []  []  []    Eating [x]  []  []  []    Toileting [x]  []  []  []      IADL's Independent Minimal  Assistance Moderate  Assistance Total   Assistance   Phone [x]  []  []  []    Housekeeping [x]  []  []  []    Laundry [x]  []  []  []    Transportation [x]  []  []  []    Medications [x]  []  []  []    Finances [x]  []  []  []       ADL assistance: [x]  No assistance needed  []  Spouse  []  Sibling  []  Son   []  Daughter []  Children  []  Home Health Aide []  Other:       Advance Care Planning:   Discussion of Advance Directives:   []  Advance Directive in chart  [x]  Advance Directive not in chart - requested to provide []  No Advance Directive.  Form Provided  []  No Advance Directive.  Pt declines. []  Not addressed today  []  Other:     Exam:   BP 155/67 (BP Site: Left arm, Patient Position: Sitting, Cuff Size: Medium)   Pulse 80   Temp 97.8 F (36.6 C) (Temporal)   Ht 1.545 m (5' 0.83")   Wt 70.8 kg (156 lb)   SpO2 98%   BMI 29.64 kg/m      Physical Exam     General Examination:   GENERAL APPEARANCE: alert, in no acute distress, well developed, well nourished   ORAL CAVITY: normal oropharynx, normal lips, mucosa moist.  NECK/THYROID: neck supple, no carotid bruit, no neck mass palpated, no jugular venous distention, no  thyromegaly.  HEART: S1, S2 normal, no murmurs, rubs, gallops, regular rate and rhythm.   LUNGS: normal effort / no distress, normal breath sounds, clear to auscultation bilaterally, no wheezes, rales, rhonchi.   EXTREMITIES: No LE edema bilaterally.      Evaluation of Cognitive Function:   Mood/affect: [x]  Appropriate  []   Other:   Appearance: [x]  Neatly groomed  [x]  Adequately nourished  []  Other:  Family member/caregiver input: []  Present - no concerns  [x]   Not present in room  []  Present - concerns:    Cognitive Assessment:  Mini-Cog Result (three word registration- banana, sunrise, chair / clock drawing):   []   > 3 points - negative screen for dementia   [x]  3 recalled words - negative screen for dementia   []  1-2 recalled words and normal clock draw - negative for cognitive impairment   []  1-2 recalled words and abnormal clock draw - positive for cognitive impairment   []  0 recalled words - positive for cognitive impairment         Assessment/Plan:   1. Medicare annual wellness visit, subsequent    2. Uncontrolled type 2 diabetes mellitus with hyperglycemia  - CBC and differential  - Comprehensive metabolic panel  - Hemoglobin A1C  - Urine Microalbumin Random    3. Primary hypertension  - TSH, Abn Reflex to Free T4, Serum  - valsartan-hydroCHLOROthiazide (DIOVAN-HCT) 160-12.5 MG per tablet; Take 1 tablet by mouth daily  Dispense: 90 tablet; Refill: 3    4. Mixed hyperlipidemia  - Lipid panel    5. Vitamin D insufficiency  - Vitamin D,25 OH, Total    6. Iron deficiency  - IRON PROFILE  - Ferritin    7. Asymptomatic menopause  - Dxa Bone Density Axial Skeleton; Future    Education/Counseling:  During the course of the visit, the patient was educated and counseled about appropriate screening and preventive services including:   Influenza vaccine  Shingrix vaccine  COVID vaccine  Screening mammography  Bone densitometry screening  Colorectal cancer screening  Physical activity/exercise counseling  Heart healthy  diet counseling  Falls risk prevention and home safety      Falls Risk/Home Safety:  The following falls risk/home safety measures were discussed with the patient:  [x]  Home Environment - 1) clear floors, walkways, and stairs of items 2) Remove throw rugs or use non-skid rugs 3) Install grab bars in bathroom 4) Use of handrails with stairs 5) Proper lighting of hallways, bathroom, stairs, bedroom  [x]  Physical Activity - age appropriate activity/exercise 150 mins per week.  [x]  Nutrition - adequate hydration, limit soda, coffee, alcohol   [x]  Vision - yearly eye exam, keep glasses clean  [x]  Medications - side effect education    Return in about 6 months (around 08/18/2021) for f/u AND as needed.    Reviewed med use and side effects. Reviewed s/s that would warrant further and/ or immediate medical attention. Pt in agreement with plan and all questions answered.         Myer Haff, FNP    02/17/2021     The following sections were reviewed this encounter by the provider:   Tobacco  Allergies  Meds  Problems  Med Hx  Surg Hx  Fam Hx  Soc Hx             History:   Patient Active Problem List  Diagnosis    Diabetes type 2, uncontrolled    Hypertension    Hyperlipidemia    H/O total hysterectomy with removal of both tubes and ovaries    Syncope    Acute viral syndrome    Heart block AV first degree    Chest pain, unspecified type    RBBB (right bundle branch block)    Palpitations    Pure hypercholesterolemia, unspecified    Nonspecific abnormal electrocardiogram (ECG) (EKG)    Type 2 diabetes mellitus without complication, without long-term current use of insulin      Past Medical History:   Diagnosis Date    Anemia     iron    currently on medication      Arthritis     general body      Arthritis     Generalized    Asthma     Asthma without status asthmaticus     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    Bilateral cataracts     Breast cancer     Breast lump     Cancer     breast  cancer    Carcinoma     Carcinoma of the bladder    Chicken pox     Childhood illness    Conduction Disorder     RBBB    Diabetes mellitus type II     Non-Insulin dependent    Echocardiogram 10/2013    Gastroesophageal reflux disease     Hemorrhoids without complication     Holter monitor 02/2014    Hyperlipidemia     Hypertensive disorder     Malignant neoplasm of breast     Measles     Childhood illness    Mumps     Childhood illness    Myocardial perfusion scan 11/2013, 12/2013    MPI single Isotope excrise; MPI dual Isotope Lexiscan    RBBB     Syncope      Past Surgical History:   Procedure Laterality Date    ARTHROSCOPY, KNEE Left 04/21/2015    Procedure: ARTHROSCOPY, KNEE PARTIAL MEDIAL AND LATERAL MENISECTOMY LEFT KNEE;  Surgeon: Bess Kinds, MD;  Location: [facility];  Service: Orthopedics;  Laterality: Left;  partial medial menisectomy    BREAST BIOPSY      HYSTERECTOMY      total    TUMOR REMOVAL Left     neck  benign      Allergies   Allergen Reactions    Aspirin      Break out in sweat and dizziness     Peppers     Pollen Extract     Latex Itching and Rash      Outpatient Medications Marked as Taking for the 02/16/21 encounter (Office Visit) with Myer Haff, FNP   Medication Sig Dispense Refill    albuterol (PROVENTIL) (2.5 MG/3ML) 0.083% nebulizer solution Take 2.5 mg by nebulization every 6 (six) hours as needed.      amLODIPine (NORVASC) 10 MG tablet Take 1 tablet (10 mg total) by mouth daily 90 tablet 3    atorvastatin (LIPITOR) 40 MG tablet Take 1 tablet (40 mg total) by mouth daily 90 tablet 1    Blood Glucose Monitoring Suppl (ONE TOUCH ULTRA 2) W/DEVICE KIT One daily 1 each 0    desonide (DESOWEN) 0.05 % cream 1 APPLICATION APPLY ON THE SKIN TWICE A DAY APPLY TO FACE TWICE DAILY.  EPINEPHrine (EPIPEN 2-PAK) 0.3 MG/0.3ML Solution Auto-injector injection Inject 0.3 mLs (0.3 mg total) into the muscle once as needed (Anaphylaxis) 1 each 0    ferrous sulfate 325 (65 FE) MG tablet  ferrous sulfate 325 mg (65 mg iron) tablet      Flovent HFA 110 MCG/ACT inhaler Inhale 1 puff into the lungs 2 (two) times daily      fluocinonide (LIDEX) 0.05 % gel       fluticasone (FLONASE) 50 MCG/ACT nasal spray       glipiZIDE (Glucotrol) 10 MG tablet Take 2 tablets (20 mg total) by mouth 2 (two) times daily before meals 360 tablet 0    glucose blood (OneTouch Ultra) test strip 2 times daily 200 each 3    Lancets (onetouch ultrasoft) lancets Use to check blood sugars 2 times daily 200 each 3    lidocaine (Lidoderm) 5 % Place 1 patch onto the skin daily as needed (pain) Remove & Discard patch within 12 hours 30 patch 0    linaGLIPtin (Tradjenta) 5 MG Tab Take 1 tablet (5 mg total) by mouth daily 90 tablet 1    metFORMIN (GLUCOPHAGE) 1000 MG tablet Take 1 tablet (1,000 mg total) by mouth 2 (two) times daily 180 tablet 1    montelukast (SINGULAIR) 10 MG tablet daily         NASONEX 50 MCG/ACT nasal spray as needed.     5    pantoprazole (PROTONIX) 40 MG tablet Take 1 tablet (40 mg total) by mouth daily 90 tablet 3    [DISCONTINUED] valsartan-hydroCHLOROthiazide (DIOVAN-HCT) 160-12.5 MG per tablet Take 1 tablet by mouth daily 90 tablet 3     Current Facility-Administered Medications for the 02/16/21 encounter (Office Visit) with Myer Haff, FNP   Medication Dose Route Frequency Provider Last Rate Last Admin    triamcinolone acetonide (KENALOG-40) 40 MG/ML injection 80 mg  80 mg Intra-articular Once Hallal, Nadim L, MD         Social History     Tobacco Use    Smoking status: Never    Smokeless tobacco: Never   Vaping Use    Vaping Use: Never used   Substance Use Topics    Alcohol use: No    Drug use: No      Family History   Problem Relation Age of Onset    Diabetes Mother     Diabetes Sister     Malignant hyperthermia Neg Hx     Pseudochol deficiency Neg Hx     Anesthesia problems Neg Hx            ===================================================================    Additional Documentation:

## 2021-02-16 NOTE — Progress Notes (Signed)
Have you seen any specialists/other providers since your last visit with [place]?    Yes    Do you agree to telemedicine visit?  No    Arm preference verified?   Yes, no preference    Health Maintenance Due   Topic Date Due    Advance Directive on File  Never done    MAMMOGRAM  Never done    DXA Scan  Never done    Medicare Annual Wellness Visit  Never done    Shingrix Vaccine 50+ (1) Never done    DEPRESSION SCREENING  Never done    HEPATITIS C SCREENING  Never done    COVID-19 Vaccine (4 - Booster for Moderna series) 12/02/2020    FALLS RISK ANNUAL  02/09/2021    URINE MICROALBUMIN  02/09/2021

## 2021-02-17 ENCOUNTER — Telehealth: Payer: Self-pay | Admitting: Family Medicine

## 2021-02-17 ENCOUNTER — Encounter (INDEPENDENT_AMBULATORY_CARE_PROVIDER_SITE_OTHER): Payer: Self-pay | Admitting: Family

## 2021-02-17 LAB — CBC AND DIFFERENTIAL
Baso(Absolute): 0.1 10*3/uL (ref 0.0–0.2)
Basos: 1 %
Eos: 2 %
Eosinophils Absolute: 0.1 10*3/uL (ref 0.0–0.4)
Hematocrit: 36.1 % (ref 34.0–46.6)
Hemoglobin: 11.1 g/dL (ref 11.1–15.9)
Immature Granulocytes Absolute: 0 10*3/uL (ref 0.0–0.1)
Immature Granulocytes: 0 %
Lymphocytes Absolute: 2.1 10*3/uL (ref 0.7–3.1)
Lymphocytes: 38 %
MCH: 22.9 pg — ABNORMAL LOW (ref 26.6–33.0)
MCHC: 30.7 g/dL — ABNORMAL LOW (ref 31.5–35.7)
MCV: 75 fL — ABNORMAL LOW (ref 79–97)
Monocytes Absolute: 0.7 10*3/uL (ref 0.1–0.9)
Monocytes: 13 %
Neutrophils Absolute: 2.5 10*3/uL (ref 1.4–7.0)
Neutrophils: 46 %
Platelets: 252 10*3/uL (ref 150–450)
RBC: 4.84 x10E6/uL (ref 3.77–5.28)
RDW: 13.2 % (ref 11.7–15.4)
WBC: 5.4 10*3/uL (ref 3.4–10.8)

## 2021-02-17 LAB — LIPID PANEL
Cholesterol / HDL Ratio: 2.2 ratio (ref 0.0–4.4)
Cholesterol: 147 mg/dL (ref 100–199)
HDL: 66 mg/dL (ref >39)
LDL Chol Calculated (NIH): 64 mg/dL (ref 0–99)
Triglycerides: 90 mg/dL (ref 0–149)
VLDL Calculated: 17 mg/dL (ref 5–40)

## 2021-02-17 LAB — COMPREHENSIVE METABOLIC PANEL
ALT: 16 IU/L (ref 0–32)
AST (SGOT): 20 IU/L (ref 0–40)
Albumin/Globulin Ratio: 1.7 (ref 1.2–2.2)
Albumin: 4.5 g/dL (ref 3.7–4.7)
Alkaline Phosphatase: 75 IU/L (ref 44–121)
BUN / Creatinine Ratio: 16 (ref 12–28)
BUN: 13 mg/dL (ref 8–27)
Bilirubin, Total: 0.7 mg/dL (ref 0.0–1.2)
CO2: 24 mmol/L (ref 20–29)
Calcium: 9.7 mg/dL (ref 8.7–10.3)
Chloride: 100 mmol/L (ref 96–106)
Creatinine: 0.8 mg/dL (ref 0.57–1.00)
Globulin, Total: 2.7 g/dL (ref 1.5–4.5)
Glucose: 135 mg/dL — ABNORMAL HIGH (ref 65–99)
Potassium: 4.2 mmol/L (ref 3.5–5.2)
Protein, Total: 7.2 g/dL (ref 6.0–8.5)
Sodium: 141 mmol/L (ref 134–144)
eGFR: 77 mL/min/{1.73_m2} (ref >59)

## 2021-02-17 LAB — IRON PROFILE
Iron Saturation: 25 % (ref 15–55)
Iron: 70 ug/dL (ref 27–139)
TIBC: 278 ug/dL (ref 250–450)
UIBC: 208 ug/dL (ref 118–369)

## 2021-02-17 LAB — FERRITIN: Ferritin: 406 ng/mL — ABNORMAL HIGH (ref 15–150)

## 2021-02-17 LAB — MICROALBUMIN, RANDOM URINE
Creatinine, UR: 14.1 mg/dL
Microalb/Crt. Ratio: <21 mg/g creat (ref 0–29)
Microalbumin, UR: <3 ug/mL

## 2021-02-17 LAB — THYROID STIMULATING HORMONE (TSH), REFLEX ON ABNORMAL TO FREE T4, SERUM: TSH: 1.26 u[IU]/mL (ref 0.450–4.500)

## 2021-02-17 LAB — HEMOGLOBIN A1C: Hemoglobin A1C: 7.9 % — ABNORMAL HIGH (ref 4.8–5.6)

## 2021-02-17 LAB — VITAMIN D,25 OH,TOTAL: Vitamin D 25-Hydroxy: 33.3 ng/mL (ref 30.0–100.0)

## 2021-02-17 NOTE — Telephone Encounter (Signed)
Left message for patient to call back and schedule Medicare Annual Wellness Visit (AWV) in office.   If not able to come in office, please offer to do virtually or by telephone.   Last AWV: 07/20/2019  Please schedule at anytime with [facility]-Nurse Health Advisor.  If any questions, please contact me at [phone]

## 2021-02-17 NOTE — Patient Instructions (Addendum)
MEDICARE WELLNESS PERSONAL PREVENTION PLAN   As part of the Medicare Wellness portion of your visit today, we are providing you with this personalized preventative plan of care. We have listed below some of the preventative services that are recommended for patients based upon their age and gender. These recommendations are taken directly from the [place] [employer] (USPSTF) and the [employer] (ACIP).     Health Maintenance   Topic Date Due    Advance Directive on File  Never done    DXA Scan  Never done    Shingrix Vaccine 50+ (1) Never done    DEPRESSION SCREENING  Never done    HEPATITIS C SCREENING  Never done    COVID-19 Vaccine (4 - Booster for Moderna series) 12/02/2020    INFLUENZA VACCINE  04/03/2021    DM OPHTHALMOLOGY EXAM  09/17/2021    Statin Use  10/24/2021    MAMMOGRAM  12/13/2021    FALLS RISK ANNUAL  02/16/2022    HEMOGLOBIN A1C ANNUAL  02/16/2022    URINE MICROALBUMIN  02/16/2022    Medicare Annual Wellness Visit  02/17/2022    Colorectal Cancer Screening  07/18/2025    Pneumonia Vaccine Age 62+  Completed    PAP SMEAR  Discontinued       Health Maintenance Topics with due status: Overdue       Topic Date Due    Advance Directive on File Never done    DXA Scan Never done    Shingrix Vaccine 50+ Never done    DEPRESSION SCREENING Never done    HEPATITIS C SCREENING Never done    COVID-19 Vaccine 12/02/2020        Immunization History   Administered Date(s) Administered    COVID-19 mRNA Vaccine Preservative Free 0.5 mL (MODERNA) 12/17/2019, 01/14/2020    COVID-19 mRNA vaccine 12 years and above [employer] Purple Cap) 30 mcg/0.3 mL 08/03/2020    INFLUENZA HIGH DOSE 65 YRS+ 05/19/2014, 09/24/2016, 06/12/2017    INFLUENZA HIGH DOSE 65 YRS+ Quad 0.7 mL 05/02/2019    Influenza (Im) Preservative Free 07/08/2009, 09/15/2010, 07/09/2011, 06/06/2012, 06/03/2013, 06/03/2013, 07/04/2013    Pneumococcal 23 valent 09/15/2010, 09/04/2011    Pneumococcal  Conjugate 13-Valent 06/10/2018    Tdap 04/25/2018        Your major risk factors:   Recommendations for improvement:      The list below includes many common screening recommendations but is not meant to be comprehensive. You may be eligible for other preventative services depending upon your personal risk factors.   Colorectal Cancer Screening - All adults age 28-75 should undergo periodic colorectal cancer screening. The decision to screen for colorectal cancer in adults aged 75 to 71 years should be an individual one,taking into account your overall health and prior screening history.   Breast Cancer Screening - Women age 59-74 should have mammograms every other year (please note that this recommendation may not be appropriate for every woman - your physician can answer specific questions you may have). The USPSTF concludes that the current evidence is insufficient to assess the balance of benefits and harms of screening mammography in women aged 43 years or older.    Cervical Cancer Screening - Women over 39 do not require pap smears as long as prior screening has been normal and are not otherwise at high risk for cervical cancer. For women aged 51 to 18 years, the USPSTF recommends screening every 3 years with cervical cytology alone,  every 5 years with high-risk human papillomavirus (hrHPV) testing alone, or every 5 years with hrHPV testing in combination with cytology (cotesting).   Osteoporosis Screening -  The USPSTF recommends screening for osteoporosis with bone measurement testing to prevent osteoporotic fractures in women 65 years and older.  Hepatitis C Screening - Recommend screening for hepatitis C virus (HCV) infection in all adults aged 75 to 7879 years.  Lung cancer Screening - Recommend annual screening for lung cancer with low-dose computed tomography (LDCT) in adults ages 8250 to 3280 years who have a 20 pack-year smoking history and currently smoke or have quit within the past 15 years.  Recommended  Vaccinations   Influenza one dose annually   Tetanus/diphtheria one booster every 10 years   Zoster/Shingles - Shingrix two doses after age 75 (second dose given 2-6 months after first dose)  Pneumovax (PPSV23) one dose for adults aged ?65 years  Prevnar(PCV13) shared clinical decision-making is recommended regarding administration of this vaccine to persons aged ?65 years who do not have an immunocompromising condition, cerebrospinal fluid leak, or cochlear implant.     PERSONAL PREVENTION PLAN   Your Personal Prevention Plan is based on your overall health and your responses to the health questionnaire you completed. The following information is for you to review in addition to the recommendations, referrals, and tests we have discussed at your visit.     Physical Activity:   Physical activity can help you maintain a healthy weight, prevent or control illness, reduce stress, and sleep better. It can also help you improve your balance to avoid falls. Try to build up to and maintain a total of 30 minutes of activity each day. If you are able, try walking, doing yard or housework, and taking the stairs more often. You can also strengthen your muscles with exercises done while sitting or lying down.   Emotional Health:   Feeling "down in the dumps" or anxious every now and then is a natural part of life. If this feeling lasts for a few weeks or more, talk with me as soon as possible. It could be a sign of a problem that needs treatment. There are many types of treatment available.   Falls:   You can reduce your risk of falling by making changes in your home. Remove items that may cause tripping, improve lighting, and consider installing grab bars.   Talk with me if you have problems with balance and walking. To prevent falls, you may need your vision, hearing, or blood pressure checked. Exercises to improve your strength and balance, or using a cane or walker, may help. Review your medicines with me at every visit,  because some can affect balance. Please be sure to let me know if you fall or are fearful you may fall.   Urinary Leakage:   Urine leakage is common, but it is not a normal part of aging. Talk with me about any urine leakage so that the cause can be found and treated. Treatment can include bladder training, exercises, medicine or surgery.   Pain:   We all have aches and pains at times, but chronic pain can change how you feel and live every day. Please talk with me about any symptoms of chronic pain so that we can determine how best to treat.   Sleep:   Getting a good night's sleep is vital to your health and well-being and can help prevent or manage health problems. Often, sleep can be improved by changing behaviors,  including when you go to bed and what you do before bed. Sleep apnea can cause problems such as struggling to stay awake during the day. Please let me know if you would like to learn more about improving your sleep and/or think you may have sleep apnea.   Seat Belt:   Please remember to wear a seat belt when driving or riding in a vehicle. It is one of the most important things you can do to stay safe in a car.   Nutrition:   Remember to eat plenty of fruits, vegetables, whole grains, and dairy. Drink at least 64 ounces (8 full glasses) of water a day, unless you have been advised to limit fluids.   Alcohol:   Alcohol can have a greater effect on older people, who may feel its effects at a lower amount. Older people should limit alcoholic drinks (no more than one a day for women and no more than two a day for men). Please let me know if alcohol use becomes a problem.   Tobacco:   Not smoking or using other forms of tobacco is one of the most important things you can do for your health. Here is some more information about the importance of quitting smoking and how to quit smoking - [employer].pt  Advance Directives:   There may come a time when medical decisions need  to be made on your behalf. Please talk with your family, and with me, about your wishes. It is important to provide information about your decisions, and any formal advance directives, for your medical record. Here is additional information on advanced directives - [email or web address]  Additional Support:   Sometimes it can be challenging to manage all aspects of daily life. Finding the right support can help you maintain or improve your health and independence. Please let me know if you would like to talk further about finding resources to assist you.

## 2021-02-21 ENCOUNTER — Telehealth (INDEPENDENT_AMBULATORY_CARE_PROVIDER_SITE_OTHER): Payer: Self-pay | Admitting: Family

## 2021-02-21 ENCOUNTER — Other Ambulatory Visit (INDEPENDENT_AMBULATORY_CARE_PROVIDER_SITE_OTHER): Payer: Self-pay | Admitting: Family

## 2021-02-21 DIAGNOSIS — E1165 Type 2 diabetes mellitus with hyperglycemia: Secondary | ICD-10-CM

## 2021-02-21 DIAGNOSIS — R7989 Other specified abnormal findings of blood chemistry: Secondary | ICD-10-CM

## 2021-02-21 NOTE — Telephone Encounter (Signed)
Patient called regarding lab results. Please advise

## 2021-02-22 ENCOUNTER — Encounter (INDEPENDENT_AMBULATORY_CARE_PROVIDER_SITE_OTHER): Payer: Self-pay | Admitting: Family

## 2021-02-22 DIAGNOSIS — R7989 Other specified abnormal findings of blood chemistry: Secondary | ICD-10-CM

## 2021-02-22 NOTE — Telephone Encounter (Signed)
Attempted to call patient but call wouldn't connect. Will try tomorrow in the office.

## 2021-02-23 ENCOUNTER — Ambulatory Visit (INDEPENDENT_AMBULATORY_CARE_PROVIDER_SITE_OTHER): Payer: Medicare Other

## 2021-02-23 ENCOUNTER — Other Ambulatory Visit: Payer: Self-pay

## 2021-02-23 VITALS — BP 120/70 | HR 80 | Temp 98.0°F | Ht 65.5 in | Wt 176.0 lb

## 2021-02-23 DIAGNOSIS — Z0000 Encounter for general adult medical examination without abnormal findings: Secondary | ICD-10-CM | POA: Diagnosis not present

## 2021-02-23 NOTE — Progress Notes (Signed)
Subjective:   LORALYE LOBERG is a 75 y.o. female who presents for Medicare Annual (Subsequent) preventive examination.  Review of Systems    N/A Cardiac Risk Factors include: advanced age (>86men, >2 women);hypertension;dyslipidemia     Objective:    Today's Vitals   02/23/21 1408 02/23/21 1410  BP: 120/70   Pulse: 80   Temp: 98 F (36.7 C)   TempSrc: Temporal   SpO2: 94%   Weight: 176 lb (79.8 kg)   Height: 5' 5.5" (1.664 m)   PainSc:  3    Body mass index is 28.84 kg/m.  Advanced Directives 02/23/2021 01/16/2021 01/16/2021 11/12/2020 06/20/2012 06/19/2012  Does Patient Have a Medical Advance Directive? No Yes Yes No Patient has advance directive, copy not in chart Patient has advance directive, copy not in chart  Type of Advance Directive - Healthcare Power of [place]  Does patient want to make changes to medical advance directive? - Yes (Inpatient - patient defers changing a medical advance directive at this time - Information given) No - Patient declined - - -  Copy of [place] in Chart? - No - copy requested - - - Copy requested from other (Comment)  Would patient like information on creating a medical advance directive? No - Patient declined - - No - Patient declined - -    Current Medications (verified) Outpatient Encounter Medications as of 02/23/2021  Medication Sig   acetaminophen (TYLENOL) 325 MG tablet Take 2 tablets (650 mg total) by mouth every 6 (six) hours as needed for pain.   acyclovir (ZOVIRAX) 400 MG tablet Take 1 tablet (400 mg total) by mouth daily. (Patient taking differently: Take 400 mg by mouth daily.)   atorvastatin (LIPITOR) 40 MG tablet Take 1 tablet (40 mg total) by mouth daily. (Patient taking differently: Take 40 mg by mouth daily.)   calcium-vitamin D (OSCAL WITH D) 250-125 MG-UNIT tablet Take 1 tablet by mouth daily.   citalopram  (CELEXA) 10 MG tablet Take 1 tablet (10 mg total) by mouth daily. (Patient taking differently: Take 10 mg by mouth at bedtime.)   diphenoxylate-atropine (LOMOTIL) 2.5-0.025 MG tablet Take 1 tablet by mouth 4 (four) times daily as needed for diarrhea or loose stools.   Ferrous Sulfate (IRON PO) Take 1 tablet by mouth daily.   fluocinonide gel (LIDEX) 2.45 % Apply 1 application topically 3 (three) times daily as needed (rash).   hydrochlorothiazide (HYDRODIURIL) 25 MG tablet Take 1 tablet (25 mg total) by mouth daily.   KLOR-CON M20 20 MEQ tablet TAKE 1 TABLET DAILY (Patient taking differently: Take 20 mEq by mouth at bedtime.)   levothyroxine (SYNTHROID) 50 MCG tablet Take 1 tablet (50 mcg total) by mouth daily. (Patient taking differently: Take 50 mcg by mouth daily.)   Magnesium Oxide 400 MG CAPS Take 1 capsule (400 mg total) by mouth daily.   meloxicam (MOBIC) 15 MG tablet Take 1 tablet (15 mg total) by mouth daily.   No facility-administered encounter medications on file as of 02/23/2021.    Allergies (verified) Patient has no known allergies.   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, takes meloxicam   Wears contact lenses    Past Surgical History:  Procedure Laterality Date   ABDOMINAL HYSTERECTOMY  1982   CHOLECYSTECTOMY  06/20/2012   Procedure: LAPAROSCOPIC  CHOLECYSTECTOMY;  Surgeon: Zenovia Jarred, MD;  Location: [facility];  Service: General;  Laterality: N/A;   COLONOSCOPY  last one 03-20-2019  dr Ardis Hughs   DIAGNOSTIC LAPAROSCOPY  yrs ago   HIP ARTHROPLASTY Right 11/13/2020   Procedure: ARTHROPLASTY BIPOLAR HIP (HEMIARTHROPLASTY);  Surgeon: Carole Civil, MD;  Location: [facility];  Service: Orthopedics;  Laterality: Right;   INCONTINENCE SURGERY  09-11-2005   dr Jeffie Pollock  @[facility]    LYNX SLING   LAPAROSCOPIC SALPINGO OOPHERECTOMY Bilateral 01/16/2021   Procedure: DIAGNOSTIC LAPAROSCOPY;  Surgeon: Molli Posey, MD;  Location: [facility];  Service: Gynecology;  Laterality: Bilateral;   LAPAROSCOPY Bilateral 01/16/2021   Procedure: EXPLORATORY LAPAROTOMY BILATERAL SALPINGO OOPHORECTOMY;  Surgeon: Molli Posey, MD;  Location: [facility];  Service: Gynecology;  Laterality: Bilateral;   TUBAL LIGATION     Family History  Problem Relation Age of Onset   Colon polyps Brother    Breast cancer Maternal Grandmother    Diabetes Brother    Hypertension Brother    Colon cancer Neg Hx    Esophageal cancer Neg Hx    Rectal cancer Neg Hx    Stomach cancer Neg Hx    Social History   Socioeconomic History   Marital status: Divorced    Spouse name: Not on file   Number of children: Not on file   Years of education: Not on file   Highest education level: Not on file  Occupational History   Not on file  Tobacco Use   Smoking status: Never   Smokeless tobacco: Never  Vaping Use   Vaping Use: Never used  Substance and Sexual Activity   Alcohol use: No   Drug use: Never   Sexual activity: Not on file  Other Topics Concern   Not on file  Social History Narrative   Not on file   Social Determinants of Health   Financial Resource Strain: Low Risk    Difficulty of Paying Living Expenses: Not hard at all  Food Insecurity: No Food Insecurity   Worried About Charity fundraiser in the Last Year: Never true   Ran  Out of Food in the Last Year: Never true  Transportation Needs: No Transportation Needs   Lack of Transportation (Medical): No   Lack of Transportation (Non-Medical): No  Physical Activity: Insufficiently Active   Days of Exercise per Week: 7 days   Minutes of Exercise per Session: 20 min  Stress: No Stress Concern Present   Feeling of Stress : Not at all  Social Connections: Moderately Integrated   Frequency of Communication with Friends and Family: More than three times a week   Frequency of Social Gatherings with Friends and Family: More than three times a week   Attends Religious Services: More than 4 times per  year   Active Member of Clubs or Organizations: Yes   Attends Archivist Meetings: More than 4 times per year   Marital Status: Widowed    Tobacco Counseling Counseling given: Not Answered   Clinical Intake:  Pre-visit preparation completed: Yes  Pain : 0-10 Pain Score: 3  Pain Type: Chronic pain Pain Location: Leg Pain Orientation: Right Pain Onset: More than a month ago Pain Frequency: Intermittent     Nutritional Risks: None Diabetes: No  How often do you need to have someone help you when you read instructions, pamphlets, or other written materials from your doctor or pharmacy?: 1 - Never  Diabetic?No  Interpreter Needed?: No  Information entered by :: Ruby of Daily Living In your present state of health, do you have any difficulty performing the following activities: 02/23/2021 01/16/2021  Hearing? N -  Vision? N -  Difficulty concentrating or making decisions? N -  Walking or climbing stairs? N -  Dressing or bathing? N -  Doing errands, shopping? N N  Preparing Food and eating ? N -  Using the Toilet? N -  In the past six months, have you accidently leaked urine? N -  Do you have problems with loss of bowel control? N -  Managing your Medications? N -  Managing your Finances? N -  Housekeeping or managing your  Housekeeping? N -  Some recent data might be hidden    Patient Care Team: Susy Frizzle, MD as PCP - General (Family Medicine)  Indicate any recent Medical Services you may have received from other than [facility] providers in the past year (date may be approximate).     Assessment:   This is a routine wellness examination for [place].  Hearing/Vision screen Vision Screening - Comments:: Patient gets eye exams once per year. Wears glasses and contacts.  Dietary issues and exercise activities discussed: Current Exercise Habits: Home exercise routine, Type of exercise: stretching;walking, Time (Minutes): 20, Frequency (Times/Week): 7, Weekly Exercise (Minutes/Week): 140, Intensity: Mild   Goals Addressed             This Visit's Progress    Prevent falls         Depression Screen PHQ 2/9 Scores 02/23/2021 12/02/2020 07/20/2019 10/28/2018 07/11/2018 07/08/2017 01/21/2017  PHQ - 2 Score 0 0 0 0 0 0 0  PHQ- 9 Score - - - - - 2 0    Fall Risk Fall Risk  02/23/2021 12/02/2020 07/20/2019 10/28/2018 07/11/2018  Falls in the past year? 1 1 0 0 1  Number falls in past yr: 0 1 - - 0  Injury with Fall? 1 1 - - 0  Follow up Falls evaluation completed;Falls prevention discussed - Falls evaluation completed Falls evaluation completed Falls evaluation completed    FALL RISK PREVENTION PERTAINING TO THE HOME:  Any stairs in or around the home? No  If so, are there any without handrails? No  Home free of loose throw rugs in walkways, pet beds, electrical cords, etc? Yes  Adequate lighting in your home to reduce risk of falls? Yes   ASSISTIVE DEVICES UTILIZED TO PREVENT FALLS:  Life alert? No  Use of a cane, walker or w/c? Yes  Grab bars in the bathroom? Yes  Shower chair or bench in shower? Yes  Elevated toilet seat or a handicapped toilet? No   TIMED UP AND GO:  Was the test performed? Yes .  Length of time to ambulate 10 feet: 3  sec.   Gait steady and fast without use of assistive  device  Cognitive Function:    Normal cognitive status assessed by direct observation by this Nurse Health Advisor. No abnormalities found.      Immunizations Immunization History  Administered Date(s) Administered   Fluad Quad(high Dose 65+) 05/21/2019   Influenza Split 06/20/2012, 06/29/2020   Influenza, High Dose Seasonal PF 06/20/2017, 07/11/2018, 06/29/2020   Influenza,inj,Quad PF,6+ Mos 06/01/2013, 05/13/2014, 06/27/2015   PFIZER(Purple Top)SARS-COV-2 Vaccination 11/30/2019, 12/23/2019, 06/07/2020   Pneumococcal Conjugate-13 10/23/2013   Pneumococcal Polysaccharide-23 06/20/2012   Tdap 06/20/2017   Zoster Recombinat (Shingrix) 08/29/2017, 12/13/2017   Zoster, Live 08/04/2009    TDAP status: Up to date  Flu Vaccine status: Up to date  Pneumococcal vaccine status: Up to date  Covid-19 vaccine status: Completed vaccines  Qualifies for Shingles Vaccine? Yes   Zostavax completed Yes   Shingrix Completed?: Yes  Screening Tests Health Maintenance  Topic Date Due   FOOT EXAM  Never done   URINE MICROALBUMIN  Never done   HEMOGLOBIN A1C  07/24/2017   OPHTHALMOLOGY EXAM  09/03/2018   MAMMOGRAM  03/10/2020   COVID-19 Vaccine (4 - Booster for Pfizer series) 10/08/2020   INFLUENZA VACCINE  04/03/2021   TETANUS/TDAP  06/21/2027   COLONOSCOPY (Pts 45-46yrs Insurance coverage will need to be confirmed)  03/19/2029   DEXA SCAN  Completed   Hepatitis C Screening  Completed   PNA vac Low Risk Adult  Completed   Zoster Vaccines- Shingrix  Completed   HPV VACCINES  Aged Out    Health Maintenance  Health Maintenance Due  Topic Date Due   FOOT EXAM  Never done   URINE MICROALBUMIN  Never done   HEMOGLOBIN A1C  07/24/2017   OPHTHALMOLOGY EXAM  09/03/2018   MAMMOGRAM  03/10/2020   COVID-19 Vaccine (4 - Booster for Cana series) 10/08/2020    Colorectal cancer screening: No longer required.   Mammogram status: Completed 03/10/2018. Repeat every year  Bone Density  status: Completed 03/10/2019. Results reflect: Bone density results: OSTEOPENIA. Repeat every 2 years.  Lung Cancer Screening: (Low Dose CT Chest recommended if Age 71-80 years, 30 pack-year currently smoking OR have quit w/in 15years.) does not qualify.   Lung Cancer Screening Referral: N/A  Additional Screening:  Hepatitis C Screening: does qualify; Completed 01/02/2016  Vision Screening: Recommended annual ophthalmology exams for early detection of glaucoma and other disorders of the eye. Is the patient up to date with their annual eye exam?  Yes  Who is the provider or what is the name of the office in which the patient attends annual eye exams? [facility] If pt is not established with a provider, would they like to be referred to a provider to establish care? No .   Dental Screening: Recommended annual dental exams for proper oral hygiene  Community Resource Referral / Chronic Care Management: CRR required this visit?  No   CCM required this visit?  No      Plan:     I have personally reviewed and noted the following in the patient's chart:   Medical and social history Use of alcohol, tobacco or illicit drugs  Current medications and supplements including opioid prescriptions.  Functional ability and status Nutritional status Physical activity Advanced directives List of other physicians Hospitalizations, surgeries, and ER visits in previous 12 months Vitals Screenings to include cognitive, depression, and falls Referrals and appointments  In addition, I have reviewed and discussed with patient certain preventive protocols,  quality metrics, and best practice recommendations. A written personalized care plan for preventive services as well as general preventive health recommendations were provided to patient.     Ofilia Neas, LPN   11/15/9456   Nurse Notes: None

## 2021-02-23 NOTE — Patient Instructions (Signed)
April Beck , Thank you for taking time to come for your Medicare Wellness Visit. I appreciate your ongoing commitment to your health goals. Please review the following plan we discussed and let me know if I can assist you in the future.   Screening recommendations/referrals: Colonoscopy: No longer required  Mammogram: Currently due, keep appointment for August  Bone Density: Coming up due, you can get as usual at OB/GYN  Recommended yearly ophthalmology/optometry visit for glaucoma screening and checkup Recommended yearly dental visit for hygiene and checkup  Vaccinations: Influenza vaccine: Up to date, next due fall 2022 Pneumococcal vaccine: Completed series Tdap vaccine: Up to date, next due 06/21/2027 Shingles vaccine: Completed series     Advanced directives: Please bring in a copy of your advanced medical directives so that we may scan into your chart.  Conditions/risks identified: None   Next appointment: None    Preventive Care 65 Years and Older, Female Preventive care refers to lifestyle choices and visits with your health care provider that can promote health and wellness. What does preventive care include? A yearly physical exam. This is also called an annual well check. Dental exams once or twice a year. Routine eye exams. Ask your health care provider how often you should have your eyes checked. Personal lifestyle choices, including: Daily care of your teeth and gums. Regular physical activity. Eating a healthy diet. Avoiding tobacco and drug use. Limiting alcohol use. Practicing safe sex. Taking low-dose aspirin every day. Taking vitamin and mineral supplements as recommended by your health care provider. What happens during an annual well check? The services and screenings done by your health care provider during your annual well check will depend on your age, overall health, lifestyle risk factors, and family history of disease. Counseling  Your health care  provider may ask you questions about your: Alcohol use. Tobacco use. Drug use. Emotional well-being. Home and relationship well-being. Sexual activity. Eating habits. History of falls. Memory and ability to understand (cognition). Work and work Statistician. Reproductive health. Screening  You may have the following tests or measurements: Height, weight, and BMI. Blood pressure. Lipid and cholesterol levels. These may be checked every 5 years, or more frequently if you are over 56 years old. Skin check. Lung cancer screening. You may have this screening every year starting at age 14 if you have a 30-pack-year history of smoking and currently smoke or have quit within the past 15 years. Fecal occult blood test (FOBT) of the stool. You may have this test every year starting at age 68. Flexible sigmoidoscopy or colonoscopy. You may have a sigmoidoscopy every 5 years or a colonoscopy every 10 years starting at age 24. Hepatitis C blood test. Hepatitis B blood test. Sexually transmitted disease (STD) testing. Diabetes screening. This is done by checking your blood sugar (glucose) after you have not eaten for a while (fasting). You may have this done every 1-3 years. Bone density scan. This is done to screen for osteoporosis. You may have this done starting at age 42. Mammogram. This may be done every 1-2 years. Talk to your health care provider about how often you should have regular mammograms. Talk with your health care provider about your test results, treatment options, and if necessary, the need for more tests. Vaccines  Your health care provider may recommend certain vaccines, such as: Influenza vaccine. This is recommended every year. Tetanus, diphtheria, and acellular pertussis (Tdap, Td) vaccine. You may need a Td booster every 10 years. Zoster vaccine. You  may need this after age 66. Pneumococcal 13-valent conjugate (PCV13) vaccine. One dose is recommended after age  86. Pneumococcal polysaccharide (PPSV23) vaccine. One dose is recommended after age 86. Talk to your health care provider about which screenings and vaccines you need and how often you need them. This information is not intended to replace advice given to you by your health care provider. Make sure you discuss any questions you have with your health care provider. Document Released: 09/16/2015 Document Revised: 05/09/2016 Document Reviewed: 06/21/2015 Elsevier Interactive Patient Education  2017 [place] Prevention in the Home Falls can cause injuries. They can happen to people of all ages. There are many things you can do to make your home safe and to help prevent falls. What can I do on the outside of my home? Regularly fix the edges of walkways and driveways and fix any cracks. Remove anything that might make you trip as you walk through a door, such as a raised step or threshold. Trim any bushes or trees on the path to your home. Use bright outdoor lighting. Clear any walking paths of anything that might make someone trip, such as rocks or tools. Regularly check to see if handrails are loose or broken. Make sure that both sides of any steps have handrails. Any raised decks and porches should have guardrails on the edges. Have any leaves, snow, or ice cleared regularly. Use sand or salt on walking paths during winter. Clean up any spills in your garage right away. This includes oil or grease spills. What can I do in the bathroom? Use night lights. Install grab bars by the toilet and in the tub and shower. Do not use towel bars as grab bars. Use non-skid mats or decals in the tub or shower. If you need to sit down in the shower, use a plastic, non-slip stool. Keep the floor dry. Clean up any water that spills on the floor as soon as it happens. Remove soap buildup in the tub or shower regularly. Attach bath mats securely with double-sided non-slip rug tape. Do not have throw  rugs and other things on the floor that can make you trip. What can I do in the bedroom? Use night lights. Make sure that you have a light by your bed that is easy to reach. Do not use any sheets or blankets that are too big for your bed. They should not hang down onto the floor. Have a firm chair that has side arms. You can use this for support while you get dressed. Do not have throw rugs and other things on the floor that can make you trip. What can I do in the kitchen? Clean up any spills right away. Avoid walking on wet floors. Keep items that you use a lot in easy-to-reach places. If you need to reach something above you, use a strong step stool that has a grab bar. Keep electrical cords out of the way. Do not use floor polish or wax that makes floors slippery. If you must use wax, use non-skid floor wax. Do not have throw rugs and other things on the floor that can make you trip. What can I do with my stairs? Do not leave any items on the stairs. Make sure that there are handrails on both sides of the stairs and use them. Fix handrails that are broken or loose. Make sure that handrails are as long as the stairways. Check any carpeting to make sure that it is firmly  attached to the stairs. Fix any carpet that is loose or worn. Avoid having throw rugs at the top or bottom of the stairs. If you do have throw rugs, attach them to the floor with carpet tape. Make sure that you have a light switch at the top of the stairs and the bottom of the stairs. If you do not have them, ask someone to add them for you. What else can I do to help prevent falls? Wear shoes that: Do not have high heels. Have rubber bottoms. Are comfortable and fit you well. Are closed at the toe. Do not wear sandals. If you use a stepladder: Make sure that it is fully opened. Do not climb a closed stepladder. Make sure that both sides of the stepladder are locked into place. Ask someone to hold it for you, if  possible. Clearly mark and make sure that you can see: Any grab bars or handrails. First and last steps. Where the edge of each step is. Use tools that help you move around (mobility aids) if they are needed. These include: Canes. Walkers. Scooters. Crutches. Turn on the lights when you go into a dark area. Replace any light bulbs as soon as they burn out. Set up your furniture so you have a clear path. Avoid moving your furniture around. If any of your floors are uneven, fix them. If there are any pets around you, be aware of where they are. Review your medicines with your doctor. Some medicines can make you feel dizzy. This can increase your chance of falling. Ask your doctor what other things that you can do to help prevent falls. This information is not intended to replace advice given to you by your health care provider. Make sure you discuss any questions you have with your health care provider. Document Released: 06/16/2009 Document Revised: 01/26/2016 Document Reviewed: 09/24/2014 Elsevier Interactive Patient Education  2017 [employer].

## 2021-02-24 ENCOUNTER — Encounter: Payer: Self-pay | Admitting: Family Medicine

## 2021-02-27 ENCOUNTER — Other Ambulatory Visit: Payer: Self-pay | Admitting: Family

## 2021-02-28 ENCOUNTER — Telehealth (INDEPENDENT_AMBULATORY_CARE_PROVIDER_SITE_OTHER): Payer: Self-pay | Admitting: Family

## 2021-02-28 DIAGNOSIS — E559 Vitamin D deficiency, unspecified: Secondary | ICD-10-CM

## 2021-02-28 MED ORDER — VITAMIN D3 50 MCG (2000 UT) PO TABS
1.0000 | ORAL_TABLET | Freq: Every day | ORAL | 3 refills | Status: DC
Start: 2021-02-28 — End: 2022-01-19

## 2021-02-28 NOTE — Telephone Encounter (Signed)
Sent in 2000IU daily

## 2021-02-28 NOTE — Telephone Encounter (Signed)
Needs a refill on her Vit D after DEXA scan results called into the [facility] on file.

## 2021-03-02 ENCOUNTER — Other Ambulatory Visit: Payer: 59

## 2021-03-07 LAB — TRANSFERRIN SATURATION, SERUM
Iron: 78 ug/dL
TRANSFERRIN SATURATION: 27 % Saturation
Transferrin: 205 mg/dL

## 2021-03-07 LAB — HEMOCHROMATOSIS MUTATION

## 2021-03-08 ENCOUNTER — Encounter (INDEPENDENT_AMBULATORY_CARE_PROVIDER_SITE_OTHER): Payer: Self-pay | Admitting: Family

## 2021-03-08 ENCOUNTER — Other Ambulatory Visit (INDEPENDENT_AMBULATORY_CARE_PROVIDER_SITE_OTHER): Payer: Self-pay | Admitting: Family

## 2021-03-08 DIAGNOSIS — R7989 Other specified abnormal findings of blood chemistry: Secondary | ICD-10-CM

## 2021-03-11 DIAGNOSIS — Z20822 Contact with and (suspected) exposure to covid-19: Secondary | ICD-10-CM | POA: Diagnosis not present

## 2021-03-22 ENCOUNTER — Ambulatory Visit (INDEPENDENT_AMBULATORY_CARE_PROVIDER_SITE_OTHER): Payer: 59 | Admitting: Family

## 2021-03-22 ENCOUNTER — Encounter (INDEPENDENT_AMBULATORY_CARE_PROVIDER_SITE_OTHER): Payer: Self-pay | Admitting: Family

## 2021-03-22 VITALS — BP 133/70 | HR 81 | Temp 98.1°F

## 2021-03-22 DIAGNOSIS — E782 Mixed hyperlipidemia: Secondary | ICD-10-CM

## 2021-03-22 DIAGNOSIS — Z6829 Body mass index (BMI) 29.0-29.9, adult: Secondary | ICD-10-CM

## 2021-03-22 DIAGNOSIS — E1165 Type 2 diabetes mellitus with hyperglycemia: Secondary | ICD-10-CM

## 2021-03-22 NOTE — Progress Notes (Signed)
Have you seen any specialists/other providers since your last visit with us?    No    Do you agree to telemedicine visit?  No    Arm preference verified?   Yes, no preference    Health Maintenance Due   Topic Date Due    Advance Directive on File  Never done    Shingrix Vaccine 50+ (1) Never done    DEPRESSION SCREENING  Never done    HEPATITIS C SCREENING  Never done    COVID-19 Vaccine (4 - Booster for Moderna series) 12/02/2020

## 2021-03-22 NOTE — Progress Notes (Signed)
Primary Care  Laurell Josephs  PROGRESS NOTE      Patient: Helen [name]   Date: 03/22/2021   MRN: [id number]     Helen [name] is a 75 y.o. female    Chief Complaint   Patient presents with    Referral     Diabetic nutritionist at endo office  [facility] - diabetic education and medical nutrition   Fax to [phone]  Needs signature for diabetic shoe       MEDICATIONS     Current Outpatient Medications   Medication Sig Dispense Refill    albuterol (PROVENTIL) (2.5 MG/3ML) 0.083% nebulizer solution Take 2.5 mg by nebulization every 6 (six) hours as needed.      amLODIPine (NORVASC) 10 MG tablet Take 1 tablet (10 mg total) by mouth daily 90 tablet 3    atorvastatin (LIPITOR) 40 MG tablet Take 1 tablet (40 mg total) by mouth daily 90 tablet 1    Blood Glucose Monitoring Suppl (ONE TOUCH ULTRA 2) W/DEVICE KIT One daily 1 each 0    Cholecalciferol (Vitamin D3) 2000 UNIT capsule Take by mouth daily      Cholecalciferol (Vitamin D3) 50 MCG (2000 UT) Tab Take 1 tablet by mouth daily 90 tablet 3    desonide (DESOWEN) 0.05 % cream 1 APPLICATION APPLY ON THE SKIN TWICE A DAY APPLY TO FACE TWICE DAILY.      diclofenac Sodium (VOLTAREN) 1 % Gel topical gel Apply 2 g topically 4 (four) times daily 1 Tube 3    EPINEPHrine (EPIPEN 2-PAK) 0.3 MG/0.3ML Solution Auto-injector injection Inject 0.3 mLs (0.3 mg total) into the muscle once as needed (Anaphylaxis) 1 each 0    ferrous sulfate 325 (65 FE) MG tablet ferrous sulfate 325 mg (65 mg iron) tablet      Flovent HFA 110 MCG/ACT inhaler Inhale 1 puff into the lungs 2 (two) times daily      fluocinonide (LIDEX) 0.05 % gel       fluticasone (FLONASE) 50 MCG/ACT nasal spray       glipiZIDE (Glucotrol) 10 MG tablet Take 2 tablets (20 mg total) by mouth 2 (two) times daily before meals 360 tablet 0    glucose blood (OneTouch Ultra) test strip 2 times daily 200 each 3    Lancets (onetouch ultrasoft) lancets Use to check blood sugars 2 times daily 200 each 3    lidocaine (Lidoderm) 5 %  Place 1 patch onto the skin daily as needed (pain) Remove & Discard patch within 12 hours 30 patch 0    linaGLIPtin (Tradjenta) 5 MG Tab Take 1 tablet (5 mg total) by mouth daily 90 tablet 1    metFORMIN (GLUCOPHAGE) 1000 MG tablet Take 1 tablet (1,000 mg total) by mouth 2 (two) times daily 180 tablet 1    montelukast (SINGULAIR) 10 MG tablet daily         NASONEX 50 MCG/ACT nasal spray as needed.     5    pantoprazole (PROTONIX) 40 MG tablet Take 1 tablet (40 mg total) by mouth daily 90 tablet 3    valsartan-hydroCHLOROthiazide (DIOVAN-HCT) 160-12.5 MG per tablet Take 1 tablet by mouth daily 90 tablet 3     Current Facility-Administered Medications   Medication Dose Route Frequency Provider Last Rate Last Admin    triamcinolone acetonide (KENALOG-40) 40 MG/ML injection 80 mg  80 mg Intra-articular Once Hallal, Nadim L, MD           Allergies   Allergen  Reactions    Aspirin      Break out in sweat and dizziness     Peppers     Pollen Extract     Latex Itching and Rash       SUBJECTIVE     Chief Complaint   Patient presents with    Referral     Diabetic nutritionist at endo office  [facility] - diabetic education and medical nutrition   Fax to [phone]  Needs signature for diabetic shoe        HPI  Pt following up on DMII for referrals  A1C in June was 7.9. she is taking metformin, glipizide and tradjenta. Checking sugars at home, generally 130-140. She has been modify her lifestyle and diet first to see if she can make any improvements.     She is working on weight loss. Will est with a dietician- needs referral. Also increasing her exercise.     She is est with podiatry- just had appt and is going to get therapeutic footwear, needs the order signed. She has PAD and pre-ulcerative callus formation.     ROS     Review of Systems   Constitutional:  Negative for chills, fatigue, fever and unexpected weight change.   Respiratory:  Negative for cough and shortness of breath.    Cardiovascular:  Negative for chest  pain.   Gastrointestinal:  Negative for abdominal pain, diarrhea, nausea and vomiting.   Endocrine: Negative for polydipsia and polyuria.   Neurological:  Negative for dizziness and headaches.           The following portions of the patient's history were reviewed and updated as appropriate: Allergies, Current Medications, Past Family History, Past Medical history, Past social history, Past surgical history, and Problem List.    PHYSICAL EXAM     Vitals:    03/22/21 1501 03/22/21 1502   BP: 140/64 133/70   BP Site: Left arm Left arm   Patient Position: Sitting Sitting   Cuff Size: Medium Medium   Pulse: 81    Temp: 98.1 F (36.7 C)    TempSrc: Temporal    SpO2: 97%        Physical Exam  Vitals and nursing note reviewed.   Constitutional:       General: She is awake. She is not in acute distress.     Appearance: Normal appearance.   Pulmonary:      Effort: Pulmonary effort is normal.   Neurological:      Mental Status: She is alert and oriented to person, place, and time.   Psychiatric:         Mood and Affect: Mood normal.         Speech: Speech normal.         Behavior: Behavior normal.           ASSESSMENT/PLAN        1. Uncontrolled type 2 diabetes mellitus with hyperglycemia  Ambulatory referral to Nutrition Services      2. Mixed hyperlipidemia  Ambulatory referral to Nutrition Services      3. BMI 29.0-29.9,adult  Ambulatory referral to Nutrition Services        Referral orders placed and order signed for footwear  Continue with diet/ exercise modification  Will be due for labs in 2 months    Reviewed s/s that would warrant further and/ or immediate medical attention. Pt in agreement with plan and all questions answered.  Risk & Benefits of the new medication(s) were explained to the patient (and family) who verbalized understanding & agreed to the treatment plan. Patient (family) encouraged to contact me/clinical staff with any questions/concerns      Return in about 2 months (around 05/23/2021) for DMII  follow up AND as needed.    Signed,  Myer Haff, FNP, FNP  03/22/2021

## 2021-03-24 ENCOUNTER — Encounter (INDEPENDENT_AMBULATORY_CARE_PROVIDER_SITE_OTHER): Payer: Self-pay | Admitting: Family

## 2021-03-24 DIAGNOSIS — R7989 Other specified abnormal findings of blood chemistry: Secondary | ICD-10-CM

## 2021-03-31 ENCOUNTER — Ambulatory Visit (INDEPENDENT_AMBULATORY_CARE_PROVIDER_SITE_OTHER): Payer: Medicare Other | Admitting: Family Medicine

## 2021-03-31 ENCOUNTER — Encounter: Payer: Self-pay | Admitting: Family Medicine

## 2021-03-31 ENCOUNTER — Other Ambulatory Visit: Payer: Self-pay

## 2021-03-31 VITALS — BP 124/66 | HR 76 | Temp 98.2°F | Resp 16 | Ht 65.5 in | Wt 173.0 lb

## 2021-03-31 DIAGNOSIS — E038 Other specified hypothyroidism: Secondary | ICD-10-CM | POA: Diagnosis not present

## 2021-03-31 DIAGNOSIS — E7800 Pure hypercholesterolemia, unspecified: Secondary | ICD-10-CM | POA: Diagnosis not present

## 2021-03-31 DIAGNOSIS — I10 Essential (primary) hypertension: Secondary | ICD-10-CM

## 2021-03-31 DIAGNOSIS — R790 Abnormal level of blood mineral: Secondary | ICD-10-CM

## 2021-03-31 DIAGNOSIS — Z0000 Encounter for general adult medical examination without abnormal findings: Secondary | ICD-10-CM

## 2021-03-31 NOTE — Progress Notes (Signed)
Subjective:    Patient ID: April Beck, female    DOB: Apr 05, 1946, 75 y.o.   MRN: [id number]  Patient is here today for complete physical exam.  Patient has a history of stage III chronic kidney disease.  We are due to check fasting lab work today.  Her blood pressure today is outstanding.  She denies any chest pain shortness of breath or dyspnea on exertion.  Her immunizations are up-to-date except for a booster on her COVID-vaccine and we discussed this at length.  She also has several seborrheic keratoses, 1 of which is in her right armpit, some underneath her bra line that are aggravating her.  She wanted [place] just to make sure that they were not malignant.  I reassured her that they are normal.  Aside from that she is doing well.  She is still taking iron and she is still taking magnesium.  Her hemoglobin in May was normal at over 13.  Therefore I feel that she can stop the iron.  I also recommended that she stop the magnesium however I will recheck her level to ensure that its not too low prior to stopping it Immunization History  Administered Date(s) Administered   Fluad Quad(high Dose 65+) 05/21/2019   Influenza Split 06/20/2012, 06/29/2020   Influenza, High Dose Seasonal PF 06/20/2017, 07/11/2018, 06/29/2020   Influenza,inj,Quad PF,6+ Mos 06/01/2013, 05/13/2014, 06/27/2015   PFIZER(Purple Top)SARS-COV-2 Vaccination 11/30/2019, 12/23/2019, 06/07/2020   Pneumococcal Conjugate-13 10/23/2013   Pneumococcal Polysaccharide-23 06/20/2012   Tdap 06/20/2017   Zoster Recombinat (Shingrix) 08/29/2017, 12/13/2017   Zoster, Live 08/04/2009     Past Medical History:  Diagnosis Date   Anxiety    hx of panic attack   Arthritis    hands & knees   CKD (chronic kidney disease), stage III ([place])    followed by pcp   Edema of both lower extremities    GERD (gastroesophageal reflux disease)    History of 2019 novel coronavirus disease (COVID-19) 09/17/2020   positive result in epic,  per pt  very mild symtpoms that resolved   History of uterine fibroid    Hypertension    followed by pcp   Hypomagnesemia    Hypothyroidism    followed by pcp   IDA (iron deficiency anemia)    Osteopenia    Ovarian cyst    Pelvic pain    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, takes meloxicam   Wears contact lenses    Past Surgical History:  Procedure Laterality Date   ABDOMINAL HYSTERECTOMY  1982   CHOLECYSTECTOMY  06/20/2012   Procedure: LAPAROSCOPIC CHOLECYSTECTOMY;  Surgeon: Zenovia Jarred, MD;  Location: [place];  Service: General;  Laterality: N/A;   COLONOSCOPY  last one 03-20-2019  dr Ardis Hughs   DIAGNOSTIC LAPAROSCOPY  yrs ago   HIP ARTHROPLASTY Right 11/13/2020   Procedure: ARTHROPLASTY BIPOLAR HIP (HEMIARTHROPLASTY);  Surgeon: Carole Civil, MD;  Location: [facility];  Service: Orthopedics;  Laterality: Right;   INCONTINENCE SURGERY  09-11-2005   dr Jeffie Pollock  '@[email or web address]'$    LYNX SLING   LAPAROSCOPIC SALPINGO OOPHERECTOMY Bilateral 01/16/2021   Procedure: DIAGNOSTIC LAPAROSCOPY;  Surgeon: Molli Posey, MD;  Location: [facility];  Service: Gynecology;  Laterality: Bilateral;   LAPAROSCOPY Bilateral 01/16/2021   Procedure: EXPLORATORY LAPAROTOMY BILATERAL SALPINGO OOPHORECTOMY;  Surgeon: Molli Posey, MD;  Location: [facility];  Service: Gynecology;  Laterality: Bilateral;   TUBAL LIGATION     Current Outpatient Medications  on File Prior to Visit  Medication Sig Dispense Refill   acetaminophen (TYLENOL) 325 MG tablet Take 2 tablets (650 mg total) by mouth every 6 (six) hours as needed for pain.     acyclovir (ZOVIRAX) 400 MG tablet Take 1 tablet (400 mg total) by mouth daily. (Patient taking differently: Take 400 mg by mouth daily.) 90 tablet 3   atorvastatin (LIPITOR) 40 MG tablet Take 1 tablet (40 mg total) by mouth daily. (Patient taking differently: Take 40 mg by mouth daily.) 90 tablet 3   calcium-vitamin D (OSCAL WITH D)  250-125 MG-UNIT tablet Take 1 tablet by mouth daily.     citalopram (CELEXA) 10 MG tablet Take 1 tablet (10 mg total) by mouth daily. (Patient taking differently: Take 10 mg by mouth at bedtime.) 90 tablet 3   diphenoxylate-atropine (LOMOTIL) 2.5-0.025 MG tablet Take 1 tablet by mouth 4 (four) times daily as needed for diarrhea or loose stools. 30 tablet 0   Ferrous Sulfate (IRON PO) Take 1 tablet by mouth daily.     fluocinonide gel (LIDEX) [id number] % Apply 1 application topically 3 (three) times daily as needed (rash).     hydrochlorothiazide (HYDRODIURIL) 25 MG tablet Take 1 tablet (25 mg total) by mouth daily. 90 tablet 3   KLOR-CON M20 20 MEQ tablet TAKE 1 TABLET DAILY (Patient taking differently: Take 20 mEq by mouth at bedtime.) 90 tablet 3   levothyroxine (SYNTHROID) 50 MCG tablet Take 1 tablet (50 mcg total) by mouth daily. (Patient taking differently: Take 50 mcg by mouth daily.) 90 tablet 3   Magnesium Oxide 400 MG CAPS Take 1 capsule (400 mg total) by mouth daily. 90 capsule 3   meloxicam (MOBIC) 15 MG tablet Take 1 tablet (15 mg total) by mouth daily. 90 tablet 3   No current facility-administered medications on file prior to visit.   No Known Allergies Social History   Socioeconomic History   Marital status: Divorced    Spouse name: Not on file   Number of children: Not on file   Years of education: Not on file   Highest education level: Not on file  Occupational History   Not on file  Tobacco Use   Smoking status: Never   Smokeless tobacco: Never  Vaping Use   Vaping Use: Never used  Substance and Sexual Activity   Alcohol use: No   Drug use: Never   Sexual activity: Not on file  Other Topics Concern   Not on file  Social History Narrative   Not on file   Social Determinants of Health   Financial Resource Strain: Low Risk    Difficulty of Paying Living Expenses: Not hard at all  Food Insecurity: No Food Insecurity   Worried About Running Out of Food in the Last  Year: Never true   [place] in the Last Year: Never true  Transportation Needs: No Transportation Needs   Lack of Transportation (Medical): No   Lack of Transportation (Non-Medical): No  Physical Activity: Insufficiently Active   Days of Exercise per Week: 7 days   Minutes of Exercise per Session: 20 min  Stress: No Stress Concern Present   Feeling of Stress : Not at all  Social Connections: Moderately Integrated   Frequency of Communication with Friends and Family: More than three times a week   Frequency of Social Gatherings with Friends and Family: More than three times a week   Attends Religious Services: More than 4 times per year  Active Member of Clubs or Organizations: Yes   Attends Archivist Meetings: More than 4 times per year   Marital Status: Widowed  Human resources officer Violence: Not At Risk   Fear of Current or Ex-Partner: No   Emotionally Abused: No   Physically Abused: No   Sexually Abused: No       Review of Systems  All other systems reviewed and are negative.     Objective:   Physical Exam Vitals reviewed.  Constitutional:      General: She is not in acute distress.    Appearance: She is well-developed. She is not diaphoretic.  HENT:     Head: Normocephalic and atraumatic.     Right Ear: External ear normal.     Left Ear: External ear normal.     Nose: Nose normal.     Mouth/Throat:     Pharynx: No oropharyngeal exudate.  Eyes:     General: No scleral icterus.       Right eye: No discharge.        Left eye: No discharge.     Conjunctiva/sclera: Conjunctivae normal.     Pupils: Pupils are equal, round, and reactive to light.  Neck:     Thyroid: No thyromegaly.     Vascular: No JVD.     Trachea: No tracheal deviation.  Cardiovascular:     Rate and Rhythm: Normal rate and regular rhythm.     Heart sounds: Normal heart sounds. No murmur heard.   No friction rub. No gallop.  Pulmonary:     Effort: Pulmonary effort is normal. No  respiratory distress.     Breath sounds: Normal breath sounds. No stridor. No wheezing or rales.  Chest:     Chest wall: No tenderness.  Abdominal:     General: Bowel sounds are normal. There is no distension.     Palpations: Abdomen is soft. There is no mass.     Tenderness: There is no abdominal tenderness. There is no guarding or rebound.  Musculoskeletal:        General: No tenderness or deformity.     Cervical back: Normal range of motion and neck supple.  Lymphadenopathy:     Cervical: No cervical adenopathy.  Skin:    General: Skin is warm.     Coloration: Skin is not pale.     Findings: No erythema or rash.  Neurological:     Mental Status: She is alert and oriented to person, place, and time.     Cranial Nerves: No cranial nerve deficit.     Motor: No abnormal muscle tone.     Coordination: Coordination normal.     Deep Tendon Reflexes: Reflexes are normal and symmetric. Reflexes normal.  Psychiatric:        Behavior: Behavior normal.        Thought Content: Thought content normal.        Judgment: Judgment normal.          Assessment & Plan:  Benign essential HTN - Plan: CBC with Differential/Platelet, COMPLETE METABOLIC PANEL WITH GFR, Lipid panel  Other specified hypothyroidism - Plan: TSH  Pure hypercholesterolemia - Plan: CBC with Differential/Platelet, COMPLETE METABOLIC PANEL WITH GFR, Lipid panel  Low magnesium level - Plan: Magnesium  Encounter for Medicare annual wellness exam Mammogram is performed through her gynecologist and is up-to-date.  Patient had a diagnostic laparoscopy this year to remove a cyst on her ovary.  Thankfully the biopsy revealed benign tissue.  She  is recovering well from surgery with no complications.  I feel that she can discontinue iron.  I feel that she can discontinue magnesium.  I will repeat a CBC CMP lipid panel and TSH.  If her magnesium level is normal discontinue magnesium.  Ensure adequate dosage of levothyroxine with  TSH.  Blood pressure today is well controlled.  Monitor renal function with a CMP.  Immunizations are up-to-date.

## 2021-04-01 LAB — COMPLETE METABOLIC PANEL WITH GFR
AG Ratio: 2.2 (calc) (ref 1.0–2.5)
ALT: 17 U/L (ref 6–29)
AST: 21 U/L (ref 10–35)
Albumin: 4.1 g/dL (ref 3.6–5.1)
Alkaline phosphatase (APISO): 59 U/L (ref 37–153)
BUN/Creatinine Ratio: 18 (calc) (ref 6–22)
BUN: 19 mg/dL (ref 7–25)
CO2: 24 mmol/L (ref 20–32)
Calcium: 9.4 mg/dL (ref 8.6–10.4)
Chloride: 106 mmol/L (ref 98–110)
Creat: 1.05 mg/dL — ABNORMAL HIGH (ref 0.60–1.00)
Globulin: 1.9 g/dL (calc) (ref 1.9–3.7)
Glucose, Bld: 92 mg/dL (ref 65–99)
Potassium: 4.4 mmol/L (ref 3.5–5.3)
Sodium: 141 mmol/L (ref 135–146)
Total Bilirubin: 0.6 mg/dL (ref 0.2–1.2)
Total Protein: 6 g/dL — ABNORMAL LOW (ref 6.1–8.1)
eGFR: 56 mL/min/{1.73_m2} — ABNORMAL LOW (ref >=60)

## 2021-04-01 LAB — LIPID PANEL
Cholesterol: 138 mg/dL (ref <200)
HDL: 55 mg/dL (ref >=50)
LDL Cholesterol (Calc): 67 mg/dL (calc)
Non-HDL Cholesterol (Calc): 83 mg/dL (calc) (ref <130)
Total CHOL/HDL Ratio: 2.5 (calc) (ref <5.0)
Triglycerides: 82 mg/dL (ref <150)

## 2021-04-01 LAB — CBC WITH DIFFERENTIAL/PLATELET
Absolute Monocytes: 490 cells/uL (ref 200–950)
Basophils Absolute: 29 cells/uL (ref 0–200)
Basophils Relative: 0.5 %
Eosinophils Absolute: 222 cells/uL (ref 15–500)
Eosinophils Relative: 3.9 %
HCT: 38.6 % (ref 35.0–45.0)
Hemoglobin: 12.9 g/dL (ref 11.7–15.5)
Lymphs Abs: 1830 cells/uL (ref 850–3900)
MCH: 31.2 pg (ref 27.0–33.0)
MCHC: 33.4 g/dL (ref 32.0–36.0)
MCV: 93.5 fL (ref 80.0–100.0)
MPV: 10.3 fL (ref 7.5–12.5)
Monocytes Relative: 8.6 %
Neutro Abs: 3129 cells/uL (ref 1500–7800)
Neutrophils Relative %: 54.9 %
Platelets: 196 10*3/uL (ref 140–400)
RBC: 4.13 10*6/uL (ref 3.80–5.10)
RDW: 11.8 % (ref 11.0–15.0)
Total Lymphocyte: 32.1 %
WBC: 5.7 10*3/uL (ref 3.8–10.8)

## 2021-04-01 LAB — TSH: TSH: 1.89 mIU/L (ref 0.40–4.50)

## 2021-04-01 LAB — MAGNESIUM: Magnesium: 1.6 mg/dL (ref 1.5–2.5)

## 2021-04-06 ENCOUNTER — Encounter (INDEPENDENT_AMBULATORY_CARE_PROVIDER_SITE_OTHER): Payer: Self-pay

## 2021-04-06 ENCOUNTER — Telehealth (INDEPENDENT_AMBULATORY_CARE_PROVIDER_SITE_OTHER): Payer: 59

## 2021-04-06 DIAGNOSIS — E1165 Type 2 diabetes mellitus with hyperglycemia: Secondary | ICD-10-CM

## 2021-04-06 NOTE — Progress Notes (Signed)
[facility]   EndocrinologyTelemedicine Follow-up  via HIPAA compliant secure video link   Date 04/06/2021 (Start time 1400 - End Time 1447)    Originating site (Patient location): Home   Distant site (Provider location): [facility]  Provider and Title: Godfrey Pick, RN  Consent obtained: Yes (verbal)   Language: English      Knowledge and Learning Needs Assessment:    Ratings:   1 = needs instruction  2 = needs review  3 = comprehends key points  4 = demonstrates competency  N/a = not assessed    Knowledge of diabetes disease process: 2  Nutrition Management: 2  Understanding of Physical Activity: 2  Medication Use: 2  Monitoring and Using Results: 2  Preventing Acute Complications: 2  Psychosocial Adjustment: 2  Behavior Change Strategies: 2  Risk Reduction Strategies: 2      Met with Helen [name] for initial assessment of Diabetes after being referred by Myer Haff, FNP for Diabetes Self Management Education and Medical Nutrition Therapy (MNT). Pt is accompanied by : patient  Latest A1C was 7.8%.    Medication/allergies/immunization has been reviewed: yes      History with diabetes:  Diagnosed with Type 2 diabetes about 20 years.    Medications for diabetes: Currently taking  metformin and trajenta      Monitoring:    Patient already has a meter and is testing twice daily after breakfast and dinner.  Advised to call  doctor for prescription for more testing supplies. Patient verbalized understanding.   BG recommended goals discussed with patient. Encouraged patient to start to check BG daily fasting, and to test pre and 2 hours post start of largest meal ; 3 days a week.  Meaningful testing was explained.    Log book was provided. Importance of keeping records was discussed. Patient agrees to keep record of BG in log book.     Point of Care Blood Glucose:  N/A    Blood Pressure:    Was within the recommended range (<140/90).  Was above  target recommendation (>140/90).  Pt was advised to notify their physician and monitor their BP more regularly at home and track in a log.    Exercise:    Currently patient is walking 3 - 4x weekly     Nutrition:   Patient reports since the diagnosis with diabetes, some changes to diet have included: smaller portions, reading labels, limiting carb intake.   The Healthy Plate Method was explained briefly.     Risk reduction:  Dilated eye exam with in the last 12 months: yes  Diabetic foot exam with in the last 12 months: yes     Counseling was provided to the patient on the benefits of yearly dilated eye exams and  diabetic foot exams in the prevention of diabetes-related complications.                   Discussion:  Patient created a behavior change goal today of: Practice carb counting.   No learning deficit identified.   The Educator has assessed their overall outcome of learning so far to be :Fully Achieved  LWWD class packet was given to patient. Class 1 and 2 curriculum was discussed with patient.    Patient was encouraged to email/call this provider with any concerns or questions.     Plan: Patient is scheduled for the following visits:  LWWD Classes 1, 2  and 3; Needs scheduled for   MNT follow up with an RD

## 2021-04-07 ENCOUNTER — Encounter (INDEPENDENT_AMBULATORY_CARE_PROVIDER_SITE_OTHER): Payer: Self-pay | Admitting: Family

## 2021-04-07 DIAGNOSIS — R7989 Other specified abnormal findings of blood chemistry: Secondary | ICD-10-CM

## 2021-04-17 DIAGNOSIS — N958 Other specified menopausal and perimenopausal disorders: Secondary | ICD-10-CM | POA: Diagnosis not present

## 2021-04-17 DIAGNOSIS — Z6827 Body mass index (BMI) 27.0-27.9, adult: Secondary | ICD-10-CM | POA: Diagnosis not present

## 2021-04-17 DIAGNOSIS — Z01419 Encounter for gynecological examination (general) (routine) without abnormal findings: Secondary | ICD-10-CM | POA: Diagnosis not present

## 2021-04-17 DIAGNOSIS — R300 Dysuria: Secondary | ICD-10-CM | POA: Diagnosis not present

## 2021-04-17 DIAGNOSIS — Z1231 Encounter for screening mammogram for malignant neoplasm of breast: Secondary | ICD-10-CM | POA: Diagnosis not present

## 2021-04-17 DIAGNOSIS — M8588 Other specified disorders of bone density and structure, other site: Secondary | ICD-10-CM | POA: Diagnosis not present

## 2021-04-24 ENCOUNTER — Other Ambulatory Visit (INDEPENDENT_AMBULATORY_CARE_PROVIDER_SITE_OTHER): Payer: Self-pay

## 2021-04-27 ENCOUNTER — Telehealth (HOSPITAL_BASED_OUTPATIENT_CLINIC_OR_DEPARTMENT_OTHER): Payer: 59

## 2021-05-03 ENCOUNTER — Other Ambulatory Visit (INDEPENDENT_AMBULATORY_CARE_PROVIDER_SITE_OTHER): Payer: Self-pay | Admitting: Family

## 2021-05-03 DIAGNOSIS — E1165 Type 2 diabetes mellitus with hyperglycemia: Secondary | ICD-10-CM

## 2021-05-04 ENCOUNTER — Telehealth (HOSPITAL_BASED_OUTPATIENT_CLINIC_OR_DEPARTMENT_OTHER): Payer: 59 | Admitting: Registered Nurse

## 2021-05-11 ENCOUNTER — Telehealth (HOSPITAL_BASED_OUTPATIENT_CLINIC_OR_DEPARTMENT_OTHER): Payer: 59

## 2021-05-12 ENCOUNTER — Telehealth (HOSPITAL_BASED_OUTPATIENT_CLINIC_OR_DEPARTMENT_OTHER): Payer: 59

## 2021-05-12 NOTE — Progress Notes (Deleted)
error 

## 2021-05-16 ENCOUNTER — Encounter: Payer: Self-pay | Admitting: Family Medicine

## 2021-05-16 ENCOUNTER — Ambulatory Visit (INDEPENDENT_AMBULATORY_CARE_PROVIDER_SITE_OTHER): Payer: Medicare Other | Admitting: Family Medicine

## 2021-05-16 ENCOUNTER — Other Ambulatory Visit: Payer: Self-pay

## 2021-05-16 ENCOUNTER — Ambulatory Visit (INDEPENDENT_AMBULATORY_CARE_PROVIDER_SITE_OTHER): Payer: 59 | Admitting: Family

## 2021-05-16 ENCOUNTER — Encounter (INDEPENDENT_AMBULATORY_CARE_PROVIDER_SITE_OTHER): Payer: Self-pay | Admitting: Family

## 2021-05-16 VITALS — BP 126/82 | HR 84 | Ht 65.5 in | Wt 174.0 lb

## 2021-05-16 DIAGNOSIS — R059 Cough, unspecified: Secondary | ICD-10-CM

## 2021-05-16 DIAGNOSIS — J4541 Moderate persistent asthma with (acute) exacerbation: Secondary | ICD-10-CM

## 2021-05-16 DIAGNOSIS — E119 Type 2 diabetes mellitus without complications: Secondary | ICD-10-CM

## 2021-05-16 DIAGNOSIS — U071 COVID-19: Secondary | ICD-10-CM

## 2021-05-16 DIAGNOSIS — R300 Dysuria: Secondary | ICD-10-CM | POA: Diagnosis not present

## 2021-05-16 DIAGNOSIS — N390 Urinary tract infection, site not specified: Secondary | ICD-10-CM

## 2021-05-16 DIAGNOSIS — E612 Magnesium deficiency: Secondary | ICD-10-CM | POA: Diagnosis not present

## 2021-05-16 LAB — URINALYSIS, ROUTINE W REFLEX MICROSCOPIC
Hgb urine dipstick: NEGATIVE
Nitrite: POSITIVE — AB
Specific Gravity, Urine: 1.02 (ref 1.001–1.035)
pH: 6 (ref 5.0–8.0)

## 2021-05-16 LAB — MICROSCOPIC MESSAGE

## 2021-05-16 LAB — POCT HEMOGLOBIN A1C: POCT Hgb A1C: 8.1 % — AB (ref 3.9–5.9)

## 2021-05-16 MED ORDER — CEFTRIAXONE SODIUM 1 G IJ SOLR
1.0000 g | Freq: Once | INTRAMUSCULAR | Status: AC
  Administered 2021-05-16: 1 g via INTRAMUSCULAR

## 2021-05-16 MED ORDER — CEPHALEXIN 500 MG PO CAPS: 500.0000 mg | ORAL_CAPSULE | Freq: Three times a day (TID) | ORAL | 0 refills | Status: DC

## 2021-05-16 MED ORDER — FLUTICASONE PROPIONATE HFA 220 MCG/ACT IN AERO
1.0000 | INHALATION_SPRAY | Freq: Two times a day (BID) | RESPIRATORY_TRACT | 2 refills | Status: DC
Start: 2021-05-16 — End: 2021-08-15

## 2021-05-16 MED ORDER — ALBUTEROL SULFATE (2.5 MG/3ML) 0.083% IN NEBU
2.5000 mg | INHALATION_SOLUTION | RESPIRATORY_TRACT | 1 refills | Status: DC | PRN
Start: 2021-05-16 — End: 2022-08-08

## 2021-05-16 MED ORDER — ALBUTEROL SULFATE HFA 108 (90 BASE) MCG/ACT IN AERS
2.0000 | INHALATION_SPRAY | RESPIRATORY_TRACT | 2 refills | Status: DC | PRN
Start: 2021-05-16 — End: 2022-06-11

## 2021-05-16 NOTE — Progress Notes (Signed)
Subjective:    Patient ID: April Beck, female    DOB: 17-Dec-1945, 75 y.o.   MRN: [id number]  Last week, the patient developed symptoms of urinary tract infection.  She spoke to her gynecologist who started her on Macrobid for 7 days.  Since starting the [facility], she has gotten progressively worse.  She no longer has dysuria however she has pelvic discomfort and pelvic pain.  She is no longer taking Azo however her urine is dark yellow.  It shows +2 bilirubin, +1 protein, negative nitrates, and +1 leukocyte esterase.  She states that she feels extremely nauseated and weak.  She also has some nausea and vomiting.  She is vomited on 2 separate occasions.  She is not jaundiced today.  There is no scleral icterus.  She has no tenderness to palpation in the right upper quadrant. 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, takes meloxicam   Wears contact lenses    Past Surgical History:  Procedure Laterality Date   [place]  06/20/2012   Procedure: LAPAROSCOPIC CHOLECYSTECTOMY;  Surgeon: Zenovia Jarred, MD;  Location: [facility];  Service: General;  Laterality: N/A;   COLONOSCOPY  last one 03-20-2019  dr Ardis Hughs   DIAGNOSTIC LAPAROSCOPY  yrs ago   HIP ARTHROPLASTY Right 11/13/2020   Procedure: ARTHROPLASTY BIPOLAR HIP (HEMIARTHROPLASTY);  Surgeon: Carole Civil, MD;  Location: [facility];  Service: Orthopedics;  Laterality: Right;   INCONTINENCE SURGERY  09-11-2005   dr Jeffie Pollock  '@[email or web address]'$    LYNX SLING   LAPAROSCOPIC SALPINGO OOPHERECTOMY Bilateral 01/16/2021   Procedure: DIAGNOSTIC LAPAROSCOPY;  Surgeon: Molli Posey, MD;  Location: [facility];  Service: Gynecology;  Laterality: Bilateral;   LAPAROSCOPY Bilateral 01/16/2021   Procedure: EXPLORATORY LAPAROTOMY BILATERAL SALPINGO OOPHORECTOMY;  Surgeon: Molli Posey, MD;  Location: [facility];  Service: Gynecology;  Laterality: Bilateral;   TUBAL LIGATION     Current Outpatient Medications on File Prior to Visit  Medication Sig Dispense Refill   acetaminophen (TYLENOL) 325 MG tablet Take 2 tablets (650 mg total) by mouth every 6 (six) hours as needed for pain.     acyclovir (ZOVIRAX) 400 MG tablet Take 1 tablet (400 mg total) by mouth daily. (Patient taking differently: Take 400 mg by mouth daily.) 90 tablet 3   atorvastatin (LIPITOR) 40 MG tablet Take 1 tablet (40 mg total) by mouth daily. (Patient taking differently: Take 40 mg by mouth daily.) 90 tablet 3   calcium-vitamin D (OSCAL WITH D) 250-125 MG-UNIT tablet Take 1 tablet by mouth daily.     citalopram (CELEXA) 10 MG tablet Take 1 tablet (10 mg total) by mouth daily. (Patient taking differently: Take  10 mg by mouth at bedtime.) 90 tablet 3   diphenoxylate-atropine (LOMOTIL) 2.5-0.025 MG tablet Take 1 tablet by mouth 4 (four) times daily as needed for diarrhea or loose stools. 30 tablet 0   Ferrous Sulfate (IRON PO) Take 1 tablet by mouth daily.     fluocinonide gel (LIDEX) [id number]-123456789 % Apply 1 application topically 3 (three) times daily as needed (rash).     hydrochlorothiazide (HYDRODIURIL) 25 MG tablet Take 1 tablet (25 mg total) by mouth daily. 90 tablet 3   KLOR-CON M20 20 MEQ tablet TAKE 1 TABLET DAILY (Patient taking differently: Take 20 mEq by mouth at bedtime.) 90 tablet 3   levothyroxine (SYNTHROID) 50 MCG  tablet Take 1 tablet (50 mcg total) by mouth daily. (Patient taking differently: Take 50 mcg by mouth daily.) 90 tablet 3   Magnesium Oxide 400 MG CAPS Take 1 capsule (400 mg total) by mouth daily. 90 capsule 3   meloxicam (MOBIC) 15 MG tablet Take 1 tablet (15 mg total) by mouth daily. 90 tablet 3   No current facility-administered medications on file prior to visit.   No Known Allergies Social History   Socioeconomic History   Marital status: Divorced    Spouse name: Not on file   Number of children: Not on file   Years of education: Not on file   Highest education level: Not on file  Occupational History   Not on file  Tobacco Use   Smoking status: Never   Smokeless tobacco: Never  Vaping Use   Vaping Use: Never used  Substance and Sexual Activity   Alcohol use: No   Drug use: Never   Sexual activity: Not on file  Other Topics Concern   Not on file  Social History Narrative   Not on file   Social Determinants of Health   Financial Resource Strain: Low Risk    Difficulty of Paying Living Expenses: Not hard at all  Food Insecurity: No Food Insecurity   Worried About Running Out of Food in the Last Year: Never true   [place] in the Last Year: Never true  Transportation Needs: No Transportation Needs   Lack of Transportation (Medical): No   Lack of Transportation (Non-Medical): No  Physical Activity: Insufficiently Active   Days of Exercise per Week: 7 days   Minutes of Exercise per Session: 20 min  Stress: No Stress Concern Present   Feeling of Stress : Not at all  Social Connections: Moderately Integrated   Frequency of Communication with Friends and Family: More than three times a week   Frequency of Social Gatherings with Friends and Family: More than three times a week   Attends Religious Services: More than 4 times per year   Active Member of [employer] or Organizations: Yes   Attends Archivist Meetings: More than 4 times per year   Marital  Status: Widowed  Human resources officer Violence: Not At Risk   Fear of Current or Ex-Partner: No   Emotionally Abused: No   Physically Abused: No   Sexually Abused: No       Review of Systems  All other systems reviewed and are negative.     Objective:   Physical Exam Vitals reviewed.  Constitutional:      General: She is not in acute distress.    Appearance: She is well-developed. She is not diaphoretic.  HENT:     Head: Normocephalic and atraumatic.     Right Ear: External ear normal.  Left Ear: External ear normal.     Nose: Nose normal.     Mouth/Throat:     Pharynx: No oropharyngeal exudate.  Eyes:     General: No scleral icterus.       Right eye: No discharge.        Left eye: No discharge.     Conjunctiva/sclera: Conjunctivae normal.     Pupils: Pupils are equal, round, and reactive to light.  Neck:     Thyroid: No thyromegaly.     Vascular: No JVD.     Trachea: No tracheal deviation.  Cardiovascular:     Rate and Rhythm: Normal rate and regular rhythm.     Heart sounds: Normal heart sounds. No murmur heard.   No friction rub. No gallop.  Pulmonary:     Effort: Pulmonary effort is normal. No respiratory distress.     Breath sounds: Normal breath sounds. No stridor. No wheezing or rales.  Chest:     Chest wall: No tenderness.  Abdominal:     General: Bowel sounds are normal. There is no distension.     Palpations: Abdomen is soft. There is no mass.     Tenderness: There is no abdominal tenderness. There is no guarding or rebound.  Musculoskeletal:        General: No tenderness or deformity.     Cervical back: Normal range of motion and neck supple.  Lymphadenopathy:     Cervical: No cervical adenopathy.  Skin:    General: Skin is warm.     Coloration: Skin is not pale.     Findings: No erythema or rash.  Neurological:     Mental Status: She is alert and oriented to person, place, and time.     Cranial Nerves: No cranial nerve deficit.     Motor: No  abnormal muscle tone.     Coordination: Coordination normal.     Deep Tendon Reflexes: Reflexes are normal and symmetric. Reflexes normal.  Psychiatric:        Behavior: Behavior normal.        Thought Content: Thought content normal.        Judgment: Judgment normal.          Assessment & Plan:  Dysuria - Plan: Urinalysis, Routine w reflex microscopic, Urine Culture, COMPLETE METABOLIC PANEL WITH GFR, CBC with Differential  Urinary tract infection without hematuria, site unspecified - Plan: cefTRIAXone (ROCEPHIN) injection 1 g, COMPLETE METABOLIC PANEL WITH GFR, CBC with Differential First am concerned that she may have a resistant infection given the fact she is still having pelvic discomfort and leukocyte esterase on her urinalysis.  Therefore I will switch antibiotics from Macrobid to Keflex 500 mg p.o. 3 times daily and also gave the patient Rocephin 1 g IM x1.  Second I am concerned that she may have drug-induced liver injury given the fact she has +2 bilirubin, nausea and vomiting.  This is been associated with Macrobid.  Therefore of asked her to discontinue the Macrobid and I want to check a CBC and a CMP immediately.  If her liver test are elevated significantly, I would recommend a right upper quadrant ultrasound to evaluate for any other abnormalities aside from possible reaction to the [facility]

## 2021-05-16 NOTE — Progress Notes (Signed)
Have you seen any specialists/other providers since your last visit with [place]?    No    Do you agree to telemedicine visit?  No    Arm preference verified?   Yes, no preference    Health Maintenance Due   Topic Date Due    Advance Directive on File  Never done    Shingrix Vaccine 50+ (1) Never done    DEPRESSION SCREENING  Never done    HEPATITIS C SCREENING  Never done    COVID-19 Vaccine (4 - Booster for Moderna series) 12/02/2020    INFLUENZA VACCINE  04/03/2021

## 2021-05-16 NOTE — Progress Notes (Signed)
[facility]  Laurell Josephs  PROGRESS NOTE      Patient: Helen [name]   Date: 05/16/2021   MRN: [id number]     Helen [name] is a 75 y.o. female    Chief Complaint   Patient presents with    Asthma     Pt has had worsening astham sx for 1.5 months, will wake up coughing in the middle of the night       MEDICATIONS     Current Outpatient Medications   Medication Sig Dispense Refill    albuterol (PROVENTIL) (2.5 MG/3ML) 0.083% nebulizer solution Take 3 mLs (2.5 mg total) by nebulization every 4 (four) hours as needed for Shortness of Breath or Wheezing 75 mL 1    amLODIPine (NORVASC) 10 MG tablet Take 1 tablet (10 mg total) by mouth daily 90 tablet 3    atorvastatin (LIPITOR) 40 MG tablet Take 1 tablet (40 mg total) by mouth daily 90 tablet 1    Blood Glucose Monitoring Suppl (ONE TOUCH ULTRA 2) W/DEVICE KIT One daily 1 each 0    Cholecalciferol (Vitamin D3) 50 MCG (2000 UT) Tab Take 1 tablet by mouth daily 90 tablet 3    desonide (DESOWEN) 0.05 % cream 1 APPLICATION APPLY ON THE SKIN TWICE A DAY APPLY TO FACE TWICE DAILY.      EPINEPHrine (EPIPEN 2-PAK) 0.3 MG/0.3ML Solution Auto-injector injection Inject 0.3 mLs (0.3 mg total) into the muscle once as needed (Anaphylaxis) 1 each 0    ferrous sulfate 325 (65 FE) MG tablet ferrous sulfate 325 mg (65 mg iron) tablet      fluticasone (FLONASE) 50 MCG/ACT nasal spray       glipiZIDE (GLUCOTROL) 10 MG tablet TAKE 2 TABLETS (20 MG TOTAL) BY MOUTH 2 (TWO) TIMES DAILY BEFORE MEALS 360 tablet 0    glucose blood (OneTouch Ultra) test strip 2 times daily 200 each 3    Lancets (onetouch ultrasoft) lancets Use to check blood sugars 2 times daily 200 each 3    lidocaine (Lidoderm) 5 % Place 1 patch onto the skin daily as needed (pain) Remove & Discard patch within 12 hours 30 patch 0    linaGLIPtin (Tradjenta) 5 MG Tab Take 1 tablet (5 mg total) by mouth daily 90 tablet 1    metFORMIN (GLUCOPHAGE) 1000 MG tablet Take 1 tablet (1,000 mg total) by mouth 2 (two) times daily 180  tablet 1    montelukast (SINGULAIR) 10 MG tablet Take 10 mg by mouth daily      NASONEX 50 MCG/ACT nasal spray as needed.     5    pantoprazole (PROTONIX) 40 MG tablet Take 1 tablet (40 mg total) by mouth daily 90 tablet 3    valsartan-hydroCHLOROthiazide (DIOVAN-HCT) 160-12.5 MG per tablet Take 1 tablet by mouth daily 90 tablet 3    albuterol sulfate HFA (PROVENTIL) 108 (90 Base) MCG/ACT inhaler Inhale 2 puffs into the lungs every 4 (four) hours as needed for Wheezing or Shortness of Breath 1 each 2    diclofenac Sodium (VOLTAREN) 1 % Gel topical gel Apply 2 g topically 4 (four) times daily (Patient not taking: Reported on 05/16/2021) 1 Tube 3    fluocinonide (LIDEX) 0.05 % gel  (Patient not taking: Reported on 05/16/2021)      fluticasone (FLOVENT HFA) 220 MCG/ACT inhaler Inhale 1 puff into the lungs 2 (two) times daily 1 each 2     Current Facility-Administered Medications   Medication Dose Route Frequency Provider  Last Rate Last Admin    triamcinolone acetonide (KENALOG-40) 40 MG/ML injection 80 mg  80 mg Intra-articular Once Hallal, Nadim L, MD           Allergies   Allergen Reactions    Aspirin      Break out in sweat and dizziness     Peppers     Pollen Extract     Latex Itching and Rash       SUBJECTIVE     Chief Complaint   Patient presents with    Asthma     Pt has had worsening astham sx for 1.5 months, will wake up coughing in the middle of the night        HPI  States increased coughing and asthma symptoms since having COVID about a month ago, states symptoms were well controlled prior to this. Denies SOB and CP. States trying to cough stuff up but unable to. On flovent BID and singulair. Has not been using albuterol.    DMII: states has appt with endo in January. Is meeting with nutritionist and taking classes starting end of this week. States sugars are about 150 in the morning and 120 in the afternoon.     ROS     Review of Systems   Constitutional:  Negative for fatigue, fever and unexpected weight  change.   HENT:  Positive for postnasal drip. Negative for congestion.    Respiratory:  Positive for cough. Negative for shortness of breath and wheezing.    Cardiovascular:  Negative for chest pain.   Gastrointestinal:  Negative for abdominal pain, diarrhea, nausea and vomiting.   Neurological:  Negative for dizziness and headaches.           The following portions of the patient's history were reviewed and updated as appropriate: Allergies, Current Medications, Past Family History, Past Medical history, Past social history, Past surgical history, and Problem List.    PHYSICAL EXAM     There were no vitals filed for this visit.    Results for orders placed or performed in visit on 05/16/21   POCT Hemoglobin A1C   Result Value Ref Range    POCT Hgb A1C 8.1 (A) 3.9 - 5.9 %       Physical Exam  Vitals and nursing note reviewed.   Constitutional:       General: She is not in acute distress.     Appearance: Normal appearance. She is well-developed.   HENT:      Head: Normocephalic and atraumatic.   Cardiovascular:      Rate and Rhythm: Normal rate and regular rhythm.      Chest Wall: PMI is not displaced.      Heart sounds: Normal heart sounds.   Pulmonary:      Effort: Pulmonary effort is normal.      Breath sounds: Normal breath sounds and air entry.   Skin:     General: Skin is warm and dry.      Capillary Refill: Capillary refill takes less than 2 seconds.   Neurological:      Mental Status: She is alert and oriented to person, place, and time.   Psychiatric:         Mood and Affect: Mood normal.         Speech: Speech normal.         Behavior: Behavior normal.           ASSESSMENT/PLAN  1. Moderate persistent asthma with acute exacerbation  fluticasone (FLOVENT HFA) 220 MCG/ACT inhaler    albuterol sulfate HFA (PROVENTIL) 108 (90 Base) MCG/ACT inhaler    albuterol (PROVENTIL) (2.5 MG/3ML) 0.083% nebulizer solution  Xray as ordered. Increase flovent from to . Albuterol PRN. Continue with mucinex.        2. COVID-19        3. Cough  X-ray chest PA and lateral      4. Type 2 diabetes mellitus without complication, without long-term current use of insulin  POCT Hemoglobin A1C  Chronic, uncontrolled. Continue current meds. Meeting with nutritionist at the end of the week and has appt scheduled with endo.           Reviewed med use and side effects. Reviewed s/s that would warrant further and/ or immediate medical attention. Pt in agreement with plan and all questions answered.       Risk & Benefits of the new medication(s) were explained to the patient (and family) who verbalized understanding & agreed to the treatment plan. Patient (family) encouraged to contact me/clinical staff with any questions/concerns      Return in about 3 months (around 08/15/2021) for f/u AND as needed.    Signed,  Myer Haff, FNP, FNP  05/16/2021

## 2021-05-17 ENCOUNTER — Ambulatory Visit (INDEPENDENT_AMBULATORY_CARE_PROVIDER_SITE_OTHER): Payer: 59 | Admitting: Registered"

## 2021-05-17 LAB — URINE CULTURE
MICRO NUMBER:: 12366780
SPECIMEN QUALITY:: ADEQUATE

## 2021-05-18 ENCOUNTER — Other Ambulatory Visit: Payer: Self-pay

## 2021-05-18 ENCOUNTER — Encounter: Payer: Self-pay | Admitting: Orthopedic Surgery

## 2021-05-18 ENCOUNTER — Ambulatory Visit (INDEPENDENT_AMBULATORY_CARE_PROVIDER_SITE_OTHER): Payer: Medicare Other | Admitting: Orthopedic Surgery

## 2021-05-18 ENCOUNTER — Telehealth (HOSPITAL_BASED_OUTPATIENT_CLINIC_OR_DEPARTMENT_OTHER): Payer: Self-pay

## 2021-05-18 ENCOUNTER — Encounter (INDEPENDENT_AMBULATORY_CARE_PROVIDER_SITE_OTHER): Payer: Self-pay | Admitting: Family

## 2021-05-18 VITALS — Ht 65.5 in | Wt 174.0 lb

## 2021-05-18 DIAGNOSIS — S72001D Fracture of unspecified part of neck of right femur, subsequent encounter for closed fracture with routine healing: Secondary | ICD-10-CM

## 2021-05-18 LAB — COMPLETE METABOLIC PANEL WITH GFR
AG Ratio: 1.8 (calc) (ref 1.0–2.5)
ALT: 12 U/L (ref 6–29)
AST: 18 U/L (ref 10–35)
Albumin: 4.2 g/dL (ref 3.6–5.1)
Alkaline phosphatase (APISO): 60 U/L (ref 37–153)
BUN/Creatinine Ratio: 17 (calc) (ref 6–22)
BUN: 18 mg/dL (ref 7–25)
CO2: 29 mmol/L (ref 20–32)
Calcium: 9.8 mg/dL (ref 8.6–10.4)
Chloride: 102 mmol/L (ref 98–110)
Creat: 1.09 mg/dL — ABNORMAL HIGH (ref 0.60–1.00)
Globulin: 2.3 g/dL (calc) (ref 1.9–3.7)
Glucose, Bld: 100 mg/dL — ABNORMAL HIGH (ref 65–99)
Potassium: 4.3 mmol/L (ref 3.5–5.3)
Sodium: 140 mmol/L (ref 135–146)
Total Bilirubin: 0.8 mg/dL (ref 0.2–1.2)
Total Protein: 6.5 g/dL (ref 6.1–8.1)
eGFR: 53 mL/min/{1.73_m2} — ABNORMAL LOW (ref >=60)

## 2021-05-18 LAB — CBC WITH DIFFERENTIAL/PLATELET
Absolute Monocytes: 707 cells/uL (ref 200–950)
Basophils Absolute: 63 cells/uL (ref 0–200)
Basophils Relative: 0.9 %
Eosinophils Absolute: 546 cells/uL — ABNORMAL HIGH (ref 15–500)
Eosinophils Relative: 7.8 %
HCT: 41 % (ref 35.0–45.0)
Hemoglobin: 13.6 g/dL (ref 11.7–15.5)
Lymphs Abs: 1624 cells/uL (ref 850–3900)
MCH: 31.3 pg (ref 27.0–33.0)
MCHC: 33.2 g/dL (ref 32.0–36.0)
MCV: 94.5 fL (ref 80.0–100.0)
MPV: 10.3 fL (ref 7.5–12.5)
Monocytes Relative: 10.1 %
Neutro Abs: 4060 cells/uL (ref 1500–7800)
Neutrophils Relative %: 58 %
Platelets: 201 10*3/uL (ref 140–400)
RBC: 4.34 10*6/uL (ref 3.80–5.10)
RDW: 11.7 % (ref 11.0–15.0)
Total Lymphocyte: 23.2 %
WBC: 7 10*3/uL (ref 3.8–10.8)

## 2021-05-18 LAB — TEST AUTHORIZATION

## 2021-05-18 LAB — MAGNESIUM: Magnesium: 1.5 mg/dL (ref 1.5–2.5)

## 2021-05-18 NOTE — Progress Notes (Signed)
Chief Complaint  Patient presents with   Follow-up    Recheck on right hip, DOS 11-13-20.   S/p bipolar for fracture   April Beck continues to improve she has some pain on her right greater trochanter when she lies on the right side  She uses her cane occasionally  I tested her abductors they still have some weakness so I recommended she do 3 months of abductor strengthening and follow-up in 3 months if she can  She says she usually goes to [place] for 3 months in the wintertime to visit her son so she might schedule it around that vacation

## 2021-05-18 NOTE — Telephone Encounter (Signed)
Patient has tried to complete her dm class one three times and every time she is unable to complete the class virtually. It says she is checked in but she cannot see anything the screen is black. Patient stated she clicked on the link and nothing happens.      Patient would like to see what can be done if she can do an in person with one of the educators or one on one through my chart.. Patient has called and had to reschedule multiple times for her class one and would really like to get this done. Please advise.     Best call back # [phone]      Initial visit 04/27/21 via my chart

## 2021-05-19 ENCOUNTER — Encounter: Payer: Self-pay | Admitting: Family Medicine

## 2021-05-19 NOTE — Telephone Encounter (Signed)
Call placed to patient.   Advised that Magnesium is WNL, but on the lower side. Advised that she can continue the supplement.   Advised that contamination is the mixed urogenital flora seen on UA's. Advised to stip ABTx.

## 2021-05-19 NOTE — Progress Notes (Signed)
Patient joined class through mas chart by error.

## 2021-05-25 ENCOUNTER — Ambulatory Visit (INDEPENDENT_AMBULATORY_CARE_PROVIDER_SITE_OTHER): Payer: 59 | Admitting: Registered"

## 2021-05-25 ENCOUNTER — Ambulatory Visit
Admission: RE | Admit: 2021-05-25 | Discharge: 2021-05-25 | Disposition: A | Discharge status: Home / Self Care | Payer: 59 | Source: Ambulatory Visit | Attending: Family | Admitting: Family

## 2021-05-25 DIAGNOSIS — R059 Cough, unspecified: Secondary | ICD-10-CM | POA: Insufficient documentation

## 2021-05-26 ENCOUNTER — Telehealth (HOSPITAL_BASED_OUTPATIENT_CLINIC_OR_DEPARTMENT_OTHER): Payer: 59

## 2021-06-01 ENCOUNTER — Other Ambulatory Visit: Payer: Self-pay | Admitting: Family Medicine

## 2021-06-01 ENCOUNTER — Other Ambulatory Visit (INDEPENDENT_AMBULATORY_CARE_PROVIDER_SITE_OTHER): Payer: Self-pay | Admitting: Family

## 2021-06-09 ENCOUNTER — Emergency Department: Payer: 59

## 2021-06-09 ENCOUNTER — Emergency Department
Admission: EM | Admit: 2021-06-09 | Discharge: 2021-06-09 | Disposition: A | Discharge status: Home / Self Care | Payer: 59 | Attending: Emergency Medicine | Admitting: Emergency Medicine

## 2021-06-09 ENCOUNTER — Telehealth (HOSPITAL_BASED_OUTPATIENT_CLINIC_OR_DEPARTMENT_OTHER): Payer: 59

## 2021-06-09 DIAGNOSIS — M19041 Primary osteoarthritis, right hand: Secondary | ICD-10-CM | POA: Insufficient documentation

## 2021-06-09 DIAGNOSIS — M19042 Primary osteoarthritis, left hand: Secondary | ICD-10-CM | POA: Insufficient documentation

## 2021-06-09 MED ORDER — DICLOFENAC SODIUM 1 % EX GEL
2.0000 g | Freq: Four times a day (QID) | CUTANEOUS | 0 refills | Status: DC
Start: 2021-06-09 — End: 2023-05-28

## 2021-06-09 NOTE — ED Notes (Signed)
Pt AA&Ox4, answering questions appropriately, able to speak in complete sentences, speech clear, resp even & unlabored, no accessory muscle use noted, ambulatory w/ steady gait, in NAD. Family member at bedside. Bed in lowest position, call bell w/i reach and explained to pt, room/floor free of debris/clutter. Pt denies further needs at this time. Will continue to monitor.

## 2021-06-09 NOTE — Discharge Instructions (Addendum)
You were seen in the emergency department for pain of your index fingers of bilateral hands.    There was no evidence of pathological fracture or dislocation seen on your x-ray.    Please continue supportive care with applying Voltaren gel on your hands for pain management and take ibuprofen every 6 hours in addition to Tylenol as needed for pain.    Please follow-up with your primary care doctor soon as possible

## 2021-06-09 NOTE — ED Triage Notes (Signed)
Pt arrives ambulatory through triage w/ family member via private vehicle from home w/ c/o  swelling and pain to base of bilateral 2nd fingers x 1 week. Pt reports pain is severe when holding/gripping. Pt reports has hx of arthritis to hands/fingers and has had similar pain in the past but not this severe. Pt taking advil w/ minimal relief, last dose x 5 days ago.

## 2021-06-09 NOTE — ED Provider Notes (Signed)
EMERGENCY DEPARTMENT NOTE     Patient initially seen and examined at   ED PHYSICIAN ASSIGNED       None           ED MIDLEVEL (APP) ASSIGNED       Date/Time Event User Comments    06/09/21 1252 PA/NP Provider Assigned Murrell Converse, Jencarlo Bonadonna Sheldon Silvan, DNP FNP assigned as Nurse Practitioner            HISTORY OF PRESENT ILLNESS   Historian:Patient  Translator Used: no    Chief Complaint: Hand Pain       75 y.o. female with a hx of HTN, T2DM, and arthritis presents with tenderness of bilateral index fingers x1 week. Patient states she is very active and plays with her grandchildren. However, she has not been able to pick up objects due to limited ROM of her fingers.  Only took ibuprofen earlier on Monday with minimal improvement.  Denies numbness tingling, trauma or injury.    Location of symptoms: Bilateral index fingers  Onset of symptoms: 1 week  What was patient doing when symptoms started (Context): see above  Severity: moderate  Timing: Acute  Activities that worsen symptoms: Movement palpation  Activities that improve symptoms: None  Quality: Tender, achy  Radiation of symptoms: no  Associated signs and Symptoms: see above  Are symptoms worsening? 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    Bilateral cataracts     Breast cancer     Breast lump     Cancer     breast cancer    Carcinoma     Carcinoma of the bladder    Chicken pox     Childhood illness    Conduction Disorder     RBBB    Diabetes mellitus type II     Non-Insulin dependent    Echocardiogram 10/2013    Gastroesophageal reflux disease     Hemorrhoids without complication     Holter monitor 02/2014    Hyperlipidemia     Hypertensive disorder     Malignant neoplasm of breast     Measles      Childhood illness    Mumps     Childhood illness    Myocardial perfusion scan 11/2013, 12/2013    MPI single Isotope excrise; MPI dual Isotope Lexiscan    RBBB     Syncope        Past Surgical History:  Past Surgical History:   Procedure Laterality Date    ARTHROSCOPY, KNEE Left 04/21/2015    Procedure: ARTHROSCOPY, KNEE PARTIAL MEDIAL AND LATERAL MENISECTOMY LEFT KNEE;  Surgeon: Bess Kinds, MD;  Location: [facility];  Service: Orthopedics;  Laterality: Left;  partial medial menisectomy    BREAST BIOPSY      HYSTERECTOMY      total    TUMOR REMOVAL Left     neck  benign        Social History:  Social History     Socioeconomic History    Marital status: Legally Separated  Tobacco Use    Smoking status: Never    Smokeless tobacco: Never   Vaping Use    Vaping Use: Never used   Substance and Sexual Activity    Alcohol use: No    Drug use: No    Sexual activity: Not Currently       Family History:  Family History   Problem Relation Age of Onset    Diabetes Mother     Diabetes Sister     Diabetes Sister     Malignant hyperthermia Neg Hx     Pseudochol deficiency Neg Hx     Anesthesia problems Neg Hx        Outpatient Medication:  Discharge Medication List as of 06/09/2021  2:17 PM        CONTINUE these medications which have NOT CHANGED    Details   albuterol (PROVENTIL) (2.5 MG/3ML) 0.083% nebulizer solution Take 3 mLs (2.5 mg total) by nebulization every 4 (four) hours as needed for Shortness of Breath or Wheezing, Starting Tue 05/16/2021, E-Rx      albuterol sulfate HFA (PROVENTIL) 108 (90 Base) MCG/ACT inhaler Inhale 2 puffs into the lungs every 4 (four) hours as needed for Wheezing or Shortness of Breath, Starting Tue 05/16/2021, Until Wed 05/16/2022 at 2359, E-Rx      amLODIPine (NORVASC) 10 MG tablet Take 1 tablet (10 mg total) by mouth daily, Starting Wed 10/12/2020, E-Rx      atorvastatin (LIPITOR) 40 MG tablet Take 1 tablet (40 mg total) by mouth daily, Starting Mon 10/24/2020, E-Rx      Blood Glucose Monitoring  Suppl (ONE TOUCH ULTRA 2) W/DEVICE KIT One daily, Print      Cholecalciferol (Vitamin D3) 50 MCG (2000 UT) Tab Take 1 tablet by mouth daily, Starting Tue 02/28/2021, E-Rx      desonide (DESOWEN) 0.05 % cream 1 APPLICATION APPLY ON THE SKIN TWICE A DAY APPLY TO FACE TWICE DAILY., Historical Med      EPINEPHrine (EPIPEN 2-PAK) 0.3 MG/0.3ML Solution Auto-injector injection Inject 0.3 mLs (0.3 mg total) into the muscle once as needed (Anaphylaxis), Starting Mon 12/12/2020, Until Tue 12/12/2021 at 2359, E-Rx      ferrous sulfate 325 (65 FE) MG tablet ferrous sulfate 325 mg (65 mg iron) tablet, Historical Med      fluticasone (FLONASE) 50 MCG/ACT nasal spray Starting 06/02/2014, Until Discontinued, Historical Med      fluticasone (FLOVENT HFA) 220 MCG/ACT inhaler Inhale 1 puff into the lungs 2 (two) times daily, Starting Tue 05/16/2021, E-Rx      glipiZIDE (GLUCOTROL) 10 MG tablet TAKE 2 TABLETS (20 MG TOTAL) BY MOUTH 2 (TWO) TIMES DAILY BEFORE MEALS, Starting Wed 05/03/2021, Until Tue 08/01/2021, E-Rx      glucose blood (OneTouch Ultra) test strip 2 times daily, E-Rx      Lancets (onetouch ultrasoft) lancets Use to check blood sugars 2 times daily, Print      lidocaine (Lidoderm) 5 % Place 1 patch onto the skin daily as needed (pain) Remove & Discard patch within 12 hours, Starting Tue 09/13/2020, E-Rx      metFORMIN (GLUCOPHAGE) 1000 MG tablet Take 1 tablet (1,000 mg total) by mouth 2 (two) times daily, Starting Thu 11/03/2020, E-Rx      montelukast (SINGULAIR) 10 MG tablet Take 10 mg by mouth daily, Starting Fri 03/19/2014, Historical Med      NASONEX 50 MCG/ACT nasal spray as needed.   , Starting 12/28/2013, Until Discontinued, Historical Med  pantoprazole (PROTONIX) 40 MG tablet Take 1 tablet (40 mg total) by mouth daily, Starting Wed 10/12/2020, E-Rx      Tradjenta 5 MG Tab TAKE 1 TABLET (5 MG TOTAL) BY MOUTH DAILY., Starting Thu 06/01/2021, E-Rx      valsartan-hydroCHLOROthiazide (DIOVAN-HCT) 160-12.5 MG per tablet Take 1  tablet by mouth daily, Starting Thu 02/16/2021, E-Rx               REVIEW OF SYSTEMS   Review of Systems     10/14 Review of Systems completed and negative except as stated above in the HPI   Reviewed: Constitutional, HENT, Eyes, Resp, CV, GI, GU, MSK, Skin, Neuro, Psych    PHYSICAL EXAM     ED Triage Vitals   Enc Vitals Group      BP       Pulse       Resp       Temp       Temp src       SpO2       Weight       Height       Head Circumference       Peak Flow       Pain Score       Pain Loc       Pain Edu?       Excl. in GC?      Vitals:    06/09/21 1258   BP: 155/69   Pulse: 91   Resp: 16   Temp: 97.4 F (36.3 C)   TempSrc: Oral   SpO2: 99%   Weight: 72.1 kg   Height: 5\' 2"  (1.575 m)       Nursing note and vitals reviewed.   Constitutional:  No acute distress.  Head:  Atraumatic.  Normocephalic.   Eyes:  Normal sclera.  PERRL.    ENT:  Moist mucosa.     Cardiovascular:  Well perfused.  Equal pulses.  Regular rate.  Normal capillary refill.    Pulmonary/Chest:  No respiratory distress.  Airway patent.  No tachypnea.  No accessory muscle usage.    Abdominal:  Non-distended.    Extremities:  No peripheral edema. Moves all extremities equally.  Skin:  Normal color. No cyanosis or pallor.  Neurological:  Alert, awake, and appropriate.  Normal speech.    Psychological: Normal affect.        MEDICAL DECISION MAKING     DISCUSSION       75 yo female with a hx of arthritis was evaluated for an achy pain of bilateral index fingers.     Differential diagnosis includes but not limited to RA, OA, strain, gouty arthritis, less likely fractures.     Patient requesting fractures as she is concerned about pathological fractures of her fingers. Declined pain medication.     XRs unremarkable for acute fractures     Topical Voltaren gel Rx given. Results discussed with patient. Recommended continuing supportive care and PCP follow up. Strict return precautions discussed with patient verbalizing understanding. All questions answered.  Patient clinically stable to d/c home.         I do not suspect severe sepsis or septic shock    Vital Signs: Reviewed the patients vital signs.   Nursing Notes: Reviewed and utilized available nursing notes.  Medical Records Reviewed: Reviewed available past medical records.  Counseling: The emergency provider has spoken with the patient and discussed todays findings, in addition to providing specific details for  the plan of care.  Questions are answered and there is agreement with the plan.    CARDIAC STUDIES    The following cardiac studies were independently interpreted by the Emergency Medicine Physician.  For full cardiac study results please see chart.    EMERGENCY IMAGING STUDIES    The following imagine studies were independently interpreted by me (emergency physician):    Radiology:  Exam interpreted by me Murrell Converse)  Exam: right hand  Findings: No fracture. No Dislocation. No foreign body.  Impression: No acute abnormality.     Radiology:  Exam interpreted by me Murrell Converse)  Exam: left hand  Findings: No fracture. No Dislocation. No foreign body.  Impression: No acute abnormality.     RADIOLOGY IMAGING STUDIES      Hand Right PA Lateral and Oblique   Final Result       No acute fracture or dislocation.      Nelya Ebadirad    06/09/2021 1:50 PM      Hand Left PA Lateral and Oblique   Final Result     No acute bony abnormality.      Wyatt Portela, MD    06/09/2021 1:49 PM            PULSE OXIMETRY    Oxygen Saturation by Pulse Oximetry: 99%  Interventions: none  Interpretation:  stable    EMERGENCY DEPT. MEDICATIONS      ED Medication Orders (From admission, onward)      None            LABORATORY RESULTS    Ordered and independently interpreted AVAILABLE laboratory tests. Please see results section in chart for full details.  Results for orders placed or performed in visit on 05/16/21   POCT Hemoglobin A1C   Result Value Ref Range    POCT Hgb A1C 8.1 (A) 3.9 - 5.9 %       CRITICAL CARE/PROCEDURES     Procedures    DIAGNOSIS      Diagnosis:  Final diagnoses:   Arthritis of finger of both hands       Disposition:  ED Disposition       ED Disposition   Discharge    Condition   --    Date/Time   Fri Jun 09, 2021  2:16 PM    Comment   Helen [name] discharge to home/self care.    Condition at disposition: Stable                 Prescriptions:  Discharge Medication List as of 06/09/2021  2:17 PM        START taking these medications    Details   diclofenac Sodium (VOLTAREN) 1 % Gel topical gel Apply 2 g topically 4 (four) times daily, Starting Fri 06/09/2021, E-Rx           CONTINUE these medications which have NOT CHANGED    Details   albuterol (PROVENTIL) (2.5 MG/3ML) 0.083% nebulizer solution Take 3 mLs (2.5 mg total) by nebulization every 4 (four) hours as needed for Shortness of Breath or Wheezing, Starting Tue 05/16/2021, E-Rx      albuterol sulfate HFA (PROVENTIL) 108 (90 Base) MCG/ACT inhaler Inhale 2 puffs into the lungs every 4 (four) hours as needed for Wheezing or Shortness of Breath, Starting Tue 05/16/2021, Until Wed 05/16/2022 at 2359, E-Rx      amLODIPine (NORVASC) 10 MG tablet Take 1 tablet (10 mg total) by mouth daily, Starting Wed 10/12/2020, E-Rx  atorvastatin (LIPITOR) 40 MG tablet Take 1 tablet (40 mg total) by mouth daily, Starting Mon 10/24/2020, E-Rx      Blood Glucose Monitoring Suppl (ONE TOUCH ULTRA 2) W/DEVICE KIT One daily, Print      Cholecalciferol (Vitamin D3) 50 MCG (2000 UT) Tab Take 1 tablet by mouth daily, Starting Tue 02/28/2021, E-Rx      desonide (DESOWEN) 0.05 % cream 1 APPLICATION APPLY ON THE SKIN TWICE A DAY APPLY TO FACE TWICE DAILY., Historical Med      EPINEPHrine (EPIPEN 2-PAK) 0.3 MG/0.3ML Solution Auto-injector injection Inject 0.3 mLs (0.3 mg total) into the muscle once as needed (Anaphylaxis), Starting Mon 12/12/2020, Until Tue 12/12/2021 at 2359, E-Rx      ferrous sulfate 325 (65 FE) MG tablet ferrous sulfate 325 mg (65 mg iron) tablet, Historical Med      fluticasone  (FLONASE) 50 MCG/ACT nasal spray Starting 06/02/2014, Until Discontinued, Historical Med      fluticasone (FLOVENT HFA) 220 MCG/ACT inhaler Inhale 1 puff into the lungs 2 (two) times daily, Starting Tue 05/16/2021, E-Rx      glipiZIDE (GLUCOTROL) 10 MG tablet TAKE 2 TABLETS (20 MG TOTAL) BY MOUTH 2 (TWO) TIMES DAILY BEFORE MEALS, Starting Wed 05/03/2021, Until Tue 08/01/2021, E-Rx      glucose blood (OneTouch Ultra) test strip 2 times daily, E-Rx      Lancets (onetouch ultrasoft) lancets Use to check blood sugars 2 times daily, Print      lidocaine (Lidoderm) 5 % Place 1 patch onto the skin daily as needed (pain) Remove & Discard patch within 12 hours, Starting Tue 09/13/2020, E-Rx      metFORMIN (GLUCOPHAGE) 1000 MG tablet Take 1 tablet (1,000 mg total) by mouth 2 (two) times daily, Starting Thu 11/03/2020, E-Rx      montelukast (SINGULAIR) 10 MG tablet Take 10 mg by mouth daily, Starting Fri 03/19/2014, Historical Med      NASONEX 50 MCG/ACT nasal spray as needed.   , Starting 12/28/2013, Until Discontinued, Historical Med      pantoprazole (PROTONIX) 40 MG tablet Take 1 tablet (40 mg total) by mouth daily, Starting Wed 10/12/2020, E-Rx      Tradjenta 5 MG Tab TAKE 1 TABLET (5 MG TOTAL) BY MOUTH DAILY., Starting Thu 06/01/2021, E-Rx      valsartan-hydroCHLOROthiazide (DIOVAN-HCT) 160-12.5 MG per tablet Take 1 tablet by mouth daily, Starting Thu 02/16/2021, E-Rx             This note was generated by the Epic EMR system/ Dragon speech recognition and may contain inherent errors or omissions not intended by the user. Grammatical errors, random word insertions, deletions and pronoun errors  are occasional consequences of this technology due to software limitations. Not all errors are caught or corrected. If there are questions or concerns about the content of this note or information contained within the body of this dictation they should be addressed directly with the author for clarification.     Sheldon Silvan, DNP  FNP  06/09/21 [phone]

## 2021-06-09 NOTE — ED Notes (Signed)
Pt condition favorable for discharge, resp remain even & unlabored, ambulatory w/ steady gait, in NAD. Verbal and written discharge instructions reviewed w/ pt. Patient informed of signs and symptoms that warrant seeking further evaluation/treatment. Pt states has no further questions. Pt family member present for dispo.

## 2021-06-12 ENCOUNTER — Telehealth (INDEPENDENT_AMBULATORY_CARE_PROVIDER_SITE_OTHER): Payer: Self-pay | Admitting: Family

## 2021-06-12 NOTE — Telephone Encounter (Signed)
Attempted to call patient for ED follow up visit. LMTCB.

## 2021-06-13 ENCOUNTER — Telehealth (HOSPITAL_BASED_OUTPATIENT_CLINIC_OR_DEPARTMENT_OTHER): Payer: Self-pay | Admitting: Registered Dietitian Nutritionist

## 2021-06-19 ENCOUNTER — Ambulatory Visit (INDEPENDENT_AMBULATORY_CARE_PROVIDER_SITE_OTHER): Payer: Medicare Other | Admitting: *Deleted

## 2021-06-19 ENCOUNTER — Other Ambulatory Visit: Payer: Self-pay

## 2021-06-19 DIAGNOSIS — Z23 Encounter for immunization: Secondary | ICD-10-CM | POA: Diagnosis not present

## 2021-06-20 ENCOUNTER — Encounter (INDEPENDENT_AMBULATORY_CARE_PROVIDER_SITE_OTHER): Payer: Self-pay | Admitting: Family

## 2021-06-20 ENCOUNTER — Ambulatory Visit (INDEPENDENT_AMBULATORY_CARE_PROVIDER_SITE_OTHER): Payer: 59 | Admitting: Family

## 2021-06-20 VITALS — BP 131/68 | HR 77 | Temp 97.0°F

## 2021-06-20 DIAGNOSIS — M2540 Effusion, unspecified joint: Secondary | ICD-10-CM

## 2021-06-20 DIAGNOSIS — M2550 Pain in unspecified joint: Secondary | ICD-10-CM

## 2021-06-20 DIAGNOSIS — Z23 Encounter for immunization: Secondary | ICD-10-CM

## 2021-06-20 MED ORDER — MELOXICAM 7.5 MG PO TABS
7.5000 mg | ORAL_TABLET | Freq: Every day | ORAL | 0 refills | Status: DC
Start: 2021-06-20 — End: 2021-06-26

## 2021-06-20 NOTE — Progress Notes (Signed)
Have you seen any specialists/other providers since your last visit with [place]?    No    Do you agree to telemedicine visit?  No    Arm preference verified?   Yes, no preference    Health Maintenance Due   Topic Date Due    Advance Directive on File  Never done    Shingrix Vaccine 50+ (1) Never done    DEPRESSION SCREENING  Never done    HEPATITIS C SCREENING  Never done    COVID-19 Vaccine (4 - Booster for Moderna series) 12/02/2020

## 2021-06-20 NOTE — Progress Notes (Signed)
[facility]  Laurell Josephs  PROGRESS NOTE      Patient: Helen [name]   Date: 06/20/2021   MRN: [id number]     Helen [name] is a 75 y.o. female    Chief Complaint   Patient presents with    Arthritis     Pt having trouble lifting objects, pointer fingers are in significant pain- 9/10.       MEDICATIONS     Current Outpatient Medications   Medication Sig Dispense Refill    albuterol (PROVENTIL) (2.5 MG/3ML) 0.083% nebulizer solution Take 3 mLs (2.5 mg total) by nebulization every 4 (four) hours as needed for Shortness of Breath or Wheezing 75 mL 1    albuterol sulfate HFA (PROVENTIL) 108 (90 Base) MCG/ACT inhaler Inhale 2 puffs into the lungs every 4 (four) hours as needed for Wheezing or Shortness of Breath 1 each 2    amLODIPine (NORVASC) 10 MG tablet Take 1 tablet (10 mg total) by mouth daily 90 tablet 3    atorvastatin (LIPITOR) 40 MG tablet Take 1 tablet (40 mg total) by mouth daily 90 tablet 1    Blood Glucose Monitoring Suppl (ONE TOUCH ULTRA 2) W/DEVICE KIT One daily 1 each 0    Cholecalciferol (Vitamin D3) 2000 UNIT capsule Take by mouth daily      Cholecalciferol (Vitamin D3) 50 MCG (2000 UT) Tab Take 1 tablet by mouth daily 90 tablet 3    desonide (DESOWEN) 0.05 % cream 1 APPLICATION APPLY ON THE SKIN TWICE A DAY APPLY TO FACE TWICE DAILY.      diclofenac Sodium (VOLTAREN) 1 % Gel topical gel Apply 2 g topically 4 (four) times daily 50 g 0    EPINEPHrine (EPIPEN 2-PAK) 0.3 MG/0.3ML Solution Auto-injector injection Inject 0.3 mLs (0.3 mg total) into the muscle once as needed (Anaphylaxis) 1 each 0    ferrous sulfate 325 (65 FE) MG tablet ferrous sulfate 325 mg (65 mg iron) tablet      fluticasone (FLONASE) 50 MCG/ACT nasal spray       fluticasone (FLOVENT HFA) 220 MCG/ACT inhaler Inhale 1 puff into the lungs 2 (two) times daily 1 each 2    glipiZIDE (GLUCOTROL) 10 MG tablet TAKE 2 TABLETS (20 MG TOTAL) BY MOUTH 2 (TWO) TIMES DAILY BEFORE MEALS 360 tablet 0    glucose blood (OneTouch Ultra) test strip 2  times daily 200 each 3    Lancets (onetouch ultrasoft) lancets Use to check blood sugars 2 times daily 200 each 3    lidocaine (Lidoderm) 5 % Place 1 patch onto the skin daily as needed (pain) Remove & Discard patch within 12 hours 30 patch 0    metFORMIN (GLUCOPHAGE) 1000 MG tablet Take 1 tablet (1,000 mg total) by mouth 2 (two) times daily 180 tablet 1    montelukast (SINGULAIR) 10 MG tablet Take 10 mg by mouth daily      NASONEX 50 MCG/ACT nasal spray as needed.     5    pantoprazole (PROTONIX) 40 MG tablet Take 1 tablet (40 mg total) by mouth daily 90 tablet 3    Tradjenta 5 MG Tab TAKE 1 TABLET (5 MG TOTAL) BY MOUTH DAILY. 90 tablet 1    valsartan-hydroCHLOROthiazide (DIOVAN-HCT) 160-12.5 MG per tablet Take 1 tablet by mouth daily 90 tablet 3    meloxicam (MOBIC) 7.5 MG tablet Take 1 tablet (7.5 mg total) by mouth daily 90 tablet 0     No current facility-administered medications for this  visit.       Allergies   Allergen Reactions    Aspirin      Break out in sweat and dizziness     Peppers     Pollen Extract     Latex Itching and Rash       SUBJECTIVE     Chief Complaint   Patient presents with    Arthritis     Pt having trouble lifting objects, pointer fingers are in significant pain- 9/10.      HPI  Pt presenting today as ER follow up for hand pain. States about 2 weeks ago hands really started to hurt and she has been having swelling mainly in joints of index fingers BL. Reports she can't bend her fingers as much as she should be. She does have known arthritis in her knees and back as well.    States pain and swelling are not improving. Taking advil every 4 hours.     Xrays done at ER of both hands  Lumbar CT done earlier this year    ROS     Review of Systems   Constitutional:  Negative for fatigue and fever.   Musculoskeletal:  Positive for arthralgias, back pain and joint swelling.   Skin:  Negative for color change and wound.   Neurological:  Negative for weakness and numbness.           The following  portions of the patient's history were reviewed and updated as appropriate: Allergies, Current Medications, Past Family History, Past Medical history, Past social history, Past surgical history, and Problem List.    PHYSICAL EXAM     Vitals:    06/20/21 1040   BP: 131/68   BP Site: Left arm   Patient Position: Sitting   Cuff Size: Medium   Pulse: 77   Temp: 97 F (36.1 C)   TempSrc: Temporal   SpO2: 98%       Physical Exam  Vitals and nursing note reviewed.   Constitutional:       General: She is awake. She is not in acute distress.     Appearance: Normal appearance.   Cardiovascular:      Pulses:           Radial pulses are 2+ on the right side and 2+ on the left side.   Pulmonary:      Effort: Pulmonary effort is normal.   Musculoskeletal:      Right hand: Swelling and deformity present. No bony tenderness. Decreased range of motion. Decreased strength. Normal sensation. Normal capillary refill.      Left hand: Swelling and deformity present. No bony tenderness. Decreased range of motion. Decreased strength. Normal sensation. Normal capillary refill.   Skin:     General: Skin is warm and dry.   Neurological:      Mental Status: She is alert and oriented to person, place, and time.   Psychiatric:         Mood and Affect: Mood normal.         Speech: Speech normal.         Behavior: Behavior normal.           ASSESSMENT/PLAN        1. Polyarthralgia  Rheumatoid factor    Uric acid    ANA IFA w reflex to Titer/Pattern/Antibody    Cyclic Citrullinated Peptide Abs IgG/IgA    meloxicam (MOBIC) 7.5 MG tablet      2. Joint swelling  Rheumatoid factor    Uric acid    ANA IFA w reflex to Titer/Pattern/Antibody      3. Need for vaccination  Flu Vaccine High Dose, 65 yrs        Chronic, uncontrolled symptoms. Likely osteoarthritis, however pt does not believe she has had arthritis panel done in the past. Labs as ordered. Trial of meloxicam as ordered- discussed possible sensitivity due to allergy to aspirin, but she  tolerates other NSAIDs well. Goal for lowest effective dosing discussed. Further workup and management as needed.     Reviewed med use and side effects. Reviewed s/s that would warrant further and/ or immediate medical attention. Pt in agreement with plan and all questions answered.    Ddx: osteoarthritis, RA, gout, other autoimmune     Risk & Benefits of the new medication(s) were explained to the patient (and family) who verbalized understanding & agreed to the treatment plan. Patient (family) encouraged to contact me/clinical staff with any questions/concerns      Return if symptoms worsen or fail to improve.    Signed,  Myer Haff, FNP, FNP  06/20/2021

## 2021-06-21 LAB — URIC ACID: Uric acid: 3.8 mg/dL (ref 3.1–7.9)

## 2021-06-21 LAB — RHEUMATOID FACTOR: RA Latex Turbid.: <10 IU/mL (ref <14.0)

## 2021-06-22 LAB — ANA IFA W/REFLEX TO TITER/PATTERN/AB: Antinuclear Antibodies (ANA): NEGATIVE

## 2021-06-22 LAB — CCP ANTIBODIES IGG/IGA: CCP Antibodies IgG/IgA: 6 units (ref 0–19)

## 2021-06-24 ENCOUNTER — Other Ambulatory Visit: Payer: Self-pay | Admitting: Family Medicine

## 2021-06-26 ENCOUNTER — Telehealth (INDEPENDENT_AMBULATORY_CARE_PROVIDER_SITE_OTHER): Payer: Self-pay | Admitting: Family

## 2021-06-26 DIAGNOSIS — M2550 Pain in unspecified joint: Secondary | ICD-10-CM

## 2021-06-26 MED ORDER — MELOXICAM 7.5 MG PO TABS
15.0000 mg | ORAL_TABLET | Freq: Every day | ORAL | 0 refills | Status: DC
Start: 2021-06-26 — End: 2021-08-15

## 2021-06-26 NOTE — Telephone Encounter (Signed)
Pt is returning phone call from Renae today 06/26/21. She is still having back and hand pain from her last visit with PCP. Please advise    [phone]

## 2021-06-26 NOTE — Telephone Encounter (Signed)
Pt informed via phone call. Lab results reviewed as well.

## 2021-06-26 NOTE — Telephone Encounter (Signed)
Can increase meloxicam to 15mg  daily and see if that provides relief.

## 2021-08-14 ENCOUNTER — Telehealth: Payer: Self-pay

## 2021-08-14 ENCOUNTER — Other Ambulatory Visit: Payer: Self-pay | Admitting: Family Medicine

## 2021-08-14 DIAGNOSIS — R2242 Localized swelling, mass and lump, left lower limb: Secondary | ICD-10-CM | POA: Diagnosis not present

## 2021-08-14 DIAGNOSIS — M7120 Synovial cyst of popliteal space [Baker], unspecified knee: Secondary | ICD-10-CM | POA: Diagnosis not present

## 2021-08-14 DIAGNOSIS — M25562 Pain in left knee: Secondary | ICD-10-CM | POA: Diagnosis not present

## 2021-08-14 DIAGNOSIS — M7122 Synovial cyst of popliteal space [Baker], left knee: Secondary | ICD-10-CM | POA: Diagnosis not present

## 2021-08-14 DIAGNOSIS — R600 Localized edema: Secondary | ICD-10-CM | POA: Diagnosis not present

## 2021-08-14 NOTE — Telephone Encounter (Signed)
ICE AND IBUPROFEN OR TYLENOL   BIOFREEZE

## 2021-08-14 NOTE — Telephone Encounter (Signed)
Patient left message stating that she is visiting in [place], but wanted to let Dr. Aline Brochure know that she is having pain in the back side of her left knee. Stated that she is wearing a brace on the knee and if he has any suggestions that may help her until her appointment in January. She can be reached at [phone].  Please advise

## 2021-08-14 NOTE — Telephone Encounter (Signed)
Left message to advise tylenol/ ice Continue Meloxicam

## 2021-08-15 ENCOUNTER — Telehealth: Payer: Self-pay | Admitting: Orthopedic Surgery

## 2021-08-15 ENCOUNTER — Other Ambulatory Visit (INDEPENDENT_AMBULATORY_CARE_PROVIDER_SITE_OTHER): Payer: Self-pay | Admitting: Family

## 2021-08-15 DIAGNOSIS — J4541 Moderate persistent asthma with (acute) exacerbation: Secondary | ICD-10-CM

## 2021-08-15 DIAGNOSIS — E1165 Type 2 diabetes mellitus with hyperglycemia: Secondary | ICD-10-CM

## 2021-08-15 DIAGNOSIS — M2550 Pain in unspecified joint: Secondary | ICD-10-CM

## 2021-08-15 NOTE — Telephone Encounter (Signed)
Patient called to relay that she did go to an urgent care near where she is staying with son in [place] about the knee problem per previous note; states she had an ultrasound, and that she was told she has a small baker's cyst. States she signed up for their portal and when notes and reports are available, she will print them and bring them to her next appointment here with Dr Aline Brochure.

## 2021-08-17 ENCOUNTER — Ambulatory Visit: Payer: Medicare Other | Admitting: Orthopedic Surgery

## 2021-08-17 DIAGNOSIS — M7122 Synovial cyst of popliteal space [Baker], left knee: Secondary | ICD-10-CM | POA: Diagnosis not present

## 2021-08-17 DIAGNOSIS — M25561 Pain in right knee: Secondary | ICD-10-CM | POA: Diagnosis not present

## 2021-08-17 DIAGNOSIS — M25562 Pain in left knee: Secondary | ICD-10-CM | POA: Diagnosis not present

## 2021-08-18 DIAGNOSIS — M7122 Synovial cyst of popliteal space [Baker], left knee: Secondary | ICD-10-CM | POA: Diagnosis not present

## 2021-08-18 DIAGNOSIS — M25562 Pain in left knee: Secondary | ICD-10-CM | POA: Diagnosis not present

## 2021-08-18 DIAGNOSIS — M25561 Pain in right knee: Secondary | ICD-10-CM | POA: Diagnosis not present

## 2021-08-23 NOTE — Progress Notes (Deleted)
Subjective:       Patient ID: Helen [name] is a 75 y.o. female.    HPI    Helen [name] is being referred by Dr. Geronimo Running for consultation regarding diabetes.    Helen [name] is a 75 y.o. -year-old {Desc; female/female:11659} with a history of diabetes since {YEAR:22881}.    In terms of severity with regards to complications, the patient {ACTIONS; HAS/DOES NOT HAVE:19233} retinopathy, {ACTIONS; HAS/DOES NOT HAVE:19233} nephropathy, {ACTIONS; HAS/DOES NOT HAVE:19233} neuropathy, and {ACTIONS; HAS/DOES NOT HAVE:19233} cardiovascular disease.     With regards to the quality of blood sugar control, the most recent hemoglobin A1c test was 8.1 in September 2022.  The patient checks blood sugars {NUMBERS 0-12:18577} times per day.   Current anti-hyperglycemic regimen is comprised of Tradjenta 5 mg daily, metformin 1000 mg bid, Glipizide 20 mg bid.  Other medications tried in the past include:      In terms of associated symptoms at this time, the patient {ACTIONS; HAS/DOES NOT HAVE:19233} polyuria, {ACTIONS; HAS/DOES NOT HAVE:19233} blurred vision, {ACTIONS; HAS/DOES NOT HAVE:19233} paresthesias, FOR NEW PATIENTS ONLY: {HAS/DOES NOT HAVE:24588} chest pain, {HAS/DOES NOT HAVE:24588} shortness of breath, and {HAS/DOES NOT HAVE:24588} depression.  With regards to gastroparesis symptoms, the patient {ACTIONS; HAS/DOES NOT HAVE:19233} nausea and {ACTIONS; HAS/DOES NOT HAVE:19233} diarrhea    Per the patient's history, last fundoscopic exam was in {MONTHS OF THE VWUJ:81191} and last podiatry exam was {MONTHS OF THE YNWG:95621}.  Patient had a {NUMBERS 1-31:22730} lb weight change in the past {NUMBERS:29512}   months.    .   Currently the patient's diet comprises of {NUMBERS 0-10:22821} meals per day.  Prior formal diabetes education/ nutritional counseling {HAS HAS HYQ:65784} been performed.       Current frequency of hypoglycemia is   .  Symptoms include   .  The patient {DOES/DOES ONG:29528} have a medic alert  bracelet/ID that indicates diabetes status.  The patient {DOES/DOES UXL:24401} have an updated glucagon emergency kit.  The patient currently lives with       {Common ambulatory SmartLinks:19316}    Review of Systems        Objective:    Physical Exam      Prior labs: 02/16/21: ualb/Cr 0, TSH 1.26, A1c 7.9, Tchol 147, TG 90, HDL 66, LDL 64, Cr 0.8, Ca 9.7, nl LFTs, Hct 36.1; 05/16/21: POC A1c 8.1      Assessment:       75 y.o. 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    I reviewed and summarized the patient's previous medical records today, including previous relevant clinical notes, laboratory results, and imaging reports.  I obtained old records for review at this visit.        Plan:     1. Diabetes type 1 or 2 (new, worsening, stable): Will check hemoglobin A1c today.  Based on recent blood sugar pattern of elevated fasting sugars and elevated prandial/post-prandial sugars, will adjust medication/insulin as follows:   with sliding scale correction factor of  .  Check sugars before each meal and bedtime/AM fasting and 2 hours after biggest meal of day with goal fasting/pre-meal range of 80-130; goal postprandial range of 80-160.  Is due for eye exam in   ; will screen for nephropathy and check spot urine microalbumin/Cr or urine total protein/Cr.  Continue to see podiatry for foot care.  Continue carbohydrate-controlled diet/refer to diabetes educator for counseling.  Will check CMP, CBC, thyroid function tests, B12, folate, GAD antibody, IA-2 antibody, insulin antibody, insulin level, c-peptide level, 75-g OGTT, fasting glucose, gastric emptying study, coronary artery calcification.  Referrals to diabetes center, opthalmology, podiatry, nephrology, and cardiology provided at today's visit.    2. Hypertension (stable): goal blood pressure is <130/80.  Borderline controlled on  regimen of Amlodipine 10 mg daily, Valsartan/HCTZ 160-12.5 mg daily    3. Hyperlipidemia (stable): check fasting lipid panel on Lipitor 40 mg daily; goal LDL<100.  Continue low cholesterol, low saturated fat diet.    4. Overweight status/Obesity (stable): counseled patient on at least 150 min/wk of moderate-intensity exercise and consider flexibility/strength training exercises if possible.  Physical activity programs should begin slowly and build up gradually; nutritional counseling status.    5. Hypoglycemia management (stable): counseled patient on need to keep sugar source with him/her at all times, up-to-date glucagon kit, and medic-alert bracelet/indicator specifying diagnosis of diabetes    A total of {% 0-100:24843} minutes were spent with the patient which include time spent during face-to-face interaction with the patient and non face-to-face time including time spent reviewing and analyzing records, ordering tests, ordering medications, and completing documentation.  This included time spent counseling the patient on proper administration of medication/insulin, other drug therapy options, dietary recommendations, goals of diabetes therapy, and summarizing potential complications of diabetes.           Procedures

## 2021-08-28 ENCOUNTER — Other Ambulatory Visit (INDEPENDENT_AMBULATORY_CARE_PROVIDER_SITE_OTHER): Payer: Self-pay | Admitting: Family

## 2021-08-28 DIAGNOSIS — E7800 Pure hypercholesterolemia, unspecified: Secondary | ICD-10-CM

## 2021-08-31 ENCOUNTER — Encounter: Payer: Self-pay | Admitting: Family Medicine

## 2021-08-31 ENCOUNTER — Encounter: Payer: Medicare Other | Admitting: Nurse Practitioner

## 2021-08-31 DIAGNOSIS — R42 Dizziness and giddiness: Secondary | ICD-10-CM

## 2021-08-31 NOTE — Patient Instructions (Signed)
Based on what you shared with me, I feel your condition warrants further evaluation and I recommend that you be seen in a face to face visit.   NOTE: There will be NO CHARGE for this Visit   If you are having a true medical emergency please call 911.

## 2021-08-31 NOTE — Progress Notes (Signed)
Virtual Visit Consent   April Beck, you are scheduled for a virtual visit with a [facility] provider today.     Just as with appointments in the office, your consent must be obtained to participate.  Your consent will be active for this visit and any virtual visit you may have with one of our providers in the next 365 days.     If you have a MyChart account, a copy of this consent can be sent to you electronically.  All virtual visits are billed to your insurance company just like a traditional visit in the office.    As this is a virtual visit, video technology does not allow for your provider to perform a traditional examination.  This may limit your provider's ability to fully assess your condition.  If your provider identifies any concerns that need to be evaluated in person or the need to arrange testing (such as labs, EKG, etc.), we will make arrangements to do so.     Although advances in technology are sophisticated, we cannot ensure that it will always work on either your end or our end.  If the connection with a video visit is poor, the visit may have to be switched to a telephone visit.  With either a video or telephone visit, we are not always able to ensure that we have a secure connection.     I need to obtain your verbal consent now.   Are you willing to proceed with your visit today?    April Beck has provided verbal consent on 08/31/2021 for a virtual visit (video or telephone).   Gildardo Pounds, NP   Date: 08/31/2021 8:20 PM   Virtual Visit via Video Note   I, Gildardo Pounds, connected with  April Beck  ([id number], August 13, 1946) on 08/31/21 at  7:30 PM EST by a video-enabled telemedicine application and verified that I am speaking with the correct person using two identifiers.  Location: Patient: Virtual Visit Location Patient: Home Provider: Virtual Visit Location Provider: Home Office   I discussed the limitations of evaluation and management by  telemedicine and the availability of in person appointments. The patient expressed understanding and agreed to proceed.    History of Present Illness: April Beck is a 75 y.o. who identifies as a female who was assigned female at birth, and is being seen today for VERTIGO.  HPI:  She is in [place] visiting until January. 2 weeks ago she experienced an episode of dizziness and vomiting. Symptoms resolved thereafter a few days later. Last night the symptoms re occurred. She left like the room was spinning. Symptoms can last several minutes to hours and not related to any certain position. Denies headache, visual changes, UTI symptoms or fever. She does endorse a history of inner ear issues several years ago.   Problems:  Patient Active Problem List   Diagnosis Date Noted   Ovarian cyst 01/16/2021   Chronic constipation 11/21/2020   CKD (chronic kidney disease) stage 3, GFR 30-59 ml/min (HCC) 11/21/2020   Herpes 11/21/2020   Hypokalemia 11/21/2020   Acute blood loss anemia 11/21/2020   Weakness 11/17/2020   Aspiration into airway    Fall    Closed fracture of right hip ([facility]) 11/12/2020   Dysphagia, unspecified(787.20) 06/14/2013   Heel pain 06/14/2013   Hypercholesteremia    Hypertension    GERD (gastroesophageal reflux disease)    Hypothyroidism    Renal insufficiency 06/19/2012    Allergies:  No Known Allergies Medications:  Current Outpatient Medications:    acetaminophen (TYLENOL) 325 MG tablet, Take 2 tablets (650 mg total) by mouth every 6 (six) hours as needed for pain., Disp: , Rfl:    acyclovir (ZOVIRAX) 400 MG tablet, TAKE 1 TABLET EVERY DAY, Disp: 90 tablet, Rfl: 3   atorvastatin (LIPITOR) 40 MG tablet, TAKE 1 TABLET EVERY DAY, Disp: 90 tablet, Rfl: 3   calcium-vitamin D (OSCAL WITH D) 250-125 MG-UNIT tablet, Take 1 tablet by mouth daily., Disp: , Rfl:    cephALEXin (KEFLEX) 500 MG capsule, Take 1 capsule (500 mg total) by mouth 3 (three) times daily., Disp: 21  capsule, Rfl: 0   citalopram (CELEXA) 10 MG tablet, TAKE 1 TABLET EVERY DAY, Disp: 90 tablet, Rfl: 3   diphenoxylate-atropine (LOMOTIL) 2.5-0.025 MG tablet, Take 1 tablet by mouth 4 (four) times daily as needed for diarrhea or loose stools., Disp: 30 tablet, Rfl: 0   Ferrous Sulfate (IRON PO), Take 1 tablet by mouth daily., Disp: , Rfl:    fluocinonide gel (LIDEX) 2.83 %, Apply 1 application topically 3 (three) times daily as needed (rash)., Disp: , Rfl:    hydrochlorothiazide (HYDRODIURIL) 25 MG tablet, TAKE 1 TABLET EVERY DAY, Disp: 90 tablet, Rfl: 1   levothyroxine (SYNTHROID) 50 MCG tablet, TAKE 1 TABLET EVERY DAY, Disp: 90 tablet, Rfl: 1   Magnesium Oxide 400 MG CAPS, Take 1 capsule (400 mg total) by mouth daily., Disp: 90 capsule, Rfl: 3   meloxicam (MOBIC) 15 MG tablet, TAKE 1 TABLET EVERY DAY, Disp: 90 tablet, Rfl: 3   pantoprazole (PROTONIX) 40 MG tablet, TAKE 1 TABLET TWICE DAILY, Disp: 180 tablet, Rfl: 1   potassium chloride SA (KLOR-CON) 20 MEQ tablet, TAKE 1 TABLET EVERY DAY, Disp: 90 tablet, Rfl: 3  Observations/Objective: Patient is well-developed, well-nourished in no acute distress.  Resting comfortably  at home.  Head is normocephalic, atraumatic.  No labored breathing.  Speech is clear and coherent with logical content.  Patient is alert and oriented at baseline.    Assessment and Plan: 1. Vertigo Needs F2F visit, labs, diagnostic workup.  Recommend she reach out to PCP as she is requesting a referral to an ENT in [place].   I instructed her there would be no charge for the visit.  Follow Up Instructions: I discussed the assessment and treatment plan with the patient. The patient was provided an opportunity to ask questions and all were answered. The patient agreed with the plan and demonstrated an understanding of the instructions.  A copy of instructions were sent to the patient via MyChart unless otherwise noted below.    The patient was advised to call back or  seek an in-person evaluation if the symptoms worsen or if the condition fails to improve as anticipated.  Time:  I spent 12 minutes with the patient via telehealth technology discussing the above problems/concerns.    Gildardo Pounds, NP

## 2021-09-01 DIAGNOSIS — R42 Dizziness and giddiness: Secondary | ICD-10-CM | POA: Diagnosis not present

## 2021-09-02 ENCOUNTER — Encounter: Payer: Self-pay | Admitting: Family Medicine

## 2021-09-07 ENCOUNTER — Telehealth (INDEPENDENT_AMBULATORY_CARE_PROVIDER_SITE_OTHER): Payer: Self-pay | Admitting: Endocrinology, Diabetes and Metabolism

## 2021-09-07 NOTE — Telephone Encounter (Signed)
Called to reschedule 09/14/21 appt with Dr. Macon Large as he will not be available. When calling I kept getting a silent line and unable to hear a ring or lvm.     Same issue with Daughter's numbers

## 2021-09-08 NOTE — Telephone Encounter (Signed)
Called to reschedule 09/14/21 appt with Dr. Macon Large as he will not be available. When calling I kept getting a silent line and unable to hear a ring or lvm.     Able to lvm on Helen [name]'s number.

## 2021-09-11 NOTE — Progress Notes (Addendum)
Subjective:       Patient ID: Helen [name] is a 76 y.o. female.    HPI    Helen [name] is being referred by Dr. Geronimo RunningMeaghan Pierce for consultation regarding diabetes.    Helen [name] is a 76 y.o. -year-old female with a history of diabetes since 761991.    In terms of severity with regards to complications, the patient does not have retinopathy, does not have nephropathy, does not have neuropathy, and does not have cardiovascular disease.     With regards to the quality of blood sugar control, the most recent hemoglobin A1c test was 8.3 in April. The patient checks blood sugars 0-1 times per day (states her numbers are mostly in the 100s at varying times).   Current anti-hyperglycemic regimen is comprised of Tradjenta 5 mg daily, metformin 1000 mg bid, Glipizide 20 mg bid.  Other medications tried in the past include: none.     In terms of associated symptoms at this time, the patient does not have polyuria, does not have blurred vision, does not have paresthesias, FOR NEW PATIENTS ONLY: does not have chest pain, does not have shortness of breath, and does not have depression.  With regards to gastroparesis symptoms, the patient does not have nausea and does not have diarrhea    Per the patient's history, last fundoscopic exam was in January and last podiatry exam was May.  Patient had a 3 lb weight increase in the past 8 months.    .   Currently the patient's diet comprises of 2 meals per day.  Prior formal diabetes education/ nutritional counseling has not been performed.       Current frequency of hypoglycemia is weekly- typically late afternoon.  Symptoms include shaky, hungry.  The patient does have a medic alert bracelet/ID that indicates diabetes status.  The patient does not have an updated glucagon emergency kit.  The patient currently lives with daughter, son-in-law, landlord friend.    Currently retired- former Runner, broadcasting/film/videoteacher.      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    Bilateral cataracts     Breast cancer     Breast lump     Cancer     breast cancer    Carcinoma     Carcinoma of the bladder    Chicken pox     Childhood illness    Conduction Disorder     RBBB    Diabetes mellitus type II     Non-Insulin dependent    Echocardiogram 10/2013    Gastroesophageal reflux disease     Hemorrhoids without complication     Holter monitor 02/2014    Hyperlipidemia     Hypertensive disorder     Malignant neoplasm of breast     Measles     Childhood illness    Mumps     Childhood illness    Myocardial perfusion scan 11/2013, 12/2013    MPI single Isotope excrise; MPI dual Isotope Lexiscan    RBBB     Syncope      Past Surgical History:   Procedure Laterality  Date    ABLATION OF DYSRHYTHMIC FOCUS      ARTHROSCOPY, KNEE Left 04/21/2015    Procedure: ARTHROSCOPY, KNEE PARTIAL MEDIAL AND LATERAL MENISECTOMY LEFT KNEE;  Surgeon: Bess Kinds, MD;  Location: [facility];  Service: Orthopedics;  Laterality: Left;  partial medial menisectomy    BREAST BIOPSY      HYSTERECTOMY      total    TUMOR REMOVAL Left     neck  benign      Family History   Problem Relation Age of Onset    Diabetes Mother     Diabetes Sister     Diabetes Sister     Malignant hyperthermia Neg Hx     Pseudochol deficiency Neg Hx     Anesthesia problems Neg Hx      Social History     Tobacco Use    Smoking status: Never    Smokeless tobacco: Never   Vaping Use    Vaping status: Never Used   Substance Use Topics    Alcohol use: No         Review of Systems   Constitutional:  Negative for unexpected weight change.   HENT:  Negative for dental problem.    Eyes:  Negative for visual disturbance.   Respiratory:  Negative for shortness of breath.    Cardiovascular:  Negative for chest pain.   Gastrointestinal:  Negative for diarrhea and nausea.    Endocrine: Negative for polyuria.   Skin:  Negative for wound.   Neurological:  Negative for numbness.   Psychiatric/Behavioral:  Negative for dysphoric mood.            Objective:    Physical Exam  Constitutional:       Appearance: She is well-developed.      Comments: Overweight   Psychiatric:         Mood and Affect: Mood normal.         Judgment: Judgment normal.               Prior labs: 02/16/21: ualb/Cr 0, TSH 1.26, A1c 7.9, Tchol 147, TG 90, HDL 66, LDL 64, Cr 0.8, Ca 9.7, nl LFTs, Hct 36.1; 05/16/21: POC A1c 8.1; 12/25/21: Hct 36, Cr 0.78, Ca 9.5, nl LFTs, TSH 1.49, A1c 8.3, Tchol 140, TG 65, HDL 72, LDL 55, ualb/Cr <17; 02/19/22: A1c 8.4, Tchol 138, TG 58, HDL 64, LDL 62      Assessment:       76 y.o. -year-old female with history of type 2 diabetes without known complications.    I reviewed and summarized the patient's previous medical records today, including previous relevant laboratory results.  I obtained old records for review at this visit.  In summary her prior labs suggest elevated A1c.        Plan:     1. Diabetes type 2 (new with workup): Will check hemoglobin A1c with labs for next visit.  Based on recent blood sugar pattern of variable fasting sugars and prandial/post-prandial sugars, will adjust medication/insulin as follows: continue metformin 1000 mg bid with meals, lower Glipizide to 10 mg bid (currently overdosed and suspicion for afternoon lows based on symptoms), Tradjenta 5 mg daily and start once daily Jardiance 25 mg for now.  Check sugars AM fasting and 2 hours after biggest meal of day with goal fasting/pre-meal range of 80-130; goal postprandial range of 80-160.  Is due for eye exam in coming months; will screen for nephropathy and check  spot urine microalbumin/Cr annually.  Continue to see podiatry for foot care.  Continue carbohydrate-controlled diet-refer to diabetes educator for counseling.  F/U in 3-4 months to reassess.    2. Hypertension (stable): goal blood pressure is  <130/80.  Borderline controlled on regimen of Amlodipine 10 mg daily, Valsartan/HCTZ 160-12.5 mg daily- unable to assess today.    3. Hyperlipidemia (stable): controlled on Lipitor 40 mg daily; goal LDL<100.  Continue low cholesterol, low saturated fat diet.    4. Overweight status/Obesity (stable): counseled patient on at least 150 min/wk of moderate-intensity exercise and consider flexibility/strength training exercises if possible.  Physical activity programs should begin slowly and build up gradually; nutritional counseling status.    5. Hypoglycemia management (stable): counseled patient on need to keep sugar source with her at all times and medic-alert bracelet/indicator specifying diagnosis of diabetes    A total of 60 minutes were spent with the patient which include time spent during face-to-face interaction with the patient and non face-to-face time including time spent reviewing and analyzing records, ordering tests, ordering medications, and completing documentation.  This included time spent counseling the patient on proper administration of medication/insulin, other drug therapy options, dietary recommendations, goals of diabetes therapy, and summarizing potential complications of diabetes.      The patient provided verbal consent for today's video visit encounter.

## 2021-09-11 NOTE — Telephone Encounter (Addendum)
Tried calling numbers on file, unable to reach any number but left message with daughter Stephannie Peters. Please secure chat me and Lafonda Mosses if the pt calls back, 2/20 on hold for this pt, is currently scheduled in June.

## 2021-09-12 ENCOUNTER — Ambulatory Visit (INDEPENDENT_AMBULATORY_CARE_PROVIDER_SITE_OTHER): Payer: 59 | Admitting: Endocrinology, Diabetes and Metabolism

## 2021-09-14 ENCOUNTER — Ambulatory Visit (INDEPENDENT_AMBULATORY_CARE_PROVIDER_SITE_OTHER): Payer: 59 | Admitting: Endocrinology, Diabetes and Metabolism

## 2021-09-21 DIAGNOSIS — M064 Inflammatory polyarthropathy: Secondary | ICD-10-CM | POA: Diagnosis not present

## 2021-09-21 DIAGNOSIS — E039 Hypothyroidism, unspecified: Secondary | ICD-10-CM | POA: Diagnosis not present

## 2021-09-21 DIAGNOSIS — M059 Rheumatoid arthritis with rheumatoid factor, unspecified: Secondary | ICD-10-CM | POA: Diagnosis not present

## 2021-09-21 DIAGNOSIS — M25362 Other instability, left knee: Secondary | ICD-10-CM | POA: Diagnosis not present

## 2021-09-21 DIAGNOSIS — Z13228 Encounter for screening for other metabolic disorders: Secondary | ICD-10-CM | POA: Diagnosis not present

## 2021-09-21 DIAGNOSIS — R7982 Elevated C-reactive protein (CRP): Secondary | ICD-10-CM | POA: Diagnosis not present

## 2021-09-21 DIAGNOSIS — E119 Type 2 diabetes mellitus without complications: Secondary | ICD-10-CM | POA: Diagnosis not present

## 2021-09-21 DIAGNOSIS — E559 Vitamin D deficiency, unspecified: Secondary | ICD-10-CM | POA: Diagnosis not present

## 2021-09-21 DIAGNOSIS — D649 Anemia, unspecified: Secondary | ICD-10-CM | POA: Diagnosis not present

## 2021-09-21 DIAGNOSIS — M7122 Synovial cyst of popliteal space [Baker], left knee: Secondary | ICD-10-CM | POA: Diagnosis not present

## 2021-09-21 DIAGNOSIS — M25562 Pain in left knee: Secondary | ICD-10-CM | POA: Diagnosis not present

## 2021-09-21 DIAGNOSIS — M1712 Unilateral primary osteoarthritis, left knee: Secondary | ICD-10-CM | POA: Diagnosis not present

## 2021-09-28 ENCOUNTER — Ambulatory Visit: Payer: Medicare Other | Admitting: Orthopedic Surgery

## 2021-10-04 ENCOUNTER — Other Ambulatory Visit (INDEPENDENT_AMBULATORY_CARE_PROVIDER_SITE_OTHER): Payer: Self-pay | Admitting: Family

## 2021-10-04 DIAGNOSIS — E119 Type 2 diabetes mellitus without complications: Secondary | ICD-10-CM

## 2021-10-09 ENCOUNTER — Ambulatory Visit (INDEPENDENT_AMBULATORY_CARE_PROVIDER_SITE_OTHER): Payer: Medicare Other | Admitting: Orthopedic Surgery

## 2021-10-09 ENCOUNTER — Encounter: Payer: Self-pay | Admitting: Orthopedic Surgery

## 2021-10-09 ENCOUNTER — Other Ambulatory Visit: Payer: Self-pay

## 2021-10-09 ENCOUNTER — Ambulatory Visit (INDEPENDENT_AMBULATORY_CARE_PROVIDER_SITE_OTHER): Payer: Medicare Other | Admitting: Family Medicine

## 2021-10-09 ENCOUNTER — Ambulatory Visit: Payer: Medicare Other | Admitting: Family Medicine

## 2021-10-09 ENCOUNTER — Ambulatory Visit: Payer: Medicare Other

## 2021-10-09 VITALS — BP 142/88 | HR 74 | Temp 98.2°F | Resp 18 | Ht 65.5 in | Wt 170.0 lb

## 2021-10-09 DIAGNOSIS — N1831 Chronic kidney disease, stage 3a: Secondary | ICD-10-CM | POA: Diagnosis not present

## 2021-10-09 DIAGNOSIS — M7122 Synovial cyst of popliteal space [Baker], left knee: Secondary | ICD-10-CM

## 2021-10-09 DIAGNOSIS — M1712 Unilateral primary osteoarthritis, left knee: Secondary | ICD-10-CM

## 2021-10-09 DIAGNOSIS — S72001D Fracture of unspecified part of neck of right femur, subsequent encounter for closed fracture with routine healing: Secondary | ICD-10-CM | POA: Diagnosis not present

## 2021-10-09 DIAGNOSIS — R42 Dizziness and giddiness: Secondary | ICD-10-CM

## 2021-10-09 NOTE — Progress Notes (Signed)
Subjective:    Patient ID: April Beck, female    DOB: 05-Oct-1945, 76 y.o.   MRN: [id number]  Patient is a 76 year old Caucasian female who presents today to follow-up 3 issues that occurred while she was in [place].  First she was diagnosed with a Baker's cyst behind her left knee.  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  After flying to [place] she developed swelling behind the left knee.  She went to a local hospital where they performed an ultrasound to rule out a DVT and diagnosed her with a Baker's cyst.  She has since had a cortisone injection in the knee that helped some but she still reports tightness and fullness in the knee.  She has an appointment to see her orthopedist pending.  Second, she had an episode of vertigo in December.  The symptoms gradually responded to a combination of steroids and Dramamine.  Third she was diagnosed with a UTI and told that she has stage III chronic kidney disease.  Her last creatinine here in September was 1.09.  GFR was 53. 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, takes meloxicam   Wears contact lenses    Past Surgical History:  Procedure Laterality Date   [place]  06/20/2012   Procedure: LAPAROSCOPIC CHOLECYSTECTOMY;  Surgeon: Zenovia Jarred, MD;  Location: [place];  Service:  General;  Laterality: N/A;   COLONOSCOPY  last one 03-20-2019  dr Ardis Hughs   DIAGNOSTIC LAPAROSCOPY  yrs ago   HIP ARTHROPLASTY Right 11/13/2020   Procedure: ARTHROPLASTY BIPOLAR HIP (HEMIARTHROPLASTY);  Surgeon: Carole Civil, MD;  Location: [facility];  Service: Orthopedics;  Laterality: Right;   INCONTINENCE SURGERY  09-11-2005   dr Jeffie Pollock  @[facility]    LYNX SLING   LAPAROSCOPIC SALPINGO OOPHERECTOMY Bilateral 01/16/2021   Procedure: DIAGNOSTIC LAPAROSCOPY;  Surgeon: Molli Posey, MD;  Location: [facility];  Service: Gynecology;  Laterality: Bilateral;   LAPAROSCOPY Bilateral 01/16/2021   Procedure: EXPLORATORY LAPAROTOMY BILATERAL SALPINGO OOPHORECTOMY;  Surgeon: Molli Posey, MD;  Location: [facility];  Service: Gynecology;  Laterality: Bilateral;   TUBAL LIGATION     Current Outpatient Medications on File Prior to Visit  Medication Sig Dispense Refill   acetaminophen (TYLENOL) 325 MG tablet Take 2 tablets (650 mg total) by mouth every 6 (six) hours as needed for pain.     acyclovir (ZOVIRAX) 400 MG tablet TAKE 1 TABLET EVERY DAY 90 tablet 3   atorvastatin (LIPITOR) 40 MG tablet TAKE 1 TABLET EVERY DAY 90 tablet 3   calcium-vitamin D (OSCAL WITH D) 250-125 MG-UNIT tablet Take 1 tablet by mouth daily.  citalopram (CELEXA) 10 MG tablet TAKE 1 TABLET EVERY DAY 90 tablet 3   diphenoxylate-atropine (LOMOTIL) 2.5-0.025 MG tablet Take 1 tablet by mouth 4 (four) times daily as needed for diarrhea or loose stools. 30 tablet 0   fluocinonide gel (LIDEX) 8.65 % Apply 1 application topically 3 (three) times daily as needed (rash).     hydrochlorothiazide (HYDRODIURIL) 25 MG tablet TAKE 1 TABLET EVERY DAY 90 tablet 1   levothyroxine (SYNTHROID) 50 MCG tablet TAKE 1 TABLET EVERY DAY 90 tablet 1   meloxicam (MOBIC) 15 MG tablet TAKE 1 TABLET EVERY DAY 90 tablet 3   pantoprazole (PROTONIX) 40 MG tablet TAKE 1 TABLET TWICE DAILY 180 tablet 1   potassium chloride SA  (KLOR-CON) 20 MEQ tablet TAKE 1 TABLET EVERY DAY 90 tablet 3   No current facility-administered medications on file prior to visit.   No Known Allergies Social History   Socioeconomic History   Marital status: Divorced    Spouse name: Not on file   Number of children: Not on file   Years of education: Not on file   Highest education level: Not on file  Occupational History   Not on file  Tobacco Use   Smoking status: Never   Smokeless tobacco: Never  Vaping Use   Vaping Use: Never used  Substance and Sexual Activity   Alcohol use: No   Drug use: Never   Sexual activity: Not on file  Other Topics Concern   Not on file  Social History Narrative   Not on file   Social Determinants of Health   Financial Resource Strain: Low Risk    Difficulty of Paying Living Expenses: Not hard at all  Food Insecurity: No Food Insecurity   Worried About Running Out of Food in the Last Year: Never true   [place] in the Last Year: Never true  Transportation Needs: No Transportation Needs   Lack of Transportation (Medical): No   Lack of Transportation (Non-Medical): No  Physical Activity: Insufficiently Active   Days of Exercise per Week: 7 days   Minutes of Exercise per Session: 20 min  Stress: No Stress Concern Present   Feeling of Stress : Not at all  Social Connections: Moderately Integrated   Frequency of Communication with Friends and Family: More than three times a week   Frequency of Social Gatherings with Friends and Family: More than three times a week   Attends Religious Services: More than 4 times per year   Active Member of [employer] or Organizations: Yes   Attends Archivist Meetings: More than 4 times per year   Marital Status: Widowed  Human resources officer Violence: Not At Risk   Fear of Current or Ex-Partner: No   Emotionally Abused: No   Physically Abused: No   Sexually Abused: No       Review of Systems  All other systems reviewed and are  negative.     Objective:   Physical Exam Vitals reviewed.  Constitutional:      General: She is not in acute distress.    Appearance: She is well-developed. She is not diaphoretic.  HENT:     Head: Normocephalic and atraumatic.     Right Ear: External ear normal.     Left Ear: External ear normal.     Nose: Nose normal.     Mouth/Throat:     Pharynx: No oropharyngeal exudate.  Eyes:     General: No scleral icterus.  Right eye: No discharge.        Left eye: No discharge.     Conjunctiva/sclera: Conjunctivae normal.     Pupils: Pupils are equal, round, and reactive to light.  Neck:     Thyroid: No thyromegaly.     Vascular: No JVD.     Trachea: No tracheal deviation.  Cardiovascular:     Rate and Rhythm: Normal rate and regular rhythm.     Heart sounds: Normal heart sounds. No murmur heard.   No friction rub. No gallop.  Pulmonary:     Effort: Pulmonary effort is normal. No respiratory distress.     Breath sounds: Normal breath sounds. No stridor. No wheezing or rales.  Chest:     Chest wall: No tenderness.  Abdominal:     General: Bowel sounds are normal. There is no distension.     Palpations: Abdomen is soft. There is no mass.     Tenderness: There is no abdominal tenderness. There is no guarding or rebound.  Musculoskeletal:        General: No tenderness or deformity.     Cervical back: Normal range of motion and neck supple.  Lymphadenopathy:     Cervical: No cervical adenopathy.  Skin:    General: Skin is warm.     Coloration: Skin is not pale.     Findings: No erythema or rash.  Neurological:     Mental Status: She is alert and oriented to person, place, and time.     Cranial Nerves: No cranial nerve deficit.     Motor: No abnormal muscle tone.     Coordination: Coordination normal.     Deep Tendon Reflexes: Reflexes are normal and symmetric. Reflexes normal.  Psychiatric:        Behavior: Behavior normal.        Thought Content: Thought content  normal.        Judgment: Judgment normal.          Assessment & Plan:  Vertigo  Baker cyst, left  Stage 3a chronic kidney disease ([place])   I try to put her mind at ease regarding kidney disease.  This is a chronic finding for her at her baseline.  I recommended controlling her blood pressure and avoiding NSAIDs.  I believe she does these things she will never have to worry about this.  We spent the majority of our visit discussing a Baker's cyst.  She has an appoint to see an Doctor, general practice.  However she is not certain that she would want surgery for her arthritis at this point.  I explained to the patient that Baker's cyst do not itself is nothing dangerous and is likely a side effect of the effusion stemming from her arthritis.  Lastly we discussed vertigo.  At present time she is asymptomatic and no further treatment is required

## 2021-10-09 NOTE — Progress Notes (Signed)
Chief Complaint  Patient presents with   Post-op Follow-up    11/13/20 right hip fracture / bipolar replacement    Hip Injury   Knee Pain    Left, pain had bakers cyst said dr in [place] said she needs knee replacement    Past Medical History:  Diagnosis Date   Anxiety    hx of panic attack   Arthritis    hands & knees   CKD (chronic kidney disease), stage III ([place])    followed by pcp   Edema of both lower extremities    GERD (gastroesophageal reflux disease)    History of 2019 novel coronavirus disease (COVID-19) 09/17/2020   positive result in epic,  per pt very mild symtpoms that resolved   History of uterine fibroid    Hypertension    followed by pcp   Hypomagnesemia    Hypothyroidism    followed by pcp   IDA (iron deficiency anemia)    Osteopenia    Ovarian cyst    Pelvic pain    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  She went out to [place] to see her son.  While she was there she walked his dog 1 hour to 2 hours twice a day and began having pain and swelling in her left knee.  She went to orthopedics twice was diagnosed with a Baker's cyst and osteoarthritis and told she needed a knee replacement  However since she has gotten home and her activities have changed her knee pain is gotten significantly better and she is wearing a knee brace.  She takes meloxicam for TMJ which helps her knee as well.  The left knee has a functional range of motion with no swelling in the joint but ligaments are stable primary tenderness is on the medial side  X-rays were brought with her on the disc pictures of been placed in the media both knees are actually arthritic with joint space narrowing which seems to be symmetric without malalignment of the tibiofemoral space  Right hip is functioning well  Gait is normal  X-ray of the pelvis looks  normal  Encounter Diagnoses  Name Primary?   Closed fracture of right hip with routine healing, subsequent encounter 11/13/20 bipolar replacement Yes   Unilateral primary osteoarthritis, left knee     We discussed possible treatment options and decided to do the following:  Continue the knee brace Continue the meloxicam X-rays again in 4 months Add Tylenol as needed for pain Continue activity modification

## 2021-10-27 ENCOUNTER — Ambulatory Visit (INDEPENDENT_AMBULATORY_CARE_PROVIDER_SITE_OTHER): Payer: Medicare Other

## 2021-10-27 ENCOUNTER — Ambulatory Visit (HOSPITAL_COMMUNITY)
Admission: RE | Admit: 2021-10-27 | Discharge: 2021-10-27 | Disposition: A | Discharge status: Home / Self Care | Payer: Medicare Other | Source: Ambulatory Visit | Attending: Family Medicine | Admitting: Family Medicine

## 2021-10-27 ENCOUNTER — Encounter (HOSPITAL_COMMUNITY): Payer: Self-pay

## 2021-10-27 ENCOUNTER — Other Ambulatory Visit: Payer: Self-pay

## 2021-10-27 VITALS — BP 152/82 | HR 76 | Temp 98.2°F | Resp 17

## 2021-10-27 DIAGNOSIS — S6992XA Unspecified injury of left wrist, hand and finger(s), initial encounter: Secondary | ICD-10-CM | POA: Diagnosis not present

## 2021-10-27 DIAGNOSIS — L821 Other seborrheic keratosis: Secondary | ICD-10-CM | POA: Diagnosis not present

## 2021-10-27 DIAGNOSIS — M7989 Other specified soft tissue disorders: Secondary | ICD-10-CM | POA: Diagnosis not present

## 2021-10-27 DIAGNOSIS — M25532 Pain in left wrist: Secondary | ICD-10-CM | POA: Diagnosis not present

## 2021-10-27 NOTE — ED Triage Notes (Signed)
Pt reports falling down her steps yesterday and states her L hand is swollen and in pain. States she can bend her fingers.

## 2021-10-27 NOTE — Discharge Instructions (Addendum)
You were seen today for several issues.  The skin lesion is a benign lesion.  This is not cancerous and not concerning at this time.  Your wrist xray did not show overt fracture, but there is an irregularity where you are most tender that makes me suspicious you have a small fracture.  We have given you a splint today.  Please wear this at all times.  I recommend you follow up with a hand specialist/orthopedist for this.  You may call yours, but if you need another please call [phone] for an appointment.

## 2021-10-27 NOTE — ED Provider Notes (Signed)
[name]    CSN: [id number] Arrival date & time: 10/27/21  2706      History   Chief Complaint Chief Complaint  Patient presents with   Fall        Arm Injury    HPI April Beck is a 75 y.o. female.   Yesterday she was walking down her stairs carrying an empty box.  She fell forward, and thinks she jammed her hand.  She has pain and swelling at the left thumb/wrist/hand.  Able to move the hand but difficulty lifting anything.  She did take motrin last night.    She has a mole at her lower stomach that she would like looked at.   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, takes meloxicam   Wears contact lenses     Patient Active Problem List   Diagnosis Date Noted   Ovarian cyst 01/16/2021   Chronic constipation 11/21/2020   CKD (chronic kidney disease) stage 3, GFR 30-59 ml/min ([place]) 11/21/2020   Herpes 11/21/2020   Hypokalemia 11/21/2020   Acute blood loss anemia 11/21/2020   Weakness 11/17/2020   Aspiration into airway    Fall    Closed fracture of right hip ([place]) 11/12/2020   Dysphagia, unspecified(787.20) 06/14/2013   Heel pain 06/14/2013   Hypercholesteremia    Hypertension    GERD (gastroesophageal reflux disease)    Hypothyroidism    Renal insufficiency 06/19/2012    Past Surgical History:  Procedure Laterality Date   ABDOMINAL  HYSTERECTOMY  1982   CHOLECYSTECTOMY  06/20/2012   Procedure: LAPAROSCOPIC CHOLECYSTECTOMY;  Surgeon: Zenovia Jarred, MD;  Location: [place];  Service: General;  Laterality: N/A;   COLONOSCOPY  last one 03-20-2019  dr Ardis Hughs   DIAGNOSTIC LAPAROSCOPY  yrs ago   HIP ARTHROPLASTY Right 11/13/2020   Procedure: ARTHROPLASTY BIPOLAR HIP (HEMIARTHROPLASTY);  Surgeon: Carole Civil, MD;  Location: [facility];  Service: Orthopedics;  Laterality: Right;   INCONTINENCE SURGERY  09-11-2005   dr Jeffie Pollock  @[facility]    LYNX SLING   LAPAROSCOPIC SALPINGO OOPHERECTOMY Bilateral 01/16/2021   Procedure: DIAGNOSTIC LAPAROSCOPY;  Surgeon: Molli Posey, MD;  [facility];  Service: Gynecology;  Laterality: Bilateral;   LAPAROSCOPY Bilateral 01/16/2021   Procedure: EXPLORATORY LAPAROTOMY BILATERAL SALPINGO OOPHORECTOMY;  Surgeon: Molli Posey, MD;  Location: [facility];  Service: Gynecology;  Laterality: Bilateral;   TUBAL LIGATION      OB History     Gravida  2   Para  2   Term      Preterm      AB      Living  2      SAB  IAB      Ectopic      Multiple      Live Births               Home Medications    Prior to Admission medications   Medication Sig Start Date End Date Taking? Authorizing Provider  acetaminophen (TYLENOL) 325 MG tablet Take 2 tablets (650 mg total) by mouth every 6 (six) hours as needed for pain. 06/21/12   Earnstine Regal, PA-C  acyclovir (ZOVIRAX) 400 MG tablet TAKE 1 TABLET EVERY DAY 06/26/21   Susy Frizzle, MD  atorvastatin (LIPITOR) 40 MG tablet TAKE 1 TABLET EVERY DAY 06/26/21   Susy Frizzle, MD  calcium-vitamin D (OSCAL WITH D) 250-125 MG-UNIT tablet Take 1 tablet by mouth daily.    [provider]  citalopram (CELEXA) 10 MG tablet TAKE 1 TABLET EVERY DAY 06/26/21   Susy Frizzle, MD  diphenoxylate-atropine (LOMOTIL) 2.5-0.025 MG tablet Take 1 tablet by mouth 4 (four) times daily as needed  for diarrhea or loose stools. 01/05/21   Susy Frizzle, MD  fluocinonide gel (LIDEX) 7.06 % Apply 1 application topically 3 (three) times daily as needed (rash). 09/08/20   [provider]  hydrochlorothiazide (HYDRODIURIL) 25 MG tablet TAKE 1 TABLET EVERY DAY 06/02/21   Susy Frizzle, MD  levothyroxine (SYNTHROID) 50 MCG tablet TAKE 1 TABLET EVERY DAY 06/02/21   Susy Frizzle, MD  meloxicam (MOBIC) 15 MG tablet TAKE 1 TABLET EVERY DAY 08/14/21   Susy Frizzle, MD  pantoprazole (PROTONIX) 40 MG tablet TAKE 1 TABLET TWICE DAILY 06/02/21   Susy Frizzle, MD  potassium chloride SA (KLOR-CON) 20 MEQ tablet TAKE 1 TABLET EVERY DAY 06/26/21   Susy Frizzle, MD    Family History Family History  Problem Relation Age of Onset   Colon polyps Brother    Breast cancer Maternal Grandmother    Diabetes Brother    Hypertension Brother    Colon cancer Neg Hx    Esophageal cancer Neg Hx    Rectal cancer Neg Hx    Stomach cancer Neg Hx     Social History Social History   Tobacco Use   Smoking status: Never   Smokeless tobacco: Never  Vaping Use   Vaping Use: Never used  Substance Use Topics   Alcohol use: No   Drug use: Never     Allergies   Patient has no known allergies.   Review of Systems Review of Systems  Constitutional: Negative.   HENT: Negative.    Respiratory: Negative.    Cardiovascular: Negative.   Gastrointestinal: Negative.   Musculoskeletal:  Positive for joint swelling.  Skin:  Positive for color change.    Physical Exam Triage Vital Signs ED Triage Vitals  Enc Vitals Group     BP 10/27/21 0945 (!) 152/82     Pulse Rate 10/27/21 0945 76     Resp 10/27/21 0945 17     Temp 10/27/21 0945 98.2 F (36.8 C)     Temp Source 10/27/21 0945 Oral     SpO2 10/27/21 0945 97 %     Weight --      Height --      Head Circumference --      Peak Flow --      Pain Score 10/27/21 0944 5     Pain Loc --      Pain Edu? --      Excl. in [place]? --  No data found.  Updated Vital Signs BP (!) 152/82 (BP Location: Left Arm)    Pulse 76    Temp 98.2 F (36.8 C) (Oral)    Resp 17    SpO2 97%   Visual Acuity Right Eye Distance:   Left Eye Distance:   Bilateral Distance:    Right Eye Near:   Left Eye Near:    Bilateral Near:     Physical Exam Constitutional:      Appearance: Normal appearance.  Musculoskeletal:     Comments: Swelling to the left wrist;  she has TTP to the distal radius and ulna;  TTP at the base of the thumb;  able to flex fingers but not fully due to pain;  unable to bend/extend wrist due to pain  Skin:    General: Skin is warm and dry.     Comments: At the right upper pubic area is a slightly raised, tan crusty lesion, c/w seborrheic keratosis   Neurological:     Mental Status: She is alert.     UC Treatments / Results  Labs (all labs ordered are listed, but only abnormal results are displayed) Labs Reviewed - No data to display  EKG   Radiology DG Wrist Complete Left  Result Date: 10/27/2021 CLINICAL DATA:  Fall with left wrist pain and swelling EXAM: LEFT WRIST - COMPLETE 3+ VIEW COMPARISON:  None. FINDINGS: Cortical irregularity at the dorsal lip of the radius, favored chronic and related to wrist osteoarthritis that causes spurring and narrowing, but indeterminate in this setting. No dislocation. Generalized osteopenia. First CMC osteoarthritis. IMPRESSION: Dorsal distal radius cortex irregularity favoring degenerative spurring, but please correlate for focal tenderness. Electronically Signed   By: Jorje Guild M.D.   On: 10/27/2021 10:25    Procedures Procedures (including critical care time)  Medications Ordered in UC Medications - No data to display  Initial Impression / Assessment and Plan / UC Course  I have reviewed the triage vital signs and the nursing notes.  Pertinent labs & imaging results that were available during my care of the patient were reviewed by me and considered in  my medical decision making (see chart for details).   Patient was seen primarily for wrist pain after a fall. Xray was questionable for fracture, which is where she is particularly tender.  I will place her in a thumb spica splint, and advise her to follow up with hand/ortho for further evaluation.  She is aware and agrees.   Final Clinical Impressions(s) / UC Diagnoses   Final diagnoses:  Seborrheic keratosis  Wrist injury, left, initial encounter     Discharge Instructions      You were seen today for several issues.  The skin lesion is a benign lesion.  This is not cancerous and not concerning at this time.  Your wrist xray did not show overt fracture, but there is an irregularity where you are most tender that makes me suspicious you have a small fracture.  We have given you a splint today.  Please wear this at all times.  I recommend you follow up with a hand specialist/orthopedist for this.  You may call yours, but if you need another please call [phone] for an appointment.     ED Prescriptions   None    PDMP not reviewed this encounter.   Rondel Oh, MD 10/27/21 1057

## 2021-10-27 NOTE — ED Triage Notes (Signed)
Pt states she has a mole on her stomach she is concerned about.

## 2021-10-31 ENCOUNTER — Ambulatory Visit (INDEPENDENT_AMBULATORY_CARE_PROVIDER_SITE_OTHER): Payer: Medicare Other | Admitting: Orthopedic Surgery

## 2021-10-31 ENCOUNTER — Encounter: Payer: Self-pay | Admitting: Orthopedic Surgery

## 2021-10-31 ENCOUNTER — Other Ambulatory Visit: Payer: Self-pay

## 2021-10-31 ENCOUNTER — Ambulatory Visit (INDEPENDENT_AMBULATORY_CARE_PROVIDER_SITE_OTHER): Payer: Medicare Other

## 2021-10-31 DIAGNOSIS — M25532 Pain in left wrist: Secondary | ICD-10-CM

## 2021-10-31 DIAGNOSIS — S52502A Unspecified fracture of the lower end of left radius, initial encounter for closed fracture: Secondary | ICD-10-CM

## 2021-10-31 DIAGNOSIS — S52592A Other fractures of lower end of left radius, initial encounter for closed fracture: Secondary | ICD-10-CM

## 2021-10-31 HISTORY — DX: Unspecified fracture of the lower end of left radius, initial encounter for closed fracture: S52.502A

## 2021-10-31 NOTE — Progress Notes (Signed)
Office Visit Note   Patient: April Beck           Date of Birth: 1946/03/31           MRN: [id number] Visit Date: 10/31/2021              Requested by: Susy Frizzle, MD [address] [facility] [place],  [place] [address] PCP: Susy Frizzle, MD   Assessment & Plan: Visit Diagnoses:  1. Pain in left wrist   2. Other closed fracture of distal end of left radius, initial encounter     Plan: Reviewed x-rays with patient which show possible fracture fragment from the dorsal aspect of the distal radius.  She is point tender in this area.  Discussed treating this nonoperatively in a wrist brace.  She is to keep the brace on full-time.  I can see her back in another week with repeat x-ray.  Follow-Up Instructions: No follow-ups on file.   Orders:  Orders Placed This Encounter  Procedures   XR Wrist Complete Left   No orders of the defined types were placed in this encounter.     Procedures: Splinting  Date/Time: 10/31/2021 4:13 PM Performed by: Sherilyn Cooter, MD Authorized by: Sherilyn Cooter, MD   Consent Given by:  Patient Location:  Wrist  left wrist Fracture Type: distal radius   Neurovascularly intact   Distal Perfusion: normal   Distal Sensation: normal   Immobilization:  Brace Is this the patient's first splint for this injury?: No   Splint/Brace Type:  Short arm Supplies Used:  Aluminum splint Neurovascularly intact   Distal Perfusion: normal   Distal Sensation: normal   Patient tolerance:  Patient tolerated the procedure well with no immediate complications   Clinical Data: No additional findings.   Subjective: Chief Complaint  Patient presents with   Left Wrist - New Patient (Initial Visit)    This is a 76 year old right-hand-dominant female who presents for ER follow-up of a left wrist injury.  She was walking down some steps carrying a box last Thursday when she fell onto her left wrist.  She was seen in urgent care on Friday and  was found to have a questionable distal radius fracture.  She is been wearing a thumb spica Velcro brace since then.  She describes pain at the dorsal aspect of the wrist with significant volar ecchymosis.  She is able to make a full fist with minimal pain.  She denies any numbness or paresthesias.   Review of Systems   Objective: Vital Signs: There were no vitals taken for this visit.  Physical Exam Constitutional:      Appearance: Normal appearance.  Cardiovascular:     Rate and Rhythm: Normal rate.     Pulses: Normal pulses.  Pulmonary:     Effort: Pulmonary effort is normal.  Skin:    General: Skin is warm and dry.     Capillary Refill: Capillary refill takes less than 2 seconds.  Neurological:     Mental Status: She is alert.    Left Hand Exam   Tenderness  Left hand tenderness location: TTP at dorsal aspect of distal radius.  Minimal volar wrist tenderness with moderate ecchymosis.   Other  Erythema: absent Sensation: normal Pulse: present  Comments:  Full wrist ROM but dorsal wrist pain/soreness.  Mild volar wrist swelling with moderate volar ecchymosis.  No dorsal ecchymosis.  Able to make full composite fist.      Specialty Comments:  No specialty comments available.  Imaging: No results found.   PMFS History: Patient Active Problem List   Diagnosis Date Noted   Closed fracture of left distal radius 10/31/2021   Ovarian cyst 01/16/2021   Chronic constipation 11/21/2020   CKD (chronic kidney disease) stage 3, GFR 30-59 ml/min (HCC) 11/21/2020   Herpes 11/21/2020   Hypokalemia 11/21/2020   Acute blood loss anemia 11/21/2020   Weakness 11/17/2020   Aspiration into airway    Fall    Closed fracture of right hip ([place]) 11/12/2020   Dysphagia, unspecified(787.20) 06/14/2013   Heel pain 06/14/2013   Hypercholesteremia    Hypertension    GERD (gastroesophageal reflux disease)    Hypothyroidism    Renal insufficiency 06/19/2012   Past Medical  History:  Diagnosis Date   Anxiety    hx of panic attack   Arthritis    hands & knees   CKD (chronic kidney disease), stage III ([place])    followed by pcp   Edema of both lower extremities    GERD (gastroesophageal reflux disease)    History of 2019 novel coronavirus disease (COVID-19) 09/17/2020   positive result in epic,  per pt very mild symtpoms that resolved   History of uterine fibroid    Hypertension    followed by pcp   Hypomagnesemia    Hypothyroidism    followed by pcp   IDA (iron deficiency anemia)    Osteopenia    Ovarian cyst    Pelvic pain    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, takes meloxicam   Wears contact lenses     Family History  Problem Relation Age of Onset   Colon polyps Brother    Breast cancer Maternal Grandmother    Diabetes Brother    Hypertension Brother    Colon cancer Neg Hx    Esophageal cancer Neg Hx    Rectal cancer Neg Hx    Stomach cancer Neg Hx     Past Surgical History:  Procedure Laterality Date   ABDOMINAL HYSTERECTOMY  1982   CHOLECYSTECTOMY  06/20/2012   Procedure: LAPAROSCOPIC CHOLECYSTECTOMY;  Surgeon: Zenovia Jarred, MD;  Location: [place];  Service: General;  Laterality: N/A;   COLONOSCOPY  last one 03-20-2019  dr Ardis Hughs   DIAGNOSTIC LAPAROSCOPY  yrs ago   HIP ARTHROPLASTY Right 11/13/2020   Procedure: ARTHROPLASTY BIPOLAR HIP (HEMIARTHROPLASTY);  Surgeon: Carole Civil, MD;  Location: [facility];  Service: Orthopedics;  Laterality: Right;   INCONTINENCE SURGERY  09-11-2005   dr Jeffie Pollock  @[facility]    LYNX SLING   LAPAROSCOPIC SALPINGO OOPHERECTOMY Bilateral 01/16/2021   Procedure: DIAGNOSTIC LAPAROSCOPY;  Surgeon: Molli Posey, MD;  Location[facility];  Service: Gynecology;  Laterality: Bilateral;   LAPAROSCOPY Bilateral 01/16/2021   Procedure: EXPLORATORY LAPAROTOMY BILATERAL SALPINGO OOPHORECTOMY;  Surgeon: Molli Posey, MD;  Location: [facility];  Service:  Gynecology;  Laterality: Bilateral;   TUBAL LIGATION     Social History   Occupational History   Not on file  Tobacco Use   Smoking status: Never   Smokeless tobacco: Never  Vaping Use   Vaping Use: Never used  Substance and Sexual Activity   Alcohol use: No   Drug use: Never   Sexual activity: Not on file

## 2021-11-01 ENCOUNTER — Telehealth (INDEPENDENT_AMBULATORY_CARE_PROVIDER_SITE_OTHER): Payer: Self-pay | Admitting: Family

## 2021-11-01 NOTE — Telephone Encounter (Signed)
Pt called to request for the referral for the Mammogram,     Thank you...!    Phone: [phone]

## 2021-11-02 ENCOUNTER — Other Ambulatory Visit (INDEPENDENT_AMBULATORY_CARE_PROVIDER_SITE_OTHER): Payer: Self-pay | Admitting: Family Medicine

## 2021-11-02 DIAGNOSIS — Z1239 Encounter for other screening for malignant neoplasm of breast: Secondary | ICD-10-CM

## 2021-11-07 ENCOUNTER — Ambulatory Visit (INDEPENDENT_AMBULATORY_CARE_PROVIDER_SITE_OTHER): Payer: Medicare Other | Admitting: Orthopedic Surgery

## 2021-11-07 ENCOUNTER — Other Ambulatory Visit: Payer: Self-pay

## 2021-11-07 ENCOUNTER — Ambulatory Visit (INDEPENDENT_AMBULATORY_CARE_PROVIDER_SITE_OTHER): Payer: Medicare Other

## 2021-11-07 DIAGNOSIS — M25532 Pain in left wrist: Secondary | ICD-10-CM

## 2021-11-07 DIAGNOSIS — S52592A Other fractures of lower end of left radius, initial encounter for closed fracture: Secondary | ICD-10-CM

## 2021-11-07 NOTE — Progress Notes (Signed)
? ?Office Visit Note ?  ?Patient: April Beck           ?Date of Birth: April 20, 1946           ?MRN: [id number] ?Visit Date: 11/07/2021 ?             ?Requested by: Susy Frizzle, MD ?[address] [place],  [place] [address] ?PCP: Susy Frizzle, MD ? ? ?Assessment & Plan: ?Visit Diagnoses:  ?1. Pain in left wrist   ?2. Other closed fracture of distal end of left radius, initial encounter   ? ? ?Plan: Patient is doing well today.  We reviewed her x-rays from today which are unchanged from previous.  Her pain is improved.  She has minimal pain with range of motion only at terminal extension.  She has been wearing her brace.  I will see her back in 2 weeks but she should still continue to wear the brace full-time. ? ?Follow-Up Instructions: No follow-ups on file.  ? ?Orders:  ?Orders Placed This Encounter  ?Procedures  ? XR Wrist Complete Left  ? ?No orders of the defined types were placed in this encounter. ? ? ? ? Procedures: ?No procedures performed ? ? ?Clinical Data: ?No additional findings. ? ? ?Subjective: ?Chief Complaint  ?Patient presents with  ? Left Wrist - Fracture, Follow-up  ?  Doing better still wearing brace at all times except to shower  ? ? ?Is a 76 year old right-hand-dominant female presents for ER follow-up of a left wrist injury.  She was walking on steps about a week and a half ago when she fell onto her left wrist.  She was found to have a small fracture fragment from the dorsal aspect of the distal radius.  She has been in a removable wrist brace since then.  He is wearing the brace full-time.  Pain is much improved today from previous. ? ? ?Review of Systems ? ? ?Objective: ?Vital Signs: There were no vitals taken for this visit. ? ?Physical Exam ? ?Left Hand Exam  ? ?Tenderness  ?Left hand tenderness location: TTP at dorsal aspect of distal radius.  Mild dorsal ecchymosis improved.  ? ?Other  ?Erythema: absent ?Sensation: normal ?Pulse: present ? ?Comments:  Moderate  swelling but much improved from previous.  ? ? ? ? ?Specialty Comments:  ?No specialty comments available. ? ?Imaging: ?No results found. ? ? ?PMFS History: ?Patient Active Problem List  ? Diagnosis Date Noted  ? Closed fracture of left distal radius 10/31/2021  ? Ovarian cyst 01/16/2021  ? Chronic constipation 11/21/2020  ? CKD (chronic kidney disease) stage 3, GFR 30-59 ml/min (HCC) 11/21/2020  ? Herpes 11/21/2020  ? Hypokalemia 11/21/2020  ? Acute blood loss anemia 11/21/2020  ? Weakness 11/17/2020  ? Aspiration into airway   ? Fall   ? Closed fracture of right hip ([place]) 11/12/2020  ? Dysphagia, unspecified(787.20) 06/14/2013  ? Heel pain 06/14/2013  ? Hypercholesteremia   ? Hypertension   ? GERD (gastroesophageal reflux disease)   ? Hypothyroidism   ? Renal insufficiency 06/19/2012  ? ?Past Medical History:  ?Diagnosis Date  ? Anxiety   ? hx of panic attack  ? Arthritis   ? hands & knees  ? CKD (chronic kidney disease), stage III ([place])   ? followed by pcp  ? Edema of both lower extremities   ? GERD (gastroesophageal reflux disease)   ? History of 2019 novel coronavirus disease (COVID-19) 09/17/2020  ? positive result in  epic,  per pt very mild symtpoms that resolved  ? History of uterine fibroid   ? Hypertension   ? followed by pcp  ? Hypomagnesemia   ? Hypothyroidism   ? followed by pcp  ? IDA (iron deficiency anemia)   ? Osteopenia   ? Ovarian cyst   ? Pelvic pain   ? TMJ (temporomandibular joint disorder)   ? per pt right side, takes meloxicam  ? Wears contact lenses   ?  ?Family History  ?Problem Relation Age of Onset  ? Colon polyps Brother   ? Breast cancer Maternal Grandmother   ? Diabetes Brother   ? Hypertension Brother   ? Colon cancer Neg Hx   ? Esophageal cancer Neg Hx   ? Rectal cancer Neg Hx   ? Stomach cancer Neg Hx   ?  ?Past Surgical History:  ?Procedure Laterality Date  ? ABDOMINAL HYSTERECTOMY  1982  ? CHOLECYSTECTOMY  06/20/2012  ? Procedure: LAPAROSCOPIC CHOLECYSTECTOMY;  Surgeon: Zenovia Jarred, MD;  Location: [place];  Service: General;  Laterality: N/A;  ? COLONOSCOPY  last one 03-20-2019  dr Ardis Hughs  ? DIAGNOSTIC LAPAROSCOPY  yrs ago  ? HIP ARTHROPLASTY Right 11/13/2020  ? Procedure: ARTHROPLASTY BIPOLAR HIP (HEMIARTHROPLASTY);  Surgeon: Carole Civil, MD;  Location[facility];  Service: Orthopedics;  Laterality: Right;  ? INCONTINENCE SURGERY  09-11-2005   dr Jeffie Pollock  '@[facility]'$   ? Janyth Pupa  ? LAPAROSCOPIC SALPINGO OOPHERECTOMY Bilateral 01/16/2021  ? Procedure: DIAGNOSTIC LAPAROSCOPY;  Surgeon: Molli Posey, MD;  Location: [facility];  Service: Gynecology;  Laterality: Bilateral;  ? LAPAROSCOPY Bilateral 01/16/2021  ? Procedure: EXPLORATORY LAPAROTOMY BILATERAL SALPINGO OOPHORECTOMY;  Surgeon: Molli Posey, MD;  Location: [facility];  Service: Gynecology;  Laterality: Bilateral;  ? TUBAL LIGATION    ? ?Social History  ? ?Occupational History  ? Not on file  ?Tobacco Use  ? Smoking status: Never  ? Smokeless tobacco: Never  ?Vaping Use  ? Vaping Use: Never used  ?Substance and Sexual Activity  ? Alcohol use: No  ? Drug use: Never  ? Sexual activity: Not on file  ? ? ? ? ? ? ?

## 2021-11-13 ENCOUNTER — Other Ambulatory Visit (INDEPENDENT_AMBULATORY_CARE_PROVIDER_SITE_OTHER): Payer: Self-pay | Admitting: Family

## 2021-11-13 DIAGNOSIS — E1165 Type 2 diabetes mellitus with hyperglycemia: Secondary | ICD-10-CM

## 2021-11-13 NOTE — Telephone Encounter (Signed)
Please notify pt that script was filled short term. She is overdue for DMII as she has not yet established care with endocrinology as previously planned.

## 2021-11-14 ENCOUNTER — Encounter (INDEPENDENT_AMBULATORY_CARE_PROVIDER_SITE_OTHER): Payer: Self-pay

## 2021-11-20 DIAGNOSIS — L820 Inflamed seborrheic keratosis: Secondary | ICD-10-CM | POA: Diagnosis not present

## 2021-11-20 DIAGNOSIS — Z1283 Encounter for screening for malignant neoplasm of skin: Secondary | ICD-10-CM | POA: Diagnosis not present

## 2021-11-20 DIAGNOSIS — D2272 Melanocytic nevi of left lower limb, including hip: Secondary | ICD-10-CM | POA: Diagnosis not present

## 2021-11-20 DIAGNOSIS — L821 Other seborrheic keratosis: Secondary | ICD-10-CM | POA: Diagnosis not present

## 2021-11-20 DIAGNOSIS — D225 Melanocytic nevi of trunk: Secondary | ICD-10-CM | POA: Diagnosis not present

## 2021-11-20 DIAGNOSIS — D485 Neoplasm of uncertain behavior of skin: Secondary | ICD-10-CM | POA: Diagnosis not present

## 2021-11-21 ENCOUNTER — Other Ambulatory Visit: Payer: Self-pay

## 2021-11-21 ENCOUNTER — Other Ambulatory Visit: Payer: Self-pay | Admitting: Family Medicine

## 2021-11-21 ENCOUNTER — Ambulatory Visit (INDEPENDENT_AMBULATORY_CARE_PROVIDER_SITE_OTHER): Payer: Medicare Other

## 2021-11-21 ENCOUNTER — Ambulatory Visit (INDEPENDENT_AMBULATORY_CARE_PROVIDER_SITE_OTHER): Payer: Medicare Other | Admitting: Orthopedic Surgery

## 2021-11-21 DIAGNOSIS — S52592A Other fractures of lower end of left radius, initial encounter for closed fracture: Secondary | ICD-10-CM

## 2021-11-21 NOTE — Progress Notes (Signed)
Office Visit Note   Patient: April Beck           Date of Birth: 1945-11-09           MRN: [id number] Visit Date: 11/21/2021              Requested by: Susy Frizzle, MD [address] [facility] [place],  [place] [address] PCP: Susy Frizzle, MD   Assessment & Plan: Visit Diagnoses:  1. Other closed fracture of distal end of left radius, initial encounter     Plan: Reviewed today's x-rays with patient which show increased lucency through the radial styloid suggesting a radial styloid fracture that was not as readily apparent previously.  Her pain is much improved.  She now has much better active range of motion than previous.  Discussed keeping the removable brace on full-time and only taking it off for hygiene.  I will see her back in another 2 to 3 weeks with repeat x-rays.  Follow-Up Instructions: No follow-ups on file.   Orders:  Orders Placed This Encounter  Procedures   XR Wrist Complete Left   No orders of the defined types were placed in this encounter.     Procedures: No procedures performed   Clinical Data: No additional findings.   Subjective: Chief Complaint  Patient presents with   Left Wrist - Follow-up, Fracture    This is a 76 year old right-hand-dominant female who presents for follow-up of a left wrist fracture.  She fell walking up some steps just under 4 weeks ago.  She has been treated in a removable wrist brace.  Her pain is much improved today.  She now has active range of motion at the wrist which she did not previously.  She is only taken the brace off for hygiene and to wash her hands.  She thinks her swelling is much improved today.   Review of Systems   Objective: Vital Signs: There were no vitals taken for this visit.  Physical Exam Constitutional:      Appearance: Normal appearance.  Cardiovascular:     Rate and Rhythm: Normal rate.     Pulses: Normal pulses.  Pulmonary:     Effort: Pulmonary effort is normal.   Skin:    General: Skin is warm and dry.     Capillary Refill: Capillary refill takes less than 2 seconds.  Neurological:     Mental Status: She is alert.    Left Hand Exam   Tenderness  Left hand tenderness location: TTP across dorsal aspect of radius.  Much improved swelling.   Other  Erythema: absent Sensation: normal Pulse: present     Specialty Comments:  No specialty comments available.  Imaging: No results found.   PMFS History: Patient Active Problem List   Diagnosis Date Noted   Closed fracture of left distal radius 10/31/2021   Ovarian cyst 01/16/2021   Chronic constipation 11/21/2020   CKD (chronic kidney disease) stage 3, GFR 30-59 ml/min (HCC) 11/21/2020   Herpes 11/21/2020   Hypokalemia 11/21/2020   Acute blood loss anemia 11/21/2020   Weakness 11/17/2020   Aspiration into airway    Fall    Closed fracture of right hip ([place]) 11/12/2020   Dysphagia, unspecified(787.20) 06/14/2013   Heel pain 06/14/2013   Hypercholesteremia    Hypertension    GERD (gastroesophageal reflux disease)    Hypothyroidism    Renal insufficiency 06/19/2012   Past Medical History:  Diagnosis Date   Anxiety  hx of panic attack   Arthritis    hands & knees   CKD (chronic kidney disease), stage III (HCC)    followed by pcp   Edema of both lower extremities    GERD (gastroesophageal reflux disease)    History of 2019 novel coronavirus disease (COVID-19) 09/17/2020   positive result in epic,  per pt very mild symtpoms that resolved   History of uterine fibroid    Hypertension    followed by pcp   Hypomagnesemia    Hypothyroidism    followed by pcp   IDA (iron deficiency anemia)    Osteopenia    Ovarian cyst    Pelvic pain    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, takes meloxicam   Wears contact lenses     Family History  Problem Relation Age of Onset   Colon polyps Brother    Breast cancer Maternal Grandmother    Diabetes Brother     Hypertension Brother    Colon cancer Neg Hx    Esophageal cancer Neg Hx    Rectal cancer Neg Hx    Stomach cancer Neg Hx     Past Surgical History:  Procedure Laterality Date   ABDOMINAL HYSTERECTOMY  1982   CHOLECYSTECTOMY  06/20/2012   Procedure: LAPAROSCOPIC CHOLECYSTECTOMY;  Surgeon: Zenovia Jarred, MD;  Location: [place];  Service: General;  Laterality: N/A;   COLONOSCOPY  last one 03-20-2019  dr Ardis Hughs   DIAGNOSTIC LAPAROSCOPY  yrs ago   HIP ARTHROPLASTY Right 11/13/2020   Procedure: ARTHROPLASTY BIPOLAR HIP (HEMIARTHROPLASTY);  Surgeon: Carole Civil, MD;  Location: [facility];  Service: Orthopedics;  Laterality: Right;   INCONTINENCE SURGERY  09-11-2005   dr Jeffie Pollock  '@[facility]'$    LYNX SLING   LAPAROSCOPIC SALPINGO OOPHERECTOMY Bilateral 01/16/2021   Procedure: DIAGNOSTIC LAPAROSCOPY;  Surgeon: Molli Posey, MD;  Location: [facility];  Service: Gynecology;  Laterality: Bilateral;   LAPAROSCOPY Bilateral 01/16/2021   Procedure: EXPLORATORY LAPAROTOMY BILATERAL SALPINGO OOPHORECTOMY;  Surgeon: Molli Posey, MD;  Location: [facility];  Service: Gynecology;  Laterality: Bilateral;   TUBAL LIGATION     Social History   Occupational History   Not on file  Tobacco Use   Smoking status: Never   Smokeless tobacco: Never  Vaping Use   Vaping Use: Never used  Substance and Sexual Activity   Alcohol use: No   Drug use: Never   Sexual activity: Not on file

## 2021-11-21 NOTE — Telephone Encounter (Signed)
LOV 10/09/21 ?Last refill 01/05/21, #30, 0 refills ? ?Please review, thanks! ? ?

## 2021-11-30 ENCOUNTER — Other Ambulatory Visit (INDEPENDENT_AMBULATORY_CARE_PROVIDER_SITE_OTHER): Payer: Self-pay | Admitting: Family

## 2021-11-30 NOTE — Telephone Encounter (Signed)
Pt is well overdue for diabetes follow up- at last appointment A1C was uncontrolled but she was scheduled to establish with endo for management. I see communciations between pt and endo but I don't believe she ever actually established care. Please clarify if we or endo is managing her diabetes and schedule an office visit if appropriate.

## 2021-12-07 DIAGNOSIS — L988 Other specified disorders of the skin and subcutaneous tissue: Secondary | ICD-10-CM | POA: Diagnosis not present

## 2021-12-07 DIAGNOSIS — D485 Neoplasm of uncertain behavior of skin: Secondary | ICD-10-CM | POA: Diagnosis not present

## 2021-12-10 ENCOUNTER — Encounter: Payer: Self-pay | Admitting: Family Medicine

## 2021-12-11 ENCOUNTER — Other Ambulatory Visit (INDEPENDENT_AMBULATORY_CARE_PROVIDER_SITE_OTHER): Payer: Self-pay | Admitting: Family

## 2021-12-12 ENCOUNTER — Other Ambulatory Visit (INDEPENDENT_AMBULATORY_CARE_PROVIDER_SITE_OTHER): Payer: Self-pay | Admitting: Family

## 2021-12-12 DIAGNOSIS — E1165 Type 2 diabetes mellitus with hyperglycemia: Secondary | ICD-10-CM

## 2021-12-13 ENCOUNTER — Telehealth (INDEPENDENT_AMBULATORY_CARE_PROVIDER_SITE_OTHER): Payer: Self-pay

## 2021-12-13 NOTE — Telephone Encounter (Signed)
Spoke with pt to ask if she's established care with endo with her uncontrolled DMII. Pt states earliest appt is in June and she has it scheduled.

## 2021-12-13 NOTE — Telephone Encounter (Signed)
Called pt and left VM with call-back number only.

## 2021-12-14 ENCOUNTER — Ambulatory Visit (INDEPENDENT_AMBULATORY_CARE_PROVIDER_SITE_OTHER): Payer: Medicare Other | Admitting: Orthopedic Surgery

## 2021-12-14 ENCOUNTER — Encounter: Payer: Self-pay | Admitting: Orthopedic Surgery

## 2021-12-14 ENCOUNTER — Ambulatory Visit: Payer: Medicare Other

## 2021-12-14 ENCOUNTER — Telehealth (INDEPENDENT_AMBULATORY_CARE_PROVIDER_SITE_OTHER): Payer: Self-pay

## 2021-12-14 DIAGNOSIS — Z09 Encounter for follow-up examination after completed treatment for conditions other than malignant neoplasm: Secondary | ICD-10-CM

## 2021-12-14 DIAGNOSIS — S52592D Other fractures of lower end of left radius, subsequent encounter for closed fracture with routine healing: Secondary | ICD-10-CM

## 2021-12-14 DIAGNOSIS — S52592A Other fractures of lower end of left radius, initial encounter for closed fracture: Secondary | ICD-10-CM

## 2021-12-14 NOTE — Progress Notes (Signed)
? ?Office Visit Note ?  ?Patient: April Beck           ?Date of Birth: 02-27-1946           ?MRN: [id number] ?Visit Date: 12/14/2021 ?             ?Requested by: Susy Frizzle, MD ?[address] [place],  [place] [address] ?PCP: Susy Frizzle, MD ? ? ?Assessment & Plan: ?Visit Diagnoses:  ?1. Other closed fracture of distal end of left radius, initial encounter   ? ? ?Plan: Patient is now 7 weeks out from her injury.  She has a minimally displaced radial styloid fracture with a small dorsal fracture fragment.  The fracture remains largely unchanged in alignment.  She has declined a cast since I started following this fracture and has chosen a removable wrist brace that she wears full time.  She can continue to wear this brace and I'll see her in another month for repeat x-rays.  ? ?Follow-Up Instructions: No follow-ups on file.  ? ?Orders:  ?Orders Placed This Encounter  ?Procedures  ? XR Wrist Complete Left  ? ?No orders of the defined types were placed in this encounter. ? ? ? ? Procedures: ?No procedures performed ? ? ?Clinical Data: ?No additional findings. ? ? ?Subjective: ?Chief Complaint  ?Patient presents with  ? Left Wrist - Follow-up  ? ? ?This is a 76 year old right-hand-dominant female who presents for follow-up of a left wrist fracture.  She is now approximately 7 weeks out from her injury.  She has been in a removable wrist brace.  She has declined a cast since the injury.  Her pain continues to improve.  She still has some stiffness with range of motion.  She is wearing her removable wrist brace full-time except to shower. ? ? ? ?Review of Systems ? ? ?Objective: ?Vital Signs: There were no vitals taken for this visit. ? ?Physical Exam ?Constitutional:   ?   Appearance: Normal appearance.  ?Cardiovascular:  ?   Rate and Rhythm: Normal rate.  ?   Pulses: Normal pulses.  ?Pulmonary:  ?   Effort: Pulmonary effort is normal.  ?Skin: ?   General: Skin is warm and dry.  ?   Capillary  Refill: Capillary refill takes less than 2 seconds.  ?Neurological:  ?   Mental Status: She is alert.  ? ? ?Left Hand Exam  ? ?Tenderness  ?Left hand tenderness location: TTP at radial metaphysis around radial styloid.  No swelling.  ? ?Other  ?Erythema: absent ?Sensation: normal ?Pulse: present ? ?Comments:  Minimal pain w/ PROM but slight soreness with terminal active extension.  ? ? ? ? ?Specialty Comments:  ?No specialty comments available. ? ?Imaging: ?No results found. ? ? ?PMFS History: ?Patient Active Problem List  ? Diagnosis Date Noted  ? Closed fracture of left distal radius 10/31/2021  ? Ovarian cyst 01/16/2021  ? Chronic constipation 11/21/2020  ? CKD (chronic kidney disease) stage 3, GFR 30-59 ml/min (HCC) 11/21/2020  ? Herpes 11/21/2020  ? Hypokalemia 11/21/2020  ? Acute blood loss anemia 11/21/2020  ? Weakness 11/17/2020  ? Aspiration into airway   ? Fall   ? Closed fracture of right hip ([place]) 11/12/2020  ? Dysphagia, unspecified(787.20) 06/14/2013  ? Heel pain 06/14/2013  ? Hypercholesteremia   ? Hypertension   ? GERD (gastroesophageal reflux disease)   ? Hypothyroidism   ? Renal insufficiency 06/19/2012  ? ?Past Medical History:  ?Diagnosis Date  ?  Anxiety   ? hx of panic attack  ? Arthritis   ? hands & knees  ? CKD (chronic kidney disease), stage III ([place])   ? followed by pcp  ? Edema of both lower extremities   ? GERD (gastroesophageal reflux disease)   ? History of 2019 novel coronavirus disease (COVID-19) 09/17/2020  ? positive result in epic,  per pt very mild symtpoms that resolved  ? History of uterine fibroid   ? Hypertension   ? followed by pcp  ? Hypomagnesemia   ? Hypothyroidism   ? followed by pcp  ? IDA (iron deficiency anemia)   ? Osteopenia   ? Ovarian cyst   ? Pelvic pain   ? TMJ (temporomandibular joint disorder)   ? per pt right side, takes meloxicam  ? Wears contact lenses   ?  ?Family History  ?Problem Relation Age of Onset  ? Colon polyps Brother   ? Breast cancer Maternal  Grandmother   ? Diabetes Brother   ? Hypertension Brother   ? Colon cancer Neg Hx   ? Esophageal cancer Neg Hx   ? Rectal cancer Neg Hx   ? Stomach cancer Neg Hx   ?  ?Past Surgical History:  ?Procedure Laterality Date  ? ABDOMINAL HYSTERECTOMY  1982  ? CHOLECYSTECTOMY  06/20/2012  ? Procedure: LAPAROSCOPIC CHOLECYSTECTOMY;  Surgeon: Zenovia Jarred, MD;  Location: [place];  Service: General;  Laterality: N/A;  ? COLONOSCOPY  last one 03-20-2019  dr Ardis Hughs  ? DIAGNOSTIC LAPAROSCOPY  yrs ago  ? HIP ARTHROPLASTY Right 11/13/2020  ? Procedure: ARTHROPLASTY BIPOLAR HIP (HEMIARTHROPLASTY);  Surgeon: Carole Civil, MD;  Location: [facility];  Service: Orthopedics;  Laterality: Right;  ? INCONTINENCE SURGERY  09-11-2005   dr Jeffie Pollock  '@[facility]'$   ? Janyth Pupa  ? LAPAROSCOPIC SALPINGO OOPHERECTOMY Bilateral 01/16/2021  ? Procedure: DIAGNOSTIC LAPAROSCOPY;  Surgeon: Molli Posey, MD;  Location: [facility];  Service: Gynecology;  Laterality: Bilateral;  ? LAPAROSCOPY Bilateral 01/16/2021  ? Procedure: EXPLORATORY LAPAROTOMY BILATERAL SALPINGO OOPHORECTOMY;  Surgeon: Molli Posey, MD;  Location: [facility];  Service: Gynecology;  Laterality: Bilateral;  ? TUBAL LIGATION    ? ?Social History  ? ?Occupational History  ? Not on file  ?Tobacco Use  ? Smoking status: Never  ? Smokeless tobacco: Never  ?Vaping Use  ? Vaping Use: Never used  ?Substance and Sexual Activity  ? Alcohol use: No  ? Drug use: Never  ? Sexual activity: Not on file  ? ? ? ? ? ? ?

## 2021-12-14 NOTE — Telephone Encounter (Signed)
Returned pt's call and let her know that script was sent to pharmacy today. Pt verbalized understanding.

## 2021-12-14 NOTE — Telephone Encounter (Signed)
Pt is calling back regarding the status for the glipiZIDE medication. Please advise and give the pt a call back on [phone] or [phone].            Thank you!

## 2021-12-20 DIAGNOSIS — Z20822 Contact with and (suspected) exposure to covid-19: Secondary | ICD-10-CM | POA: Diagnosis not present

## 2021-12-22 ENCOUNTER — Other Ambulatory Visit (INDEPENDENT_AMBULATORY_CARE_PROVIDER_SITE_OTHER): Payer: Self-pay | Admitting: Family

## 2021-12-22 ENCOUNTER — Encounter (INDEPENDENT_AMBULATORY_CARE_PROVIDER_SITE_OTHER): Payer: Self-pay | Admitting: Family

## 2021-12-22 ENCOUNTER — Ambulatory Visit (INDEPENDENT_AMBULATORY_CARE_PROVIDER_SITE_OTHER): Payer: 59 | Admitting: Family

## 2021-12-22 VITALS — BP 150/72 | HR 85 | Temp 97.5°F

## 2021-12-22 DIAGNOSIS — R002 Palpitations: Secondary | ICD-10-CM

## 2021-12-22 DIAGNOSIS — E1165 Type 2 diabetes mellitus with hyperglycemia: Secondary | ICD-10-CM

## 2021-12-22 DIAGNOSIS — R42 Dizziness and giddiness: Secondary | ICD-10-CM

## 2021-12-22 NOTE — Progress Notes (Signed)
The patient was informed that the following HM items are still outstanding:   Health Maintenance Due   Topic Date Due    Advance Directive on File  Never done    Shingrix Vaccine 50+ (1) Never done    DEPRESSION SCREENING  Never done    HEPATITIS C SCREENING  Never done    COVID-19 Vaccine (4 - Booster for Moderna series) 09/28/2020    DM OPHTHALMOLOGY EXAM  09/17/2021

## 2021-12-22 NOTE — Progress Notes (Signed)
[facility]  Helen [name]  PROGRESS NOTE      Patient: Helen [name]   Date: 12/22/2021   MRN: [id number]     Helen [name] is a 76 y.o. female    Chief Complaint   Patient presents with    Palpitations    Diabetes       MEDICATIONS     Current Outpatient Medications   Medication Sig Dispense Refill    albuterol (PROVENTIL) (2.5 MG/3ML) 0.083% nebulizer solution Take 3 mLs (2.5 mg total) by nebulization every 4 (four) hours as needed for Shortness of Breath or Wheezing 75 mL 1    albuterol sulfate HFA (PROVENTIL) 108 (90 Base) MCG/ACT inhaler Inhale 2 puffs into the lungs every 4 (four) hours as needed for Wheezing or Shortness of Breath 1 each 2    amLODIPine (NORVASC) 10 MG tablet Take 1 tablet (10 mg total) by mouth daily 90 tablet 3    atorvastatin (LIPITOR) 40 MG tablet TAKE 1 TABLET BY MOUTH EVERY DAY 90 tablet 1    Blood Glucose Monitoring Suppl (ONE TOUCH ULTRA 2) W/DEVICE KIT One daily 1 each 0    Cholecalciferol (Vitamin D3) 2000 UNIT capsule Take by mouth daily      diclofenac Sodium (VOLTAREN) 1 % Gel topical gel Apply 2 g topically 4 (four) times daily 50 g 0    ferrous sulfate 325 (65 FE) MG tablet ferrous sulfate 325 mg (65 mg iron) tablet      Flovent HFA 220 MCG/ACT inhaler INHALE 1 PUFF INTO THE LUNGS TWICE A DAY 12 each 2    fluticasone (FLONASE) 50 MCG/ACT nasal spray       glucose blood (OneTouch Ultra) test strip 2 times daily 200 each 3    Lancets (onetouch ultrasoft) lancets Use to check blood sugars 2 times daily 200 each 3    lidocaine (Lidoderm) 5 % Place 1 patch onto the skin daily as needed (pain) Remove & Discard patch within 12 hours 30 patch 0    meloxicam (MOBIC) 7.5 MG tablet TAKE 1 TABLET BY MOUTH EVERY DAY 90 tablet 0    metFORMIN (GLUCOPHAGE) 1000 MG tablet TAKE 1 TABLET BY MOUTH TWICE A DAY 180 tablet 1    montelukast (SINGULAIR) 10 MG tablet Take 10 mg by mouth daily      pantoprazole (PROTONIX) 40 MG tablet TAKE 1 TABLET BY MOUTH EVERY DAY 90 tablet 1    Tradjenta 5 MG Tab  TAKE 1 TABLET (5 MG TOTAL) BY MOUTH DAILY. 30 tablet 0    valsartan-hydroCHLOROthiazide (DIOVAN-HCT) 160-12.5 MG per tablet Take 1 tablet by mouth daily 90 tablet 3    Cholecalciferol (Vitamin D3) 50 MCG (2000 UT) Tab Take 1 tablet by mouth daily (Patient not taking: Reported on 12/22/2021) 90 tablet 3    desonide (DESOWEN) 0.05 % cream 1 APPLICATION APPLY ON THE SKIN TWICE A DAY APPLY TO FACE TWICE DAILY. (Patient not taking: Reported on 12/22/2021)      glipiZIDE (GLUCOTROL) 10 MG tablet TAKE 2 TABLETS (20 MG) BY MOUTH 2 (TWO) TIMES DAILY BEFORE MEALS 120 tablet 0    NASONEX 50 MCG/ACT nasal spray as needed.    (Patient not taking: Reported on 12/22/2021)  5     No current facility-administered medications for this visit.       Allergies   Allergen Reactions    Aspirin      Break out in sweat and dizziness     Peppers  Pollen Extract     Latex Itching and Rash       SUBJECTIVE     Chief Complaint   Patient presents with    Palpitations    Diabetes        HPI  1) DMII: uncontrolled. States she has been trying to est with endo and a nutritionist and has called multiple times but unable to secure appointments. Currently has appt scheduled for June. Checking sugars at home and reports 130-160- fasting. States she is still compliant with medications, watching what she eats and is exercising. States she is UTD with eye exam. She is ona statin.     2) Has had 2 episodes where it felt like heart was skipping a beat, then she got lightheaded, "everything went dark" then symptoms resolved. Denies any CP with this episode and states she did not have a syncopal episode. Denies SOB. States first time was 1 month ago, most recent 2 weeks ago.     3) HTN: will check occasionally at home and states it has been within normal range. Believes systolics have been 130s, unsure about diastolic.       ROS     Review of Systems   Constitutional:  Negative for fatigue, fever and unexpected weight change.   Respiratory:  Negative for cough  and shortness of breath.    Cardiovascular:  Positive for palpitations. Negative for chest pain and leg swelling.   Gastrointestinal:  Negative for nausea and vomiting.   Musculoskeletal:  Negative for neck pain.   Neurological:  Positive for light-headedness. Negative for syncope and headaches.       The following portions of the patient's history were reviewed and updated as appropriate: Allergies, Current Medications, Past Family History, Past Medical history, Past social history, Past surgical history, and Problem List.    PHYSICAL EXAM     Vitals:    12/22/21 1359   BP: 150/72   Pulse: 85   Temp: 97.5 F (36.4 C)   TempSrc: Temporal   SpO2: 98%       Physical Exam  Nursing note reviewed.   Constitutional:       General: She is awake. She is not in acute distress.     Appearance: Normal appearance.   Pulmonary:      Effort: Pulmonary effort is normal.   Neurological:      Mental Status: She is alert and oriented to person, place, and time.   Psychiatric:         Mood and Affect: Mood normal.         Speech: Speech normal.         Behavior: Behavior normal.         ASSESSMENT/PLAN        1. Uncontrolled type 2 diabetes mellitus with hyperglycemia  Hemoglobin A1C    Lipid panel    Urine Microalbumin Random  Chronic, uncontrolled. Overdue for labs. She still plans to est with endo and has that appt scheduled. Continue with dietary/ lifestyle compliance as well.       2. Palpitations  CBC and differential    Comprehensive metabolic panel    TSH, Abn Reflex to Free T4, Serum  Acute, unclear etiology. EKG without acute changes. She ahs appt with cardio scheduled. Screening labs as ordered. ER for any "red flag" symptoms as discussed.     Ddx: anxiety, paroxysmal afib, MAT, hyperthyroidism, globus sensation, PSVT, medication side effect  Risk & Benefits of the new medication(s) were explained to the patient (and family) who verbalized understanding & agreed to the treatment plan. Patient  (family) encouraged to contact me/clinical staff with any questions/concerns      No follow-ups on file.    Signed,  Myer Haff, FNP, FNP  12/22/2021

## 2021-12-25 ENCOUNTER — Other Ambulatory Visit: Payer: Medicare Other

## 2021-12-26 LAB — CBC AND DIFFERENTIAL
Baso(Absolute): 0.1 10*3/uL (ref 0.0–0.2)
Basophils Automated: 1 %
Eosinophils Absolute: 0.1 10*3/uL (ref 0.0–0.4)
Eosinophils Automated: 1 %
Hematocrit: 36 % (ref 34.0–46.6)
Hemoglobin: 11 g/dL — ABNORMAL LOW (ref 11.1–15.9)
Immature Granulocytes Absolute: 0 10*3/uL (ref 0.0–0.1)
Immature Granulocytes: 0 %
Lymphocytes Absolute: 1.9 10*3/uL (ref 0.7–3.1)
Lymphocytes Automated: 33 %
MCH: 22.6 pg — ABNORMAL LOW (ref 26.6–33.0)
MCHC: 30.6 g/dL — ABNORMAL LOW (ref 31.5–35.7)
MCV: 74 fL — ABNORMAL LOW (ref 79–97)
Monocytes Absolute: 0.8 10*3/uL (ref 0.1–0.9)
Monocytes: 14 %
Neutrophils Absolute Count: 2.9 10*3/uL (ref 1.4–7.0)
Neutrophils: 51 %
Platelets: 243 10*3/uL (ref 150–450)
RBC: 4.86 x10E6/uL (ref 3.77–5.28)
RDW: 13.2 % (ref 11.7–15.4)
WBC: 5.8 10*3/uL (ref 3.4–10.8)

## 2021-12-26 LAB — COMPREHENSIVE METABOLIC PANEL
ALT: 22 IU/L (ref 0–32)
AST (SGOT): 21 IU/L (ref 0–40)
Albumin/Globulin Ratio: 1.4 (ref 1.2–2.2)
Albumin: 4.4 g/dL (ref 3.7–4.7)
Alkaline Phosphatase: 76 IU/L (ref 44–121)
BUN / Creatinine Ratio: 21 (ref 12–28)
BUN: 16 mg/dL (ref 8–27)
Bilirubin, Total: 0.6 mg/dL (ref 0.0–1.2)
CO2: 24 mmol/L (ref 20–29)
Calcium: 9.5 mg/dL (ref 8.7–10.3)
Chloride: 98 mmol/L (ref 96–106)
Creatinine: 0.78 mg/dL (ref 0.57–1.00)
Globulin, Total: 3.1 g/dL (ref 1.5–4.5)
Glucose: 149 mg/dL — ABNORMAL HIGH (ref 70–99)
Potassium: 4.1 mmol/L (ref 3.5–5.2)
Protein, Total: 7.5 g/dL (ref 6.0–8.5)
Sodium: 136 mmol/L (ref 134–144)
eGFR: 79 mL/min/{1.73_m2} (ref >59)

## 2021-12-26 LAB — LIPID PANEL
Cholesterol / HDL Ratio: 1.9 ratio (ref 0.0–4.4)
Cholesterol: 140 mg/dL (ref 100–199)
HDL: 72 mg/dL (ref >39)
LDL Chol Calculated (NIH): 55 mg/dL (ref 0–99)
Triglycerides: 65 mg/dL (ref 0–149)
VLDL Calculated: 13 mg/dL (ref 5–40)

## 2021-12-26 LAB — MICROALBUMIN, RANDOM URINE
Creatinine, UR: 17.5 mg/dL
Microalb/Crt. Ratio: <17 mg/g creat (ref 0–29)
Microalbumin, UR: <3 ug/mL

## 2021-12-26 LAB — THYROID STIMULATING HORMONE (TSH), REFLEX ON ABNORMAL TO FREE T4, SERUM: TSH: 1.49 u[IU]/mL (ref 0.450–4.500)

## 2021-12-26 LAB — HEMOGLOBIN A1C: Hemoglobin A1C: 8.3 % — ABNORMAL HIGH (ref 4.8–5.6)

## 2021-12-27 ENCOUNTER — Other Ambulatory Visit (INDEPENDENT_AMBULATORY_CARE_PROVIDER_SITE_OTHER): Payer: Self-pay | Admitting: Family

## 2021-12-28 DIAGNOSIS — L818 Other specified disorders of pigmentation: Secondary | ICD-10-CM | POA: Diagnosis not present

## 2021-12-28 DIAGNOSIS — D2272 Melanocytic nevi of left lower limb, including hip: Secondary | ICD-10-CM | POA: Diagnosis not present

## 2021-12-28 DIAGNOSIS — D485 Neoplasm of uncertain behavior of skin: Secondary | ICD-10-CM | POA: Diagnosis not present

## 2021-12-29 ENCOUNTER — Ambulatory Visit (INDEPENDENT_AMBULATORY_CARE_PROVIDER_SITE_OTHER): Payer: Medicare Other | Admitting: Family

## 2022-01-01 ENCOUNTER — Encounter (INDEPENDENT_AMBULATORY_CARE_PROVIDER_SITE_OTHER): Payer: Self-pay | Admitting: Family

## 2022-01-01 ENCOUNTER — Ambulatory Visit (INDEPENDENT_AMBULATORY_CARE_PROVIDER_SITE_OTHER): Payer: 59 | Admitting: Family

## 2022-01-01 VITALS — BP 124/66 | HR 85 | Temp 98.1°F | Wt 164.4 lb

## 2022-01-01 DIAGNOSIS — G8929 Other chronic pain: Secondary | ICD-10-CM

## 2022-01-01 DIAGNOSIS — E1165 Type 2 diabetes mellitus with hyperglycemia: Secondary | ICD-10-CM

## 2022-01-01 DIAGNOSIS — E7800 Pure hypercholesterolemia, unspecified: Secondary | ICD-10-CM

## 2022-01-01 DIAGNOSIS — M25552 Pain in left hip: Secondary | ICD-10-CM

## 2022-01-01 DIAGNOSIS — I10 Essential (primary) hypertension: Secondary | ICD-10-CM

## 2022-01-01 MED ORDER — GLIPIZIDE 10 MG PO TABS
20.0000 mg | ORAL_TABLET | Freq: Two times a day (BID) | ORAL | 0 refills | Status: DC
Start: 2022-01-01 — End: 2022-04-23

## 2022-01-01 MED ORDER — ATORVASTATIN CALCIUM 40 MG PO TABS
40.0000 mg | ORAL_TABLET | Freq: Every day | ORAL | 1 refills | Status: DC
Start: 2022-01-01 — End: 2022-09-26

## 2022-01-01 MED ORDER — VALSARTAN-HYDROCHLOROTHIAZIDE 160-12.5 MG PO TABS
1.0000 | ORAL_TABLET | Freq: Every day | ORAL | 3 refills | Status: DC
Start: 2022-01-01 — End: 2023-01-15

## 2022-01-01 NOTE — Progress Notes (Signed)
[facility]  Laurell Josephs  PROGRESS NOTE      Patient: Helen [name]   Date: 01/02/2022   MRN: [id number]     Helen [name] is a 76 y.o. female    Chief Complaint   Patient presents with    Hip Pain     Follow-up on left hip pain.        MEDICATIONS     Current Outpatient Medications   Medication Sig Dispense Refill    albuterol (PROVENTIL) (2.5 MG/3ML) 0.083% nebulizer solution Take 3 mLs (2.5 mg total) by nebulization every 4 (four) hours as needed for Shortness of Breath or Wheezing 75 mL 1    albuterol sulfate HFA (PROVENTIL) 108 (90 Base) MCG/ACT inhaler Inhale 2 puffs into the lungs every 4 (four) hours as needed for Wheezing or Shortness of Breath 1 each 2    amLODIPine (NORVASC) 10 MG tablet Take 1 tablet (10 mg total) by mouth daily 90 tablet 3    Blood Glucose Monitoring Suppl (ONE TOUCH ULTRA 2) W/DEVICE KIT One daily 1 each 0    Cholecalciferol (Vitamin D3) 2000 UNIT capsule Take by mouth daily      desonide (DESOWEN) 0.05 % cream       ferrous sulfate 325 (65 FE) MG tablet ferrous sulfate 325 mg (65 mg iron) tablet      Flovent HFA 220 MCG/ACT inhaler INHALE 1 PUFF INTO THE LUNGS TWICE A DAY 12 each 2    fluocinonide (LIDEX) 0.05 % gel       fluticasone (FLONASE) 50 MCG/ACT nasal spray       glucose blood (OneTouch Ultra) test strip 2 times daily 200 each 3    Lancets (onetouch ultrasoft) lancets Use to check blood sugars 2 times daily 200 each 3    lidocaine (Lidoderm) 5 % Place 1 patch onto the skin daily as needed (pain) Remove & Discard patch within 12 hours 30 patch 0    metFORMIN (GLUCOPHAGE) 1000 MG tablet TAKE 1 TABLET BY MOUTH TWICE A DAY 180 tablet 1    montelukast (SINGULAIR) 10 MG tablet Take 10 mg by mouth daily      pantoprazole (PROTONIX) 40 MG tablet TAKE 1 TABLET BY MOUTH EVERY DAY 90 tablet 1    Tradjenta 5 MG Tab TAKE 1 TABLET (5 MG TOTAL) BY MOUTH DAILY. 90 tablet 0    atorvastatin (LIPITOR) 40 MG tablet Take 1 tablet (40 mg) by mouth daily 90 tablet 1    Cholecalciferol (Vitamin  D3) 50 MCG (2000 UT) Tab Take 1 tablet by mouth daily (Patient not taking: Reported on 12/22/2021) 90 tablet 3    dexAMETHasone (DECADRON) 0.1 % ophthalmic solution APPLY 1 DROP IN BOTH EYES 3 TIMES A DAY FOR 10 DAYS (Patient not taking: Reported on 01/01/2022)      diclofenac Sodium (VOLTAREN) 1 % Gel topical gel Apply 2 g topically 4 (four) times daily (Patient not taking: Reported on 01/01/2022) 50 g 0    diphenoxylate-atropine (LOMOTIL) 2.5-0.025 MG per tablet  (Patient not taking: Reported on 01/01/2022)      glipiZIDE (GLUCOTROL) 10 MG tablet Take 2 tablets (20 mg) by mouth 2 (two) times daily before meals 360 tablet 0    meloxicam (MOBIC) 7.5 MG tablet TAKE 1 TABLET BY MOUTH EVERY DAY (Patient not taking: Reported on 01/01/2022) 90 tablet 0    NASONEX 50 MCG/ACT nasal spray as needed.    (Patient not taking: Reported on 12/22/2021)  5  nitrofurantoin, macrocrystal-monohydrate, (MACROBID) 100 MG capsule  (Patient not taking: Reported on 01/01/2022)      predniSONE (DELTASONE) 10 MG tablet  (Patient not taking: Reported on 01/01/2022)      valsartan-hydroCHLOROthiazide (DIOVAN-HCT) 160-12.5 MG per tablet Take 1 tablet by mouth daily 90 tablet 3     No current facility-administered medications for this visit.       Allergies   Allergen Reactions    Aspirin      Break out in sweat and dizziness     Peppers     Pollen Extract     Latex Itching and Rash       SUBJECTIVE     Chief Complaint   Patient presents with    Hip Pain     Follow-up on left hip pain.         HPI  2 months of left hip pain, radiates into left upper thigh; no swelling or color change noted. Can feel like a shooting pain. Worse at night, walking and twisting. Has a hard time turning over and laying on it in bed. States pain is about 8/10 at its worst.     Denies knee pain. Does have chronic lumbar pain. 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 Denies any change from baseline    Using icy hot, heat. Pt tries to avoid pain medication.     ROS     Review of Systems   Cardiovascular:  Negative for leg swelling.   Gastrointestinal:  Negative for abdominal pain, diarrhea, nausea and vomiting.   Musculoskeletal:  Positive for arthralgias, back pain and gait problem. Negative for joint swelling.   Skin:  Negative for color change and wound.   Neurological:  Negative for weakness and numbness.             The following portions of the patient's history were reviewed and updated as appropriate: Allergies, Current Medications, Past Family History, Past Medical history, Past social history, Past surgical history, and Problem List.    PHYSICAL EXAM     Vitals:    01/01/22 1054   BP: 124/66   BP Site: Left arm   Patient Position: Sitting   Cuff Size: Medium   Pulse: 85   Temp: 98.1 F (36.7 C)   TempSrc: Oral   SpO2: 96%   Weight: 74.6 kg (164 lb 6.4 oz)           Physical Exam  Vitals and nursing note reviewed.   Constitutional:       General: She is awake. She is not in acute distress.     Appearance: Normal appearance.   Pulmonary:      Effort: Pulmonary effort is normal.   Musculoskeletal:      Cervical back: Normal.      Thoracic back: Normal.      Lumbar back: Tenderness present. No deformity. Decreased range of motion. Positive left straight leg raise test.      Left hip: No tenderness. Decreased range of motion. Normal strength.      Comments: Limps, walks with cane   Skin:     General: Skin is warm and dry.      Capillary Refill: Capillary refill takes less than 2 seconds.   Neurological:      Mental Status: She is alert and oriented to person, place, and time.   Psychiatric:  Mood and Affect: Mood normal.         Speech: Speech normal.         Behavior: Behavior normal.         ASSESSMENT/PLAN        1. Chronic left hip pain  XR Hip left 2-3 vw with pelvis  Chronic, worsening. Discussed with pt that pain could be originating from either  her hip or from her chronic back issues. As she is UTD with lumbar imaging will proceed with hip xray as ordered. Pt does not want to try pain medication at this time (discussed gabapentin) and she will continue with more conservative measures. Plan for pt to consult with pain management again at [facility] as she has seen them in the past.       2. Uncontrolled type 2 diabetes mellitus with hyperglycemia  glipiZIDE (GLUCOTROL) 10 MG tablet  Chronic, pt will consult with endo as scheduled in June.       3. Primary hypertension  valsartan-hydroCHLOROthiazide (DIOVAN-HCT) 160-12.5 MG per tablet  Chronic, stable. Med refilled.       4. Pure hypercholesterolemia  atorvastatin (LIPITOR) 40 MG tablet  Chronic, stable. Med refilled.         Reviewed med use and side effects. Reviewed s/s that would warrant further and/ or immediate medical attention. Pt in agreement with plan and all questions answered.       Risk & Benefits of the new medication(s) were explained to the patient (and family) who verbalized understanding & agreed to the treatment plan. Patient (family) encouraged to contact me/clinical staff with any questions/concerns      Return if symptoms worsen or fail to improve.    Signed,  Myer Haff, FNP, FNP  01/02/2022

## 2022-01-01 NOTE — Progress Notes (Unsigned)
Have you seen any specialists/other providers since your last visit with [place]?    Yes  Mammogram       The patient was informed that the following HM items are still outstanding:   Health Maintenance Due   Topic Date Due    Advance Directive on File  Never done    Shingrix Vaccine 50+ (1) Never done    DEPRESSION SCREENING  Never done    HEPATITIS C SCREENING  Never done    COVID-19 Vaccine (4 - Booster for Moderna series) 09/28/2020    DM OPHTHALMOLOGY EXAM  09/17/2021

## 2022-01-04 ENCOUNTER — Ambulatory Visit
Admission: RE | Admit: 2022-01-04 | Discharge: 2022-01-04 | Disposition: A | Discharge status: Home / Self Care | Payer: 59 | Source: Ambulatory Visit | Attending: Family | Admitting: Family

## 2022-01-04 DIAGNOSIS — M25552 Pain in left hip: Secondary | ICD-10-CM | POA: Insufficient documentation

## 2022-01-04 DIAGNOSIS — G8929 Other chronic pain: Secondary | ICD-10-CM | POA: Insufficient documentation

## 2022-01-05 ENCOUNTER — Telehealth: Payer: Self-pay | Admitting: *Deleted

## 2022-01-05 NOTE — Chronic Care Management (AMB) (Signed)
?  Care Management  ? ?Note ? ?01/05/2022 ?Name: [name] MRN: [id number] DOB: 26-Mar-1946 ? ?April Beck is a 76 y.o. year old female who is a primary care patient of Dennard Schaumann, Cammie Mcgee, MD. I reached out to Azalee Course by phone today offer care coordination services.  ? ?April Beck was given information about care management services today including:  ?Care management services include personalized support from designated clinical staff supervised by her physician, including individualized plan of care and coordination with other care providers ?24/7 contact phone numbers for assistance for urgent and routine care needs. ?The patient may stop care management services at any time by phone call to the office staff. ? ?Patient agreed to services and verbal consent obtained.  ? ?Follow up plan: ?Telephone appointment with care management team member scheduled for:01/15/22 ? ?Laverda Sorenson  ?Care Guide, Embedded Care Coordination ?[place]  Care Management  ?Direct Dial: [phone] ? ?

## 2022-01-05 NOTE — Chronic Care Management (AMB) (Signed)
?  Care Management  ? ?Outreach Note ? ?01/05/2022 ?Name: April Beck MRN: [id number] DOB: [birth year] ? ? ?Reason for outreach : Care Coordination (Initial outreach to schedule initial call with [facility] ) ? ? ?An unsuccessful telephone outreach was attempted today. The patient was referred to the case management team for assistance with care management and care coordination.  ? ?Follow Up Plan:  ?A HIPAA compliant phone message was left for the patient providing contact information and requesting a return call.  ?The care management team will reach out to the patient again over the next 7 days.  ?If patient returns call to provider office, please advise to call [place]* at [phone].* ? ?Laverda Sorenson  ?Care Guide, Embedded Care Coordination ?[place]  Care Management  ?Direct Dial: [phone] ? ?

## 2022-01-12 ENCOUNTER — Ambulatory Visit: Payer: Medicare Other | Admitting: Orthopedic Surgery

## 2022-01-15 ENCOUNTER — Ambulatory Visit: Payer: Medicare Other | Admitting: *Deleted

## 2022-01-15 DIAGNOSIS — N1831 Chronic kidney disease, stage 3a: Secondary | ICD-10-CM

## 2022-01-15 DIAGNOSIS — E7800 Pure hypercholesterolemia, unspecified: Secondary | ICD-10-CM

## 2022-01-15 NOTE — Patient Instructions (Signed)
Visit Information ? ?Thank you for taking time to visit with me today. Please don't hesitate to contact me if I can be of assistance to you before our next scheduled telephone appointment. ? ?Following are the goals we discussed today:  ?Take medications as prescribed   ?Attend all scheduled provider appointments ?Call pharmacy for medication refills 3-7 days in advance of running out of medications ?Attend church or other social activities ?Perform all self care activities independently  ?Perform IADL's (shopping, preparing meals, housekeeping, managing finances) independently ?Call provider office for new concerns or questions  ?call for medicine refill 2 or 3 days before it runs out ?take all medications exactly as prescribed ?call doctor with any symptoms you believe are related to your medicine ?call doctor when you experience any new symptoms ?go to all doctor appointments as scheduled ?adhere to prescribed diet: heart healthy ?Look over education sent via My Chart- heart healthy diet and Chronic kidney disease ?Drink adequate fluids, preferably water ?Continue getting outside daily and working around your yard and walking ?Keep blood pressure within normal limits ? ?Our next appointment is by telephone on 04/06/22 at 9 am ? ?Please call the care guide team at [phone] if you need to cancel or reschedule your appointment.  ? ?If you are experiencing a Mental Health or [place] or need someone to talk to, please call the Suicide and Crisis Lifeline: 988 ?call the [place] National Suicide Prevention Lifeline: 367-375-1957 or TTY: 8288127047 TTY 754-723-3329) to talk to a trained counselor ?call 1-800-273-TALK (toll free, 24 hour hotline) ?go to [facility] [address] [place] [phone]) ?call 911  ? ?Following is a copy of your full plan of care:  ?Care Plan : [place] of Care  ?Updates made by [name], RN since 01/15/2022  12:00 AM  ?  ? ?Problem: No plan of care established for management of chronic disease state (HLD, CKD3)   ?Priority: High  ?  ? ?Long-Range Goal: Development of plan of care for chronic disease management  (HLD, CKD3)   ?Start Date: 01/15/2022  ?Expected End Date: 07/14/2022  ?Priority: High  ?Note:   ?Current Barriers:  ?Knowledge Deficits related to plan of care for management of HLD and CKD Stage 3  ?Chronic Disease Management support and education needs related to HLD and CKD Stage 3 ?Patient reports she lives alone, is independent with all aspects of her care, continues to do yard work and mow the yard, walks when she can, continues to drive, has niece and son and daughter in law she can call on if needed. ?Patient reports she tries to be careful with her diet and has concern about "chronic kidney disease" and does not want this to get worse. ?RNCM Clinical Goal(s):  ?Patient will verbalize understanding of plan for management of HLD and CKD Stage 3 as evidenced by patient report, review of EHR and  through collaboration with RN Care manager, provider, and care team.  ? ?Interventions: ?1:1 collaboration with primary care provider regarding development and update of comprehensive plan of care as evidenced by provider attestation and co-signature ?Inter-disciplinary care team collaboration (see longitudinal plan of care) ?Evaluation of current treatment plan related to  self management and patient's adherence to plan as established by provider ? ? ? Chronic Kidney Disease Interventions:  (Status:  New goal. and Goal on track:  Yes.) Long Term Goal ?Assessed the Patient understanding of chronic kidney disease    ?Evaluation  of current treatment plan related to chronic kidney disease self management and patient's adherence to plan as established by provider      ?Reviewed prescribed diet heart healthy ?Counseled on the importance of exercise goals with target of 150 minutes per week     ?Discussed complications of  poorly controlled blood pressure such as heart disease, stroke, circulatory complications, vision complications, kidney impairment, sexual dysfunction    ?Discussed plans with patient for ongoing care management follow up and provided patient with direct contact information for care management team    ?Reviewed importance of keeping blood pressure within normal limits ?Education provided via My Chart- CKD ?Last practice recorded BP readings:  ?BP Readings from Last 3 Encounters:  ?10/27/21 (!) 152/82  ?10/09/21 (!) 142/88  ?05/16/21 126/82  ?Most recent eGFR/CrCl:  ?Lab Results  ?Component Value Date  ? EGFR 53 (L) 05/16/2021  ?  No components found for: CRCL  ? ?Hyperlipidemia:  (Status: New goal. Goal on Track (progressing): YES.) Long Term Goal  ?Lab Results  ?Component Value Date  ? CHOL 138 03/31/2021  ? HDL 55 03/31/2021  ? [place] 67 03/31/2021  ? TRIG 82 03/31/2021  ? CHOLHDL 2.5 03/31/2021  ?  ? ?Medication review performed; medication list updated in electronic medical record.  ?Provider established cholesterol goals reviewed; ?Counseled on importance of regular laboratory monitoring as prescribed; ?Provided HLD educational materials; ?Reviewed importance of limiting foods high in cholesterol; ?Reviewed exercise goals and target of 150 minutes per week; ?Screening for signs and symptoms of depression related to chronic disease state;  ?Assessed social determinant of health barriers;  ?Education provided via My Chart- heart healthy diet ?Keep doing your outdoor activities such as working in your yard, [place], etc. ? ?Patient Goals/Self-Care Activities: ?Take medications as prescribed   ?Attend all scheduled provider appointments ?Call pharmacy for medication refills 3-7 days in advance of running out of medications ?Attend church or other social activities ?Perform all self care activities independently  ?Perform IADL's (shopping, preparing meals, housekeeping, managing finances) independently ?Call  provider office for new concerns or questions  ?- call for medicine refill 2 or 3 days before it runs out ?- take all medications exactly as prescribed ?- call doctor with any symptoms you believe are related to your medicine ?- call doctor when you experience any new symptoms ?- go to all doctor appointments as scheduled ?- adhere to prescribed diet: heart healthy ?Look over education sent via My Chart- heart healthy diet and Chronic kidney disease ?Drink adequate fluids, preferably water ?Continue getting outside daily and working around your yard and walking ?Keep blood pressure within normal limits ? ?  ?  ? ? ?Ms. Nield was given information about Care Management services by the embedded care coordination team including:  ?Care Management services include personalized support from designated clinical staff supervised by her physician, including individualized plan of care and coordination with other care providers ?24/7 contact phone numbers for assistance for urgent and routine care needs. ?The patient may stop CCM services at any time (effective at the end of the month) by phone call to the office staff. ? ?Patient agreed to services and verbal consent obtained.  ? ?Patient verbalizes understanding of instructions and care plan provided today and agrees to view in [place]. Active MyChart status confirmed with patient.   ? ?Telephone follow up appointment with care management team member scheduled for: 04/06/22 ? ?Chronic Kidney Disease, Adult ?Chronic kidney disease (CKD) occurs when the kidneys are slowly and  permanently damaged over a long period of time. The kidneys are a pair of organs that do many important jobs in the body, including: ?Removing waste and extra fluid from the blood to make urine. ?Making hormones that maintain the amount of fluid in tissues and blood vessels. ?Maintaining the right amount of fluids and chemicals in the body. ?A small amount of kidney damage may not cause problems, but a  large amount of damage may make it hard or impossible for the kidneys to work right. Steps must be taken to slow kidney damage or to stop it from getting worse. If steps are not taken, the kidneys may stop work

## 2022-01-15 NOTE — Chronic Care Management (AMB) (Signed)
? Care Management ?  ? RN Visit Note ? ?01/15/2022 ?Name: April Beck MRN: [id number] DOB: 1945/12/24 ? ?Subjective: ?April Beck is a 76 y.o. year old female who is a primary care patient of Pickard, Cammie Mcgee, MD. The care management team was consulted for assistance with disease management and care coordination needs.   ? ?Engaged with patient by telephone for initial visit in response to provider referral for case management and/or care coordination services.  ? ?Consent to Services:  ? April Beck was given information about Care Management services today including:  ?Care Management services includes personalized support from designated clinical staff supervised by her physician, including individualized plan of care and coordination with other care providers ?24/7 contact phone numbers for assistance for urgent and routine care needs. ?The patient may stop case management services at any time by phone call to the office staff. ? ?Patient agreed to services and consent obtained.  ? ?Assessment: Review of patient past medical history, allergies, medications, health status, including review of consultants reports, laboratory and other test data, was performed as part of comprehensive evaluation and provision of chronic care management services.  ? ?SDOH (Social Determinants of Health) assessments and interventions performed:  ?SDOH Interventions   ? ?Flowsheet Row Most Recent Value  ?SDOH Interventions   ?Food Insecurity Interventions Intervention Not Indicated  ?Transportation Interventions Intervention Not Indicated  ? ?  ?  ? ?Care Plan ? ?No Known Allergies ? ?Outpatient Encounter Medications as of 01/15/2022  ?Medication Sig  ? acetaminophen (TYLENOL) 325 MG tablet Take 2 tablets (650 mg total) by mouth every 6 (six) hours as needed for pain.  ? acyclovir (ZOVIRAX) 400 MG tablet TAKE 1 TABLET EVERY DAY  ? atorvastatin (LIPITOR) 40 MG tablet TAKE 1 TABLET EVERY DAY  ? calcium-vitamin D (OSCAL WITH D)  250-125 MG-UNIT tablet Take 1 tablet by mouth daily.  ? citalopram (CELEXA) 10 MG tablet TAKE 1 TABLET EVERY DAY  ? diphenoxylate-atropine (LOMOTIL) 2.5-0.025 MG tablet TAKE 1 TABLET BY MOUTH 4 (FOUR) TIMES DAILY AS NEEDED FOR DIARRHEA OR LOOSE STOOLS.  ? fluocinonide gel (LIDEX) 5.63 % Apply 1 application topically 3 (three) times daily as needed (rash).  ? hydrochlorothiazide (HYDRODIURIL) 25 MG tablet TAKE 1 TABLET EVERY DAY  ? levothyroxine (SYNTHROID) 50 MCG tablet TAKE 1 TABLET EVERY DAY  ? meloxicam (MOBIC) 15 MG tablet TAKE 1 TABLET EVERY DAY  ? pantoprazole (PROTONIX) 40 MG tablet TAKE 1 TABLET TWICE DAILY  ? potassium chloride SA (KLOR-CON) 20 MEQ tablet TAKE 1 TABLET EVERY DAY  ? ?No facility-administered encounter medications on file as of 01/15/2022.  ? ? ?Patient Active Problem List  ? Diagnosis Date Noted  ? Closed fracture of left distal radius 10/31/2021  ? Ovarian cyst 01/16/2021  ? Chronic constipation 11/21/2020  ? CKD (chronic kidney disease) stage 3, GFR 30-59 ml/min (HCC) 11/21/2020  ? Herpes 11/21/2020  ? Hypokalemia 11/21/2020  ? Acute blood loss anemia 11/21/2020  ? Weakness 11/17/2020  ? Aspiration into airway   ? Fall   ? Closed fracture of right hip ([place]) 11/12/2020  ? Dysphagia, unspecified(787.20) 06/14/2013  ? Heel pain 06/14/2013  ? Hypercholesteremia   ? Hypertension   ? GERD (gastroesophageal reflux disease)   ? Hypothyroidism   ? Renal insufficiency 06/19/2012  ? ? ?Conditions to be addressed/monitored: HLD and CKD Stage 3 ? ?Care Plan : RN Care Manager Plan of Care  ?Updates made by [name], RN since 01/15/2022 12:00 AM  ?  ? ?  Problem: No plan of care established for management of chronic disease state (HLD, CKD3)   ?Priority: High  ?  ? ?Long-Range Goal: Development of plan of care for chronic disease management  (HLD, CKD3)   ?Start Date: 01/15/2022  ?Expected End Date: 07/14/2022  ?Priority: High  ?Note:   ?Current Barriers:  ?Knowledge Deficits related to plan of care  for management of HLD and CKD Stage 3  ?Chronic Disease Management support and education needs related to HLD and CKD Stage 3 ?Patient reports she lives alone, is independent with all aspects of her care, continues to do yard work and mow the yard, walks when she can, continues to drive, has niece and son and daughter in law she can call on if needed. ?Patient reports she tries to be careful with her diet and has concern about "chronic kidney disease" and does not want this to get worse. ?RNCM Clinical Goal(s):  ?Patient will verbalize understanding of plan for management of HLD and CKD Stage 3 as evidenced by patient report, review of EHR and  through collaboration with RN Care manager, provider, and care team.  ? ?Interventions: ?1:1 collaboration with primary care provider regarding development and update of comprehensive plan of care as evidenced by provider attestation and co-signature ?Inter-disciplinary care team collaboration (see longitudinal plan of care) ?Evaluation of current treatment plan related to  self management and patient's adherence to plan as established by provider ? ? ? Chronic Kidney Disease Interventions:  (Status:  New goal. and Goal on track:  Yes.) Long Term Goal ?Assessed the Patient understanding of chronic kidney disease    ?Evaluation of current treatment plan related to chronic kidney disease self management and patient's adherence to plan as established by provider      ?Reviewed prescribed diet heart healthy ?Counseled on the importance of exercise goals with target of 150 minutes per week     ?Discussed complications of poorly controlled blood pressure such as heart disease, stroke, circulatory complications, vision complications, kidney impairment, sexual dysfunction    ?Discussed plans with patient for ongoing care management follow up and provided patient with direct contact information for care management team    ?Reviewed importance of keeping blood pressure within normal  limits ?Education provided via My Chart- CKD ?Last practice recorded BP readings:  ?BP Readings from Last 3 Encounters:  ?10/27/21 (!) 152/82  ?10/09/21 (!) 142/88  ?05/16/21 126/82  ?Most recent eGFR/CrCl:  ?Lab Results  ?Component Value Date  ? EGFR 53 (L) 05/16/2021  ?  No components found for: CRCL  ? ?Hyperlipidemia:  (Status: New goal. Goal on Track (progressing): YES.) Long Term Goal  ?Lab Results  ?Component Value Date  ? CHOL 138 03/31/2021  ? HDL 55 03/31/2021  ? [place] 67 03/31/2021  ? TRIG 82 03/31/2021  ? CHOLHDL 2.5 03/31/2021  ?  ? ?Medication review performed; medication list updated in electronic medical record.  ?Provider established cholesterol goals reviewed; ?Counseled on importance of regular laboratory monitoring as prescribed; ?Provided HLD educational materials; ?Reviewed importance of limiting foods high in cholesterol; ?Reviewed exercise goals and target of 150 minutes per week; ?Screening for signs and symptoms of depression related to chronic disease state;  ?Assessed social determinant of health barriers;  ?Education provided via My Chart- heart healthy diet ?Keep doing your outdoor activities such as working in your yard, [place], etc. ? ?Patient Goals/Self-Care Activities: ?Take medications as prescribed   ?Attend all scheduled provider appointments ?Call pharmacy for medication refills 3-7 days in  advance of running out of medications ?Attend church or other social activities ?Perform all self care activities independently  ?Perform IADL's (shopping, preparing meals, housekeeping, managing finances) independently ?Call provider office for new concerns or questions  ?- call for medicine refill 2 or 3 days before it runs out ?- take all medications exactly as prescribed ?- call doctor with any symptoms you believe are related to your medicine ?- call doctor when you experience any new symptoms ?- go to all doctor appointments as scheduled ?- adhere to prescribed diet: heart  healthy ?Look over education sent via My Chart- heart healthy diet and Chronic kidney disease ?Drink adequate fluids, preferably water ?Continue getting outside daily and working around your yard and walking ?Keep b

## 2022-01-16 ENCOUNTER — Ambulatory Visit (INDEPENDENT_AMBULATORY_CARE_PROVIDER_SITE_OTHER): Payer: Medicare Other | Admitting: Orthopedic Surgery

## 2022-01-16 ENCOUNTER — Ambulatory Visit: Payer: Medicare Other | Admitting: Orthopedic Surgery

## 2022-01-16 ENCOUNTER — Ambulatory Visit: Payer: Self-pay

## 2022-01-16 ENCOUNTER — Encounter: Payer: Self-pay | Admitting: Orthopedic Surgery

## 2022-01-16 DIAGNOSIS — M1812 Unilateral primary osteoarthritis of first carpometacarpal joint, left hand: Secondary | ICD-10-CM | POA: Insufficient documentation

## 2022-01-16 DIAGNOSIS — S52592A Other fractures of lower end of left radius, initial encounter for closed fracture: Secondary | ICD-10-CM

## 2022-01-16 MED ORDER — LIDOCAINE HCL 1 % IJ SOLN
0.5000 mL | INTRAMUSCULAR | Status: AC | PRN
  Administered 2022-01-16: .5 mL

## 2022-01-16 MED ORDER — BETAMETHASONE SOD PHOS & ACET 6 (3-3) MG/ML IJ SUSP
3.0000 mg | INTRAMUSCULAR | Status: AC | PRN
  Administered 2022-01-16: 3 mg via INTRA_ARTICULAR

## 2022-01-16 NOTE — Progress Notes (Signed)
? ?Office Visit Note ?  ?Patient: April Beck           ?Date of Birth: 1946-05-08           ?MRN: [id number] ?Visit Date: 01/16/2022 ?             ?Requested by: Susy Frizzle, MD ?[address] [place],  [place] [address] ?PCP: Susy Frizzle, MD ? ? ?Assessment & Plan: ?Visit Diagnoses:  ?1. Other closed fracture of distal end of left radius, initial encounter   ?2. Arthritis of carpometacarpal (CMC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   ? ? ?Plan: Patient is now 11-1/2 weeks out from her injury.  We reviewed today's x-rays which show interval healing of the radial styloid fracture.  There may be positional, but is difficult to appreciate that dorsal distal radius fragment.  Her biggest issue today seems to be pain at the base of her thumb.  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  We discussed the nature of CMC arthritis as well as its diagnosis, prognosis, and both conservative and surgical treatment options.  After discussion, she would like to try corticosteroid injection into the thumb CMC joint.  I can see her back again as needed. ? ?Follow-Up Instructions: No follow-ups on file.  ? ?Orders:  ?Orders Placed This Encounter  ?Procedures  ? XR Wrist Complete Left  ? ?No orders of the defined types were placed in this encounter. ? ? ? ? Procedures: ?Hand/UE Inj: L thumb CMC for osteoarthritis on 01/16/2022 5:28 PM ?Indications: therapeutic and pain ?Details: 25 G needle, fluoroscopy-guided radial approach ?Medications: 0.5 mL lidocaine 1 %; 3 mg betamethasone acetate-betamethasone sodium phosphate 6 (3-3) MG/ML ?Procedure, treatment alternatives, risks and benefits explained, specific risks discussed. Consent was given by the patient. Immediately prior to procedure a time out was called to verify the correct patient, procedure, equipment, support staff and site/side marked as required. Patient was prepped and draped in the usual sterile fashion.  ? ? ? ? ?Clinical Data: ?No  additional findings. ? ? ?Subjective: ?Chief Complaint  ?Patient presents with  ? Left Wrist - Follow-up, Fracture  ? ? ?This is a 76 year old right-hand-dominant female presents for follow-up of left wrist fracture.  She is approximately 11-1/2 weeks out from her injury.  She is been in a removable wrist brace.  She declined a cast initially.  The pain in her wrist has improved somewhat but her biggest issue today is pain at the base of her thumb.  This is worse with certain activities that involve gripping or grasping and is localized to the [facility] joint.. ? ? ?Review of Systems ? ? ?Objective: ?Vital Signs: There were no vitals taken for this visit. ? ?Physical Exam ? ?Right Hand Exam  ? ?Tenderness  ?Right hand tenderness location: TTP at base of thumb at [facility] joint.  No TTP at radial styloid. ? ?Other  ?Erythema: absent ?Sensation: normal ?Pulse: present ? ?Comments:  Pain and crepitus with CMC grind test.  No static or dynamic MP hyper-extension.  ? ? ? ? ?Specialty Comments:  ?No specialty comments available. ? ?Imaging: ?No results found. ? ? ?PMFS History: ?Patient Active Problem List  ? Diagnosis Date Noted  ? Arthritis of carpometacarpal [facility]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 01/16/2022  ? Closed fracture of left distal radius 10/31/2021  ? Ovarian cyst 01/16/2021  ? Chronic constipation 11/21/2020  ? CKD (chronic kidney disease) stage 3, GFR 30-59 ml/min (  [place]) 11/21/2020  ? Herpes 11/21/2020  ? Hypokalemia 11/21/2020  ? Acute blood loss anemia 11/21/2020  ? Weakness 11/17/2020  ? Aspiration into airway   ? Fall   ? Closed fracture of right hip ([place]) 11/12/2020  ? Dysphagia, unspecified(787.20) 06/14/2013  ? Heel pain 06/14/2013  ? Hypercholesteremia   ? Hypertension   ? GERD (gastroesophageal reflux disease)   ? Hypothyroidism   ? Renal insufficiency 06/19/2012  ? ?Past Medical History:  ?Diagnosis Date  ? Anxiety   ? hx of panic attack  ? Arthritis   ? hands & knees  ? CKD (chronic kidney disease), stage III ([place])   ?  followed by pcp  ? Closed fracture of left distal radius 10/31/2021  ? Edema of both lower extremities   ? GERD (gastroesophageal reflux disease)   ? History of 2019 novel coronavirus disease (COVID-19) 09/17/2020  ? positive result in epic,  per pt very mild symtpoms that resolved  ? History of uterine fibroid   ? Hypertension   ? followed by pcp  ? Hypomagnesemia   ? Hypothyroidism   ? followed by pcp  ? IDA (iron deficiency anemia)   ? Osteopenia   ? Ovarian cyst   ? Pelvic pain   ? TMJ (temporomandibular joint disorder)   ? per pt right side, takes meloxicam  ? Wears contact lenses   ?  ?Family History  ?Problem Relation Age of Onset  ? Colon polyps Brother   ? Breast cancer Maternal Grandmother   ? Diabetes Brother   ? Hypertension Brother   ? Colon cancer Neg Hx   ? Esophageal cancer Neg Hx   ? Rectal cancer Neg Hx   ? Stomach cancer Neg Hx   ?  ?Past Surgical History:  ?Procedure Laterality Date  ? ABDOMINAL HYSTERECTOMY  1982  ? CHOLECYSTECTOMY  06/20/2012  ? Procedure: LAPAROSCOPIC CHOLECYSTECTOMY;  Surgeon: Zenovia Jarred, MD;  Location: [place];  Service: General;  Laterality: N/A;  ? COLONOSCOPY  last one 03-20-2019  dr Ardis Hughs  ? DIAGNOSTIC LAPAROSCOPY  yrs ago  ? HIP ARTHROPLASTY Right 11/13/2020  ? Procedure: ARTHROPLASTY BIPOLAR HIP (HEMIARTHROPLASTY);  Surgeon: Carole Civil, MD;  Location: [facility];  Service: Orthopedics;  Laterality: Right;  ? INCONTINENCE SURGERY  09-11-2005   dr Jeffie Pollock  '@[email or web address]'$   ? Janyth Pupa  ? LAPAROSCOPIC SALPINGO OOPHERECTOMY Bilateral 01/16/2021  ? Procedure: DIAGNOSTIC LAPAROSCOPY;  Surgeon: Molli Posey, MD;  Location: [facility];  Service: Gynecology;  Laterality: Bilateral;  ? LAPAROSCOPY Bilateral 01/16/2021  ? Procedure: EXPLORATORY LAPAROTOMY BILATERAL SALPINGO OOPHORECTOMY;  Surgeon: Molli Posey, MD;  Location: [facility];  Service: Gynecology;  Laterality: Bilateral;  ? TUBAL LIGATION    ? ?Social History  ? ?Occupational History   ? Not on file  ?Tobacco Use  ? Smoking status: Never  ? Smokeless tobacco: Never  ?Vaping Use  ? Vaping Use: Never used  ?Substance and Sexual Activity  ? Alcohol use: No  ? Drug use: Never  ? Sexual activity: Not on file  ? ? ? ? ? ? ?

## 2022-01-17 ENCOUNTER — Encounter: Payer: Self-pay | Admitting: Family Medicine

## 2022-01-19 ENCOUNTER — Ambulatory Visit (INDEPENDENT_AMBULATORY_CARE_PROVIDER_SITE_OTHER): Payer: 59 | Admitting: Residents

## 2022-01-19 ENCOUNTER — Encounter (INDEPENDENT_AMBULATORY_CARE_PROVIDER_SITE_OTHER): Payer: Self-pay | Admitting: Residents

## 2022-01-19 VITALS — BP 126/68 | HR 86 | Ht 62.0 in | Wt 162.0 lb

## 2022-01-19 DIAGNOSIS — E119 Type 2 diabetes mellitus without complications: Secondary | ICD-10-CM

## 2022-01-19 DIAGNOSIS — E1165 Type 2 diabetes mellitus with hyperglycemia: Secondary | ICD-10-CM | POA: Insufficient documentation

## 2022-01-19 DIAGNOSIS — I10 Essential (primary) hypertension: Secondary | ICD-10-CM

## 2022-01-19 DIAGNOSIS — E7800 Pure hypercholesterolemia, unspecified: Secondary | ICD-10-CM

## 2022-01-19 DIAGNOSIS — Z794 Long term (current) use of insulin: Secondary | ICD-10-CM

## 2022-01-19 NOTE — Progress Notes (Signed)
[facility] C[facility] OFF[facility] PROGRESS NOTE    [facility] [place] OFFICE -CARDIOLOGY  [address]  [place] [address]  Dept: [phone]  Dept Fax: [phone]       Patient Name: Helen [name]    Date of Visit:  Jan 19, 2022  Date of Birth: [birth year]  AGE: 76 y.o.  Medical Record #: [id number]  Requesting Physician: Myer Haff, FNP      CHIEF COMPLAINT: Annual Exam      HISTORY OF PRESENT ILLNESS:    76 year old female with past medical history of hypertension, hyperlipidemia, diabetes, previous bladder and breast cancer and right bundle branch block coming in for follow-up.  Patient is doing great at home.  Blood pressure is well controlled and most recent cholesterol shows LDL is at goal less than 70.  She has a slightly higher A1c goal with her endocrinologist and is on appropriate diabetic medication.  She denies chest pain, shortness of breath and palpitations at rest and with exertion.  Her biggest complaint is spinal stenosis and arthritis.      PAST MEDICAL HISTORY: She has a past medical history of Anemia, Arthritis, Arthritis, Asthma, Asthma without status asthmaticus, Atrioventricular conduction disorder, Bilateral cataracts, Breast cancer, Breast lump, Cancer, Carcinoma, Chicken pox, Conduction Disorder, Diabetes mellitus type II, Echocardiogram (10/2013), Gastroesophageal reflux disease, Hemorrhoids without complication, Holter monitor (02/2014), Hyperlipidemia, Hypertensive disorder, Malignant neoplasm of breast, Measles, Mumps, Myocardial perfusion scan (11/2013, 12/2013), RBBB, and Syncope. She has a past surgical history that includes Breast biopsy; Hysterectomy; Tumor removal (Left); ARTHROSCOPY, KNEE (Left, 04/21/2015); and Ablation of dysrhythmic focus.    ALLERGIES:   Allergies   Allergen Reactions    Aspirin      Break out in sweat and dizziness     Peppers     Pollen Extract     Latex Itching and Rash       MEDICATIONS:    Patient's current medications were reviewed. ONLY Cardiac medications were updated unless others were addressed in assessment and plan.    Current Outpatient Medications:     albuterol (PROVENTIL) (2.5 MG/3ML) 0.083% nebulizer solution, Take 3 mLs (2.5 mg total) by nebulization every 4 (four) hours as needed for Shortness of Breath or Wheezing, Disp: 75 mL, Rfl: 1    albuterol sulfate HFA (PROVENTIL) 108 (90 Base) MCG/ACT inhaler, Inhale 2 puffs into the lungs every 4 (four) hours as needed for Wheezing or Shortness of Breath, Disp: 1 each, Rfl: 2    amLODIPine (NORVASC) 10 MG tablet, Take 1 tablet (10 mg total) by mouth daily, Disp: 90 tablet, Rfl: 3    atorvastatin (LIPITOR) 40 MG tablet, Take 1 tablet (40 mg) by mouth daily, Disp: 90 tablet, Rfl: 1    Blood Glucose Monitoring Suppl (ONE TOUCH ULTRA 2) W/DEVICE KIT, One daily, Disp: 1 each, Rfl: 0    Cholecalciferol (Vitamin D3) 2000 UNIT capsule, Take by mouth daily, Disp: , Rfl:     desonide (DESOWEN) 0.05 % cream, , Disp: , Rfl:     dexAMETHasone (DECADRON) 0.1 % ophthalmic solution, , Disp: , Rfl:     diclofenac Sodium (VOLTAREN) 1 % Gel topical gel, Apply 2 g topically 4 (four) times daily, Disp: 50 g, Rfl: 0    diphenoxylate-atropine (LOMOTIL) 2.5-0.025 MG per tablet, , Disp: , Rfl:     ferrous sulfate 325 (65 FE) MG tablet, ferrous sulfate 325 mg (65 mg iron) tablet, Disp: , Rfl:  Flovent HFA 220 MCG/ACT inhaler, INHALE 1 PUFF INTO THE LUNGS TWICE A DAY, Disp: 12 each, Rfl: 2    fluocinonide (LIDEX) 0.05 % gel, , Disp: , Rfl:     fluticasone (FLONASE) 50 MCG/ACT nasal spray, , Disp: , Rfl:     glipiZIDE (GLUCOTROL) 10 MG tablet, Take 2 tablets (20 mg) by mouth 2 (two) times daily before meals, Disp: 360 tablet, Rfl: 0    glucose blood (OneTouch Ultra) test strip, 2 times daily, Disp: 200 each, Rfl: 3    Lancets (onetouch ultrasoft) lancets, Use to check blood sugars 2 times daily, Disp: 200 each, Rfl: 3    lidocaine (Lidoderm) 5 %, Place 1 patch onto  the skin daily as needed (pain) Remove & Discard patch within 12 hours, Disp: 30 patch, Rfl: 0    meloxicam (MOBIC) 7.5 MG tablet, TAKE 1 TABLET BY MOUTH EVERY DAY (Patient taking differently: as needed), Disp: 90 tablet, Rfl: 0    metFORMIN (GLUCOPHAGE) 1000 MG tablet, TAKE 1 TABLET BY MOUTH TWICE A DAY, Disp: 180 tablet, Rfl: 1    montelukast (SINGULAIR) 10 MG tablet, Take 1 tablet (10 mg) by mouth daily, Disp: , Rfl:     NASONEX 50 MCG/ACT nasal spray, as needed, Disp: , Rfl: 5    nitrofurantoin, macrocrystal-monohydrate, (MACROBID) 100 MG capsule, , Disp: , Rfl:     pantoprazole (PROTONIX) 40 MG tablet, TAKE 1 TABLET BY MOUTH EVERY DAY, Disp: 90 tablet, Rfl: 1    Tradjenta 5 MG Tab, TAKE 1 TABLET (5 MG TOTAL) BY MOUTH DAILY., Disp: 90 tablet, Rfl: 0    valsartan-hydroCHLOROthiazide (DIOVAN-HCT) 160-12.5 MG per tablet, Take 1 tablet by mouth daily, Disp: 90 tablet, Rfl: 3     FAMILY HISTORY: family history includes Diabetes in her mother, sister, and sister.    SOCIAL HISTORY: She reports that she has never smoked. She has never used smokeless tobacco. She reports that she does not drink alcohol and does not use drugs.    PHYSICAL EXAMINATION    Visit Vitals  BP 126/68 (BP Site: Left arm, Patient Position: Sitting, Cuff Size: Medium)   Pulse 86   Ht 1.575 m (5\' 2" )   Wt 73.5 kg (162 lb)   BMI 29.63 kg/m       Constitutional: Cooperative, alert, no acute distress.  Neck: No carotid bruits, JVP normal.  Cardiac: Regular rate and rhythm, normal S1 and S2; no S3 or S4. No murmurs. No rubs, no gallops.  Pulmonary: Clear to auscultation bilaterally, no wheezing, no rhonchi, no rales.  Extremities: no edema.  Vascular: +2 pulses in radial artery bilaterally, 2+ pedal pulses bilaterally.    ECG: Sinus rhythm with bifascicular block      LABS REVIEWED:   Lab Results   Component Value Date    WBC 5.8 12/25/2021    HGB 11.0 (L) 12/25/2021    HCT 36.0 12/25/2021    PLT 243 12/25/2021     Lab Results   Component Value Date     GLU 149 (H) 12/25/2021    BUN 16 12/25/2021    CREAT 0.78 12/25/2021    NA 136 12/25/2021    K 4.1 12/25/2021    CL 98 12/25/2021    CO2 24 12/25/2021    AST 21 12/25/2021    ALT 22 12/25/2021     Lab Results   Component Value Date    TSH 1.490 12/25/2021    HGBA1C 8.3 (H) 12/25/2021     Lab  Results   Component Value Date    CHOL 140 12/25/2021    TRIG 65 12/25/2021    HDL 72 12/25/2021    LDL 55 12/25/2021            Most recent echo and nuclear study reviewed.      IMPRESSION:   Ms. Ken is a 77 y.o. 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  Patient has no new symptoms.  Would continue her blood pressure and cholesterol medication as they are at goal.  A1c is mildly elevated, is following with endocrine on appropriate diabetic medications.  Can consider adding Jardiance in the future for coronary benefit if additional lowering is required.  Review of her cholesterol shows an LDL less than 70 which is at goal.  She will continue good diet and exercise.  We will consider repeat echocardiogram next year.                                                     Orders Placed This Encounter   Procedures    ECG 12 lead (Normal)    Office Visit ([facility] [place])       No orders of the defined types were placed in this encounter.      SIGNED:    Ruben Reason Auden Tatar, DO          This note was generated by the Dragon speech recognition and may contain errors or omissions not intended by the user. Grammatical errors, random word insertions, deletions, pronoun errors, and incomplete sentences are occasional consequences of this technology due to software limitations.  Not all errors are caught or corrected. If there are questions or concerns about the content of this note or information contained within the body of this dictation, they should be addressed directly with the author for clarification.

## 2022-01-21 ENCOUNTER — Encounter (INDEPENDENT_AMBULATORY_CARE_PROVIDER_SITE_OTHER): Payer: Self-pay | Admitting: Family

## 2022-01-21 DIAGNOSIS — E1165 Type 2 diabetes mellitus with hyperglycemia: Secondary | ICD-10-CM

## 2022-01-25 ENCOUNTER — Other Ambulatory Visit: Payer: Self-pay | Admitting: Family Medicine

## 2022-01-26 NOTE — Telephone Encounter (Signed)
Requested Prescriptions  Pending Prescriptions Disp Refills  . hydrochlorothiazide (HYDRODIURIL) 25 MG tablet [Pharmacy Med Name: HYDROCHLOROTHIAZIDE 25 MG Tablet] 90 tablet     Sig: TAKE 1 TABLET EVERY DAY     Cardiovascular: Diuretics - Thiazide Failed - 01/25/2022  3:05 AM      Failed - Cr in normal range and within 180 days    Creat  Date Value Ref Range Status  05/16/2021 1.09 (H) 0.60 - 1.00 mg/dL Final         Failed - K in normal range and within 180 days    Potassium  Date Value Ref Range Status  05/16/2021 4.3 3.5 - 5.3 mmol/L Final         Failed - Na in normal range and within 180 days    Sodium  Date Value Ref Range Status  05/16/2021 140 135 - 146 mmol/L Final         Failed - Last BP in normal range    BP Readings from Last 1 Encounters:  10/27/21 (!) 152/82         Passed - Valid encounter within last 6 months    Recent Outpatient Visits          3 months ago [place] Susy Frizzle, MD   8 months ago [place] Dennard Schaumann, Cammie Mcgee, MD   10 months ago Benign essential HTN   [place] Dennard Schaumann, Cammie Mcgee, MD   1 year ago Closed fracture of right hip with routine healing, subsequent encounter   [place] Pickard, Cammie Mcgee, MD   1 year ago Complex cyst of left ovary   [place] Dennard Schaumann, Cammie Mcgee, MD             . levothyroxine (SYNTHROID) 50 MCG tablet [Pharmacy Med Name: LEVOTHYROXINE SODIUM 50 MCG Tablet] 90 tablet 0    Sig: TAKE 1 TABLET EVERY DAY     Endocrinology:  Hypothyroid Agents Passed - 01/25/2022  3:05 AM      Passed - TSH in normal range and within 360 days    TSH  Date Value Ref Range Status  03/31/2021 1.89 0.40 - 4.50 mIU/L Final         Passed - Valid encounter within last 12 months    Recent Outpatient Visits          3 months ago [place] Dennard Schaumann, Cammie Mcgee, MD   8 months ago  [place] Dennard Schaumann, Cammie Mcgee, MD   10 months ago Benign essential HTN   [place], Cammie Mcgee, MD   1 year ago Closed fracture of right hip with routine healing, subsequent encounter   [place] Dennard Schaumann Cammie Mcgee, MD   1 year ago Complex cyst of left ovary   [place] Pickard, Cammie Mcgee, MD             Signed Prescriptions Disp Refills   pantoprazole (PROTONIX) 40 MG tablet 180 tablet 0    Sig: TAKE 1 TABLET TWICE DAILY     Gastroenterology: Proton Pump Inhibitors Passed - 01/25/2022  3:05 AM      Passed - Valid encounter within last 12 months    Recent Outpatient Visits          3 months ago [place]   [facility] Susy Frizzle, MD   8 months ago [place] Dennard Schaumann Cammie Mcgee, MD   10 months ago Benign essential HTN   [place] Dennard Schaumann, Cammie Mcgee, MD   1 year ago Closed fracture of right hip with routine healing, subsequent encounter   [place] Dennard Schaumann Cammie Mcgee, MD   1 year ago Complex cyst of left ovary   [place] Pickard, Cammie Mcgee, MD

## 2022-01-26 NOTE — Telephone Encounter (Signed)
Requested medication (s) are due for refill today: yes  Requested medication (s) are on the active medication list: yes  Last refill:  06/02/21 #90 1 RF  Future visit scheduled: no  Notes to clinic:  overdue lab work   Requested Prescriptions  Pending Prescriptions Disp Refills   hydrochlorothiazide (HYDRODIURIL) 25 MG tablet [Pharmacy Med Name: HYDROCHLOROTHIAZIDE 25 MG Tablet] 90 tablet     Sig: TAKE 1 TABLET EVERY DAY     Cardiovascular: Diuretics - Thiazide Failed - 01/25/2022  3:05 AM      Failed - Cr in normal range and within 180 days    Creat  Date Value Ref Range Status  05/16/2021 1.09 (H) 0.60 - 1.00 mg/dL Final         Failed - K in normal range and within 180 days    Potassium  Date Value Ref Range Status  05/16/2021 4.3 3.5 - 5.3 mmol/L Final         Failed - Na in normal range and within 180 days    Sodium  Date Value Ref Range Status  05/16/2021 140 135 - 146 mmol/L Final         Failed - Last BP in normal range    BP Readings from Last 1 Encounters:  10/27/21 (!) 152/82         Passed - Valid encounter within last 6 months    Recent Outpatient Visits           3 months ago [place] Dennard Schaumann, Cammie Mcgee, MD   8 months ago [place] Susy Frizzle, MD   10 months ago Benign essential HTN   [place] Dennard Schaumann, Cammie Mcgee, MD   1 year ago Closed fracture of right hip with routine healing, subsequent encounter   [place] Pickard, Cammie Mcgee, MD   1 year ago Complex cyst of left ovary   [place] Dennard Schaumann, Cammie Mcgee, MD               Signed Prescriptions Disp Refills   pantoprazole (PROTONIX) 40 MG tablet 180 tablet 0    Sig: TAKE 1 TABLET TWICE DAILY     Gastroenterology: Proton Pump Inhibitors Passed - 01/25/2022  3:05 AM      Passed - Valid encounter within last 12 months    Recent Outpatient Visits           3 months ago  [place] Dennard Schaumann, Cammie Mcgee, MD   8 months ago [place] Dennard Schaumann, Cammie Mcgee, MD   10 months ago Benign essential HTN   [place], Cammie Mcgee, MD   1 year ago Closed fracture of right hip with routine healing, subsequent encounter   [place] Pickard, Cammie Mcgee, MD   1 year ago Complex cyst of left ovary   [place] Dennard Schaumann, Cammie Mcgee, MD                levothyroxine (SYNTHROID) 50 MCG tablet 90 tablet 0    Sig: TAKE 1 TABLET EVERY DAY     Endocrinology:  Hypothyroid Agents Passed - 01/25/2022  3:05 AM      Passed - TSH in normal range and within 360 days    TSH  Date Value Ref Range Status  03/31/2021 1.89 0.40 - 4.50  mIU/L Final         Passed - Valid encounter within last 12 months    Recent Outpatient Visits           3 months ago [place]   [place] Dennard Schaumann, Cammie Mcgee, MD   8 months ago [place] Dennard Schaumann, Cammie Mcgee, MD   10 months ago Benign essential HTN   [place] Dennard Schaumann, Cammie Mcgee, MD   1 year ago Closed fracture of right hip with routine healing, subsequent encounter   [place] Susy Frizzle, MD   1 year ago Complex cyst of left ovary   [place] Pickard, Cammie Mcgee, MD

## 2022-01-26 NOTE — Telephone Encounter (Signed)
Requested Prescriptions  Pending Prescriptions Disp Refills  . hydrochlorothiazide (HYDRODIURIL) 25 MG tablet [Pharmacy Med Name: HYDROCHLOROTHIAZIDE 25 MG Tablet] 90 tablet     Sig: TAKE 1 TABLET EVERY DAY     Cardiovascular: Diuretics - Thiazide Failed - 01/25/2022  3:05 AM      Failed - Cr in normal range and within 180 days    Creat  Date Value Ref Range Status  05/16/2021 1.09 (H) 0.60 - 1.00 mg/dL Final         Failed - K in normal range and within 180 days    Potassium  Date Value Ref Range Status  05/16/2021 4.3 3.5 - 5.3 mmol/L Final         Failed - Na in normal range and within 180 days    Sodium  Date Value Ref Range Status  05/16/2021 140 135 - 146 mmol/L Final         Failed - Last BP in normal range    BP Readings from Last 1 Encounters:  10/27/21 (!) 152/82         Passed - Valid encounter within last 6 months    Recent Outpatient Visits          3 months ago [place] Susy Frizzle, MD   8 months ago [place] Dennard Schaumann, Cammie Mcgee, MD   10 months ago Benign essential HTN   [place] Dennard Schaumann, Cammie Mcgee, MD   1 year ago Closed fracture of right hip with routine healing, subsequent encounter   [place] Pickard, Cammie Mcgee, MD   1 year ago Complex cyst of left ovary   [place] Dennard Schaumann, Cammie Mcgee, MD             . pantoprazole (PROTONIX) 40 MG tablet [Pharmacy Med Name: PANTOPRAZOLE SODIUM 40 MG Tablet Delayed Release] 180 tablet 0    Sig: TAKE 1 TABLET TWICE DAILY     Gastroenterology: Proton Pump Inhibitors Passed - 01/25/2022  3:05 AM      Passed - Valid encounter within last 12 months    Recent Outpatient Visits          3 months ago [place]   [place] Dennard Schaumann, Cammie Mcgee, MD   8 months ago [place] Dennard Schaumann, Cammie Mcgee, MD   10 months ago Benign essential HTN   [place], Cammie Mcgee, MD   1 year ago Closed fracture of right hip with routine healing, subsequent encounter   [place] Pickard, Cammie Mcgee, MD   1 year ago Complex cyst of left ovary   [place] Dennard Schaumann, Cammie Mcgee, MD             . levothyroxine (SYNTHROID) 50 MCG tablet [Pharmacy Med Name: LEVOTHYROXINE SODIUM 50 MCG Tablet] 90 tablet 0    Sig: TAKE 1 TABLET EVERY DAY     Endocrinology:  Hypothyroid Agents Passed - 01/25/2022  3:05 AM      Passed - TSH in normal range and within 360 days    TSH  Date Value Ref Range Status  03/31/2021 1.89 0.40 - 4.50 mIU/L Final         Passed - Valid encounter within last 12 months    Recent Outpatient Visits          3 months ago  [place] Dennard Schaumann, Cammie Mcgee, MD   8 months ago [place] Dennard Schaumann Cammie Mcgee, MD   10 months ago Benign essential HTN   [place] Dennard Schaumann, Cammie Mcgee, MD   1 year ago Closed fracture of right hip with routine healing, subsequent encounter   [place] Dennard Schaumann Cammie Mcgee, MD   1 year ago Complex cyst of left ovary   [place] Pickard, Cammie Mcgee, MD

## 2022-02-02 ENCOUNTER — Other Ambulatory Visit (INDEPENDENT_AMBULATORY_CARE_PROVIDER_SITE_OTHER): Payer: Self-pay | Admitting: Family

## 2022-02-02 DIAGNOSIS — J4541 Moderate persistent asthma with (acute) exacerbation: Secondary | ICD-10-CM

## 2022-02-08 ENCOUNTER — Ambulatory Visit (INDEPENDENT_AMBULATORY_CARE_PROVIDER_SITE_OTHER): Payer: Medicare Other | Admitting: Orthopedic Surgery

## 2022-02-08 ENCOUNTER — Encounter: Payer: Self-pay | Admitting: Orthopedic Surgery

## 2022-02-08 VITALS — Ht 65.5 in | Wt 170.0 lb

## 2022-02-08 DIAGNOSIS — M1712 Unilateral primary osteoarthritis, left knee: Secondary | ICD-10-CM | POA: Diagnosis not present

## 2022-02-08 NOTE — Progress Notes (Signed)
Chief Complaint  Patient presents with   Routine Post Op    Rt hip DOS 11/13/20   Knee Pain    Lt knee pain, states you all discussed and it's still bothering her.    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  She has some difficulty when in certain positions  She is interested in what else she can do for the knee as she has had the Tylenol disown injection and she wears a soft brace  She does have a stage III kidney function with a GFR of about 59  Focused exam of the left knee reveals that the skin is normal there is tenderness on the medial and lateral joint lines without effusion the range of motion is limited from 0 to 120 degrees the knee is stable the muscle tone is normal  Also complains of some neck stiffness on rotation no neurologic symptoms no pain on flexion or extension  At this point we are going to proceed with facet injections mainly because it is probably not a good idea to start her on NSAIDs at this time  We will check into the HA injections and then get back to her regarding application.

## 2022-02-08 NOTE — Patient Instructions (Signed)
Looking into ha injections

## 2022-02-12 ENCOUNTER — Encounter (INDEPENDENT_AMBULATORY_CARE_PROVIDER_SITE_OTHER): Payer: Self-pay

## 2022-02-13 ENCOUNTER — Telehealth (INDEPENDENT_AMBULATORY_CARE_PROVIDER_SITE_OTHER): Payer: 59 | Admitting: Endocrinology, Diabetes and Metabolism

## 2022-02-13 ENCOUNTER — Encounter (INDEPENDENT_AMBULATORY_CARE_PROVIDER_SITE_OTHER): Payer: Self-pay | Admitting: Family

## 2022-02-13 ENCOUNTER — Encounter (INDEPENDENT_AMBULATORY_CARE_PROVIDER_SITE_OTHER): Payer: Self-pay | Admitting: Endocrinology, Diabetes and Metabolism

## 2022-02-13 VITALS — Ht 62.0 in | Wt 162.0 lb

## 2022-02-13 DIAGNOSIS — E1165 Type 2 diabetes mellitus with hyperglycemia: Secondary | ICD-10-CM

## 2022-02-13 MED ORDER — EMPAGLIFLOZIN 25 MG PO TABS
25.0000 mg | ORAL_TABLET | Freq: Every morning | ORAL | 3 refills | Status: DC
Start: 2022-02-13 — End: 2022-06-08

## 2022-02-13 NOTE — Patient Instructions (Addendum)
Please note that in the future if you need prescription refills, plan ahead and call our clinic during normal clinic hours only to ensure these get done in a timely fashion.  If you are overdue for your follow up appointment, we may ask that you come in for a visit before refilling the medications.  Thanks for your understanding.    Be sure you arrive to your appointments on time in the future.  If you are late by 10 minutes or more, we may ask you reschedule because it is discourteous to patients on our schedule who do come in on time to ask them to wait because you were late.    Recommended follow-up from today's visit: 3-4 months.  Please be sure to call the clinic 1 month before your next appointment so that we may send you any lab slips to get labs before your next visit.  Be sure to get your next labs 1-2 weeks before next visit.      Be sure to bring your glucose meter or blood sugar log next visit for review of sugars.    Diabetes General Instructions:  Take A+ care of yourself!  Failure to "get the results" can lead to the devastating complications of diabetes.  You can help to avoid this misery by following our advice, and by controlling all aspects of your disease to the best of your ability.  I look forward to helping you improve your control.  1.  Meal Plan:   a.  Control your calorie intake to avoid gaining weight.   b.  Please eat 3 regular meals, don't skip. Eat very healthy foods, including vegetables, fruits, whole grains, nuts (in limitation), and fish.  Use olive or canola oil as your oil source.  No fruit juices or regular sodas.   c.  Sodium restriction <1500 mg daily.  2.  Exercise:   Activity:  Consider an activity you can be comfortable with such as walking, jogging, running, swimming, or weights.  Try to walk at least 5 days a week for 30 minutes each day.  Start slowly and gradually build up your stamina.  You can do this.  Find activities that you enjoy!  Be persistent and enthusiastic.  Don't injure yourself and do not exercise to the point of exhaustion.  Be very careful.  Consult your MD.  3.  Daily careful shoe and foot exam.  If you cannot see your feet clearly and entirely please either use a mirror and/or ask a relative to do the exam for you (see below)  4.  Blood Sugar Monitoring:       a. Please check your sugars 1-2 times each day.  Record your glucose values in a log.  Please bring your glucose readings or meter to each and every visit with each of your diabetes care-providers.  If you are checking your sugars twice daily you can vary the times: before breakfast and dinner one day, and before lunch and 2 hours after dinner the next day.  It is important to check your sugars 2 hours after meals to see how high the glucose readings are at this time.  If you are on short acting and long acting insulins, you need to check sugars before each meal and bedtime at the very least.       b.  Ideal blood sugar levels 90 to 140 before and 90 to 180 2hr after meals.  5.  Yearly ophthalmology exam- be sure to have  your ophthalmologist forward [place] a copy of his exam report.  See your dentist every 6 months.  Get flu shot yearly.  6.  Labs: typically get comprehensive metabolic panel and fasting lipid panel once per year (if controlled); A1c tests are done 2-4 times per year (depending on control)  7.  Continued follow-up: every 3 months (or every 6 months if excellent control).  8.  Insulin can be injected anywhere where you can pinch up fat (typically belly, inner thighs or outside of arms).  9.  Most diabetes medications (other than metformin and Actos) can cause low blood sugars.  If you experience symptoms such as shakiness, weakness, confusion, lightheadedness, or sweats, this may indicate your blood sugar is low.  In this situation, please check your blood sugar.  If it is low (<70), have a small snack such as 4 oz of orange juice, 1 cup of milk, 4-5 pieces of hard candy or 3-4 glucose tablets.   Recheck sugar in 15 minutes.  If not improved, can repeat as needed.  10.  Blood pressure surveillance and cholesterol surveillance are also important parts of minimizing the risk for heart disease in the future.  Typically the goal blood pressure is < 130/80 and goal LDL (bad cholesterol) is <100 (for patient already with heart disease the LDL should be <70).  11.  Regarding medication for your diabetes: continue metformin 1000 mg twice daily with meals, lower Glipizide 10 mg twice daily with meals, continue Tradjenta 5 mg daily, start once daily Jardiance 25 mg (stay well hydrated).  12.  Diabetes foot care DO's and DON'TS:  DO's:  Wash your feet daily with warm water & soap.  Never soak your feet without first checking the temperature of the water by hand  Dry your feet well, especially between the toes  If the skin on your feet is dry, apply lotion to them everyday after bathing.  If your feet sweat a large amount, apply powder to them everyday  Check your feet daily for blisters, cuts, sores, redness or swelling.  Check carefully between your toes.  Use a mirror to check the bottom of your feet.  Notify your doctor right away if you find something wrong  Use an emery board gently to shape toenails even with the ends of the toes  File calluses or rough skin with an emery board to remove dead skin.  This should be done after washing the feet to help soften the skin.  Cut your toenails straight across (to prevent ingrown toenails)  Wear socks or stockings without seams or bumps that are clean and soft and well fitting  Keep your feet warm and dry  Wear shoes that fit well and do not rub toes or heels  Examine your shoes daily for cracks, pebbles, nails or anything that could hurt your feet  See your podiatrist (foot doctor) if you have a wound that is not healing    DON'Ts:  Never walk barefoot indoors or outdoors  Never use a hot water bottle or heating pad on your feet  Don't put lotion between your toes  Never  use a knife or razor blade to cut your toenails or feet  Don't use chemicals or corn and callous removers yourself  Never rip off a hangnail  Never wear garters, tight socks or other clothing which cuts off circulation to your feet  Don't sit with your legs crossed at the knees as this slows blood flow to your feet  Don't smoke!

## 2022-02-13 NOTE — Progress Notes (Signed)
wants referral fpr  nutritionist to lower her glucose.   Pt states she does not want to be on insulin.    Pt does not have glucose log.

## 2022-02-15 ENCOUNTER — Telehealth: Payer: Self-pay | Admitting: Endocrinology, Diabetes and Metabolism

## 2022-02-15 NOTE — Telephone Encounter (Signed)
Pt wanted to let Dr. Macon Large know, she gets [facility] by her allergy Dr. She started taking Jardiance and wanted to make sure there wouldn't be any complications. She would like to be notified once the Dr. Pascal Lux the massage.

## 2022-02-15 NOTE — Telephone Encounter (Signed)
I don't forsee a problem taking Jardiance based on this information.

## 2022-02-16 NOTE — Telephone Encounter (Signed)
Attempted to call was unable to leave voice mail

## 2022-02-19 ENCOUNTER — Other Ambulatory Visit (INDEPENDENT_AMBULATORY_CARE_PROVIDER_SITE_OTHER): Payer: Self-pay | Admitting: Endocrinology, Diabetes and Metabolism

## 2022-02-19 ENCOUNTER — Other Ambulatory Visit: Payer: Medicare Other

## 2022-02-20 LAB — LIPID PANEL
Cholesterol / HDL Ratio: 2.2 ratio (ref 0.0–4.4)
Cholesterol: 138 mg/dL (ref 100–199)
HDL: 64 mg/dL (ref >39)
LDL Chol Calculated (NIH): 62 mg/dL (ref 0–99)
Triglycerides: 58 mg/dL (ref 0–149)
VLDL Calculated: 12 mg/dL (ref 5–40)

## 2022-02-20 LAB — HEMOGLOBIN A1C: Hemoglobin A1C: 8.4 % — ABNORMAL HIGH (ref 4.8–5.6)

## 2022-02-20 NOTE — Addendum Note (Signed)
Addended byLavonna Rua on: 02/20/2022 08:29 AM     Modules accepted: Orders

## 2022-02-20 NOTE — Telephone Encounter (Signed)
Called patient back no answer left voicemail to return call

## 2022-02-23 ENCOUNTER — Telehealth (INDEPENDENT_AMBULATORY_CARE_PROVIDER_SITE_OTHER): Payer: 59 | Admitting: Registered Nurse

## 2022-02-23 DIAGNOSIS — E1165 Type 2 diabetes mellitus with hyperglycemia: Secondary | ICD-10-CM

## 2022-02-23 NOTE — Progress Notes (Addendum)
[facility]   EndocrinologyTelemedicine Follow-up  via HIPAA compliant secure video link   Date 02/23/22  (Start time1100-End Time11:35 )  Originating site (Patient location): Home   Distant site (Provider location): [facility]  Provider and Title: Madaline SavageGrace Chastidy Ranker RN ADCES   Consent obtained: Yes (verbal)   Language: English      Knowledge and Learning Needs Assessment:    Ratings:   1 = needs instruction  2 = needs review  3 = comprehends key points  4 = demonstrates competency  N/a = not assessed    Knowledge of diabetes disease process: 2  Nutrition Management: 2  Understanding of Physical Activity: 2  Medication Use: 1  Monitoring and Using Results: 2  Preventing Acute Complications: 2  Psychosocial Adjustment: 2  Behavior Change Strategies: 2  Risk Reduction Strategies: 2      Met with Helen [name] for initial assessment of Diabetes after being referred by Syliva Overmanhampaneri, Shivam A, MD for   Diabetes Self Management Education  and Medical Nutrition Therapy (MNT).   Pt is accompanied by : self.   Latest labs:  (April 23  A1C was 8.4%.  (Pt reports at one time had been 10%).    Medication/allergies/immunization has been reviewed: yes      How would you rate your overall health?     -fair      History with diabetes:  Diagnosed with Type 2 diabetes  approx 2019.  Previous diabetes education? no  Where if stating yes?    Medications for diabetes: Currently taking  metformin 1000 mg bid, glipizide 10 mg bid, tradjenta 5 mg qd, jardiance 5 mg qd.    How often do you miss taking medications as prescribed? Always.  General aspects of common diabetes medications reviewed including:  Treatment plan  Medications and where they work in the body    Monitoring:  Monitoring blood glucose was reviewed and included:   Patient already has a meter and is testing bid .  FBG: 151 mg/dl  Post Prandial BG: 161163 mg/dl     Advised to call  doctor for prescription for  more testing supplies. Patient verbalized understanding.     Blood glucose target ranges discussed with patient.   Hyperglycemia (350 or more) no  How often  Hypoglycemia yes  How often1-2 times per week, at around 3pm. Has started having snack at that time.    Meaningful testing was explained  Encouraged patient to continue checking bid.   Importance of keeping and recording blood glucose levels was discussed   Review A1C goal      Exercise:    What if any barriers do you have to physical activity? Spinal stenosis, and severe arthritis. Cold weather, wet weather and very hot weather exacerbates the pain.  Currently patient pushes through the pain with activity - housework, short walks.    Nutrition:   Special dietary/cultural or religious needs or restrictions? no If yes,   Patient reports since the diagnosis with diabetes, some changes to diet have included: Eats only 2 meals a day, reads labels - carb intake approx 30 grams per breakfast and dinner..   Do you read food labels? yes  The Healthy Plate Method was briefly explained.   24 hour food recall:   Goes to sleep late ( 2am) has trouble falling to sleep.  Wakes up at 11:30-12 noon.  Breakfast: @ 12 noon.1 egg, bacon, 1 toast.  3pm snack- sometimes vegetables/ or crackers.  Dinner; 6:30: small portion of brown rice, vegetables, meat (mimimizes red meat), or lots of sea food.  HS: snack - crackers.        Risk reduction:  Blood pressure check within the last 12 months: yes   Dilated eye exam within the last 12 months: yes  Diabetic foot exam within the last 12 months: yes   Dental visit within the last 6 months: no, last year.   Immunizations:  Flu? yes  Pneumonia no- "too expensive "  Shingles? No- "too expensive "  Covid? yes    Counseling was provided to the patient on the benefits of yearly-dilated eye exams and diabetic foot exams and regular dental exams  in the prevention of diabetes-related complications.                   Living with Diabetes  Is there  anything about diabetes that causes you stress or distress? no       Discussion:  Patient created a behavior change goal today of: advised to have 15-30 grams of carb food as snack at 3pm. (Had been having only vegetables) .   Potential barriers to learning or success: None identified  Patient learns best: all of the following  Written materials, verbal discussion, video, visual, other  The Educator has assessed their overall outcome of learning so far to be :Partially Achieved  LWWD class packet provided  via email to patient. Class 1, 2 and 3 curriculum discussed with patient.      Patient was encouraged to email/call this provider with any concerns or questions.     Plan: Patient is scheduled for the following visits:  LWWD Classes 1, 2 - needs class 3 and MNT still

## 2022-03-07 ENCOUNTER — Telehealth (INDEPENDENT_AMBULATORY_CARE_PROVIDER_SITE_OTHER): Payer: Self-pay | Admitting: Endocrinology, Diabetes and Metabolism

## 2022-03-07 NOTE — Telephone Encounter (Signed)
Name, strength, directions of requested refill(s):  amLODIPine (NORVASC) 10 MG tablet    How much medication is remaining: 6    Pharmacy to send refill to or patient to pick up rx from office (mark requested pharmacy in [place]):      [id number] [facility], [place] - [address]  [address]  [place] [address]  Phone: [phone] Fax: [phone]    [facility] - [place], [place] - [address] AT Portal to Reg[facility]  [address] [place] [address]  Phone: [phone] Fax: [phone]        Please mark "X" next to the preferred call back number:    Mobile: [phone] (mobile) X   Home: [phone] (home)    Work: @WORKPHONE @        Medication refill request, see above. Thank you   Patient has been informed that medication refill requests should be called in up to one week prior to running out of medication.    Additional Notes:  Next Visit: MM/DD/YY

## 2022-03-07 NOTE — Telephone Encounter (Signed)
SPoke to Mrs Doetsch to let her know to contact her PCP she said "no Meaghan FNP it would not make since for her to fill it since I'm gong to him be he can fill this."    I let her know I will let Dr Macon Large know this but it still may need to go trough the PCP she said ok let me know and I can call them

## 2022-03-08 ENCOUNTER — Other Ambulatory Visit (INDEPENDENT_AMBULATORY_CARE_PROVIDER_SITE_OTHER): Payer: Self-pay | Admitting: Family

## 2022-03-08 DIAGNOSIS — I10 Essential (primary) hypertension: Secondary | ICD-10-CM

## 2022-03-08 NOTE — Telephone Encounter (Signed)
Name, strength, directions of requested refill(s):    amLODIPine (NORVASC) 10 MG tablet      How much medication is remaining: 3    Pharmacy to send refill to or patient to pick up rx from office (mark requested pharmacy in BOLD):      [id number], [place] - [address]  [address]  [place] [address]  Phone: [phone] Fax: [phone]    [facility] [place], [place] - [address] [place] [address] AT [facility] to [facility]  [address] [address]  Phone: [phone] Fax: [phone]        Please mark "X" next to the preferred call back number:    Mobile: [phone] (mobile) X   Home: [phone] (home)    Work: @WORKPHONE @        Medication refill request, see above. Thank you   Patient has been informed that medication refill requests should be called in up to one week prior to running out of medication.    Additional Notes:  Next Visit: MM/DD/YY  -05/09/22

## 2022-03-09 ENCOUNTER — Telehealth (INDEPENDENT_AMBULATORY_CARE_PROVIDER_SITE_OTHER): Payer: Medicare Other

## 2022-03-09 MED ORDER — AMLODIPINE BESYLATE 10 MG PO TABS
10.0000 mg | ORAL_TABLET | Freq: Every day | ORAL | 0 refills | Status: DC
Start: 2022-03-09 — End: 2022-06-05

## 2022-03-09 NOTE — Progress Notes (Deleted)
[facility]  Telemedicine Visit  via HIPAA compliant secure video link   Date 03/09/2022 (Start time  0930 -  End Time ***)    Originating site (Patient location): Home   Distant site (Provider location): [facility]  Consent obtained: Yes (verbal)   Language: English     Angelene Giovanni, referred by Syliva Overman, MD, attended the first class of the Living Well with Type 2 Diabetes Series today.    The individual(s) attending were :{ Persons; family members:60370}    The Educator has assessed their overall outcome of learning so far today to be:  {PSYCH LEARNING INTERVENTION:23678}    The Educator has noticed there may be some Barriers to learning related to the following: {Barriers to Learning:26975}    Understanding the Basics of Type 2 Diabetes education included:   What is diabetes?  Type 2 versus Type 1 diabetes  How the body manages blood glucose  Insulin Resistance  Natural progression of Type 2 DM  Basic Treatment options for a person with the diagnosis of diabetes    Physical activity was encouraged and topics related to discussion included:  Types of exercise  Staying safe when exercising    Nutrition and food topics covered in class today included:  Food group categories and their effect on blood glucose  Carbohydrate counting and practice  Recommendations per meal  Reading a food label  Meal planning and snacks  Internet and smart phone apps & resources  Healthy plate method    Behavior Change Goal: advised to have 15-30 grams of carb food as snack at 3pm. (Had been having only vegetables) . ; self-rated today as: ***%    Comments:  ***     Patient was encouraged to email/call this provider with any concerns or questions.     Plan: Patient is scheduled for the following visits:  LWWD Classes  2. Pt needs scheduled for class 3 and  MNT follow up with an RD

## 2022-03-12 ENCOUNTER — Ambulatory Visit (INDEPENDENT_AMBULATORY_CARE_PROVIDER_SITE_OTHER): Payer: Medicare Other | Admitting: Orthopedic Surgery

## 2022-03-12 DIAGNOSIS — M1712 Unilateral primary osteoarthritis, left knee: Secondary | ICD-10-CM | POA: Diagnosis not present

## 2022-03-12 NOTE — Progress Notes (Signed)
Chief Complaint  Patient presents with   Knee Pain    L/here to get my first injection.

## 2022-03-12 NOTE — Addendum Note (Signed)
Addended by: Arther Abbott E on: 03/12/2022 12:13 PM   Modules accepted: Level of Service

## 2022-03-12 NOTE — Progress Notes (Signed)
Chief Complaint  Patient presents with   Knee Pain    L/here to get my first injection.   Encounter Diagnosis  Name Primary?   Unilateral primary osteoarthritis, left knee Yes     Injection left knee #1  KNEE left  The patient has osteoarthritis of the knee. She has been on anti-inflammatories and had cortisone injection and has failed that conservative treatment.  The patient has been approved and wishes to try hyaluronic acid injection in the form Synvisc The left knee is prepped with alcohol and ethyl chloride  The injection is performed with a 21-gauge needle, via inferolateral approach  No complications were noted  Appropriate instructions post injection were given Return 1 week second injection left knee

## 2022-03-15 ENCOUNTER — Ambulatory Visit (INDEPENDENT_AMBULATORY_CARE_PROVIDER_SITE_OTHER): Payer: Medicare Other | Admitting: Family Medicine

## 2022-03-15 ENCOUNTER — Ambulatory Visit: Payer: Medicare Other | Admitting: Family Medicine

## 2022-03-15 VITALS — BP 152/86 | HR 74 | Temp 98.4°F | Ht 65.0 in | Wt 170.0 lb

## 2022-03-15 DIAGNOSIS — E038 Other specified hypothyroidism: Secondary | ICD-10-CM

## 2022-03-15 DIAGNOSIS — E7800 Pure hypercholesterolemia, unspecified: Secondary | ICD-10-CM | POA: Diagnosis not present

## 2022-03-15 DIAGNOSIS — I10 Essential (primary) hypertension: Secondary | ICD-10-CM | POA: Diagnosis not present

## 2022-03-15 DIAGNOSIS — N1831 Chronic kidney disease, stage 3a: Secondary | ICD-10-CM | POA: Diagnosis not present

## 2022-03-15 MED ORDER — LOSARTAN POTASSIUM 50 MG PO TABS: 50.0000 mg | ORAL_TABLET | Freq: Every day | ORAL | 3 refills | Status: DC

## 2022-03-15 NOTE — Progress Notes (Signed)
Subjective:    Patient ID: April Beck, female    DOB: [birth year], 76 y.o.   MRN: [id number]  Patient is here today for complete physical exam.  Patient has a history of stage III chronic kidney disease.  We are due to check fasting lab work today.  Her mammogram and bone density test are performed at her gynecologist.  These are up-to-date.  Her last colonoscopy was performed a few years ago.  She does not require any further colonoscopy due to her age.  She does not require Pap smear.  Her blood pressure is elevated today 152/86.  She stopped taking hydrochlorothiazide.  She is also been taking meloxicam.  She has a history of stage III chronic kidney disease.  I explained to her that she should not be taking NSAIDs with chronic kidney disease and that we need to manage her blood pressure more aggressively.  She was primarily using hydrochlorothiazide for leg swelling Immunization History  Administered Date(s) Administered   Fluad Quad(high Dose 65+) 05/21/2019, 06/19/2021   Influenza Split 06/20/2012, 06/29/2020   Influenza, High Dose Seasonal PF 06/20/2017, 07/11/2018, 06/29/2020   Influenza,inj,Quad PF,6+ Mos 06/01/2013, 05/13/2014, 06/27/2015   PFIZER(Purple Top)SARS-COV-2 Vaccination 11/30/2019, 12/23/2019, 06/07/2020   Pneumococcal Conjugate-13 10/23/2013   Pneumococcal Polysaccharide-23 06/20/2012   Tdap 06/20/2017   Zoster Recombinat (Shingrix) 08/29/2017, 12/13/2017   Zoster, Live 08/04/2009     Past Medical History:  Diagnosis Date   Anxiety    hx of panic attack   Arthritis    hands & knees   CKD (chronic kidney disease), stage III ([place])    followed by pcp   Closed fracture of left distal radius 10/31/2021   Edema of both lower extremities    GERD (gastroesophageal reflux disease)    History of 2019 novel coronavirus disease (COVID-19) 09/17/2020   positive result in epic,  per pt very mild symtpoms that resolved   History of uterine fibroid    Hypertension     followed by pcp   Hypomagnesemia    Hypothyroidism    followed by pcp   IDA (iron deficiency anemia)    Osteopenia    Ovarian cyst    Pelvic pain    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, takes meloxicam   Wears contact lenses    Past Surgical History:  Procedure Laterality Date   ABDOMINAL HYSTERECTOMY  1982   CHOLECYSTECTOMY  06/20/2012   Procedure: LAPAROSCOPIC CHOLECYSTECTOMY;  Surgeon: Zenovia Jarred, MD;  Location: [facility];  Service: General;  Laterality: N/A;   COLONOSCOPY  last one 03-20-2019  dr Ardis Hughs   DIAGNOSTIC LAPAROSCOPY  yrs ago   HIP ARTHROPLASTY Right 11/13/2020   Procedure: ARTHROPLASTY BIPOLAR HIP (HEMIARTHROPLASTY);  Surgeon: Carole Civil, MD;  Location: [facility];  Service: Orthopedics;  Laterality: Right;   INCONTINENCE SURGERY  09-11-2005   dr Jeffie Pollock  '@[facility]'$    LYNX SLING   LAPAROSCOPIC SALPINGO OOPHERECTOMY Bilateral 01/16/2021   Procedure: DIAGNOSTIC LAPAROSCOPY;  Surgeon: Molli Posey, MD;  Location: [facility];  Service: Gynecology;  Laterality: Bilateral;   LAPAROSCOPY Bilateral 01/16/2021   Procedure: EXPLORATORY LAPAROTOMY BILATERAL SALPINGO OOPHORECTOMY;  Surgeon: Molli Posey, MD;  Location: [facility];  Service: Gynecology;  Laterality: Bilateral;   TUBAL LIGATION     Current Outpatient Medications on File Prior to Visit  Medication Sig Dispense Refill   acetaminophen (TYLENOL) 325 MG tablet Take 2 tablets (650 mg total) by mouth every 6 (  six) hours as needed for pain.     acyclovir (ZOVIRAX) 400 MG tablet TAKE 1 TABLET EVERY DAY 90 tablet 3   atorvastatin (LIPITOR) 40 MG tablet TAKE 1 TABLET EVERY DAY 90 tablet 3   calcium-vitamin D (OSCAL WITH D) 250-125 MG-UNIT tablet Take 1 tablet by mouth daily.     citalopram (CELEXA) 10 MG tablet TAKE 1 TABLET EVERY DAY 90 tablet 3   diphenoxylate-atropine (LOMOTIL) 2.5-0.025 MG tablet TAKE 1 TABLET BY MOUTH 4 (FOUR) TIMES DAILY AS NEEDED FOR  DIARRHEA OR LOOSE STOOLS. 30 tablet 0   fluocinonide gel (LIDEX) 4.09 % Apply 1 application topically 3 (three) times daily as needed (rash).     hydrochlorothiazide (HYDRODIURIL) 25 MG tablet TAKE 1 TABLET EVERY DAY 90 tablet 3   levothyroxine (SYNTHROID) 50 MCG tablet TAKE 1 TABLET EVERY DAY 90 tablet 0   meloxicam (MOBIC) 15 MG tablet TAKE 1 TABLET EVERY DAY 90 tablet 3   pantoprazole (PROTONIX) 40 MG tablet TAKE 1 TABLET TWICE DAILY 180 tablet 0   potassium chloride SA (KLOR-CON) 20 MEQ tablet TAKE 1 TABLET EVERY DAY 90 tablet 3   No current facility-administered medications on file prior to visit.   No Known Allergies Social History   Socioeconomic History   Marital status: Divorced    Spouse name: Not on file   Number of children: Not on file   Years of education: Not on file   Highest education level: Not on file  Occupational History   Not on file  Tobacco Use   Smoking status: Never   Smokeless tobacco: Never  Vaping Use   Vaping Use: Never used  Substance and Sexual Activity   Alcohol use: No   Drug use: Never   Sexual activity: Not on file  Other Topics Concern   Not on file  Social History Narrative   Not on file   Social Determinants of Health   Financial Resource Strain: Low Risk  (02/23/2021)   Overall Financial Resource Strain (CARDIA)    Difficulty of Paying Living Expenses: Not hard at all  Food Insecurity: No Food Insecurity (01/15/2022)   Hunger Vital Sign    Worried About Running Out of Food in the Last Year: Never true    [place] in the Last Year: Never true  Transportation Needs: No Transportation Needs (01/15/2022)   PRAPARE - Hydrologist (Medical): No    Lack of Transportation (Non-Medical): No  Physical Activity: Insufficiently Active (02/23/2021)   Exercise Vital Sign    Days of Exercise per Week: 7 days    Minutes of Exercise per Session: 20 min  Stress: No Stress Concern Present (02/23/2021)   [place]    Feeling of Stress : Not at all  Social Connections: Moderately Integrated (02/23/2021)   Social Connection and Isolation Panel [NHANES]    Frequency of Communication with Friends and Family: More than three times a week    Frequency of Social Gatherings with Friends and Family: More than three times a week    Attends Religious Services: More than 4 times per year    Active Member of [employer] or Organizations: Yes    Attends Archivist Meetings: More than 4 times per year    Marital Status: Widowed  Intimate Partner Violence: Not At Risk (02/23/2021)   Humiliation, Afraid, Rape, and Kick questionnaire    Fear of Current or Ex-Partner: No  Emotionally Abused: No    Physically Abused: No    Sexually Abused: No       Review of Systems  All other systems reviewed and are negative.      Objective:   Physical Exam Vitals reviewed.  Constitutional:      General: She is not in acute distress.    Appearance: She is well-developed. She is not diaphoretic.  HENT:     Head: Normocephalic and atraumatic.     Right Ear: External ear normal.     Left Ear: External ear normal.     Nose: Nose normal.     Mouth/Throat:     Pharynx: No oropharyngeal exudate.  Eyes:     General: No scleral icterus.       Right eye: No discharge.        Left eye: No discharge.     Conjunctiva/sclera: Conjunctivae normal.     Pupils: Pupils are equal, round, and reactive to light.  Neck:     Thyroid: No thyromegaly.     Vascular: No JVD.     Trachea: No tracheal deviation.  Cardiovascular:     Rate and Rhythm: Normal rate and regular rhythm.     Heart sounds: Normal heart sounds. No murmur heard.    No friction rub. No gallop.  Pulmonary:     Effort: Pulmonary effort is normal. No respiratory distress.     Breath sounds: Normal breath sounds. No stridor. No wheezing or rales.  Chest:     Chest wall: No tenderness.   Abdominal:     General: Bowel sounds are normal. There is no distension.     Palpations: Abdomen is soft. There is no mass.     Tenderness: There is no abdominal tenderness. There is no guarding or rebound.  Musculoskeletal:        General: No tenderness or deformity.     Cervical back: Normal range of motion and neck supple.  Lymphadenopathy:     Cervical: No cervical adenopathy.  Skin:    General: Skin is warm.     Coloration: Skin is not pale.     Findings: No erythema or rash.  Neurological:     Mental Status: She is alert and oriented to person, place, and time.     Cranial Nerves: No cranial nerve deficit.     Motor: No abnormal muscle tone.     Coordination: Coordination normal.     Deep Tendon Reflexes: Reflexes are normal and symmetric. Reflexes normal.  Psychiatric:        Behavior: Behavior normal.        Thought Content: Thought content normal.        Judgment: Judgment normal.           Assessment & Plan:  Benign essential HTN - Plan: CBC with Differential/Platelet, Lipid panel, COMPLETE METABOLIC PANEL WITH GFR, TSH  Stage 3a chronic kidney disease ([place]) - Plan: CBC with Differential/Platelet, Lipid panel, COMPLETE METABOLIC PANEL WITH GFR, TSH  Pure hypercholesterolemia - Plan: CBC with Differential/Platelet, Lipid panel, COMPLETE METABOLIC PANEL WITH GFR, TSH  Other specified hypothyroidism - Plan: CBC with Differential/Platelet, Lipid panel, COMPLETE METABOLIC PANEL WITH GFR, TSH Discontinue hydrochlorothiazide.  Discontinue potassium.  Replace with losartan 50 mg a day and recheck blood pressure in 1 month.  Check CBC CMP and lipid panel.  Goal LDL cholesterol is less than 100.  Monitor renal function.  Avoid all NSAIDs.  Discontinue meloxicam.  Check TSH to ensure adequate  dosage of levothyroxine.  Immunizations are up-to-date.  Cancer screening is up-to-date.  Mammogram and bone density test are performed by her gynecologist.

## 2022-03-16 ENCOUNTER — Encounter: Payer: Self-pay | Admitting: Family Medicine

## 2022-03-16 LAB — COMPLETE METABOLIC PANEL WITH GFR
AG Ratio: 1.9 (calc) (ref 1.0–2.5)
ALT: 15 U/L (ref 6–29)
AST: 20 U/L (ref 10–35)
Albumin: 4.3 g/dL (ref 3.6–5.1)
Alkaline phosphatase (APISO): 58 U/L (ref 37–153)
BUN/Creatinine Ratio: 15 (calc) (ref 6–22)
BUN: 21 mg/dL (ref 7–25)
CO2: 30 mmol/L (ref 20–32)
Calcium: 9.9 mg/dL (ref 8.6–10.4)
Chloride: 102 mmol/L (ref 98–110)
Creat: 1.41 mg/dL — ABNORMAL HIGH (ref 0.60–1.00)
Globulin: 2.3 g/dL (calc) (ref 1.9–3.7)
Glucose, Bld: 91 mg/dL (ref 65–99)
Potassium: 5 mmol/L (ref 3.5–5.3)
Sodium: 140 mmol/L (ref 135–146)
Total Bilirubin: 1 mg/dL (ref 0.2–1.2)
Total Protein: 6.6 g/dL (ref 6.1–8.1)
eGFR: 39 mL/min/{1.73_m2} — ABNORMAL LOW (ref >=60)

## 2022-03-16 LAB — CBC WITH DIFFERENTIAL/PLATELET
Absolute Monocytes: 478 cells/uL (ref 200–950)
Basophils Absolute: 31 cells/uL (ref 0–200)
Basophils Relative: 0.6 %
Eosinophils Absolute: 88 cells/uL (ref 15–500)
Eosinophils Relative: 1.7 %
HCT: 40 % (ref 35.0–45.0)
Hemoglobin: 13.4 g/dL (ref 11.7–15.5)
Lymphs Abs: 1612 cells/uL (ref 850–3900)
MCH: 31.7 pg (ref 27.0–33.0)
MCHC: 33.5 g/dL (ref 32.0–36.0)
MCV: 94.6 fL (ref 80.0–100.0)
MPV: 9.9 fL (ref 7.5–12.5)
Monocytes Relative: 9.2 %
Neutro Abs: 2990 cells/uL (ref 1500–7800)
Neutrophils Relative %: 57.5 %
Platelets: 195 10*3/uL (ref 140–400)
RBC: 4.23 10*6/uL (ref 3.80–5.10)
RDW: 11.6 % (ref 11.0–15.0)
Total Lymphocyte: 31 %
WBC: 5.2 10*3/uL (ref 3.8–10.8)

## 2022-03-16 LAB — LIPID PANEL
Cholesterol: 152 mg/dL (ref <200)
HDL: 57 mg/dL (ref >=50)
LDL Cholesterol (Calc): 75 mg/dL (calc)
Non-HDL Cholesterol (Calc): 95 mg/dL (calc) (ref <130)
Total CHOL/HDL Ratio: 2.7 (calc) (ref <5.0)
Triglycerides: 112 mg/dL (ref <150)

## 2022-03-16 LAB — TSH: TSH: 2.04 mIU/L (ref 0.40–4.50)

## 2022-03-19 ENCOUNTER — Ambulatory Visit (INDEPENDENT_AMBULATORY_CARE_PROVIDER_SITE_OTHER): Payer: Medicare Other | Admitting: Orthopedic Surgery

## 2022-03-19 ENCOUNTER — Encounter: Payer: Self-pay | Admitting: Orthopedic Surgery

## 2022-03-19 VITALS — BP 140/85 | HR 84

## 2022-03-19 DIAGNOSIS — M1712 Unilateral primary osteoarthritis, left knee: Secondary | ICD-10-CM

## 2022-03-19 MED ORDER — HYALURONAN 30 MG/2ML IX SOSY
15.0000 mg | PREFILLED_SYRINGE | Freq: Once | INTRA_ARTICULAR | Status: AC
  Administered 2022-03-19: 15 mg via INTRA_ARTICULAR

## 2022-03-19 NOTE — Progress Notes (Signed)
FOLLOW UP   Encounter Diagnosis  Name Primary?   Unilateral primary osteoarthritis, left knee Yes     Chief Complaint  Patient presents with   Injections    Left knee Orthovisc #2     Procedure note for injection of hyaluronic acid   Diagnosis osteoarthritis of the knee  Verbal consent was obtained to inject the knee with HYALURONIC ACID . Timeout was completed to confirm the injection site as the LEFT    Knee  Ethyl chloride spray was used for anesthesia Alcohol was used to prep the skin. The infrapatellar lateral portal was used as an injection site and 1 vial of hyaluronic acid  was injected into the knee  Specific Co. Preparation: ORTHOVISC  No complications were noted   RET 1 WEEKS FRO 3 RD INJ LEFT KNEE

## 2022-03-21 ENCOUNTER — Telehealth (INDEPENDENT_AMBULATORY_CARE_PROVIDER_SITE_OTHER): Payer: Medicare Other

## 2022-03-21 DIAGNOSIS — E1165 Type 2 diabetes mellitus with hyperglycemia: Secondary | ICD-10-CM

## 2022-03-21 NOTE — Progress Notes (Signed)
[facility]  Telemedicine Visit  via HIPAA compliant secure video link   Date 03/21/2022 (Start time  1300 -  End Time 1435)    Originating site (Patient location): Home   Distant site (Provider location): [facility]  Consent obtained: Yes (verbal)   Language: English     Helen [name], referred by Syliva Overman, MD, attended the first class of the Living Well with Type 2 Diabetes Series today.    The individual(s) attending were :patient    The Educator has assessed their overall outcome of learning so far today to be:  Fully Achieved    The Educator has noticed there may be some Barriers to learning related to the following: None identified    Understanding the Basics of Type 2 Diabetes education included:   What is diabetes?  Type 2 versus Type 1 diabetes  How the body manages blood glucose  Insulin Resistance  Natural progression of Type 2 DM  Basic Treatment options for a person with the diagnosis of diabetes    Physical activity was encouraged and topics related to discussion included:  Types of exercise  Staying safe when exercising    Nutrition and food topics covered in class today included:  Food group categories and their effect on blood glucose  Carbohydrate counting and practice  Recommendations per meal  Reading a food label  Meal planning and snacks  Internet and smart phone apps & resources  Healthy plate method    Behavior Change Goal: Activity; self-rated today as: 90%    Comments: None     Patient was encouraged to email/call this provider with any concerns or questions.     Plan: Patient is scheduled for the following visits:  LWWD Classes  2 and 3  MNT follow up with an RD

## 2022-03-23 ENCOUNTER — Encounter (INDEPENDENT_AMBULATORY_CARE_PROVIDER_SITE_OTHER): Payer: Self-pay | Admitting: Family

## 2022-03-23 ENCOUNTER — Telehealth (INDEPENDENT_AMBULATORY_CARE_PROVIDER_SITE_OTHER): Payer: Medicare Other | Admitting: Registered"

## 2022-03-26 ENCOUNTER — Ambulatory Visit (INDEPENDENT_AMBULATORY_CARE_PROVIDER_SITE_OTHER): Payer: Medicare Other | Admitting: Orthopedic Surgery

## 2022-03-26 VITALS — BP 132/85 | HR 83

## 2022-03-26 DIAGNOSIS — M1712 Unilateral primary osteoarthritis, left knee: Secondary | ICD-10-CM | POA: Diagnosis not present

## 2022-03-26 NOTE — Progress Notes (Signed)
Encounter Diagnosis  Name Primary?   Unilateral primary osteoarthritis, left knee Yes     Procedure note for injection of hyaluronic acid   Diagnosis osteoarthritis of the knee  Verbal consent was obtained to inject the knee with HYALURONIC ACID . Timeout was completed to confirm the injection site as the left   knee  Ethyl chloride spray was used for anesthesia Alcohol was used to prep the skin. The infrapatellar lateral portal was used as an injection site and 1 vial of hyaluronic acid  was injected into the knee  Specific Co. Preparation: Orthovisc  No complications were noted

## 2022-03-26 NOTE — Progress Notes (Signed)
Orthovisc lot # [id number] EXP 2024-06-02 [place] [id number]

## 2022-03-29 ENCOUNTER — Telehealth (INDEPENDENT_AMBULATORY_CARE_PROVIDER_SITE_OTHER): Payer: 59 | Admitting: Registered"

## 2022-03-29 DIAGNOSIS — E1165 Type 2 diabetes mellitus with hyperglycemia: Secondary | ICD-10-CM

## 2022-03-29 DIAGNOSIS — Z794 Long term (current) use of insulin: Secondary | ICD-10-CM

## 2022-03-29 NOTE — Progress Notes (Signed)
[facility]  Telemedicine Visit  via HIPAA compliant secure video link   Date 03/29/22 (Start time 927- End Time 1145)    Originating site (Patient location): Home   Distant site (Provider location): [facility]  Consent obtained: Yes (verbal)   Language: English     Helen [name], referred by Syliva Overman, MD, attended the second class of the Living Well with Type 2 Diabetes Series today.      The individual(s) attending were:  patient    The Educator assessed their overall outcome of learning upon completion of the classes to be: Partially Achieved    The Educator has noticed there may be some barriers to learning related to the following: None identified    Monitoring blood glucose was reviewed and included:  Factors that affect blood glucose control  Problem solving blood glucose patterns  Hyperglycemia  Sick day care  Hypoglycemia  Rule of 15  Nutrition and food topics covered in class today included:  Review carb counting  Dinning out and special occasions  Alcohol guidelines  Weight management  Physical activity was encouraged and topics related to discussion included:  Exercise recommendations  Getting more activity  Exercise for  Weight Loss  Emotional well-being and managing Diabetes long-term were discussed and included:  Depression, Stress, Burnout  Getting Support  Overcoming barriers to success  Life Balance    Behavior Change Goal: consume "snack" 15-30 grams carbs at 3pm; self-rated today as: 80%    Patient was encouraged to email/call this provider with any concerns or questions.     Plan: Patient is scheduled for the following visits:  LWWD Classes  3   MNT follow up with an RD

## 2022-04-02 ENCOUNTER — Other Ambulatory Visit (INDEPENDENT_AMBULATORY_CARE_PROVIDER_SITE_OTHER): Payer: Self-pay | Admitting: Family

## 2022-04-02 DIAGNOSIS — E119 Type 2 diabetes mellitus without complications: Secondary | ICD-10-CM

## 2022-04-03 ENCOUNTER — Other Ambulatory Visit (INDEPENDENT_AMBULATORY_CARE_PROVIDER_SITE_OTHER): Payer: Self-pay | Admitting: Family

## 2022-04-05 ENCOUNTER — Ambulatory Visit: Payer: Self-pay | Admitting: *Deleted

## 2022-04-05 ENCOUNTER — Encounter: Payer: Self-pay | Admitting: *Deleted

## 2022-04-05 DIAGNOSIS — N1831 Chronic kidney disease, stage 3a: Secondary | ICD-10-CM

## 2022-04-05 DIAGNOSIS — E7800 Pure hypercholesterolemia, unspecified: Secondary | ICD-10-CM

## 2022-04-05 NOTE — Chronic Care Management (AMB) (Signed)
Care Management    RN Visit Note  04/05/2022 Name: April Beck MRN: [id number] DOB: [birth year]  Subjective: April Beck is a 76 y.o. year old female who is a primary care April Beck of Beck, April Mcgee, MD. The care management team was consulted for assistance with disease management and care coordination needs.    Engaged with April Beck by telephone for follow up visit in response to provider referral for case management and/or care coordination services.   Consent to Services:   April Beck was given information about Care Management services today including:  Care Management services includes personalized support from designated clinical staff supervised by April Beck physician, including individualized plan of care and coordination with other care providers 24/7 contact phone numbers for assistance for urgent and routine care needs. The April Beck may stop case management services at any time by phone call to the office staff.  April Beck agreed to services and consent obtained.   Assessment: Review of April Beck past medical history, allergies, medications, health status, including review of consultants reports, laboratory and other test data, was performed as part of comprehensive evaluation and provision of chronic care management services.   SDOH (Social Determinants of Health) assessments and interventions performed:    Care Plan  No Known Allergies  Outpatient Encounter Medications as of 04/05/2022  Medication Sig   acetaminophen (TYLENOL) 325 MG tablet Take 2 tablets (650 mg total) by mouth every 6 (six) hours as needed for pain.   acyclovir (ZOVIRAX) 400 MG tablet TAKE 1 TABLET EVERY DAY   atorvastatin (LIPITOR) 40 MG tablet TAKE 1 TABLET EVERY DAY   calcium-vitamin D (OSCAL WITH D) 250-125 MG-UNIT tablet Take 1 tablet by mouth daily.   citalopram (CELEXA) 10 MG tablet TAKE 1 TABLET EVERY DAY   diphenoxylate-atropine (LOMOTIL) 2.5-0.025 MG tablet TAKE 1 TABLET BY MOUTH 4 (FOUR)  TIMES DAILY AS NEEDED FOR DIARRHEA OR LOOSE STOOLS.   fluocinonide gel (LIDEX) 0.21 % Apply 1 application topically 3 (three) times daily as needed (rash).   levothyroxine (SYNTHROID) 50 MCG tablet TAKE 1 TABLET EVERY DAY   losartan (COZAAR) 50 MG tablet Take 1 tablet (50 mg total) by mouth daily.   pantoprazole (PROTONIX) 40 MG tablet TAKE 1 TABLET TWICE DAILY   No facility-administered encounter medications on file as of 04/05/2022.    April Beck Active Problem List   Diagnosis Date Noted   Arthritis of carpometacarpal [facility]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 01/16/2022   Closed fracture of left distal radius 10/31/2021   Ovarian cyst 01/16/2021   Chronic constipation 11/21/2020   CKD (chronic kidney disease) stage 3, GFR 30-59 ml/min (HCC) 11/21/2020   Herpes 11/21/2020   Hypokalemia 11/21/2020   Acute blood loss anemia 11/21/2020   Weakness 11/17/2020   Aspiration into airway    Fall    Closed fracture of right hip ([place]) 11/12/2020   Dysphagia, unspecified(787.20) 06/14/2013   Heel pain 06/14/2013   Hypercholesteremia    Hypertension    GERD (gastroesophageal reflux disease)    Hypothyroidism    Renal insufficiency 06/19/2012    Conditions to be addressed/monitored: HLD and CKD Stage 3  Care Plan : RN Care Manager Plan of Care  Updates made by April Mends, RN since 04/05/2022 12:00 AM     Problem: No plan of care established for management of chronic disease state (HLD, CKD3)   Priority: High     Long-Range Goal: Development of plan of care for chronic disease management  (HLD, CKD3)  Start Date: 01/15/2022  Expected End Date: 07/14/2022  Priority: High  Note:   Current Barriers:  Knowledge Deficits related to plan of care for management of HLD and CKD Stage 3  Chronic Disease Management support and education needs related to HLD and CKD Stage 3 April Beck reports she lives alone, is independent with all aspects of April Beck care, continues to do yard work and mow the yard, walks when she  can, continues to drive, has niece and son and daughter in law she can call on if needed. April Beck reports she tries to be careful with April Beck diet and has concern about "chronic kidney disease" and does not want this to get worse. 04/05/22- spoke with April Beck who reports she was taken off meloxicam due to CKD and now taking tylenol arthritis as needed, April Beck reports now on losartan for blood pressure and states last reading 132/85 and has wrist cuff and will start checking at home, continues to try and eat healthy and drink water. RNCM Clinical Goal(s):  April Beck will verbalize understanding of plan for management of HLD and CKD Stage 3 as evidenced by April Beck report, review of EHR and  through collaboration with RN Care manager, provider, and care team.   Interventions: 1:1 collaboration with primary care provider regarding development and update of comprehensive plan of care as evidenced by provider attestation and co-signature Inter-disciplinary care team collaboration (see longitudinal plan of care) Evaluation of current treatment plan related to  self management and April Beck's adherence to plan as established by provider    Chronic Kidney Disease Interventions:  (Status:  New goal. and Goal on track:  Yes.) Long Term Goal Evaluation of current treatment plan related to chronic kidney disease self management and April Beck's adherence to plan as established by provider      Reviewed prescribed diet heart healthy Counseled on the importance of exercise goals with target of 150 minutes per week     Discussed complications of poorly controlled blood pressure such as heart disease, stroke, circulatory complications, vision complications, kidney impairment, sexual dysfunction    Discussed plans with April Beck for ongoing care management follow up and provided April Beck with direct contact information for care management team    Reinforced importance of keeping blood pressure within normal limits and checking at  home, keeping a log Reviewed medication changes with April Beck Plan of care reviewed with April Beck including case closure today Last practice recorded BP readings:  BP Readings from Last 3 Encounters:  10/27/21 (!) 152/82  10/09/21 (!) 142/88  05/16/21 126/82  Most recent eGFR/CrCl:  Lab Results  Component Value Date   EGFR 53 (L) 05/16/2021    No components found for: CRCL   Hyperlipidemia:  (Status: New goal. Goal on Track (progressing): YES.) Long Term Goal  Lab Results  Component Value Date   CHOL 138 03/31/2021   HDL 55 03/31/2021   [place] 67 03/31/2021   TRIG 82 03/31/2021   CHOLHDL 2.5 03/31/2021     Medication review performed; medication list updated in electronic medical record.  Counseled on importance of regular laboratory monitoring as prescribed; Reviewed importance of limiting foods high in cholesterol; Reviewed exercise goals and target of 150 minutes per week; Reviewed heart healthy diet Keep doing your outdoor activities such as working in your yard, mowing, etc.  April Beck Goals/Self-Care Activities: Take medications as prescribed   Attend all scheduled provider appointments Call pharmacy for medication refills 3-7 days in advance of running out of medications Attend church or other social activities Perform  all self care activities independently  Perform IADL's (shopping, preparing meals, housekeeping, managing finances) independently Call provider office for new concerns or questions  - call for medicine refill 2 or 3 days before it runs out - take all medications exactly as prescribed - call doctor with any symptoms you believe are related to your medicine - call doctor when you experience any new symptoms - go to all doctor appointments as scheduled - adhere to prescribed diet: heart healthy Follow heart healthy diet  Drink adequate fluids, preferably water Continue getting outside daily and working around your yard and walking Keep blood pressure  within normal limits, start checking at home Case closure today- please talk with your doctor if you have any future needs for care management        Plan: No further follow up required: case closure today  Jacqlyn Larsen [facility], BSN RN Case Manager [place] [phone]

## 2022-04-05 NOTE — Patient Instructions (Signed)
Visit Information  Thank you for taking time to visit with me today. Please don't hesitate to contact me if I can be of assistance to you before our next scheduled telephone appointment.  Following are the goals we discussed today:  Take medications as prescribed   Attend all scheduled provider appointments Call pharmacy for medication refills 3-7 days in advance of running out of medications Attend church or other social activities Perform all self care activities independently  Perform IADL's (shopping, preparing meals, housekeeping, managing finances) independently Call provider office for new concerns or questions  - call for medicine refill 2 or 3 days before it runs out - take all medications exactly as prescribed - call doctor with any symptoms you believe are related to your medicine - call doctor when you experience any new symptoms - go to all doctor appointments as scheduled - adhere to prescribed diet: heart healthy Follow heart healthy diet  Drink adequate fluids, preferably water Continue getting outside daily and working around your yard and walking Keep blood pressure within normal limits, start checking at home Case closure today- please talk with your doctor if you have any future needs for care management  Please call the care guide team at [phone] if you need to cancel or reschedule your appointment.   If you are experiencing a Mental Health or [place] or need someone to talk to, please call the Suicide and Crisis Lifeline: 988 call the [place] National Suicide Prevention Lifeline: 951-116-0770 or TTY: 6123716683 TTY 684-518-4892) to talk to a trained counselor call 1-800-273-TALK (toll free, 24 hour hotline) go to [facility] [address], [place] [phone]) call 911   Patient verbalizes understanding of instructions and care plan provided today and agrees to view in [place]. Active MyChart  status and patient understanding of how to access instructions and care plan via MyChart confirmed with patient.     No further follow up required: case closure today  Jacqlyn Larsen [facility], BSN RN Case Manager [facility] [phone]

## 2022-04-06 ENCOUNTER — Telehealth: Payer: Medicare Other

## 2022-04-09 ENCOUNTER — Other Ambulatory Visit: Payer: Self-pay | Admitting: Family Medicine

## 2022-04-09 ENCOUNTER — Encounter: Payer: Self-pay | Admitting: Family Medicine

## 2022-04-09 MED ORDER — LOSARTAN POTASSIUM 50 MG PO TABS: 50.0000 mg | ORAL_TABLET | Freq: Every day | ORAL | 3 refills | Status: DC

## 2022-04-12 ENCOUNTER — Telehealth (INDEPENDENT_AMBULATORY_CARE_PROVIDER_SITE_OTHER): Payer: Medicare Other | Admitting: Registered"

## 2022-04-12 ENCOUNTER — Telehealth (INDEPENDENT_AMBULATORY_CARE_PROVIDER_SITE_OTHER): Payer: Self-pay

## 2022-04-12 NOTE — Telephone Encounter (Signed)
Per RD request, this writer contacted pt to schedule r/t missed T2DM class 3.  Pt consented to VIRTUAL date/time, understood how to connect with provider, and had no further questions/concerns at this time.  RD made aware.

## 2022-04-14 ENCOUNTER — Emergency Department: Payer: 59

## 2022-04-14 ENCOUNTER — Emergency Department
Admission: EM | Admit: 2022-04-14 | Discharge: 2022-04-14 | Disposition: A | Discharge status: Home / Self Care | Payer: 59 | Attending: Emergency Medical Services | Admitting: Emergency Medical Services

## 2022-04-14 DIAGNOSIS — X500XXA Overexertion from strenuous movement or load, initial encounter: Secondary | ICD-10-CM | POA: Insufficient documentation

## 2022-04-14 DIAGNOSIS — M25522 Pain in left elbow: Secondary | ICD-10-CM | POA: Insufficient documentation

## 2022-04-14 DIAGNOSIS — M79622 Pain in left upper arm: Secondary | ICD-10-CM | POA: Insufficient documentation

## 2022-04-14 DIAGNOSIS — M79602 Pain in left arm: Secondary | ICD-10-CM

## 2022-04-14 NOTE — Discharge Instructions (Signed)
Please follow-up with your primary doctor to be connected with physical therapy for your elbow pain.  Please return to the ED if you experiencing numbness weakness tingling new or worsening symptoms.  Take 1000 mg Tylenol up to 3 times a day for pain.

## 2022-04-17 NOTE — ED Provider Notes (Signed)
EMERGENCY DEPARTMENT NOTE     Patient initially seen and examined at   ED PHYSICIAN ASSIGNED       Date/Time Event User Comments    04/14/22 1545 Physician Assigned Emilie Rutter, MD assigned as Attending     HISTORY OF PRESENT ILLNESS   Patient Name: Helen [name]   Medical Record Number: [id number]  AGE: 76 y.o.  DOB: October 24, 1945    Chief Complaint: Elbow Pain       Helen [name] is a 76 y.o. female who presents with left elbow pain.  Patient reports she had an elbow injury a few weeks ago after lifting something heavy.  Patient reports that initially got better and then got worse.  She has not been taking any medication for it.  She would like to get an x-ray to make sure that nothing is broken and she will follow-up with her primary doctor.    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    Bilateral cataracts     Breast cancer     Breast lump     Cancer     breast cancer    Carcinoma     Carcinoma of the bladder    Chicken pox     Childhood illness    Conduction Disorder     RBBB    Diabetes mellitus type II     Non-Insulin dependent    Echocardiogram 10/2013    Gastroesophageal reflux disease     Hemorrhoids without complication     Holter monitor 02/2014    Hyperlipidemia     Hypertensive disorder     Malignant neoplasm of breast     Measles     Childhood illness    Mumps     Childhood illness    Myocardial perfusion scan 11/2013, 12/2013    MPI single Isotope excrise; MPI dual Isotope Lexiscan    RBBB     Syncope        Past Surgical History:  Past Surgical History:   Procedure Laterality Date    ABLATION OF DYSRHYTHMIC FOCUS       ARTHROSCOPY, KNEE Left 04/21/2015    Procedure: ARTHROSCOPY, KNEE PARTIAL MEDIAL AND LATERAL MENISECTOMY LEFT KNEE;  Surgeon: Bess Kinds, MD;  Location: [facility];  Service: Orthopedics;  Laterality: Left;  partial medial menisectomy    BREAST BIOPSY      HYSTERECTOMY      total    TUMOR REMOVAL Left     neck  benign        Social History:  Social History     Socioeconomic History    Marital status: Divorced   Tobacco Use    Smoking status: Never    Smokeless tobacco: Never   Vaping Use    Vaping Use: Never used   Substance and Sexual Activity    Alcohol use: No    Drug use: No    Sexual activity: Not Currently  Social Determinants of Health     Food Insecurity: Food Insecurity Present (02/23/2022)    Hunger Vital Sign     Worried About Running Out of Food in the Last Year: Often true     Ran Out of Food in the Last Year: Often true   Transportation Needs: No Transportation Needs (02/23/2022)    PRAPARE - Therapist, art (Medical): No     Lack of Transportation (Non-Medical): No   Physical Activity: Sufficiently Active (02/23/2022)    Exercise Vital Sign     Days of Exercise per Week: 3 days     Minutes of Exercise per Session: 60 min   Stress: No Stress Concern Present (02/23/2022)    [employer] of Occupational Health - Occupational Stress Questionnaire     Feeling of Stress : Not at all   Social Connections: Moderately Integrated (02/23/2022)    Social Connection and Isolation Panel [NHANES]     Frequency of Communication with Friends and Family: Twice a week     Frequency of Social Gatherings with Friends and Family: Twice a week     Attends Religious Services: More than 4 times per year     Active Member of [employer] or Organizations: Yes     Attends Banker Meetings: Never     Marital Status: Divorced   Catering manager Violence: Unknown (02/23/2022)    Humiliation, Afraid, Rape, and Kick questionnaire     Fear of Current or Ex-Partner: Patient refused      Emotionally Abused: No     Physically Abused: No     Sexually Abused: No   Housing Stability: High Risk (02/23/2022)    Housing Stability Vital Sign     Unable to Pay for Housing in the Last Year: Yes     Number of Places Lived in the Last Year: 1     Unstable Housing in the Last Year: No       Family History:  Family History   Problem Relation Age of Onset    Diabetes Mother     Diabetes Sister     Diabetes Sister     Malignant hyperthermia Neg Hx     Pseudochol deficiency Neg Hx     Anesthesia problems Neg Hx        Outpatient Medication:  Discharge Medication List as of 04/14/2022  4:35 PM        CONTINUE these medications which have NOT CHANGED    Details   albuterol (PROVENTIL) (2.5 MG/3ML) 0.083% nebulizer solution Take 3 mLs (2.5 mg total) by nebulization every 4 (four) hours as needed for Shortness of Breath or Wheezing, Starting Tue 05/16/2021, E-Rx      albuterol sulfate HFA (PROVENTIL) 108 (90 Base) MCG/ACT inhaler Inhale 2 puffs into the lungs every 4 (four) hours as needed for Wheezing or Shortness of Breath, Starting Tue 05/16/2021, Until Wed 05/16/2022 at 2359, E-Rx      amLODIPine (NORVASC) 10 MG tablet Take 1 tablet (10 mg) by mouth daily, Starting Fri 03/09/2022, E-Rx      atorvastatin (LIPITOR) 40 MG tablet Take 1 tablet (40 mg) by mouth daily, Starting Mon 01/01/2022, E-Rx      Blood Glucose Monitoring Suppl (ONE TOUCH ULTRA 2) W/DEVICE KIT One daily, Print      Cholecalciferol (Vitamin D3) 2000 UNIT capsule Take by mouth daily, Starting Tue 05/30/2021, Historical Med      desonide (DESOWEN) 0.05 % cream Historical Med  dexAMETHasone (DECADRON) 0.1 % ophthalmic solution Historical Med      diclofenac Sodium (VOLTAREN) 1 % Gel topical gel Apply 2 g topically 4 (four) times daily, Starting Fri 06/09/2021, E-Rx      diphenoxylate-atropine (LOMOTIL) 2.5-0.025 MG per tablet Starting Tue 11/21/2021, Historical Med      empagliflozin (JARDIANCE) 25 MG tablet Take 1 tablet (25 mg) by mouth every morning,  Starting Tue 02/13/2022, Until Wed 02/13/2023, E-Rx      ferrous sulfate 325 (65 FE) MG tablet ferrous sulfate 325 mg (65 mg iron) tablet, Historical Med      fluocinonide (LIDEX) 0.05 % gel Starting Sat 07/15/2021, Historical Med      fluticasone (FLONASE) 50 MCG/ACT nasal spray Starting 06/02/2014, Until Discontinued, Historical Med      fluticasone (Flovent HFA) 220 MCG/ACT inhaler INHALE 1 PUFF INTO THE LUNGS TWICE A DAY, E-Rx      glucose blood (OneTouch Ultra) test strip 2 times daily, E-Rx      Lancets (onetouch ultrasoft) lancets Use to check blood sugars 2 times daily, Print      lidocaine (Lidoderm) 5 % Place 1 patch onto the skin daily as needed (pain) Remove & Discard patch within 12 hours, Starting Tue 09/13/2020, E-Rx      meloxicam (MOBIC) 7.5 MG tablet TAKE 1 TABLET BY MOUTH EVERY DAY, E-Rx      metFORMIN (GLUCOPHAGE) 1000 MG tablet TAKE 1 TABLET BY MOUTH TWICE A DAY, E-Rx      montelukast (SINGULAIR) 10 MG tablet Take 1 tablet (10 mg) by mouth daily, Starting Fri 03/19/2014, Historical Med      NASONEX 50 MCG/ACT nasal spray as needed, Starting Mon 12/28/2013, Historical Med      nitrofurantoin, macrocrystal-monohydrate, (MACROBID) 100 MG capsule Starting Thu 09/14/2021, Historical Med      pantoprazole (PROTONIX) 40 MG tablet TAKE 1 TABLET BY MOUTH EVERY DAY, E-Rx      Tradjenta 5 MG Tab TAKE 1 TABLET (5 MG TOTAL) BY MOUTH DAILY., Starting Tue 04/03/2022, E-Rx      valsartan-hydroCHLOROthiazide (DIOVAN-HCT) 160-12.5 MG per tablet Take 1 tablet by mouth daily, Starting Mon 01/01/2022, E-Rx             REVIEW OF SYSTEMS     Review of System other than what's stated in HPI is negative. See History of Present Illness.    PHYSICAL EXAM     ED Triage Vitals [04/14/22 1528]   Enc Vitals Group      BP 145/80      Heart Rate 87      Resp Rate 19      Temp 97.6 F (36.4 C)      Temp Source Temporal      SpO2 98 %      Weight 74.8 kg      Height 1.575 m      Head Circumference       Peak Flow       Pain Score 8       Pain Loc       Pain Edu?       Excl. in GC?        Constitutional: Resting comfortably no acute distress  Head: Normocephalic, atraumatic.  Eyes: Pupils equal, round,reactive to light.  ZOX:WRUEA oropharynx.  No stridor.  Neck: Supple.  Without meningismus.  Respiratory: Lungs clear to auscultation bilaterally.  No wheeze.  Cardiovascular: Regular rate and rhythm.  No rub.  Abdomen: Soft, non-distended.  Extremities: Good range of  motion on left elbow, tenderness along the bicep, otherwise well-perfused.  No significant edema.  Skin: Warm and dry.  No significant jaundice.  Neurologic: Alert and oriented.  Normal cranial nerve, sensory and motor exam.  Back: Normal on inspection. No midline point tenderness or stepoffs.  Psych: Normal affect, mood congruent    CARDIAC STUDIES      The following cardiac studies were independently interpreted by me the Emergency Medicine Provider.  For full cardiac study results please see chart.    EMERGENCY IMAGING STUDIES    The following imagine studies were independently interpreted by me (emergency physician):    RADIOLOGY IMAGING STUDIES      Elbow Left AP Lateral And Obliques   Final Result         No acute findings.      Carleene Overlie, MD   04/14/2022 4:35 PM          The following imagine studies were independently interpreted by me (emergency medicine provider):    PULSE OXIMETRY    Oxygen Saturation by Pulse Oximetry: 98%  Interventions: none  Interpretation:  Adequate    EMERGENCY DEPT. MEDICATIONS      ED Medication Orders (From admission, onward)      None            LABORATORY RESULTS    Ordered and independently interpreted AVAILABLE laboratory tests. Please see results section in chart for full details.  Results for orders placed or performed in visit on 02/19/22   Hemoglobin A1C   Result Value Ref Range    Hemoglobin A1C 8.4 (H) 4.8 - 5.6 %   Lipid panel   Result Value Ref Range    Cholesterol 138 100 - 199 mg/dL    Triglycerides 58 0 - 149 mg/dL    HDL 64 >16 mg/dL     VLDL Calculated 12 5 - 40 mg/dL    LDL Chol Calculated (NIH) 62 0 - 99 mg/dL    Cholesterol / HDL Ratio 2.2 0.0 - 4.4 ratio       PROCEDURES    Procedures        MEDICAL DECISION MAKING     Based on my initial evaluations orders written.      Patient initially treated with:   Medications - No data to display        PRIMARY PROBLEM LIST      Acute illness/injury with risk to life or bodily function (based on differential diagnosis or evaluation) DIAGNOSIS: Soft tissue of elbow injury     Differential Diagnosis: Extremity Swelling: fracture, dislocation, contusion, bursitis, hematoma, tendon rupture, strain/sprain, DVT, superficial thrombophlebitis   Each of the differential diagnosis has been considered for diagnosis and weighed risk and benefit of further testing and evaluation in the context of patient complaint. Some diagnosis do not warrant further testing including imaging and/or labs. However, all differential diagnosis have been considered.    DISCUSSION      76 year old female presents to the ED with soft tissue injury.  X-ray no acute findings.  Patient feels significantly better and will follow-up with primary doctor for possible physical therapy.    The emergency provider has discussed at length with patient regarding symptoms and diagnostics findings, in addition to providing specific details for the plan of care.             Based on the patient's improved condition, decision made with patient for discharge. Plan discussed with the patient, and all questions and concerns  addressed accordingly. Appropriate return precautions explained, patient states a clear understanding of the plan provided and is comfortable and agreeable for discharge.    If patient is being hospitalized is severe sepsis or septic shock suspected?: No  Was management discussed with a consultant?: No  External Records Reviewed?: Physician Office Records  Past medical records found on epic reviewed.     Vital Signs: Reviewed the  patient's vital signs.   Nursing Notes: Reviewed and utilized available nursing notes.  Medical Records Reviewed: Reviewed available past medical records.  Counseling: The emergency provider has spoken with the patient and discussed today's findings, in addition to providing specific details for the plan of care.  Questions are answered and there is agreement with the plan.      Additional Notes                  MIPS DOCUMENTATION              Prescriptions:  Discharge Medication List as of 04/14/2022  4:35 PM        CONTINUE these medications which have NOT CHANGED    Details   albuterol (PROVENTIL) (2.5 MG/3ML) 0.083% nebulizer solution Take 3 mLs (2.5 mg total) by nebulization every 4 (four) hours as needed for Shortness of Breath or Wheezing, Starting Tue 05/16/2021, E-Rx      albuterol sulfate HFA (PROVENTIL) 108 (90 Base) MCG/ACT inhaler Inhale 2 puffs into the lungs every 4 (four) hours as needed for Wheezing or Shortness of Breath, Starting Tue 05/16/2021, Until Wed 05/16/2022 at 2359, E-Rx      amLODIPine (NORVASC) 10 MG tablet Take 1 tablet (10 mg) by mouth daily, Starting Fri 03/09/2022, E-Rx      atorvastatin (LIPITOR) 40 MG tablet Take 1 tablet (40 mg) by mouth daily, Starting Mon 01/01/2022, E-Rx      Blood Glucose Monitoring Suppl (ONE TOUCH ULTRA 2) W/DEVICE KIT One daily, Print      Cholecalciferol (Vitamin D3) 2000 UNIT capsule Take by mouth daily, Starting Tue 05/30/2021, Historical Med      desonide (DESOWEN) 0.05 % cream Historical Med      dexAMETHasone (DECADRON) 0.1 % ophthalmic solution Historical Med      diclofenac Sodium (VOLTAREN) 1 % Gel topical gel Apply 2 g topically 4 (four) times daily, Starting Fri 06/09/2021, E-Rx      diphenoxylate-atropine (LOMOTIL) 2.5-0.025 MG per tablet Starting Tue 11/21/2021, Historical Med      empagliflozin (JARDIANCE) 25 MG tablet Take 1 tablet (25 mg) by mouth every morning, Starting Tue 02/13/2022, Until Wed 02/13/2023, E-Rx      ferrous sulfate 325 (65 FE) MG  tablet ferrous sulfate 325 mg (65 mg iron) tablet, Historical Med      fluocinonide (LIDEX) 0.05 % gel Starting Sat 07/15/2021, Historical Med      fluticasone (FLONASE) 50 MCG/ACT nasal spray Starting 06/02/2014, Until Discontinued, Historical Med      fluticasone (Flovent HFA) 220 MCG/ACT inhaler INHALE 1 PUFF INTO THE LUNGS TWICE A DAY, E-Rx      glucose blood (OneTouch Ultra) test strip 2 times daily, E-Rx      Lancets (onetouch ultrasoft) lancets Use to check blood sugars 2 times daily, Print      lidocaine (Lidoderm) 5 % Place 1 patch onto the skin daily as needed (pain) Remove & Discard patch within 12 hours, Starting Tue 09/13/2020, E-Rx      meloxicam (MOBIC) 7.5 MG tablet TAKE 1 TABLET BY MOUTH EVERY DAY,  E-Rx      metFORMIN (GLUCOPHAGE) 1000 MG tablet TAKE 1 TABLET BY MOUTH TWICE A DAY, E-Rx      montelukast (SINGULAIR) 10 MG tablet Take 1 tablet (10 mg) by mouth daily, Starting Fri 03/19/2014, Historical Med      NASONEX 50 MCG/ACT nasal spray as needed, Starting Mon 12/28/2013, Historical Med      nitrofurantoin, macrocrystal-monohydrate, (MACROBID) 100 MG capsule Starting Thu 09/14/2021, Historical Med      pantoprazole (PROTONIX) 40 MG tablet TAKE 1 TABLET BY MOUTH EVERY DAY, E-Rx      Tradjenta 5 MG Tab TAKE 1 TABLET (5 MG TOTAL) BY MOUTH DAILY., Starting Tue 04/03/2022, E-Rx      valsartan-hydroCHLOROthiazide (DIOVAN-HCT) 160-12.5 MG per tablet Take 1 tablet by mouth daily, Starting Mon 01/01/2022, E-Rx             DIAGNOSIS      Diagnosis:  Final diagnoses:   Pain of left upper extremity   Left elbow pain       Disposition:  ED Disposition       ED Disposition   Discharge    Condition   --    Date/Time   Sat Apr 14, 2022  4:33 PM    Comment   Angelene Giovanni discharge to home/self care.    Condition at disposition: Stable                 This note was generated by the Epic EMR system/Dragon speech recognition and may contain inherent errors or omissions not intended by the user. Grammatical errors, random  word insertions, deletions, pronoun errors and incomplete sentences are occasional consequences of this technology due to software limitations. Not all errors are caught or corrected. If there are questions or concerns about the content of this note or information contained within the body of this dictation they should be addressed directly with the author for clarification.     Macky Lower, MD  04/17/22 [phone]

## 2022-04-17 NOTE — Progress Notes (Unsigned)
[facility]  Telemedicine Visit  via HIPAA compliant secure video link   Date 04/18/2022 (Start time 1300 -  End Time 1409)    Originating site (Patient location): Home   Distant site (Provider location): [facility]  Consent obtained: Yes (verbal)   Language: English     Helen [name], referred by Syliva Overman, MD, attended the third class of the Living Well with Type 2 Diabetes Series today.      The individual(s) attending were:  patient    The Educator assessed their overall outcome of learning upon completion of the classes to be: Fully Achieved    The Educator has noticed there may be some barriers to learning related to the following: None identified    Topics covered and discussed included:  Risk reduction  Regular tests and vaccines schedule  Foot care  Eye care  Dental care  Risks of smoking (if applicable)  Emergency preparedness  Adopting to changing situations  In case of disaster - emergency plan  Traveling  Nutrition and food topics:  Dietary ways to reduce inflammation  Heart healthy nutrition & lifestyle     Including types of fat, fiber, sodium  Review of reading labels  Preparing for nutrition visit  How to complete Food and Activity records  Diabetes Self-Management Support Resources reviewed and handout provided:  Websites for: physical activity, healthy eating, problem solving, risk reduction, healthy coping, weight loss, children & adolescents, publications, travel and general diabetes information  Apps for smart phones    Behavior Change Goal: Consuming 30g of CHO for snacks; self-rated today as: 70%    Comments: None     Patient was encouraged to email/call this provider with any concerns or questions.     Plan: Patient is scheduled for the following visits:   MNT follow up with an RD     Follow up with referring provider/ endocrinologist/ PCP

## 2022-04-18 ENCOUNTER — Telehealth (INDEPENDENT_AMBULATORY_CARE_PROVIDER_SITE_OTHER): Payer: Medicare Other

## 2022-04-18 DIAGNOSIS — Z794 Long term (current) use of insulin: Secondary | ICD-10-CM

## 2022-04-18 DIAGNOSIS — E1165 Type 2 diabetes mellitus with hyperglycemia: Secondary | ICD-10-CM

## 2022-04-20 ENCOUNTER — Encounter (INDEPENDENT_AMBULATORY_CARE_PROVIDER_SITE_OTHER): Payer: Self-pay | Admitting: Family

## 2022-04-21 ENCOUNTER — Other Ambulatory Visit (INDEPENDENT_AMBULATORY_CARE_PROVIDER_SITE_OTHER): Payer: Self-pay | Admitting: Family

## 2022-04-21 DIAGNOSIS — E1165 Type 2 diabetes mellitus with hyperglycemia: Secondary | ICD-10-CM

## 2022-04-30 ENCOUNTER — Other Ambulatory Visit: Payer: Self-pay | Admitting: Family Medicine

## 2022-04-30 ENCOUNTER — Encounter: Payer: Self-pay | Admitting: Family Medicine

## 2022-04-30 ENCOUNTER — Encounter (INDEPENDENT_AMBULATORY_CARE_PROVIDER_SITE_OTHER): Payer: Self-pay | Admitting: Family

## 2022-04-30 MED ORDER — DIPHENOXYLATE-ATROPINE 2.5-0.025 MG PO TABS
1.0000 | ORAL_TABLET | Freq: Four times a day (QID) | ORAL | 0 refills | Status: DC | PRN
Start: 2022-04-30 — End: 2022-08-06

## 2022-04-30 NOTE — Telephone Encounter (Deleted)
Pt has appt today to see Dr. Dennard Schaumann.

## 2022-04-30 NOTE — Telephone Encounter (Signed)
Spoke w/pt, aware Rx has been sent to pharmacy. Per pt will try it this time again and see if it helps. If not, will make an appt w/pcp next time. Agreed per pt.   Nothing further.

## 2022-05-01 ENCOUNTER — Encounter: Payer: Self-pay | Admitting: Family Medicine

## 2022-05-05 ENCOUNTER — Encounter: Payer: Self-pay | Admitting: Family Medicine

## 2022-05-09 ENCOUNTER — Ambulatory Visit (INDEPENDENT_AMBULATORY_CARE_PROVIDER_SITE_OTHER): Payer: Medicare Other | Admitting: Family

## 2022-05-15 ENCOUNTER — Telehealth (INDEPENDENT_AMBULATORY_CARE_PROVIDER_SITE_OTHER): Payer: Self-pay | Admitting: Registered"

## 2022-05-15 ENCOUNTER — Telehealth (INDEPENDENT_AMBULATORY_CARE_PROVIDER_SITE_OTHER): Payer: Medicare Other | Admitting: Registered"

## 2022-05-15 NOTE — Telephone Encounter (Signed)
05/15/22- Pt no show to MNT post LWWD classes. LVM to reschedule if pt was unable to join the call by 1050am.    Lysbeth Penner, RD CDCES

## 2022-05-16 ENCOUNTER — Encounter (INDEPENDENT_AMBULATORY_CARE_PROVIDER_SITE_OTHER): Payer: Self-pay | Admitting: Endocrinology, Diabetes and Metabolism

## 2022-05-16 DIAGNOSIS — E1165 Type 2 diabetes mellitus with hyperglycemia: Secondary | ICD-10-CM

## 2022-05-17 ENCOUNTER — Ambulatory Visit (INDEPENDENT_AMBULATORY_CARE_PROVIDER_SITE_OTHER): Payer: 59 | Admitting: Family

## 2022-05-17 ENCOUNTER — Encounter (INDEPENDENT_AMBULATORY_CARE_PROVIDER_SITE_OTHER): Payer: Self-pay | Admitting: Family

## 2022-05-17 VITALS — BP 143/77 | HR 84 | Temp 98.0°F | Wt 164.6 lb

## 2022-05-17 DIAGNOSIS — M25522 Pain in left elbow: Secondary | ICD-10-CM

## 2022-05-17 NOTE — Progress Notes (Signed)
Have you seen any specialists/other providers since your last visit with [place]?    Yes  [facility] on [address]     The patient was informed that the following HM items are still outstanding:   Health Maintenance Due   Topic Date Due    Advance Directive on File  Never done    Shingrix Vaccine 50+ (1) Never done    DEPRESSION SCREENING  Never done    HEPATITIS C SCREENING  Never done    COVID-19 Vaccine (4 - Moderna series) 09/28/2020    DM OPHTHALMOLOGY EXAM  09/17/2021    FALLS RISK ANNUAL  02/16/2022    Medicare Annual Wellness Visit  02/17/2022    INFLUENZA VACCINE  04/03/2022

## 2022-05-17 NOTE — Progress Notes (Signed)
[facility]  Laurell Josephs  PROGRESS NOTE      Patient: Helen [name]   Date: 05/17/2022   MRN: [id number]     Helen [name] is a 76 y.o. female    Chief Complaint   Patient presents with    Elbow Injury     Injured left elbow while doing lawn work, persistent pain x 1 month. No fracture, concerned about a torn ligament.        MEDICATIONS     Current Outpatient Medications   Medication Sig Dispense Refill    amLODIPine (NORVASC) 10 MG tablet Take 1 tablet (10 mg) by mouth daily 90 tablet 0    atorvastatin (LIPITOR) 40 MG tablet Take 1 tablet (40 mg) by mouth daily 90 tablet 1    Blood Glucose Monitoring Suppl (ONE TOUCH ULTRA 2) W/DEVICE KIT One daily 1 each 0    Cholecalciferol (Vitamin D3) 2000 UNIT capsule Take by mouth daily      desonide (DESOWEN) 0.05 % cream       dexAMETHasone (DECADRON) 0.1 % ophthalmic solution       diclofenac Sodium (VOLTAREN) 1 % Gel topical gel Apply 2 g topically 4 (four) times daily 50 g 0    diphenoxylate-atropine (LOMOTIL) 2.5-0.025 MG per tablet       empagliflozin (JARDIANCE) 25 MG tablet Take 1 tablet (25 mg) by mouth every morning 30 tablet 3    ferrous sulfate 325 (65 FE) MG tablet ferrous sulfate 325 mg (65 mg iron) tablet      fluocinonide (LIDEX) 0.05 % gel       fluticasone (FLONASE) 50 MCG/ACT nasal spray       glipiZIDE (GLUCOTROL) 10 MG tablet TAKE 2 TABLETS (20 MG) BY MOUTH 2 (TWO) TIMES DAILY BEFORE MEALS 360 tablet 0    glucose blood (OneTouch Ultra) test strip 2 times daily 200 each 3    Lancets (onetouch ultrasoft) lancets Use to check blood sugars 2 times daily 200 each 3    lidocaine (Lidoderm) 5 % Place 1 patch onto the skin daily as needed (pain) Remove & Discard patch within 12 hours 30 patch 0    metFORMIN (GLUCOPHAGE) 1000 MG tablet TAKE 1 TABLET BY MOUTH TWICE A DAY 180 tablet 0    montelukast (SINGULAIR) 10 MG tablet Take 1 tablet (10 mg) by mouth daily      pantoprazole (PROTONIX) 40 MG tablet TAKE 1 TABLET BY MOUTH EVERY DAY 90 tablet 1    Tradjenta 5  MG Tab TAKE 1 TABLET (5 MG TOTAL) BY MOUTH DAILY. 90 tablet 0    valsartan-hydroCHLOROthiazide (DIOVAN-HCT) 160-12.5 MG per tablet Take 1 tablet by mouth daily 90 tablet 3    albuterol (PROVENTIL) (2.5 MG/3ML) 0.083% nebulizer solution Take 3 mLs (2.5 mg total) by nebulization every 4 (four) hours as needed for Shortness of Breath or Wheezing (Patient not taking: Reported on 05/17/2022) 75 mL 1    fluticasone (Flovent HFA) 220 MCG/ACT inhaler INHALE 1 PUFF INTO THE LUNGS TWICE A DAY (Patient not taking: Reported on 05/17/2022) 3 each 1    meloxicam (MOBIC) 7.5 MG tablet TAKE 1 TABLET BY MOUTH EVERY DAY (Patient not taking: Reported on 05/17/2022) 90 tablet 0    NASONEX 50 MCG/ACT nasal spray as needed (Patient not taking: Reported on 05/17/2022)  5     No current facility-administered medications for this visit.       Allergies   Allergen Reactions    Aspirin  Break out in sweat and dizziness     Peppers     Pollen Extract     Latex Itching and Rash       SUBJECTIVE     Chief Complaint   Patient presents with    Elbow Injury     Injured left elbow while doing lawn work, persistent pain x 1 month. No fracture, concerned about a torn ligament.         HPI  Left elbow pain for about 1 month with pulling/ twisting injury doing yard work. Did have xray at [facility] which was negative. Admits localized swelling. Denies numbness/ weakness in extremity. Pain 8/10 at its worst.    Has been using ice/ heat/ rest without relief      ROS     Review of Systems   Musculoskeletal:  Positive for arthralgias and joint swelling.   Skin:  Negative for color change and wound.   Neurological:  Negative for weakness and numbness.             The following portions of the patient's history were reviewed and updated as appropriate: Allergies, Current Medications, Past Family History, Past Medical history, Past social history, Past surgical history, and Problem List.    PHYSICAL EXAM     Vitals:    05/17/22 1351   BP: 143/77   BP Site:  Left arm   Patient Position: Sitting   Cuff Size: Medium   Pulse: 84   Temp: 98 F (36.7 C)   TempSrc: Temporal   SpO2: 96%   Weight: 74.7 kg (164 lb 9.6 oz)       Physical Exam  Vitals and nursing note reviewed.   Constitutional:       General: She is awake. She is not in acute distress.     Appearance: Normal appearance.   Pulmonary:      Effort: Pulmonary effort is normal.   Musculoskeletal:      Left elbow: Swelling (minimal localized) present. No deformity. Normal range of motion. Tenderness present in medial epicondyle.      Comments: Grip strength WNL   Skin:     General: Skin is warm and dry.      Capillary Refill: Capillary refill takes less than 2 seconds.   Neurological:      Mental Status: She is alert and oriented to person, place, and time.   Psychiatric:         Mood and Affect: Mood normal.         Speech: Speech normal.         Behavior: Behavior normal.       ASSESSMENT/PLAN        1. Left elbow pain  Referral to [facility] ([place])        Acute, uncontrolled. Recommend ortho consult, referral provided. Activity modification recommended.     Ddx: sprain, strain, epicondylitis,  fracture     Risk & Benefits of the new medication(s) were explained to the patient (and family) who verbalized understanding & agreed to the treatment plan. Patient (family) encouraged to contact me/clinical staff with any questions/concerns      Return if symptoms worsen or fail to improve.    Signed,  Myer Haff, FNP, FNP  05/17/2022

## 2022-05-22 ENCOUNTER — Ambulatory Visit (INDEPENDENT_AMBULATORY_CARE_PROVIDER_SITE_OTHER): Payer: Medicare Other | Admitting: Family Medicine

## 2022-05-23 ENCOUNTER — Encounter (INDEPENDENT_AMBULATORY_CARE_PROVIDER_SITE_OTHER): Payer: Self-pay | Admitting: Endocrinology, Diabetes and Metabolism

## 2022-05-23 DIAGNOSIS — E1165 Type 2 diabetes mellitus with hyperglycemia: Secondary | ICD-10-CM

## 2022-05-24 ENCOUNTER — Ambulatory Visit (INDEPENDENT_AMBULATORY_CARE_PROVIDER_SITE_OTHER): Payer: 59 | Admitting: Family

## 2022-05-24 ENCOUNTER — Encounter (INDEPENDENT_AMBULATORY_CARE_PROVIDER_SITE_OTHER): Payer: Self-pay | Admitting: Family

## 2022-05-24 VITALS — BP 136/74 | HR 74 | Temp 97.9°F | Ht 61.71 in | Wt 161.6 lb

## 2022-05-24 DIAGNOSIS — M542 Cervicalgia: Secondary | ICD-10-CM

## 2022-05-24 DIAGNOSIS — Z9181 History of falling: Secondary | ICD-10-CM

## 2022-05-24 DIAGNOSIS — D649 Anemia, unspecified: Secondary | ICD-10-CM | POA: Insufficient documentation

## 2022-05-24 DIAGNOSIS — Z1239 Encounter for other screening for malignant neoplasm of breast: Secondary | ICD-10-CM

## 2022-05-24 DIAGNOSIS — I10 Essential (primary) hypertension: Secondary | ICD-10-CM

## 2022-05-24 DIAGNOSIS — G8929 Other chronic pain: Secondary | ICD-10-CM

## 2022-05-24 DIAGNOSIS — E119 Type 2 diabetes mellitus without complications: Secondary | ICD-10-CM

## 2022-05-24 DIAGNOSIS — Z0000 Encounter for general adult medical examination without abnormal findings: Secondary | ICD-10-CM

## 2022-05-24 DIAGNOSIS — M549 Dorsalgia, unspecified: Secondary | ICD-10-CM

## 2022-05-24 NOTE — Progress Notes (Signed)
[facility] - Helen [name] is a 76 y.o. female who presents today for the following Medicare Wellness Visit:  []  Initial Preventive Physical Exam (IPPE) - "Welcome to Medicare" preventive visit (Vision Screening required)   []  Annual Wellness Visit - Initial  [x]  Annual Wellness Visit - Subsequent    DMII: she established with endo, due for followup  GERD: taking patoprazole daily but continues with symptoms  Continues with severe back pain: has seen specialists but she does not want to do injections or surgery, would like referral for in home PT         Health Risk Assessment:   During the past month, how would you rate your general health?:  Fair  Which of the following tasks can you do without assistance - drive or take the bus alone; shop for groceries or clothes; prepare your own meals; do your own housework/laundry; handle your own finances/pay bills; eat, bathe or get around your home?: Shop for groceries or clothes, Prepare your own meals, Do your own housework/laundry, Handle your own finances/pay bills, Eat, bathe, dress or get around your home  Which of the following problems have you been bothered by in the past month - dizzy when standing up; problems using the phone; feeling tired or fatigued; moderate or severe body pain?: Moderate or severe body pain  Do you exercise for about 20 minutes 3 or more days per week?:Yes  During the past month was someone available to help if you needed and wanted help?  For example, if you felt nervous, lonely, got sick and had to stay in bed, needed someone to talk to, needed help with daily chores or needed help just taking care of yourself.: Yes  Do you always wear a seat belt?: Yes  Do you have any trouble taking medications the way you have been told to take them?: No  Have you been given any information that can help you with keeping track of your medications?: Yes  Do you have trouble paying for your medications?: No  Have  you been given any information that can help you with hazards in your house, such as scatter rugs, furniture, etc?: Yes  Do you feel unsteady when standing or walking?: Yes  Do you worry about falling?: No  Have you fallen two or more times in the past year?: Yes  Did you suffer any injuries from your falls in the past year?: Yes     Care Team:   Patient Care Team:  Myer Haff, FNP as PCP - General (Family Nurse Practitioner)  Blanchard Mane, MD as Consulting Physician (Medical Oncology)  Dollene Cleveland, MD  Sharrell Ku, MD as Consulting Physician (Allergy and Immunology)  Nilsa Nutting as Technician  Vlacancich, Ruben Reason, DO as Consulting Physician (Cardiology)      Hospitalizations:   Hospitalization within past year: [x]  No  []  Yes     Diagnosis:      Screenings:       02/10/2020     3:42 PM 02/16/2021     1:54 PM 05/24/2022    10:30 AM   Ambulatory Screenings   Falls Risk: Fallen more than 2 times in past year Y Y Y   Falls Risk: Suffer any injuries? Jerrel Ivory   Depression: PHQ2 Total Score   0   Depression: PHQ9 Total Score   0  Substance Use Disorder Screen:  In the past year, how often have you used the following?  1) Alcohol (For men, 5 or more drinks a day. For women, 4 or more drinks a day)  [x]  Never []  Once or Twice []  Monthly []  Weekly []  Daily or Almost Daily  2) Tobacco Products  [x]  Never []  Once or Twice []  Monthly []  Weekly []  Daily or Almost Daily  3) Prescription Drugs for Non-Medical Reasons  [x]  Never []  Once or Twice []  Monthly []  Weekly []  Daily or Almost Daily  4) Illegal Drugs  [x]  Never []  Once or Twice []  Monthly []  Weekly []  Daily or Almost Daily             Functional Ability/Level of Safety:   Falls Risk/Home Safety Assessment:  ( see HRA and Screenings sections for additional assessment)  Home Safety: [x]  Stair handrails  [x]  Skid-resistant rugs/remove throw rugs   [x]  Grab bars  [x]  Clear pathways between rooms  [x]  Proper lighting stairs/  bathrooms/bedrooms  Get Up and Go (optional):  []   <20 secs  []   >20 secs    []   High risk for falls - Home Safety/Falls Risk Precautions reviewed with pt/family    Hearing Assessment:  Concerns for hearing loss: [x]  Yes  []   No  Hearing aids:   []   Right  []   Left  []   Bilateral   [x]   None  Whisper Test (optional):  []  Normal  []   Slightly decreased  []   Significantly decreased    Plans to make appt with audiology    Exercise:  Frequency:  []   No formal exercise  []   1-2x/wk  []   3-4x/wk  [x]   >4x/wk  Duration:  []   15-30 mins/day  []   30-45 mins/day  [x]   45+ mins/day  Intensity:  []   Light  [x]   Moderate  []   Heavy    Walking, stepper, PT        Activities of Daily Living:   ADL's Independent Minimal  Assistance Moderate  Assistance Total   Assistance   Bathing [x]  []  []  []    Dressing [x]  []  []  []    Mobility   [x]  []  []  []    Transfer [x]  []  []  []    Eating [x]  []  []  []    Toileting [x]  []  []  []      IADL's Independent Minimal  Assistance Moderate  Assistance Total   Assistance   Phone [x]  []  []  []    Housekeeping [x]  []  []  []    Laundry [x]  []  []  []    Transportation [x]  []  []  []    Medications [x]  []  []  []    Finances [x]  []  []  []       ADL assistance: [x]  No assistance needed  []  Spouse  []  Sibling  []  Son   [x]  Daughter []  Children  []  Home Health Aide []  Other:       Advance Care Planning:   Discussion of Advance Directives:   []  Advance Directive in chart  [x]  Advance Directive not in chart - requested to provide []  No Advance Directive.  Form Provided  []  No Advance Directive.  Pt declines. []  Not addressed today  []  Other:     Exam:   BP 136/74 (BP Site: Left arm, Patient Position: Sitting, Cuff Size: Medium)   Pulse 74   Temp 97.9 F (36.6 C) (Temporal)   Ht 1.567 m (5' 1.71")   Wt 73.3 kg (161 lb 9.6 oz)   SpO2 97%  BMI 29.83 kg/m      Physical Exam     General Examination:   GENERAL APPEARANCE: alert, in no acute distress, well developed, well nourished   ORAL CAVITY: normal oropharynx, normal lips,  mucosa moist.   NECK/THYROID: neck supple, no carotid bruit, no neck mass palpated, no jugular venous distention, no thyromegaly.  HEART: S1, S2 normal, + systolic murmur; no rubs, gallops; normal regular rate and rhythm.   LUNGS: normal effort / no distress, normal breath sounds, clear to auscultation bilaterally, no wheezes, rales, rhonchi.   EXTREMITIES: No LE edema bilaterally.      Evaluation of Cognitive Function:   Mood/affect: [x]  Appropriate  []   Other:   Appearance: [x]  Neatly groomed  [x]  Adequately nourished  []  Other:  Family member/caregiver input: []  Present - no concerns  [x]   Not present in room  []  Present - concerns:    Cognitive Assessment:  Mini-Cog Result (three word registration- banana, sunrise, chair / clock drawing):   []   > 3 points - negative screen for dementia   [x]  3 recalled words - negative screen for dementia   []  1-2 recalled words and normal clock draw - negative for cognitive impairment   []  1-2 recalled words and abnormal clock draw - positive for cognitive impairment   []  0 recalled words - positive for cognitive impairment         Assessment/Plan:   1. Medicare annual wellness visit, subsequent    2. Type 2 diabetes mellitus without complication, without long-term current use of insulin  - Hemoglobin A1C  - Comprehensive metabolic panel  - CBC and differential  Chronic, uncontrolled. She is working with endo and has recently completed nutritional education. Due for follow up.     3. Primary hypertension  Chronic, stable. Med refill OK .     4. Chronic neck and back pain  - Referral to Physical Therapy - EXTERNAL; Future  Chronic, uncontrolled. Referral provided.     5. At risk for falls  - Referral to Physical Therapy - EXTERNAL; Future  Chronic, worsening due to back pain/ gait instability- referral for PT.     6. Screening breast examination  - Mammo Digital 2D Screening Bilateral W CAD; Future      Education/Counseling:  During the course of the visit, the patient was  educated and counseled about appropriate screening and preventive services including:   Influenza vaccine- not in stock- get from outside pharmacy or call next week  Shingrix vaccine  COVID vaccinenot in stock- get from outside pharmacy  Physical activity/exercise counseling  Heart healthy diet counseling  Falls risk prevention and home safety      Falls Risk/Home Safety:  The following falls risk/home safety measures were discussed with the patient:  [x]  Home Environment - 1) clear floors, walkways, and stairs of items 2) Remove throw rugs or use non-skid rugs 3) Install grab bars in bathroom 4) Use of handrails with stairs 5) Proper lighting of hallways, bathroom, stairs, bedroom  [x]  Physical Activity - age appropriate activity/exercise 150 mins per week.  [x]  Nutrition - adequate hydration, limit soda, coffee, alcohol   [x]  Vision - yearly eye exam, keep glasses clean  [x]  Medications - side effect education       Myer HaffMeaghan V Javel Hersh, FNP    05/24/2022     The following sections were reviewed this encounter by the provider:   Allergies  Meds          History:  Patient Active Problem List   Diagnosis    Hypertension    Hyperlipidemia    H/O total hysterectomy with removal of both tubes and ovaries    Acute viral syndrome    Heart block AV first degree    RBBB (right bundle branch block)    Palpitations    Pure hypercholesterolemia, unspecified    Nonspecific abnormal electrocardiogram (ECG) (EKG)    Type 2 diabetes mellitus without complication, without long-term current use of insulin    Type 2 diabetes mellitus with hyperglycemia    Anemia      Past Medical History:   Diagnosis Date    Anemia     iron    currently on medication      Arthritis     general body      Arthritis     Generalized    Asthma     Asthma without status asthmaticus     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    Bilateral cataracts     Breast cancer     Breast lump     Cancer     breast cancer    Carcinoma     Carcinoma  of the bladder    Chicken pox     Childhood illness    Conduction Disorder     RBBB    Diabetes mellitus type II     Non-Insulin dependent    Echocardiogram 10/2013    Gastroesophageal reflux disease     Hemorrhoids without complication     Holter monitor 02/2014    Hyperlipidemia     Hypertensive disorder     Malignant neoplasm of breast     Measles     Childhood illness    Mumps     Childhood illness    Myocardial perfusion scan 11/2013, 12/2013    MPI single Isotope excrise; MPI dual Isotope Lexiscan    RBBB     Syncope      Past Surgical History:   Procedure Laterality Date    ABLATION OF DYSRHYTHMIC FOCUS      ARTHROSCOPY, KNEE Left 04/21/2015    Procedure: ARTHROSCOPY, KNEE PARTIAL MEDIAL AND LATERAL MENISECTOMY LEFT KNEE;  Surgeon: Bess Kinds, MD;  Location: [facility];  Service: Orthopedics;  Laterality: Left;  partial medial menisectomy    BREAST BIOPSY      HYSTERECTOMY      total    TUMOR REMOVAL Left     neck  benign      Allergies   Allergen Reactions    Aspirin      Break out in sweat and dizziness     Peppers     Pollen Extract     Latex Itching and Rash      Outpatient Medications Marked as Taking for the 05/24/22 encounter (Office Visit) with Myer Haff, FNP   Medication Sig Dispense Refill    amLODIPine (NORVASC) 10 MG tablet Take 1 tablet (10 mg) by mouth daily 90 tablet 0    atorvastatin (LIPITOR) 40 MG tablet Take 1 tablet (40 mg) by mouth daily 90 tablet 1    Blood Glucose Monitoring Suppl (ONE TOUCH ULTRA 2) W/DEVICE KIT One daily 1 each 0    Cholecalciferol (Vitamin D3) 2000 UNIT capsule Take by mouth daily      desonide (DESOWEN) 0.05 % cream       dexAMETHasone (DECADRON) 0.1 % ophthalmic solution  diclofenac Sodium (VOLTAREN) 1 % Gel topical gel Apply 2 g topically 4 (four) times daily 50 g 0    diphenoxylate-atropine (LOMOTIL) 2.5-0.025 MG per tablet       empagliflozin (JARDIANCE) 25 MG tablet Take 1 tablet (25 mg) by mouth every morning 30 tablet 3    ferrous sulfate  325 (65 FE) MG tablet ferrous sulfate 325 mg (65 mg iron) tablet      fluocinonide (LIDEX) 0.05 % gel       glipiZIDE (GLUCOTROL) 10 MG tablet TAKE 2 TABLETS (20 MG) BY MOUTH 2 (TWO) TIMES DAILY BEFORE MEALS 360 tablet 0    glucose blood (OneTouch Ultra) test strip 2 times daily 200 each 3    Lancets (onetouch ultrasoft) lancets Use to check blood sugars 2 times daily 200 each 3    lidocaine (Lidoderm) 5 % Place 1 patch onto the skin daily as needed (pain) Remove & Discard patch within 12 hours 30 patch 0    metFORMIN (GLUCOPHAGE) 1000 MG tablet TAKE 1 TABLET BY MOUTH TWICE A DAY 180 tablet 0    montelukast (SINGULAIR) 10 MG tablet Take 1 tablet (10 mg) by mouth daily      pantoprazole (PROTONIX) 40 MG tablet TAKE 1 TABLET BY MOUTH EVERY DAY 90 tablet 1    Tradjenta 5 MG Tab TAKE 1 TABLET (5 MG TOTAL) BY MOUTH DAILY. 90 tablet 0    valsartan-hydroCHLOROthiazide (DIOVAN-HCT) 160-12.5 MG per tablet Take 1 tablet by mouth daily 90 tablet 3     Social History     Tobacco Use    Smoking status: Never    Smokeless tobacco: Never   Vaping Use    Vaping Use: Never used   Substance Use Topics    Alcohol use: No    Drug use: Never      Family History   Problem Relation Age of Onset    Diabetes Mother     Diabetes Sister     Diabetes Sister     Malignant hyperthermia Neg Hx     Pseudochol deficiency Neg Hx     Anesthesia problems Neg Hx            ===================================================================    Additional Documentation:

## 2022-05-24 NOTE — Progress Notes (Signed)
Have you seen any specialists/other providers since your last visit with [place]?    No    The patient was informed that the following HM items are still outstanding:   Health Maintenance Due   Topic Date Due    Advance Directive on File  Never done    Shingrix Vaccine 50+ (1) Never done    COVID-19 Vaccine (4 - Moderna series) 09/28/2020    DM OPHTHALMOLOGY EXAM  09/17/2021    INFLUENZA VACCINE  04/03/2022

## 2022-05-24 NOTE — Patient Instructions (Addendum)
MEDICARE WELLNESS PERSONAL PREVENTION PLAN   As part of the Medicare Wellness portion of your visit today, we are providing you with this personalized preventative plan of care. We have listed below some of the preventative services that are recommended for patients based upon their age and gender. These recommendations are taken directly from the [place] [employer] (USPSTF) and the [employer] (ACIP).     Health Maintenance   Topic Date Due    Advance Directive on File  Never done    Shingrix Vaccine 50+ (1) Never done    COVID-19 Vaccine (4 - Moderna series) 09/28/2020    INFLUENZA VACCINE  04/03/2022    URINE MICROALBUMIN  12/26/2022    HEMOGLOBIN A1C ANNUAL  02/20/2023    Statin Use  02/21/2023    DM OPHTHALMOLOGY EXAM  03/04/2023    FALLS RISK ANNUAL  05/25/2023    DEPRESSION SCREENING  05/25/2023    Medicare Annual Wellness Visit  05/25/2023    Tetanus Ten-Year  04/25/2028    HEPATITIS C SCREENING  Completed    DXA Scan  Completed    Pneumonia Vaccine Age 74+  Completed    Colorectal Cancer Screening  Discontinued    PAP SMEAR  Discontinued            Immunization History   Administered Date(s) Administered    COVID-19 mRNA MONOVALENT vaccine PRIMARY SERIES 12 years and above (Moderna) 100 mcg/0.5 mL 12/17/2019, 01/14/2020    COVID-19 mRNA MONOVALENT vaccine PRIMARY SERIES 12 years and above [employer]) 30 mcg/0.3 mL (DILUTE BEFORE USE) 08/03/2020    INFLUENZA HIGH DOSE 65 YRS+ 05/19/2014, 09/24/2016, 06/12/2017, 06/20/2021    INFLUENZA HIGH DOSE 65 YRS+ Quad 0.7 mL 05/02/2019, 05/07/2020    Influenza (Im) Preservative Free 07/08/2009, 07/08/2009, 09/15/2010, 09/15/2010, 07/09/2011, 07/09/2011, 06/06/2012, 06/06/2012, 06/03/2013, 06/03/2013, 07/04/2013, 07/04/2013    Pneumococcal 23 valent 09/15/2010, 09/04/2011, 05/05/2012    Pneumococcal Conjugate 13-Valent 06/10/2018    Tdap 04/25/2018        Your major risk factors:   Recommendations for improvement:       The list below includes many common screening recommendations but is not meant to be comprehensive. You may be eligible for other preventative services depending upon your personal risk factors.   Colorectal Cancer Screening - All adults age 15-75 should undergo periodic colorectal cancer screening. The decision to screen for colorectal cancer in adults aged 71 to 77 years should be an individual one,taking into account your overall health and prior screening history.   Breast Cancer Screening - Women age 11-74 should have mammograms every other year (please note that this recommendation may not be appropriate for every woman - your physician can answer specific questions you may have). The USPSTF concludes that the current evidence is insufficient to assess the balance of benefits and harms of screening mammography in women aged 65 years or older.    Cervical Cancer Screening - Women over 87 do not require pap smears as long as prior screening has been normal and are not otherwise at high risk for cervical cancer. For women aged 2 to 74 years, the USPSTF recommends screening every 3 years with cervical cytology alone, every 5 years with high-risk human papillomavirus (hrHPV) testing alone, or every 5 years with hrHPV testing in combination with cytology (cotesting).   Osteoporosis Screening -  The USPSTF recommends screening for osteoporosis with bone measurement testing to prevent osteoporotic fractures in women 65 years and older.  Hepatitis C Screening - Recommend screening for hepatitis C virus (HCV) infection in all adults aged 59 to 80 years.  Lung cancer Screening - Recommend annual screening for lung cancer with low-dose computed tomography (LDCT) in adults ages 27 to 14 years who have a 20 pack-year smoking history and currently smoke or have quit within the past 15 years.  Recommended Vaccinations   Influenza one dose annually   Tetanus/diphtheria one booster every 10 years   Zoster/Shingles -  Shingrix two doses after age 86 (second dose given 2-6 months after first dose)  Pneumovax (PPSV23) one dose for adults aged ?65 years  Prevnar(PCV13) shared clinical decision-making is recommended regarding administration of this vaccine to persons aged ?65 years who do not have an immunocompromising condition, cerebrospinal fluid leak, or cochlear implant.     PERSONAL PREVENTION PLAN   Your Personal Prevention Plan is based on your overall health and your responses to the health questionnaire you completed. The following information is for you to review in addition to the recommendations, referrals, and tests we have discussed at your visit.     Physical Activity:   Physical activity can help you maintain a healthy weight, prevent or control illness, reduce stress, and sleep better. It can also help you improve your balance to avoid falls. Try to build up to and maintain a total of 30 minutes of activity each day. If you are able, try walking, doing yard or housework, and taking the stairs more often. You can also strengthen your muscles with exercises done while sitting or lying down.   Emotional Health:   Feeling "down in the dumps" or anxious every now and then is a natural part of life. If this feeling lasts for a few weeks or more, talk with me as soon as possible. It could be a sign of a problem that needs treatment. There are many types of treatment available.   Falls:   You can reduce your risk of falling by making changes in your home. Remove items that may cause tripping, improve lighting, and consider installing grab bars.   Talk with me if you have problems with balance and walking. To prevent falls, you may need your vision, hearing, or blood pressure checked. Exercises to improve your strength and balance, or using a cane or walker, may help. Review your medicines with me at every visit, because some can affect balance. Please be sure to let me know if you fall or are fearful you may fall.   Urinary  Leakage:   Urine leakage is common, but it is not a normal part of aging. Talk with me about any urine leakage so that the cause can be found and treated. Treatment can include bladder training, exercises, medicine or surgery.   Pain:   We all have aches and pains at times, but chronic pain can change how you feel and live every day. Please talk with me about any symptoms of chronic pain so that we can determine how best to treat.   Sleep:   Getting a good night's sleep is vital to your health and well-being and can help prevent or manage health problems. Often, sleep can be improved by changing behaviors, including when you go to bed and what you do before bed. Sleep apnea can cause problems such as struggling to stay awake during the day. Please let me know if you would like to learn more about improving your sleep and/or think you may have sleep apnea.  Seat Belt:   Please remember to wear a seat belt when driving or riding in a vehicle. It is one of the most important things you can do to stay safe in a car.   Nutrition:   Remember to eat plenty of fruits, vegetables, whole grains, and dairy. Drink at least 64 ounces (8 full glasses) of water a day, unless you have been advised to limit fluids.   Alcohol:   Alcohol can have a greater effect on older people, who may feel its effects at a lower amount. Older people should limit alcoholic drinks (no more than one a day for women and no more than two a day for men). Please let me know if alcohol use becomes a problem.   Tobacco:   Not smoking or using other forms of tobacco is one of the most important things you can do for your health. Here is some more information about the importance of quitting smoking and how to quit smoking - [employer]  Advance Directives:   There may come a time when medical decisions need to be made on your behalf. Please talk with your family, and with me, about your wishes. It is important to  provide information about your decisions, and any formal advance directives, for your medical record. Here is additional information on advanced directives - BlindCheck.com.ee.html  Additional Support:   Sometimes it can be challenging to manage all aspects of daily life. Finding the right support can help you maintain or improve your health and independence. Please let me know if you would like to talk further about finding resources to assist you.

## 2022-05-31 ENCOUNTER — Ambulatory Visit (INDEPENDENT_AMBULATORY_CARE_PROVIDER_SITE_OTHER): Payer: Medicare Other | Admitting: Family Medicine

## 2022-06-04 ENCOUNTER — Other Ambulatory Visit (INDEPENDENT_AMBULATORY_CARE_PROVIDER_SITE_OTHER): Payer: Self-pay | Admitting: Family Medicine

## 2022-06-04 DIAGNOSIS — I10 Essential (primary) hypertension: Secondary | ICD-10-CM

## 2022-06-04 DIAGNOSIS — Z1231 Encounter for screening mammogram for malignant neoplasm of breast: Secondary | ICD-10-CM | POA: Diagnosis not present

## 2022-06-04 DIAGNOSIS — Z6827 Body mass index (BMI) 27.0-27.9, adult: Secondary | ICD-10-CM | POA: Diagnosis not present

## 2022-06-04 DIAGNOSIS — Z01419 Encounter for gynecological examination (general) (routine) without abnormal findings: Secondary | ICD-10-CM | POA: Diagnosis not present

## 2022-06-05 ENCOUNTER — Other Ambulatory Visit (INDEPENDENT_AMBULATORY_CARE_PROVIDER_SITE_OTHER): Payer: Self-pay | Admitting: Family

## 2022-06-08 ENCOUNTER — Other Ambulatory Visit (INDEPENDENT_AMBULATORY_CARE_PROVIDER_SITE_OTHER): Payer: Self-pay | Admitting: Endocrinology, Diabetes and Metabolism

## 2022-06-09 ENCOUNTER — Other Ambulatory Visit (INDEPENDENT_AMBULATORY_CARE_PROVIDER_SITE_OTHER): Payer: Self-pay | Admitting: Family

## 2022-06-09 DIAGNOSIS — J4541 Moderate persistent asthma with (acute) exacerbation: Secondary | ICD-10-CM

## 2022-06-13 ENCOUNTER — Encounter: Payer: Self-pay | Admitting: Family Medicine

## 2022-06-13 ENCOUNTER — Encounter (INDEPENDENT_AMBULATORY_CARE_PROVIDER_SITE_OTHER): Payer: Self-pay

## 2022-06-13 ENCOUNTER — Ambulatory Visit (INDEPENDENT_AMBULATORY_CARE_PROVIDER_SITE_OTHER): Payer: 59

## 2022-06-13 DIAGNOSIS — Z23 Encounter for immunization: Secondary | ICD-10-CM

## 2022-06-13 NOTE — Progress Notes (Signed)
Vaccination administration:  Patient presented to the office for the following vaccination administration:    Influenza given in the Left deltoid    No reactions were noted and patient left in good condition.

## 2022-06-14 ENCOUNTER — Other Ambulatory Visit: Payer: Self-pay | Admitting: Family Medicine

## 2022-06-14 ENCOUNTER — Ambulatory Visit (INDEPENDENT_AMBULATORY_CARE_PROVIDER_SITE_OTHER): Payer: 59 | Admitting: Family Medicine

## 2022-06-14 ENCOUNTER — Encounter (INDEPENDENT_AMBULATORY_CARE_PROVIDER_SITE_OTHER): Payer: Self-pay | Admitting: Family Medicine

## 2022-06-14 ENCOUNTER — Encounter (INDEPENDENT_AMBULATORY_CARE_PROVIDER_SITE_OTHER): Payer: 59 | Admitting: Family Medicine

## 2022-06-14 ENCOUNTER — Ambulatory Visit (INDEPENDENT_AMBULATORY_CARE_PROVIDER_SITE_OTHER): Payer: 59

## 2022-06-14 VITALS — Ht 61.71 in | Wt 159.0 lb

## 2022-06-14 VITALS — BP 115/68 | HR 76 | Temp 97.9°F

## 2022-06-14 DIAGNOSIS — D649 Anemia, unspecified: Secondary | ICD-10-CM

## 2022-06-14 DIAGNOSIS — M170 Bilateral primary osteoarthritis of knee: Secondary | ICD-10-CM

## 2022-06-14 DIAGNOSIS — R195 Other fecal abnormalities: Secondary | ICD-10-CM

## 2022-06-14 DIAGNOSIS — K640 First degree hemorrhoids: Secondary | ICD-10-CM

## 2022-06-14 DIAGNOSIS — M25522 Pain in left elbow: Secondary | ICD-10-CM

## 2022-06-14 DIAGNOSIS — M25562 Pain in left knee: Secondary | ICD-10-CM

## 2022-06-14 DIAGNOSIS — M25561 Pain in right knee: Secondary | ICD-10-CM

## 2022-06-14 MED ORDER — CEPHALEXIN 500 MG PO CAPS: 500.0000 mg | ORAL_CAPSULE | Freq: Three times a day (TID) | ORAL | 0 refills | Status: DC

## 2022-06-14 NOTE — Progress Notes (Signed)
Have you seen any specialists/other providers since your last visit with [place]?    Yes      The patient was informed that the following HM items are still outstanding:   Health Maintenance Due   Topic Date Due    Advance Directive on File  Never done    Shingrix Vaccine 50+ (1) Never done    COVID-19 Vaccine (4 - Moderna series) 09/28/2020

## 2022-06-14 NOTE — Progress Notes (Signed)
[facility]                  Date of Exam: 06/14/2022 12:51 PM      Patient ID: Helen GiovanniRegina A Billig is a 76 y.o. female.  Attending Physician: Hanley Benaniel B Beila Purdie II, DO      Chief Complaint:   Chief Complaint   Patient presents with    Knee Problem     Bilat knee pain             HPI:   HPI  Comes in today for evaluation of b/l knee pain.  This has been a chronic issue and she has seen Dr. Jacqulyn BathHallal previously.  She was last seen by him for this several years ago at which time bilateral knee cortisone injections were provided which provided around 1 months of pain relief. Has completed PT in the past       ROS:   As per HPI.  Otherwise as below.  Review of Systems   Constitutional:  Negative for activity change and chills.   HENT:  Negative for congestion.    Respiratory:  Negative for chest tightness and shortness of breath.    Cardiovascular:  Negative for chest pain and palpitations.   Gastrointestinal:  Negative for abdominal pain and constipation.   Genitourinary:  Negative for difficulty urinating and dysuria.           Problem List:   Patient Active Problem List   Diagnosis    Hypertension    Hyperlipidemia    H/O total hysterectomy with removal of both tubes and ovaries    Acute viral syndrome    Heart block AV first degree    RBBB (right bundle branch block)    Palpitations    Pure hypercholesterolemia, unspecified    Nonspecific abnormal electrocardiogram (ECG) (EKG)    Type 2 diabetes mellitus without complication, without long-term current use of insulin    Type 2 diabetes mellitus with hyperglycemia    Anemia        Current Meds:   Outpatient Medications Marked as Taking for the 06/14/22 encounter (Office Visit) with Hanley Beniller, Jasleen Riepe B II, DO   Medication Sig Dispense Refill    albuterol (PROVENTIL) (2.5 MG/3ML) 0.083% nebulizer solution Take 3 mLs (2.5 mg total) by nebulization every 4 (four) hours as needed for Shortness of Breath or Wheezing 75 mL 1    albuterol sulfate  HFA (PROVENTIL) 108 (90 Base) MCG/ACT inhaler INHALE 2 PUFFS INTO THE LUNGS EVERY 4 HOURS AS NEEDED FOR WHEEZING OR SHORTNESS OF BREATH. 6.7 each 2    amLODIPine (NORVASC) 10 MG tablet TAKE 1 TABLET (10 MG) BY MOUTH DAILY. 90 tablet 3    atorvastatin (LIPITOR) 40 MG tablet Take 1 tablet (40 mg) by mouth daily 90 tablet 1    Blood Glucose Monitoring Suppl (ONE TOUCH ULTRA 2) W/DEVICE KIT One daily 1 each 0    Cholecalciferol (Vitamin D3) 2000 UNIT capsule Take by mouth daily      desonide (DESOWEN) 0.05 % cream       dexAMETHasone (DECADRON) 0.1 % ophthalmic solution       diclofenac Sodium (VOLTAREN) 1 % Gel topical gel Apply 2 g topically 4 (four) times daily 50 g 0    diphenoxylate-atropine (LOMOTIL) 2.5-0.025 MG per tablet       ferrous sulfate 325 (65 FE) MG tablet ferrous sulfate 325 mg (65 mg iron) tablet      fluocinonide (LIDEX) 0.05 %  gel       fluticasone (FLONASE) 50 MCG/ACT nasal spray       fluticasone (Flovent HFA) 220 MCG/ACT inhaler INHALE 1 PUFF INTO THE LUNGS TWICE A DAY 3 each 1    glipiZIDE (GLUCOTROL) 10 MG tablet TAKE 2 TABLETS (20 MG) BY MOUTH 2 (TWO) TIMES DAILY BEFORE MEALS 360 tablet 0    glucose blood (OneTouch Ultra) test strip 2 times daily 200 each 3    Jardiance 25 MG tablet TAKE 1 TABLET BY MOUTH EVERY MORNING 30 tablet 3    Lancets (onetouch ultrasoft) lancets Use to check blood sugars 2 times daily 200 each 3    lidocaine (Lidoderm) 5 % Place 1 patch onto the skin daily as needed (pain) Remove & Discard patch within 12 hours 30 patch 0    losartan (COZAAR) 50 MG tablet       meloxicam (MOBIC) 7.5 MG tablet TAKE 1 TABLET BY MOUTH EVERY DAY 90 tablet 0    metFORMIN (GLUCOPHAGE) 1000 MG tablet TAKE 1 TABLET BY MOUTH TWICE A DAY 180 tablet 0    montelukast (SINGULAIR) 10 MG tablet Take 1 tablet (10 mg) by mouth daily      NASONEX 50 MCG/ACT nasal spray as needed  5    pantoprazole (PROTONIX) 40 MG tablet TAKE 1 TABLET BY MOUTH EVERY DAY 90 tablet 1    Tradjenta 5 MG Tab TAKE 1 TABLET (5  MG TOTAL) BY MOUTH DAILY. 90 tablet 0    valsartan-hydroCHLOROthiazide (DIOVAN-HCT) 160-12.5 MG per tablet Take 1 tablet by mouth daily 90 tablet 3         Allergies:   Allergies   Allergen Reactions    Aspirin      Break out in sweat and dizziness     Peppers     Pollen Extract     Latex Itching and Rash        Past Surgical History:   Past Surgical History:   Procedure Laterality Date    ABLATION OF DYSRHYTHMIC FOCUS      ARTHROSCOPY, KNEE Left 04/21/2015    Procedure: ARTHROSCOPY, KNEE PARTIAL MEDIAL AND LATERAL MENISECTOMY LEFT KNEE;  Surgeon: Bess Kinds, MD;  Location: [facility];  Service: Orthopedics;  Laterality: Left;  partial medial menisectomy    BREAST BIOPSY      HYSTERECTOMY      total    TUMOR REMOVAL Left     neck  benign           Family History:   Family History   Problem Relation Age of Onset    Diabetes Mother     Diabetes Sister     Diabetes Sister     Malignant hyperthermia Neg Hx     Pseudochol deficiency Neg Hx     Anesthesia problems Neg Hx           Social History:   Social History     Tobacco Use    Smoking status: Never    Smokeless tobacco: Never   Vaping Use    Vaping Use: Never used   Substance Use Topics    Alcohol use: No    Drug use: Never          The following sections were reviewed this encounter by the provider:             Vital Signs:   Ht 1.567 m (5' 1.71")   Wt 72.1 kg (159 lb)   BMI 29.36 kg/m  Physical Exam:   Physical Exam  Vitals and nursing note reviewed.   Constitutional:       General: She is not in acute distress.     Appearance: Normal appearance. She is not ill-appearing.   HENT:      Head: Normocephalic and atraumatic.      Right Ear: External ear normal.      Left Ear: External ear normal.      Nose: Nose normal. No congestion.      Mouth/Throat:      Mouth: Mucous membranes are moist.   Eyes:      Pupils: Pupils are equal, round, and reactive to light.   Musculoskeletal:      Comments: Examination of the knee reveals full AROM with crepitus.   No effusion.  Medial and lateral joint line tenderness present   Neurological:      General: No focal deficit present.      Mental Status: She is alert and oriented to person, place, and time. Mental status is at baseline.   Psychiatric:         Mood and Affect: Mood normal.               Procedure(s):   Procedures        Radiology:   X-rays obtained today and reviewed.  3 views of the b/l knees.  Severe knee OA         Assessment:   1. Pain in both knees, unspecified chronicity  - XR Knee 4+ Views Right    2. Left elbow pain  - Referral to [facility] ([place])    3. Primary osteoarthritis of both knees           Plan:   1. Pain in both knees, unspecified chronicity    2. Left elbow pain    3. Primary osteoarthritis of both knees       Treatment options discussed with patient at length, including risks, benefits, alternatives, and the nature of any potential procedures for the problem. As she has had only limited relief with cortisone injections in the past I recommended that we try HA injections for pain relief.  Will work on getting them approved and contact the patient once they come in           Follow-up:   No follow-ups on file.          Lonell Face II, DO

## 2022-06-14 NOTE — Progress Notes (Signed)
Subjective:      Patient ID: Helen [name] is a 76 y.o. female     Chief Complaint   Patient presents with    Stool Color Change     Black and bloody         HPI   This is a patient who is concerned because she noted that her stools were very dark compared to normal and then on alternate days would be perfectly fine normal brown-colored.  She does have a history of hemorrhoids and occasionally she does notice some bright red blood on the stool but this did not worry her.  It was the darker stools that she was worried.  She has not had any abdominal pain associated with it.  She is not experiencing any lightheadedness or dizziness.  The patient was up-to-date on her colonoscopy.   The following sections were reviewed this encounter by the provider:   Tobacco  Allergies  Meds  Problems  Med Hx  Surg Hx  Fam Hx         Review of Systems   Constitutional: Negative.    Respiratory: Negative.     Cardiovascular: Negative.    Gastrointestinal:  Positive for anal bleeding and blood in stool. Negative for abdominal distention, abdominal pain and constipation.   Genitourinary: Negative.           BP 115/68   Pulse 76   Temp 97.9 F (36.6 C) (Temporal)   SpO2 97%     Objective:     Physical Exam  Vitals reviewed.   Constitutional:       Appearance: Normal appearance.   HENT:      Head: Normocephalic and atraumatic.   Cardiovascular:      Rate and Rhythm: Normal rate and regular rhythm.      Pulses: Normal pulses.      Heart sounds: Normal heart sounds.   Pulmonary:      Breath sounds: Normal breath sounds.   Abdominal:      General: Abdomen is flat. Bowel sounds are normal. There is no distension.      Palpations: Abdomen is soft.      Tenderness: There is no abdominal tenderness. There is no guarding or rebound.      Hernia: No hernia is present.   Musculoskeletal:      Cervical back: Normal range of motion.   Neurological:      General: No focal deficit present.      Mental Status: She is alert.   Psychiatric:          Mood and Affect: Mood normal.       Anoscopy was performed with the patient permission and a chaperone present.  Small internal hemorrhoids were present  .  Guaiac test was negative.  Stool was a dark brown.     Assessment:     1. Dark stools  - CBC and differential  - POCT occult blood stool    2. Grade I hemorrhoids  - CBC and differential    3. Anemia, unspecified type        Plan:     Advised patient we will check her labs see if there is any concerns but at this point with negative guaiac, her dark stools which she is worried about do not appear to be secondary to blood.    Renae Fickle., MD

## 2022-06-15 ENCOUNTER — Ambulatory Visit (INDEPENDENT_AMBULATORY_CARE_PROVIDER_SITE_OTHER): Payer: Medicare Other | Admitting: Family Medicine

## 2022-06-15 VITALS — BP 130/70 | HR 71 | Ht 66.5 in | Wt 171.8 lb

## 2022-06-15 DIAGNOSIS — N1831 Chronic kidney disease, stage 3a: Secondary | ICD-10-CM

## 2022-06-15 DIAGNOSIS — I10 Essential (primary) hypertension: Secondary | ICD-10-CM

## 2022-06-15 DIAGNOSIS — Z23 Encounter for immunization: Secondary | ICD-10-CM | POA: Diagnosis not present

## 2022-06-15 LAB — CBC AND DIFFERENTIAL
Baso(Absolute): 0.1 10*3/uL (ref 0.0–0.2)
Basophils Automated: 1 %
Eosinophils Absolute: 0.1 10*3/uL (ref 0.0–0.4)
Eosinophils Automated: 1 %
Hematocrit: 35.9 % (ref 34.0–46.6)
Hemoglobin: 11.4 g/dL (ref 11.1–15.9)
Immature Granulocytes Absolute: 0 10*3/uL (ref 0.0–0.1)
Immature Granulocytes: 1 %
Lymphocytes Absolute: 2.3 10*3/uL (ref 0.7–3.1)
Lymphocytes Automated: 31 %
MCH: 23.1 pg — ABNORMAL LOW (ref 26.6–33.0)
MCHC: 31.8 g/dL (ref 31.5–35.7)
MCV: 73 fL — ABNORMAL LOW (ref 79–97)
Monocytes Absolute: 0.7 10*3/uL (ref 0.1–0.9)
Monocytes: 10 %
Neutrophils Absolute Count: 4.1 10*3/uL (ref 1.4–7.0)
Neutrophils: 56 %
Platelets: 272 10*3/uL (ref 150–450)
RBC: 4.93 x10E6/uL (ref 3.77–5.28)
RDW: 13.3 % (ref 11.7–15.4)
WBC: 7.3 10*3/uL (ref 3.4–10.8)

## 2022-06-15 MED ORDER — CEPHALEXIN 500 MG PO CAPS: 500.0000 mg | ORAL_CAPSULE | Freq: Three times a day (TID) | ORAL | 0 refills | Status: DC

## 2022-06-15 NOTE — Progress Notes (Signed)
Subjective:    Patient ID: April Beck, female    DOB: Mar 08, 1946, 76 y.o.   MRN: [id number]  Patient has a history of stage III chronic kidney disease.  At her last visit, her creatinine had risen to greater than 1.4 and her GFR was down to 39.  Therefore I recommended discontinuation of hydrochlorothiazide, avoiding all NSAIDs, and replacing with losartan.  Her blood pressure today is excellent at 130/70.  She denies any chest pain shortness of breath or dyspnea on exertion.  She is currently on Keflex for urinary tract infection.    Past Medical History:  Diagnosis Date   Anxiety    hx of panic attack   Arthritis    hands & knees   CKD (chronic kidney disease), stage III ([place])    followed by pcp   Closed fracture of left distal radius 10/31/2021   Edema of both lower extremities    GERD (gastroesophageal reflux disease)    History of 2019 novel coronavirus disease (COVID-19) 09/17/2020   positive result in epic,  per pt very mild symtpoms that resolved   History of uterine fibroid    Hypertension    followed by pcp   Hypomagnesemia    Hypothyroidism    followed by pcp   IDA (iron deficiency anemia)    Osteopenia    Ovarian cyst    Pelvic pain    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, takes meloxicam   Wears contact lenses    Past Surgical History:  Procedure Laterality Date   [place]  06/20/2012   Procedure: LAPAROSCOPIC CHOLECYSTECTOMY;  Surgeon: Zenovia Jarred, MD;  Location: [facility];  Service: General;  Laterality: N/A;   COLONOSCOPY  last one 03-20-2019  dr Ardis Hughs   DIAGNOSTIC LAPAROSCOPY  yrs ago   HIP ARTHROPLASTY Right 11/13/2020   Procedure: ARTHROPLASTY BIPOLAR HIP (HEMIARTHROPLASTY);  Surgeon: Carole Civil, MD;  Location: [facility];  Service: Orthopedics;  Laterality: Right;   INCONTINENCE SURGERY  09-11-2005   dr Jeffie Pollock  '@[email or web address]'$    LYNX SLING   LAPAROSCOPIC SALPINGO OOPHERECTOMY Bilateral  01/16/2021   Procedure: DIAGNOSTIC LAPAROSCOPY;  Surgeon: Molli Posey, MD;  Location: [facility];  Service: Gynecology;  Laterality: Bilateral;   LAPAROSCOPY Bilateral 01/16/2021   Procedure: EXPLORATORY LAPAROTOMY BILATERAL SALPINGO OOPHORECTOMY;  Surgeon: Molli Posey, MD;  Location: [facility];  Service: Gynecology;  Laterality: Bilateral;   TUBAL LIGATION     Current Outpatient Medications on File Prior to Visit  Medication Sig Dispense Refill   acetaminophen (TYLENOL) 325 MG tablet Take 2 tablets (650 mg total) by mouth every 6 (six) hours as needed for pain.     acyclovir (ZOVIRAX) 400 MG tablet TAKE 1 TABLET EVERY DAY 90 tablet 3   atorvastatin (LIPITOR) 40 MG tablet TAKE 1 TABLET EVERY DAY 90 tablet 3   calcium-vitamin D (OSCAL WITH D) 250-125 MG-UNIT tablet Take 1 tablet by mouth daily.     cephALEXin (KEFLEX) 500 MG capsule Take 1 capsule (500 mg total) by mouth 3 (three) times daily. 21 capsule 0   citalopram (CELEXA) 10 MG tablet TAKE 1 TABLET EVERY DAY 90 tablet 3   diphenoxylate-atropine (LOMOTIL) 2.5-0.025 MG tablet Take 1 tablet by mouth 4 (four) times daily as needed for diarrhea or loose stools. 30 tablet 0   fluocinonide gel (LIDEX) 0.86 % Apply 1 application topically 3 (three) times daily as needed (rash).  levothyroxine (SYNTHROID) 50 MCG tablet TAKE 1 TABLET EVERY DAY 90 tablet 0   losartan (COZAAR) 50 MG tablet Take 1 tablet (50 mg total) by mouth daily. 90 tablet 3   pantoprazole (PROTONIX) 40 MG tablet TAKE 1 TABLET TWICE DAILY 180 tablet 0   No current facility-administered medications on file prior to visit.   No Known Allergies Social History   Socioeconomic History   Marital status: Divorced    Spouse name: Not on file   Number of children: Not on file   Years of education: Not on file   Highest education level: Not on file  Occupational History   Not on file  Tobacco Use   Smoking status: Never   Smokeless  tobacco: Never  Vaping Use   Vaping Use: Never used  Substance and Sexual Activity   Alcohol use: No   Drug use: Never   Sexual activity: Not on file  Other Topics Concern   Not on file  Social History Narrative   Not on file   Social Determinants of Health   Financial Resource Strain: Low Risk  (02/23/2021)   Overall Financial Resource Strain (CARDIA)    Difficulty of Paying Living Expenses: Not hard at all  Food Insecurity: No Food Insecurity (01/15/2022)   Hunger Vital Sign    Worried About Running Out of Food in the Last Year: Never true    [place] in the Last Year: Never true  Transportation Needs: No Transportation Needs (01/15/2022)   PRAPARE - Hydrologist (Medical): No    Lack of Transportation (Non-Medical): No  Physical Activity: Insufficiently Active (02/23/2021)   Exercise Vital Sign    Days of Exercise per Week: 7 days    Minutes of Exercise per Session: 20 min  Stress: No Stress Concern Present (02/23/2021)   [place]    Feeling of Stress : Not at all  Social Connections: Moderately Integrated (02/23/2021)   Social Connection and Isolation Panel [NHANES]    Frequency of Communication with Friends and Family: More than three times a week    Frequency of Social Gatherings with Friends and Family: More than three times a week    Attends Religious Services: More than 4 times per year    Active Member of [employer] or Organizations: Yes    Attends Archivist Meetings: More than 4 times per year    Marital Status: Widowed  Intimate Partner Violence: Not At Risk (02/23/2021)   Humiliation, Afraid, Rape, and Kick questionnaire    Fear of Current or Ex-Partner: No    Emotionally Abused: No    Physically Abused: No    Sexually Abused: No       Review of Systems  All other systems reviewed and are negative.      Objective:   Physical Exam Vitals reviewed.   Constitutional:      General: She is not in acute distress.    Appearance: She is well-developed. She is not diaphoretic.  HENT:     Head: Normocephalic and atraumatic.     Right Ear: External ear normal.     Left Ear: External ear normal.     Nose: Nose normal.     Mouth/Throat:     Pharynx: No oropharyngeal exudate.  Eyes:     General: No scleral icterus.       Right eye: No discharge.  Left eye: No discharge.     Conjunctiva/sclera: Conjunctivae normal.     Pupils: Pupils are equal, round, and reactive to light.  Neck:     Thyroid: No thyromegaly.     Vascular: No JVD.     Trachea: No tracheal deviation.  Cardiovascular:     Rate and Rhythm: Normal rate and regular rhythm.     Heart sounds: Normal heart sounds. No murmur heard.    No friction rub. No gallop.  Pulmonary:     Effort: Pulmonary effort is normal. No respiratory distress.     Breath sounds: Normal breath sounds. No stridor. No wheezing or rales.  Chest:     Chest wall: No tenderness.  Abdominal:     General: Bowel sounds are normal. There is no distension.     Palpations: Abdomen is soft. There is no mass.     Tenderness: There is no abdominal tenderness. There is no guarding or rebound.  Musculoskeletal:        General: No tenderness or deformity.     Cervical back: Normal range of motion and neck supple.  Lymphadenopathy:     Cervical: No cervical adenopathy.  Skin:    General: Skin is warm.     Coloration: Skin is not pale.     Findings: No erythema or rash.  Neurological:     Mental Status: She is alert and oriented to person, place, and time.     Cranial Nerves: No cranial nerve deficit.     Motor: No abnormal muscle tone.     Coordination: Coordination normal.     Deep Tendon Reflexes: Reflexes are normal and symmetric. Reflexes normal.  Psychiatric:        Behavior: Behavior normal.        Thought Content: Thought content normal.        Judgment: Judgment normal.            Assessment & Plan:  Stage 3a chronic kidney disease ([place]) - Plan: BASIC METABOLIC PANEL WITH GFR, Protein / Creatinine Ratio, Urine  Benign essential HTN - Plan: BASIC METABOLIC PANEL WITH GFR, Protein / Creatinine Ratio, Urine I am happy with her blood pressure.  I will check a BMP and a urine protein creatinine ratio.  If her creatinine is stable and her urine protein creatinine ratio is less than 200 I will make no additional changes.  If the protein to creatinine ratio is greater than 200, I will discuss with her starting Farxiga in addition to the losartan.  She received her flu shot today.  I recommended a COVID booster.  She also has a new grandchild so I recommended RSV shot.

## 2022-06-16 LAB — BASIC METABOLIC PANEL WITH GFR
BUN: 15 mg/dL (ref 7–25)
CO2: 30 mmol/L (ref 20–32)
Calcium: 9.6 mg/dL (ref 8.6–10.4)
Chloride: 104 mmol/L (ref 98–110)
Creat: 0.92 mg/dL (ref 0.60–1.00)
Glucose, Bld: 90 mg/dL (ref 65–99)
Potassium: 4.5 mmol/L (ref 3.5–5.3)
Sodium: 142 mmol/L (ref 135–146)
eGFR: 65 mL/min/{1.73_m2} (ref >=60)

## 2022-06-16 LAB — PROTEIN / CREATININE RATIO, URINE
Creatinine, Urine: 192 mg/dL (ref 20–275)
Protein/Creat Ratio: 161 mg/g creat (ref 24–184)
Protein/Creatinine Ratio: 0.161 mg/mg creat (ref 0.024–0.184)
Total Protein, Urine: 31 mg/dL — ABNORMAL HIGH (ref 5–24)

## 2022-06-21 ENCOUNTER — Other Ambulatory Visit: Payer: Self-pay | Admitting: Family Medicine

## 2022-06-21 NOTE — Telephone Encounter (Signed)
Requested Prescriptions  Pending Prescriptions Disp Refills  . levothyroxine (SYNTHROID) 50 MCG tablet [Pharmacy Med Name: LEVOTHYROXINE SODIUM 50 MCG Tablet] 90 tablet 2    Sig: TAKE 1 TABLET EVERY DAY     Endocrinology:  Hypothyroid Agents Passed - 06/21/2022  4:28 AM      Passed - TSH in normal range and within 360 days    TSH  Date Value Ref Range Status  03/15/2022 2.04 0.40 - 4.50 mIU/L Final         Passed - Valid encounter within last 12 months    Recent Outpatient Visits          8 months ago [place] Pickard, Cammie Mcgee, MD   1 year ago [place] Susy Frizzle, MD   1 year ago Benign essential HTN   [place] Susy Frizzle, MD   1 year ago Closed fracture of right hip with routine healing, subsequent encounter   [place] Susy Frizzle, MD   1 year ago Complex cyst of left ovary   [place] Pickard, Cammie Mcgee, MD      Future Appointments            In 5 months Pickard, Cammie Mcgee, MD [place], [facility]           . pantoprazole (PROTONIX) 40 MG tablet [Pharmacy Med Name: PANTOPRAZOLE SODIUM 40 MG Tablet Delayed Release] 180 tablet 2    Sig: TAKE 1 TABLET TWICE DAILY     Gastroenterology: Proton Pump Inhibitors Passed - 06/21/2022  4:28 AM      Passed - Valid encounter within last 12 months    Recent Outpatient Visits          8 months ago [place]   [place] Susy Frizzle, MD   1 year ago [place] Susy Frizzle, MD   1 year ago Benign essential HTN   [place] Susy Frizzle, MD   1 year ago Closed fracture of right hip with routine healing, subsequent encounter   [place] Susy Frizzle, MD   1 year ago Complex cyst of left ovary   [place] Pickard, Cammie Mcgee, MD      Future Appointments             In 5 months Pickard, Cammie Mcgee, MD [place]

## 2022-06-25 ENCOUNTER — Encounter: Payer: Self-pay | Admitting: Family Medicine

## 2022-06-27 ENCOUNTER — Other Ambulatory Visit: Payer: Self-pay | Admitting: Family Medicine

## 2022-06-27 NOTE — Telephone Encounter (Signed)
Requested Prescriptions  Pending Prescriptions Disp Refills  . acyclovir (ZOVIRAX) 400 MG tablet [Pharmacy Med Name: ACYCLOVIR 400 MG Tablet] 90 tablet 3    Sig: TAKE 1 TABLET EVERY DAY     Antimicrobials:  Antiviral Agents - Anti-Herpetic Passed - 06/27/2022  3:36 AM      Passed - Valid encounter within last 12 months    Recent Outpatient Visits          8 months ago [place] Pickard, Cammie Mcgee, MD   1 year ago [place] Dennard Schaumann, Cammie Mcgee, MD   1 year ago Benign essential HTN   [place] Dennard Schaumann, Cammie Mcgee, MD   1 year ago Closed fracture of right hip with routine healing, subsequent encounter   [place] Dennard Schaumann Cammie Mcgee, MD   1 year ago Complex cyst of left ovary   [place] Pickard, Cammie Mcgee, MD      Future Appointments            In 5 months Pickard, Cammie Mcgee, MD [place], [facility]           . citalopram (CELEXA) 10 MG tablet [Pharmacy Med Name: CITALOPRAM HYDROBROMIDE 10 MG Tablet] 90 tablet 3    Sig: TAKE 1 TABLET EVERY DAY     Psychiatry:  Antidepressants - SSRI Failed - 06/27/2022  3:36 AM      Failed - Valid encounter within last 6 months    Recent Outpatient Visits          8 months ago [place] Susy Frizzle, MD   1 year ago [place] Susy Frizzle, MD   1 year ago Benign essential HTN   [place] Susy Frizzle, MD   1 year ago Closed fracture of right hip with routine healing, subsequent encounter   [place] Susy Frizzle, MD   1 year ago Complex cyst of left ovary   [place] Pickard, Cammie Mcgee, MD      Future Appointments            In 5 months Pickard, Cammie Mcgee, MD [place], [facility]           . atorvastatin (LIPITOR) 40 MG tablet [Pharmacy Med Name: ATORVASTATIN CALCIUM  40 MG Tablet] 90 tablet 3    Sig: TAKE 1 TABLET EVERY DAY     Cardiovascular:  Antilipid - Statins Failed - 06/27/2022  3:36 AM      Failed - Lipid Panel in normal range within the last 12 months    Cholesterol  Date Value Ref Range Status  03/15/2022 152 <200 mg/dL Final   LDL Cholesterol (Calc)  Date Value Ref Range Status  03/15/2022 75 mg/dL (calc) Final    Comment:    Reference range: <100 . Desirable range <100 mg/dL for primary prevention;   <70 mg/dL for patients with CHD or diabetic patients  with > or = 2 CHD risk factors. [name] LDL-C is now calculated using the Martin-Hopkins  calculation, which is a validated novel method providing  better accuracy than the Friedewald equation in the  estimation of LDL-C.  Cresenciano Genre et al. Annamaria Helling. 9211;941(74): 2061-2068  (http://education.[employer].com/faq/FAQ164)    HDL  Date Value Ref Range Status  03/15/2022 57 > OR = 50 mg/dL  Final   Triglycerides  Date Value Ref Range Status  03/15/2022 112 <150 mg/dL Final         Passed - Patient is not pregnant      Passed - Valid encounter within last 12 months    Recent Outpatient Visits          8 months ago [place]   [place] Pickard, Cammie Mcgee, MD   1 year ago [place] Dennard Schaumann Cammie Mcgee, MD   1 year ago Benign essential HTN   [place] Susy Frizzle, MD   1 year ago Closed fracture of right hip with routine healing, subsequent encounter   [place] Susy Frizzle, MD   1 year ago Complex cyst of left ovary   [place] Pickard, Cammie Mcgee, MD      Future Appointments            In 5 months Pickard, Cammie Mcgee, MD [place]

## 2022-07-02 ENCOUNTER — Telehealth: Payer: Self-pay | Admitting: Family Medicine

## 2022-07-02 NOTE — Telephone Encounter (Signed)
Patient completed her portion. Form placed in green folder in Dr. Samella Parr office.

## 2022-07-02 NOTE — Telephone Encounter (Signed)
Patient dropped off disability parking placard for completion. Form placed on desk of nurse.  Please advise patient at [phone] when form completed and ready for pickup.

## 2022-07-04 ENCOUNTER — Encounter: Payer: Self-pay | Admitting: Family Medicine

## 2022-07-06 NOTE — Telephone Encounter (Signed)
Patient called to check on status of form. Advised patient provider has 7 days. Patient asked if provider would put a rush on it.  Please advise at [phone].

## 2022-07-08 ENCOUNTER — Other Ambulatory Visit (INDEPENDENT_AMBULATORY_CARE_PROVIDER_SITE_OTHER): Payer: Self-pay | Admitting: Internal Medicine

## 2022-07-08 DIAGNOSIS — E119 Type 2 diabetes mellitus without complications: Secondary | ICD-10-CM

## 2022-07-09 ENCOUNTER — Encounter (HOSPITAL_BASED_OUTPATIENT_CLINIC_OR_DEPARTMENT_OTHER): Payer: Self-pay

## 2022-07-09 ENCOUNTER — Other Ambulatory Visit (INDEPENDENT_AMBULATORY_CARE_PROVIDER_SITE_OTHER): Payer: Self-pay | Admitting: Internal Medicine

## 2022-07-09 DIAGNOSIS — D2272 Melanocytic nevi of left lower limb, including hip: Secondary | ICD-10-CM | POA: Diagnosis not present

## 2022-07-09 DIAGNOSIS — D225 Melanocytic nevi of trunk: Secondary | ICD-10-CM | POA: Diagnosis not present

## 2022-07-09 DIAGNOSIS — Z1283 Encounter for screening for malignant neoplasm of skin: Secondary | ICD-10-CM | POA: Diagnosis not present

## 2022-07-09 DIAGNOSIS — D485 Neoplasm of uncertain behavior of skin: Secondary | ICD-10-CM | POA: Diagnosis not present

## 2022-07-09 DIAGNOSIS — X32XXXD Exposure to sunlight, subsequent encounter: Secondary | ICD-10-CM | POA: Diagnosis not present

## 2022-07-09 DIAGNOSIS — L570 Actinic keratosis: Secondary | ICD-10-CM | POA: Diagnosis not present

## 2022-07-17 ENCOUNTER — Encounter (INDEPENDENT_AMBULATORY_CARE_PROVIDER_SITE_OTHER): Payer: Self-pay | Admitting: Family

## 2022-07-17 ENCOUNTER — Ambulatory Visit (INDEPENDENT_AMBULATORY_CARE_PROVIDER_SITE_OTHER): Payer: 59 | Admitting: Family

## 2022-07-17 VITALS — BP 144/74 | HR 79 | Temp 97.7°F | Wt 159.0 lb

## 2022-07-17 DIAGNOSIS — J4541 Moderate persistent asthma with (acute) exacerbation: Secondary | ICD-10-CM

## 2022-07-17 DIAGNOSIS — J069 Acute upper respiratory infection, unspecified: Secondary | ICD-10-CM

## 2022-07-17 MED ORDER — AZITHROMYCIN 250 MG PO TABS
ORAL_TABLET | ORAL | 0 refills | Status: AC
Start: 2022-07-17 — End: 2022-07-22

## 2022-07-17 MED ORDER — BENZONATATE 200 MG PO CAPS
200.0000 mg | ORAL_CAPSULE | Freq: Three times a day (TID) | ORAL | 0 refills | Status: DC | PRN
Start: 2022-07-17 — End: 2022-07-19

## 2022-07-17 NOTE — Progress Notes (Signed)
Have you seen any specialists/other providers since your last visit with [place]?    No    The patient was informed that the following HM items are still outstanding:   Health Maintenance Due   Topic Date Due    Advance Directive on File  Never done    Shingrix Vaccine 50+ (1) Never done    COVID-19 Vaccine (4 - Moderna series) 09/28/2020

## 2022-07-17 NOTE — Progress Notes (Signed)
[facility]  Laurell Josephs  PROGRESS NOTE      Patient: Helen [name]   Date: 07/17/2022   MRN: [id number]     Helen [name] is a 76 y.o. female    Chief Complaint   Patient presents with   . Cough     Cough x 1 week. Pt states she was exposed to bronchitis.        MEDICATIONS     Current Outpatient Medications   Medication Sig Dispense Refill   . albuterol (PROVENTIL) (2.5 MG/3ML) 0.083% nebulizer solution Take 3 mLs (2.5 mg total) by nebulization every 4 (four) hours as needed for Shortness of Breath or Wheezing 75 mL 1   . albuterol sulfate HFA (PROVENTIL) 108 (90 Base) MCG/ACT inhaler INHALE 2 PUFFS INTO THE LUNGS EVERY 4 HOURS AS NEEDED FOR WHEEZING OR SHORTNESS OF BREATH. 6.7 each 2   . amLODIPine (NORVASC) 10 MG tablet TAKE 1 TABLET (10 MG) BY MOUTH DAILY. 90 tablet 3   . atorvastatin (LIPITOR) 40 MG tablet Take 1 tablet (40 mg) by mouth daily 90 tablet 1   . Blood Glucose Monitoring Suppl (ONE TOUCH ULTRA 2) W/DEVICE KIT One daily 1 each 0   . Cholecalciferol (Vitamin D3) 2000 UNIT capsule Take by mouth daily     . desonide (DESOWEN) 0.05 % cream      . dexAMETHasone (DECADRON) 0.1 % ophthalmic solution      . diclofenac Sodium (VOLTAREN) 1 % Gel topical gel Apply 2 g topically 4 (four) times daily 50 g 0   . diphenoxylate-atropine (LOMOTIL) 2.5-0.025 MG per tablet      . ferrous sulfate 325 (65 FE) MG tablet ferrous sulfate 325 mg (65 mg iron) tablet     . fluocinonide (LIDEX) 0.05 % gel      . fluticasone (FLONASE) 50 MCG/ACT nasal spray      . fluticasone (Flovent HFA) 220 MCG/ACT inhaler INHALE 1 PUFF INTO THE LUNGS TWICE A DAY 3 each 1   . glipiZIDE (GLUCOTROL) 10 MG tablet TAKE 2 TABLETS (20 MG) BY MOUTH 2 (TWO) TIMES DAILY BEFORE MEALS 360 tablet 0   . glucose blood (OneTouch Ultra) test strip 2 times daily 200 each 3   . Jardiance 25 MG tablet TAKE 1 TABLET BY MOUTH EVERY MORNING 30 tablet 3   . Lancets (onetouch ultrasoft) lancets Use to check blood sugars 2 times daily 200 each 3   . lidocaine  (Lidoderm) 5 % Place 1 patch onto the skin daily as needed (pain) Remove & Discard patch within 12 hours 30 patch 0   . losartan (COZAAR) 50 MG tablet      . metFORMIN (GLUCOPHAGE) 1000 MG tablet TAKE 1 TABLET BY MOUTH TWICE A DAY 180 tablet 0   . montelukast (SINGULAIR) 10 MG tablet Take 1 tablet (10 mg) by mouth daily     . pantoprazole (PROTONIX) 40 MG tablet TAKE 1 TABLET BY MOUTH EVERY DAY 90 tablet 1   . Tradjenta 5 MG Tab TAKE 1 TABLET (5 MG TOTAL) BY MOUTH DAILY. 90 tablet 0   . valsartan-hydroCHLOROthiazide (DIOVAN-HCT) 160-12.5 MG per tablet Take 1 tablet by mouth daily 90 tablet 3   . azithromycin (ZITHROMAX) 250 MG tablet Two tabs PO day 1 and then one tab PO daily days 2-5 6 tablet 0   . benzonatate (TESSALON) 200 MG capsule Take 1 capsule (200 mg) by mouth 3 (three) times daily as needed for Cough 30 capsule 0   .  meloxicam (MOBIC) 7.5 MG tablet TAKE 1 TABLET BY MOUTH EVERY DAY (Patient not taking: Reported on 07/17/2022) 90 tablet 0   . NASONEX 50 MCG/ACT nasal spray as needed (Patient not taking: Reported on 07/17/2022)  5     No current facility-administered medications for this visit.       Allergies   Allergen Reactions   . Aspirin      Break out in sweat and dizziness    . Peppers    . Pollen Extract    . Latex Itching and Rash       SUBJECTIVE     Chief Complaint   Patient presents with   . Cough     Cough x 1 week. Pt states she was exposed to bronchitis.         HPI  URI symptoms started about 10 days ago. States her cough has persisted, having a hard time sleeping at night. States her asthma has been acting up- needing to use nebulizer. States she has been wheezing more than usual    Taking mucinex    ROS     Review of Systems   Constitutional:  Positive for fatigue. Negative for chills and fever.   HENT:  Positive for congestion. Negative for ear pain, sinus pain and sore throat.    Respiratory:  Positive for cough, chest tightness, shortness of breath and wheezing.    Cardiovascular:   Negative for chest pain.   Gastrointestinal:  Negative for diarrhea, nausea and vomiting.   Neurological:  Negative for dizziness and headaches.       The following portions of the patient's history were reviewed and updated as appropriate: Allergies, Current Medications, Past Family History, Past Medical history, Past social history, Past surgical history, and Problem List.    PHYSICAL EXAM     Vitals:    07/17/22 1344   BP: 144/74   BP Site: Left arm   Patient Position: Sitting   Cuff Size: Medium   Pulse: 79   Temp: 97.7 F (36.5 C)   TempSrc: Temporal   SpO2: 95%   Weight: 72.1 kg (159 lb)       Physical Exam  Vitals and nursing note reviewed.   Constitutional:       General: She is not in acute distress.     Appearance: Normal appearance. She is well-developed.   HENT:      Head: Normocephalic and atraumatic.      Right Ear: Tympanic membrane and ear canal normal.      Left Ear: Tympanic membrane and ear canal normal.      Nose: Nose normal. No nasal tenderness, mucosal edema or rhinorrhea.      Mouth/Throat:      Lips: Pink.      Mouth: Mucous membranes are moist.      Pharynx: Oropharynx is clear. Uvula midline.   Eyes:      General: Lids are normal.      Conjunctiva/sclera: Conjunctivae normal.   Cardiovascular:      Rate and Rhythm: Normal rate and regular rhythm.      Chest Wall: PMI is not displaced.      Heart sounds: Normal heart sounds.   Pulmonary:      Effort: Pulmonary effort is normal.      Breath sounds: Normal breath sounds and air entry.   Lymphadenopathy:      Cervical: No cervical adenopathy.   Skin:     General: Skin is warm and  dry.      Capillary Refill: Capillary refill takes less than 2 seconds.   Neurological:      Mental Status: She is alert and oriented to person, place, and time.   Psychiatric:         Mood and Affect: Mood normal.         Speech: Speech normal.         Behavior: Behavior normal.           ASSESSMENT/PLAN        1. Moderate persistent asthma with acute exacerbation   azithromycin (ZITHROMAX) 250 MG tablet    benzonatate (TESSALON) 200 MG capsule      2. URI with cough and congestion          Acute. Pt at high risk for infection- hx asthma and diabetes. She does not want to start oral corticosteroid today. Will cover with antibiotics. Can continue mucinex during the day, take cough suppressant at night. Continue previously prescribed inhalers and nebs. Increase rest and fluids.     Reviewed med use and side effects. Reviewed s/s that would warrant further and/ or immediate medical attention. Pt in agreement with plan and all questions answered.    Ddx: viral URI, pharyngitis, bronchitis, sinusitis, pneumonia, allergic rhinitis, RAD, PND, influenza       Risk & Benefits of the new medication(s) were explained to the patient (and family) who verbalized understanding & agreed to the treatment plan. Patient (family) encouraged to contact me/clinical staff with any questions/concerns      Return if symptoms worsen or fail to improve.    Signed,  Myer Haff, FNP, FNP  07/17/2022

## 2022-07-18 DIAGNOSIS — D485 Neoplasm of uncertain behavior of skin: Secondary | ICD-10-CM | POA: Diagnosis not present

## 2022-07-18 DIAGNOSIS — L98499 Non-pressure chronic ulcer of skin of other sites with unspecified severity: Secondary | ICD-10-CM | POA: Diagnosis not present

## 2022-07-19 ENCOUNTER — Encounter: Payer: Self-pay | Admitting: Orthopedic Surgery

## 2022-07-19 ENCOUNTER — Telehealth: Payer: Self-pay | Admitting: Family

## 2022-07-19 DIAGNOSIS — R051 Acute cough: Secondary | ICD-10-CM

## 2022-07-19 MED ORDER — BENZONATATE 100 MG PO CAPS
100.0000 mg | ORAL_CAPSULE | Freq: Three times a day (TID) | ORAL | 0 refills | Status: DC | PRN
Start: 2022-07-19 — End: 2022-09-11

## 2022-07-19 NOTE — Telephone Encounter (Signed)
See if they cover 100mg  benzonatate. Otherwise recommend over the counter cough suppressant such as Coricidin HBP or delsym 12 hour

## 2022-07-19 NOTE — Telephone Encounter (Signed)
Pt is asking Nurse to send another medication for cough to pharmacy, because insurance does not cover benzonatate (TESSALON) 200 MG .    Please advise

## 2022-07-21 ENCOUNTER — Encounter: Payer: Self-pay | Admitting: Family Medicine

## 2022-07-24 NOTE — Telephone Encounter (Signed)
Spoke with pharmacy staff. Benzonatate (Tassalon) 100 mg is not covered under Medicare Part D, there is no option for a prior-auth, and the cash price is $13. Transferred to pharmacist and was advised that neither Coricidin HBP or delsym 12 hour are covered.

## 2022-07-24 NOTE — Telephone Encounter (Signed)
Called pt to let her know. Pt states she no longer wants to script as she cannot afford to pay $13 out of pocket.

## 2022-07-27 ENCOUNTER — Encounter (INDEPENDENT_AMBULATORY_CARE_PROVIDER_SITE_OTHER): Payer: Self-pay

## 2022-08-01 ENCOUNTER — Other Ambulatory Visit: Payer: Self-pay | Admitting: Family Medicine

## 2022-08-02 ENCOUNTER — Ambulatory Visit: Payer: Medicare Other | Admitting: Podiatry

## 2022-08-02 NOTE — Telephone Encounter (Signed)
Requested medications are due for refill today.  Provider to determine  Requested medications are on the active medications list.  yes  Last refill. 04/30/2022 #30 0 rf  Future visit scheduled.  yes  Notes to clinic.  Refill not delegated.    Requested Prescriptions  Pending Prescriptions Disp Refills   diphenoxylate-atropine (LOMOTIL) 2.5-0.025 MG tablet [Pharmacy Med Name: DIPHENOXYLATE-ATROP 2.5-0.025] 30 tablet 0    Sig: Take 1 tablet by mouth 4 (four) times daily as needed for diarrhea or loose stools.     Not Delegated - Gastroenterology:  Antidiarrheals Failed - 08/01/2022  6:18 PM      Failed - This refill cannot be delegated      Passed - Valid encounter within last 12 months    Recent Outpatient Visits           9 months ago [place] Dennard Schaumann, Cammie Mcgee, MD   1 year ago [place] Dennard Schaumann Cammie Mcgee, MD   1 year ago Benign essential HTN   [place] Susy Frizzle, MD   1 year ago Closed fracture of right hip with routine healing, subsequent encounter   [place] Susy Frizzle, MD   1 year ago Complex cyst of left ovary   [place] Pickard, Cammie Mcgee, MD       Future Appointments             In 6 days Regal, Tamala Fothergill, DPM [facility] [place], [place]   In 4 months Pickard, Cammie Mcgee, MD [place], [place]

## 2022-08-03 ENCOUNTER — Telehealth: Payer: Self-pay

## 2022-08-03 ENCOUNTER — Other Ambulatory Visit: Payer: Self-pay | Admitting: Family Medicine

## 2022-08-03 ENCOUNTER — Encounter: Payer: Self-pay | Admitting: Family Medicine

## 2022-08-03 NOTE — Telephone Encounter (Signed)
Pt called requesting a refill on her Lomotil. Her last refill was 04/30/2022. Is it okay to send this in for her? Thank you.

## 2022-08-06 ENCOUNTER — Other Ambulatory Visit: Payer: Self-pay | Admitting: Family Medicine

## 2022-08-06 MED ORDER — DIPHENOXYLATE-ATROPINE 2.5-0.025 MG PO TABS: 1.0000 | ORAL_TABLET | Freq: Four times a day (QID) | ORAL | 0 refills | Status: DC | PRN

## 2022-08-07 ENCOUNTER — Other Ambulatory Visit (INDEPENDENT_AMBULATORY_CARE_PROVIDER_SITE_OTHER): Payer: Self-pay | Admitting: Family

## 2022-08-07 DIAGNOSIS — J4541 Moderate persistent asthma with (acute) exacerbation: Secondary | ICD-10-CM

## 2022-08-08 ENCOUNTER — Ambulatory Visit: Payer: Medicare Other | Admitting: Podiatry

## 2022-08-09 ENCOUNTER — Other Ambulatory Visit (INDEPENDENT_AMBULATORY_CARE_PROVIDER_SITE_OTHER): Payer: Self-pay | Admitting: Internal Medicine

## 2022-08-09 DIAGNOSIS — J4541 Moderate persistent asthma with (acute) exacerbation: Secondary | ICD-10-CM

## 2022-08-15 ENCOUNTER — Telehealth (INDEPENDENT_AMBULATORY_CARE_PROVIDER_SITE_OTHER): Payer: Self-pay

## 2022-08-15 NOTE — Telephone Encounter (Signed)
Note to scheduling agent: This smartphrase is being sent to the [employer] pool. This telephone encounter should not be sent as high/low priority.  For Dr. Mont Dutton patients: they will receive a call within 5 business days.     As of 06/15/2022 Dr. Mont Dutton schedule has been blocked. This telephone encounter is being sent on behalf of KAYSEE HERGERT. This patient is an established patient of Dr. Macon Large and is requesting to either schedule a follow up appointment or to re-schedule their appointment.     Please contact Angelene Giovanni at [phone] (mobile)/[phone] (home)    Thank you    -Pt is requesting to schedule with a new provider in [place] due to Dr. Macon Large leaving.

## 2022-08-16 NOTE — Telephone Encounter (Signed)
Facesheet printed to contact pt to schedule.

## 2022-08-28 ENCOUNTER — Other Ambulatory Visit (INDEPENDENT_AMBULATORY_CARE_PROVIDER_SITE_OTHER): Payer: Self-pay | Admitting: Family Medicine

## 2022-08-28 ENCOUNTER — Other Ambulatory Visit (INDEPENDENT_AMBULATORY_CARE_PROVIDER_SITE_OTHER): Payer: Self-pay | Admitting: Family

## 2022-08-28 DIAGNOSIS — E1165 Type 2 diabetes mellitus with hyperglycemia: Secondary | ICD-10-CM

## 2022-08-28 DIAGNOSIS — E7800 Pure hypercholesterolemia, unspecified: Secondary | ICD-10-CM

## 2022-08-28 DIAGNOSIS — E119 Type 2 diabetes mellitus without complications: Secondary | ICD-10-CM

## 2022-08-31 ENCOUNTER — Encounter (INDEPENDENT_AMBULATORY_CARE_PROVIDER_SITE_OTHER): Payer: Self-pay

## 2022-08-31 NOTE — Progress Notes (Signed)
PA for HA injections, both knees  PA Case ID: [id number]  Requested Euflexxa  Dr. Melony Overly patient  Helen [name] has the request and has replied back that this is not covered under Part D of the plan but may be covered under Part B.  Denied today  PA Case: [id number], Status: Denied.

## 2022-09-05 NOTE — Progress Notes (Deleted)
Chief Complaint:  Follow up DM II     HPI:  Helen [name] is a 77 y.o. female with PMH HTN, HLD, Asthma, previous bladder and breast cancer and right bundle branch block followed by Cardiologist, GERDwho presents for follow up management of  type 2 diabetes.  Diabetes type 2 was diagnosed in *** {odhowdmdiagnosed:45417}, and has been managed by prior endocrinologist.    Helen [name] {odDMhistory:45419}, {oddmhospitalizations:45420}, and {oddmpancreatitis:45421}.       In terms of associated symptoms at this time, the patient does not have polyuria, does not have blurred vision, does not have paresthesias, With regards to gastroparesis symptoms, the patient does not have nausea and does not have diarrhea    .   Currently the patient's diet comprises of 2 meals per day.  Prior formal diabetes education/ nutritional counseling has not been performed.                   Current frequency of hypoglycemia is weekly- typically late afternoon.  Symptoms include shaky, hungry.  The patient does have a medic alert bracelet/ID that indicates diabetes status.  The patient does not have an updated glucagon emergency kit.  The patient currently lives with daughter, son-in-law, landlord friend.         Diabetes Medications:  Metformin  Tradjenta  Jardiance  Glipizide       Previous Medications:      Diabetes Complications:  Podiatry: no neuropathy  Renal:   Ophthalmology:  Cardiovascular  Gastroparesis     Lab Results   Component Value Date    CREAT 0.86 05/24/2022    CREAT 0.78 12/25/2021   , microalbumin *** (***)     Ophthalmology: {No/+:23433} retinopathy, {No/+:23433} glaucoma, + cataracts ( history of bilateral cataracts)  last visit ***  Cardiovascular: none    Lipids:   Latest Reference Range & Units 02/19/22 00:00   Cholesterol 100 - 199 mg/dL 045   HDL >40 mg/dL 64   LDL Calculated 0 - 99 mg/dL 62   Triglycerides 0 - 149 mg/dL 58   Cholesterol / HDL Ratio 0.0 - 4.4 ratio 2.2   VLDL Calculated 5 - 40 mg/dL 12      Blood pressure:     Self-monitoring blood glucose: {oddmhpitesting:45425}  Checks Blood sugars:   Fasting:   Before Lunch:  Before Dinner:  Bedtime:  After meals:   Hypoglycemia:     CGM data interpretation:  Device:        14 days data downloaded and reviewed (scanned to chart)     Indication for CGM:     Recommendations based on on CGM:  see a/p    24 Hour diet recall:  1) Breakfast:  2) Lunch:   3) Dinner:  Snacks:  Beverages:    Exercise:       Labs:  Lab Results   Component Value Date    HGBA1C 7.8 (H) 05/24/2022    HGBA1C 8.4 (H) 02/19/2022    HGBA1C 8.3 (H) 12/25/2021          Problem List:  Patient Active Problem List   Diagnosis    Hypertension    Hyperlipidemia    H/O total hysterectomy with removal of both tubes and ovaries    Heart block AV first degree    RBBB (right bundle branch block)    Palpitations    Pure hypercholesterolemia, unspecified    Nonspecific abnormal electrocardiogram (ECG) (EKG)    Type 2 diabetes mellitus without  complication, without long-term current use of insulin    Type 2 diabetes mellitus with hyperglycemia    Anemia       Current Medications:  Current Outpatient Medications on File Prior to Visit   Medication Sig Dispense Refill    albuterol (PROVENTIL) (2.5 MG/3ML) 0.083% nebulizer solution TAKE 3 MLS (2.5 MG TOTAL) BY NEBULIZATION EVERY 4 (FOUR) HOURS AS NEEDED FOR SHORTNESS OF BREATH OR WHEEZING 75 mL 1    albuterol sulfate HFA (PROVENTIL) 108 (90 Base) MCG/ACT inhaler INHALE 2 PUFFS INTO THE LUNGS EVERY 4 HOURS AS NEEDED FOR WHEEZING OR SHORTNESS OF BREATH. 6.7 each 2    amLODIPine (NORVASC) 10 MG tablet TAKE 1 TABLET (10 MG) BY MOUTH DAILY. 90 tablet 3    atorvastatin (LIPITOR) 40 MG tablet Take 1 tablet (40 mg) by mouth daily 90 tablet 1    benzonatate (TESSALON) 100 MG capsule Take 1 capsule (100 mg) by mouth 3 (three) times daily as needed for Cough 30 capsule 0    Blood Glucose Monitoring Suppl (ONE TOUCH ULTRA 2) W/DEVICE KIT One daily 1 each 0    Cholecalciferol  (Vitamin D3) 2000 UNIT capsule Take by mouth daily      desonide (DESOWEN) 0.05 % cream       dexAMETHasone (DECADRON) 0.1 % ophthalmic solution       diclofenac Sodium (VOLTAREN) 1 % Gel topical gel Apply 2 g topically 4 (four) times daily 50 g 0    diphenoxylate-atropine (LOMOTIL) 2.5-0.025 MG per tablet       ferrous sulfate 325 (65 FE) MG tablet ferrous sulfate 325 mg (65 mg iron) tablet      fluocinonide (LIDEX) 0.05 % gel       fluticasone (FLONASE) 50 MCG/ACT nasal spray       fluticasone (FLOVENT HFA) 220 MCG/ACT inhaler INHALE 1 PUFF INTO THE LUNGS TWICE A DAY 36 each 1    glipiZIDE (GLUCOTROL) 10 MG tablet TAKE 2 TABLETS (20 MG) BY MOUTH 2 (TWO) TIMES DAILY BEFORE MEALS 360 tablet 0    glucose blood (OneTouch Ultra) test strip 2 times daily 200 each 3    Jardiance 25 MG tablet TAKE 1 TABLET BY MOUTH EVERY MORNING 30 tablet 3    Lancets (onetouch ultrasoft) lancets Use to check blood sugars 2 times daily 200 each 3    lidocaine (Lidoderm) 5 % Place 1 patch onto the skin daily as needed (pain) Remove & Discard patch within 12 hours 30 patch 0    losartan (COZAAR) 50 MG tablet       meloxicam (MOBIC) 7.5 MG tablet TAKE 1 TABLET BY MOUTH EVERY DAY (Patient not taking: Reported on 07/17/2022) 90 tablet 0    metFORMIN (GLUCOPHAGE) 1000 MG tablet TAKE 1 TABLET BY MOUTH TWICE A DAY 180 tablet 0    montelukast (SINGULAIR) 10 MG tablet Take 1 tablet (10 mg) by mouth daily      NASONEX 50 MCG/ACT nasal spray as needed (Patient not taking: Reported on 07/17/2022)  5    pantoprazole (PROTONIX) 40 MG tablet TAKE 1 TABLET BY MOUTH EVERY DAY 90 tablet 1    Tradjenta 5 MG Tab TAKE 1 TABLET (5 MG TOTAL) BY MOUTH DAILY. 90 tablet 0    valsartan-hydroCHLOROthiazide (DIOVAN-HCT) 160-12.5 MG per tablet Take 1 tablet by mouth daily 90 tablet 3     No current facility-administered medications on file prior to visit.       Allergies:  Allergies   Allergen Reactions  Aspirin      Break out in sweat and dizziness     Peppers      Pollen Extract     Latex Itching and Rash       Past Medical History:  Past Medical History:   Diagnosis Date    Anemia     iron    currently on medication      Arthritis     general body      Arthritis     Generalized    Asthma     Asthma without status asthmaticus     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    Bilateral cataracts     Breast cancer     Breast lump     Cancer     breast cancer    Carcinoma     Carcinoma of the bladder    Chicken pox     Childhood illness    Conduction Disorder     RBBB    Diabetes mellitus type II     Non-Insulin dependent    Echocardiogram 10/2013    Gastroesophageal reflux disease     Hemorrhoids without complication     Holter monitor 02/2014    Hyperlipidemia     Hypertensive disorder     Malignant neoplasm of breast     Measles     Childhood illness    Mumps     Childhood illness    Myocardial perfusion scan 11/2013, 12/2013    MPI single Isotope excrise; MPI dual Isotope Lexiscan    RBBB     Syncope        Past Surgical History:  Past Surgical History:   Procedure Laterality Date    ABLATION OF DYSRHYTHMIC FOCUS      ARTHROSCOPY, KNEE Left 04/21/2015    Procedure: ARTHROSCOPY, KNEE PARTIAL MEDIAL AND LATERAL MENISECTOMY LEFT KNEE;  Surgeon: Bess Kinds, MD;  Location: [facility];  Service: Orthopedics;  Laterality: Left;  partial medial menisectomy    BREAST BIOPSY      HYSTERECTOMY      total    TUMOR REMOVAL Left     neck  benign        Family History:  Family History   Problem Relation Age of Onset    Diabetes Mother     Diabetes Sister     Diabetes Sister     Malignant hyperthermia Neg Hx     Pseudochol deficiency Neg Hx     Anesthesia problems Neg Hx        Social History:  Social History     Socioeconomic History    Marital status: Divorced   Tobacco Use    Smoking status: Never    Smokeless tobacco: Never   Vaping Use    Vaping Use: Never used   Substance and Sexual Activity    Alcohol use: No    Drug use: Never    Sexual activity: Not  Currently     Social Determinants of Health     Food Insecurity: Food Insecurity Present (02/23/2022)    Hunger Vital Sign     Worried About Running Out of Food in the Last Year: Often true     Ran Out of Food in the Last Year: Often true   Transportation Needs: No Transportation Needs (02/23/2022)    PRAPARE - Therapist, art (Medical): No     Lack of Transportation (  Non-Medical): No   Physical Activity: Sufficiently Active (02/23/2022)    Exercise Vital Sign     Days of Exercise per Week: 3 days     Minutes of Exercise per Session: 60 min   Stress: No Stress Concern Present (02/23/2022)    [employer] of Occupational Health - Occupational Stress Questionnaire     Feeling of Stress : Not at all   Social Connections: Moderately Integrated (02/23/2022)    Social Connection and Isolation Panel [NHANES]     Frequency of Communication with Friends and Family: Twice a week     Frequency of Social Gatherings with Friends and Family: Twice a week     Attends Religious Services: More than 4 times per year     Active Member of [employer] or Organizations: Yes     Attends Banker Meetings: Never     Marital Status: Divorced   Catering manager Violence: Unknown (02/23/2022)    Humiliation, Afraid, Rape, and Kick questionnaire     Fear of Current or Ex-Partner: Patient declined     Emotionally Abused: No     Physically Abused: No     Sexually Abused: No   Housing Stability: High Risk (02/23/2022)    Housing Stability Vital Sign     Unable to Pay for Housing in the Last Year: Yes     Number of Places Lived in the Last Year: 1     Unstable Housing in the Last Year: No       ROS:   General: Denies weight gain or weight loss, Denies fatigue  Ophthalmologic: Denies blurry vision, double vision, eye pain  ENT: Denies difficulty swallowing, voice changes  Endocrine: Denies polyuria, increased thirst  Respiratory: Denies cough, shortness of breath   Cardiovascular: Denies palpitations, chest pain, chest  pain with exertion, leg claudication, leg edema  Gastrointestinal: Denies abdominal pain, constipation, diarrhea, nausea, vomiting  Genitourinary: Denies frequent urination, painful urination, denies yeast infection  Musculoskeletal: Denies back pain, joint pain  Skin: Denies rash  Neurologic: Denies tingling, numbness of lower extremities and upper extremities  Psychiatric: Denies anxiety, depressed mood, hypersomnolence, insomnia      There were no vitals taken for this visit.     BP Readings from Last 3 Encounters:   07/17/22 144/74   06/14/22 115/68   05/24/22 136/74        Wt Readings from Last 4 Encounters:   07/17/22 1344 72.1 kg (159 lb)   06/14/22 1115 72.1 kg (159 lb)   05/24/22 1033 73.3 kg (161 lb 9.6 oz)   05/17/22 1351 74.7 kg (164 lb 9.6 oz)       Physical Exam:  GENERAL APPEARANCE: alert, in no acute distress, well developed, well nourished  ORAL CAVITY: normal oropharynx, normal oral mucosa, normal dentition  EYES: EOMI, no lid lag, proptosis b/l  NECK/THYROID: neck supple, no cervical lymphadenopathy, no thyromegaly, no palpable thyroid nodules  HEART: S1, S2 normal, no murmurs, regular rate and rhythm  LUNGS: clear to auscultation bilaterally, no wheezes, rales, rhonchi  ABDOMEN: soft, NTND, nl BS, no hepatomegaly, no splenomegaly  EXTREMITIES: no clubbing, cyanosis, or edema B/L  NEUROLOGY: normal UE reflexes b/l, no hand tremor b/l  SKIN: No acanthosis nigricans, no purple striae, no hirsutism  PSYCHIATRIC: normal mood, appropriate affect  DIABETIC FOOT EXAM:   Sensation: Intact with monofilament  Appearance: no cuts, wounds, no onychomycosis, normal appearing toenails  Pulses: 2+ DP and PT B/L  Assessment/Plan:  Helen [name] is a 77 y.o. female with    1. Diabetes type 2 (new with workup): Will check hemoglobin A1c with labs for next visit.  Based on recent blood sugar pattern of variable fasting sugars and prandial/post-prandial sugars, will adjust medication/insulin as follows:  continue metformin 1000 mg bid with meals, lower Glipizide to 10 mg bid (currently overdosed and suspicion for afternoon lows based on symptoms), Tradjenta 5 mg daily and start once daily Jardiance 25 mg for now.  Check sugars AM fasting and 2 hours after biggest meal of day with goal fasting/pre-meal range of 80-130; goal postprandial range of 80-160.  Is due for eye exam in coming months; will screen for nephropathy and check spot urine microalbumin/Cr annually.  Continue to see podiatry for foot care.  Continue carbohydrate-controlled diet-refer to diabetes educator for counseling.  F/U in 3-4 months to reassess.     2. Hypertension (stable): goal blood pressure is <130/80.  Borderline controlled on regimen of Amlodipine 10 mg daily, Valsartan/HCTZ 160-12.5 mg daily- unable to assess today.     3. Hyperlipidemia (stable): controlled on Lipitor 40 mg daily; goal LDL<100.  Continue low cholesterol, low saturated fat diet.     4. Overweight status/Obesity (stable): counseled patient on at least 150 min/wk of moderate-intensity exercise and consider flexibility/strength training exercises if possible.  Physical activity programs should begin slowly and build up gradually; nutritional counseling status.     5. Hypoglycemia management (stable): counseled patient on need to keep sugar source with her at all times and medic-alert bracelet/indicator specifying diagnosis of diabetes     A total of 60 minutes were spent with the patient which include time spent during face-to-face interaction with the patient and non face-to-face time including time spent reviewing and analyzing records, ordering tests, ordering medications, and completing documentation.  This included time spent counseling the patient on proper administration of medication/insulin, other drug therapy options, dietary recommendations, goals of diabetes therapy, and summarizing potential complications of diabetes.          No diagnosis found.      There are  no diagnoses linked to this encounter.    No orders of the defined types were placed in this encounter.       There are no discontinued medications.     No follow-ups on file.    Thea Gist, NP Chief Complaint:  No chief complaint on file.

## 2022-09-06 ENCOUNTER — Ambulatory Visit (INDEPENDENT_AMBULATORY_CARE_PROVIDER_SITE_OTHER): Payer: Medicare Other | Admitting: Nurse Practitioner

## 2022-09-06 ENCOUNTER — Other Ambulatory Visit (INDEPENDENT_AMBULATORY_CARE_PROVIDER_SITE_OTHER): Payer: Self-pay | Admitting: Family

## 2022-09-06 DIAGNOSIS — E1165 Type 2 diabetes mellitus with hyperglycemia: Secondary | ICD-10-CM

## 2022-09-06 DIAGNOSIS — E119 Type 2 diabetes mellitus without complications: Secondary | ICD-10-CM

## 2022-09-06 MED ORDER — GLIPIZIDE 10 MG PO TABS
20.0000 mg | ORAL_TABLET | Freq: Two times a day (BID) | ORAL | 0 refills | Status: DC
Start: 2022-09-06 — End: 2022-10-01

## 2022-09-06 MED ORDER — TRADJENTA 5 MG PO TABS
1.0000 | ORAL_TABLET | Freq: Every day | ORAL | 0 refills | Status: DC
Start: 2022-09-06 — End: 2022-10-01

## 2022-09-06 MED ORDER — METFORMIN HCL 1000 MG PO TABS
1000.0000 mg | ORAL_TABLET | Freq: Two times a day (BID) | ORAL | 0 refills | Status: DC
Start: 2022-09-06 — End: 2022-10-01

## 2022-09-06 NOTE — Telephone Encounter (Signed)
Per pt she missed her appt with endocrinologist due to a miss understanding, pt will be rescheduling appt. She would like to know if she can obtain a refill asap.      metFORMIN (GLUCOPHAGE) 1000 MG tablet    glipiZIDE (GLUCOTROL) 10 MG tablet (Expired)     Tradjenta 5 MG Tab     Pharmacy:    [id number] [address], [place] Phone: [phone]   Fax: [phone]

## 2022-09-10 ENCOUNTER — Encounter (INDEPENDENT_AMBULATORY_CARE_PROVIDER_SITE_OTHER): Payer: Self-pay

## 2022-09-10 ENCOUNTER — Other Ambulatory Visit (INDEPENDENT_AMBULATORY_CARE_PROVIDER_SITE_OTHER): Payer: Self-pay

## 2022-09-10 DIAGNOSIS — M170 Bilateral primary osteoarthritis of knee: Secondary | ICD-10-CM

## 2022-09-10 MED ORDER — EUFLEXXA 20 MG/2ML IX SOSY
PREFILLED_SYRINGE | INTRA_ARTICULAR | 1 refills | Status: DC
Start: 2022-09-10 — End: 2022-09-11

## 2022-09-10 NOTE — Progress Notes (Unsigned)
Verbal consent has been obtained from the patient to conduct a video visit.  Patient has verified that he/she are physically located in the state of [place] during the time of our appointment.     Chief Complaint:  Follow up DM II    HPI:  Helen [name] is a 77 y.o. female with PMH Asthma, HTN, diabetes  who presents for follow up management of  type 2 diabetes.  Diabetes type 2 was diagnosed in 1991. on routine testing, and has been managed by PCP.        In terms of severity with regards to complications, the patient does not have retinopathy, does not have nephropathy, does not have neuropathy, and does not have cardiovascular disease.                 With regards to the quality of blood sugar control, the most recent hemoglobin A1c test was 7.8 in on 05/24/22.  The patient checks blood sugars one time per day (states her numbers are mostly in the 150 in am fasting and in the afternoon 125-140 at varying times) alternate in the morning and in afternoon. Blood sugar numbers have improved.  Current anti-hyperglycemic regimen is comprised of Tradjenta 5 mg daily, metformin 1000 mg bid, Glipizide 20 mg bid, Jardiance 25 mg daily .                 In terms of associated symptoms at this time, the patient does not have polyuria, does not have blurred vision, does not have paresthesias,  With regards to gastroparesis the patient does not have nausea and does not have diarrhea     Per the patient's history, last fundoscopic exam was in 2023 and usually gets notice from Ophthalmology office for her next appointment. She sees podiatry for ostheoathritis of the foot and calyces once a week with no neuropathy symptoms.      .   Currently the patient's diet comprises of 2 meals per day.  Prior formal diabetes education/ nutritional counseling has not been performed.       Patient has not experienced any hypoglycemia symptoms since her last visit.     Diabetes Medications:  Metformin 1000 mg twice daily  Glipizide 20 mg  twice daily  Jardiance 25 mg daily  Tradjenta 5 mg daily      Diabetes Complications:  Podiatry: no neuropathy  Renal: None   Lab Results   Component Value Date    CREAT 0.86 05/24/2022    CREAT 0.78 12/25/2021     Ophthalmology: no retinopathy, no glaucoma, + cataracts, last visit in 2023.   Cardiovascular: none    Lipids: TC 138 trig 58, LDL 62, HDL 64  Blood pressure:     Self-monitoring blood glucose:  Glucometer at home  Checks Blood sugars:   Fasting: 150  Before Lunch: None  Before Dinner: 125-140  Bedtime: None  After meals:   Hypoglycemia:       24 Hour diet recall:  1) Breakfast: Scrambled eggs, [place] bacon and piece of wheat toast   2) Lunch: skip lunch, in the afternoon yogurt and piece of fruit like strawberries and grapes   3) Dinner: Fish and sweet potatoes, vegetables green beans and broccoli   Snacks: Granola bar with no sugar and cholesterol reading labels   Beverages: Water, lemonade with no sugar, tea, decaf coffee with 1% milk     Exercise:   Regular exercise walking three times/ week 1 hour each time  Labs:  Lab Results   Component Value Date    HGBA1C 7.8 (H) 05/24/2022    HGBA1C 8.4 (H) 02/19/2022    HGBA1C 8.3 (H) 12/25/2021          Problem List:  Patient Active Problem List   Diagnosis    Hypertension    Hyperlipidemia    H/O total hysterectomy with removal of both tubes and ovaries    Heart block AV first degree    RBBB (right bundle branch block)    Palpitations    Pure hypercholesterolemia, unspecified    Nonspecific abnormal electrocardiogram (ECG) (EKG)    Type 2 diabetes mellitus without complication, without long-term current use of insulin    Type 2 diabetes mellitus with hyperglycemia    Anemia       Current Medications:  Current Outpatient Medications on File Prior to Visit   Medication Sig Dispense Refill    albuterol (PROVENTIL) (2.5 MG/3ML) 0.083% nebulizer solution TAKE 3 MLS (2.5 MG TOTAL) BY NEBULIZATION EVERY 4 (FOUR) HOURS AS NEEDED FOR SHORTNESS OF BREATH OR  WHEEZING 75 mL 1    albuterol sulfate HFA (PROVENTIL) 108 (90 Base) MCG/ACT inhaler INHALE 2 PUFFS INTO THE LUNGS EVERY 4 HOURS AS NEEDED FOR WHEEZING OR SHORTNESS OF BREATH. 6.7 each 2    amLODIPine (NORVASC) 10 MG tablet TAKE 1 TABLET (10 MG) BY MOUTH DAILY. 90 tablet 3    atorvastatin (LIPITOR) 40 MG tablet Take 1 tablet (40 mg) by mouth daily 90 tablet 1    Blood Glucose Monitoring Suppl (ONE TOUCH ULTRA 2) W/DEVICE KIT One daily 1 each 0    Cholecalciferol (Vitamin D3) 2000 UNIT capsule Take by mouth daily      desonide (DESOWEN) 0.05 % cream       dexAMETHasone (DECADRON) 0.1 % ophthalmic solution       diclofenac Sodium (VOLTAREN) 1 % Gel topical gel Apply 2 g topically 4 (four) times daily 50 g 0    ferrous sulfate 325 (65 FE) MG tablet Take 1 tablet (325 mg) by mouth every morning with breakfast      fluocinonide (LIDEX) 0.05 % gel       fluticasone (FLONASE) 50 MCG/ACT nasal spray 2 sprays by Nasal route daily      fluticasone (FLOVENT HFA) 220 MCG/ACT inhaler INHALE 1 PUFF INTO THE LUNGS TWICE A DAY 36 each 1    glipiZIDE (GLUCOTROL) 10 MG tablet Take 2 tablets (20 mg) by mouth 2 (two) times daily before meals 120 tablet 0    glucose blood (OneTouch Ultra) test strip 2 times daily 200 each 3    Jardiance 25 MG tablet TAKE 1 TABLET BY MOUTH EVERY MORNING 30 tablet 3    Lancets (onetouch ultrasoft) lancets Use to check blood sugars 2 times daily 200 each 3    lidocaine (Lidoderm) 5 % Place 1 patch onto the skin daily as needed (pain) Remove & Discard patch within 12 hours 30 patch 0    linaGLIPtin (Tradjenta) 5 MG Tab Take 1 tablet (5 mg) by mouth daily 30 tablet 0    metFORMIN (GLUCOPHAGE) 1000 MG tablet Take 1 tablet (1,000 mg) by mouth 2 (two) times daily 60 tablet 0    montelukast (SINGULAIR) 10 MG tablet Take 1 tablet (10 mg) by mouth daily      NASONEX 50 MCG/ACT nasal spray 2 sprays by Nasal route daily as needed  5    pantoprazole (PROTONIX) 40 MG tablet TAKE 1 TABLET BY MOUTH  EVERY DAY 90 tablet 1     valsartan-hydroCHLOROthiazide (DIOVAN-HCT) 160-12.5 MG per tablet Take 1 tablet by mouth daily 90 tablet 3    [DISCONTINUED] losartan (COZAAR) 50 MG tablet Take 1 tablet (50 mg) by mouth daily      [DISCONTINUED] benzonatate (TESSALON) 100 MG capsule Take 1 capsule (100 mg) by mouth 3 (three) times daily as needed for Cough (Patient not taking: Reported on 09/11/2022) 30 capsule 0    [DISCONTINUED] diphenoxylate-atropine (LOMOTIL) 2.5-0.025 MG per tablet  (Patient not taking: Reported on 09/11/2022)      [DISCONTINUED] meloxicam (MOBIC) 7.5 MG tablet TAKE 1 TABLET BY MOUTH EVERY DAY (Patient not taking: Reported on 09/11/2022) 90 tablet 0    [DISCONTINUED] Sodium Hyaluronate, Viscosup, (Euflexxa) 20 MG/2ML Solution Prefilled Syringe injection Inject one syringe into the intra articular space, bilateral knees, weekly x 3 weeks (Patient not taking: Reported on 09/11/2022) 12 mL 1     No current facility-administered medications on file prior to visit.       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    Bilateral cataracts     Breast cancer     Breast lump     Cancer     breast cancer    Carcinoma     Carcinoma of the bladder    Chicken pox     Childhood illness    Conduction Disorder     RBBB    Diabetes mellitus type II     Non-Insulin dependent    Echocardiogram 10/2013    Gastroesophageal reflux disease     Hemorrhoids without complication     Holter monitor 02/2014    Hyperlipidemia     Hypertensive disorder     Malignant neoplasm of breast     Measles     Childhood illness    Mumps     Childhood illness    Myocardial perfusion  scan 11/2013, 12/2013    MPI single Isotope excrise; MPI dual Isotope Lexiscan    RBBB     Syncope        Past Surgical History:  Past Surgical History:   Procedure Laterality Date    ABLATION OF DYSRHYTHMIC FOCUS      ARTHROSCOPY, KNEE Left 04/21/2015    Procedure: ARTHROSCOPY, KNEE PARTIAL MEDIAL AND LATERAL MENISECTOMY LEFT KNEE;  Surgeon: Bess Kinds, MD;  Location: [facility];  Service: Orthopedics;  Laterality: Left;  partial medial menisectomy    BREAST BIOPSY      HYSTERECTOMY      total    TUMOR REMOVAL Left     neck  benign        Family History:  Family History   Problem Relation Age of Onset    Diabetes Mother     Diabetes Sister     Diabetes  Sister     Malignant hyperthermia Neg Hx     Pseudochol deficiency Neg Hx     Anesthesia problems Neg Hx        Social History:  Social History     Socioeconomic History    Marital status: Divorced   Tobacco Use    Smoking status: Never    Smokeless tobacco: Never   Vaping Use    Vaping Use: Never used   Substance and Sexual Activity    Alcohol use: No    Drug use: Never    Sexual activity: Not Currently     Social Determinants of Health     Financial Resource Strain: Medium Risk (09/06/2022)    Overall Financial Resource Strain (CARDIA)     Difficulty of Paying Living Expenses: Somewhat hard   Food Insecurity: No Food Insecurity (09/06/2022)    Hunger Vital Sign     Worried About Running Out of Food in the Last Year: Never true     Ran Out of Food in the Last Year: Never true   Transportation Needs: No Transportation Needs (09/06/2022)    PRAPARE - Therapist, art (Medical): No     Lack of Transportation (Non-Medical): No   Physical Activity: Insufficiently Active (09/06/2022)    Exercise Vital Sign     Days of Exercise per Week: 1 day     Minutes of Exercise per Session: 60 min   Stress: Stress Concern Present (09/06/2022)    [employer] of Occupational Health - Occupational Stress Questionnaire     Feeling of Stress : Very much   Social  Connections: Moderately Isolated (09/06/2022)    Social Connection and Isolation Panel [NHANES]     Frequency of Communication with Friends and Family: Three times a week     Frequency of Social Gatherings with Friends and Family: Twice a week     Attends Religious Services: More than 4 times per year     Active Member of [employer] or Organizations: No     Attends Banker Meetings: Patient declined     Marital Status: Separated   Intimate Partner Violence: Not At Risk (09/06/2022)    Humiliation, Afraid, Rape, and Kick questionnaire     Fear of Current or Ex-Partner: No     Emotionally Abused: No     Physically Abused: No     Sexually Abused: No   Housing Stability: Low Risk  (09/06/2022)    Housing Stability Vital Sign     Unable to Pay for Housing in the Last Year: No     Number of Places Lived in the Last Year: 1     Unstable Housing in the Last Year: No       ROS:   General: Denies weight gain or weight loss, Denies fatigue  Ophthalmologic: Denies blurry vision, double vision, eye pain  ENT: Denies difficulty swallowing, voice changes  Endocrine: Denies polyuria, increased thirst  Respiratory: Denies cough, shortness of breath   Cardiovascular: Denies palpitations, chest pain, chest pain with exertion, leg claudication, leg edema  Gastrointestinal: Denies abdominal pain, constipation, diarrhea, nausea, vomiting  Genitourinary: Denies frequent urination, painful urination, denies yeast infection  Musculoskeletal: Denies back pain, joint pain  Skin: Denies rash  Neurologic: Denies tingling, numbness of lower extremities and upper extremities  Psychiatric: Denies anxiety, depressed mood, hypersomnolence, insomnia      Visit Vitals  Ht 1.575 m (5\' 2" )   Wt 68.9 kg (152 lb)  BMI 27.80 kg/m        BP Readings from Last 3 Encounters:   07/17/22 144/74   06/14/22 115/68   05/24/22 136/74        Wt Readings from Last 4 Encounters:   09/11/22 0819 68.9 kg (152 lb)   07/17/22 1344 72.1 kg (159 lb)   06/14/22 1115 72.1  kg (159 lb)   05/24/22 1033 73.3 kg (161 lb 9.6 oz)       Physical Exam:  GENERAL APPEARANCE: alert, in no acute distress, well developed, well nourished  NECK/THYROID: neck supple  PSYCHIATRIC: normal mood, appropriate affect  Pulmonary: Breathing normally with no wheezes      Assessment/Plan:  Helen [name] is a 77 y.o. female with    1. Controlled type 2 diabetes mellitus without complication, without long-term current use of insulin    2. Type 2 diabetes mellitus with hyperglycemia, with long-term current use of insulin        1. Controlled type 2 diabetes mellitus without complication, without long-term current use of insulin  - Hemoglobin A1C; Future  - Urine Microalbumin Random; Future  - Lipid panel; Future  Diabetes type 2:  Will check hemoglobin A1c with labs for next visit.  Based on recent blood sugar pattern of variable fasting sugars and prandial/post-prandial sugars, will continue medications as follows: continue metformin 1000 mg bid with meals, Continue Glipizide to 20 mg bid, Tradjenta 5 mg daily and Jardiance 25 mg daily.  Check sugars AM fasting and 2 hours after biggest meal of day with goal fasting/pre-meal range of 80-130; goal postprandial range of 80-160.  Is due for eye exam in coming months; recommended to make an appointment with Ophthalmologist,  will screen for nephropathy and check spot urine microalbumin/Cr annually.  Continue to see podiatry for foot care.  Continue carbohydrate-controlled diet and exercise daily.      2. Hypertension (stable): goal blood pressure is <130/80.  Does not check BP at home  Instructed patient to check BP daily and make sure BP < 130/80  Continue Valsartan- HCT 160-12.5 mg daily  Continue Amlodipine 10 mg daily     3. Hyperlipidemia (stable): controlled on Lipitor 40 mg daily; goal LDL<100.  Continue low cholesterol, low saturated fat diet.     4. Overweight status (stable): counseled patient on at least 150 min/wk of moderate-intensity exercise and  consider flexibility/strength training exercises if possible.  Physical activity programs should begin slowly and build up gradually; nutritional counseling status.     5. Hypoglycemia management (stable): counseled patient on need to keep sugar source with her at all times and medic-alert bracelet/indicator specifying diagnosis of diabetes  Educated patient about hypoglycemia symptoms and management of hypoglycemia     A total of 50 minutes were spent with the patient which include time spent during face-to-face interaction with the patient and non face-to-face time including time spent reviewing and analyzing records, ordering tests, ordering medications, and completing documentation.  This included time spent counseling the patient on proper administration of medication/insulin, other drug therapy options, dietary recommendations, goals of diabetes therapy, and summarizing potential complications of diabetes.            Medications Discontinued During This Encounter   Medication Reason    diphenoxylate-atropine (LOMOTIL) 2.5-0.025 MG per tablet Therapy completed    Sodium Hyaluronate, Viscosup, (Euflexxa) 20 MG/2ML Solution Prefilled Syringe injection Therapy completed    meloxicam (MOBIC) 7.5 MG tablet Therapy completed    benzonatate (TESSALON) 100  MG capsule Therapy completed    losartan (COZAAR) 50 MG tablet Alternate therapy        Follow up in 3 months.     Thea Gist, NP

## 2022-09-10 NOTE — Progress Notes (Signed)
[facility] [id number],08/31/22- 02/26/23, Euflexxa both knees,  Ordering to [facility], although [facility] has not provided any additional details in their letter.  Dr. Gerlene Fee patient.  [facility] location.

## 2022-09-10 NOTE — Progress Notes (Signed)
Sending in another request for Euflexxa for both knees, [facility], submitted via electronic system.

## 2022-09-11 ENCOUNTER — Telehealth (INDEPENDENT_AMBULATORY_CARE_PROVIDER_SITE_OTHER): Payer: 59 | Admitting: Nurse Practitioner

## 2022-09-11 ENCOUNTER — Encounter (INDEPENDENT_AMBULATORY_CARE_PROVIDER_SITE_OTHER): Payer: Self-pay | Admitting: Nurse Practitioner

## 2022-09-11 VITALS — Ht 62.0 in | Wt 152.0 lb

## 2022-09-11 DIAGNOSIS — E1165 Type 2 diabetes mellitus with hyperglycemia: Secondary | ICD-10-CM

## 2022-09-11 DIAGNOSIS — Z794 Long term (current) use of insulin: Secondary | ICD-10-CM

## 2022-09-11 DIAGNOSIS — E119 Type 2 diabetes mellitus without complications: Secondary | ICD-10-CM

## 2022-09-11 NOTE — Progress Notes (Signed)
No log    Checks fasting. Usually 150-170.

## 2022-09-13 DIAGNOSIS — H25811 Combined forms of age-related cataract, right eye: Secondary | ICD-10-CM | POA: Diagnosis not present

## 2022-09-13 DIAGNOSIS — H25812 Combined forms of age-related cataract, left eye: Secondary | ICD-10-CM | POA: Diagnosis not present

## 2022-09-16 ENCOUNTER — Encounter: Payer: Self-pay | Admitting: Family Medicine

## 2022-09-18 ENCOUNTER — Encounter (INDEPENDENT_AMBULATORY_CARE_PROVIDER_SITE_OTHER): Payer: Self-pay

## 2022-09-20 ENCOUNTER — Encounter: Payer: Self-pay | Admitting: Family Medicine

## 2022-09-21 ENCOUNTER — Ambulatory Visit: Payer: Medicare Other

## 2022-09-21 ENCOUNTER — Other Ambulatory Visit (INDEPENDENT_AMBULATORY_CARE_PROVIDER_SITE_OTHER): Payer: Self-pay | Admitting: Family Medicine

## 2022-09-21 ENCOUNTER — Other Ambulatory Visit (INDEPENDENT_AMBULATORY_CARE_PROVIDER_SITE_OTHER): Payer: Self-pay | Admitting: Family

## 2022-09-21 ENCOUNTER — Other Ambulatory Visit (INDEPENDENT_AMBULATORY_CARE_PROVIDER_SITE_OTHER): Payer: Self-pay | Admitting: Endocrinology, Diabetes and Metabolism

## 2022-09-21 VITALS — BP 120/62 | HR 77

## 2022-09-21 DIAGNOSIS — E1165 Type 2 diabetes mellitus with hyperglycemia: Secondary | ICD-10-CM

## 2022-09-21 DIAGNOSIS — E7800 Pure hypercholesterolemia, unspecified: Secondary | ICD-10-CM

## 2022-09-21 DIAGNOSIS — E119 Type 2 diabetes mellitus without complications: Secondary | ICD-10-CM

## 2022-09-21 DIAGNOSIS — K219 Gastro-esophageal reflux disease without esophagitis: Secondary | ICD-10-CM

## 2022-09-21 DIAGNOSIS — I10 Essential (primary) hypertension: Secondary | ICD-10-CM

## 2022-09-24 NOTE — Telephone Encounter (Signed)
Last Visit:  09/11/22    ICD:   E11.9    Due for f/u:  12/11/22    F/u appt: 12/11/22    Last lab:    Component      Latest Ref Rng 05/24/2022   Hemoglobin A1C      4.8 - 5.6 % 7.8 (H)       Legend:  (H) High    Please sign pended orders if appropriate.

## 2022-09-25 ENCOUNTER — Ambulatory Visit: Payer: Medicare Other | Admitting: Family Medicine

## 2022-09-26 ENCOUNTER — Telehealth (INDEPENDENT_AMBULATORY_CARE_PROVIDER_SITE_OTHER): Payer: Self-pay | Admitting: Family

## 2022-09-26 NOTE — Telephone Encounter (Signed)
Pt is calling as she has order for mammogram for annual mammogram, but went to get mammogram imaging done, but the site she goes to at [place] has permanently closed and she is requesting if possible, if PCP can either provide information on their new location or another sister location and how she may get her previous imaging slider from that office or business.    Please call pt back at [phone] ASAP, as pt is trying to get imaging done.  Pt wants to ensure she can get imaging done ASAP

## 2022-09-26 NOTE — Telephone Encounter (Signed)
Already sent and confirmed by pharmacy.

## 2022-09-26 NOTE — Telephone Encounter (Signed)
Spoke with pt and advised to try going to [facility]. Pt requested mammo order to be mailed to her house. Advised that we are unsure how she can reach out to her old radiologist's office regarding her records. Pt verbalizes understanding.

## 2022-09-26 NOTE — Telephone Encounter (Signed)
Pt is calling to fu on a refill request for atorvastatin (LIPITOR) 40 MG tablet (Order [id number]) to be filled at [id number] - [place], [place]. Please advise.    Thank you!

## 2022-09-27 DIAGNOSIS — H25812 Combined forms of age-related cataract, left eye: Secondary | ICD-10-CM | POA: Diagnosis not present

## 2022-09-27 DIAGNOSIS — H269 Unspecified cataract: Secondary | ICD-10-CM | POA: Diagnosis not present

## 2022-09-30 ENCOUNTER — Other Ambulatory Visit (INDEPENDENT_AMBULATORY_CARE_PROVIDER_SITE_OTHER): Payer: Self-pay | Admitting: Family

## 2022-09-30 ENCOUNTER — Other Ambulatory Visit (INDEPENDENT_AMBULATORY_CARE_PROVIDER_SITE_OTHER): Payer: Self-pay | Admitting: Family Medicine

## 2022-09-30 DIAGNOSIS — E119 Type 2 diabetes mellitus without complications: Secondary | ICD-10-CM

## 2022-09-30 DIAGNOSIS — E1165 Type 2 diabetes mellitus with hyperglycemia: Secondary | ICD-10-CM

## 2022-10-01 ENCOUNTER — Other Ambulatory Visit: Payer: Self-pay | Admitting: Endocrinology, Diabetes and Metabolism

## 2022-10-01 DIAGNOSIS — E119 Type 2 diabetes mellitus without complications: Secondary | ICD-10-CM

## 2022-10-01 DIAGNOSIS — E1165 Type 2 diabetes mellitus with hyperglycemia: Secondary | ICD-10-CM

## 2022-10-01 MED ORDER — EMPAGLIFLOZIN 25 MG PO TABS
25.0000 mg | ORAL_TABLET | Freq: Every morning | ORAL | 1 refills | Status: DC
Start: 2022-10-01 — End: 2023-09-24

## 2022-10-01 MED ORDER — TRADJENTA 5 MG PO TABS
1.0000 | ORAL_TABLET | Freq: Every day | ORAL | 1 refills | Status: DC
Start: 2022-10-01 — End: 2023-01-18

## 2022-10-01 MED ORDER — GLIPIZIDE 10 MG PO TABS: 20.0000 mg | ORAL_TABLET | Freq: Two times a day (BID) | ORAL | 1 refills | Status: AC

## 2022-10-01 MED ORDER — METFORMIN HCL 1000 MG PO TABS
1000.0000 mg | ORAL_TABLET | Freq: Two times a day (BID) | ORAL | 1 refills | Status: DC
Start: 2022-10-01 — End: 2023-03-27

## 2022-10-01 NOTE — Telephone Encounter (Signed)
Patient last seen on 09/11/22. Next office visit on 12/10/21.

## 2022-10-01 NOTE — Addendum Note (Signed)
Addended by: Adriana Mccallum II on: 10/01/2022 02:11 PM     Modules accepted: Orders

## 2022-10-01 NOTE — Telephone Encounter (Signed)
Pt called stating she needs refills on all four of her [facility] she could only remember the names of two, Metformin and Trajenta.

## 2022-10-11 DIAGNOSIS — H269 Unspecified cataract: Secondary | ICD-10-CM | POA: Diagnosis not present

## 2022-10-11 DIAGNOSIS — H25811 Combined forms of age-related cataract, right eye: Secondary | ICD-10-CM | POA: Diagnosis not present

## 2022-10-12 IMAGING — MR MR HIP*R* WO/W CM
6 of 8 series · 31 of 40 positions shown · IV contrast (multihance)
Comparison: Bilateral hip x-rays dated July 04, 2015.

CLINICAL DATA: Right-sided buttock pain radiating into the upper
leg for the past several months. No injury or prior surgery.

EXAM:
MRI OF THE RIGHT HIP WITHOUT AND WITH CONTRAST
TECHNIQUE: Multiplanar, multisequence MR imaging was performed both before and
after administration of intravenous contrast.
CONTRAST:  16mL MULTIHANCE GADOBENATE DIMEGLUMINE 529 MG/ML IV SOLN

[Series 4: T1 · coronal · 4.0mm · 0.74mm/px · 5 of 18 slices shown]
[im 1/18]
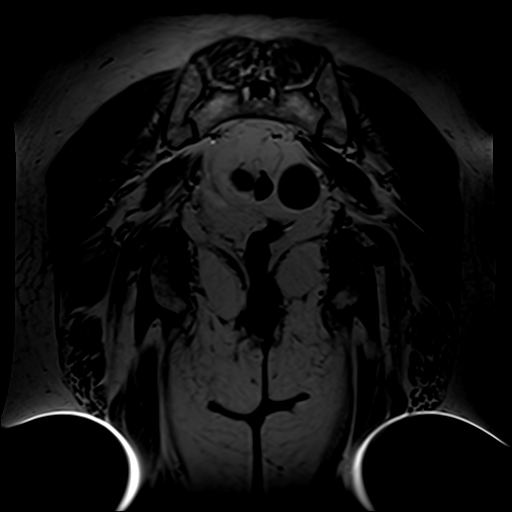
[im 5/18]
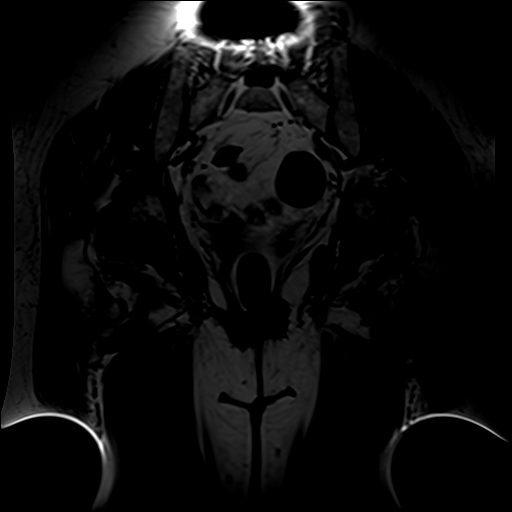
[im 9/18]
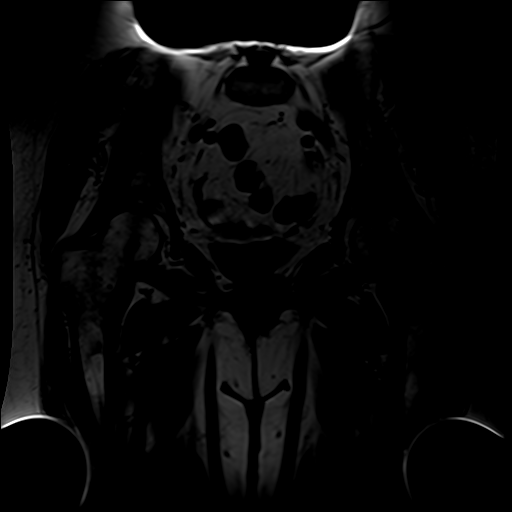
[im 13/18]
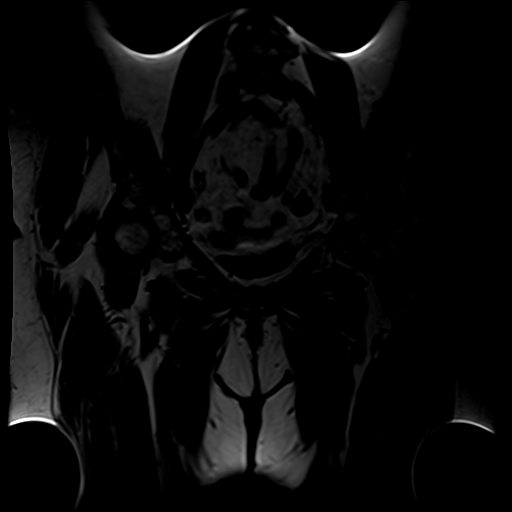
[im 18/18]
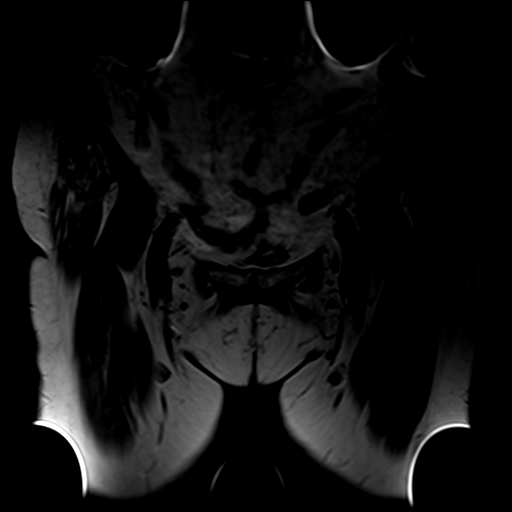

[Series 5: T2 fat-sat · coronal · 4.0mm · 0.74mm/px · 4 of 18 slices shown (1 of 2)]
[im 1/18]
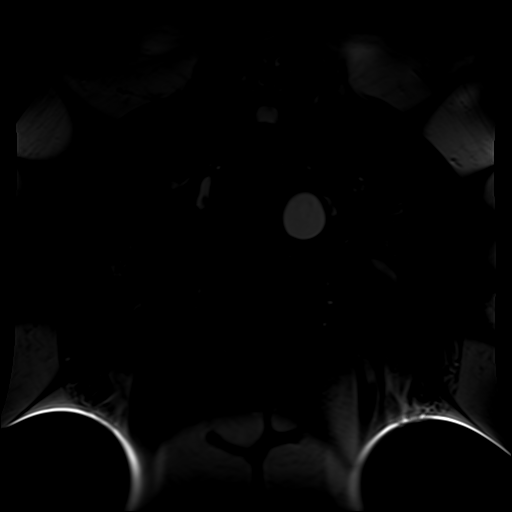
[im 6/18]
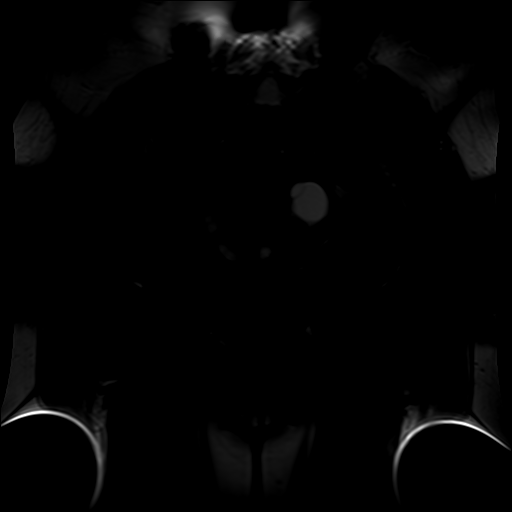
[im 12/18]
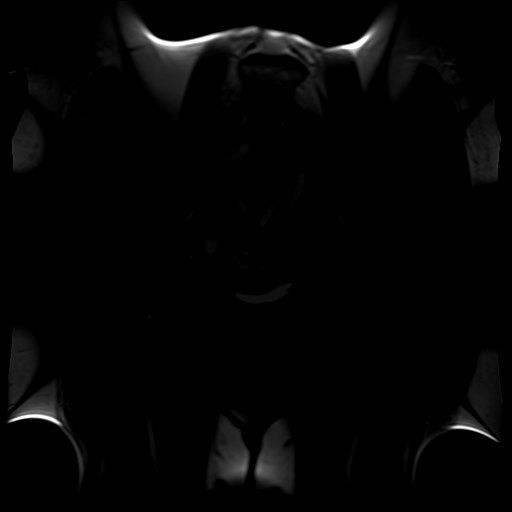
[im 18/18]
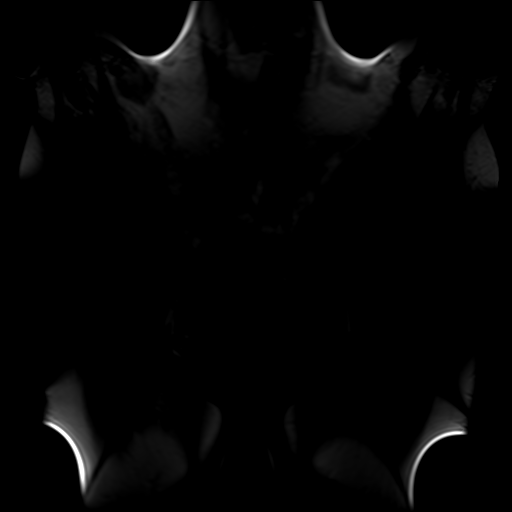

[Series 6: T2 fat-sat · axial · 4.0mm · 0.78mm/px · z∈[-59,+101]mm · 7 of 33 slices shown (2 of 2)]
[im 1/33]
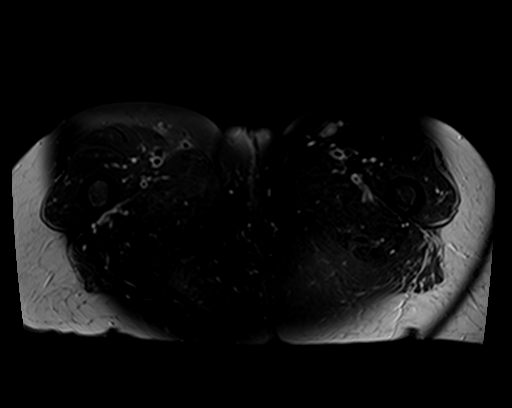
[im 6/33]
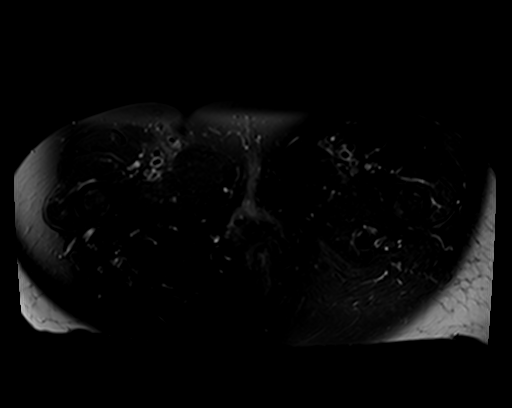
[im 11/33]
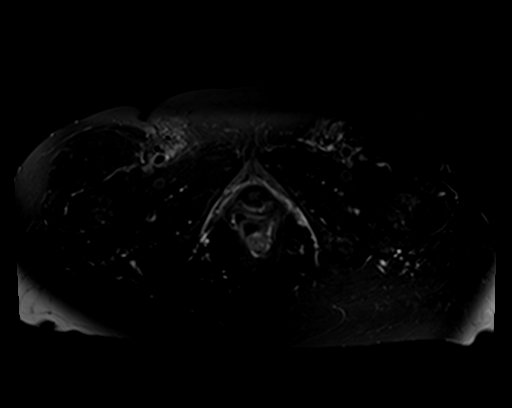
[im 17/33]
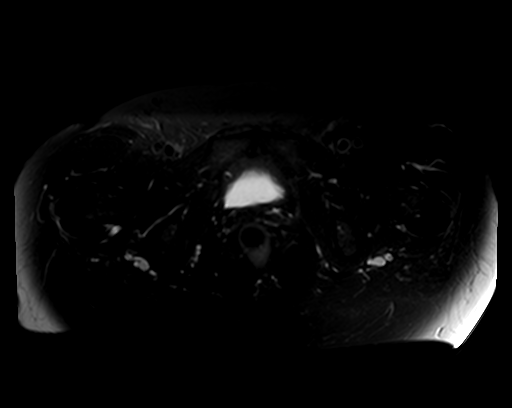
[im 22/33]
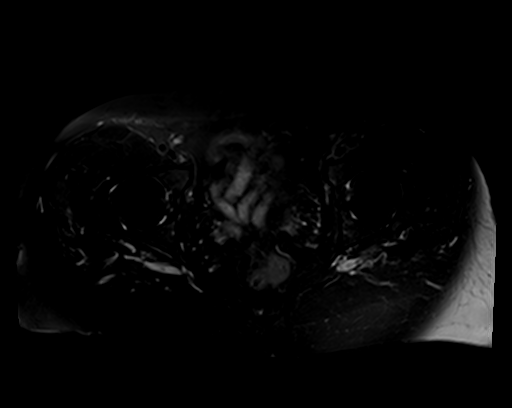
[im 27/33]
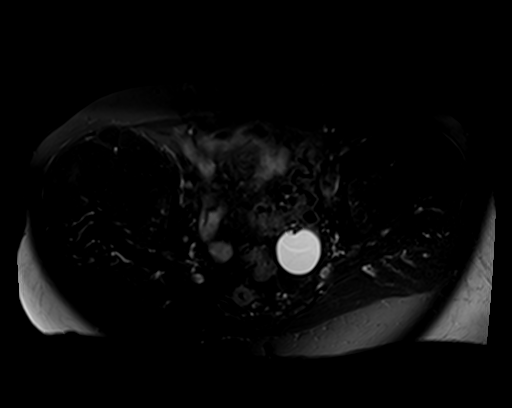
[im 33/33]
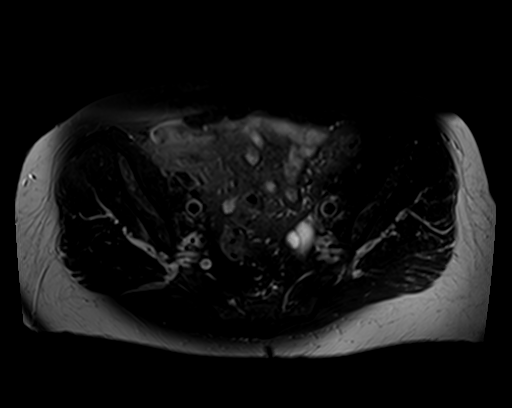

[Series 7: PD fat-sat · sagittal · 4.0mm · 0.70mm/px · 6 of 26 slices shown (1 of 2)]
[im 1/26]
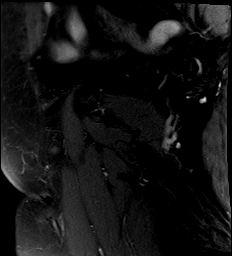
[im 6/26]
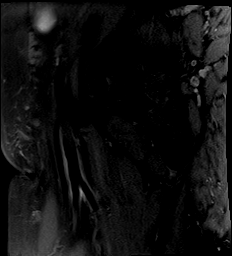
[im 11/26]
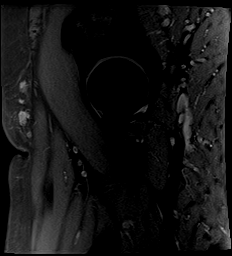
[im 16/26]
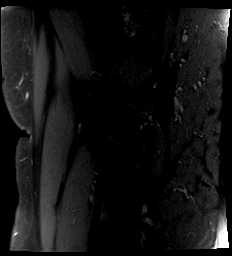
[im 21/26]
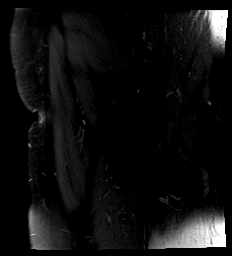
[im 26/26]
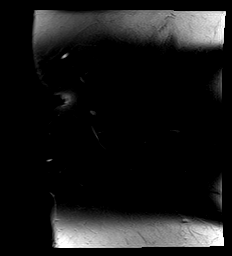

[Series 8: PD fat-sat · coronal · 4.0mm · 0.70mm/px · 4 of 19 slices shown (2 of 2)]
[im 1/19]
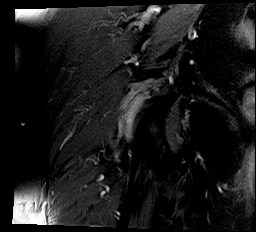
[im 7/19]
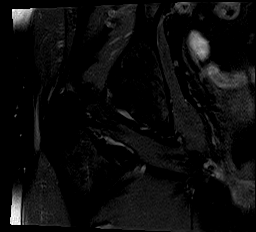
[im 13/19]
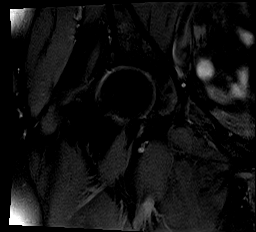
[im 19/19]
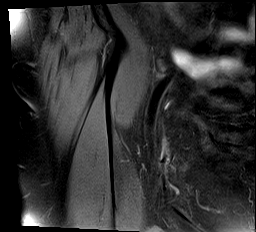

[Series 9: T1 fat-sat · axial · 4.0mm · 0.70mm/px · z∈[-17,+98]mm · 5 of 24 slices shown]
[im 1/24]
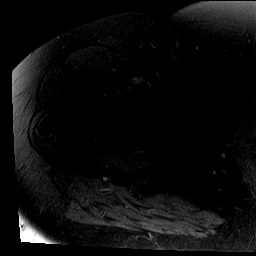
[im 6/24]
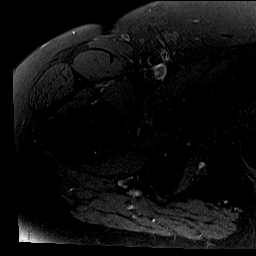
[im 12/24]
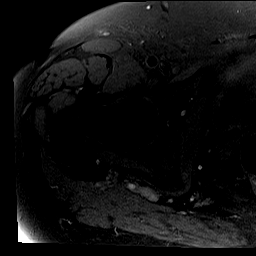
[im 18/24]
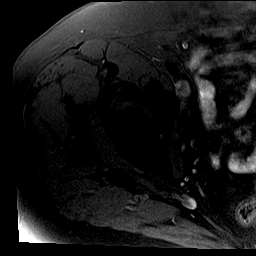
[im 24/24]
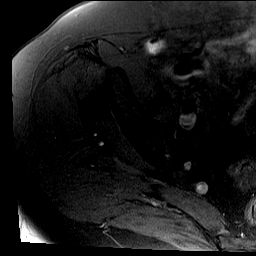

[31 of 40 positions shown; findings below may reference images not displayed]

FINDINGS: Bones: There is no evidence of acute fracture, dislocation or
avascular necrosis. No focal bone lesion. The visualized sacroiliac
joints and symphysis pubis appear normal.

Articular cartilage and labrum

Articular cartilage: No focal chondral defect or subchondral signal
abnormality identified.

Labrum: Grossly intact, although evaluation is limited due to lack
of intra-articular fluid. No paralabral abnormality.

Joint or bursal effusion

Joint effusion: No significant hip joint effusion.

Bursae: No focal periarticular fluid collection.

Muscles and tendons

Muscles and tendons: Mild right gluteus medius tendinosis and
peritendinous edema/enhancement. The visualized hamstring,
iliopsoas, and left gluteus tendons appear normal. No muscle edema.
Bilateral gluteus minimus muscle atrophy.

Other findings

Miscellaneous: Prior hysterectomy. 3.9 x 3.6 x 4.1 cm complex cyst
in the left adnexa with a thin internal septation superiorly and
small nodule inferiorly (series 5, image 5).
IMPRESSION: 1. Mild right gluteus medius tendinosis.
2. 3.9 x 3.6 x 4.1 cm complex cyst in the left adnexa. Pelvic
ultrasound is recommended for further evaluation.

## 2022-10-16 ENCOUNTER — Encounter (INDEPENDENT_AMBULATORY_CARE_PROVIDER_SITE_OTHER): Payer: Self-pay | Admitting: Family

## 2022-10-16 ENCOUNTER — Ambulatory Visit (INDEPENDENT_AMBULATORY_CARE_PROVIDER_SITE_OTHER): Payer: 59 | Admitting: Family

## 2022-10-16 VITALS — BP 123/71 | HR 81 | Temp 97.6°F | Wt 153.4 lb

## 2022-10-16 DIAGNOSIS — J4541 Moderate persistent asthma with (acute) exacerbation: Secondary | ICD-10-CM

## 2022-10-16 DIAGNOSIS — J069 Acute upper respiratory infection, unspecified: Secondary | ICD-10-CM

## 2022-10-16 MED ORDER — AZITHROMYCIN 250 MG PO TABS
ORAL_TABLET | ORAL | 0 refills | Status: AC
Start: 2022-10-16 — End: 2022-10-21

## 2022-10-16 MED ORDER — BENZONATATE 100 MG PO CAPS
100.0000 mg | ORAL_CAPSULE | Freq: Three times a day (TID) | ORAL | 0 refills | Status: AC | PRN
Start: 2022-10-16 — End: 2023-09-17

## 2022-10-16 NOTE — Progress Notes (Signed)
[facility] NOTE      Patient: Helen [name]   Date: 10/16/2022   MRN: [id number]     Helen [name] is a 77 y.o. female    Chief Complaint   Patient presents with    URI     Cough, congestion, wheezing x ~10 days. Pt has a history of asthma.        MEDICATIONS     Current Outpatient Medications   Medication Sig Dispense Refill    albuterol (PROVENTIL) (2.5 MG/3ML) 0.083% nebulizer solution TAKE 3 MLS (2.5 MG TOTAL) BY NEBULIZATION EVERY 4 (FOUR) HOURS AS NEEDED FOR SHORTNESS OF BREATH OR WHEEZING 75 mL 1    albuterol sulfate HFA (PROVENTIL) 108 (90 Base) MCG/ACT inhaler INHALE 2 PUFFS INTO THE LUNGS EVERY 4 HOURS AS NEEDED FOR WHEEZING OR SHORTNESS OF BREATH. 6.7 each 2    amLODIPine (NORVASC) 10 MG tablet TAKE 1 TABLET (10 MG) BY MOUTH DAILY. 90 tablet 3    atorvastatin (LIPITOR) 40 MG tablet TAKE 1 TABLET BY MOUTH EVERY DAY 90 tablet 1    Blood Glucose Monitoring Suppl (ONE TOUCH ULTRA 2) W/DEVICE KIT One daily 1 each 0    Cholecalciferol (Vitamin D3) 2000 UNIT capsule Take by mouth daily      desonide (DESOWEN) 0.05 % cream       dexAMETHasone (DECADRON) 0.1 % ophthalmic solution       diclofenac Sodium (VOLTAREN) 1 % Gel topical gel Apply 2 g topically 4 (four) times daily 50 g 0    empagliflozin (Jardiance) 25 MG tablet Take 1 tablet (25 mg) by mouth every morning 90 tablet 1    ferrous sulfate 325 (65 FE) MG tablet Take 1 tablet (325 mg) by mouth every morning with breakfast      fluocinonide (LIDEX) 0.05 % gel       fluticasone (FLONASE) 50 MCG/ACT nasal spray 2 sprays by Nasal route daily      fluticasone (FLOVENT HFA) 220 MCG/ACT inhaler INHALE 1 PUFF INTO THE LUNGS TWICE A DAY 36 each 1    glipiZIDE (GLUCOTROL) 10 MG tablet Take 2 tablets (20 mg) by mouth 2 (two) times daily before meals 360 tablet 1    glucose blood (OneTouch Ultra) test strip 2 times daily 200 each 3    Lancets (onetouch ultrasoft) lancets Use to check blood sugars 2 times daily 200 each 3    lidocaine  (Lidoderm) 5 % Place 1 patch onto the skin daily as needed (pain) Remove & Discard patch within 12 hours 30 patch 0    linaGLIPtin (Tradjenta) 5 MG Tab Take 1 tablet (5 mg) by mouth daily 90 tablet 1    metFORMIN (GLUCOPHAGE) 1000 MG tablet Take 1 tablet (1,000 mg) by mouth 2 (two) times daily 180 tablet 1    montelukast (SINGULAIR) 10 MG tablet Take 1 tablet (10 mg) by mouth daily      NASONEX 50 MCG/ACT nasal spray 2 sprays by Nasal route daily as needed  5    pantoprazole (PROTONIX) 40 MG tablet TAKE 1 TABLET BY MOUTH EVERY DAY 90 tablet 1    valsartan-hydroCHLOROthiazide (DIOVAN-HCT) 160-12.5 MG per tablet Take 1 tablet by mouth daily 90 tablet 3    azithromycin (ZITHROMAX) 250 MG tablet Two tabs PO day 1 and then one tab PO daily days 2-5 6 tablet 0    benzonatate (TESSALON) 100 MG capsule Take 1 capsule (100 mg) by mouth 3 (three) times  daily as needed for Cough 30 capsule 0     No current facility-administered medications for this visit.       Allergies   Allergen Reactions    Aspirin      Break out in sweat and dizziness     Peppers     Pollen Extract     Latex Itching and Rash       SUBJECTIVE     Chief Complaint   Patient presents with    URI     Cough, congestion, wheezing x ~10 days. Pt has a history of asthma.         HPI  URI symptoms started about 10 days ago, causing her asthma to flare.   OTC mucinex, cough syrup, cough drops without relief.   Has not started her albuterol yet  Is using flovent 1 puff BID- she is est with allergy/ asthma specialist.   Blood sugars 140-150      ROS     Review of Systems   Constitutional:  Positive for fatigue. Negative for chills and fever.   HENT:  Positive for congestion, rhinorrhea and sore throat. Negative for ear pain, sinus pressure and sinus pain.    Respiratory:  Positive for cough (dry) and wheezing. Negative for shortness of breath.    Cardiovascular:  Negative for chest pain.   Gastrointestinal:  Negative for diarrhea, nausea and vomiting.    Musculoskeletal:  Negative for myalgias.     The following portions of the patient's history were reviewed and updated as appropriate: Allergies, Current Medications, Past Family History, Past Medical history, Past social history, Past surgical history, and Problem List.    PHYSICAL EXAM     Vitals:    10/16/22 1057   BP: 123/71   BP Site: Left arm   Patient Position: Sitting   Cuff Size: Medium   Pulse: 81   Temp: 97.6 F (36.4 C)   TempSrc: Temporal   SpO2: 97%   Weight: 69.6 kg (153 lb 6.4 oz)       Physical Exam  Vitals and nursing note reviewed.   Constitutional:       General: She is not in acute distress.     Appearance: Normal appearance. She is well-developed.   HENT:      Head: Normocephalic and atraumatic.      Right Ear: Tympanic membrane and ear canal normal.      Left Ear: Tympanic membrane and ear canal normal.      Nose: Nose normal. No nasal tenderness, mucosal edema or rhinorrhea.      Mouth/Throat:      Lips: Pink.      Mouth: Mucous membranes are moist.      Pharynx: Oropharynx is clear. Uvula midline.   Eyes:      General: Lids are normal.      Conjunctiva/sclera: Conjunctivae normal.   Cardiovascular:      Rate and Rhythm: Normal rate and regular rhythm.      Chest Wall: PMI is not displaced.      Heart sounds: Normal heart sounds.   Pulmonary:      Effort: Pulmonary effort is normal.      Breath sounds: Normal breath sounds and air entry.   Lymphadenopathy:      Cervical: No cervical adenopathy.   Skin:     General: Skin is warm and dry.      Capillary Refill: Capillary refill takes less than 2 seconds.   Neurological:  Mental Status: She is alert and oriented to person, place, and time.   Psychiatric:         Mood and Affect: Mood normal.         Speech: Speech normal.         Behavior: Behavior normal.             ASSESSMENT/PLAN        1. Moderate persistent asthma with acute exacerbation  azithromycin (ZITHROMAX) 250 MG tablet    benzonatate (TESSALON) 100 MG capsule      2. URI  with cough and congestion          Acute, worsening. Pt at high risk for infection- hx asthma and diabetes. Goal to avoid oral corticosteroid. Can increase flovent to 2 puffs BID, then wean to baseline 1 puff BID as tolerate. Start using albuterol PRN. Will cover with antibiotics. Can continue mucinex during the day, take cough suppressant at night. Continue previously prescribed inhalers and nebs. Increase rest and fluids.      Reviewed med use and side effects. Reviewed s/s that would warrant further and/ or immediate medical attention. Pt in agreement with plan and all questions answered.     Ddx: viral URI, pharyngitis, bronchitis, sinusitis, pneumonia, allergic rhinitis, RAD, PND, influenza     Risk & Benefits of the new medication(s) were explained to the patient (and family) who verbalized understanding & agreed to the treatment plan. Patient (family) encouraged to contact me/clinical staff with any questions/concerns      Return if symptoms worsen or fail to improve.    Signed,  Alfonzo Feller, [place], FNP  10/16/2022

## 2022-10-16 NOTE — Progress Notes (Signed)
Have you seen any specialists/other providers since your last visit with [place]?    Yes  Pulmonologist   Endocrinologist       The patient was informed that the following HM items are still outstanding:   Health Maintenance Due   Topic Date Due    Advance Directive on File  Never done    Shingrix Vaccine 50+ (1) Never done    Annual Exam  10/07/2014    COVID-19 Vaccine (4 - 2023-24 season) 05/04/2022

## 2022-10-19 ENCOUNTER — Other Ambulatory Visit: Payer: Self-pay | Admitting: Family

## 2022-10-26 ENCOUNTER — Ambulatory Visit (INDEPENDENT_AMBULATORY_CARE_PROVIDER_SITE_OTHER): Payer: Medicare Other

## 2022-10-28 ENCOUNTER — Encounter (HOSPITAL_COMMUNITY): Payer: Self-pay | Admitting: Emergency Medicine

## 2022-10-28 ENCOUNTER — Emergency Department (HOSPITAL_COMMUNITY)
Admission: EM | Admit: 2022-10-28 | Discharge: 2022-10-28 | Disposition: A | Discharge status: Home / Self Care | Payer: Medicare Other | Attending: Emergency Medicine | Admitting: Emergency Medicine

## 2022-10-28 ENCOUNTER — Other Ambulatory Visit: Payer: Self-pay

## 2022-10-28 ENCOUNTER — Emergency Department (HOSPITAL_COMMUNITY): Payer: Medicare Other

## 2022-10-28 DIAGNOSIS — Y92019 Unspecified place in single-family (private) house as the place of occurrence of the external cause: Secondary | ICD-10-CM | POA: Insufficient documentation

## 2022-10-28 DIAGNOSIS — S5011XA Contusion of right forearm, initial encounter: Secondary | ICD-10-CM | POA: Diagnosis not present

## 2022-10-28 DIAGNOSIS — W19XXXA Unspecified fall, initial encounter: Secondary | ICD-10-CM | POA: Diagnosis not present

## 2022-10-28 DIAGNOSIS — I10 Essential (primary) hypertension: Secondary | ICD-10-CM | POA: Diagnosis not present

## 2022-10-28 DIAGNOSIS — S7001XA Contusion of right hip, initial encounter: Secondary | ICD-10-CM | POA: Diagnosis not present

## 2022-10-28 DIAGNOSIS — R519 Headache, unspecified: Secondary | ICD-10-CM | POA: Insufficient documentation

## 2022-10-28 DIAGNOSIS — M47812 Spondylosis without myelopathy or radiculopathy, cervical region: Secondary | ICD-10-CM | POA: Diagnosis not present

## 2022-10-28 DIAGNOSIS — R001 Bradycardia, unspecified: Secondary | ICD-10-CM | POA: Diagnosis not present

## 2022-10-28 DIAGNOSIS — M79631 Pain in right forearm: Secondary | ICD-10-CM | POA: Diagnosis not present

## 2022-10-28 DIAGNOSIS — M25551 Pain in right hip: Secondary | ICD-10-CM | POA: Diagnosis not present

## 2022-10-28 DIAGNOSIS — Z79899 Other long term (current) drug therapy: Secondary | ICD-10-CM | POA: Insufficient documentation

## 2022-10-28 DIAGNOSIS — R29898 Other symptoms and signs involving the musculoskeletal system: Secondary | ICD-10-CM | POA: Diagnosis not present

## 2022-10-28 DIAGNOSIS — S79911A Unspecified injury of right hip, initial encounter: Secondary | ICD-10-CM | POA: Diagnosis present

## 2022-10-28 DIAGNOSIS — R102 Pelvic and perineal pain: Secondary | ICD-10-CM | POA: Diagnosis not present

## 2022-10-28 MED ORDER — OXYCODONE-ACETAMINOPHEN 5-325 MG PO TABS: 1.0000 | ORAL_TABLET | Freq: Four times a day (QID) | ORAL | 0 refills | Status: DC | PRN

## 2022-10-28 NOTE — ED Provider Notes (Signed)
[facility] Provider Note   CSN: [id number] Arrival date & time: 10/28/22  [id number]     History {Add pertinent medical, surgical, social history, OB history to HPI:1} Chief Complaint  Patient presents with   April Beck    April Beck is a 77 y.o. female.  Patient has a history of hypertension.  She fell yesterday and hurt her right hip her right arm and her neck is bothering her   Fall       Home Medications Prior to Admission medications   Medication Sig Start Date End Date Taking? Authorizing Provider  oxyCODONE-acetaminophen (PERCOCET/ROXICET) 5-325 MG tablet Take 1 tablet by mouth every 6 (six) hours as needed for severe pain. 10/28/22  Yes Milton Ferguson, MD  acetaminophen (TYLENOL) 325 MG tablet Take 2 tablets (650 mg total) by mouth every 6 (six) hours as needed for pain. 06/21/12   Earnstine Regal, PA-C  acyclovir (ZOVIRAX) 400 MG tablet TAKE 1 TABLET EVERY DAY 06/27/22   Susy Frizzle, MD  atorvastatin (LIPITOR) 40 MG tablet TAKE 1 TABLET EVERY DAY 06/27/22   Susy Frizzle, MD  calcium-vitamin D (OSCAL WITH D) 250-125 MG-UNIT tablet Take 1 tablet by mouth daily.    [provider]  cephALEXin (KEFLEX) 500 MG capsule Take 1 capsule (500 mg total) by mouth 3 (three) times daily. 06/15/22   Susy Frizzle, MD  citalopram (CELEXA) 10 MG tablet TAKE 1 TABLET EVERY DAY 06/27/22   Susy Frizzle, MD  diphenoxylate-atropine (LOMOTIL) 2.5-0.025 MG tablet Take 1 tablet by mouth 4 (four) times daily as needed for diarrhea or loose stools. 08/06/22   Susy Frizzle, MD  fluocinonide gel (LIDEX) [id number] % Apply 1 application topically 3 (three) times daily as needed (rash). 09/08/20   [provider]  levothyroxine (SYNTHROID) 50 MCG tablet TAKE 1 TABLET EVERY DAY 06/21/22   Susy Frizzle, MD  losartan (COZAAR) 50 MG tablet Take 1 tablet (50 mg total) by mouth daily. 04/09/22   Susy Frizzle, MD   pantoprazole (PROTONIX) 40 MG tablet TAKE 1 TABLET TWICE DAILY 06/21/22   Susy Frizzle, MD      Allergies    Patient has no known allergies.    Review of Systems   Review of Systems  Physical Exam Updated Vital Signs BP (!) 156/72 (BP Location: Left Arm)   Pulse 62   Temp 98.4 F (36.9 C) (Oral)   Resp 18   Ht '5\' 6"'$  (1.676 m)   Wt 75.8 kg   SpO2 96%   BMI 26.95 kg/m  Physical Exam  ED Results / Procedures / Treatments   Labs (all labs ordered are listed, but only abnormal results are displayed) Labs Reviewed - No data to display  EKG None  Radiology No results found.  Procedures Procedures  {Document cardiac monitor, telemetry assessment procedure when appropriate:1}  Medications Ordered in ED Medications - No data to display  ED Course/ Medical Decision Making/ A&P   {   Click here for ABCD2, HEART and other calculatorsREFRESH Note before signing :1}                          Medical Decision Making Amount and/or Complexity of Data Reviewed Radiology: ordered.  Risk Prescription drug management.   Patient with contusion to right hip and right forearm.  She also has cervical strain.  She was given some Percocet and will follow-up  with orthopedics  {Document critical care time when appropriate:1} {Document review of labs and clinical decision tools ie heart score, Chads2Vasc2 etc:1}  {Document your independent review of radiology images, and any outside records:1} {Document your discussion with family members, caretakers, and with consultants:1} {Document social determinants of health affecting pt's care:1} {Document your decision making why or why not admission, treatments were needed:1} Final Clinical Impression(s) / ED Diagnoses Final diagnoses:  Fall, initial encounter    Rx / DC Orders ED Discharge Orders          Ordered    oxyCODONE-acetaminophen (PERCOCET/ROXICET) 5-325 MG tablet  Every 6 hours PRN        10/28/22 1421

## 2022-10-28 NOTE — Discharge Instructions (Signed)
Follow-up with Dr. Aline Brochure next week for recheck

## 2022-10-28 NOTE — ED Notes (Signed)
Pt able to go to bathroom transferring from wheelchair to toilet.

## 2022-10-28 NOTE — ED Triage Notes (Signed)
Patient brought in via EMS from home. Alert and oriented. Airway patent. Patient fell last night at 8-9pm, able to get up using rails at home. Patient states landed mostly on right hip and back. Patient c/o right hip pain, lower back pain, and neck pain. Patient has had prior surgery to right hip in March 2022. Per patient orthopedic doctor is Dr Nuala Alpha. No obvious abnormalities (rotation/shortening) noted. Pedal and tibial pulses present. Unable to bear weight on right leg. Denies hitting head, LOC, or dizziness. Denies taking any type of anticoagulant.

## 2022-10-28 NOTE — ED Notes (Signed)
Patient does have some swelling to right forearm as well with bruising.

## 2022-10-29 ENCOUNTER — Ambulatory Visit (INDEPENDENT_AMBULATORY_CARE_PROVIDER_SITE_OTHER): Payer: Medicare Other

## 2022-11-01 ENCOUNTER — Encounter: Payer: Self-pay | Admitting: Radiology

## 2022-11-01 ENCOUNTER — Encounter: Payer: Self-pay | Admitting: Family Medicine

## 2022-11-01 ENCOUNTER — Ambulatory Visit (INDEPENDENT_AMBULATORY_CARE_PROVIDER_SITE_OTHER): Payer: Medicare Other

## 2022-11-01 ENCOUNTER — Ambulatory Visit (INDEPENDENT_AMBULATORY_CARE_PROVIDER_SITE_OTHER): Payer: Self-pay

## 2022-11-01 DIAGNOSIS — E119 Type 2 diabetes mellitus without complications: Secondary | ICD-10-CM

## 2022-11-01 DIAGNOSIS — I10 Essential (primary) hypertension: Secondary | ICD-10-CM

## 2022-11-02 ENCOUNTER — Telehealth: Payer: Self-pay

## 2022-11-02 NOTE — Telephone Encounter (Signed)
        Patient  visited [facility] on 10/28/2022  for fall.   Telephone encounter attempt :  1st  A HIPAA compliant voice message was left requesting a return call.  Instructed patient to call back at [phone].   [place] Resource Care Guide   ??[email or web address]  ?? [id number]   Website: [email or web address]  [email or web address]

## 2022-11-05 ENCOUNTER — Telehealth: Payer: Self-pay

## 2022-11-05 NOTE — Telephone Encounter (Signed)
     Patient  visit on 10/28/2022  at [facility] was for fall.  Have you been able to follow up with your primary care physician? Patient is feeling better.  The patient was or was not able to obtain any needed medicine or equipment. Patient was able to obtain medication.  Are there diet recommendations that you are having difficulty following? No  Patient expresses understanding of discharge instructions and education provided has no other needs at this time. Yes   [place] Resource Care Guide   ??[email or web address]  ?? [id number]   Website: [email or web address]  [email or web address]

## 2022-11-07 ENCOUNTER — Telehealth (INDEPENDENT_AMBULATORY_CARE_PROVIDER_SITE_OTHER): Payer: Self-pay | Admitting: Nurse Practitioner

## 2022-11-07 ENCOUNTER — Encounter (INDEPENDENT_AMBULATORY_CARE_PROVIDER_SITE_OTHER): Payer: Self-pay | Admitting: Nurse Practitioner

## 2022-11-07 ENCOUNTER — Ambulatory Visit (INDEPENDENT_AMBULATORY_CARE_PROVIDER_SITE_OTHER): Payer: Medicare Other | Admitting: Gastroenterology

## 2022-11-07 ENCOUNTER — Other Ambulatory Visit (INDEPENDENT_AMBULATORY_CARE_PROVIDER_SITE_OTHER): Payer: Self-pay | Admitting: Nurse Practitioner

## 2022-11-07 VITALS — BP 115/70 | HR 80 | Temp 98.9°F | Wt 157.0 lb

## 2022-11-07 DIAGNOSIS — E119 Type 2 diabetes mellitus without complications: Secondary | ICD-10-CM

## 2022-11-07 DIAGNOSIS — Z86010 Personal history of colonic polyps: Secondary | ICD-10-CM

## 2022-11-07 DIAGNOSIS — D649 Anemia, unspecified: Secondary | ICD-10-CM

## 2022-11-07 DIAGNOSIS — K219 Gastro-esophageal reflux disease without esophagitis: Secondary | ICD-10-CM

## 2022-11-07 MED ORDER — PEG-KCL-NACL-NASULF-NA ASC-C 100 G PO SOLR
ORAL | 0 refills | Status: DC
Start: 2022-11-07 — End: 2023-05-28

## 2022-11-07 MED ORDER — PEG 3350-KCL-NABCB-NACL-NASULF 236 G PO SOLR
1.0000 | Freq: Once | ORAL | 0 refills | Status: DC
Start: 2022-11-07 — End: 2022-11-07

## 2022-11-07 NOTE — Patient Instructions (Signed)
Please stop Trajenta and Jardiance 1 week before procedures    Please schedule endoscopy and colonoscopy    To schedule or reschedule a procedure, please call my procedure coordinator Robyn Free at [phone].     Colonoscopy Preparation Instructions - MOVIPREP FULL TWO (2) DAY PREP + 3 DAYS DAILY MIRALAX  Please carefully read 10 days before your procedure.    IF YOU ARE ON BLOOD THINNERS (COUMADIN, PLAVIX, etc.), INSULIN OR OTHER DIABETIC MEDICATIONS, PLEASE LET us KNOW AND CHECK WITH YOUR PRESCRIBING PHYSICIAN FOR INSTRUCTIONS. Your prescribing provider needs to determine if you should stop or stay on your blood thinner before procedure.  Not following these instructions may result in cancellation.      General Endoscopy Information  Do no chew gum or suck on hard candy the day of your procedure.  You must have a responsible adult to accompany you home after the procedure. This person must pick you up in the endoscopy unit. If you do not have a responsible adult to accompany you home, your procedure will be cancelled.   You may not operate a motor vehicle for the remainder of the day following your procedure.   You may not take a taxi, [place] or bus home unless accompanied by a responsible adult.   If your insurance company requires a REFERRAL, YOU MUST BRING IT WITH YOU. Also, please bring your current insurance card(s), copay (if applicable), and a current picture ID with you on the day of your procedure.   Our office will contact your insurance carrier to verify coverage and, if required, obtain preauthorization for your procedure. However, preauthorization is not a guarantee of payment and you will be responsible for any deductibles, copays, co-insurance, and/or any other plan specific out-of-pocket expenses.   Dependent upon your family history, personal history, prior gastroenterology diagnoses, or findings discovered during your colonoscopy, your procedure may be considered preventative or diagnostic. This  determination will not be made until after the procedure has concluded and will be based upon the findings of your exam. In our experience, many insurance carriers cover preventative and diagnostic colonoscopies differently, and as a result, your out-of-pocket payment may also differ. If you have any questions about your coverage, please contact your insurance carrier directly.   If you have any questions, please contact your GI physician's office.     Preparation Instructions  Nine (9) days before your procedure date:  Daily MiraLAX - you will need to purchase a bottle of MiraLAX from any pharmacy. This is over the counter and does not require a prescription.  Please take one scoop (mixed in an 8-ounce glass of water) once daily for 3 days prior to your 2-day prep.   Avoid any salads or high fiber foods including wheat bread, nuts, seeds, popcorn, and fiber supplements.  Fill your MoviPrep prescriptions for your bowel prep. You should have two MoviPrep prescriptions.     Two (2) days before the procedure:  Drink only clear liquids the entire day. No solid food should be taken. Clear liquids include water, broth without any solid pieces, apple juice, white grape juice, pulp free lemonade, sprite, ginger-ale, coffee or tea without milk or non-dairy creamers, plain Jell-O (no added fruit or toppings, no red, purple, or blue Jell-O. See clear liquid diet below).     At 4 PM:  Step 1 - Empty 1 "pouch A" and 1 "pouch B" into the disposable container. Add lukewarm drinking water to the top line of the container. Mixed  until dissolved. (This may be done up to 24 hours ahead of time to chill in the refrigerator).   Step 2 - The MoviPrep container is divided by 4 marks. Every 15 minutes drink the solution down to the next mark, approximately 8 ounces, until the full liter is complete. Then drink 16 ounces of the clear liquid of your choice.    At 9 PM: Repeat step 2  Step 1 - Empty 1 "pouch A" and 1 "pouch B" into the  disposable container. Add lukewarm drinking water to the top line of the container. until dissolved. (This may be done up to 24 hours ahead of time to chill in the refrigerator).   Step 2 - The MoviPrep container is divided by 4 marks. Every 15 minutes drink the solution down to the next mark, approximately 8 ounces, until the full liter is complete.     * It is not uncommon for individuals to experience bloating or nausea when drinking a solution. If have vomiting or other concerning symptoms, please call your GI physician's office.    One (1) day before your procedure: Split Dose: 1st Dose only  Drink only clear liquids the entire day. No solid food should be taken.   Drink an extra 8 ounces of clear liquids every hour while awake.  At 4 PM:  Step 1 - Empty 1 "pouch A" and 1 "pouch B" into the disposable container. Add lukewarm drinking water to the top line of the container. Mix until dissolved. (This may be done up to 24 hours ahead of time to chill in the refrigerator).   Step 2 - The MoviPrep container is divided by 4 marks. Every 15 minutes drink the solution down to the next mark, approximately 8 ounces, until the full liter is complete. Then drink 16 ounces of the clear liquid of your choice.  Continue to drink clear liquids until you go to bed.    The day of your procedure: Split Dose: 2nd Dose   6 hours before procedure time: Repeat Step 3 from above. Depending on your procedure start time this will require waking up early the morning of your procedure.   Step 1 - Empty 1 "pouch A" and 1 "pouch B" into the disposable container. Add lukewarm drinking water to the top line of the container. Mix until dissolved. (This may be done up to 24 hours ahead of time to chill in the refrigerator).   Step 2 - The MoviPrep container is divided by 4 marks. Every 15 minutes drink the solution down to the next mark, approximately 8 ounces, until the full liter is complete. Then drink 16 ounces of the clear liquid of your  choice.  Complete 2nd dose 4 hours prior to your scheduled start time. Do not take anything by mouth starting 4 hours before your procedure.   Do not eat hard candy or chew gum.   Wear comfortable clothing that is easy to remove and leave jewelry at home. Leave any valuables (i.e., purse, cell phone etc.) with family or companion.   Report to the Endoscopy Procedure Unit 90 minutes before your scheduled procedure time.   Once in the pre-procedural area, you will be asked to put on a hospital gown. A nurse will review your medical history with you (Bring a list of your current medication, allergies, and a copy of your recent EKG). An intravenous line (IV) will be started for your sedation during the procedure.   When your procedure is done,  you may remain in the recovery room for up to 1 hour.   Your doctor will discuss the results of your procedure with you and give you a copy of your report.     Overview of Preparation  9 days before your colonoscopy  Read all prep instructions.   Begin daily dose of MiraLAX. Continue for 7 days.  Fill bowel prep prescription from your pharmacy.   Contact prescribing physician for instructions on dosage of blood thinners and diabetes medications. Stop herbal, vitamins, and oral iron supplements.  Avoid any salads or high fiber foods including wheat bread, nuts, seeds, popcorn, and fiber supplements.   Tylenol is OK to use.   Arrange for a responsible adult (friend or family member) to take you home after the procedure.   Plan to have someone stay with you after the procedure.    2 days before your colonoscopy NO SOLID FOODS   Clear liquids all day.   Drink an extra 8 ounces of clear liquid every hour while awake.   4 pm: Empty 1 Pouch A and 1 Pouch B in the disposable container that is provided. Add lukewarm drinking water to the top line of the container and mix until dissolved.   The Moviprep container is divided by 4 marks: every 15 minutes, drink the prep solution  down to the  next mark until the full liter is consumed.   Drink 16 ounces of clear liquid of your choice. This is a necessary step to ensure adequate hydration and an effective prep.   9 pm: Repeat steps 4-6: Empty 1 Pouch A  and 1 Pouch B in the disposable container that is provided. Add lukewarm drinking water to the top line of the container and mix until dissolved.  Drink the prep solution down to the next mark every 15 minutes until the full liter is consumed.  Drink 16 ounces of clear liquid of your choice. This is a necessary step to ensure adequate hydration and effective prep.    The day before your colonoscopy NO SOLID FOODS   Clear liquids all day.   Drink an extra 8 ounces of clear liquid every hour while awake.   4 pm: Empty 1 Pouch A and 1 Pouch B in the disposable container that is provided. Add lukewarm drinking water to the top line of the container and mix until dissolved.   The Moviprep container is divided by 4 marks: every 15 minutes, drink the prep solution  down to the next mark until the full liter is consumed.   Drink 16 ounces of clear liquid of your choice. This is a necessary step to ensure adequate hydration and an effective prep.   You may apply a petroleum-based product or Went to the rectal area if you experience discomfort from frequent stores.   Confirm that you have a responsible adult to take you home.    The day of your colonoscopy  NO SOLID FOOD   You may drink clear liquids up to 4 hours before your procedure.   You may take your morning high blood pressure and heart medications.   6 hours before your procedure time: Empty 1 Pouch A and 1 Pouch B in the disposable container that is provided. Add lukewarm drinking water to the top line of the container and mix until dissolved. The Moviprep container is divided by 4 marks: every 15 minutes, drink the prep solution down to the next mark until the full liter is consumed.  Complete 2nd dose 4 hours prior to your scheduled start time. Do not  take anything by mouth 4 hours before your procedure. No gum or hard candy.   After the procedure you may eat your usual diet unless otherwise instructed. Drink 8 ounces of liquid at least 6 times after the procedure and before retiring for the night.        CLEAR LIQUID DIET    THESE ITEMS ARE ALLOWED:  Water   Clear broth: beef, chicken, vegetable   juices   Apple juice or cider   White grape juice, white cranberry juice   Tang  Lemonade   Sodas (clear in color)   Tea  Coffee (without cream)   Clear gelatin (without fruit)   Popsicles (without fruit or cream)  Italian ice  THESE ITEMS ARE NOT ALLOWED:  Milk   Cream   Milkshakes   Tomato juice   Orange juice   Cream soups   Any soup other than the listed broth   Oatmeal   Cream of wheat   Grapefruit juice   Alcohol       PLEASE NOTE: It is extremely important to follow the preparation listed above, so that the doctor will be able to see your entire colon. Your colon must be clear of any stool. Inadequate preparation limits the value of this procedure and could necessitate rescheduling the examination.     * If you need to reschedule or cancel your appointment, please call your GI physician's office.       FREQUENTLY ASKED QUESTIONS:    My procedure is in the afternoon. Can I eat in the morning?   A: No. To ensure your safety during the procedure, it is important that the stomach is empty. Any food or liquid in the stomach at the time of the procedure places you at risk of aspirating these contents into the lung leading to a serious complication called aspiration pneumonia. You can drink water until 4 hours before your procedure time.     I ate breakfast (lunch or dinner) the day before my colonoscopy. Is that okay?    A: If the preparation instructions were not followed properly, residual stool may remain in the colon and hide important findings from the examining physician. In some cases, if the colon preparation is not good, you may have to repeat the preparation  and the exam. If you accidentally eat any solid food the day before your exam, please call and ask to speak with a member of the nursing staff. You may be asked to reschedule your procedure.     I don't have a ride. Is that okay?   A: No. If you do not have a responsible adult to accompany you home, YOUR PROCEDURE WILL BE CANCELLED.    How many days prior to the procedure should I discontinue my Coumadin, Plavix or other blood thinning medications?   A: If you are taking any blood thinning medications please let your endoscopist know. Generally speaking, you should quit taking your blood thinning medication 2-7 days prior to some procedures depending on the medication; however, you must check with your endoscopist and your prescribing physician to ensure that it is needed and safe for you to do so.     What medications can I take the day before and the day of my procedure?  A: The day prior to your procedure take your medications the way you normally would. However, for those patients taking any type of  bowel cleansing preparation, be advised that you may undergo a prolonged period of diarrhea that may flush oral medications out of your system before they have time to take effect. The morning of your procedure you should take any blood pressure or heart medications you may be on with a small sip of water. You can hold most other medications and take them once your procedure has been completed. If you have questions about specific medication(s), please call a member of the endoscopy unit clinical staff.     I am diabetic. Do I take my insulin?   A: You must direct the question to the physician who placed you on this medication. Please check your blood sugar the morning of your procedure as you normally would. If you have any questions about your diabetes management in conjunction with your fast for your endoscopic procedure, please consult your primary physician.     I am on pain medication? Can I take it prior to my  procedure?   A: You may take your prescription pain medications prior to your procedure with a small sip of water. Please inform the pre-procedural clinical staff of any medications you've taken the day of your procedure. If you have any questions, please call a member of the endoscopy unit clinical staff.     I'm having a menstrual period. Should I reschedule my colonoscopy appointment?   A: No. Your menstrual period will not interfere with your physician's ability to complete your procedure.     May I continue taking my iron tablets?   A: No. Iron may cause the formation of dark color stools which can make it difficult for the physician to complete your colonoscopy if your preparation is less than optimal. We recommend you stop taking your oral iron supplements at least one week prior to your procedure.     I have been on Aspirin therapy for my heart. Should I continue to take it?   A: Aspirin may affect blood coagulation. Please check with your endoscopist and prescribing physician.     I am having a colonoscopy tomorrow. I started my colon preparation on time but now I am experiencing diarrhea and/or a bloating feeling. What should I do?   A: Nausea, vomiting and a sense of fullness or bloating can occur any time after beginning your colon preparation. However, it is important that you drink all the preparation. For most people, taking an hour break from the preparation will usually help. Then continue taking the preparation as ordered. If the vomiting returns or symptoms get worse, please call your GI physician's office.     If you have any questions or concerns, please don't hesitate to call.     Sincerely,    [facility] Gastroenterology Team

## 2022-11-07 NOTE — Telephone Encounter (Signed)
Pt presented to office for GI appt, was subsequently scheduled for endoscopy/colonoscopy with Dr Larena Glassman, and was told to stop Tradjenta and Jardiance 1 week prior to the procedures. She wanted to obtain approval to do this with her endo provider first. Please advise via [facility].

## 2022-11-07 NOTE — Progress Notes (Signed)
Helen Sale MD, [address] [place],  [address]  Appointments: [phone]        Reason for Consultation:   GERD  Anemia  History of colon polyps    Assessment and Plan:   GERD symptoms were discussed in detail with the patient. Typical and atypical manifestations were reviewed. Management includes diet and lifestyle modifications including avoidance of eating late, weight loss when indicated, avoidance of GERD trigger foods, elevation of head of bed, etc.  Patient will continue on PPI daily. PPI side effects were discussed in detail with the patient.     Anemia Hb as low as 10.5 in 2021, now 11.4. Microcytic.    Patient was referred for evaluation of anemia of unclear etiology.     We will proceed with endoscopy and colonoscopy    She will stop Jardiance and Trajenta 1 week before procedures    The upper endoscopy procedure was explained to the patient, including indication, alternatives, possible benefits, and potential risks (including but not limited to bleeding, infection, perforation, aspiration, anesthesia complications such as slowed breathing and lowered blood pressure). She appeared to understand and agrees to proceed.    The colonoscopy procedure was explained to the patient, including indication, alternatives, possible benefits, and potential risks (including but not limited to bleeding, infection, perforation, aspiration, splenic avulsion, anesthesia complications such as slowed breathing and lowered blood pressure). The patient appeared to understand and agrees to proceed.      History:   Helen [name] is a 77 y.o. female who presents to the office     Colonoscopy 2011: Internal hemorrhoids, diverticula, 2 polyps found and removed    She denies any fevers, chills, recent weight change, nausea, vomiting, dysphagia, odynophagia, change in appetite, diarrhea, constipation, hematochezia, melena or jaundice.    She notes GERD symptoms are well controled on  pantoprazole.    She does states she has frequent fatigue.    47 minutes was spent on this encounter.  This includes the time spent reviewing pathology and imaging reports prior to the encounter as well as time spent discussing the care with other physicians and also face to face time with the patient.       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    Bilateral cataracts     Breast cancer     Breast lump     Cancer     breast cancer    Carcinoma     Carcinoma of the bladder    Chicken pox     Childhood illness    Conduction Disorder     RBBB    Diabetes mellitus type II     Non-Insulin dependent    Echocardiogram 10/2013    Gastroesophageal reflux disease     Hemorrhoids without complication     Holter monitor 02/2014    Hyperlipidemia     Hypertensive disorder     Malignant neoplasm of breast     Measles     Childhood illness    Mumps     Childhood illness  Myocardial perfusion scan 11/2013, 12/2013    MPI single Isotope excrise; MPI dual Isotope Lexiscan    RBBB     Spinal stenosis     Syncope        Past Surgical History:     Past Surgical History:   Procedure Laterality Date    ABLATION OF DYSRHYTHMIC FOCUS      ARTHROSCOPY, KNEE Left 04/21/2015    Procedure: ARTHROSCOPY, KNEE PARTIAL MEDIAL AND LATERAL MENISECTOMY LEFT KNEE;  Surgeon: Benard Halsted, MD;  Location: [facility];  Service: Orthopedics;  Laterality: Left;  partial medial menisectomy    BREAST BIOPSY      HYSTERECTOMY      total    TUMOR REMOVAL Left     neck  benign        Family History:     Family History   Problem Relation Age of Onset    Diabetes Mother     Diabetes Sister     Diabetes Sister     Malignant hyperthermia Neg Hx     Pseudochol deficiency Neg Hx     Anesthesia problems Neg Hx         Social History:     Social History     Socioeconomic History    Marital status: Divorced   Tobacco Use    Smoking status: Never    Smokeless tobacco: Never   Vaping Use    Vaping Use: Never used   Substance and Sexual Activity    Alcohol use: No    Drug use: Never    Sexual activity: Not Currently     Social Determinants of Health     Financial Resource Strain: High Risk (10/16/2022)    Overall Financial Resource Strain (CARDIA)     Difficulty of Paying Living Expenses: Hard   Food Insecurity: Food Insecurity Present (10/16/2022)    Hunger Vital Sign     Worried About Running Out of Food in the Last Year: Often true     Ran Out of Food in the Last Year: Often true   Transportation Needs: No Transportation Needs (10/16/2022)    PRAPARE - Armed forces logistics/support/administrative officer (Medical): No     Lack of Transportation (Non-Medical): No   Physical Activity: Insufficiently Active (10/16/2022)    Exercise Vital Sign     Days of Exercise per Week: 3 days     Minutes of Exercise per Session: 30 min   Stress: No Stress Concern Present (10/16/2022)    [place]     Feeling of Stress : Not at all   Recent Concern: Stress - Stress Concern Present (09/06/2022)    [place]     Feeling of Stress : Very much   Social Connections: Moderately Integrated (10/16/2022)    Social Connection and Isolation Panel [NHANES]     Frequency of Communication with Friends and Family: Three times a week     Frequency of Social Gatherings with Friends and Family: Once a week     Attends Religious Services: More than 4 times per year     Active Member of [employer] or Organizations: Yes     Attends Archivist Meetings: Never     Marital Status: Divorced   Recent Concern: Social Connections - Moderately Isolated (09/06/2022)    Social Connection and Isolation Panel [NHANES]     Frequency of Communication with  Friends and Family: Three times a week     Frequency of Social Gatherings with Friends and Family: Twice a week     Attends Religious Services: More than 4 times per year     Active Member of [employer] or Organizations: No     Attends Archivist Meetings: Patient declined     Marital Status: Separated   Intimate Partner Violence: Not At Risk (10/16/2022)    Humiliation, Afraid, Rape, and Kick questionnaire     Fear of Current or Ex-Partner: No     Emotionally Abused: No     Physically Abused: No     Sexually Abused: No   Housing Stability: Low Risk  (10/16/2022)    Housing Stability Vital Sign     Unable to Pay for Housing in the Last Year: No     Number of [place] in the Last Year: 1     Unstable Housing in the Last Year: No        Allergies:     Allergies   Allergen Reactions    Aspirin      Break out in sweat and dizziness     Peppers     Pollen Extract     Latex Itching and Rash       Medications:     Current Outpatient Medications:     albuterol (PROVENTIL) (2.5 MG/3ML) 0.083% nebulizer solution, TAKE 3 MLS (2.5 MG TOTAL) BY NEBULIZATION EVERY 4 (FOUR) HOURS AS NEEDED FOR SHORTNESS OF BREATH OR WHEEZING, Disp: 75 mL, Rfl: 1    albuterol sulfate HFA (PROVENTIL) 108 (90 Base) MCG/ACT inhaler, INHALE 2 PUFFS INTO THE LUNGS EVERY 4 HOURS AS NEEDED FOR WHEEZING OR SHORTNESS OF BREATH., Disp: 6.7 each, Rfl: 2    amLODIPine (NORVASC) 10 MG tablet, TAKE 1 TABLET (10 MG) BY MOUTH DAILY., Disp: 90 tablet, Rfl: 3    atorvastatin (LIPITOR) 40 MG tablet, TAKE 1 TABLET BY MOUTH EVERY DAY, Disp: 90 tablet, Rfl: 1    benzonatate (TESSALON) 100 MG capsule, Take 1 capsule (100 mg) by mouth 3 (three) times daily as needed for Cough, Disp: 30 capsule, Rfl: 0    Blood Glucose Monitoring Suppl (ONE TOUCH ULTRA 2) W/DEVICE KIT, One daily, Disp: 1 each, Rfl: 0    Cholecalciferol (Vitamin D3) 2000 UNIT capsule, Take by mouth daily, Disp: , Rfl:     desonide (DESOWEN) 0.05 % cream, , Disp: , Rfl:     dexAMETHasone (DECADRON)  0.1 % ophthalmic solution, , Disp: , Rfl:     diclofenac Sodium (VOLTAREN) 1 % Gel topical gel, Apply 2 g topically 4 (four) times daily, Disp: 50 g, Rfl: 0    empagliflozin (Jardiance) 25 MG tablet, Take 1 tablet (25 mg) by mouth every morning, Disp: 90 tablet, Rfl: 1    ferrous sulfate 325 (65 FE) MG tablet, Take 1 tablet (325 mg) by mouth every morning with breakfast, Disp: , Rfl:     fluocinonide (LIDEX) 0.05 % gel, , Disp: , Rfl:     fluticasone (FLONASE) 50 MCG/ACT nasal spray, 2 sprays by Nasal route daily, Disp: , Rfl:     fluticasone (FLOVENT HFA) 220 MCG/ACT inhaler, INHALE 1 PUFF INTO THE LUNGS TWICE A DAY, Disp: 36 each, Rfl: 1    glipiZIDE (GLUCOTROL) 10 MG tablet, Take 2 tablets (20 mg) by mouth 2 (two) times daily before meals, Disp: 360 tablet, Rfl: 1    glucose blood (OneTouch Ultra) test strip, 2 times daily, Disp:  200 each, Rfl: 3    Lancets (onetouch ultrasoft) lancets, Use to check blood sugars 2 times daily, Disp: 200 each, Rfl: 3    lidocaine (Lidoderm) 5 %, Place 1 patch onto the skin daily as needed (pain) Remove & Discard patch within 12 hours, Disp: 30 patch, Rfl: 0    linaGLIPtin (Tradjenta) 5 MG Tab, Take 1 tablet (5 mg) by mouth daily, Disp: 90 tablet, Rfl: 1    metFORMIN (GLUCOPHAGE) 1000 MG tablet, Take 1 tablet (1,000 mg) by mouth 2 (two) times daily, Disp: 180 tablet, Rfl: 1    montelukast (SINGULAIR) 10 MG tablet, Take 1 tablet (10 mg) by mouth daily, Disp: , Rfl:     NASONEX 50 MCG/ACT nasal spray, 2 sprays by Nasal route daily as needed, Disp: , Rfl: 5    pantoprazole (PROTONIX) 40 MG tablet, TAKE 1 TABLET BY MOUTH EVERY DAY, Disp: 90 tablet, Rfl: 1    valsartan-hydroCHLOROthiazide (DIOVAN-HCT) 160-12.5 MG per tablet, Take 1 tablet by mouth daily, Disp: 90 tablet, Rfl: 3    Review of Systems:   Constitutional:No fever,chills,weight loss or gain.  Integument:No skin rashes or lesions.  Eyes:No changes in vision  ENMT:No changes in hearing,nasal discharge,sore throat,oral  lesions.  Respiratory:No shortness of breath or cough.  Cardiovascular:No chest pain or palpitations.  Genitourinary:No nocturia,urinary frequency, or dysuria.  Gastrointestinal:See HPI.  Musculoskeletal:No back or joint pain.  Neurologic:No migraine headaches,numbness, or tingling.  Endocrine:No polydipsia,cold or heat intolerance.  Hematologic:No bleeding or bruising.  Psychiatric:No depression or anxiety.     Physical Exam:   There were no vitals filed for this visit.     General appearance - Well developed and well nourished. Normal gait.  HEENT- Normocephalic. Eomi. Sclera anicteric. Normal hearing. Nares normal.  Oropharynx normal.  Neck - Supple.  No thyromegaly.  No cervical lymphadenopathy.  Chest - clear to percussion and auscultation.  No wheeze, rhonchi, or rales.  Heart - regular rate and rhythm without murmurs, gallops, or rubs.  Abdomen - normal bowel sounds.  No focal tenderness to palpation. No hepatosplenomegaly.  No masses. No ascites.  Rectal - deferred until time of colonoscopy.  Musculoskeletal - normal range of motion of arms and legs.  Extremities - no clubbing, cyanosis, or edema.  Skin - no rashes or lesions.  Neurologic - Alert and oriented to person, place and time.  No focal motor or sensory deficits.  No asterixis.  Recent and remote memory normal.

## 2022-11-09 NOTE — Telephone Encounter (Signed)
Pt called to follow up on the pre procedure prep of not taking medicine before the procedure. Rep advised the pt to read the message in my chart

## 2022-11-12 ENCOUNTER — Other Ambulatory Visit: Payer: Medicare Other

## 2022-11-13 ENCOUNTER — Other Ambulatory Visit: Payer: Medicare Other

## 2022-11-14 ENCOUNTER — Other Ambulatory Visit: Payer: Medicare Other

## 2022-11-15 LAB — COMPREHENSIVE METABOLIC PANEL
ALT: 19 IU/L (ref 0–32)
AST (SGOT): 23 IU/L (ref 0–40)
Albumin/Globulin Ratio: 1.5 (ref 1.2–2.2)
Albumin: 4 g/dL (ref 3.8–4.8)
Alkaline Phosphatase: 79 IU/L (ref 44–121)
BUN / Creatinine Ratio: 18 (ref 12–28)
BUN: 16 mg/dL (ref 8–27)
Bilirubin, Total: 0.8 mg/dL (ref 0.0–1.2)
CO2: 24 mmol/L (ref 20–29)
Calcium: 9.5 mg/dL (ref 8.7–10.3)
Chloride: 104 mmol/L (ref 96–106)
Creatinine: 0.87 mg/dL (ref 0.57–1.00)
Globulin, Total: 2.6 g/dL (ref 1.5–4.5)
Glucose: 115 mg/dL — ABNORMAL HIGH (ref 70–99)
Potassium: 4.5 mmol/L (ref 3.5–5.2)
Protein, Total: 6.6 g/dL (ref 6.0–8.5)
Sodium: 142 mmol/L (ref 134–144)
eGFR: 69 mL/min/{1.73_m2} (ref >59)

## 2022-11-15 LAB — LIPID PANEL
Cholesterol / HDL Ratio: 1.9 ratio (ref 0.0–4.4)
Cholesterol: 129 mg/dL (ref 100–199)
HDL: 67 mg/dL (ref >39)
LDL Chol Calculated (NIH): 51 mg/dL (ref 0–99)
Triglycerides: 47 mg/dL (ref 0–149)
VLDL Calculated: 11 mg/dL (ref 5–40)

## 2022-11-15 LAB — HEMOGLOBIN A1C: Hemoglobin A1C: 7.6 % — ABNORMAL HIGH (ref 4.8–5.6)

## 2022-11-16 ENCOUNTER — Telehealth (INDEPENDENT_AMBULATORY_CARE_PROVIDER_SITE_OTHER): Payer: Self-pay

## 2022-11-16 LAB — URINE MICROALBUMIN, RANDOM
Creatinine, UR: 37.9 mg/dL
Microalb/Crt. Ratio: 13 mg/g creat (ref 0–29)
Microalbumin, UR: 5 ug/mL

## 2022-11-16 NOTE — Telephone Encounter (Signed)
-----   Message from Angeline Slim, NP sent at 11/16/2022 12:16 PM EDT -----  Dear [name], your lab results indicate improving diabetes level and normal cholesterol and Lipid panel. Please continue your medications, follow diabetic controlled carbohydrate diet and do daily exercise.  We will discuss these results further at your upcoming appointment.

## 2022-11-16 NOTE — Telephone Encounter (Signed)
MA reviewed results and recommendations per NP Abbasi . Patient verbalized understanding and has no questions at this time regarding labs. However she requested Ms. Abbasi's advise regarding her Colonoscopy coming up and her Gertie Fey wants her to not to take tradjenta and jardiance for whole week. Patient is worried her sugar levels will go up. Please advise. Thank you

## 2022-11-19 DIAGNOSIS — Z1283 Encounter for screening for malignant neoplasm of skin: Secondary | ICD-10-CM | POA: Diagnosis not present

## 2022-11-19 DIAGNOSIS — D485 Neoplasm of uncertain behavior of skin: Secondary | ICD-10-CM | POA: Diagnosis not present

## 2022-11-19 DIAGNOSIS — X32XXXD Exposure to sunlight, subsequent encounter: Secondary | ICD-10-CM | POA: Diagnosis not present

## 2022-11-19 DIAGNOSIS — D2272 Melanocytic nevi of left lower limb, including hip: Secondary | ICD-10-CM | POA: Diagnosis not present

## 2022-11-19 DIAGNOSIS — D2271 Melanocytic nevi of right lower limb, including hip: Secondary | ICD-10-CM | POA: Diagnosis not present

## 2022-11-19 DIAGNOSIS — L570 Actinic keratosis: Secondary | ICD-10-CM | POA: Diagnosis not present

## 2022-11-19 DIAGNOSIS — D225 Melanocytic nevi of trunk: Secondary | ICD-10-CM | POA: Diagnosis not present

## 2022-11-22 NOTE — Telephone Encounter (Signed)
Spoke with the patient over the phone and shared NP's recommendations. Helen [name] verbalized understanding with no additional questions or concerns at this time.

## 2022-11-22 NOTE — Telephone Encounter (Signed)
Patient is calling back in regards to previous request. Patient states she is scheduled to have a colonoscopy and is requesting a callback from the provider or clinical team as to what she should do regarding stopping tradjenta and jardiance for whole week before her procedure per the gastroenterologist request.     Please assist.

## 2022-11-23 ENCOUNTER — Other Ambulatory Visit: Payer: Self-pay | Admitting: Family Medicine

## 2022-11-23 ENCOUNTER — Telehealth: Payer: Self-pay

## 2022-11-23 NOTE — Telephone Encounter (Signed)
It will not interfere.

## 2022-11-23 NOTE — Telephone Encounter (Signed)
Pt called and stated that she is having a colonoscopy on 12/07/22.  She is due to have her allergy shot (Xolair) on 11/28/22 and wants to make sure that this medication is not going to interfere with her procedure.    Please reach out to pt for clarification

## 2022-11-26 ENCOUNTER — Encounter: Payer: Self-pay | Admitting: Physical Medicine and Rehabilitation

## 2022-11-26 ENCOUNTER — Ambulatory Visit (INDEPENDENT_AMBULATORY_CARE_PROVIDER_SITE_OTHER): Payer: Medicare Other | Admitting: Physical Medicine and Rehabilitation

## 2022-11-26 ENCOUNTER — Other Ambulatory Visit (INDEPENDENT_AMBULATORY_CARE_PROVIDER_SITE_OTHER): Payer: Medicare Other

## 2022-11-26 DIAGNOSIS — M4316 Spondylolisthesis, lumbar region: Secondary | ICD-10-CM | POA: Diagnosis not present

## 2022-11-26 DIAGNOSIS — R1031 Right lower quadrant pain: Secondary | ICD-10-CM | POA: Diagnosis not present

## 2022-11-26 DIAGNOSIS — M47816 Spondylosis without myelopathy or radiculopathy, lumbar region: Secondary | ICD-10-CM | POA: Diagnosis not present

## 2022-11-26 DIAGNOSIS — M5416 Radiculopathy, lumbar region: Secondary | ICD-10-CM

## 2022-11-26 DIAGNOSIS — G8929 Other chronic pain: Secondary | ICD-10-CM

## 2022-11-26 NOTE — Progress Notes (Unsigned)
Functional Pain Scale - descriptive words and definitions  Distracting (5)    Aware of pain/able to complete some ADL's but limited by pain/sleep is affected and active distractions are only slightly useful. Moderate range order  Average Pain 8  Lower back pain on right side that goes across the lower back to the right and radiates into the groin on the right. Walking and standing makes pain worse

## 2022-11-26 NOTE — Telephone Encounter (Signed)
Patient called to follow up on med refill; stated she only has a few pills left. Patient requesting a courtesy refill for enough pill to last until her appt on 12/17/22 at 12:30pm.   Pharmacy confirmed as   [id number] [place], [place] - [address] [place] [address] Phone: [phone]  Fax: [phone] DEA #: [id number]   Please advise patient at [phone]

## 2022-11-26 NOTE — Telephone Encounter (Signed)
Requested medications are due for refill today.  Provider to determine  Requested medications are on the active medications list.  yes  Last refill. 08/06/2022 #30 0 rf  Future visit scheduled.   yes  Notes to clinic.  Refill not delegated.    Requested Prescriptions  Pending Prescriptions Disp Refills   diphenoxylate-atropine (LOMOTIL) 2.5-0.025 MG tablet [Pharmacy Med Name: DIPHENOXYLATE-ATROP 2.5-0.025] 30 tablet 0    Sig: Take 1 tablet by mouth 4 (four) times daily as needed for diarrhea or loose stools.     Not Delegated - Gastroenterology:  Antidiarrheals Failed - 11/23/2022  4:17 PM      Failed - This refill cannot be delegated      Failed - Valid encounter within last 12 months    Recent Outpatient Visits           1 year ago [place] Dennard Schaumann, Cammie Mcgee, MD   1 year ago [place] Dennard Schaumann Cammie Mcgee, MD   1 year ago Benign essential HTN   [place] Susy Frizzle, MD   1 year ago Closed fracture of right hip with routine healing, subsequent encounter   [place] Susy Frizzle, MD   2 years ago Complex cyst of left ovary   [place] Pickard, Cammie Mcgee, MD       Future Appointments             In 3 weeks Pickard, Cammie Mcgee, MD [place]

## 2022-11-26 NOTE — Progress Notes (Addendum)
April Beck - 77 y.o. female MRN [id number]  Date of birth: [birth year]  Office Visit Note: Visit Date: 11/26/2022 PCP: Donita Brooks, MD Referred by: Donita Brooks, MD  Subjective: Chief Complaint  Patient presents with   Lower Back - Pain   HPI: April Beck is a 77 y.o. female who comes in today as a self referral for evaluation of chronic, worsening and severe bilateral lower back pain, left greater than right. Intermittent pain radiating to right groin. History of lower back issues many years ago. Pain increased after mechanical fall in February. She was evaluated in the ED post fall, discharged home with Oxycodone. Pain worsens with walking and activity. She describes pain as sore, aching and stabbing sensation, currently rates as 8 out of 10. No relief of pain with rest and OTC medications. She is managed by Dr. Fuller Canada from an orthopedic standpoint. No previous imaging of lumbar spine. No history of lumbar injections. History of right hip hemiarthroplasty with Dr. Fuller Canada in 2022. Patient reports pain is keeping her from being active and causing difficulty performing daily tasks. She is ambulating with cane. Patient denies focal weakness, numbness and tingling. No recent falls within the last few weeks.    Oswestry Disability Index Score 46% 20 to 30 (60%) severe disability: Pain remains the main problem in this group but activities of daily living are affected. These patients require a detailed investigation.  Review of Systems  Musculoskeletal:  Positive for back pain.  Neurological:  Negative for tingling, sensory change, focal weakness and weakness.  All other systems reviewed and are negative.  Otherwise per HPI.  Assessment & Plan: Visit Diagnoses:    ICD-10-CM   1. Lumbar radiculopathy  M54.16 XR Lumbar Spine 2-3 Views    MR LUMBAR SPINE WO CONTRAST    2. Groin pain, chronic, right  R10.31 MR LUMBAR SPINE WO CONTRAST   G89.29     3.  Spondylolisthesis of lumbar region  M43.16 MR LUMBAR SPINE WO CONTRAST    4. Facet hypertrophy of lumbar region  M47.816 MR LUMBAR SPINE WO CONTRAST       Plan: Findings:  Chronic, worsening and severe bilateral lower back pain, left greater than right. Intermittent pain radiating to right groin. Patient continues to have severe pain despite good conservative therapies such as rest and use of OTC medications. Patients clinical presentation and exam are consistent with lumbar radiculopathy vs facet mediated pain. I did obtain 2 view lumbar x-rays in the office today that exhibit mild lumbar levoscoliosis,  grade 1 anterolisthesis noted at L4-L5 and disc height loss at L2-L3. Next step is to obtain lumbar MRI imaging. Depending on results of MRI imaging we did discuss possibility of performing lumbar epidural steroid injection vs facet joint injections. I do feel her pain is more nerve related, positive slump test on the right, however facet joints at L4-L5 can refer pain to groin region. I did explain injection procedure in detail today, she has no questions at this time. I will see her back for lumbar MRI review and to discuss treatment options. No red flag symptoms noted upon exam today.     Meds & Orders: No orders of the defined types were placed in this encounter.   Orders Placed This Encounter  Procedures   XR Lumbar Spine 2-3 Views   MR LUMBAR SPINE WO CONTRAST    Follow-up: Return for follow up for lumbar MRI review.   Procedures: No procedures  performed      Clinical History: No specialty comments available.   She reports that she has never smoked. She has never used smokeless tobacco. No results for input(s): "HGBA1C", "LABURIC" in the last 8760 hours.  Objective:  VS:  HT:    WT:   BMI:     BP:   HR: bpm  TEMP: ( )  RESP:  Physical Exam Vitals and nursing note reviewed.  HENT:     Head: Normocephalic and atraumatic.     Right Ear: External ear normal.     Left Ear:  External ear normal.     Nose: Nose normal.     Mouth/Throat:     Mouth: Mucous membranes are moist.  Eyes:     Extraocular Movements: Extraocular movements intact.  Cardiovascular:     Pulses: Normal pulses.  Pulmonary:     Effort: Pulmonary effort is normal.  Abdominal:     General: Abdomen is flat. There is no distension.  Musculoskeletal:        General: Tenderness present.     Cervical back: Normal range of motion.     Comments: Patient rises from seated position to standing without difficulty. Good lumbar range of motion. No pain noted with facet loading. 5/5 strength noted with bilateral hip flexion, knee flexion/extension, ankle dorsiflexion/plantarflexion and EHL. No clonus noted bilaterally. No pain upon palpation of greater trochanters. No pain with internal/external rotation of bilateral hips. Sensation intact bilaterally. Positive slump test on the left. Ambulates with cane, gait slow and unsteady.     Skin:    General: Skin is warm and dry.     Capillary Refill: Capillary refill takes less than 2 seconds.  Neurological:     Mental Status: She is alert and oriented to person, place, and time.  Psychiatric:        Mood and Affect: Mood normal.        Behavior: Behavior normal.     Ortho Exam  Imaging: No results found.  Past Medical/Family/Surgical/Social History: Medications & Allergies reviewed per EMR, new medications updated. 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 01/16/2022   Closed fracture of left distal radius 10/31/2021   Ovarian cyst 01/16/2021   Chronic constipation 11/21/2020   CKD (chronic kidney disease) stage 3, GFR 30-59 ml/min 11/21/2020   Herpes 11/21/2020   Hypokalemia 11/21/2020   Acute blood loss anemia 11/21/2020   Weakness 11/17/2020   Aspiration into airway    Fall    Closed fracture of right hip 11/12/2020   Dysphagia, unspecified(787.20) 06/14/2013   Heel pain  06/14/2013   Hypercholesteremia    Hypertension    GERD (gastroesophageal reflux disease)    Hypothyroidism    Renal insufficiency 06/19/2012   Past Medical History:  Diagnosis Date   Anxiety    hx of panic attack   Arthritis    hands & knees   CKD (chronic kidney disease), stage III    followed by pcp   Closed fracture of left distal radius 10/31/2021   Edema of both lower extremities    GERD (gastroesophageal reflux disease)    History of 2019 novel coronavirus disease (COVID-19) 09/17/2020   positive result in epic,  per pt very mild symtpoms that resolved   History of uterine fibroid    Hypertension    followed by pcp   Hypomagnesemia    Hypothyroidism    followed by pcp  IDA (iron deficiency anemia)    Osteopenia    Ovarian cyst    Pelvic pain    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, takes meloxicam   Wears contact lenses    Family History  Problem Relation Age of Onset   Colon polyps Brother    Breast cancer Maternal Grandmother    Diabetes Brother    Hypertension Brother    Colon cancer Neg Hx    Esophageal cancer Neg Hx    Rectal cancer Neg Hx    Stomach cancer Neg Hx    Past Surgical History:  Procedure Laterality Date   ABDOMINAL HYSTERECTOMY  1982   CATARACT EXTRACTION Bilateral    CHOLECYSTECTOMY  06/20/2012   Procedure: LAPAROSCOPIC CHOLECYSTECTOMY;  Surgeon: Liz Malady, MD;  Location: [facility];  Service: General;  Laterality: N/A;   COLONOSCOPY  last one 03-20-2019  dr Christella Hartigan   DIAGNOSTIC LAPAROSCOPY  yrs ago   HIP ARTHROPLASTY Right 11/13/2020   Procedure: ARTHROPLASTY BIPOLAR HIP (HEMIARTHROPLASTY);  Surgeon: Vickki Hearing, MD;  Location: [facility];  Service: Orthopedics;  Laterality: Right;   INCONTINENCE SURGERY  09-11-2005   dr Annabell Howells  @[facility]    LYNX SLING   LAPAROSCOPIC SALPINGO OOPHERECTOMY Bilateral 01/16/2021   Procedure: DIAGNOSTIC LAPAROSCOPY;  Surgeon: Richarda Overlie, MD;  [facility];  Service: Gynecology;  Laterality: Bilateral;   LAPAROSCOPY Bilateral 01/16/2021   Procedure: EXPLORATORY LAPAROTOMY BILATERAL SALPINGO OOPHORECTOMY;  Surgeon: Richarda Overlie, MD;  Location[facility];  Service: Gynecology;  Laterality: Bilateral;   TUBAL LIGATION     Social History   Occupational History   Not on file  Tobacco Use   Smoking status: Never   Smokeless tobacco: Never  Vaping Use   Vaping Use: Never used  Substance and Sexual Activity   Alcohol use: No   Drug use: Never   Sexual activity: Not on file

## 2022-11-27 NOTE — Progress Notes (Signed)
   11/27/22 [place] in the past year? 0  Number of falls in past year 0  Was there an injury with Fall? 0  Fall Risk Category Calculator 0  Fall Risk  Patient at Risk for Falls Due to No Fall Risks  Fall risk Follow up Education provided;Falls evaluation completed;Falls prevention discussed

## 2022-11-27 NOTE — Telephone Encounter (Signed)
Pt verbalized understanding.

## 2022-11-27 NOTE — Telephone Encounter (Signed)
STP advising what the provider stated.

## 2022-11-29 ENCOUNTER — Ambulatory Visit (INDEPENDENT_AMBULATORY_CARE_PROVIDER_SITE_OTHER): Payer: Self-pay

## 2022-11-29 ENCOUNTER — Ambulatory Visit (INDEPENDENT_AMBULATORY_CARE_PROVIDER_SITE_OTHER): Payer: Medicare Other

## 2022-11-29 DIAGNOSIS — I10 Essential (primary) hypertension: Secondary | ICD-10-CM

## 2022-11-29 DIAGNOSIS — E119 Type 2 diabetes mellitus without complications: Secondary | ICD-10-CM

## 2022-11-30 ENCOUNTER — Encounter (INDEPENDENT_AMBULATORY_CARE_PROVIDER_SITE_OTHER): Payer: Self-pay

## 2022-11-30 ENCOUNTER — Other Ambulatory Visit: Payer: Self-pay | Admitting: Gastroenterology

## 2022-11-30 DIAGNOSIS — D649 Anemia, unspecified: Secondary | ICD-10-CM

## 2022-11-30 DIAGNOSIS — K219 Gastro-esophageal reflux disease without esophagitis: Secondary | ICD-10-CM | POA: Insufficient documentation

## 2022-11-30 DIAGNOSIS — Z86010 Personal history of colonic polyps: Secondary | ICD-10-CM | POA: Insufficient documentation

## 2022-12-05 ENCOUNTER — Telehealth (INDEPENDENT_AMBULATORY_CARE_PROVIDER_SITE_OTHER): Payer: Medicare Other | Admitting: Endocrinology, Diabetes and Metabolism

## 2022-12-06 ENCOUNTER — Encounter (INDEPENDENT_AMBULATORY_CARE_PROVIDER_SITE_OTHER): Payer: Self-pay | Admitting: Family

## 2022-12-07 ENCOUNTER — Ambulatory Visit
Admission: RE | Admit: 2022-12-07 | Discharge: 2022-12-07 | Disposition: A | Discharge status: Home / Self Care | Payer: Medicare Other | Source: Ambulatory Visit | Attending: Gastroenterology | Admitting: Gastroenterology

## 2022-12-07 ENCOUNTER — Encounter: Payer: Self-pay | Admitting: Gastroenterology

## 2022-12-07 ENCOUNTER — Encounter
Admission: RE | Disposition: A | Discharge status: Home / Self Care | Payer: Self-pay | Source: Ambulatory Visit | Attending: Gastroenterology

## 2022-12-07 ENCOUNTER — Ambulatory Visit: Payer: Medicare Other | Admitting: Certified Registered"

## 2022-12-07 ENCOUNTER — Ambulatory Visit: Payer: Self-pay

## 2022-12-07 DIAGNOSIS — K5730 Diverticulosis of large intestine without perforation or abscess without bleeding: Secondary | ICD-10-CM | POA: Insufficient documentation

## 2022-12-07 DIAGNOSIS — I10 Essential (primary) hypertension: Secondary | ICD-10-CM | POA: Insufficient documentation

## 2022-12-07 DIAGNOSIS — K317 Polyp of stomach and duodenum: Secondary | ICD-10-CM | POA: Insufficient documentation

## 2022-12-07 DIAGNOSIS — Z853 Personal history of malignant neoplasm of breast: Secondary | ICD-10-CM | POA: Insufficient documentation

## 2022-12-07 DIAGNOSIS — R12 Heartburn: Secondary | ICD-10-CM

## 2022-12-07 DIAGNOSIS — K3189 Other diseases of stomach and duodenum: Secondary | ICD-10-CM | POA: Insufficient documentation

## 2022-12-07 DIAGNOSIS — D649 Anemia, unspecified: Secondary | ICD-10-CM

## 2022-12-07 DIAGNOSIS — Z86010 Personal history of colonic polyps: Secondary | ICD-10-CM | POA: Insufficient documentation

## 2022-12-07 DIAGNOSIS — K621 Rectal polyp: Secondary | ICD-10-CM | POA: Insufficient documentation

## 2022-12-07 DIAGNOSIS — K219 Gastro-esophageal reflux disease without esophagitis: Secondary | ICD-10-CM | POA: Insufficient documentation

## 2022-12-07 DIAGNOSIS — J45909 Unspecified asthma, uncomplicated: Secondary | ICD-10-CM | POA: Insufficient documentation

## 2022-12-07 DIAGNOSIS — K641 Second degree hemorrhoids: Secondary | ICD-10-CM | POA: Insufficient documentation

## 2022-12-07 DIAGNOSIS — E119 Type 2 diabetes mellitus without complications: Secondary | ICD-10-CM | POA: Insufficient documentation

## 2022-12-07 HISTORY — PX: EGD: SHX3789

## 2022-12-07 HISTORY — PX: COLONOSCOPY, SCREENING: SHX00150

## 2022-12-07 LAB — WHOLE BLOOD GLUCOSE POCT: Whole Blood Glucose POCT: 147 mg/dL — ABNORMAL HIGH (ref 70–100)

## 2022-12-07 SURGERY — DONT USE, USE 1095-ESOPHAGOGASTRODUODENOSCOPY (EGD), DIAGNOSTIC
Anesthesia: Anesthesia General | Site: Esophagus

## 2022-12-07 MED ORDER — PROPOFOL INFUSION 10 MG/ML
INTRAVENOUS | Status: DC | PRN
Start: 2022-12-07 — End: 2022-12-07
  Administered 2022-12-07: 150 ug/kg/min via INTRAVENOUS

## 2022-12-07 MED ORDER — PROPOFOL 10 MG/ML IV EMUL (WRAP)
INTRAVENOUS | Status: DC | PRN
Start: 2022-12-07 — End: 2022-12-07
  Administered 2022-12-07: 100 mg via INTRAVENOUS

## 2022-12-07 MED ORDER — BENZOCAINE 20% MT SOLN (WRAP)
OROMUCOSAL | Status: DC | PRN
Start: 2022-12-07 — End: 2022-12-07
  Administered 2022-12-07: 1 via OROMUCOSAL

## 2022-12-07 MED ORDER — PROPOFOL 10 MG/ML IV EMUL (WRAP)
INTRAVENOUS | Status: AC
Start: 2022-12-07 — End: ?
  Filled 2022-12-07: qty 50

## 2022-12-07 MED ORDER — LACTATED RINGERS IV SOLN
INTRAVENOUS | Status: DC | PRN
Start: 2022-12-07 — End: 2022-12-07

## 2022-12-07 SURGICAL SUPPLY — 39 items
BLOCK BITE OD60 FR BLOX (Other) ×2 IMPLANT
CANNULA NASAL OD6 MM ID22 MM L7 FT PEDIATRIC DIVIDE O2 CO2 LINE MALE (Cannula) ×2 IMPLANT
CANNULA NSL 6MM 22MM 7FT LF DIV O2 CO2 (Cannula) ×2 IMPLANT
CAP ENDOSCOPE SEALING HYDRA OLYMPUS L16 (GE Lab Supplies) ×2 IMPLANT
CAP ENDOSCOPE SEALING HYDRA OLYMPUS L16 IN L18IN WATER BTTL CO2 CLIP (GE Lab Supplies) ×2 IMPLANT
CLIP HEMOSTATIC L235 CM OD2.8 MM BRAID (Clip) ×2 IMPLANT
CLIP HEMOSTATIC L235 CM OD2.8 MM BRAID CATHETER ROTATION CONTROL KNOB (Clip) IMPLANT
CONTAINER HST 10% NTRL BF FRMLN PP 45ML (Solution) ×6 IMPLANT
CUFF BLOOD PRESSURE ADULT 2T CLICK (Cuff) ×2 IMPLANT
ELECTRODE ADULT PATIENT RETURN L9 FT REM POLYHESIVE ACRYLIC FOAM (Procedure Accessories) IMPLANT
ELECTRODE PATIENT RETURN L9 FT VALLEYLAB (Procedure Accessories) IMPLANT
FORCEPS BIOPSY L240 CM STANDARD CAPACITY (Disposable Instruments) ×2 IMPLANT
FORCEPS BIOPSY L240 CM STANDARD CAPACITY NEEDLE OD2.2 MM RADIAL JAW (Disposable Instruments) IMPLANT
GLOVE EXAM LARGE NITRILE CHEMOTHERAPY POWDER FREE SENSE OATMEAL (Glove) ×2 IMPLANT
GLOVE EXAM LARGE NITRILE POWDER FREE SENSE OATMEAL (Glove) ×2 IMPLANT
GLOVE EXAM NITRILE RESTORE LG (Glove) ×2 IMPLANT
GLV EXAM NITRILE RESTORE LG (Glove) ×2
GOWN ISOLATION REGULAR LARGE LEVEL 2 (Gown) ×4 IMPLANT
GOWN ISOLATION REGULAR LARGE LEVEL 2 FULL BACK OVERHEAD THUMB HOOK SMS (Gown) ×4 IMPLANT
GOWN ISOLATION RGLR LG LVL 2 FLL BCK OVERHEAD THMB HK MEDLN SMS YELLOW (Gown) ×4 IMPLANT
KIT ENDOSCOPIC L6 FT CLEAN ADAPTER (Procedure Accessories) ×2 IMPLANT
KIT ENDOSCOPIC L6 FT CLEAN ADAPTER COMPLIANCE ENDOKIT ORCAPOD 4 1.1 OZ (Procedure Accessories) ×2 IMPLANT
MANIFOLD SUCTION 2 STANDARD 4 PORT (Filter) ×2 IMPLANT
MANIFOLD SUCTION 2 STANDARD 4 PORT NEPTUNE 2 WASTE MANAGEMENT SYSTEM (Filter) ×2 IMPLANT
OXIMETER PULSE SPO2 OLED DISPLAY SOFT (Procedure Accessories) ×2 IMPLANT
OXIMETER PULSE SPO2 OLED DISPLAY SOFT PAD PLETHYSMOGRAPH ADULT (Procedure Accessories) ×2 IMPLANT
RESUSCITATOR MANUAL ADULT MEDIUM O2 (Other) ×2 IMPLANT
RESUSCITATOR MANUAL ADULT MEDIUM O2 RESERVOIR BAG MASK SPUR II (Other) ×2 IMPLANT
SNARE ESCP RND CPTVTR 2.4MM 240CM CLD (Procedure Accessories) ×2 IMPLANT
SNARE OD10 MM CAPTIVATOR ENDOSCOPIC POLYPECTOMY (Procedure Accessories) IMPLANT
SPONGE GAUZE L4 IN X W4 IN 4 PLY (Procedure Accessories) ×2 IMPLANT
SPONGE GAUZE L4 IN X W4 IN 4 PLY ABSORBENT RAYON POLYESTER (Procedure Accessories) ×2 IMPLANT
SYRINGE CATHETER TIP GRADUATED (Syringes, Needles) ×2 IMPLANT
SYRINGE CATHETER TIP GRADUATED NONPYROGENIC DEHP FREE PVC FREE 50 ML (Syringes, Needles) ×2 IMPLANT
TRAP SPECIMEN POLYPROPYLENE (Endoscopic Supplies) IMPLANT
TUBING IRRIGATION HYDRA (Tubing) ×2 IMPLANT
WATER STERILE PLASTIC POUR BOTTLE 1000 (Irrigation Solutions) ×2 IMPLANT
WATER STERILE PLASTIC POUR BOTTLE 1000 ML (Irrigation Solutions) ×2 IMPLANT
WATER STERILE PLASTIC POUR BOTTLE 500 ML (Irrigation Solutions) ×2 IMPLANT

## 2022-12-07 NOTE — Telephone Encounter (Signed)
Please advise 

## 2022-12-07 NOTE — Discharge Instructions (Signed)
Hemorrhoids    Hemorrhoids are swollen and inflamed veins inside the rectum and near the anus. The rectum is the last several inches of the colon. The anus is the passage between the rectum and the outside of the body.   Causes  The veins can become swollen because of increased pressure in them. This is most often caused by:   Long-term (chronic) constipation or diarrhea  Straining when having a bowel movement  Sitting too long on the toilet  A low-fiber diet  Pregnancy  Weakening of supporting tissues of the anus and rectum from aging  Symptoms  Bleeding from the rectum. You may notice this after bowel movements. If you see blood from bowel movements, talk with your provider. They can determine if it's likely to be hemorrhoids, or if you need more assessment.  Lump near the anus  Itching around the anus  Pain around the anus  Mucus leaks from the anus  There are different types of hemorrhoids. Depending on the type you have and the severity, you may be able to treat yourself at home. In some cases, certain procedures may be the best treatment options. Your healthcare provider can tell you more about this, if needed.   Home care  General care  To get relief from pain or itching, try:  Medicines. Your healthcare provider may recommend stool softeners, suppositories, or laxatives to help manage constipation. Use these exactly as directed.  Sitz baths. A sitz bath involves sitting in a few inches of warm bath water. Be careful not to make the water so hot that you burn yourself--test it before sitting in it. Soak for about 10 to 15 minutes a few times a day. This may help relieve pain.  Topical products. Your healthcare provider may prescribe or recommend creams, ointments, or pads that can be applied to the hemorrhoid. Use these exactly as directed.  Tips to help prevent hemorrhoids   Eat more fiber. Fiber adds bulk to stool and absorbs water as it moves through your colon. This makes stool softer and easier to  pass.  Increase the fiber in your diet with more fiber-rich foods. These include fresh fruit, vegetables, and whole grains.  Take a fiber supplement or bulking agent, if advised by your healthcare provider. These include products such as psyllium or methylcellulose.  Drink more water. Your healthcare provider may direct you to drink plenty of water. This can help keep stool soft.  Be more active. Frequent exercise aids digestion and helps prevent constipation. It may also help make bowel movements more regular.  Don't strain during bowel movements. This can make hemorrhoids more likely. Also, don't sit on the toilet for long periods of time.    Follow-up care  Follow up with your healthcare provider as advised. If a culture or imaging tests were done, someone will let you know the results when they are ready. This may take a few days or longer. If your healthcare provider recommends a procedure for your hemorrhoids, these options can be discussed. Options may include surgery and outpatient office treatments.   When to seek medical advice  Call your healthcare provider right away if any of these occur:   Increased bleeding from the rectum  Increased pain around the rectum or anus  Weakness or dizziness  Call 911  Call 911 if any of these occur:   Trouble breathing or swallowing  Fainting or loss of consciousness  Unusually fast heart rate  Vomiting blood  Large amounts of   blood in stool or black, tarry stools  [employer] last reviewed this educational content on 09/03/2020   2000-2023 [employer]. All rights reserved. This information is not intended as a substitute for professional medical care. Always follow your healthcare professional's instructions.        Diverticulosis    Diverticulosis means that small pouches have formed in the wall of your large intestine (colon). Most often, this problem causes no symptoms and is common as people age. But the pouches in the colon are at risk of becoming  infected. When this happens, the condition is called diverticulitis. Although most people with diverticulosis never develop diverticulitis, it's still not uncommon. Rectal bleeding can also occur and in less common situations, a type of colon inflammation called colitis.   Most people don't have symptoms,. But some people with diverticulosis may have:   Belly (abdominal) cramps and pain  Bloating  Constipation  Change in bowel habits  Causes  The exact cause of diverticulosis (and diverticulitis) has not been proved, but a few things are linked with the condition:   Low-fiber diet. But some experts don't agree about this.  Constipation  Lack of exercise  Your healthcare provider will talk with you about how to manage your condition. Diet changes may be all that you need to help control diverticulosis and prevent its becoming diverticulitis. If you develop diverticulitis, you will likely need other treatments.   Home care  You may be told to take fiber supplements daily. Fiber adds bulk to the stool so that it passes through the colon more easily. Stool softeners may also be recommended. You may also be given medicines for pain relief. Be sure to take all medicines as directed.   In the past, people were told to not have corn, nuts, or seeds. You no longer need to do this.   Follow these guidelines when caring for yourself at home:  Eat unprocessed foods that are high in fiber. Whole grains, fruits, and vegetables are good choices.  Drink 6 to 8 glasses of water every day unless your healthcare provider has you limit how much fluid you should have.  Watch for changes in your bowel movements. Tell your provider if you notice any changes.  Begin an exercise program. Ask your provider how to get started. Generally, walking is the best.  Get plenty of rest and sleep.  Follow-up care  Follow up with your healthcare provider, or as advised. You may need regular visits to check on your health. Sometimes you may need special  procedures such as colonoscopy after an episode of diverticulitis or blooding. Be sure to keep all your appointments.   If a stool sample was taken, or cultures were done, you'll be told if they are positive, or if your treatment needs to be changed. You can call as directed for the results.   If X-rays were done, you'll be told of any new findings that may affect your care.   If antibiotics were prescribed, be sure to finish them all.  When to seek medical advice  Call your healthcare provider right away if any of these occur:  Fever of 100.4F (38C) or higher , or as directed by your healthcare provider  Severe cramps in the lower left side of the belly (abdomen) or pain that's getting worse  Tenderness in the lower left side of the abdomen or pain throughout the abdomen that gets worse  Diarrhea or constipation that doesn't get better within 24 hours    Nausea and vomiting  Slight bleeding from the rectum  Call 911  Call 911 if any of the following occur:   Large amount of bleeding from the rectum  Trouble breathing  Confusion  Very drowsy or trouble awakening  Fainting or loss of consciousness  Rapid heart rate  Chest pain  [employer] last reviewed this educational content on 09/03/2020   2000-2023 [employer]. All rights reserved. This information is not intended as a substitute for professional medical care. Always follow your healthcare professional's instructions.        Understanding Colon and Rectal Polyps  The colon (also called the large intestine) is a muscular tube that forms the last part of the digestive tract. It absorbs water and stores food waste. The colon is about 4 to 6 feet long. The rectum is the last 6 inches of the colon. The colon and rectum have a smooth lining composed of millions of cells. Changes in these cells can lead to growths called polyps in the colon. These can become cancerous and should be removed. Many tests are available to screen for colon cancer. But colonoscopy  is the only test that looks directly into the entire large intestine and allows for treatment right away. During colonoscopy, these polyps can be removed. How often you need this test depends on many things. These include your condition, your family history, symptoms, and what the findings were at the previous colonoscopy.    When the colon lining changes  Changes that happen in the cells that line the colon or rectum can lead to growths called polyps. Over a period of years, polyps can turn into cancer. Removing polyps early may prevent cancer from ever forming.   Polyps  Polyps are fleshy clumps of tissue that form on the lining of the colon or rectum. Small polyps are usually not cancer (benign). But over time, cells in a polyp can change and become precancerous. Certain types of polyps known as adenomatous polyps and serrated polyps are precancerous. The risk for cancer also increases with the size of the polyp and certain cell and gene features. This means that they may become cancerous if they're not removed. Hyperplastic polyps are benign. They can grow quite large and not turn cancerous.      Cancer  Almost all colorectal cancers start when polyp cells start growing abnormally. As a cancerous tumor grows, it may affect more and more of the colon or rectum. In time, cancer can also grow beyond the colon or rectum and spread to nearby organs or to glands called lymph nodes. The cells can also travel to other parts of the body. This is known as metastasis. The earlier a cancerous tumor is removed, the better the chance of preventing its spread.     [employer] last reviewed this educational content on 06/03/2020   2000-2023 [employer]. All rights reserved. This information is not intended as a substitute for professional medical care. Always follow your healthcare professional's instructions.        Gastritis (Adult)  Gastritis is inflammation and irritation of the stomach lining. You can have it  for a short time (acute) or it can be long lasting (chronic). Infection with bacteria called H. pylori most often causes gastritis. More than 1 out of 3 people in the [place] have these bacteria in their bodies. In many cases, H. pylori causes no problems or symptoms. But in some people, the infection irritates the stomach lining and causes gastritis.   H. pylori may be diagnosed through blood, stool, or breath tests, or by a biopsy during an endoscopy. Other causes of stomach irritation include drinking alcohol, smoking or chewing tobacco, or taking pain-relieving medicines called nonsteroidal anti-inflammatory drugs (NSAIDs), such as aspirin or ibuprofen. Some illegal drugs (such as cocaine) and immune conditions can also cause gastritis.     Symptoms of gastritis can include:  Belly pain or bloating  Feeling full quickly  Loss of appetite  Weight loss  Nausea or vomiting  Vomiting blood or having black stools  Feeling more tired than normal  An inflamed and irritated stomach lining is more likely to develop a sore called an ulcer. To help prevent this, gastritis should be evaluated and treated as soon as symptoms occur.   Home care  If needed, your healthcare provider may prescribe medicines. If you have H. pylori infection, treating it will likely ease your symptoms. Other changes can help reduce stomach irritation and help it heal.   Take prescription medicines as directed. If you have been prescribed medicines for H. pylori infection, take them as directed. Take all of the medicine until it's finished or until your provider tells you to stop taking it, even if you start to feel better.  Follow your healthcare provider's advice on NSAIDs. Your provider may advise you not to take NSAIDs such as ibuprofen. If you take daily aspirin for your heart or other health reasons, don't stop without talking with your provider first.  Don't drink alcohol. If you need help stopping your use of alcohol, ask your provider for  treatment resources.  Stop smoking. Smoking can irritate the stomach and delay healing. As much as possible, stay away from secondhand smoke. If you smoke and have trouble stopping, ask your provider for help.  Follow-up care  Follow up with your healthcare provider, or as advised. You may need testing to check for inflammation or an ulcer.   When to get medical advice  Call your healthcare provider if any of the following occur:   Stomach pain that gets worse or moves to the lower right belly (appendix area)  Chest pain that suddenly appears or gets worse, or spreads to the back, neck, shoulder, or arm  Frequent vomiting (can't keep down liquids)  Blood in the stool or vomit (red or black in color)  Feeling weak or dizzy  Shortness of breath  Unexplained weight loss  Fever of 100.4F (38C) or higher, or as directed by your healthcare provider  Symptoms that get worse, or new symptoms  [employer] last reviewed this educational content on 09/03/2021   2000-2023 [employer]. All rights reserved. This information is not intended as a substitute for professional medical care. Always follow your healthcare professional's instructions.

## 2022-12-07 NOTE — Anesthesia Preprocedure Evaluation (Signed)
Anesthesia Evaluation    AIRWAY    Mallampati: I    TM distance: >3 FB  Neck ROM: full  Mouth Opening:full   CARDIOVASCULAR    cardiovascular exam normal       DENTAL                   PULMONARY    clear to auscultation     OTHER FINDINGS    Denies loose teeth                                    Relevant Problems   No relevant active problems               Anesthesia Plan    ASA 2     general               (HTN  HL  Asthma - denies current symptoms  GERD - denies current symptoms  Breast cancer - in remission  DM - stopped Jardiance 1 week ago    2015: ECHO EF 55-65%  2016: normal myocardial perfusion    Denies hx of anesthesia complications)      intravenous induction   Detailed anesthesia plan: general IV        Post op pain management: per surgeon and PO analgesics    informed consent obtained    Plan discussed with CRNA.                     Signed by: Joaquim Lai, MD 12/07/22 9:05 AM

## 2022-12-07 NOTE — Discharge Instr - AVS First Page (Addendum)
Instructions for after your discharge:General Instructions: Dr. Szary [phone]    Following sedation, your judgment, perception, and coordination are considered impaired. Even though you may feel awake and alert, you are considered legally intoxicated. Therefore, until the next morning:  Do not drive  Do not operate appliances or equipment that requires reaction time (e.g. stove, electrical tools, machinery)  Do not sign legal documents or be involved in important decisions  Do not smoke if alone  Do not drink alcoholic beverages    Go directly home and rest for several hours before resuming your routine activities  It is highly recommended to have a responsible adult stay with you for the next 24 hours    Tenderness, swelling, or pain may occur at the IV site where you received sedation. If you experience this, apply warm soaks to the area. Notify your physician if this persists.   If you have questions or problems contact your MD immediately. If you need immediate attention, call your MD, 911 and/or go to the nearest emergency room.       Report to the Physician any of the following:    For Upper Endoscopy, ERCP, EUS, or Dilations:  Pain in chest  Nausea/vomiting  Fever/chills within 24 hours after procedure. Temp > 101 degrees Farenheit  Severe and persistent abdominal pain or bloating  * Mild throat soreness may follow this procedure. Warm salt water gargling, lozenges, or cold drink and popscicles of your choice will most likely relieve your discomfort      For Colonoscopy, Sigmoidoscopy, or Proctoscopy:  Severe and persistent abdominal pain/bloating which does not subside within 2-3 hours  Large amount of rectal bleeding (some mucous blood streaking may occur, especially if biopsy or polypectomy was done or if hemorrhoids are present  Nausea/vomiting  Fever/chills within 24 hours after procedure. Temp > 101 degrees Farenheit  * If polyp has been removed, DO NOT take aspirin or aspirin-containing  products (e.g. Anacin, Alka Seltzer, Bufferin, etc) or non-steroidal anti-inflammatory drugs (e.g. Advil, Motrin, etc) for 2 days unless otherwise advised by doctor. Tylenol or Extra Strength Tylenol is permitted.   *Special instructions: Follow MD instructions on procedural report.       Upper GI        Lower GI

## 2022-12-07 NOTE — Anesthesia Postprocedure Evaluation (Signed)
Anesthesia Post Evaluation    Patient: Helen [name]    Procedure(s):  EGD  COLONOSCOPY, SCREENING    Anesthesia type: general    Last Vitals:   Vitals Value Taken Time   BP 127/57 12/07/22 1045   Temp 37 C (98.6 F) 12/07/22 1007   Pulse 71 12/07/22 1045   Resp 22 12/07/22 1045   SpO2 100 % 12/07/22 1045                 Anesthesia Post Evaluation:     Patient Evaluated: PACU  Patient Participation: complete - patient participated  Level of Consciousness: awake    Pain Management: adequate  Multimodal analgesia pain management approach    Airway Patency: patent        Anesthetic complications: No      PONV Status: none    Cardiovascular status: acceptable  Respiratory status: acceptable  Hydration status: acceptable          Signed by: Joaquim Lai, MD, 12/07/2022 2:58 PM

## 2022-12-07 NOTE — Transfer of Care (Signed)
Anesthesia Transfer of Care Note    Patient: Helen [name]    Procedures performed: Procedure(s):  EGD  COLONOSCOPY, SCREENING    Anesthesia type: General TIVA    Patient location:Phase II PACU    Last vitals:   Vitals:    12/07/22 1007   BP: 112/60   Pulse: 79   Resp: 15   Temp: 37 C (98.6 F)   SpO2: 100%       Post pain: Patient not complaining of pain, continue current therapy      Mental Status:awake and alert     Respiratory Function: tolerating room air    Cardiovascular: stable    Nausea/Vomiting: patient not complaining of nausea or vomiting    Hydration Status: adequate    Post assessment: no apparent anesthetic complications, no reportable events, and no evidence of recall    Signed by: Ardyth Man, CRNA  12/07/22 10:08 AM

## 2022-12-07 NOTE — H&P (Signed)
Jule Economy MD, MS.  [address] 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    Bilateral cataracts     Breast cancer     Breast lump     Cancer     breast cancer    Carcinoma     Carcinoma of the bladder    Chicken pox     Childhood illness    Conduction Disorder     RBBB    Diabetes mellitus type II     Non-Insulin dependent    Echocardiogram 10/2013    Gastroesophageal reflux disease     Hemorrhoids without complication     Holter monitor 02/2014    Hyperlipidemia     Hypertensive disorder     Malignant neoplasm of breast     Measles     Childhood illness    Mumps     Childhood illness    Myocardial perfusion scan 11/2013, 12/2013    MPI single Isotope excrise; MPI dual Isotope Lexiscan    RBBB     Spinal stenosis     Syncope        Past Surgical History:     Past Surgical History:   Procedure Laterality Date    ABLATION OF DYSRHYTHMIC FOCUS      ARTHROSCOPY, KNEE Left 04/21/2015    Procedure: ARTHROSCOPY, KNEE PARTIAL MEDIAL AND LATERAL MENISECTOMY LEFT KNEE;  Surgeon: Bess Kinds, MD;  Location: [facility];  Service: Orthopedics;  Laterality: Left;  partial medial menisectomy    BREAST BIOPSY      HYSTERECTOMY      total    TUMOR REMOVAL Left     neck  benign        Family History:     Family History   Problem Relation Age of Onset    Diabetes Mother     Diabetes Sister     Diabetes Sister     Malignant hyperthermia Neg Hx     Pseudochol deficiency Neg Hx     Anesthesia problems Neg Hx         Social History:     Social History     Socioeconomic History    Marital status: Divorced   Tobacco Use    Smoking status: Never    Smokeless tobacco: Never   Vaping Use    Vaping status: Never Used   Substance and Sexual Activity    Alcohol use: No    Drug use: Never    Sexual activity: Not Currently     Social Determinants of Health     Financial Resource Strain: High Risk (10/16/2022)    Overall Financial Resource Strain (CARDIA)     Difficulty of Paying Living Expenses:  Hard   Food Insecurity: No Food Insecurity (12/07/2022)    Hunger Vital Sign     Worried About Running Out of Food in the Last Year: Never true     Ran Out of Food in the Last Year: Never true   Recent Concern: Food Insecurity - Food Insecurity Present (10/16/2022)    Hunger Vital Sign     Worried About Running Out of Food in the Last Year: Often true     Ran Out of Food in the Last Year: Often true   Transportation Needs: No Transportation Needs (12/07/2022)    PRAPARE - Therapist, art (Medical): No     Lack of Transportation (Non-Medical): No   Physical Activity: Insufficiently Active (10/16/2022)    Exercise Vital Sign     Days of Exercise per Week: 3 days     Minutes of Exercise per Session: 30 min   Stress: No Stress Concern Present (10/16/2022)    [employer] of Occupational Health - Occupational Stress Questionnaire     Feeling of Stress : Not at all   Recent Concern: Stress - Stress Concern Present (09/06/2022)    [employer] [employer] - Occupational Stress Questionnaire     Feeling of Stress : Very much   Social Connections: Moderately Integrated (10/16/2022)    Social Connection and Isolation Panel [NHANES]     Frequency of Communication with Friends and Family: Three times [name] week     Frequency of Social Gatherings with Friends and Family: Once [name] week     Attends Religious Services: More than 4 times per year     Active Member of [employer] or Organizations: Yes     Attends Banker  Meetings: Never     Marital Status: Divorced   Recent Concern: Social Connections - Moderately Isolated (09/06/2022)    Social Connection and Isolation Panel [NHANES]     Frequency of Communication with Friends and Family: Three times [name] week     Frequency of Social Gatherings with Friends and Family: Twice [name] week     Attends Religious Services: More than 4 times per year     Active Member of [employer] or Organizations: No     Attends Banker Meetings: Patient declined     Marital Status: Separated   Intimate Partner Violence: Not At Risk (12/07/2022)    Humiliation, Afraid, Rape, and Kick questionnaire     Fear of Current or Ex-Partner: No     Emotionally Abused: No     Physically Abused: No     Sexually Abused: No   Housing Stability: Low Risk  (12/07/2022)    Housing Stability Vital Sign     Unable to Pay for Housing in the Last Year: No     Number of Places Lived in the Last Year: 1     Unstable Housing in the Last Year: No        Allergies:     Allergies   Allergen Reactions    Aspirin      Break out in sweat and dizziness     Peppers     Pollen Extract     Latex Itching and Rash       Medications:     Current Facility-Administered Medications:     lactated ringers infusion, , Intravenous, Continuous PRN, Joaquim Lai, MD, Last Rate: 20 mL/hr at 12/07/22 0836, New Bag at 12/07/22 0836    Review of  Systems:   Constitutional:No fever,chills,weight loss or gain.  Integument:No skin rashes or lesions.  Eyes:No changes in vision  ENMT:No changes in hearing,nasal discharge,sore throat,oral lesions.  Respiratory:No shortness of breath or cough.  Cardiovascular:No chest pain or palpitations.  Genitourinary:No nocturia,urinary frequency, or dysuria.  Gastrointestinal:See HPI.  Musculoskeletal:No back or joint pain.  Neurologic:No migraine headaches,numbness, or tingling.  Endocrine:No polydipsia,cold or heat intolerance.  Hematologic:No bleeding or bruising.  Psychiatric:No depression or anxiety.     Physical Exam:      Vitals:    12/07/22 0822   BP: 147/76   Pulse: (!) 115   Resp: 18   Temp: 98.4 F (36.9 C)   TempSrc: Oral   SpO2: 98%   Weight: 69.4 kg (153 lb)   Height: 1.575 m (5\' 2" )        General appearance - Well developed and well nourished. Normal gait.  HEENT- Normocephalic. Eomi. Sclera anicteric. Normal hearing. Nares normal.  Oropharynx normal.  Neck - Supple.  No thyromegaly.  No cervical lymphadenopathy.  Chest - clear to percussion and auscultation.  No wheeze, rhonchi, or rales.  Heart - regular rate and rhythm without murmurs, gallops, or rubs.  Abdomen - normal bowel sounds.  No focal tenderness to palpation. No hepatosplenomegaly.  No masses. No ascites.  Rectal - deferred until time of colonoscopy.  Musculoskeletal - normal range of motion of arms and legs.  Extremities - no clubbing, cyanosis, or edema.  Skin - no rashes or lesions.  Neurologic - Alert and oriented to person, place and time.  No focal motor or sensory deficits.  No asterixis.  Recent and remote memory normal.

## 2022-12-08 ENCOUNTER — Encounter (INDEPENDENT_AMBULATORY_CARE_PROVIDER_SITE_OTHER): Payer: Self-pay

## 2022-12-10 ENCOUNTER — Encounter: Payer: Self-pay | Admitting: Gastroenterology

## 2022-12-10 DIAGNOSIS — D485 Neoplasm of uncertain behavior of skin: Secondary | ICD-10-CM | POA: Diagnosis not present

## 2022-12-10 DIAGNOSIS — L988 Other specified disorders of the skin and subcutaneous tissue: Secondary | ICD-10-CM | POA: Diagnosis not present

## 2022-12-11 ENCOUNTER — Ambulatory Visit (INDEPENDENT_AMBULATORY_CARE_PROVIDER_SITE_OTHER): Payer: Medicare Other | Admitting: Nurse Practitioner

## 2022-12-11 ENCOUNTER — Encounter (INDEPENDENT_AMBULATORY_CARE_PROVIDER_SITE_OTHER): Payer: Self-pay | Admitting: Nurse Practitioner

## 2022-12-11 VITALS — BP 126/70 | HR 74 | Temp 98.1°F | Wt 156.0 lb

## 2022-12-11 DIAGNOSIS — E119 Type 2 diabetes mellitus without complications: Secondary | ICD-10-CM

## 2022-12-11 NOTE — Patient Instructions (Addendum)
Continue Metformin 1000 mg twice daily  Continue Jardiance 25 mg daily  Continue Glipizide 20 mg twice daily  Continue Tradjenta 5 mg daily  Follow controlled carbohydrate diet  Follow up in 3 months  Check blood sugar once daily at different times of the day one time before breakfast, one time before lunch or dinner, 2 hours after meal and at bedtime and bring blood sugar log to the next appointment     I provided the patient information on carbohydrate content of foods, instructed to maintain a low carbohydrate diet of 30-45g/meal, + 30g for snacks /day, and to maximize foods high in protein and vegetables.  I discussed with the patient blood glucose targets, HgbA1c goals, and the importance of timing of SMBGs at home.  I discussed medication treatment options, insulin injection technique, and insulin action.  Finally, I reviewed with the patient WEIGHT LOSS AND DIETARY RECOMMENDATIONS    A 500 kcal/day reduction in caloric intake is needed in order to reduce body weight by 1 lb/wk    Restrict saturated fat intake to 7% of total calories    Restrict trans fat to ? 1% of total calories, cholesterol to 200 mg/day    Increase intake of food containing stanols/sterols (up to 2 g/d).     Add viscous dietary fiber (10-14 g /1,000 kcal)     Decrease body weight by 5-10% to reduce cardiovascular and metabolic risk.    Long-term goal of reducing and maintaining waist circumference within normal age- and sex-specific range.       Perform at least 150 min per week of moderate-intensity (50-70% age-adjusted mean heart rate) aerobic physical activity    Perform resistance training three times per week    Physical activity programs should begin slowly and build up gradually HOW TO TREAT HYPOGLYCEMIA (LOW BLOOD SUGAR) LESS THAN 70 MG/DL:  Follow rule of 15 for treating low blood sugar less than 70 mg/dl.     1.) Take a 15 gram rapidly acting carbohydrates in the form of half cup fruit juice or 3-4 glucose tablets or 1 tube of  glucose gel, available over the counter at the pharmacy  2.) Check blood sugar in 15 minutes  3.) If blood sugar is less than 70 mg/dl, repeat the rule of 15 as in step 1.  4.) Once blood sugar is greater than 70 mg/dl, eat a balanced meal or a snack with starch, protein and fat. For example, 4-5 peanut butter crackers or 4-5 cheese crackers or fiber bar.     NOTIFY us - if you continue to have blood sugars less than 70 mg/dl.   hypoglycemia (BG < 70), hypoglycemic symptoms and management of hypoglycemia (rule of 15s).

## 2022-12-11 NOTE — Progress Notes (Signed)
Chief Complaint:  Follow up DM II    HPI:  Helen [name] is a 77 y.o. female with PMH Asthma, HTN, diabetes  who presents for follow up management of  type 2 diabetes.  Diabetes type 2 was diagnosed in 1991 on routine testing, and has been managed by PCP.      In terms of severity with regards to complications, the patient does not have retinopathy, does not have nephropathy, does not have neuropathy, and does not have cardiovascular disease.                 With regards to the quality of blood sugar control, the most recent hemoglobin A1c test was 7.6 on 11/14/22.  The patient checks blood sugars one time per day  Blood sugar numbers have improved.  Current anti-hyperglycemic regimen is comprised of Tradjenta 5 mg daily, metformin 1000 mg bid, Glipizide 20 mg bid, Jardiance 25 mg daily .                 In terms of associated symptoms at this time, the patient does not have polyuria, does not have blurred vision, does not have paresthesias,  With regards to gastroparesis the patient does not have nausea and does not have diarrhea     Per the patient's history, last fundoscopic exam was in 2023 ( is going to schedule visit) and usually gets notice from Ophthalmology office for her next appointment. 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      .   Currently the patient's diet comprises of 2 meals per day.  Prior formal diabetes education/ nutritional counseling has not been performed.       Patient has not experienced any hypoglycemia symptoms.    Diabetes Medications:  Metformin 1000 mg twice daily  Glipizide 20 mg twice daily  Jardiance 25 mg daily  Tradjenta 5 mg daily    Diabetes Complications:  Podiatry: no neuropathy  Renal: None   Lab Results   Component Value Date    CREAT 0.87 11/14/2022    CREAT 0.86 05/24/2022     Ophthalmology: no retinopathy, no glaucoma, + cataracts, last visit in 2023.   Cardiovascular: none    Lipids: TC 138 trig 58, LDL 62, HDL 64    Latest Reference Range & Units 11/14/22 00:00   Cholesterol 100 - 199 mg/dL 161   HDL >09 mg/dL 67   LDL Calculated 0 - 99 mg/dL 51   Triglycerides 0 - 149 mg/dL 47   Cholesterol / HDL Ratio 0.0 - 4.4 ratio 1.9   VLDL Calculated 5 - 40 mg/dL 11     Blood pressure: 126/70    Self-monitoring blood glucose:  Glucometer at home blood sugar staying at 100 before and after breakfast     24 Hour diet recall:  1) Breakfast: Scrambled eggs, [place] bacon and piece of wheat toast   2) Lunch: skip lunch, in the afternoon yogurt and piece of fruit like strawberries and grapes   3) Dinner: Fish and sweet potatoes, vegetables green beans and broccoli   Snacks: Granola bar with no sugar and cholesterol reading labels   Beverages: Water, lemonade with no sugar, tea, decaf coffee with 1% milk     Exercise:   Regular exercise walking three times/ week 1 hour each time      Labs:  Lab Results   Component Value Date    HGBA1C 7.6 (H) 11/14/2022  HGBA1C 7.8 (H) 05/24/2022    HGBA1C 8.4 (H) 02/19/2022          Problem List:  Patient Active Problem List   Diagnosis    Hypertension    Hyperlipidemia    H/O total hysterectomy with removal of both tubes and ovaries    Heart block AV first degree    RBBB (right bundle branch block)    Palpitations    Pure hypercholesterolemia, unspecified    Nonspecific abnormal electrocardiogram (ECG) (EKG)    Type 2 diabetes mellitus without complication, without long-term current use of insulin    Type 2 diabetes mellitus with hyperglycemia    Anemia    History of colonic polyps    Gastroesophageal reflux disease, unspecified whether esophagitis present       Current Medications:  Current Outpatient Medications on File Prior to Visit   Medication Sig Dispense Refill    albuterol (PROVENTIL) (2.5 MG/3ML) 0.083% nebulizer solution TAKE 3 MLS (2.5 MG TOTAL) BY NEBULIZATION EVERY 4 (FOUR) HOURS AS NEEDED FOR SHORTNESS OF BREATH OR WHEEZING 75 mL 1    albuterol sulfate HFA (PROVENTIL) 108 (90 Base) MCG/ACT  inhaler INHALE 2 PUFFS INTO THE LUNGS EVERY 4 HOURS AS NEEDED FOR WHEEZING OR SHORTNESS OF BREATH. 6.7 each 2    amLODIPine (NORVASC) 10 MG tablet TAKE 1 TABLET (10 MG) BY MOUTH DAILY. 90 tablet 3    atorvastatin (LIPITOR) 40 MG tablet TAKE 1 TABLET BY MOUTH EVERY DAY 90 tablet 1    benzonatate (TESSALON) 100 MG capsule Take 1 capsule (100 mg) by mouth 3 (three) times daily as needed for Cough 30 capsule 0    Blood Glucose Monitoring Suppl (ONE TOUCH ULTRA 2) W/DEVICE KIT One daily 1 each 0    Cholecalciferol (Vitamin D3) 2000 UNIT capsule Take by mouth daily      dexAMETHasone (DECADRON) 0.1 % ophthalmic solution       diclofenac Sodium (VOLTAREN) 1 % Gel topical gel Apply 2 g topically 4 (four) times daily 50 g 0    empagliflozin (Jardiance) 25 MG tablet Take 1 tablet (25 mg) by mouth every morning 90 tablet 1    ferrous sulfate 325 (65 FE) MG tablet Take 1 tablet (325 mg) by mouth every morning with breakfast      fluticasone (FLONASE) 50 MCG/ACT nasal spray 2 sprays by Nasal route daily      fluticasone (FLOVENT HFA) 220 MCG/ACT inhaler INHALE 1 PUFF INTO THE LUNGS TWICE A DAY 36 each 1    glipiZIDE (GLUCOTROL) 10 MG tablet Take 2 tablets (20 mg) by mouth 2 (two) times daily before meals 360 tablet 1    glucose blood (OneTouch Ultra) test strip 2 times daily 200 each 3    Lancets (onetouch ultrasoft) lancets Use to check blood sugars 2 times daily 200 each 3    lidocaine (Lidoderm) 5 % Place 1 patch onto the skin daily as needed (pain) Remove & Discard patch within 12 hours 30 patch 0    linaGLIPtin (Tradjenta) 5 MG Tab Take 1 tablet (5 mg) by mouth daily 90 tablet 1    metFORMIN (GLUCOPHAGE) 1000 MG tablet Take 1 tablet (1,000 mg) by mouth 2 (two) times daily 180 tablet 1    montelukast (SINGULAIR) 10 MG tablet Take 1 tablet (10 mg) by mouth daily      NASONEX 50 MCG/ACT nasal spray 2 sprays by Nasal route daily as needed  5    pantoprazole (PROTONIX) 40 MG tablet TAKE  1 TABLET BY MOUTH EVERY DAY 90 tablet 1     valsartan-hydroCHLOROthiazide (DIOVAN-HCT) 160-12.5 MG per tablet Take 1 tablet by mouth daily 90 tablet 3    desonide (DESOWEN) 0.05 % cream  (Patient not taking: Reported on 12/11/2022)      fluocinonide (LIDEX) 0.05 % gel  (Patient not taking: Reported on 12/11/2022)      polyethylene glycol w/ electrolytes (MoviPrep) oral solution Take one packet following the instructions sent from your GI doctor's office. (Patient not taking: Reported on 12/11/2022) 1 each 0     No current facility-administered medications on file prior to visit.       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    Bilateral cataracts     Breast cancer     Breast lump     Cancer     breast cancer    Carcinoma     Carcinoma of the bladder    Chicken pox     Childhood illness    Conduction Disorder     RBBB    Diabetes mellitus type II     Non-Insulin dependent    Echocardiogram 10/2013    Gastroesophageal reflux disease     Hemorrhoids without complication     Holter monitor 02/2014    Hyperlipidemia     Hypertensive disorder     Malignant neoplasm of breast     Measles     Childhood illness    Mumps     Childhood illness    Myocardial perfusion scan 11/2013, 12/2013    MPI single Isotope excrise; MPI dual Isotope Lexiscan    RBBB     Spinal stenosis     Syncope        Past Surgical History:  Past Surgical History:   Procedure Laterality Date    ABLATION OF DYSRHYTHMIC FOCUS      ARTHROSCOPY, KNEE Left 04/21/2015    Procedure: ARTHROSCOPY, KNEE PARTIAL MEDIAL AND LATERAL MENISECTOMY LEFT KNEE;  Surgeon: Bess Kinds, MD;  Location: [facility];  Service:  Orthopedics;  Laterality: Left;  partial medial menisectomy    BREAST BIOPSY      COLONOSCOPY, SCREENING N/A 12/07/2022    Procedure: COLONOSCOPY, SCREENING;  Surgeon: Leilani Merl, MD;  Location: [facility];  Service: Gastroenterology;  Laterality: N/A;    EGD N/A 12/07/2022    Procedure: EGD;  Surgeon: Leilani Merl, MD;  Location: [facility];  Service: Gastroenterology;  Laterality: N/A;    HYSTERECTOMY      total    TUMOR REMOVAL Left     neck  benign        Family History:  Family History   Problem Relation Age of Onset    Diabetes Mother     Diabetes Sister     Diabetes Sister  Malignant hyperthermia Neg Hx     Pseudochol deficiency Neg Hx     Anesthesia problems Neg Hx        Social History:  Social History     Socioeconomic History    Marital status: Divorced   Tobacco Use    Smoking status: Never    Smokeless tobacco: Never   Vaping Use    Vaping status: Never Used   Substance and Sexual Activity    Alcohol use: No    Drug use: Never    Sexual activity: Not Currently     Social Determinants of Health     Financial Resource Strain: High Risk (10/16/2022)    Overall Financial Resource Strain (CARDIA)     Difficulty of Paying Living Expenses: Hard   Food Insecurity: No Food Insecurity (12/07/2022)    Hunger Vital Sign     Worried About Running Out of Food in the Last Year: Never true     Ran Out of Food in the Last Year: Never true   Recent Concern: Food Insecurity - Food Insecurity Present (10/16/2022)    Hunger Vital Sign     Worried About Running Out of Food in the Last Year: Often true     Ran Out of Food in the Last Year: Often true   Transportation Needs: No Transportation Needs (12/07/2022)    PRAPARE - Therapist, art (Medical): No     Lack of Transportation (Non-Medical): No   Physical Activity: Insufficiently Active (10/16/2022)    Exercise Vital Sign     Days of Exercise per Week: 3 days     Minutes of Exercise per Session: 30 min   Stress: No Stress Concern Present  (10/16/2022)    [employer] of Occupational Health - Occupational Stress Questionnaire     Feeling of Stress : Not at all   Recent Concern: Stress - Stress Concern Present (09/06/2022)    [employer] [employer] of Occupational Health - Occupational Stress Questionnaire     Feeling of Stress : Very much   Social Connections: Moderately Integrated (10/16/2022)    Social Connection and Isolation Panel [NHANES]     Frequency of Communication with Friends and Family: Three times a week     Frequency of Social Gatherings with Friends and Family: Once a week     Attends Religious Services: More than 4 times per year     Active Member of [employer] or Organizations: Yes     Attends Banker Meetings: Never     Marital Status: Divorced   Recent Concern: Social Connections - Moderately Isolated (09/06/2022)    Social Connection and Isolation Panel [NHANES]     Frequency of Communication with Friends and Family: Three times a week     Frequency of Social Gatherings with Friends and Family: Twice a week     Attends Religious Services: More than 4 times per year     Active Member of [employer] or Organizations: No     Attends Banker Meetings: Patient declined     Marital Status: Separated   Intimate Partner Violence: Not At Risk (12/07/2022)    Humiliation, Afraid, Rape, and Kick questionnaire     Fear of Current or Ex-Partner: No     Emotionally Abused: No     Physically Abused: No     Sexually Abused: No   Housing Stability: Low Risk  (12/07/2022)    Housing Stability Vital Sign  Unable to Pay for Housing in the Last Year: No     Number of Places Lived in the Last Year: 1     Unstable Housing in the Last Year: No       ROS:   General: Denies weight gain or weight loss, Denies fatigue  Ophthalmologic: Denies blurry vision, double vision, eye pain  ENT: Denies difficulty swallowing, voice changes  Endocrine: Denies polyuria, increased thirst  Respiratory: Denies cough, shortness of breath   Cardiovascular: Denies  palpitations, chest pain, chest pain with exertion, leg claudication, leg edema  Gastrointestinal: Denies abdominal pain, constipation, diarrhea, nausea, vomiting  Genitourinary: Denies frequent urination, painful urination, denies yeast infection  Musculoskeletal: Denies back pain, joint pain  Skin: Denies rash  Neurologic: Denies tingling, numbness of lower extremities and upper extremities  Psychiatric: Denies anxiety, depressed mood, hypersomnolence, insomnia      Visit Vitals  BP 126/70 (BP Site: Right arm, Patient Position: Sitting, Cuff Size: Medium)   Pulse 74   Temp 98.1 F (36.7 C) (Oral)   Wt 70.8 kg (156 lb)   BMI 28.53 kg/m        BP Readings from Last 3 Encounters:   12/11/22 126/70   12/07/22 127/57   11/07/22 115/70        Wt Readings from Last 4 Encounters:   12/11/22 1039 70.8 kg (156 lb)   12/07/22 0822 69.4 kg (153 lb)   11/07/22 1257 71.2 kg (157 lb)   10/16/22 1057 69.6 kg (153 lb 6.4 oz)       Physical Exam:  GENERAL APPEARANCE: alert, in no acute distress, well developed, well nourished  NECK/THYROID: neck supple  PSYCHIATRIC: normal mood, appropriate affect  Pulmonary: Breathing normally with no wheezes  Neuro: A/O X 3        Assessment/Plan:  Helen [name] is a 77 y.o. female with    No diagnosis found.      1. Controlled type 2 diabetes mellitus without complication, without long-term current use of insulin  Diabetes type 2:    Based on unknown blood sugar pattern of  fasting sugars and prandial/post-prandial sugars and controlled Hgb A1C level, will continue medications as follows: continue metformin 1000 mg bid with meals, Continue Glipizide 20 mg bid, Continue Tradjenta 5 mg daily and Jardiance 25 mg daily.  Check sugars AM fasting and 2 hours after biggest meal of day with goal fasting/pre-meal range of 80-130; goal postprandial range of 80-160.  Is due for eye exam in coming months; recommended to make an appointment with Ophthalmologist,  Continue to see podiatry for foot care.   Continue carbohydrate-controlled diet and exercise daily.      2. Hypertension (stable): goal blood pressure is <130/80.  Does not check BP at home  Continue Valsartan- HCT 160-12.5 mg daily  Continue Amlodipine 10 mg daily     3. Hyperlipidemia (stable): controlled on Lipitor 40 mg daily; goal LDL<100.  Continue low cholesterol, low saturated fat diet.  Continue Lipitor 40 mg daily     4. Overweight status (stable): counseled patient on at least 150 min/wk of moderate-intensity exercise and consider flexibility/strength training exercises if possible.  Physical activity programs should begin slowly and build up gradually; nutritional counseling status.     5. Hypoglycemia management (stable): counseled patient on need to keep sugar source with her at all times and medic-alert bracelet/indicator specifying diagnosis of diabetes  Educated patient about hypoglycemia symptoms and management of hypoglycemia  Follow up in 3 months.     Thea Gist, NP

## 2022-12-13 ENCOUNTER — Ambulatory Visit (INDEPENDENT_AMBULATORY_CARE_PROVIDER_SITE_OTHER): Payer: Medicare Other

## 2022-12-13 DIAGNOSIS — I10 Essential (primary) hypertension: Secondary | ICD-10-CM

## 2022-12-13 DIAGNOSIS — E119 Type 2 diabetes mellitus without complications: Secondary | ICD-10-CM

## 2022-12-14 LAB — LAB USE ONLY - HISTORICAL SURGICAL PATHOLOGY

## 2022-12-17 ENCOUNTER — Encounter: Payer: Self-pay | Admitting: Family Medicine

## 2022-12-17 ENCOUNTER — Ambulatory Visit (INDEPENDENT_AMBULATORY_CARE_PROVIDER_SITE_OTHER): Payer: Medicare Other | Admitting: Family Medicine

## 2022-12-17 VITALS — BP 130/82 | HR 75 | Ht 66.0 in | Wt 167.2 lb

## 2022-12-17 DIAGNOSIS — R55 Syncope and collapse: Secondary | ICD-10-CM

## 2022-12-17 DIAGNOSIS — N1831 Chronic kidney disease, stage 3a: Secondary | ICD-10-CM | POA: Diagnosis not present

## 2022-12-17 DIAGNOSIS — E038 Other specified hypothyroidism: Secondary | ICD-10-CM

## 2022-12-17 DIAGNOSIS — I10 Essential (primary) hypertension: Secondary | ICD-10-CM | POA: Diagnosis not present

## 2022-12-17 MED ORDER — TRIAMCINOLONE ACETONIDE 0.1 % EX CREA: 1.0000 | TOPICAL_CREAM | Freq: Two times a day (BID) | CUTANEOUS | 0 refills | Status: DC

## 2022-12-17 NOTE — Progress Notes (Signed)
Subjective:    Patient ID: April Beck, female    DOB: 1946/02/28, 77 y.o.   MRN: [id number]  Patient has a history of stage IIIa chronic kidney disease.  She is here today to monitor her creatinine.  She also has a history of hypertension, hyperlipidemia, hypothyroidism.  She is due for fasting lab work to monitor these conditions.  Her blood pressure today is well-controlled.  However the patient states that recently, when she is working or performing physical labor, she will have near syncopal events.  She gave the example recently of planning some flowers in her yard.  Suddenly her heart started to race.  She felt like she needed to lay down because she was going to pass out.  She also developed an aching pain in her left breast and under her left rib.  She laid down for a few seconds and was able to go without her home.  Thyroplasty manage spontaneously resolved.  She states that this has happened occasionally with physical activity.  It has never occurred spontaneously without warning.  Seems to be related to physical exertion.  However in each event, she feels like she is going to pass out and she feels like her heart is racing.  Past Medical History:  Diagnosis Date   Anxiety    hx of panic attack   Arthritis    hands & knees   CKD (chronic kidney disease), stage III    followed by pcp   Closed fracture of left distal radius 10/31/2021   Edema of both lower extremities    GERD (gastroesophageal reflux disease)    History of 2019 novel coronavirus disease (COVID-19) 09/17/2020   positive result in epic,  per pt very mild symtpoms that resolved   History of uterine fibroid    Hypertension    followed by pcp   Hypomagnesemia    Hypothyroidism    followed by pcp   IDA (iron deficiency anemia)    Osteopenia    Ovarian cyst    Pelvic pain    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, takes meloxicam   Wears contact lenses    Past Surgical History:   Procedure Laterality Date   ABDOMINAL HYSTERECTOMY  1982   CATARACT EXTRACTION Bilateral    CHOLECYSTECTOMY  06/20/2012   Procedure: LAPAROSCOPIC CHOLECYSTECTOMY;  Surgeon: Liz Malady, MD;  Location: [facility];  Service: General;  Laterality: N/A;   COLONOSCOPY  last one 03-20-2019  dr Christella Hartigan   DIAGNOSTIC LAPAROSCOPY  yrs ago   HIP ARTHROPLASTY Right 11/13/2020   Procedure: ARTHROPLASTY BIPOLAR HIP (HEMIARTHROPLASTY);  Surgeon: Vickki Hearing, MD;  Location: [facility];  Service: Orthopedics;  Laterality: Right;   INCONTINENCE SURGERY  09-11-2005   dr Annabell Howells  @[email or web address]    LYNX SLING   LAPAROSCOPIC SALPINGO OOPHERECTOMY Bilateral 01/16/2021   Procedure: DIAGNOSTIC LAPAROSCOPY;  Surgeon: Richarda Overlie, MD;  Location: [facility];  Service: Gynecology;  Laterality: Bilateral;   LAPAROSCOPY Bilateral 01/16/2021   Procedure: EXPLORATORY LAPAROTOMY BILATERAL SALPINGO OOPHORECTOMY;  Surgeon: Richarda Overlie, MD;  [facility];  Service: Gynecology;  Laterality: Bilateral;   TUBAL LIGATION     Current Outpatient Medications on File Prior to Visit  Medication Sig Dispense Refill   acetaminophen (TYLENOL) 325 MG tablet Take 2 tablets (650 mg total) by mouth every 6 (six) hours as needed for pain.     acyclovir (ZOVIRAX) 400 MG tablet TAKE 1 TABLET EVERY DAY  90 tablet 3   atorvastatin (LIPITOR) 40 MG tablet TAKE 1 TABLET EVERY DAY 90 tablet 3   calcium-vitamin D (OSCAL WITH D) 250-125 MG-UNIT tablet Take 1 tablet by mouth daily.     citalopram (CELEXA) 10 MG tablet TAKE 1 TABLET EVERY DAY 90 tablet 3   diphenoxylate-atropine (LOMOTIL) 2.5-0.025 MG tablet Take 1 tablet by mouth 4 (four) times daily as needed for diarrhea or loose stools. 30 tablet 0   fluocinonide gel (LIDEX) 0.05 % Apply 1 application topically 3 (three) times daily as needed (rash).     levothyroxine (SYNTHROID) 50 MCG tablet TAKE 1 TABLET EVERY DAY 90 tablet 2   losartan (COZAAR) 50 MG  tablet Take 1 tablet (50 mg total) by mouth daily. 90 tablet 3   pantoprazole (PROTONIX) 40 MG tablet TAKE 1 TABLET TWICE DAILY 180 tablet 2   No current facility-administered medications on file prior to visit.   No Known Allergies Social History   Socioeconomic History   Marital status: Divorced    Spouse name: Not on file   Number of children: Not on file   Years of education: Not on file   Highest education level: 12th grade  Occupational History   Not on file  Tobacco Use   Smoking status: Never   Smokeless tobacco: Never  Vaping Use   Vaping Use: Never used  Substance and Sexual Activity   Alcohol use: No   Drug use: Never   Sexual activity: Not on file  Other Topics Concern   Not on file  Social History Narrative   Not on file   Social Determinants of Health   Financial Resource Strain: Low Risk  (12/14/2022)   Overall Financial Resource Strain (CARDIA)    Difficulty of Paying Living Expenses: Not very hard  Food Insecurity: Unknown (12/14/2022)   Hunger Vital Sign    Worried About Running Out of Food in the Last Year: Never true    Ran Out of Food in the Last Year: Patient declined  Transportation Needs: No Transportation Needs (12/14/2022)   PRAPARE - Administrator, Civil Service (Medical): No    Lack of Transportation (Non-Medical): No  Physical Activity: Unknown (12/14/2022)   Exercise Vital Sign    Days of Exercise per Week: Patient declined    Minutes of Exercise per Session: Not on file  Stress: No Stress Concern Present (12/14/2022)   [employer] of Occupational Health - Occupational Stress Questionnaire    Feeling of Stress : Not at all  Social Connections: Moderately Isolated (12/14/2022)   Social Connection and Isolation Panel [NHANES]    Frequency of Communication with Friends and Family: More than three times a week    Frequency of Social Gatherings with Friends and Family: Twice a week    Attends Religious Services: More than 4  times per year    Active Member of [employer] or Organizations: No    Attends Banker Meetings: Not on file    Marital Status: Divorced  Intimate Partner Violence: Not At Risk (02/23/2021)   Humiliation, Afraid, Rape, and Kick questionnaire    Fear of Current or Ex-Partner: No    Emotionally Abused: No    Physically Abused: No    Sexually Abused: No       Review of Systems  All other systems reviewed and are negative.      Objective:   Physical Exam Vitals reviewed.  Constitutional:      General: She is not  in acute distress.    Appearance: She is well-developed. She is not diaphoretic.  HENT:     Head: Normocephalic and atraumatic.     Right Ear: External ear normal.     Left Ear: External ear normal.     Nose: Nose normal.     Mouth/Throat:     Pharynx: No oropharyngeal exudate.  Eyes:     General: No scleral icterus.       Right eye: No discharge.        Left eye: No discharge.     Conjunctiva/sclera: Conjunctivae normal.     Pupils: Pupils are equal, round, and reactive to light.  Neck:     Thyroid: No thyromegaly.     Vascular: No JVD.     Trachea: No tracheal deviation.  Cardiovascular:     Rate and Rhythm: Normal rate and regular rhythm.     Heart sounds: Normal heart sounds. No murmur heard.    No friction rub. No gallop.  Pulmonary:     Effort: Pulmonary effort is normal. No respiratory distress.     Breath sounds: Normal breath sounds. No stridor. No wheezing or rales.  Chest:     Chest wall: No tenderness.  Abdominal:     General: Bowel sounds are normal. There is no distension.     Palpations: Abdomen is soft. There is no mass.     Tenderness: There is no abdominal tenderness. There is no guarding or rebound.  Musculoskeletal:        General: No tenderness or deformity.     Cervical back: Normal range of motion and neck supple.  Lymphadenopathy:     Cervical: No cervical adenopathy.  Skin:    General: Skin is warm.     Coloration: Skin  is not pale.     Findings: No erythema or rash.  Neurological:     Mental Status: She is alert and oriented to person, place, and time.     Cranial Nerves: No cranial nerve deficit.     Motor: No abnormal muscle tone.     Coordination: Coordination normal.     Deep Tendon Reflexes: Reflexes are normal and symmetric. Reflexes normal.  Psychiatric:        Behavior: Behavior normal.        Thought Content: Thought content normal.        Judgment: Judgment normal.           Assessment & Plan:  Stage 3a chronic kidney disease - Plan: CBC with Differential/Platelet, Lipid panel, COMPLETE METABOLIC PANEL WITH GFR, TSH  Benign essential HTN - Plan: CBC with Differential/Platelet, Lipid panel, COMPLETE METABOLIC PANEL WITH GFR, TSH  Other specified hypothyroidism - Plan: TSH  Syncope, near Patient's blood pressure today is outstanding.  I will monitor her renal function by checking a CMP.  Also monitor her cholesterol and check her thyroid.  Goal LDL cholesterol is less than 130.  Ensure adequate dosage of her levothyroxine.  I am concerned about her near syncopal event occurring with exercise/physical exertion.  This seems to have occurred on several different occasions.  This time she also had some atypical left-sided chest pain.  Therefore I believe that she would benefit from a stress test to evaluate exercise-induced arrhythmias.  Consult cardiology

## 2022-12-18 ENCOUNTER — Telehealth: Payer: Self-pay | Admitting: Physical Medicine and Rehabilitation

## 2022-12-18 LAB — CBC WITH DIFFERENTIAL/PLATELET
Absolute Monocytes: 598 cells/uL (ref 200–950)
Basophils Absolute: 27 cells/uL (ref 0–200)
Basophils Relative: 0.4 %
Eosinophils Absolute: 102 cells/uL (ref 15–500)
Eosinophils Relative: 1.5 %
HCT: 39.3 % (ref 35.0–45.0)
Hemoglobin: 13.2 g/dL (ref 11.7–15.5)
Lymphs Abs: 2285 cells/uL (ref 850–3900)
MCH: 31.7 pg (ref 27.0–33.0)
MCHC: 33.6 g/dL (ref 32.0–36.0)
MCV: 94.5 fL (ref 80.0–100.0)
MPV: 9.7 fL (ref 7.5–12.5)
Monocytes Relative: 8.8 %
Neutro Abs: 3788 cells/uL (ref 1500–7800)
Neutrophils Relative %: 55.7 %
Platelets: 213 10*3/uL (ref 140–400)
RBC: 4.16 10*6/uL (ref 3.80–5.10)
RDW: 11.3 % (ref 11.0–15.0)
Total Lymphocyte: 33.6 %
WBC: 6.8 10*3/uL (ref 3.8–10.8)

## 2022-12-18 LAB — COMPLETE METABOLIC PANEL WITH GFR
AG Ratio: 2.1 (calc) (ref 1.0–2.5)
ALT: 14 U/L (ref 6–29)
AST: 21 U/L (ref 10–35)
Albumin: 4.5 g/dL (ref 3.6–5.1)
Alkaline phosphatase (APISO): 54 U/L (ref 37–153)
BUN: 19 mg/dL (ref 7–25)
CO2: 27 mmol/L (ref 20–32)
Calcium: 9.9 mg/dL (ref 8.6–10.4)
Chloride: 103 mmol/L (ref 98–110)
Creat: 0.95 mg/dL (ref 0.60–1.00)
Globulin: 2.1 g/dL (calc) (ref 1.9–3.7)
Glucose, Bld: 88 mg/dL (ref 65–99)
Potassium: 4.4 mmol/L (ref 3.5–5.3)
Sodium: 142 mmol/L (ref 135–146)
Total Bilirubin: 0.6 mg/dL (ref 0.2–1.2)
Total Protein: 6.6 g/dL (ref 6.1–8.1)
eGFR: 62 mL/min/{1.73_m2} (ref >=60)

## 2022-12-18 LAB — LIPID PANEL
Cholesterol: 122 mg/dL (ref <200)
HDL: 48 mg/dL — ABNORMAL LOW (ref >=50)
LDL Cholesterol (Calc): 48 mg/dL (calc)
Non-HDL Cholesterol (Calc): 74 mg/dL (calc) (ref <130)
Total CHOL/HDL Ratio: 2.5 (calc) (ref <5.0)
Triglycerides: 192 mg/dL — ABNORMAL HIGH (ref <150)

## 2022-12-18 LAB — TSH: TSH: 1.29 mIU/L (ref 0.40–4.50)

## 2022-12-18 NOTE — Telephone Encounter (Signed)
Patient called advised she is feeling better and do not need the MRI. Patient said she will cancel the appointment.     [phone]

## 2022-12-26 ENCOUNTER — Other Ambulatory Visit: Payer: Medicare Other

## 2023-01-07 NOTE — Progress Notes (Signed)
Helen [name],    Upper endoscopy biopsies do not show any infection.    The polyp removed from your colon is benign.    For this reason, I recommend a repeat colonoscopy in 5 years.I hope that this information is helpful to you. Your health and the quality of care you receive are important to me. Please call the office if you have any questions or concerns.

## 2023-01-10 ENCOUNTER — Ambulatory Visit (INDEPENDENT_AMBULATORY_CARE_PROVIDER_SITE_OTHER): Payer: Medicare Other

## 2023-01-10 DIAGNOSIS — E119 Type 2 diabetes mellitus without complications: Secondary | ICD-10-CM

## 2023-01-10 DIAGNOSIS — I10 Essential (primary) hypertension: Secondary | ICD-10-CM

## 2023-01-11 ENCOUNTER — Telehealth: Payer: Self-pay | Admitting: Family

## 2023-01-11 DIAGNOSIS — Z0100 Encounter for examination of eyes and vision without abnormal findings: Secondary | ICD-10-CM

## 2023-01-11 NOTE — Telephone Encounter (Signed)
Referral Request    Reason for Referral or Diagnosis: Dilation for Eyes     Provider Referral (Y/N): Yes   Insurance Referral (Y/N):    Both - Provider and Insurance Referral (Y/N):    Requested Referral Information:    Office Name/Specialty:[facility]   Doctor Name (If requesting specific provider): Dr. Blair Promise    Location Address: [address] [place], [place] [address]    Contact Information    Telephone: [phone]    Fax Number: n/a     Appointment Scheduled (Y/N): None   If Yes, Date of Scheduled appt: 01/25/2023 @10 :45am     Patient's Last Office Visit: 10/29/2022 - Nurse Visit  Patient's Next Office Visit:

## 2023-01-14 ENCOUNTER — Ambulatory Visit: Payer: Medicare Other | Attending: Internal Medicine

## 2023-01-14 ENCOUNTER — Ambulatory Visit: Payer: Medicare Other | Attending: Internal Medicine | Admitting: Internal Medicine

## 2023-01-14 ENCOUNTER — Encounter: Payer: Self-pay | Admitting: Internal Medicine

## 2023-01-14 ENCOUNTER — Other Ambulatory Visit (INDEPENDENT_AMBULATORY_CARE_PROVIDER_SITE_OTHER): Payer: Self-pay | Admitting: Family

## 2023-01-14 ENCOUNTER — Other Ambulatory Visit (INDEPENDENT_AMBULATORY_CARE_PROVIDER_SITE_OTHER): Payer: Self-pay | Admitting: Nurse Practitioner

## 2023-01-14 VITALS — BP 132/78 | HR 66 | Ht 65.0 in | Wt 169.2 lb

## 2023-01-14 DIAGNOSIS — E7800 Pure hypercholesterolemia, unspecified: Secondary | ICD-10-CM

## 2023-01-14 DIAGNOSIS — I10 Essential (primary) hypertension: Secondary | ICD-10-CM

## 2023-01-14 DIAGNOSIS — E119 Type 2 diabetes mellitus without complications: Secondary | ICD-10-CM

## 2023-01-14 DIAGNOSIS — E1165 Type 2 diabetes mellitus with hyperglycemia: Secondary | ICD-10-CM

## 2023-01-14 DIAGNOSIS — R002 Palpitations: Secondary | ICD-10-CM

## 2023-01-14 DIAGNOSIS — G43109 Migraine with aura, not intractable, without status migrainosus: Secondary | ICD-10-CM | POA: Diagnosis not present

## 2023-01-14 DIAGNOSIS — R079 Chest pain, unspecified: Secondary | ICD-10-CM | POA: Diagnosis not present

## 2023-01-14 DIAGNOSIS — R55 Syncope and collapse: Secondary | ICD-10-CM | POA: Diagnosis not present

## 2023-01-14 DIAGNOSIS — R0609 Other forms of dyspnea: Secondary | ICD-10-CM | POA: Diagnosis not present

## 2023-01-14 DIAGNOSIS — E782 Mixed hyperlipidemia: Secondary | ICD-10-CM | POA: Diagnosis not present

## 2023-01-14 NOTE — Addendum Note (Signed)
Addended by: Bea Laura B on: 01/14/2023 02:48 PM   Modules accepted: Orders

## 2023-01-14 NOTE — Addendum Note (Signed)
Addended by: Weston Brass A on: 01/14/2023 02:50 PM   Modules accepted: Orders

## 2023-01-14 NOTE — Progress Notes (Addendum)
Cardiology Office Note:    Date:  01/14/2023   ID:  April Beck, DOB Jan 24, 1946, MRN [id number]  PCP:  Donita Brooks, MD  Cardiologist:  None  Electrophysiologist:  None   Referring MD: Donita Brooks, MD   Chief Complaint/Reason for Referral: Near syncope, chest pain  History of Present Illness:    April Beck is a 77 y.o. female with a history of CKD stage III, GERD, hypertension, hyperlipidemia, hypothyroidism who presents for evaluation of near syncopal episodes and possible cardiac chest pain.  Patient saw her primary care doctor approximately 1 month ago and noted that while she was out planting some flowers in her yard her heart started to race and she felt as if she needed to lay down because she was going to pass out, she also developed an aching pain in her left breast and under her left rib.  She laid down for a few seconds.  She notes this occurring occasionally with physical activity.  Palpitations and presyncope are the predominant associated symptoms.  Fatigue and sob with activity, patient feels near syncopal with typical exertion.  She notes that in the summertime particularly when it is warmer she will have shortness of breath with activity like taking her trash cans out to the curb or going to the mailbox, particularly while working in her garden and digging.  She will notice shortness of breath that accompanies a lightheadedness and dizziness as well as a sensation of heart racing.  Heart racing does not occur when simply resting but reliably occurs with exertion and she can provoke it with activity.  She has not had frank syncope but does feel as if she may pass out.  Primary care physician concerned about arrhythmia and referred to cardiology  She does notice some discomfort in her chest under her left rib with the most recent episode of significant dyspnea.  No known smoking history, no known lung disease.  Past Medical History:  Diagnosis Date    Anxiety    hx of panic attack   Arthritis    hands & knees   CKD (chronic kidney disease), stage III (HCC)    followed by pcp   Closed fracture of left distal radius 10/31/2021   Edema of both lower extremities    GERD (gastroesophageal reflux disease)    History of 2019 novel coronavirus disease (COVID-19) 09/17/2020   positive result in epic,  per pt very mild symtpoms that resolved   History of uterine fibroid    Hypertension    followed by pcp   Hypomagnesemia    Hypothyroidism    followed by pcp   IDA (iron deficiency anemia)    Osteopenia    Ovarian cyst    Pelvic pain    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, takes meloxicam   Wears contact lenses     Past Surgical History:  Procedure Laterality Date   ABDOMINAL HYSTERECTOMY  1982   CATARACT EXTRACTION Bilateral    CHOLECYSTECTOMY  06/20/2012   Procedure: LAPAROSCOPIC CHOLECYSTECTOMY;  Surgeon: Liz Malady, MD;  Location: [facility];  Service: General;  Laterality: N/A;   COLONOSCOPY  last one 03-20-2019  dr Christella Hartigan   DIAGNOSTIC LAPAROSCOPY  yrs ago   HIP ARTHROPLASTY Right 11/13/2020   Procedure: ARTHROPLASTY BIPOLAR HIP (HEMIARTHROPLASTY);  Surgeon: Vickki Hearing, MD;  Location: [facility];  Service: Orthopedics;  Laterality: Right;   INCONTINENCE SURGERY  09-11-2005   dr Annabell Howells  @  [facility]   LYNX SLING   LAPAROSCOPIC SALPINGO OOPHERECTOMY Bilateral 01/16/2021   Procedure: DIAGNOSTIC LAPAROSCOPY;  Surgeon: Richarda Overlie, MD;  Location: [facility];  Service: Gynecology;  Laterality: Bilateral;   LAPAROSCOPY Bilateral 01/16/2021   Procedure: EXPLORATORY LAPAROTOMY BILATERAL SALPINGO OOPHORECTOMY;  Surgeon: Richarda Overlie, MD;  Location: [facility];  Service: Gynecology;  Laterality: Bilateral;   TUBAL LIGATION      Current Medications: Current Meds  Medication Sig   acetaminophen (TYLENOL) 325 MG tablet Take 2 tablets (650 mg total) by mouth every 6 (six)  hours as needed for pain.   acyclovir (ZOVIRAX) 400 MG tablet TAKE 1 TABLET EVERY DAY   atorvastatin (LIPITOR) 40 MG tablet TAKE 1 TABLET EVERY DAY   calcium-vitamin D (OSCAL WITH D) 250-125 MG-UNIT tablet Take 1 tablet by mouth daily.   citalopram (CELEXA) 10 MG tablet TAKE 1 TABLET EVERY DAY   diphenoxylate-atropine (LOMOTIL) 2.5-0.025 MG tablet Take 1 tablet by mouth 4 (four) times daily as needed for diarrhea or loose stools.   fluocinonide gel (LIDEX) 0.05 % Apply 1 application topically 3 (three) times daily as needed (rash).   levothyroxine (SYNTHROID) 50 MCG tablet TAKE 1 TABLET EVERY DAY   losartan (COZAAR) 50 MG tablet Take 1 tablet (50 mg total) by mouth daily.   pantoprazole (PROTONIX) 40 MG tablet TAKE 1 TABLET TWICE DAILY   triamcinolone cream (KENALOG) 0.1 % Apply 1 Application topically 2 (two) times daily.     Allergies:   Patient has no known allergies.   Social History   Tobacco Use   Smoking status: Never   Smokeless tobacco: Never  Vaping Use   Vaping Use: Never used  Substance Use Topics   Alcohol use: No   Drug use: Never     Family History: The patient's family history includes Breast cancer in her maternal grandmother; Colon polyps in her brother; Diabetes in her brother; Hypertension in her brother. There is no history of Colon cancer, Esophageal cancer, Rectal cancer, or Stomach cancer.  ROS:   Please see the history of present illness.    All other systems reviewed and are negative.  EKGs/Labs/Other Studies Reviewed:    The following studies were reviewed today:  EKG: Sinus rhythm anterior infarct pattern  Imaging studies that I have independently reviewed today: N/A  Recent Labs: 12/17/2022: ALT 14; BUN 19; Creat 0.95; Hemoglobin 13.2; Platelets 213; Potassium 4.4; Sodium 142; TSH 1.29  Recent Lipid Panel    Component Value Date/Time   CHOL 122 12/17/2022 1253   TRIG 192 (H) 12/17/2022 1253   HDL 48 (L) 12/17/2022 1253   CHOLHDL 2.5  12/17/2022 1253   VLDL 26 01/21/2017 1417   LDLCALC 48 12/17/2022 1253    Physical Exam:    VS:  BP 132/78 (BP Location: Left Arm, Patient Position: Sitting, Cuff Size: Normal)   Pulse 66   Ht 5\' 5"  (1.651 m)   Wt 169 lb 3.2 oz (76.7 kg)   SpO2 94%   BMI 28.16 kg/m     Wt Readings from Last 5 Encounters:  01/14/23 169 lb 3.2 oz (76.7 kg)  12/17/22 167 lb 3.2 oz (75.8 kg)  10/28/22 167 lb (75.8 kg)  06/15/22 171 lb 12.8 oz (77.9 kg)  03/15/22 170 lb (77.1 kg)    Constitutional: No acute distress Eyes: sclera non-icteric, normal conjunctiva and lids ENMT: normal dentition, moist mucous membranes Cardiovascular: regular rhythm, normal rate, no murmur. S1 and S2 normal. No jugular venous  distention.  Respiratory: clear to auscultation bilaterally GI : normal bowel sounds, soft and nontender. No distention.   MSK: extremities warm, well perfused. No edema.  NEURO: grossly nonfocal exam, moves all extremities. PSYCH: alert and oriented x 3, normal mood and affect.   ASSESSMENT:    1. Near syncope   2. Palpitations   3. Primary hypertension   4. Mixed hyperlipidemia    PLAN:    Near syncope - Plan: EKG 12-Lead, MYOCARDIAL PERFUSION IMAGING, ECHOCARDIOGRAM COMPLETE  Palpitations - Plan: EKG 12-Lead, MYOCARDIAL PERFUSION IMAGING, ECHOCARDIOGRAM COMPLETE, LONG TERM MONITOR (3-14 DAYS)  Primary hypertension - Plan: EKG 12-Lead, ECHOCARDIOGRAM COMPLETE  Mixed hyperlipidemia - Plan: MYOCARDIAL PERFUSION IMAGING  -For shortness of breath we will evaluate with echocardiogram for structural heart disease, no significant murmur on exam, do not suspect valvular heart disease.  Need to evaluate diastolic function and pulmonary pressures. -For exertional chest discomfort and dyspnea on exertion, recommend nuclear stress test.  Patient has recently been dealing with renal dysfunction with resolution more recently and a history of CKD stage III.  We discussed merits of coronary CTA versus  nuclear stress testing, and given recent kidney issues will prefer SPECT nuclear stress test to avoid iodinated contrast. -Patient concerned about heart racing with activity, less likely to represent arrhythmia since it only occurs with activity but will evaluate exertional symptoms while wearing the monitor, particularly in light of lightheadedness dizziness and near syncope.   Total time of encounter: 45 minutes total time of encounter, including 25 minutes spent in face-to-face patient care on the date of this encounter. This time includes coordination of care and counseling regarding above mentioned problem list. Remainder of non-face-to-face time involved reviewing chart documents/testing relevant to the patient encounter and documentation in the medical record. I have independently reviewed documentation from referring provider.   Weston Brass, MD, [facility]  [facility]   Shared Decision Making/Informed Consent:   Shared Decision Making/Informed Consent The risks [chest pain, shortness of breath, cardiac arrhythmias, dizziness, blood pressure fluctuations, myocardial infarction, stroke/transient ischemic attack, nausea, vomiting, allergic reaction, radiation exposure, metallic taste sensation and life-threatening complications (estimated to be 1 in 10,000)], benefits (risk stratification, diagnosing coronary artery disease, treatment guidance) and alternatives of a nuclear stress test were discussed in detail with April Beck and she agrees to proceed.   Medication Adjustments/Labs and Tests Ordered: Current medicines are reviewed at length with the patient today.  Concerns regarding medicines are outlined above.   Orders Placed This Encounter  Procedures   MYOCARDIAL PERFUSION IMAGING   LONG TERM MONITOR (3-14 DAYS)   EKG 12-Lead   ECHOCARDIOGRAM COMPLETE    No orders of the defined types were placed in this encounter.   Patient Instructions  Medication  Instructions:  No Changes In Medications at this time.   *If you need a refill on your cardiac medications before your next appointment, please call your pharmacy*  Lab Work: None Ordered At This Time.   If you have labs (blood work) drawn today and your tests are completely normal, you will receive your results only by: MyChart Message (if you have MyChart) OR A paper copy in the mail If you have any lab test that is abnormal or we need to change your treatment, we will call you to review the results.  Testing/Procedures: Your physician has requested that you have a lexiscan myoview. For further information please visit [email or web address] Please follow instruction sheet, as given. , This will take place at [address]   Your physician has requested that you have an echocardiogram. Echocardiography is a painless test that uses sound waves to create images of your heart. It provides your doctor with information about the size and shape of your heart and how well your heart's chambers and valves are working. You may receive an ultrasound enhancing agent through an IV if needed to better visualize your heart during the echo.This procedure takes approximately one hour. There are no restrictions for this procedure. This will take place at the [address].    ZIO XT- Long Term Monitor Instructions   Your physician has requested you wear your ZIO patch monitor___14____days.   This is a single patch monitor.  [employer] supplies one patch monitor per enrollment.  Additional stickers are not available.   Please do not apply patch if you will be having a Nuclear Stress Test, Echocardiogram, Cardiac CT, MRI, or Chest Xray during the time frame you would be wearing the monitor. The patch cannot be worn during these tests.  You cannot remove and re-apply the ZIO XT patch monitor.   Your ZIO patch monitor will be sent USPS Priority mail from [facility] directly  to your home address. The monitor may also be mailed to a PO BOX if home delivery is not available.   It may take 3-5 days to receive your monitor after you have been enrolled.   Once you have received you monitor, please review enclosed instructions.  Your monitor has already been registered assigning a specific monitor serial # to you.   Applying the monitor   Shave hair from upper left chest.   Hold abrader disc by orange tab.  Rub abrader in 40 strokes over left upper chest as indicated in your monitor instructions.   Clean area with 4 enclosed alcohol pads .  Use all pads to assure are is cleaned thoroughly.  Let dry.   Apply patch as indicated in monitor instructions.  Patch will be place under collarbone on left side of chest with arrow pointing upward.   Rub patch adhesive wings for 2 minutes.Remove white label marked "1".  Remove white label marked "2".  Rub patch adhesive wings for 2 additional minutes.   While looking in a mirror, press and release button in center of patch.  A small green light will flash 3-4 times .  This will be your only indicator the monitor has been turned on.     Do not shower for the first 24 hours.  You may shower after the first 24 hours.   Press button if you feel a symptom. You will hear a small click.  Record Date, Time and Symptom in the Patient Log Book.   When you are ready to remove patch, follow instructions on last 2 pages of Patient Log Book.  Stick patch monitor onto last page of Patient Log Book.   Place Patient Log Book in [place] box.  Use locking tab on box and tape box closed securely.  The [employer] and [employer] has [employer] on it.  Please place in mailbox as soon as possible.  Your physician should have your test results approximately 7 days after the monitor has been mailed back to [facility].   Call [facility] Customer Care at [phone] if you have questions regarding your ZIO XT patch monitor.  Call them immediately if  you see an orange light blinking on your monitor.   If your  monitor falls off in less than 4 days contact our Monitor department at [phone].  If your monitor becomes loose or falls off after 4 days call [employer] at [phone] for suggestions on securing your monitor.    Follow-Up: At [facility], you and your health needs are our priority.  As part of our continuing mission to provide you with exceptional heart care, we have created designated Provider Care Teams.  These Care Teams include your primary Cardiologist (physician) and Advanced Practice Providers (APPs -  Physician Assistants and Nurse Practitioners) who all work together to provide you with the care you need, when you need it.  Your next appointment:   4-6 week(s)  Provider:   Dr. Jacques Navy OR APP

## 2023-01-14 NOTE — Patient Instructions (Addendum)
Medication Instructions:  No Changes In Medications at this time.   *If you need a refill on your cardiac medications before your next appointment, please call your pharmacy*  Lab Work: None Ordered At This Time.   If you have labs (blood work) drawn today and your tests are completely normal, you will receive your results only by: MyChart Message (if you have MyChart) OR A paper copy in the mail If you have any lab test that is abnormal or we need to change your treatment, we will call you to review the results.  Testing/Procedures: Your physician has requested that you have a lexiscan myoview. For further information please visit [email or web address] Please follow instruction sheet, as given. , This will take place at [address]   Your physician has requested that you have an echocardiogram. Echocardiography is a painless test that uses sound waves to create images of your heart. It provides your doctor with information about the size and shape of your heart and how well your heart's chambers and valves are working. You may receive an ultrasound enhancing agent through an IV if needed to better visualize your heart during the echo.This procedure takes approximately one hour. There are no restrictions for this procedure. This will take place at the [address].    ZIO XT- Long Term Monitor Instructions   Your physician has requested you wear your ZIO patch monitor___14____days.   This is a single patch monitor.  [employer] supplies one patch monitor per enrollment.  Additional stickers are not available.   Please do not apply patch if you will be having a Nuclear Stress Test, Echocardiogram, Cardiac CT, MRI, or Chest Xray during the time frame you would be wearing the monitor. The patch cannot be worn during these tests.  You cannot remove and re-apply the ZIO XT patch monitor.   Your ZIO patch monitor will be sent USPS Priority mail from [facility] directly to your home address. The monitor may also be mailed to a PO BOX if home delivery is not available.   It may take 3-5 days to receive your monitor after you have been enrolled.   Once you have received you monitor, please review enclosed instructions.  Your monitor has already been registered assigning a specific monitor serial # to you.   Applying the monitor   Shave hair from upper left chest.   Hold abrader disc by orange tab.  Rub abrader in 40 strokes over left upper chest as indicated in your monitor instructions.   Clean area with 4 enclosed alcohol pads .  Use all pads to assure are is cleaned thoroughly.  Let dry.   Apply patch as indicated in monitor instructions.  Patch will be place under collarbone on left side of chest with arrow pointing upward.   Rub patch adhesive wings for 2 minutes.Remove white label marked "1".  Remove white label marked "2".  Rub patch adhesive wings for 2 additional minutes.   While looking in a mirror, press and release button in center of patch.  A small green light will flash 3-4 times .  This will be your only indicator the monitor has been turned on.     Do not shower for the first 24 hours.  You may shower after the first 24 hours.   Press button if you feel a symptom. You will hear a small click.  Record Date, Time and Symptom in the Patient Log Book.  When you are ready to remove patch, follow instructions on last 2 pages of Patient Log Book.  Stick patch monitor onto last page of Patient Log Book.   Place Patient Log Book in [place] box.  Use locking tab on box and tape box closed securely.  The [employer] has [employer] on it.  Please place in mailbox as soon as possible.  Your physician should have your test results approximately 7 days after the monitor has been mailed back to [facility].   Call [facility] Customer Care at [phone] if you have questions regarding your ZIO XT patch monitor.   Call them immediately if you see an orange light blinking on your monitor.   If your monitor falls off in less than 4 days contact our Monitor department at [phone].  If your monitor becomes loose or falls off after 4 days call [employer] at [phone] for suggestions on securing your monitor.    Follow-Up: At [facility], you and your health needs are our priority.  As part of our continuing mission to provide you with exceptional heart care, we have created designated Provider Care Teams.  These Care Teams include your primary Cardiologist (physician) and Advanced Practice Providers (APPs -  Physician Assistants and Nurse Practitioners) who all work together to provide you with the care you need, when you need it.  Your next appointment:   4-6 week(s)  Provider:   Dr. Jacques Navy OR APP

## 2023-01-14 NOTE — Progress Notes (Unsigned)
Enrolled for [employer] to mail a ZIO XT long term holter monitor to the patients address on file.  

## 2023-01-14 NOTE — Telephone Encounter (Signed)
Name, strength, directions of requested refill(s):    1.Needs Strips & Needles   2. Cholecalciferol (Vitamin D3) 2000 UNIT capsule       How much medication is remaining: 6 left of each Needles & Strips/ 4 Vitamin D Left     Pharmacy to send refill to or patient to pick up rx from office (mark requested pharmacy in BOLD):      [id number], [place] - [address]  [address]  [place] [address]  Phone: [phone] Fax: [phone]    [facility] [place], [place] - [address] AT Portal to Registered [address]  [address] [place] [address]  Phone: [phone] Fax: [phone]    [facility] [place], [place] - [address]  [address] [place] [address]  Phone: [phone] Fax: [phone]        Please mark "X" next to the preferred call back number:    Mobile: [phone] (mobile) X   Home: [phone] (home)    Work: @WORKPHONE @        Medication refill request, see above. Thank you   Patient has been informed that medication refill requests should be called in up to one week prior to running out of medication.    Additional Notes:

## 2023-01-15 MED ORDER — ONETOUCH ULTRA VI STRP
ORAL_STRIP | 3 refills | Status: AC
Start: 2023-01-15 — End: ?

## 2023-01-15 MED ORDER — VITAMIN D3 50 MCG (2000 UT) PO CAPS
2000.0000 [IU] | ORAL_CAPSULE | Freq: Every day | ORAL | 1 refills | Status: AC
Start: 2023-01-15 — End: ?

## 2023-01-15 MED ORDER — ONETOUCH ULTRASOFT LANCETS MISC
3 refills | Status: DC
Start: 2023-01-15 — End: 2023-01-18

## 2023-01-15 NOTE — Addendum Note (Signed)
Addended by: Talmadge Chad on: 01/15/2023 02:34 PM     Modules accepted: Orders

## 2023-01-15 NOTE — Telephone Encounter (Signed)
Referral printed and left at [facility] to fax.

## 2023-01-15 NOTE — Telephone Encounter (Signed)
Referral in

## 2023-01-16 DIAGNOSIS — R002 Palpitations: Secondary | ICD-10-CM | POA: Diagnosis not present

## 2023-01-16 IMAGING — DX DG CHEST 1V
1 series · 1 of 1 positions shown · non-contrast
Comparison: None.

CLINICAL DATA: Status post fall.

EXAM:
CHEST  1 VIEW

[chest ap]
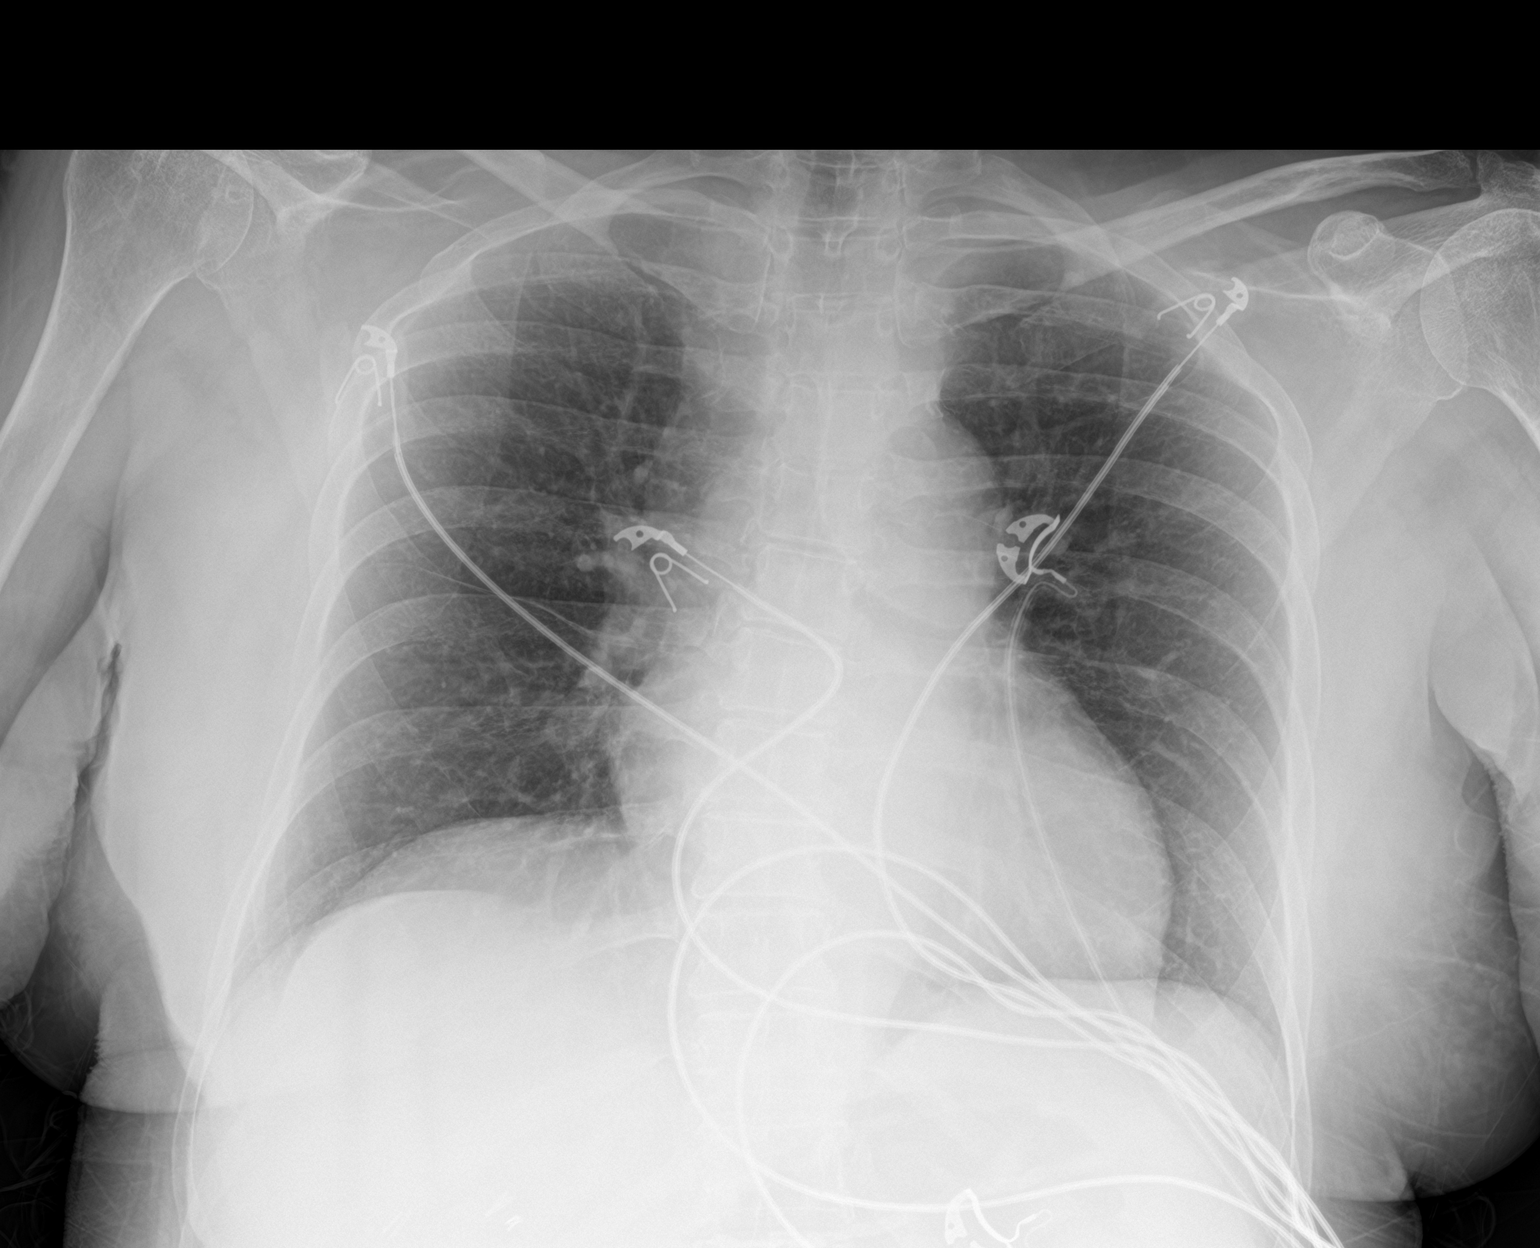

[1 of 1 positions shown; findings below may reference images not displayed]

FINDINGS: The heart size and mediastinal contours are within normal limits.
Both lungs are clear. Radiopaque surgical clips are seen overlying
the right upper quadrant. The visualized skeletal structures are
unremarkable.
IMPRESSION: No active disease.

## 2023-01-17 IMAGING — DX DG PORTABLE PELVIS
2 series · 2 of 2 positions shown · non-contrast
Comparison: 11/12/2020 right hip radiographs

CLINICAL DATA: Aspiration post right hip arthroplasty

EXAM:
PORTABLE PELVIS 1-2 VIEWS

[pelvis ap (1 of 2)]
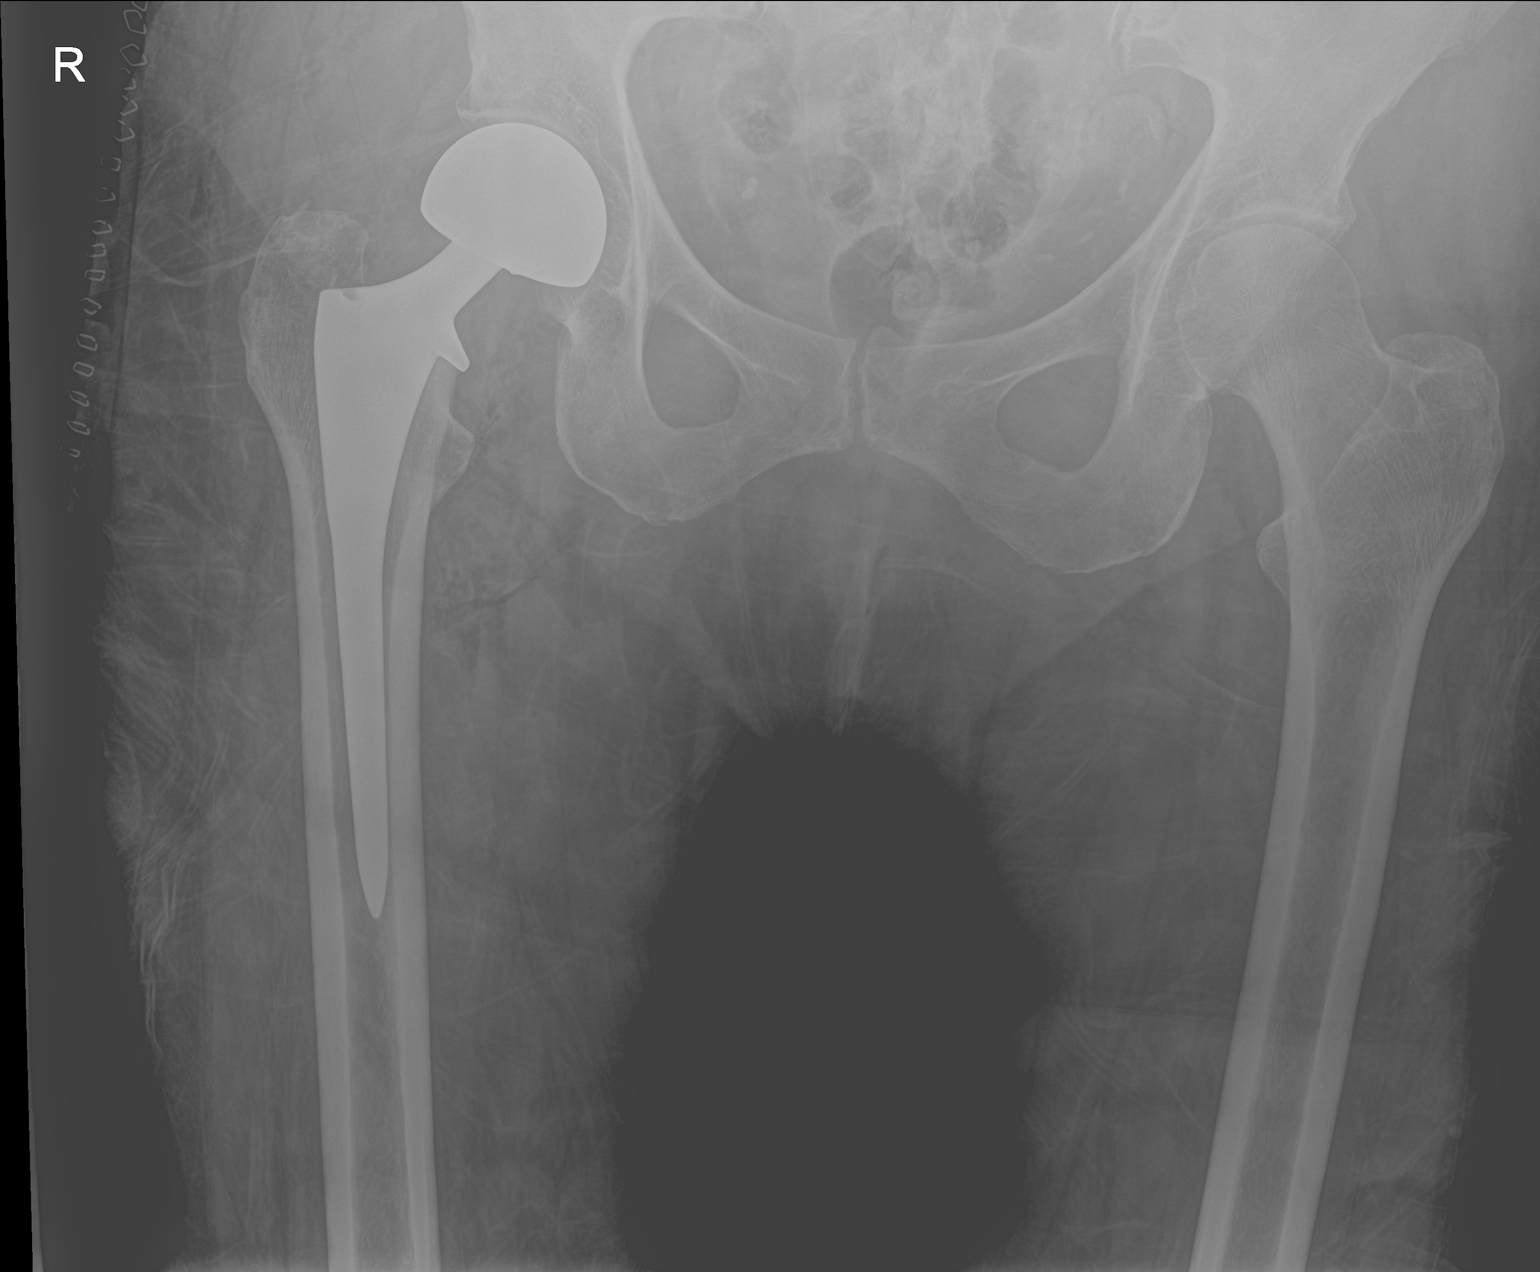

[pelvis ap (2 of 2)]
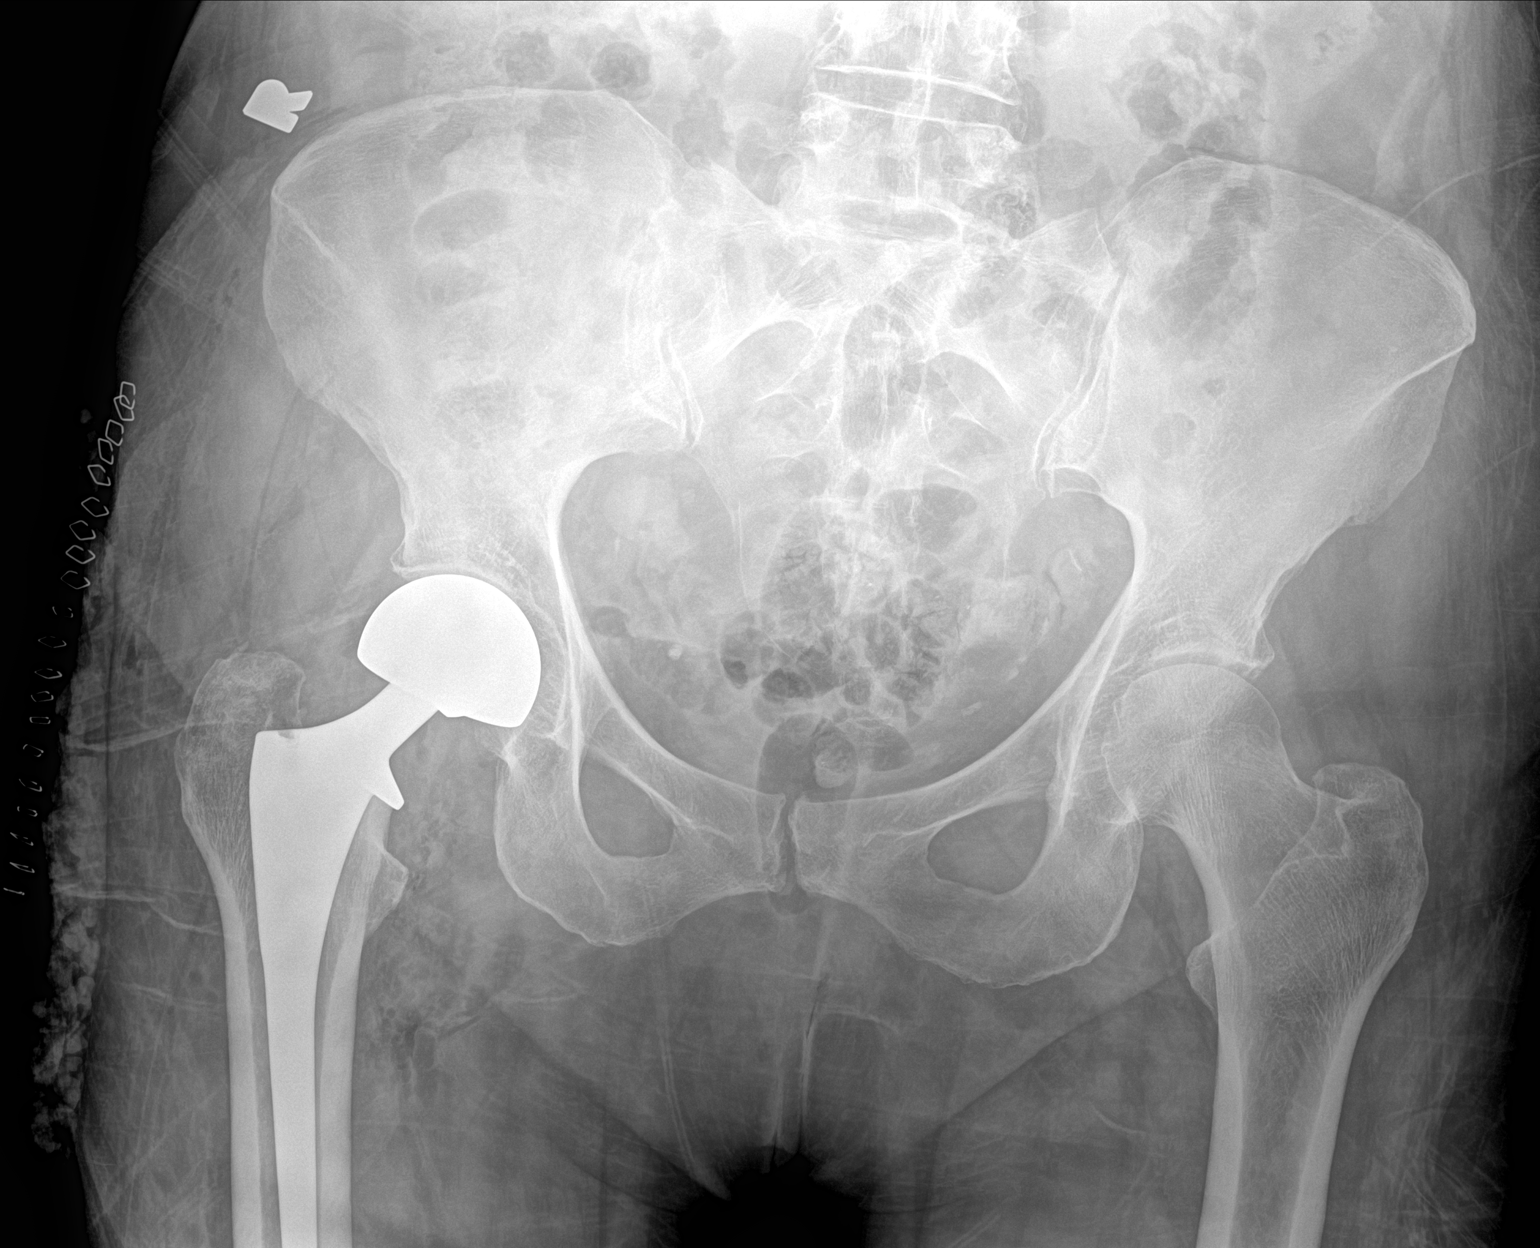

[2 of 2 positions shown; findings below may reference images not displayed]

FINDINGS: Interval right hip hemiarthroplasty with well-positioned proximal
right femoral prosthesis. No right hip dislocation on these frontal
views. No pelvic fracture or diastasis. No focal osseous lesions.
Expected soft tissue gas surrounding the right hip. Skin staples
lateral to the right hip.
IMPRESSION: Satisfactory immediate postoperative appearance status post right
hip hemiarthroplasty.

## 2023-01-18 ENCOUNTER — Other Ambulatory Visit (INDEPENDENT_AMBULATORY_CARE_PROVIDER_SITE_OTHER): Payer: Self-pay | Admitting: Family

## 2023-01-18 DIAGNOSIS — E1165 Type 2 diabetes mellitus with hyperglycemia: Secondary | ICD-10-CM

## 2023-01-18 MED ORDER — ONETOUCH ULTRASOFT LANCETS MISC
3 refills | Status: DC
Start: 2023-01-18 — End: 2023-01-22

## 2023-01-18 NOTE — Telephone Encounter (Signed)
Name, strength, directions of requested refill(s):Lancets (onetouch ultrasoft) lancets       How much medication is remaining: none    Pharmacy to send refill to or patient to pick up rx from office (mark requested pharmacy in BOLD):      [id number], [place] - [address]  [address]  [place] [address]  Phone: [phone] Fax: [phone]    [facility] - [place], [place] - [address] AT Portal to Registered [address]  [place] [address]  Phone: [phone] Fax: [phone]    [facility] - [place], [place] - [address]  [address] [place] [address]  Phone: [phone] Fax: [phone]        Please mark "X" next to the preferred call back number:    Mobile: [phone] (mobile)    Home: [phone] (home)    Work: @WORKPHONE @        Medication refill request, see above. Thank you   Patient has been informed that medication refill requests should be called in up to one week prior to running out of medication.    Additional Notes:  Last Visit: MM/DD/YYYY  Next Visit: MM/DD/YYYY

## 2023-01-22 ENCOUNTER — Encounter (INDEPENDENT_AMBULATORY_CARE_PROVIDER_SITE_OTHER): Payer: Self-pay | Admitting: Family

## 2023-01-22 ENCOUNTER — Other Ambulatory Visit (INDEPENDENT_AMBULATORY_CARE_PROVIDER_SITE_OTHER): Payer: Self-pay

## 2023-01-22 DIAGNOSIS — E1165 Type 2 diabetes mellitus with hyperglycemia: Secondary | ICD-10-CM

## 2023-01-22 MED ORDER — ONETOUCH ULTRASOFT LANCETS MISC: 3 refills | Status: AC

## 2023-01-22 MED ORDER — ONETOUCH ULTRASOFT LANCETS MISC
3 refills | Status: DC
Start: 2023-01-22 — End: 2023-01-22

## 2023-01-22 NOTE — Telephone Encounter (Signed)
Resent Order

## 2023-01-22 NOTE — Telephone Encounter (Signed)
Pt is requesting if prescription refill could be sent for  Lancets (onetouch ultrasoft) lancets   To be sent to pharmacy at  [id number], [place] - [address]  [address]  [place] [address]  Phone: [phone] Fax: [phone]    Pt does not want to pick up prescription at office, pt wants this refill sent directly to pharmacy.    Please send refill and call pt back at [phone] once refill has been sent.

## 2023-01-22 NOTE — Addendum Note (Signed)
Addended by: Anitra Lauth on: 01/22/2023 12:40 PM     Modules accepted: Orders

## 2023-01-22 NOTE — Telephone Encounter (Signed)
Patient called following up on request, fax# [phone]

## 2023-01-26 ENCOUNTER — Other Ambulatory Visit: Payer: Self-pay | Admitting: Family Medicine

## 2023-01-29 ENCOUNTER — Encounter: Payer: Self-pay | Admitting: Family Medicine

## 2023-01-29 NOTE — Telephone Encounter (Signed)
Requested Prescriptions  Refused Prescriptions Disp Refills   losartan (COZAAR) 50 MG tablet [Pharmacy Med Name: LOSARTAN POTASSIUM 50 MG Tablet] 90 tablet 3    Sig: TAKE 1 TABLET EVERY DAY     Cardiovascular:  Angiotensin Receptor Blockers Failed - 01/26/2023  2:29 AM      Failed - Valid encounter within last 6 months    Recent Outpatient Visits           1 year ago Vertigo   [facility] Pickard, Priscille Heidelberg, MD   1 year ago Dysuria   [facility] Tanya Nones, Priscille Heidelberg, MD   1 year ago Benign essential HTN   [facility] Donita Brooks, MD   2 years ago Closed fracture of right hip with routine healing, subsequent encounter   [facility] Donita Brooks, MD   2 years ago Complex cyst of left ovary   [facility] Pickard, Priscille Heidelberg, MD       Future Appointments             In 3 weeks Lyman Bishop Bettey Mare, NP  [facility]   In 1 month Pickard, Priscille Heidelberg, MD [facility], [facility]   In 2 months Tanya Nones, Priscille Heidelberg, MD [facility]            Passed - Cr in normal range and within 180 days    Creat  Date Value Ref Range Status  12/17/2022 0.95 0.60 - 1.00 mg/dL Final   Creatinine, Urine  Date Value Ref Range Status  06/15/2022 192 20 - 275 mg/dL Final         Passed - K in normal range and within 180 days    Potassium  Date Value Ref Range Status  12/17/2022 4.4 3.5 - 5.3 mmol/L Final         Passed - Patient is not pregnant      Passed - Last BP in normal range    BP Readings from Last 1 Encounters:  01/14/23 132/78

## 2023-01-30 ENCOUNTER — Other Ambulatory Visit: Payer: Self-pay

## 2023-01-30 ENCOUNTER — Telehealth: Payer: Self-pay

## 2023-01-30 DIAGNOSIS — I10 Essential (primary) hypertension: Secondary | ICD-10-CM

## 2023-01-30 DIAGNOSIS — N1831 Chronic kidney disease, stage 3a: Secondary | ICD-10-CM

## 2023-01-30 MED ORDER — LOSARTAN POTASSIUM 50 MG PO TABS: 50.0000 mg | ORAL_TABLET | Freq: Every day | ORAL | 1 refills | Status: DC

## 2023-01-30 NOTE — Telephone Encounter (Signed)
Prescription Request  01/30/2023  LOV: 12/17/22  What is the name of the medication or equipment? losartan (COZAAR) 50 MG tablet [[id number]]   Have you contacted your pharmacy to request a refill? Yes   Which pharmacy would you like this sent to? [facility] - [place], [place] - [address] [place] [address] Phone: [phone]  Fax: [phone]     Patient notified that their request is being sent to the clinical staff for review and that they should receive a response within 2 business days.   Please advise at [facility] [phone]

## 2023-02-07 DIAGNOSIS — R002 Palpitations: Secondary | ICD-10-CM | POA: Diagnosis not present

## 2023-02-13 NOTE — Telephone Encounter (Signed)
Left a detailed message on voice mail  per [facility] regarding a scheduled STRESS TEST on 02/15/23 at 10:30.

## 2023-02-14 ENCOUNTER — Ambulatory Visit (INDEPENDENT_AMBULATORY_CARE_PROVIDER_SITE_OTHER): Payer: Medicare Other

## 2023-02-14 DIAGNOSIS — E119 Type 2 diabetes mellitus without complications: Secondary | ICD-10-CM

## 2023-02-14 DIAGNOSIS — I10 Essential (primary) hypertension: Secondary | ICD-10-CM

## 2023-02-14 NOTE — Progress Notes (Signed)
Cardiology Clinic Note   Patient Name: April Beck Date of Encounter: 02/22/2023  Primary Care Provider:  Donita Brooks, MD Primary Cardiologist:  Parke Poisson, MD  Patient Profile    77 year old female with history of hypertension, hyperlipidemia, chronic kidney disease stage III, hypothyroidism, and GERD.  Recently seen by Dr.Acharya with complaints of chest pain and near syncope on 01/14/2023.  A myocardial perfusion study and echocardiogram were ordered, along with long-term monitor.  Long-term monitor revealed predominant underlying rhythm was sinus rhythm with a run of ventricular tachycardia x 1, 42 supraventricular tachycardic runs with a maximum rate of 197 bpm lasting 22.3 seconds.Normal EF without WMA on echo, normal NM stress test on 02/17/2023.   Past Medical History    Past Medical History:  Diagnosis Date   Anxiety    hx of panic attack   Arthritis    hands & knees   CKD (chronic kidney disease), stage III (HCC)    followed by pcp   Closed fracture of left distal radius 10/31/2021   Edema of both lower extremities    GERD (gastroesophageal reflux disease)    History of 2019 novel coronavirus disease (COVID-19) 09/17/2020   positive result in epic,  per pt very mild symtpoms that resolved   History of uterine fibroid    Hypertension    followed by pcp   Hypomagnesemia    Hypothyroidism    followed by pcp   IDA (iron deficiency anemia)    Osteopenia    Ovarian cyst    Pelvic pain    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, takes meloxicam   Wears contact lenses    Past Surgical History:  Procedure Laterality Date   ABDOMINAL HYSTERECTOMY  1982   CATARACT EXTRACTION Bilateral    CHOLECYSTECTOMY  06/20/2012   Procedure: LAPAROSCOPIC CHOLECYSTECTOMY;  Surgeon: Liz Malady, MD;  Location: [facility];  Service: General;  Laterality: N/A;   COLONOSCOPY  last one 03-20-2019  dr Christella Hartigan   DIAGNOSTIC LAPAROSCOPY  yrs ago   HIP  ARTHROPLASTY Right 11/13/2020   Procedure: ARTHROPLASTY BIPOLAR HIP (HEMIARTHROPLASTY);  Surgeon: Vickki Hearing, MD;  Location: [facility];  Service: Orthopedics;  Laterality: Right;   INCONTINENCE SURGERY  09-11-2005   dr Annabell Howells  @[email or web address]    LYNX SLING   LAPAROSCOPIC SALPINGO OOPHERECTOMY Bilateral 01/16/2021   Procedure: DIAGNOSTIC LAPAROSCOPY;  Surgeon: Richarda Overlie, MD;  Location: [facility];  Service: Gynecology;  Laterality: Bilateral;   LAPAROSCOPY Bilateral 01/16/2021   Procedure: EXPLORATORY LAPAROTOMY BILATERAL SALPINGO OOPHORECTOMY;  Surgeon: Richarda Overlie, MD;  Location: [facility];  Service: Gynecology;  Laterality: Bilateral;   TUBAL LIGATION      Allergies  No Known Allergies  History of Present Illness    April Beck comes today for discussion of test results which included nuclear medicine stress test, echocardiogram, and cardiac ZIO monitor.  The patient denies any chest discomfort shortness of breath or palpitations currently.  She continues to work in her garden and be active in her home.  She does have a left knee brace from chronic knee pain which is limiting her activities concerning significant exertion.  Home Medications    Current Outpatient Medications  Medication Sig Dispense Refill   acetaminophen (TYLENOL) 325 MG tablet Take 2 tablets (650 mg total) by mouth every 6 (six) hours as needed for pain.     acyclovir (ZOVIRAX) 400 MG tablet TAKE 1 TABLET EVERY DAY 90 tablet  3   atorvastatin (LIPITOR) 40 MG tablet TAKE 1 TABLET EVERY DAY 90 tablet 3   calcium-vitamin D (OSCAL WITH D) 250-125 MG-UNIT tablet Take 1 tablet by mouth daily.     citalopram (CELEXA) 10 MG tablet TAKE 1 TABLET EVERY DAY 90 tablet 3   fluocinonide gel (LIDEX) 0.05 % Apply 1 application topically 3 (three) times daily as needed (rash).     levothyroxine (SYNTHROID) 50 MCG tablet TAKE 1 TABLET EVERY DAY 90 tablet 2   losartan (COZAAR) 50 MG tablet Take 1  tablet (50 mg total) by mouth daily. 90 tablet 1   pantoprazole (PROTONIX) 40 MG tablet TAKE 1 TABLET TWICE DAILY 180 tablet 2   triamcinolone cream (KENALOG) 0.1 % Apply 1 Application topically 2 (two) times daily. 30 g 0   diphenoxylate-atropine (LOMOTIL) 2.5-0.025 MG tablet Take 1 tablet by mouth 4 (four) times daily as needed for diarrhea or loose stools. (Patient not taking: Reported on 02/22/2023) 30 tablet 0   No current facility-administered medications for this visit.     Family History    Family History  Problem Relation Age of Onset   Colon polyps Brother    Breast cancer Maternal Grandmother    Diabetes Brother    Hypertension Brother    Colon cancer Neg Hx    Esophageal cancer Neg Hx    Rectal cancer Neg Hx    Stomach cancer Neg Hx    She indicated that her mother is deceased. She indicated that her father is deceased. She indicated that both of her brothers are alive. She indicated that the status of her maternal grandmother is unknown. She indicated that the status of her neg hx is unknown.  Social History    Social History   Socioeconomic History   Marital status: Divorced    Spouse name: Not on file   Number of children: Not on file   Years of education: Not on file   Highest education level: 12th grade  Occupational History   Not on file  Tobacco Use   Smoking status: Never   Smokeless tobacco: Never  Vaping Use   Vaping Use: Never used  Substance and Sexual Activity   Alcohol use: No   Drug use: Never   Sexual activity: Not on file  Other Topics Concern   Not on file  Social History Narrative   Not on file   Social Determinants of Health   Financial Resource Strain: Low Risk  (12/14/2022)   Overall Financial Resource Strain (CARDIA)    Difficulty of Paying Living Expenses: Not very hard  Food Insecurity: Unknown (12/14/2022)   Hunger Vital Sign    Worried About Running Out of Food in the Last Year: Never true    Ran Out of Food in the Last  Year: Patient declined  Transportation Needs: No Transportation Needs (12/14/2022)   PRAPARE - Administrator, Civil Service (Medical): No    Lack of Transportation (Non-Medical): No  Physical Activity: Unknown (12/14/2022)   Exercise Vital Sign    Days of Exercise per Week: Patient declined    Minutes of Exercise per Session: Not on file  Stress: No Stress Concern Present (12/14/2022)   [employer] of Occupational Health - Occupational Stress Questionnaire    Feeling of Stress : Not at all  Social Connections: Moderately Isolated (12/14/2022)   Social Connection and Isolation Panel [NHANES]    Frequency of Communication with Friends and Family: More than three times a week  Frequency of Social Gatherings with Friends and Family: Twice a week    Attends Religious Services: More than 4 times per year    Active Member of [employer] or Organizations: No    Attends Engineer, structural: Not on file    Marital Status: Divorced  Intimate Partner Violence: Not At Risk (02/23/2021)   Humiliation, Afraid, Rape, and Kick questionnaire    Fear of Current or Ex-Partner: No    Emotionally Abused: No    Physically Abused: No    Sexually Abused: No     Review of Systems    General:  No chills, fever, night sweats or weight changes.  Cardiovascular:  No chest pain, dyspnea on exertion, edema, orthopnea, palpitations, paroxysmal nocturnal dyspnea. Dermatological: No rash, lesions/masses Respiratory: No cough, dyspnea Urologic: No hematuria, dysuria Abdominal:   No nausea, vomiting, diarrhea, bright red blood per rectum, melena, or hematemesis Neurologic:  No visual changes, wkns, changes in mental status. All other systems reviewed and are otherwise negative except as noted above.     Physical Exam    VS:  BP 122/80 (BP Location: Left Arm, Patient Position: Sitting, Cuff Size: Normal)   Pulse 71   Ht 5\' 6"  (1.676 m)   Wt 165 lb 6.4 oz (75 kg)   SpO2 95%   BMI 26.70  kg/m  , BMI Body mass index is 26.7 kg/m.     GEN: Well nourished, well developed, in no acute distress. HEENT: normal. Neck: Supple, no JVD, carotid bruits, or masses. Cardiac: RRR, no murmurs, rubs, or gallops. No clubbing, cyanosis, edema.  Radials/DP/PT 2+ and equal bilaterally.  Respiratory:  Respirations regular and unlabored, clear to auscultation bilaterally. GI: Soft, nontender, nondistended, BS + x 4. MS: no deformity or atrophy. Skin: warm and dry, no rash. Neuro:  Strength and sensation are intact. Psych: Normal affect.  Accessory Clinical Findings    ECG personally reviewed by me today-not completed this office visit.  Lab Results  Component Value Date   WBC 6.8 12/17/2022   HGB 13.2 12/17/2022   HCT 39.3 12/17/2022   MCV 94.5 12/17/2022   PLT 213 12/17/2022   Lab Results  Component Value Date   CREATININE 0.95 12/17/2022   BUN 19 12/17/2022   NA 142 12/17/2022   K 4.4 12/17/2022   CL 103 12/17/2022   CO2 27 12/17/2022   Lab Results  Component Value Date   ALT 14 12/17/2022   AST 21 12/17/2022   ALKPHOS 46 11/13/2020   BILITOT 0.6 12/17/2022   Lab Results  Component Value Date   CHOL 122 12/17/2022   HDL 48 (L) 12/17/2022   LDLCALC 48 12/17/2022   TRIG 192 (H) 12/17/2022   CHOLHDL 2.5 12/17/2022    Lab Results  Component Value Date   HGBA1C 5.1 01/21/2017    Review of Prior Studies: Long Term Heart Monitor 01/27/2023 Patch Wear Time:  13 days and 13 hours (2024-05-15T17:33:27-0400 to 2024-05-29T06:52:30-0400)   Patient had a min HR of 51 bpm, max HR of 197 bpm, and avg HR of 72 bpm. Predominant underlying rhythm was Sinus Rhythm. 1 run of Ventricular Tachycardia occurred lasting 4 beats with a max rate of 176 bpm (avg 173 bpm). 42 Supraventricular Tachycardia  runs occurred, the run with the fastest interval lasting 6 beats with a max rate of 197 bpm, the longest lasting 22.3 secs with an avg rate of 102 bpm. Isolated SVEs were rare (<1.0%),  SVE Couplets were rare (<1.0%), and SVE  Triplets were rare (<1.0%).  Isolated VEs were rare (<1.0%), VE Couplets were rare (<1.0%), and no VE Triplets were present. Ventricular Trigeminy was present.   Echocardiogram 02/15/2023 1. Left ventricular ejection fraction, by estimation, is 55 to 60%. The  left ventricle has normal function. The left ventricle has no regional  wall motion abnormalities. There is mild concentric left ventricular  hypertrophy. Left ventricular diastolic  parameters were normal. GLS -18.1%.   2. Right ventricular systolic function is normal. The right ventricular  size is normal.   3. The mitral valve is normal in structure. No evidence of mitral valve  regurgitation. No evidence of mitral stenosis.   4. The aortic valve is normal in structure. Aortic valve regurgitation is  not visualized. No aortic stenosis is present.   5. The inferior vena cava is normal in size with greater than 50%  respiratory variability, suggesting right atrial pressure of 3 mmHg.   NM Stress Test 02/15/2023  The study is normal. The study is low risk.   No ST deviation was noted.   LV perfusion is grossly normal. There is a small fixed mild inferoapical perfusion abnormality, adjacent to extracardiac tracer uptake. Wall motion normal in this region. Findings most consistent with artifact. There is no evidence of ischemia. There is no evidence of infarction.   Left ventricular function is normal. Nuclear stress EF: 74 %. The left ventricular ejection fraction is hyperdynamic (>65%). End diastolic cavity size is normal. End systolic cavity size is normal. No evidence of transient ischemic dilation (TID) noted.   Prior study not available for comparison.  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  The patient is asymptomatic with this right now.  She states that she may have noticed that her heart rate was elevated when she was outside working  in her garden but she was not having any symptoms associated with it such as dizziness shortness of breath or chest pressure.  We did discuss placing her on a low-dose beta-blocker but she would like to hold off at this time.  She admits to drinking caffeine in the morning and iced tea throughout the day.  We are going to have her stop caffeinated drinks for now and keep her hydrated.  I did review her labs and her potassium level is normal and she is not anemic.  Will see her again in 6 months at which time we may need to consider putting her on low-dose metoprolol and discontinuing ARB if she becomes symptomatic with the PSVT.  2.  Hypertension: Currently well-controlled on losartan 50 mg daily.  I have reviewed labs kidney function is normal potassium level is normal.  No changes in her regimen.  3.  Hypercholesterolemia: Followed by PCP.  Remains on atorvastatin 40 mg daily.  Goal of LDL less than 100.         Signed, Bettey Mare. Liborio Nixon, ANP, AACC   02/22/2023 1:52 PM      Office [phone] Fax [phone]  Notice: This dictation was prepared with Dragon dictation along with smaller phrase technology. Any transcriptional errors that result from this process are unintentional and may not be corrected upon review.

## 2023-02-15 ENCOUNTER — Ambulatory Visit (HOSPITAL_BASED_OUTPATIENT_CLINIC_OR_DEPARTMENT_OTHER): Payer: Medicare Other

## 2023-02-15 ENCOUNTER — Ambulatory Visit (HOSPITAL_COMMUNITY): Payer: Medicare Other | Attending: Cardiology

## 2023-02-15 DIAGNOSIS — E782 Mixed hyperlipidemia: Secondary | ICD-10-CM | POA: Diagnosis not present

## 2023-02-15 DIAGNOSIS — R002 Palpitations: Secondary | ICD-10-CM | POA: Diagnosis not present

## 2023-02-15 DIAGNOSIS — R079 Chest pain, unspecified: Secondary | ICD-10-CM

## 2023-02-15 DIAGNOSIS — R0602 Shortness of breath: Secondary | ICD-10-CM

## 2023-02-15 DIAGNOSIS — R55 Syncope and collapse: Secondary | ICD-10-CM

## 2023-02-15 DIAGNOSIS — I10 Essential (primary) hypertension: Secondary | ICD-10-CM | POA: Diagnosis not present

## 2023-02-15 DIAGNOSIS — R0609 Other forms of dyspnea: Secondary | ICD-10-CM | POA: Insufficient documentation

## 2023-02-15 LAB — MYOCARDIAL PERFUSION IMAGING
LV dias vol: 59 mL (ref 46–106)
LV sys vol: 15 mL
Nuc Stress EF: 74 %
Peak HR: 82 {beats}/min
Rest HR: 62 {beats}/min
Rest Nuclear Isotope Dose: 10.8 mCi
SDS: 0
SRS: 0
SSS: 0
ST Depression (mm): 0 mm
Stress Nuclear Isotope Dose: 31.1 mCi
TID: 0.96

## 2023-02-15 LAB — ECHOCARDIOGRAM COMPLETE
Area-P 1/2: 2.62 cm2
S' Lateral: 2.7 cm

## 2023-02-15 MED ORDER — REGADENOSON 0.4 MG/5ML IV SOLN
0.4000 mg | Freq: Once | INTRAVENOUS | Status: AC
  Administered 2023-02-15: 0.4 mg via INTRAVENOUS

## 2023-02-15 MED ORDER — TECHNETIUM TC 99M TETROFOSMIN IV KIT
10.8000 | PACK | Freq: Once | INTRAVENOUS | Status: AC | PRN
  Administered 2023-02-15: 10.8 via INTRAVENOUS

## 2023-02-15 MED ORDER — TECHNETIUM TC 99M TETROFOSMIN IV KIT
31.1000 | PACK | Freq: Once | INTRAVENOUS | Status: AC | PRN
  Administered 2023-02-15: 31.1 via INTRAVENOUS

## 2023-02-22 ENCOUNTER — Ambulatory Visit: Payer: Medicare Other | Admitting: Adult Health

## 2023-02-22 ENCOUNTER — Encounter: Payer: Self-pay | Admitting: Adult Health

## 2023-02-22 ENCOUNTER — Ambulatory Visit: Payer: Medicare Other | Attending: Adult Health | Admitting: Adult Health

## 2023-02-22 VITALS — BP 122/80 | HR 71 | Ht 66.0 in | Wt 165.4 lb

## 2023-02-22 DIAGNOSIS — I471 Supraventricular tachycardia, unspecified: Secondary | ICD-10-CM | POA: Diagnosis not present

## 2023-02-22 DIAGNOSIS — E7800 Pure hypercholesterolemia, unspecified: Secondary | ICD-10-CM | POA: Diagnosis not present

## 2023-02-22 DIAGNOSIS — I10 Essential (primary) hypertension: Secondary | ICD-10-CM

## 2023-02-22 NOTE — Patient Instructions (Signed)
Medication Instructions:  No Changes *If you need a refill on your cardiac medications before your next appointment, please call your pharmacy*   Lab Work: No Labs If you have labs (blood work) drawn today and your tests are completely normal, you will receive your results only by: MyChart Message (if you have MyChart) OR A paper copy in the mail If you have any lab test that is abnormal or we need to change your treatment, we will call you to review the results.   Testing/Procedures: No Testing   Follow-Up: At [facility] you and your health needs are our priority.  As part of our continuing mission to provide you with exceptional heart care, we have created designated Provider Care Teams.  These Care Teams include your primary Cardiologist (physician) and Advanced Practice Providers (APPs -  Physician Assistants and Nurse Practitioners) who all work together to provide you with the care you need, when you need it.  We recommend signing up for the patient portal called "MyChart".  Sign up information is provided on this After Visit Summary.  MyChart is used to connect with patients for Virtual Visits (Telemedicine).  Patients are able to view lab/test results, encounter notes, upcoming appointments, etc.  Non-urgent messages can be sent to your provider as well.   To learn more about what you can do with MyChart, go to [email or web address].    Your next appointment:   6 month(s)  Provider:   Parke Poisson, MD

## 2023-03-14 ENCOUNTER — Encounter (INDEPENDENT_AMBULATORY_CARE_PROVIDER_SITE_OTHER): Payer: Self-pay

## 2023-03-15 ENCOUNTER — Ambulatory Visit (INDEPENDENT_AMBULATORY_CARE_PROVIDER_SITE_OTHER): Payer: Medicare Other | Admitting: Nurse Practitioner

## 2023-03-15 ENCOUNTER — Encounter (INDEPENDENT_AMBULATORY_CARE_PROVIDER_SITE_OTHER): Payer: Self-pay | Admitting: Nurse Practitioner

## 2023-03-15 VITALS — BP 125/68 | HR 77 | Temp 98.2°F | Ht 61.0 in | Wt 158.0 lb

## 2023-03-15 DIAGNOSIS — E119 Type 2 diabetes mellitus without complications: Secondary | ICD-10-CM

## 2023-03-15 LAB — POCT GLYCOSYLATED HEMOGLOBIN (HGB A1C): POCT Hgb A1C: 7.5 % — AB (ref 3.9–5.9)

## 2023-03-15 NOTE — Progress Notes (Unsigned)
Chief Complaint:  Follow up DM II    HPI:  Helen [name] is a 77 y.o. female with PMH Asthma, HTN, diabetes  who presents for follow up management of  type 2 diabetes.  Diabetes type 2 was diagnosed in 1991 on routine testing, and has been managed by PCP.      In terms of severity with regards to complications, the patient does not have retinopathy, does not have nephropathy, does not have neuropathy sees podiatrist once a week for callyces , and does not have cardiovascular disease.                 With regards to the quality of blood sugar control, the most recent hemoglobin A1c test was 7.5% on 03/15/23,  7.6 on 11/14/22.  The patient checks blood sugars one time per day  Blood sugar numbers have improved.  Current anti-hyperglycemic regimen is comprised of Tradjenta 5 mg daily, metformin 1000 mg bid, Glipizide 20 mg bid, Jardiance 25 mg daily .                 In terms of associated symptoms at this time, the patient does not have polyuria, does not have blurred vision, does not have paresthesias,  With regards to gastroparesis the patient does not have nausea and does not have diarrhea     Per the patient's history, last fundoscopic exam was in 2023 ( is going to schedule visit) and usually gets notice from Ophthalmology office for her next appointment. 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      .   Currently the patient's diet comprises of 2 meals per day.  Prior formal diabetes education/ nutritional counseling has not been performed.       Patient has not experienced any hypoglycemia symptoms.    Diabetes Medications:  Metformin 1000 mg twice daily  Glipizide 20 mg twice daily  Jardiance 25 mg daily  Tradjenta 5 mg daily    Diabetes Complications:  Podiatry: no neuropathy  Renal: None   Lab Results   Component Value Date    CREAT 0.87 11/14/2022    CREAT 0.86 05/24/2022     Ophthalmology: no retinopathy, no glaucoma, + cataracts, last visit in May 2024.    Cardiovascular: none    Lipids: TC 138 trig 58, LDL 62, HDL 64   Latest Reference Range & Units 11/14/22 00:00   Cholesterol 100 - 199 mg/dL 109   HDL >60 mg/dL 67   LDL Calculated 0 - 99 mg/dL 51   Triglycerides 0 - 149 mg/dL 47   Cholesterol / HDL Ratio 0.0 - 4.4 ratio 1.9   VLDL Calculated 5 - 40 mg/dL 11     Blood pressure: 125/68    Self-monitoring blood glucose:  Glucometer at home blood sugar staying at 100 before and after breakfast   Fasting: 127, 105, 132, 122, 127, 89, 108, 121, 122, 113, 116, 124  After meals: 128, 128, 122, 127, 143, 127, 166, 109, 128  24 Hour diet recall:  1) Breakfast: Scrambled eggs, [place] bacon and piece of wheat toast   2) Lunch: skip lunch, in the afternoon yogurt and piece of fruit like strawberries and grapes   3) Dinner: Fish and sweet potatoes, vegetables green beans and broccoli   Snacks: Granola bar with no sugar and cholesterol reading labels   Beverages: Water, lemonade with no sugar, tea, decaf coffee with 1% milk  Exercise:   Regular exercise walking three times/ week 1 hour each time      Labs:  Lab Results   Component Value Date    HGBA1C 7.6 (H) 11/14/2022    HGBA1C 7.8 (H) 05/24/2022    HGBA1C 8.4 (H) 02/19/2022          Problem List:  Patient Active Problem List   Diagnosis    Hypertension    Hyperlipidemia    H/O total hysterectomy with removal of both tubes and ovaries    Heart block AV first degree    RBBB (right bundle branch block)    Palpitations    Pure hypercholesterolemia, unspecified    Nonspecific abnormal electrocardiogram (ECG) (EKG)    Type 2 diabetes mellitus without complication, without long-term current use of insulin    Type 2 diabetes mellitus with hyperglycemia    Anemia    History of colonic polyps    Gastroesophageal reflux disease, unspecified whether esophagitis present       Current Medications:  Current Outpatient Medications on File Prior to Visit   Medication Sig Dispense Refill    albuterol (PROVENTIL) (2.5 MG/3ML) 0.083%  nebulizer solution TAKE 3 MLS (2.5 MG TOTAL) BY NEBULIZATION EVERY 4 (FOUR) HOURS AS NEEDED FOR SHORTNESS OF BREATH OR WHEEZING 75 mL 1    albuterol sulfate HFA (PROVENTIL) 108 (90 Base) MCG/ACT inhaler INHALE 2 PUFFS INTO THE LUNGS EVERY 4 HOURS AS NEEDED FOR WHEEZING OR SHORTNESS OF BREATH. 6.7 each 2    amLODIPine (NORVASC) 10 MG tablet TAKE 1 TABLET (10 MG) BY MOUTH DAILY. 90 tablet 3    atorvastatin (LIPITOR) 40 MG tablet TAKE 1 TABLET BY MOUTH EVERY DAY 90 tablet 3    benzonatate (TESSALON) 100 MG capsule Take 1 capsule (100 mg) by mouth 3 (three) times daily as needed for Cough 30 capsule 0    Blood Glucose Monitoring Suppl (ONE TOUCH ULTRA 2) W/DEVICE KIT One daily 1 each 0    Cholecalciferol (Vitamin D3) 2000 UNIT capsule Take 1 capsule (2,000 Units) by mouth daily 90 capsule 1    desonide (DESOWEN) 0.05 % cream  (Patient not taking: Reported on 12/11/2022)      dexAMETHasone (DECADRON) 0.1 % ophthalmic solution       diclofenac Sodium (VOLTAREN) 1 % Gel topical gel Apply 2 g topically 4 (four) times daily 50 g 0    empagliflozin (Jardiance) 25 MG tablet Take 1 tablet (25 mg) by mouth every morning 90 tablet 1    ferrous sulfate 325 (65 FE) MG tablet Take 1 tablet (325 mg) by mouth every morning with breakfast      fluocinonide (LIDEX) 0.05 % gel  (Patient not taking: Reported on 12/11/2022)      fluticasone (FLONASE) 50 MCG/ACT nasal spray 2 sprays by Nasal route daily      fluticasone (FLOVENT HFA) 220 MCG/ACT inhaler INHALE 1 PUFF INTO THE LUNGS TWICE A DAY 36 each 1    glipiZIDE (GLUCOTROL) 10 MG tablet Take 2 tablets (20 mg) by mouth 2 (two) times daily before meals 360 tablet 1    glucose blood (OneTouch Ultra) test strip 2 times daily 200 each 3    Lancets (onetouch ultrasoft) lancets Use to check blood sugars 2 times daily 200 each 3    lidocaine (Lidoderm) 5 % Place 1 patch onto the skin daily as needed (pain) Remove & Discard patch within 12 hours 30 patch 0    metFORMIN (GLUCOPHAGE) 1000 MG tablet  Take  1 tablet (1,000 mg) by mouth 2 (two) times daily 180 tablet 1    montelukast (SINGULAIR) 10 MG tablet Take 1 tablet (10 mg) by mouth daily      NASONEX 50 MCG/ACT nasal spray 2 sprays by Nasal route daily as needed  5    pantoprazole (PROTONIX) 40 MG tablet TAKE 1 TABLET BY MOUTH EVERY DAY 90 tablet 1    polyethylene glycol w/ electrolytes (MoviPrep) oral solution Take one packet following the instructions sent from your GI doctor's office. (Patient not taking: Reported on 12/11/2022) 1 each 0    Tradjenta 5 MG Tab TAKE 1 TABLET BY MOUTH EVERY DAY 90 tablet 1    valsartan-hydroCHLOROthiazide (DIOVAN-HCT) 160-12.5 MG per tablet TAKE 1 TABLET BY MOUTH EVERY DAY 90 tablet 1     No current facility-administered medications on file prior to visit.       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    Bilateral cataracts     Breast cancer     Breast lump     Cancer     breast cancer    Carcinoma     Carcinoma of the bladder    Chicken pox     Childhood illness    Conduction Disorder     RBBB    Diabetes mellitus type II     Non-Insulin dependent    Echocardiogram 10/2013    Gastroesophageal reflux disease     Hemorrhoids without complication     Holter monitor 02/2014    Hyperlipidemia     Hypertensive disorder     Malignant neoplasm of breast     Measles     Childhood illness    Mumps     Childhood illness    Myocardial perfusion scan 11/2013, 12/2013    MPI single Isotope excrise; MPI dual Isotope Lexiscan    RBBB     Spinal stenosis     Syncope        Past Surgical History:  Past Surgical History:   Procedure Laterality Date     ABLATION OF DYSRHYTHMIC FOCUS      ARTHROSCOPY, KNEE Left 04/21/2015    Procedure: ARTHROSCOPY, KNEE PARTIAL MEDIAL AND LATERAL MENISECTOMY LEFT KNEE;  Surgeon: Bess Kinds, MD;  Location: [facility];  Service: Orthopedics;  Laterality: Left;  partial medial menisectomy    BREAST BIOPSY      COLONOSCOPY, SCREENING N/A 12/07/2022    Procedure: COLONOSCOPY, SCREENING;  Surgeon: Leilani Merl, MD;  Location: [facility];  Service: Gastroenterology;  Laterality: N/A;    EGD N/A 12/07/2022    Procedure: EGD;  Surgeon: Leilani Merl, MD;  Location: [facility];  Service: Gastroenterology;  Laterality: N/A;    HYSTERECTOMY      total    TUMOR REMOVAL Left  neck  benign        Family History:  Family History   Problem Relation Age of Onset    Diabetes Mother     Diabetes Sister     Diabetes Sister     Malignant hyperthermia Neg Hx     Pseudochol deficiency Neg Hx     Anesthesia problems Neg Hx        Social History:  Social History     Socioeconomic History    Marital status: Divorced   Tobacco Use    Smoking status: Never    Smokeless tobacco: Never   Vaping Use    Vaping status: Never Used   Substance and Sexual Activity    Alcohol use: No    Drug use: Never    Sexual activity: Not Currently     Social Determinants of Health     Financial Resource Strain: High Risk (10/16/2022)    Overall Financial Resource Strain (CARDIA)     Difficulty of Paying Living Expenses: Hard   Food Insecurity: No Food Insecurity (12/07/2022)    Hunger Vital Sign     Worried About Running Out of Food in the Last Year: Never true     Ran Out of Food in the Last Year: Never true   Recent Concern: Food Insecurity - Food Insecurity Present (10/16/2022)    Hunger Vital Sign     Worried About Running Out of Food in the Last Year: Often true     Ran Out of Food in the Last Year: Often true   Transportation Needs: No Transportation Needs (12/07/2022)    PRAPARE - Therapist, art (Medical): No     Lack of Transportation  (Non-Medical): No   Physical Activity: Insufficiently Active (10/16/2022)    Exercise Vital Sign     Days of Exercise per Week: 3 days     Minutes of Exercise per Session: 30 min   Stress: No Stress Concern Present (10/16/2022)    [employer] of Occupational Health - Occupational Stress Questionnaire     Feeling of Stress : Not at all   Recent Concern: Stress - Stress Concern Present (09/06/2022)    [place] [employer] of Occupational Health - Occupational Stress Questionnaire     Feeling of Stress : Very much   Social Connections: Moderately Integrated (10/16/2022)    Social Connection and Isolation Panel [NHANES]     Frequency of Communication with Friends and Family: Three times a week     Frequency of Social Gatherings with Friends and Family: Once a week     Attends Religious Services: More than 4 times per year     Active Member of [employer] or Organizations: Yes     Attends Banker Meetings: Never     Marital Status: Divorced   Recent Concern: Social Connections - Moderately Isolated (09/06/2022)    Social Connection and Isolation Panel [NHANES]     Frequency of Communication with Friends and Family: Three times a week     Frequency of Social Gatherings with Friends and Family: Twice a week     Attends Religious Services: More than 4 times per year     Active Member of [employer] or Organizations: No     Attends Banker Meetings: Patient declined     Marital Status: Separated   Intimate Partner Violence: Not At Risk (12/07/2022)    Humiliation, Afraid, Rape, and Kick questionnaire     Fear of Current  or Ex-Partner: No     Emotionally Abused: No     Physically Abused: No     Sexually Abused: No   Housing Stability: Low Risk  (12/07/2022)    Housing Stability Vital Sign     Unable to Pay for Housing in the Last Year: No     Number of Places Lived in the Last Year: 1     Unstable Housing in the Last Year: No       ROS:   General: Denies weight gain or weight loss, Denies fatigue  Ophthalmologic: Denies  blurry vision, double vision, eye pain  ENT: Denies difficulty swallowing, voice changes  Endocrine: Denies polyuria, increased thirst  Respiratory: Denies cough, shortness of breath   Cardiovascular: Denies palpitations, chest pain, chest pain with exertion, leg claudication, leg edema  Gastrointestinal: Denies abdominal pain, constipation, diarrhea, nausea, vomiting  Genitourinary: Denies frequent urination, painful urination, denies yeast infection  Musculoskeletal: Denies back pain, joint pain  Skin: Denies rash  Neurologic: Denies tingling, numbness of lower extremities and upper extremities  Psychiatric: Denies anxiety, depressed mood, hypersomnolence, insomnia      There were no vitals taken for this visit.       BP Readings from Last 3 Encounters:   12/11/22 126/70   12/07/22 127/57   11/07/22 115/70        Wt Readings from Last 4 Encounters:   12/11/22 1039 70.8 kg (156 lb)   12/07/22 0822 69.4 kg (153 lb)   11/07/22 1257 71.2 kg (157 lb)   10/16/22 1057 69.6 kg (153 lb 6.4 oz)       Physical Exam:  GENERAL APPEARANCE: alert, in no acute distress, well developed, well nourished  NECK/THYROID: neck supple  PSYCHIATRIC: normal mood, appropriate affect  Pulmonary: Breathing normally with no wheezes  Neuro: A/O X 3        Assessment/Plan:  Helen [name] is a 77 y.o. female with    No diagnosis found.      1. Controlled type 2 diabetes mellitus without complication, without long-term current use of insulin  Diabetes type 2:    Based on unknown blood sugar pattern of  fasting sugars and prandial/post-prandial sugars and controlled Hgb A1C level, will continue medications as follows: continue metformin 1000 mg bid with meals, Continue Glipizide 20 mg bid, Continue Tradjenta 5 mg daily and Jardiance 25 mg daily.  Check sugars AM fasting and 2 hours after biggest meal of day with goal fasting/pre-meal range of 80-130; goal postprandial range of 80-160.  Is due for eye exam in coming months; recommended to make an  appointment with Ophthalmologist,  Continue to see podiatry for foot care.  Continue carbohydrate-controlled diet and exercise daily.      2. Hypertension (stable): goal blood pressure is <130/80.  Does not check BP at home  Continue Valsartan- HCT 160-12.5 mg daily  Continue Amlodipine 10 mg daily     3. Hyperlipidemia (stable): controlled on Lipitor 40 mg daily; goal LDL<100.  Continue low cholesterol, low saturated fat diet.  Continue Lipitor 40 mg daily     4. Overweight status (stable): counseled patient on at least 150 min/wk of moderate-intensity exercise and consider flexibility/strength training exercises if possible.  Physical activity programs should begin slowly and build up gradually; nutritional counseling status.     5. Hypoglycemia management (stable): counseled patient on need to keep sugar source with her at all times and medic-alert bracelet/indicator specifying diagnosis of diabetes  Educated patient about hypoglycemia  symptoms and management of hypoglycemia                   Follow up in 3 months.     Thea Gist, NP

## 2023-03-15 NOTE — Patient Instructions (Signed)
Continue Metformin 1000 mg twice daily  Continue Tradjenta 5 mg daily  Continue Glipizide 20 mg twice daily  Continue Jardiance 25 mg daily  Follow up for labs with PCP  Follow up in 6 months  Use website of American diabetes association for diabetic recipes and plates and Calorieking for carbohydrate counting     I provided the patient information on carbohydrate content of foods, instructed to maintain a low carbohydrate diet of 30-45g/meal, + 30g for snacks /day, and to maximize foods high in protein and vegetables.  I discussed with the patient blood glucose targets, HgbA1c goals, and the importance of timing of SMBGs at home.  I discussed medication treatment options, insulin injection technique, and insulin action.  Finally, I reviewed with the patient hypoglycemia (BG < 70), hypoglycemic symptoms and management of hypoglycemia (rule of 15s).    HOW TO TREAT HYPOGLYCEMIA (LOW BLOOD SUGAR) LESS THAN 70 MG/DL:  Follow rule of 15 for treating low blood sugar less than 70 mg/dl.     1.) Take a 15 gram rapidly acting carbohydrates in the form of half cup fruit juice or 3-4 glucose tablets or 1 tube of glucose gel, available over the counter at the pharmacy  2.) Check blood sugar in 15 minutes  3.) If blood sugar is less than 70 mg/dl, repeat the rule of 15 as in step 1.  4.) Once blood sugar is greater than 70 mg/dl, eat a balanced meal or a snack with starch, protein and fat. For example, 4-5 peanut butter crackers or 4-5 cheese crackers or fiber bar.     NOTIFY us - if you continue to have blood sugars less than 70 mg/dl.   WEIGHT LOSS AND DIETARY RECOMMENDATIONS    A 500 kcal/day reduction in caloric intake is needed in order to reduce body weight by 1 lb/wk    Restrict saturated fat intake to 7% of total calories    Restrict trans fat to ? 1% of total calories, cholesterol to 200 mg/day    Increase intake of food containing stanols/sterols (up to 2 g/d).     Add viscous dietary fiber (10-14 g /1,000 kcal)      Decrease body weight by 5-10% to reduce cardiovascular and metabolic risk.    Long-term goal of reducing and maintaining waist circumference within normal age- and sex-specific range.       Perform at least 150 min per week of moderate-intensity (50-70% age-adjusted mean heart rate) aerobic physical activity    Perform resistance training three times per week    Physical activity programs should begin slowly and build up gradually

## 2023-03-16 ENCOUNTER — Encounter (INDEPENDENT_AMBULATORY_CARE_PROVIDER_SITE_OTHER): Payer: Self-pay | Admitting: Nurse Practitioner

## 2023-03-19 ENCOUNTER — Ambulatory Visit (INDEPENDENT_AMBULATORY_CARE_PROVIDER_SITE_OTHER): Payer: Medicare Other | Admitting: Family Medicine

## 2023-03-19 ENCOUNTER — Encounter: Payer: Self-pay | Admitting: Family Medicine

## 2023-03-19 VITALS — BP 120/72 | HR 76 | Temp 98.0°F | Ht 66.0 in | Wt 164.0 lb

## 2023-03-19 DIAGNOSIS — Z136 Encounter for screening for cardiovascular disorders: Secondary | ICD-10-CM | POA: Diagnosis not present

## 2023-03-19 DIAGNOSIS — E038 Other specified hypothyroidism: Secondary | ICD-10-CM | POA: Diagnosis not present

## 2023-03-19 DIAGNOSIS — Z0001 Encounter for general adult medical examination with abnormal findings: Secondary | ICD-10-CM | POA: Diagnosis not present

## 2023-03-19 DIAGNOSIS — N1831 Chronic kidney disease, stage 3a: Secondary | ICD-10-CM | POA: Diagnosis not present

## 2023-03-19 DIAGNOSIS — Z0000 Encounter for general adult medical examination without abnormal findings: Secondary | ICD-10-CM

## 2023-03-19 DIAGNOSIS — R5383 Other fatigue: Secondary | ICD-10-CM | POA: Diagnosis not present

## 2023-03-19 DIAGNOSIS — I10 Essential (primary) hypertension: Secondary | ICD-10-CM

## 2023-03-19 LAB — URINALYSIS, ROUTINE W REFLEX MICROSCOPIC
Bilirubin Urine: NEGATIVE
Hgb urine dipstick: NEGATIVE
Ketones, ur: NEGATIVE
Leukocytes,Ua: NEGATIVE
Nitrite: NEGATIVE
Protein, ur: NEGATIVE
Specific Gravity, Urine: 1.015 (ref 1.001–1.035)
pH: 6.5 (ref 5.0–8.0)

## 2023-03-19 LAB — CBC WITH DIFFERENTIAL/PLATELET
Basophils Absolute: 30 cells/uL (ref 0–200)
Platelets: 220 10*3/uL (ref 140–400)

## 2023-03-19 NOTE — Progress Notes (Signed)
Subjective:    Patient ID: April Beck, female    DOB: [birth year], 77 y.o.   MRN: [id number]  Patient is a 77 year old Caucasian female with a history of chronic kidney disease and hypertension who is here today for physical exam.  Her gynecologist performs her pelvic exam.  They also get her mammogram.  They also perform her bone density.  Her last colonoscopy was in 2020.  She is due for repeat colonoscopy in 2030.  At that time she will be 77 years old.  Therefore she does not require another colonoscopy.  I reviewed her immunization records.  All of her immunizations are up-to-date. Immunization History  Administered Date(s) Administered   COVID-19, mRNA, vaccine(Comirnaty)12 years and older 06/20/2022   Fluad Quad(high Dose 65+) 05/21/2019, 06/19/2021, 06/15/2022   Influenza Split 06/20/2012, 06/29/2020   Influenza, High Dose Seasonal PF 06/20/2017, 07/11/2018, 06/29/2020   Influenza,inj,Quad PF,6+ Mos 06/01/2013, 05/13/2014, 06/27/2015   PFIZER(Purple Top)SARS-COV-2 Vaccination 11/30/2019, 12/23/2019, 06/07/2020   Pfizer Covid-19 Vaccine Bivalent Booster 25yrs & up 07/04/2021   Pneumococcal Conjugate-13 10/23/2013   Pneumococcal Polysaccharide-23 06/20/2012   Respiratory Syncytial Virus Vaccine,Recomb Aduvanted(Arexvy) 06/27/2022   Tdap 06/20/2017   Zoster Recombinant(Shingrix) 08/29/2017, 12/13/2017   Zoster, Live 08/04/2009   She does report to the.  Outside of her fatigue her review of systems is negative.  She did recently have a urinary tract infection repeat urinalysis today shows no nitrates, no leukocyte esterase, and no blood indicating resolution Past Medical History:  Diagnosis Date   Anxiety    hx of panic attack   Arthritis    hands & knees   CKD (chronic kidney disease), stage III (HCC)    followed by pcp   Closed fracture of left distal radius 10/31/2021   Edema of both lower extremities    GERD (gastroesophageal reflux disease)    History of 2019 novel  coronavirus disease (COVID-19) 09/17/2020   positive result in epic,  per pt very mild symtpoms that resolved   History of uterine fibroid    Hypertension    followed by pcp   Hypomagnesemia    Hypothyroidism    followed by pcp   IDA (iron deficiency anemia)    Osteopenia    Ovarian cyst    Pelvic pain    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, takes meloxicam   Wears contact lenses    Past Surgical History:  Procedure Laterality Date   ABDOMINAL HYSTERECTOMY  1982   CATARACT EXTRACTION Bilateral    CHOLECYSTECTOMY  06/20/2012   Procedure: LAPAROSCOPIC CHOLECYSTECTOMY;  Surgeon: Liz Malady, MD;  Location: [facility];  Service: General;  Laterality: N/A;   COLONOSCOPY  last one 03-20-2019  dr Christella Hartigan   DIAGNOSTIC LAPAROSCOPY  yrs ago   HIP ARTHROPLASTY Right 11/13/2020   Procedure: ARTHROPLASTY BIPOLAR HIP (HEMIARTHROPLASTY);  Surgeon: Vickki Hearing, MD;  Location: [facility];  Service: Orthopedics;  Laterality: Right;   INCONTINENCE SURGERY  09-11-2005   dr Annabell Howells  @[facility]    LYNX SLING   LAPAROSCOPIC SALPINGO OOPHERECTOMY Bilateral 01/16/2021   Procedure: DIAGNOSTIC LAPAROSCOPY;  Surgeon: Richarda Overlie, MD;  Location: [facility];  Service: Gynecology;  Laterality: Bilateral;   LAPAROSCOPY Bilateral 01/16/2021   Procedure: EXPLORATORY LAPAROTOMY BILATERAL SALPINGO OOPHORECTOMY;  Surgeon: Richarda Overlie, MD;  Location: [facility];  Service: Gynecology;  Laterality: Bilateral;   TUBAL LIGATION     Current Outpatient Medications on File Prior to Visit  Medication Sig  Dispense Refill   acetaminophen (TYLENOL) 325 MG tablet Take 2 tablets (650 mg total) by mouth every 6 (six) hours as needed for pain.     acyclovir (ZOVIRAX) 400 MG tablet TAKE 1 TABLET EVERY DAY 90 tablet 3   atorvastatin (LIPITOR) 40 MG tablet TAKE 1 TABLET EVERY DAY 90 tablet 3   calcium-vitamin D (OSCAL WITH D) 250-125 MG-UNIT tablet Take 1 tablet by mouth  daily.     citalopram (CELEXA) 10 MG tablet TAKE 1 TABLET EVERY DAY 90 tablet 3   fluocinonide gel (LIDEX) 0.05 % Apply 1 application topically 3 (three) times daily as needed (rash).     levothyroxine (SYNTHROID) 50 MCG tablet TAKE 1 TABLET EVERY DAY 90 tablet 2   losartan (COZAAR) 50 MG tablet Take 1 tablet (50 mg total) by mouth daily. 90 tablet 1   pantoprazole (PROTONIX) 40 MG tablet TAKE 1 TABLET TWICE DAILY 180 tablet 2   triamcinolone cream (KENALOG) 0.1 % Apply 1 Application topically 2 (two) times daily. 30 g 0   No current facility-administered medications on file prior to visit.   No Known Allergies Social History   Socioeconomic History   Marital status: Divorced    Spouse name: Not on file   Number of children: Not on file   Years of education: Not on file   Highest education level: 12th grade  Occupational History   Not on file  Tobacco Use   Smoking status: Never   Smokeless tobacco: Never  Vaping Use   Vaping status: Never Used  Substance and Sexual Activity   Alcohol use: No   Drug use: Never   Sexual activity: Not on file  Other Topics Concern   Not on file  Social History Narrative   Not on file   Social Determinants of Health   Financial Resource Strain: Low Risk  (12/14/2022)   Overall Financial Resource Strain (CARDIA)    Difficulty of Paying Living Expenses: Not very hard  Food Insecurity: Unknown (12/14/2022)   Hunger Vital Sign    Worried About Running Out of Food in the Last Year: Never true    Ran Out of Food in the Last Year: Patient declined  Transportation Needs: No Transportation Needs (12/14/2022)   PRAPARE - Administrator, Civil Service (Medical): No    Lack of Transportation (Non-Medical): No  Physical Activity: Unknown (12/14/2022)   Exercise Vital Sign    Days of Exercise per Week: Patient declined    Minutes of Exercise per Session: Not on file  Stress: No Stress Concern Present (12/14/2022)   [employer] of  Occupational Health - Occupational Stress Questionnaire    Feeling of Stress : Not at all  Social Connections: Moderately Isolated (12/14/2022)   Social Connection and Isolation Panel [NHANES]    Frequency of Communication with Friends and Family: More than three times a week    Frequency of Social Gatherings with Friends and Family: Twice a week    Attends Religious Services: More than 4 times per year    Active Member of [employer] or Organizations: No    Attends Banker Meetings: Not on file    Marital Status: Divorced  Intimate Partner Violence: Not At Risk (02/23/2021)   Humiliation, Afraid, Rape, and Kick questionnaire    Fear of Current or Ex-Partner: No    Emotionally Abused: No    Physically Abused: No    Sexually Abused: No       Review of Systems  All other systems reviewed and are negative.      Objective:   Physical Exam Vitals reviewed.  Constitutional:      General: She is not in acute distress.    Appearance: She is well-developed. She is not diaphoretic.  HENT:     Head: Normocephalic and atraumatic.     Right Ear: External ear normal.     Left Ear: External ear normal.     Nose: Nose normal.     Mouth/Throat:     Pharynx: No oropharyngeal exudate.  Eyes:     General: No scleral icterus.       Right eye: No discharge.        Left eye: No discharge.     Conjunctiva/sclera: Conjunctivae normal.     Pupils: Pupils are equal, round, and reactive to light.  Neck:     Thyroid: No thyromegaly.     Vascular: No JVD.     Trachea: No tracheal deviation.  Cardiovascular:     Rate and Rhythm: Normal rate and regular rhythm.     Heart sounds: Normal heart sounds. No murmur heard.    No friction rub. No gallop.  Pulmonary:     Effort: Pulmonary effort is normal. No respiratory distress.     Breath sounds: Normal breath sounds. No stridor. No wheezing or rales.  Chest:     Chest wall: No tenderness.  Abdominal:     General: Bowel sounds are normal.  There is no distension.     Palpations: Abdomen is soft. There is no mass.     Tenderness: There is no abdominal tenderness. There is no guarding or rebound.  Musculoskeletal:        General: No tenderness or deformity.     Cervical back: Normal range of motion and neck supple.  Lymphadenopathy:     Cervical: No cervical adenopathy.  Skin:    General: Skin is warm.     Coloration: Skin is not pale.     Findings: No erythema or rash.  Neurological:     Mental Status: She is alert and oriented to person, place, and time.     Cranial Nerves: No cranial nerve deficit.     Motor: No abnormal muscle tone.     Coordination: Coordination normal.     Deep Tendon Reflexes: Reflexes are normal and symmetric. Reflexes normal.  Psychiatric:        Behavior: Behavior normal.        Thought Content: Thought content normal.        Judgment: Judgment normal.           Assessment & Plan:  Stage 3a chronic kidney disease (HCC) - Plan: Urinalysis, Routine w reflex microscopic, Vitamin B12, CBC with Differential/Platelet, COMPLETE METABOLIC PANEL WITH GFR, Lipid panel, TSH  Fatigue, unspecified type - Plan: Vitamin B12, CBC with Differential/Platelet, COMPLETE METABOLIC PANEL WITH GFR, Lipid panel, TSH  Benign essential HTN  Other specified hypothyroidism  General medical exam Physical exam today is normal.  Urinalysis shows resolution of her urinary tract infection.  Immunizations are up-to-date.  She denies any falls, depression, memory loss.  I will check CBC, CMP, lipid panel, and due to her fatigue, TSH and B12.  Immunizations are up-to-date.  If labs are normal/stable no changes will be made in current medical regimen.  Defer mammogram and bone density test to gynecology.

## 2023-03-20 LAB — CBC WITH DIFFERENTIAL/PLATELET
Absolute Monocytes: 490 cells/uL (ref 200–950)
Basophils Relative: 0.6 %
Eosinophils Absolute: 230 cells/uL (ref 15–500)
Eosinophils Relative: 4.6 %
HCT: 36.2 % (ref 35.0–45.0)
Hemoglobin: 11.9 g/dL (ref 11.7–15.5)
Lymphs Abs: 1700 cells/uL (ref 850–3900)
MCH: 31.5 pg (ref 27.0–33.0)
MCHC: 32.9 g/dL (ref 32.0–36.0)
MCV: 95.8 fL (ref 80.0–100.0)
MPV: 9.6 fL (ref 7.5–12.5)
Monocytes Relative: 9.8 %
Neutro Abs: 2550 cells/uL (ref 1500–7800)
Neutrophils Relative %: 51 %
RBC: 3.78 10*6/uL — ABNORMAL LOW (ref 3.80–5.10)
RDW: 11.4 % (ref 11.0–15.0)
Total Lymphocyte: 34 %
WBC: 5 10*3/uL (ref 3.8–10.8)

## 2023-03-20 LAB — COMPLETE METABOLIC PANEL WITH GFR
AG Ratio: 1.9 (calc) (ref 1.0–2.5)
ALT: 14 U/L (ref 6–29)
AST: 17 U/L (ref 10–35)
Albumin: 4.2 g/dL (ref 3.6–5.1)
Alkaline phosphatase (APISO): 61 U/L (ref 37–153)
BUN: 15 mg/dL (ref 7–25)
CO2: 30 mmol/L (ref 20–32)
Calcium: 9.8 mg/dL (ref 8.6–10.4)
Chloride: 106 mmol/L (ref 98–110)
Creat: 0.8 mg/dL (ref 0.60–1.00)
Globulin: 2.2 g/dL (calc) (ref 1.9–3.7)
Glucose, Bld: 95 mg/dL (ref 65–99)
Potassium: 4.2 mmol/L (ref 3.5–5.3)
Sodium: 143 mmol/L (ref 135–146)
Total Bilirubin: 0.5 mg/dL (ref 0.2–1.2)
Total Protein: 6.4 g/dL (ref 6.1–8.1)
eGFR: 76 mL/min/{1.73_m2} (ref >=60)

## 2023-03-20 LAB — LIPID PANEL
Cholesterol: 107 mg/dL (ref <200)
HDL: 43 mg/dL — ABNORMAL LOW (ref >=50)
LDL Cholesterol (Calc): 41 mg/dL (calc)
Non-HDL Cholesterol (Calc): 64 mg/dL (calc) (ref <130)
Total CHOL/HDL Ratio: 2.5 (calc) (ref <5.0)
Triglycerides: 143 mg/dL (ref <150)

## 2023-03-20 LAB — TSH: TSH: 0.57 mIU/L (ref 0.40–4.50)

## 2023-03-20 LAB — VITAMIN B12: Vitamin B-12: 585 pg/mL (ref 200–1100)

## 2023-03-27 ENCOUNTER — Other Ambulatory Visit (INDEPENDENT_AMBULATORY_CARE_PROVIDER_SITE_OTHER): Payer: Self-pay | Admitting: Nurse Practitioner

## 2023-03-27 DIAGNOSIS — E119 Type 2 diabetes mellitus without complications: Secondary | ICD-10-CM

## 2023-03-28 ENCOUNTER — Encounter: Payer: Self-pay | Admitting: Emergency Medicine

## 2023-03-28 ENCOUNTER — Encounter: Payer: Self-pay | Admitting: Family Medicine

## 2023-03-28 ENCOUNTER — Ambulatory Visit: Payer: Self-pay

## 2023-03-28 ENCOUNTER — Telehealth: Payer: Self-pay

## 2023-03-28 ENCOUNTER — Ambulatory Visit
Admission: EM | Admit: 2023-03-28 | Discharge: 2023-03-28 | Disposition: A | Discharge status: Home / Self Care | Payer: Medicare Other | Attending: Internal Medicine | Admitting: Internal Medicine

## 2023-03-28 ENCOUNTER — Ambulatory Visit (INDEPENDENT_AMBULATORY_CARE_PROVIDER_SITE_OTHER): Payer: Medicare Other | Admitting: Endocrinology, Diabetes and Metabolism

## 2023-03-28 DIAGNOSIS — R300 Dysuria: Secondary | ICD-10-CM | POA: Diagnosis not present

## 2023-03-28 LAB — POCT URINALYSIS DIP (MANUAL ENTRY)
Bilirubin, UA: NEGATIVE
Glucose, UA: 100 mg/dL — AB
Ketones, POC UA: NEGATIVE mg/dL
Nitrite, UA: POSITIVE — AB
Protein Ur, POC: 30 mg/dL — AB
Spec Grav, UA: 1.01 (ref 1.010–1.025)
Urobilinogen, UA: 2 E.U./dL — AB
pH, UA: 7 (ref 5.0–8.0)

## 2023-03-28 MED ORDER — SULFAMETHOXAZOLE-TRIMETHOPRIM 800-160 MG PO TABS: 1.0000 | ORAL_TABLET | Freq: Two times a day (BID) | ORAL | 0 refills | Status: AC

## 2023-03-28 NOTE — ED Triage Notes (Signed)
Urinary pressure and pain on urination since Wednesday.  States she has had 2 previous UTIs in June and July.  States has been taking urostat for symptoms.

## 2023-03-28 NOTE — Telephone Encounter (Signed)
My Chart message from patient:  I have developed yet another bladder infection. It started Wednesday night around 9:30 pm. This is the third one in two months. I have an appointment with [facility] in [place] in the morning at 11:00. Dr Tanya Nones said to let him know if I got another bladder infection. Thank you.

## 2023-03-28 NOTE — ED Provider Notes (Signed)
[facility]    CSN: [id number] Arrival date & time: 03/28/23  1031      History   Chief Complaint No chief complaint on file.   HPI April Beck is a 77 y.o. female who presents with onset of dysuria since last night.  Has had UTI's in June at which time she took Keflex, and 7/8th at which time she took Macrobid but neither time did she saw an MD. This month she just called her GYN who called the Macrobid. She had a UA FU with PCP 7/16  and was negative. Has been taking Urostat. Denies chills, fever or back pain. She admits she has had some issues with constipation, but is passing just balls, though today she had a good BM.    Past Medical History:  Diagnosis Date   Anxiety    hx of panic attack   Arthritis    hands & knees   CKD (chronic kidney disease), stage III (HCC)    followed by pcp   Closed fracture of left distal radius 10/31/2021   Edema of both lower extremities    GERD (gastroesophageal reflux disease)    History of 2019 novel coronavirus disease (COVID-19) 09/17/2020   positive result in epic,  per pt very mild symtpoms that resolved   History of uterine fibroid    Hypertension    followed by pcp   Hypomagnesemia    Hypothyroidism    followed by pcp   IDA (iron deficiency anemia)    Osteopenia    Ovarian cyst    Pelvic pain    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, takes meloxicam   Wears contact lenses     Patient Active Problem List   Diagnosis Date Noted   Arthritis of carpometacarpal [facility]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 01/16/2022   Closed fracture of left distal radius 10/31/2021   Ovarian cyst 01/16/2021   Chronic constipation 11/21/2020   CKD (chronic kidney disease) stage 3, GFR 30-59 ml/min (HCC) 11/21/2020   Herpes 11/21/2020   Hypokalemia 11/21/2020   Acute blood loss anemia 11/21/2020   Weakness 11/17/2020   Aspiration into airway    Fall    Closed fracture of right hip (HCC) 11/12/2020   Dysphagia,  unspecified(787.20) 06/14/2013   Heel pain 06/14/2013   Hypercholesteremia    Hypertension    GERD (gastroesophageal reflux disease)    Hypothyroidism    Renal insufficiency 06/19/2012    Past Surgical History:  Procedure Laterality Date   ABDOMINAL HYSTERECTOMY  1982   CATARACT EXTRACTION Bilateral    CHOLECYSTECTOMY  06/20/2012   Procedure: LAPAROSCOPIC CHOLECYSTECTOMY;  Surgeon: Liz Malady, MD;  Location: [facility];  Service: General;  Laterality: N/A;   COLONOSCOPY  last one 03-20-2019  dr Christella Hartigan   DIAGNOSTIC LAPAROSCOPY  yrs ago   HIP ARTHROPLASTY Right 11/13/2020   Procedure: ARTHROPLASTY BIPOLAR HIP (HEMIARTHROPLASTY);  Surgeon: Vickki Hearing, MD;  Location: [facility];  Service: Orthopedics;  Laterality: Right;   INCONTINENCE SURGERY  09-11-2005   dr Annabell Howells  @[facility]    LYNX SLING   LAPAROSCOPIC SALPINGO OOPHERECTOMY Bilateral 01/16/2021   Procedure: DIAGNOSTIC LAPAROSCOPY;  Surgeon: Richarda Overlie, MD;  Location: [facility];  Service: Gynecology;  Laterality: Bilateral;   LAPAROSCOPY Bilateral 01/16/2021   Procedure: EXPLORATORY LAPAROTOMY BILATERAL SALPINGO OOPHORECTOMY;  Surgeon: Richarda Overlie, MD;  Location: [facility];  Service: Gynecology;  Laterality: Bilateral;   TUBAL LIGATION  OB History     Gravida  2   Para  2   Term      Preterm      AB      Living  2      SAB      IAB      Ectopic      Multiple      Live Births               Home Medications    Prior to Admission medications   Medication Sig Start Date End Date Taking? Authorizing Provider  sulfamethoxazole-trimethoprim (BACTRIM DS) 800-160 MG tablet Take 1 tablet by mouth 2 (two) times daily for 7 days. 03/28/23 04/04/23 Yes Rodriguez-Southworth, Nettie Elm, PA-C  acetaminophen (TYLENOL) 325 MG tablet Take 2 tablets (650 mg total) by mouth every 6 (six) hours as needed for pain. 06/21/12   Sherrie George, PA-C  acyclovir (ZOVIRAX) 400 MG  tablet TAKE 1 TABLET EVERY DAY 06/27/22   Donita Brooks, MD  atorvastatin (LIPITOR) 40 MG tablet TAKE 1 TABLET EVERY DAY 06/27/22   Donita Brooks, MD  calcium-vitamin D (OSCAL WITH D) 250-125 MG-UNIT tablet Take 1 tablet by mouth daily.    [provider]  citalopram (CELEXA) 10 MG tablet TAKE 1 TABLET EVERY DAY 06/27/22   Donita Brooks, MD  fluocinonide gel (LIDEX) 0.05 % Apply 1 application topically 3 (three) times daily as needed (rash). 09/08/20   [provider]  levothyroxine (SYNTHROID) 50 MCG tablet TAKE 1 TABLET EVERY DAY 06/21/22   Donita Brooks, MD  losartan (COZAAR) 50 MG tablet Take 1 tablet (50 mg total) by mouth daily. 01/30/23   Donita Brooks, MD  pantoprazole (PROTONIX) 40 MG tablet TAKE 1 TABLET TWICE DAILY 06/21/22   Donita Brooks, MD  triamcinolone cream (KENALOG) 0.1 % Apply 1 Application topically 2 (two) times daily. 12/17/22   Donita Brooks, MD    Family History Family History  Problem Relation Age of Onset   Colon polyps Brother    Breast cancer Maternal Grandmother    Diabetes Brother    Hypertension Brother    Colon cancer Neg Hx    Esophageal cancer Neg Hx    Rectal cancer Neg Hx    Stomach cancer Neg Hx     Social History Social History   Tobacco Use   Smoking status: Never   Smokeless tobacco: Never  Vaping Use   Vaping status: Never Used  Substance Use Topics   Alcohol use: No   Drug use: Never     Allergies   Patient has no known allergies.   Review of Systems Review of Systems  Constitutional:  Negative for chills and fever.  Gastrointestinal:  Negative for abdominal pain.  Genitourinary:  Positive for dysuria, frequency and urgency. Negative for difficulty urinating, flank pain and hematuria.     Physical Exam Triage Vital Signs ED Triage Vitals  Encounter Vitals Group     BP 03/28/23 1035 (!) 154/76     Systolic BP Percentile --      Diastolic BP Percentile --      Pulse Rate  03/28/23 1035 79     Resp 03/28/23 1035 18     Temp 03/28/23 1035 98.2 F (36.8 C)     Temp Source 03/28/23 1035 Oral     SpO2 03/28/23 1035 90 %     Weight --      Height --  Head Circumference --      Peak Flow --      Pain Score 03/28/23 1040 6     Pain Loc --      Pain Education --      Exclude from Growth Chart --    No data found.  Updated Vital Signs BP (!) 154/76 (BP Location: Right Arm)   Pulse 79   Temp 98.2 F (36.8 C) (Oral)   Resp 18   SpO2 90%   Visual Acuity Right Eye Distance:   Left Eye Distance:   Bilateral Distance:    Right Eye Near:   Left Eye Near:    Bilateral Near:       Physical Exam Vitals and nursing note reviewed.  Constitutional:      General: She is not in acute distress.    Appearance: She is not toxic-appearing.  HENT:     Head: Normocephalic.     Right Ear: External ear normal.     Left Ear: External ear normal.  Eyes:     General: No scleral icterus.    Conjunctiva/sclera: Conjunctivae normal.  Pulmonary:     Effort: Pulmonary effort is normal.  Abdominal:     General: Bowel sounds are normal.     Palpations: Abdomen is soft. There is no mass.     Tenderness: There is no guarding or rebound.     Comments: - CVA tenderness   Musculoskeletal:        General: Normal range of motion.     Neck is supple.  Skin:    General: Skin is warm and dry.     Findings: No rash.  Neurological:     Mental Status: She is alert and oriented to person, place, and time.     Gait: Gait normal.  Psychiatric:        Mood and Affect: Mood normal.        Behavior: Behavior normal.        Thought Content: Thought content normal.        Judgment: Judgment normal.   UC Treatments / Results  Labs (all labs ordered are listed, but only abnormal results are displayed) Labs Reviewed  POCT URINALYSIS DIP (MANUAL ENTRY) - Abnormal; Notable for the following components:      Result Value   Color, UA orange (*)    Glucose, UA =100 (*)     Blood, UA moderate (*)    Protein Ur, POC =30 (*)    Urobilinogen, UA 2.0 (*)    Nitrite, UA Positive (*)    Leukocytes, UA Large (3+) (*)    All other components within normal limits  URINE CULTURE    EKG   Radiology No results found.  Procedures Procedures (including critical care time)  Medications Ordered in UC Medications - No data to display  Initial Impression / Assessment and Plan / UC Course  I have reviewed the triage vital signs and the nursing notes.  Pertinent labs  results that were available during my care of the patient were reviewed by me and considered in my medical decision making (see chart for details).  Dysuria  I sent the urine for a culture In the mean time I placed her on Bactrim DS as noted We will call her if we need to make any changes with her medication.    Final Clinical Impressions(s) / UC Diagnoses   Final diagnoses:  Dysuria   Discharge Instructions   None  ED Prescriptions     Medication Sig Dispense Auth. Provider   sulfamethoxazole-trimethoprim (BACTRIM DS) 800-160 MG tablet Take 1 tablet by mouth 2 (two) times daily for 7 days. 14 tablet Rodriguez-Southworth, Nettie Elm, PA-C      PDMP not reviewed this encounter.   Garey Ham, PA-C 03/28/23 1110

## 2023-03-30 ENCOUNTER — Encounter: Payer: Self-pay | Admitting: Family Medicine

## 2023-04-01 NOTE — Telephone Encounter (Signed)
Called and spoke w/pt w/pcp's response. Pt aware and voiced understanding. Nothing further.

## 2023-04-02 ENCOUNTER — Ambulatory Visit (INDEPENDENT_AMBULATORY_CARE_PROVIDER_SITE_OTHER): Payer: Self-pay

## 2023-04-02 DIAGNOSIS — E119 Type 2 diabetes mellitus without complications: Secondary | ICD-10-CM

## 2023-04-02 DIAGNOSIS — I10 Essential (primary) hypertension: Secondary | ICD-10-CM

## 2023-04-05 ENCOUNTER — Ambulatory Visit: Payer: Self-pay

## 2023-04-05 NOTE — Telephone Encounter (Signed)
  Chief Complaint: Urinary pressure and frequency Symptoms: above Frequency: 03/27/2023 Pertinent Negatives: Patient denies Fever, pain Disposition: [] ED /[] Urgent Care (no appt availability in office) / [x] Appointment(In office/virtual)/ []  [place] [facility]/ [] Hom[facility] e Care/ [] Refused Recommended Disposition /[] [place] [facility]/  Follow-up with PCP Additional Notes: Pt started abx on 03/28/2023. Pt is a bit better, however is still having pressure all the time and frequency. Pt has appt on Monday. Pt would like additional abx. Pt will follow advice, and seek care over the weekend if s/s worsen.  Reason for Disposition  [1] Reasonable improvement on antibiotics AND [2] no fever  Answer Assessment - Initial Assessment Questions 1. MAIN SYMPTOM: "What is the main symptom you are concerned about?" (e.g., painful urination, urine frequency)     Pressure, frequency 2. BETTER-SAME-WORSE: "Are you getting better, staying the same, or getting worse compared to how you felt at your last visit to the doctor (most recent medical visit)?"     better 3. PAIN: "How bad is the pain?"  (e.g., Scale 1-10; mild, moderate, or severe)   - MILD (1-3): complains slightly about urination hurting   - MODERATE (4-7): interferes with normal activities     - SEVERE (8-10): excruciating, unwilling or unable to urinate because of the pain      No pain 4. FEVER: "Do you have a fever?" If Yes, ask: "What is it, how was it measured, and when did it start?"     no 5. OTHER SYMPTOMS: "Do you have any other symptoms?" (e.g., blood in the urine, flank pain, vaginal discharge)     none 6. DIAGNOSIS: "When was the UTI diagnosed?" "By whom?" "Was it a kidney infection, bladder infection or both?"     Testing 7. ANTIBIOTIC: "What antibiotic(s) are you taking?" "How many times per day?"     Bactrim 8. ANTIBIOTIC - START DATE: "When did you start taking the antibiotic?"     03/28/2023 - 04/04/2023  Protocols used:  Urinary Tract Infection on Antibiotic Follow-up Call - [facility]

## 2023-04-08 ENCOUNTER — Encounter: Payer: Self-pay | Admitting: Family Medicine

## 2023-04-08 ENCOUNTER — Ambulatory Visit (INDEPENDENT_AMBULATORY_CARE_PROVIDER_SITE_OTHER): Payer: Medicare Other | Admitting: Family Medicine

## 2023-04-08 VITALS — BP 118/70 | HR 81 | Temp 97.9°F | Ht 66.0 in | Wt 162.0 lb

## 2023-04-08 DIAGNOSIS — R300 Dysuria: Secondary | ICD-10-CM | POA: Diagnosis not present

## 2023-04-08 LAB — URINALYSIS, ROUTINE W REFLEX MICROSCOPIC
Bilirubin Urine: NEGATIVE
Hgb urine dipstick: NEGATIVE
Hyaline Cast: NONE SEEN /LPF
Nitrite: NEGATIVE
RBC / HPF: NONE SEEN /HPF (ref 0–2)
Specific Gravity, Urine: 1.02 (ref 1.001–1.035)
pH: 6.5 (ref 5.0–8.0)

## 2023-04-08 LAB — MICROSCOPIC MESSAGE

## 2023-04-08 NOTE — Progress Notes (Signed)
Subjective:    Patient ID: April Beck, female    DOB: March 18, 1946, 77 y.o.   MRN: [id number]  Patient is a 77 year old Caucasian female with a history of chronic kidney disease and hypertension who is here today for frequent UTIs.  She states that she has had 3 UTIs within the long.  Each time she has been treated adequately with antibiotics.  She is called with urgent care on all 3 occasions.  There has been wound culture obtained which grew E. coli that was pansensitive.  She was given 7 days of Bactrim which has been sufficient.  She presents today continuing to have some pressure and urgency.  Urinalysis shows trace glucose, trace ketones, +1 protein, and trace leukocyte esterase.  Patient denies dysuria Past Medical History:  Diagnosis Date   Anxiety    hx of panic attack   Arthritis    hands & knees   CKD (chronic kidney disease), stage III (HCC)    followed by pcp   Closed fracture of left distal radius 10/31/2021   Edema of both lower extremities    GERD (gastroesophageal reflux disease)    History of 2019 novel coronavirus disease (COVID-19) 09/17/2020   positive result in epic,  per pt very mild symtpoms that resolved   History of uterine fibroid    Hypertension    followed by pcp   Hypomagnesemia    Hypothyroidism    followed by pcp   IDA (iron deficiency anemia)    Osteopenia    Ovarian cyst    Pelvic pain    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, takes meloxicam   Wears contact lenses    Past Surgical History:  Procedure Laterality Date   ABDOMINAL HYSTERECTOMY  1982   CATARACT EXTRACTION Bilateral    CHOLECYSTECTOMY  06/20/2012   Procedure: LAPAROSCOPIC CHOLECYSTECTOMY;  Surgeon: Liz Malady, MD;  Location: [facility];  Service: General;  Laterality: N/A;   COLONOSCOPY  last one 03-20-2019  dr Christella Hartigan   DIAGNOSTIC LAPAROSCOPY  yrs ago   HIP ARTHROPLASTY Right 11/13/2020   Procedure: ARTHROPLASTY BIPOLAR HIP (HEMIARTHROPLASTY);   Surgeon: Vickki Hearing, MD;  Location: [facility];  Service: Orthopedics;  Laterality: Right;   INCONTINENCE SURGERY  09-11-2005   dr Annabell Howells  @[facility]    LYNX SLING   LAPAROSCOPIC SALPINGO OOPHERECTOMY Bilateral 01/16/2021   Procedure: DIAGNOSTIC LAPAROSCOPY;  Surgeon: Richarda Overlie, MD;  [facility];  Service: Gynecology;  Laterality: Bilateral;   LAPAROSCOPY Bilateral 01/16/2021   Procedure: EXPLORATORY LAPAROTOMY BILATERAL SALPINGO OOPHORECTOMY;  Surgeon: Richarda Overlie, MD;  [facility];  Service: Gynecology;  Laterality: Bilateral;   TUBAL LIGATION     Current Outpatient Medications on File Prior to Visit  Medication Sig Dispense Refill   acetaminophen (TYLENOL) 325 MG tablet Take 2 tablets (650 mg total) by mouth every 6 (six) hours as needed for pain. (Patient taking differently: Take 650 mg by mouth every 6 (six) hours as needed for pain. Arthritis formula per pt)     acyclovir (ZOVIRAX) 400 MG tablet TAKE 1 TABLET EVERY DAY 90 tablet 3   atorvastatin (LIPITOR) 40 MG tablet TAKE 1 TABLET EVERY DAY 90 tablet 3   calcium-vitamin D (OSCAL WITH D) 250-125 MG-UNIT tablet Take 1 tablet by mouth daily.     citalopram (CELEXA) 10 MG tablet TAKE 1 TABLET EVERY DAY 90 tablet 3   fluocinonide gel (LIDEX) 0.05 % Apply 1 application topically  3 (three) times daily as needed (rash).     levothyroxine (SYNTHROID) 50 MCG tablet TAKE 1 TABLET EVERY DAY 90 tablet 2   losartan (COZAAR) 50 MG tablet Take 1 tablet (50 mg total) by mouth daily. 90 tablet 1   pantoprazole (PROTONIX) 40 MG tablet TAKE 1 TABLET TWICE DAILY 180 tablet 2   triamcinolone cream (KENALOG) 0.1 % Apply 1 Application topically 2 (two) times daily. 30 g 0   No current facility-administered medications on file prior to visit.   No Known Allergies Social History   Socioeconomic History   Marital status: Divorced    Spouse name: Not on file   Number of children: Not on file    Years of education: Not on file   Highest education level: 12th grade  Occupational History   Not on file  Tobacco Use   Smoking status: Never   Smokeless tobacco: Never  Vaping Use   Vaping status: Never Used  Substance and Sexual Activity   Alcohol use: No   Drug use: Never   Sexual activity: Not on file  Other Topics Concern   Not on file  Social History Narrative   Not on file   Social Determinants of Health   Financial Resource Strain: Low Risk  (12/14/2022)   Overall Financial Resource Strain (CARDIA)    Difficulty of Paying Living Expenses: Not very hard  Food Insecurity: Unknown (12/14/2022)   Hunger Vital Sign    Worried About Running Out of Food in the Last Year: Never true    Ran Out of Food in the Last Year: Patient declined  Transportation Needs: No Transportation Needs (12/14/2022)   PRAPARE - Administrator, Civil Service (Medical): No    Lack of Transportation (Non-Medical): No  Physical Activity: Unknown (12/14/2022)   Exercise Vital Sign    Days of Exercise per Week: Patient declined    Minutes of Exercise per Session: Not on file  Stress: No Stress Concern Present (12/14/2022)   [employer] of Occupational Health - Occupational Stress Questionnaire    Feeling of Stress : Not at all  Social Connections: Moderately Isolated (12/14/2022)   Social Connection and Isolation Panel [NHANES]    Frequency of Communication with Friends and Family: More than three times a week    Frequency of Social Gatherings with Friends and Family: Twice a week    Attends Religious Services: More than 4 times per year    Active Member of [employer] or Organizations: No    Attends Banker Meetings: Not on file    Marital Status: Divorced  Intimate Partner Violence: Not At Risk (02/23/2021)   Humiliation, Afraid, Rape, and Kick questionnaire    Fear of Current or Ex-Partner: No    Emotionally Abused: No    Physically Abused: No    Sexually Abused: No        Review of Systems  All other systems reviewed and are negative.      Objective:   Physical Exam Vitals reviewed.  Constitutional:      General: She is not in acute distress.    Appearance: She is well-developed. She is not diaphoretic.  HENT:     Head: Normocephalic and atraumatic.     Right Ear: External ear normal.     Left Ear: External ear normal.     Nose: Nose normal.     Mouth/Throat:     Pharynx: No oropharyngeal exudate.  Eyes:     General: No  scleral icterus.       Right eye: No discharge.        Left eye: No discharge.     Conjunctiva/sclera: Conjunctivae normal.     Pupils: Pupils are equal, round, and reactive to light.  Neck:     Thyroid: No thyromegaly.     Vascular: No JVD.     Trachea: No tracheal deviation.  Cardiovascular:     Rate and Rhythm: Normal rate and regular rhythm.     Heart sounds: Normal heart sounds. No murmur heard.    No friction rub. No gallop.  Pulmonary:     Effort: Pulmonary effort is normal. No respiratory distress.     Breath sounds: Normal breath sounds. No stridor. No wheezing or rales.  Chest:     Chest wall: No tenderness.  Abdominal:     General: Bowel sounds are normal. There is no distension.     Palpations: Abdomen is soft. There is no mass.     Tenderness: There is no abdominal tenderness. There is no guarding or rebound.  Musculoskeletal:        General: No tenderness or deformity.     Cervical back: Normal range of motion and neck supple.  Lymphadenopathy:     Cervical: No cervical adenopathy.  Skin:    General: Skin is warm.     Coloration: Skin is not pale.     Findings: No erythema or rash.  Neurological:     Mental Status: She is alert and oriented to person, place, and time.     Cranial Nerves: No cranial nerve deficit.     Motor: No abnormal muscle tone.     Coordination: Coordination normal.     Deep Tendon Reflexes: Reflexes are normal and symmetric. Reflexes normal.  Psychiatric:         Behavior: Behavior normal.        Thought Content: Thought content normal.        Judgment: Judgment normal.           Assessment & Plan:  Dysuria - Plan: Urinalysis, Routine w reflex microscopic, Urine Culture Patient has had 3 urinary tract infections.  After the second, I saw her and had a clear urinalysis.  She then had another urinary tract infection that was proven on a culture to grow E. coli.  Urinalysis today is unremarkable but not clear.  I will get a urine culture.  If the urine culture is positive, I would recommend urology consult as scheduled.  If urine culture is negative, I would recommend vaginal estrogen as a preventative.  If she continues to have urinary tract infections after using vaginal estrogen, I will consult urology.  Await the results of her urinalysis and urine culture

## 2023-04-11 ENCOUNTER — Other Ambulatory Visit: Payer: Self-pay | Admitting: Family Medicine

## 2023-04-11 MED ORDER — ESTRADIOL 0.1 MG/GM VA CREA: 1.0000 | TOPICAL_CREAM | Freq: Every day | VAGINAL | 12 refills | Status: DC

## 2023-04-12 ENCOUNTER — Other Ambulatory Visit: Payer: Self-pay | Admitting: Family Medicine

## 2023-04-12 NOTE — Telephone Encounter (Signed)
Requested Prescriptions  Pending Prescriptions Disp Refills   levothyroxine (SYNTHROID) 50 MCG tablet [Pharmacy Med Name: LEVOTHYROXINE SODIUM 50 MCG Tablet] 90 tablet 2    Sig: TAKE 1 TABLET EVERY DAY     Endocrinology:  Hypothyroid Agents Failed - 04/12/2023  2:20 AM      Failed - Valid encounter within last 12 months    Recent Outpatient Visits           1 year ago Vertigo   [facility] Pickard, Priscille Heidelberg, MD   1 year ago Dysuria   [facility] Donita Brooks, MD   2 years ago Benign essential HTN   [facility] Donita Brooks, MD   2 years ago Closed fracture of right hip with routine healing, subsequent encounter   [facility] Donita Brooks, MD   2 years ago Complex cyst of left ovary   [facility] Pickard, Priscille Heidelberg, MD       Future Appointments             In 3 months Pickard, Priscille Heidelberg, MD [facility]            Passed - TSH in normal range and within 360 days    TSH  Date Value Ref Range Status  03/19/2023 0.57 0.40 - 4.50 mIU/L Final          pantoprazole (PROTONIX) 40 MG tablet [Pharmacy Med Name: PANTOPRAZOLE SODIUM 40 MG Tablet Delayed Release] 180 tablet 2    Sig: TAKE 1 TABLET TWICE DAILY     Gastroenterology: Proton Pump Inhibitors Failed - 04/12/2023  2:20 AM      Failed - Valid encounter within last 12 months    Recent Outpatient Visits           1 year ago Vertigo   [facility] Donita Brooks, MD   1 year ago Dysuria   [facility] Donita Brooks, MD   2 years ago Benign essential HTN   [facility] Donita Brooks, MD   2 years ago Closed fracture of right hip with routine healing, subsequent encounter   [facility] Donita Brooks, MD   2 years ago Complex cyst of left ovary   [facility] Pickard, Priscille Heidelberg, MD       Future  Appointments             In 3 months Pickard, Priscille Heidelberg, MD [facility]

## 2023-04-13 ENCOUNTER — Encounter: Payer: Self-pay | Admitting: Family Medicine

## 2023-04-17 ENCOUNTER — Ambulatory Visit
Admission: RE | Admit: 2023-04-17 | Discharge: 2023-04-17 | Disposition: A | Discharge status: Home / Self Care | Payer: Medicare Other | Source: Ambulatory Visit | Attending: Family Medicine | Admitting: Family Medicine

## 2023-04-17 ENCOUNTER — Encounter: Payer: Self-pay | Admitting: Family Medicine

## 2023-04-17 VITALS — BP 172/81 | HR 75 | Temp 98.0°F | Resp 16

## 2023-04-17 DIAGNOSIS — N390 Urinary tract infection, site not specified: Secondary | ICD-10-CM

## 2023-04-17 LAB — POCT URINALYSIS DIP (MANUAL ENTRY)
Bilirubin, UA: NEGATIVE
Glucose, UA: 100 mg/dL — AB
Ketones, POC UA: NEGATIVE mg/dL
Nitrite, UA: NEGATIVE
Protein Ur, POC: 30 mg/dL — AB
Spec Grav, UA: 1.015 (ref 1.010–1.025)
Urobilinogen, UA: 0.2 E.U./dL
pH, UA: 7 (ref 5.0–8.0)

## 2023-04-17 MED ORDER — CEPHALEXIN 500 MG PO CAPS: 500.0000 mg | ORAL_CAPSULE | Freq: Two times a day (BID) | ORAL | 0 refills | Status: DC

## 2023-04-17 NOTE — ED Triage Notes (Signed)
Pain on urination and pressure that started this morning.

## 2023-04-17 NOTE — ED Provider Notes (Signed)
[facility]    CSN: [id number] Arrival date & time: 04/17/23  1347      History   Chief Complaint Chief Complaint  Patient presents with   Urinary Frequency    Bladder infection - Entered by patient    HPI April Beck is a 77 y.o. female.   Presenting today with 1 day history of dysuria, urinary frequency and suprapubic pressure.  Denies fever, chills, pelvic or abdominal pain, nausea vomiting, hematuria.  History of frequent urinary tract infections.  So far not trying anything over-the-counter for symptoms.    Past Medical History:  Diagnosis Date   Anxiety    hx of panic attack   Arthritis    hands & knees   CKD (chronic kidney disease), stage III (HCC)    followed by pcp   Closed fracture of left distal radius 10/31/2021   Edema of both lower extremities    GERD (gastroesophageal reflux disease)    History of 2019 novel coronavirus disease (COVID-19) 09/17/2020   positive result in epic,  per pt very mild symtpoms that resolved   History of uterine fibroid    Hypertension    followed by pcp   Hypomagnesemia    Hypothyroidism    followed by pcp   IDA (iron deficiency anemia)    Osteopenia    Ovarian cyst    Pelvic pain    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, takes meloxicam   Wears contact lenses     Patient Active Problem List   Diagnosis Date Noted   Arthritis of carpometacarpal [facility]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 01/16/2022   Closed fracture of left distal radius 10/31/2021   Ovarian cyst 01/16/2021   Chronic constipation 11/21/2020   CKD (chronic kidney disease) stage 3, GFR 30-59 ml/min (HCC) 11/21/2020   Herpes 11/21/2020   Hypokalemia 11/21/2020   Acute blood loss anemia 11/21/2020   Weakness 11/17/2020   Aspiration into airway    Fall    Closed fracture of right hip (HCC) 11/12/2020   Dysphagia, unspecified(787.20) 06/14/2013   Heel pain 06/14/2013   Hypercholesteremia    Hypertension    GERD  (gastroesophageal reflux disease)    Hypothyroidism    Renal insufficiency 06/19/2012    Past Surgical History:  Procedure Laterality Date   ABDOMINAL HYSTERECTOMY  1982   CATARACT EXTRACTION Bilateral    CHOLECYSTECTOMY  06/20/2012   Procedure: LAPAROSCOPIC CHOLECYSTECTOMY;  Surgeon: Liz Malady, MD;  Location: [facility];  Service: General;  Laterality: N/A;   COLONOSCOPY  last one 03-20-2019  dr Christella Hartigan   DIAGNOSTIC LAPAROSCOPY  yrs ago   HIP ARTHROPLASTY Right 11/13/2020   Procedure: ARTHROPLASTY BIPOLAR HIP (HEMIARTHROPLASTY);  Surgeon: Vickki Hearing, MD;  Location: [facility];  Service: Orthopedics;  Laterality: Right;   INCONTINENCE SURGERY  09-11-2005   dr Annabell Howells  @[email or web address]    LYNX SLING   LAPAROSCOPIC SALPINGO OOPHERECTOMY Bilateral 01/16/2021   Procedure: DIAGNOSTIC LAPAROSCOPY;  Surgeon: Richarda Overlie, MD;  Location[facility];  Service: Gynecology;  Laterality: Bilateral;   LAPAROSCOPY Bilateral 01/16/2021   Procedure: EXPLORATORY LAPAROTOMY BILATERAL SALPINGO OOPHORECTOMY;  Surgeon: Richarda Overlie, MD;  Location: [facility];  Service: Gynecology;  Laterality: Bilateral;   TUBAL LIGATION      OB History     Gravida  2   Para  2   Term      Preterm      AB  Living  2      SAB      IAB      Ectopic      Multiple      Live Births               Home Medications    Prior to Admission medications   Medication Sig Start Date End Date Taking? Authorizing Provider  cephALEXin (KEFLEX) 500 MG capsule Take 1 capsule (500 mg total) by mouth 2 (two) times daily. 04/17/23  Yes Particia Nearing, PA-C  acetaminophen (TYLENOL) 325 MG tablet Take 2 tablets (650 mg total) by mouth every 6 (six) hours as needed for pain. Patient taking differently: Take 650 mg by mouth every 6 (six) hours as needed for pain. Arthritis formula per pt 06/21/12   Sherrie George, PA-C  atorvastatin (LIPITOR) 40 MG tablet TAKE 1 TABLET  EVERY DAY 06/27/22   Donita Brooks, MD  calcium-vitamin D (OSCAL WITH D) 250-125 MG-UNIT tablet Take 1 tablet by mouth daily.    [provider]  citalopram (CELEXA) 10 MG tablet TAKE 1 TABLET EVERY DAY 06/27/22   Donita Brooks, MD  estradiol (ESTRACE VAGINAL) 0.1 MG/GM vaginal cream Place 1 Applicatorful vaginally at bedtime. 04/11/23   Donita Brooks, MD  fluocinonide gel (LIDEX) 0.05 % Apply 1 application topically 3 (three) times daily as needed (rash). 09/08/20   [provider]  levothyroxine (SYNTHROID) 50 MCG tablet TAKE 1 TABLET EVERY DAY 04/12/23   Donita Brooks, MD  losartan (COZAAR) 50 MG tablet Take 1 tablet (50 mg total) by mouth daily. 01/30/23   Donita Brooks, MD  pantoprazole (PROTONIX) 40 MG tablet TAKE 1 TABLET TWICE DAILY 04/12/23   Donita Brooks, MD    Family History Family History  Problem Relation Age of Onset   Colon polyps Brother    Breast cancer Maternal Grandmother    Diabetes Brother    Hypertension Brother    Colon cancer Neg Hx    Esophageal cancer Neg Hx    Rectal cancer Neg Hx    Stomach cancer Neg Hx     Social History Social History   Tobacco Use   Smoking status: Never   Smokeless tobacco: Never  Vaping Use   Vaping status: Never Used  Substance Use Topics   Alcohol use: No   Drug use: Never     Allergies   Patient has no known allergies.   Review of Systems Review of Systems Per HPI  Physical Exam Triage Vital Signs ED Triage Vitals  Encounter Vitals Group     BP 04/17/23 1351 (!) 172/81     Systolic BP Percentile --      Diastolic BP Percentile --      Pulse Rate 04/17/23 1351 75     Resp 04/17/23 1351 16     Temp 04/17/23 1351 98 F (36.7 C)     Temp Source 04/17/23 1351 Oral     SpO2 04/17/23 1351 93 %     Weight --      Height --      Head Circumference --      Peak Flow --      Pain Score 04/17/23 1353 8     Pain Loc --      Pain Education --      Exclude from Growth Chart --     No data found.  Updated Vital Signs BP (!) 172/81 (BP Location: Right Arm)  Pulse 75   Temp 98 F (36.7 C) (Oral)   Resp 16   SpO2 93%   Visual Acuity Right Eye Distance:   Left Eye Distance:   Bilateral Distance:    Right Eye Near:   Left Eye Near:    Bilateral Near:     Physical Exam Vitals and nursing note reviewed.  Constitutional:      Appearance: Normal appearance. She is not ill-appearing.  HENT:     Head: Atraumatic.     Mouth/Throat:     Mouth: Mucous membranes are moist.  Eyes:     Extraocular Movements: Extraocular movements intact.     Conjunctiva/sclera: Conjunctivae normal.  Cardiovascular:     Rate and Rhythm: Normal rate and regular rhythm.     Heart sounds: Normal heart sounds.  Pulmonary:     Effort: Pulmonary effort is normal.     Breath sounds: Normal breath sounds.  Abdominal:     General: Bowel sounds are normal. There is no distension.     Palpations: Abdomen is soft.     Tenderness: There is no abdominal tenderness. There is no right CVA tenderness, left CVA tenderness or guarding.  Musculoskeletal:        General: Normal range of motion.     Cervical back: Normal range of motion and neck supple.  Skin:    General: Skin is warm and dry.  Neurological:     Mental Status: She is alert and oriented to person, place, and time.  Psychiatric:        Mood and Affect: Mood normal.        Thought Content: Thought content normal.        Judgment: Judgment normal.      UC Treatments / Results  Labs (all labs ordered are listed, but only abnormal results are displayed) Labs Reviewed  POCT URINALYSIS DIP (MANUAL ENTRY) - Abnormal; Notable for the following components:      Result Value   Clarity, UA cloudy (*)    Glucose, UA =100 (*)    Blood, UA small (*)    Protein Ur, POC =30 (*)    Leukocytes, UA Small (1+) (*)    All other components within normal limits  URINE CULTURE    EKG   Radiology No results  found.  Procedures Procedures (including critical care time)  Medications Ordered in UC Medications - No data to display  Initial Impression / Assessment and Plan / UC Course  I have reviewed the triage vital signs and the nursing notes.  Pertinent labs & imaging results that were available during my care of the patient were reviewed by me and considered in my medical decision making (see chart for details).     Urinalysis today with evidence of urinary tract infection, treat with Keflex and await urine culture, adjusting if needed.  Return for worsening symptoms. Final Clinical Impressions(s) / UC Diagnoses   Final diagnoses:  Acute lower UTI   Discharge Instructions   None    ED Prescriptions     Medication Sig Dispense Auth. Provider   cephALEXin (KEFLEX) 500 MG capsule Take 1 capsule (500 mg total) by mouth 2 (two) times daily. 10 capsule Particia Nearing, [place]      PDMP not reviewed this encounter.   Particia Nearing, [place] 04/17/23 1417

## 2023-04-18 ENCOUNTER — Other Ambulatory Visit: Payer: Self-pay | Admitting: Family Medicine

## 2023-04-18 DIAGNOSIS — N390 Urinary tract infection, site not specified: Secondary | ICD-10-CM

## 2023-04-19 ENCOUNTER — Ambulatory Visit: Payer: Medicare Other | Admitting: Family Medicine

## 2023-04-19 LAB — URINE CULTURE

## 2023-04-25 ENCOUNTER — Encounter: Payer: Self-pay | Admitting: Urology

## 2023-04-25 ENCOUNTER — Ambulatory Visit (INDEPENDENT_AMBULATORY_CARE_PROVIDER_SITE_OTHER): Payer: Medicare Other | Admitting: Urology

## 2023-04-25 VITALS — BP 147/81 | HR 73 | Temp 97.6°F

## 2023-04-25 DIAGNOSIS — Z87440 Personal history of urinary (tract) infections: Secondary | ICD-10-CM

## 2023-04-25 DIAGNOSIS — Z960 Presence of urogenital implants: Secondary | ICD-10-CM

## 2023-04-25 DIAGNOSIS — N952 Postmenopausal atrophic vaginitis: Secondary | ICD-10-CM | POA: Diagnosis not present

## 2023-04-25 DIAGNOSIS — K5900 Constipation, unspecified: Secondary | ICD-10-CM

## 2023-04-25 LAB — URINALYSIS, ROUTINE W REFLEX MICROSCOPIC
Bilirubin, UA: NEGATIVE
Glucose, UA: NEGATIVE
Ketones, UA: NEGATIVE
Leukocytes,UA: NEGATIVE
Nitrite, UA: NEGATIVE
Protein,UA: NEGATIVE
RBC, UA: NEGATIVE
Specific Gravity, UA: 1.01 (ref 1.005–1.030)
Urobilinogen, Ur: 0.2 mg/dL (ref 0.2–1.0)
pH, UA: 7 (ref 5.0–7.5)

## 2023-04-25 LAB — BLADDER SCAN AMB NON-IMAGING: Scan Result: 0

## 2023-04-25 NOTE — Patient Instructions (Addendum)
Recommendations regarding UTI prevention / management:  When UTI symptoms occur: Call urology office to request order for urine culture. We recommend waiting for urine culture result prior to use of any antibiotics.  For bladder pain/ burning with urination: Over the counter Pyridium (phenazopyridine) as needed (commonly known under the "AZO" brand). No more than 3 days consecutively at a time due to risk for methemoglobinemia, liver function issues, and bone health damage with long term use of Pyridium.  Routine use for UTI prevention: - Topical vaginal estrogen for vaginal atrophy. Adequate fluid intake (>1.5 liters/day) to flush out the urinary tract. - Go to the bathroom to urinate every 4-6 hours while awake to minimize urinary stasis / bacterial overgrowth in the bladder. - Proanthocyanidin (PAC) supplement 36 mg daily; must be soluble (insoluble form of PAC will be ineffective). Recommended brand: Ellura. This is an over-the-counter supplement (often must be found/ purchased online) supplement derived from cranberries with concentrated active component: Proanthocyanidin (PAC) 36 mg daily. Decreases bacterial adherence to bladder lining. Not recommended for patients with interstitial cystitis due to acidity. - D-mannose powder (2 grams daily). This is an over-the-counter supplement which decreases bacterial adherence to bladder lining (it is a sugar that inhibits bacterial adherence to urothelial cells by binding to the pili of enteric bacteria). Take as per manufacturer recommendation. Can be used as an alternative or in addition to the concentrated cranberry supplement. Not recommended for diabetic patients due to sugar content. - Vitamin C supplement to acidify urine to minimize bacterial growth. Not recommended for patients with interstitial cystitis due to acidity. - Probiotic to maintain healthy vaginal microbiome to suppress bacteria at urethral opening. Brand recommendations: Darrold Junker  (includes probiotic & D-mannose ), Feminine Balance (highest concentration of lactobacillus) or Hyperbiotic Pro 15.     Comparison of Fiber Supplements The best way to consume 35 grams of fiber per day is through a healthy diet, but that may not always be possible. Fiber supplements are available without a prescription to help increase fiber consumption.  Comparison of powder fiber brands:   Metamucil Citrucel Benefiber Fibersure   Active ingredient Psyllium husk Methylcellulos e Wheat dextrin lnulin   Is active* natural? Natural Semi-synthetic Natural Natural   FDA approval for laxation? Yes Yes No No   Grams of active/day for laxation 2.5-30 grams 4-6 grams Not approved as laxative Not approved as laxative   Amount of active/dose 3.4 grams 2 grams 3 grams 5 grams   Required # of doses/day for laxation 1 2 Not approved as laxative Not approved as laxative   Soluble fiber/ Insoluble fiber? 70% soluble 100% soluble 100% soluble 100% soluble   Active holds water? Yes Yes No No    Active forms a gel? Yes No No No  Active bulks stools? Yes Yes No No  tive traps bile acids? Yes No No Inadequate data  Active is fermentable? Partially No Yes Yes  Helps lower blood cholesterol? Yes Minimally No No  Helps lower blood pressure? Yes Inadequate data Inadequate data Inadequate data  Helps lower blood sugar? Yes Inadequate Yes No data  Helps lower the risk of heart disease? Yes Inadequate data Inadequate data Inadequate data    Comparison of solid dose fiber brands:   Metamucil Citrucel FiberCon FiberChoice    Capsules Swallowable Swallowable      Caplets tablets    Active Psyllium husk Methylcellulose Calcium lnulin   ingredient   Polycarbophil    Is active* Natural Semi-synthetic Synthetic Natural   natural?  FDA approval Yes Yes Yes No   for laxation?       Grams of 2.5-30 grams 4-6 grams 4-6 grams 5 grams   active/day for       laxation       Amount of 0.525 grams  0.5 0.625 2 grams/tablet   active/dose /capsule= 5 grams/caplet = grams/caplet = 2 tablets/dose    capsules/dose 2 caplets/dose =2       caplets/dose    Required # of 1 4 4  n/a   doses/day for       laxation       Required # of 5 8 8  n/a   caplets/day for       laxation       Soluble fiber/ 70% soluble 100% soluble 100% soluble 100% soluble   Insoluble fiber?        Active holds water? Yes Yes Yes No  Active forms a gel? Yes No Yes No  Active bulks stools? Yes Yes Yes No  Active traps bile acids? Yes Yes Inadequate data Inadequate data  Active is fermentable? Partially No No Yes  Helps lower blood cholesterol? Yes Minimally No No    *Active refers to the active ingredient in the supplement. Resource: www.nationalfibercouncil.org

## 2023-04-25 NOTE — Progress Notes (Signed)
Name: April Beck DOB: 27-Oct-1945 MRN: [id number]  History of Present Illness: April Beck is a 77 y.o. female who presents today as a new patient at [facility] [place]. All available relevant medical records have been reviewed.   She reports chief complaint of possible recurrent UTls.  Urine culture results in past 12 months: - 03/19/2023: Negative urinalysis - 03/28/2023: Positive for E. coli - 04/08/2023: Negative - 04/17/2023: Negative  She was prescribed topical vaginal estrogen cream for use 3x/week by her PCP on 04/11/2023.  Urinary Symptoms: She reports 4 suspected UTl's in the last 2 months. When present, UTI symptoms include dysuria, bladder "fullness / tightness" increased urinary urgency, frequency. She reports that UTI symptoms do not seem to correlate with intercourse (not sexually active). She denies acute UTI symptoms today.  At baseline: She denies urinary urgency, frequency, dysuria, gross hematuria, hesitancy, straining to void, or sensations of incomplete emptying. She denies pushing on a bulge in order to empty her bladder.  Reports prior mid-urethral sling for stress urinary incontinence (resolved). She denies urge incontinence.  She denies history of pyelonephritis.  She denies history of kidney stones.  Vaginal / prolapse Symptoms: She denies vaginal bulge sensation.  She denies seeing a vaginal bulge.  She denies vaginal pain, bleeding, or discharge.   Bowel Symptoms: She reports some constipation; Typical Bristol stool scale of continent bowel episodes is Type 1-2.  She denies diarrhea, rectal bleeding, pain with defecation, straining to defecate, or fecal incontinence.  She denies taking laxatives, stool softeners, or fiber supplements.    Past OB/GYN History: OB History     Gravida  2   Para  2   Term      Preterm      AB      Living  2      SAB      IAB      Ectopic      Multiple      Live Births              She denies being sexually active.  She is post menopausal.  She denies history of total abdominal hysterectomy (1982) and BSO (2023).  Fall Screening: Do you usually have a device to assist in your mobility? No   Medications: Current Outpatient Medications  Medication Sig Dispense Refill   acetaminophen (TYLENOL) 325 MG tablet Take 2 tablets (650 mg total) by mouth every 6 (six) hours as needed for pain. (Patient taking differently: Take 650 mg by mouth every 6 (six) hours as needed for pain. Arthritis formula per pt)     atorvastatin (LIPITOR) 40 MG tablet TAKE 1 TABLET EVERY DAY 90 tablet 3   calcium-vitamin D (OSCAL WITH D) 250-125 MG-UNIT tablet Take 1 tablet by mouth daily.     cephALEXin (KEFLEX) 500 MG capsule Take 1 capsule (500 mg total) by mouth 2 (two) times daily. 10 capsule 0   citalopram (CELEXA) 10 MG tablet TAKE 1 TABLET EVERY DAY 90 tablet 3   estradiol (ESTRACE VAGINAL) 0.1 MG/GM vaginal cream Place 1 Applicatorful vaginally at bedtime. 42 each 12   fluocinonide gel (LIDEX) 0.05 % Apply 1 application topically 3 (three) times daily as needed (rash).     levothyroxine (SYNTHROID) 50 MCG tablet TAKE 1 TABLET EVERY DAY 90 tablet 2   losartan (COZAAR) 50 MG tablet Take 1 tablet (50 mg total) by mouth daily. 90 tablet 1   pantoprazole (PROTONIX) 40 MG tablet TAKE 1 TABLET TWICE  DAILY 180 tablet 2   No current facility-administered medications for this visit.    Allergies: No Known Allergies  Past Medical History:  Diagnosis Date   Anxiety    hx of panic attack   Arthritis    hands & knees   CKD (chronic kidney disease), stage III (HCC)    followed by pcp   Closed fracture of left distal radius 10/31/2021   Edema of both lower extremities    GERD (gastroesophageal reflux disease)    History of 2019 novel coronavirus disease (COVID-19) 09/17/2020   positive result in epic,  per pt very mild symtpoms that resolved   History of uterine fibroid     Hypertension    followed by pcp   Hypomagnesemia    Hypothyroidism    followed by pcp   IDA (iron deficiency anemia)    Osteopenia    Ovarian cyst    Pelvic pain    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, takes meloxicam   Wears contact lenses    Past Surgical History:  Procedure Laterality Date   ABDOMINAL HYSTERECTOMY  1982   CATARACT EXTRACTION Bilateral    CHOLECYSTECTOMY  06/20/2012   Procedure: LAPAROSCOPIC CHOLECYSTECTOMY;  Surgeon: Liz Malady, MD;  Location: [facility];  Service: General;  Laterality: N/A;   COLONOSCOPY  last one 03-20-2019  dr Christella Hartigan   DIAGNOSTIC LAPAROSCOPY  yrs ago   HIP ARTHROPLASTY Right 11/13/2020   Procedure: ARTHROPLASTY BIPOLAR HIP (HEMIARTHROPLASTY);  Surgeon: Vickki Hearing, MD;  Location: [facility];  Service: Orthopedics;  Laterality: Right;   INCONTINENCE SURGERY  09-11-2005   dr Annabell Howells  @[email or web address]    LYNX SLING   LAPAROSCOPIC SALPINGO OOPHERECTOMY Bilateral 01/16/2021   Procedure: DIAGNOSTIC LAPAROSCOPY;  Surgeon: Richarda Overlie, MD;  Location: [facility];  Service: Gynecology;  Laterality: Bilateral;   LAPAROSCOPY Bilateral 01/16/2021   Procedure: EXPLORATORY LAPAROTOMY BILATERAL SALPINGO OOPHORECTOMY;  Surgeon: Richarda Overlie, MD;  Location: [facility];  Service: Gynecology;  Laterality: Bilateral;   TUBAL LIGATION     Family History  Problem Relation Age of Onset   Colon polyps Brother    Breast cancer Maternal Grandmother    Diabetes Brother    Hypertension Brother    Colon cancer Neg Hx    Esophageal cancer Neg Hx    Rectal cancer Neg Hx    Stomach cancer Neg Hx    Social History   Socioeconomic History   Marital status: Divorced    Spouse name: Not on file   Number of children: Not on file   Years of education: Not on file   Highest education level: 12th grade  Occupational History   Not on file  Tobacco Use   Smoking status: Never   Smokeless tobacco: Never  Vaping  Use   Vaping status: Never Used  Substance and Sexual Activity   Alcohol use: No   Drug use: Never   Sexual activity: Not Currently    Birth control/protection: Surgical  Other Topics Concern   Not on file  Social History Narrative   Not on file   Social Determinants of Health   Financial Resource Strain: Low Risk  (12/14/2022)   Overall Financial Resource Strain (CARDIA)    Difficulty of Paying Living Expenses: Not very hard  Food Insecurity: Unknown (12/14/2022)   Hunger Vital Sign    Worried About Running Out of Food in the Last Year: Never true    Ran Out of  Food in the Last Year: Patient declined  Transportation Needs: No Transportation Needs (12/14/2022)   PRAPARE - Administrator, Civil Service (Medical): No    Lack of Transportation (Non-Medical): No  Physical Activity: Unknown (12/14/2022)   Exercise Vital Sign    Days of Exercise per Week: Patient declined    Minutes of Exercise per Session: Not on file  Stress: No Stress Concern Present (12/14/2022)   [employer] of Occupational Health - Occupational Stress Questionnaire    Feeling of Stress : Not at all  Social Connections: Moderately Isolated (12/14/2022)   Social Connection and Isolation Panel [NHANES]    Frequency of Communication with Friends and Family: More than three times a week    Frequency of Social Gatherings with Friends and Family: Twice a week    Attends Religious Services: More than 4 times per year    Active Member of [employer] or Organizations: No    Attends Engineer, structural: Not on file    Marital Status: Divorced  Intimate Partner Violence: Not At Risk (02/23/2021)   Humiliation, Afraid, Rape, and Kick questionnaire    Fear of Current or Ex-Partner: No    Emotionally Abused: No    Physically Abused: No    Sexually Abused: No    SUBJECTIVE  Review of Systems Constitutional: Patient denies any unintentional weight loss or change in strength lntegumentary: Patient  denies any rashes or pruritus Cardiovascular: Patient denies chest pain or syncope Respiratory: Patient denies shortness of breath Gastrointestinal: Patient denies nausea, vomiting, or diarrhea. Reports constipation. Musculoskeletal: Patient denies muscle cramps or weakness Neurologic: Patient denies convulsions or seizures Psychiatric: Patient denies memory problems Allergic/Immunologic: Patient denies recent allergic reaction(s) Hematologic/Lymphatic: Patient denies bleeding tendencies Endocrine: Patient denies heat/cold intolerance  GU: As per HPI.  OBJECTIVE Vitals:   04/25/23 0904  BP: (!) 147/81  Pulse: 73  Temp: 97.6 F (36.4 C)   There is no height or weight on file to calculate BMI.  Physical Examination Constitutional: No obvious distress; patient is non-toxic appearing  Cardiovascular: No visible lower extremity edema.  Respiratory: The patient does not have audible wheezing/stridor; respirations do not appear labored  Gastrointestinal: Abdomen non-distended Musculoskeletal: Normal ROM of UEs  Skin: No obvious rashes/open sores  Neurologic: CN 2-12 grossly intact Psychiatric: Answered questions appropriately with normal affect  Hematologic/Lymphatic/Immunologic: No obvious bruises or sites of spontaneous bleeding  UA: negative  PVR: 0 ml  ASSESSMENT History of UTI - Plan: Urinalysis, Routine w reflex microscopic, BLADDER SCAN AMB NON-IMAGING  Atrophic vaginitis  Constipation, unspecified constipation type  Suburethral sling present  History of UTls with insufficient urine culture data to formally validate recurrent UTI diagnosis:  We reviewed the diagnostic criteria for recurrent UTI and that we do not currently have enough urine culture data to support or refute the formal diagnosis of recurrent UTI.   We considered other possible etiologies for UTI symptoms such as symptomatic vaginal atrophy, post infectious bladder inflammation, mid-urethral sling  erosion, cystitis cystica, eosinophilic cystitis, interstitial cystitis, stone passage, etc.  We reviewed UTI symptoms such as fever, confusion, malaise, increased fatigue, dysuria, acutely increased urinary urgency and/or frequency. We discussed that urine color and odor are not clinical indicators of UTI. Patient was advised that if/when UTI-like symptoms occur they can call our office to speak with a nurse, who can place an order for urinalysis (with reflex to urine culture). They may then proceed to a [facility] to provide their urine sample. This  is required for evaluation in order for their Urology provider to make informed treatment recommendation(s), which may or may not include an antibiotic prescription.  For prevention of symptomatic UTls, we discussed options including: Adequate fluid intake (>1.5 liters/day) to flush out the urinary tract. Go to the bathroom to urinate every 4-6 hours while awake to minimize urinary stasis / bacterial overgrowth in the bladder. Proanthocyanidin (PAC) supplement (36 mg daily) D-mannose 2 g daily Vitamin C supplement Probiotic to maintain healthy bladder microbiome - Topical vaginal estrogen for vaginal atrophy. The etiology and consequences of urogenital epithelial atrophy was explained to patient. The thinning of the epithelium of the urethra can contribute to urinary urgency and frequency syndromes. In addition, the normal bacterial flora that colonizes the perineum may contribute to UTI risk because the thin urethral epithelium allows the bacteria to become adherent and the change in vaginal pH can disrupt the vaginal / urethral microbiome and allow for bacterial overgrowth. Patient was advised that topical vaginal estrogen replacement will take about 3 months to restore the vaginal pH and may sting/burn initially due to severe dryness, which will improve with ongoing treatment. OK to have sex with any of the topical vaginal estrogen replacement  options. We discussed that there have been studies that evaluate use of low-dose intravaginal estrogen cream that shows minimal systemic absorption that is negligible after 3 weeks. There have been no studies indicating increased risk of contributing to breast cancer development or recurrence.  For management of acute UTI symptoms: Discussed option to take over-the-counter Pyridium (commonly known under the AZO brand name) for bladder pain. No more than 3 days at a time.  2. Vaginal atrophy. As above.  3. Constipation.  - Advised increasing fluid intake, night-time fiber supplementation with Citrucel, and eating a high fiber breakfast. Handout provided about fiber supplementation options. - Advised use of stool softeners and/or laxatives on a PRN basis.   Will plan for follow up in 3 months or sooner if needed. Pt verbalized understanding and agreement. All questions were answered.  PLAN Advised the following: 1. Continue topical vaginal estrogen cream 3 nights per week. 2. Adequate fluid intake (>1.5 liters/day) to flush out the urinary tract. 3. Urinate every 4-6 hours while awake to minimize urinary stasis/ bacterial overgrowth in the bladder. 4. Consider OTC supplements for UTI prevention. 5. Return in about 3 months (around 07/26/2023) for UA, PVR, & f/u with Evette Georges NP.  Orders Placed This Encounter  Procedures   Urinalysis, Routine w reflex microscopic   BLADDER SCAN AMB NON-IMAGING    It has been explained that the patient is to follow regularly with their PCP in addition to all other providers involved in their care and to follow instructions provided by these respective offices. Patient advised to contact urology clinic if any urologic-pertaining questions, concerns, new symptoms or problems arise in the interim period.  Patient Instructions  Recommendations regarding UTI prevention / management:  When UTI symptoms occur: Call urology office to request order for  urine culture. We recommend waiting for urine culture result prior to use of any antibiotics.  For bladder pain/ burning with urination: Over the counter Pyridium (phenazopyridine) as needed (commonly known under the "AZO" brand). No more than 3 days consecutively at a time due to risk for methemoglobinemia, liver function issues, and bone health damage with long term use of Pyridium.  Routine use for UTI prevention: - Topical vaginal estrogen for vaginal atrophy. Adequate fluid intake (>1.5 liters/day) to flush out the  urinary tract. - Go to the bathroom to urinate every 4-6 hours while awake to minimize urinary stasis / bacterial overgrowth in the bladder. - Proanthocyanidin (PAC) supplement 36 mg daily; must be soluble (insoluble form of PAC will be ineffective). Recommended brand: Ellura. This is an over-the-counter supplement (often must be found/ purchased online) supplement derived from cranberries with concentrated active component: Proanthocyanidin (PAC) 36 mg daily. Decreases bacterial adherence to bladder lining. Not recommended for patients with interstitial cystitis due to acidity. - D-mannose powder (2 grams daily). This is an over-the-counter supplement which decreases bacterial adherence to bladder lining (it is a sugar that inhibits bacterial adherence to urothelial cells by binding to the pili of enteric bacteria). Take as per manufacturer recommendation. Can be used as an alternative or in addition to the concentrated cranberry supplement. Not recommended for diabetic patients due to sugar content. - Vitamin C supplement to acidify urine to minimize bacterial growth. Not recommended for patients with interstitial cystitis due to acidity. - Probiotic to maintain healthy vaginal microbiome to suppress bacteria at urethral opening. Brand recommendations: Darrold Junker (includes probiotic & D-mannose ), Feminine Balance (highest concentration of lactobacillus) or Hyperbiotic Pro  15.     Comparison of Fiber Supplements The best way to consume 35 grams of fiber per day is through a healthy diet, but that may not always be possible. Fiber supplements are available without a prescription to help increase fiber consumption.  Comparison of powder fiber brands:   Metamucil Citrucel Benefiber Fibersure   Active ingredient Psyllium husk Methylcellulos e Wheat dextrin lnulin   Is active* natural? Natural Semi-synthetic Natural Natural   FDA approval for laxation? Yes Yes No No   Grams of active/day for laxation 2.5-30 grams 4-6 grams Not approved as laxative Not approved as laxative   Amount of active/dose 3.4 grams 2 grams 3 grams 5 grams   Required # of doses/day for laxation 1 2 Not approved as laxative Not approved as laxative   Soluble fiber/ Insoluble fiber? 70% soluble 100% soluble 100% soluble 100% soluble   Active holds water? Yes Yes No No    Active forms a gel? Yes No No No  Active bulks stools? Yes Yes No No  tive traps bile acids? Yes No No Inadequate data  Active is fermentable? Partially No Yes Yes  Helps lower blood cholesterol? Yes Minimally No No  Helps lower blood pressure? Yes Inadequate data Inadequate data Inadequate data  Helps lower blood sugar? Yes Inadequate Yes No data  Helps lower the risk of heart disease? Yes Inadequate data Inadequate data Inadequate data    Comparison of solid dose fiber brands:   Metamucil Citrucel FiberCon FiberChoice    Capsules Swallowable Swallowable      Caplets tablets    Active Psyllium husk Methylcellulose Calcium lnulin   ingredient   Polycarbophil    Is active* Natural Semi-synthetic Synthetic Natural   natural?       FDA approval Yes Yes Yes No   for laxation?       Grams of 2.5-30 grams 4-6 grams 4-6 grams 5 grams   active/day for       laxation       Amount of 0.525 grams 0.5 0.625 2 grams/tablet   active/dose /capsule= 5 grams/caplet = grams/caplet = 2 tablets/dose     capsules/dose 2 caplets/dose =2       caplets/dose    Required # of 1 4 4  n/a   doses/day for  laxation       Required # of 5 8 8  n/a   caplets/day for       laxation       Soluble fiber/ 70% soluble 100% soluble 100% soluble 100% soluble   Insoluble fiber?        Active holds water? Yes Yes Yes No  Active forms a gel? Yes No Yes No  Active bulks stools? Yes Yes Yes No  Active traps bile acids? Yes Yes Inadequate data Inadequate data  Active is fermentable? Partially No No Yes  Helps lower blood cholesterol? Yes Minimally No No    *Active refers to the active ingredient in the supplement. Resource: www.nationalfibercouncil.org   Electronically signed by:  Donnita Falls, MSN, FNP-C, CUNP 04/25/2023 9:48 AM

## 2023-04-29 ENCOUNTER — Ambulatory Visit (INDEPENDENT_AMBULATORY_CARE_PROVIDER_SITE_OTHER): Payer: Medicare Other

## 2023-04-29 DIAGNOSIS — E119 Type 2 diabetes mellitus without complications: Secondary | ICD-10-CM

## 2023-04-29 DIAGNOSIS — I10 Essential (primary) hypertension: Secondary | ICD-10-CM

## 2023-05-02 DIAGNOSIS — D225 Melanocytic nevi of trunk: Secondary | ICD-10-CM | POA: Diagnosis not present

## 2023-05-02 DIAGNOSIS — Z1283 Encounter for screening for malignant neoplasm of skin: Secondary | ICD-10-CM | POA: Diagnosis not present

## 2023-05-02 DIAGNOSIS — B078 Other viral warts: Secondary | ICD-10-CM | POA: Diagnosis not present

## 2023-05-18 ENCOUNTER — Other Ambulatory Visit (INDEPENDENT_AMBULATORY_CARE_PROVIDER_SITE_OTHER): Payer: Self-pay | Admitting: Family

## 2023-05-18 DIAGNOSIS — I10 Essential (primary) hypertension: Secondary | ICD-10-CM

## 2023-05-22 ENCOUNTER — Ambulatory Visit (INDEPENDENT_AMBULATORY_CARE_PROVIDER_SITE_OTHER): Payer: Medicare Other

## 2023-05-22 DIAGNOSIS — E119 Type 2 diabetes mellitus without complications: Secondary | ICD-10-CM

## 2023-05-22 DIAGNOSIS — I10 Essential (primary) hypertension: Secondary | ICD-10-CM

## 2023-05-24 ENCOUNTER — Other Ambulatory Visit (INDEPENDENT_AMBULATORY_CARE_PROVIDER_SITE_OTHER): Payer: Self-pay | Admitting: Family

## 2023-05-24 DIAGNOSIS — K219 Gastro-esophageal reflux disease without esophagitis: Secondary | ICD-10-CM

## 2023-05-28 ENCOUNTER — Ambulatory Visit (INDEPENDENT_AMBULATORY_CARE_PROVIDER_SITE_OTHER): Payer: Medicare Other | Admitting: Family

## 2023-05-28 ENCOUNTER — Encounter (INDEPENDENT_AMBULATORY_CARE_PROVIDER_SITE_OTHER): Payer: Self-pay | Admitting: Family

## 2023-05-28 VITALS — BP 122/66 | HR 80 | Temp 98.0°F | Ht 60.24 in | Wt 158.8 lb

## 2023-05-28 DIAGNOSIS — Z1231 Encounter for screening mammogram for malignant neoplasm of breast: Secondary | ICD-10-CM

## 2023-05-28 DIAGNOSIS — Z889 Allergy status to unspecified drugs, medicaments and biological substances status: Secondary | ICD-10-CM

## 2023-05-28 DIAGNOSIS — M85859 Other specified disorders of bone density and structure, unspecified thigh: Secondary | ICD-10-CM

## 2023-05-28 DIAGNOSIS — Z780 Asymptomatic menopausal state: Secondary | ICD-10-CM

## 2023-05-28 DIAGNOSIS — Z0000 Encounter for general adult medical examination without abnormal findings: Secondary | ICD-10-CM

## 2023-05-28 DIAGNOSIS — Z23 Encounter for immunization: Secondary | ICD-10-CM

## 2023-05-28 DIAGNOSIS — E119 Type 2 diabetes mellitus without complications: Secondary | ICD-10-CM

## 2023-05-28 DIAGNOSIS — K219 Gastro-esophageal reflux disease without esophagitis: Secondary | ICD-10-CM

## 2023-05-28 MED ORDER — EPIPEN 2-PAK 0.3 MG/0.3ML IJ SOAJ: 0.3000 mg | Freq: Once | INTRAMUSCULAR | 3 refills | Status: AC | PRN

## 2023-05-28 MED ORDER — METFORMIN HCL ER 500 MG PO TB24
1000.0000 mg | ORAL_TABLET | Freq: Two times a day (BID) | ORAL | 0 refills | Status: DC
Start: 2023-05-28 — End: 2023-06-07

## 2023-05-28 NOTE — Progress Notes (Signed)
Have you seen any specialists/other providers since your last visit with [place]?    Yes  Allergist   Ophthalmologist     The patient was informed that the following HM items are still outstanding:   Health Maintenance Due   Topic Date Due    Advance Directive on File  Never done    Shingrix Vaccine 50+ (1) Never done    INFLUENZA VACCINE  04/04/2023    COVID-19 Vaccine (4 - 2023-24 season) 05/05/2023

## 2023-05-28 NOTE — Progress Notes (Signed)
[facility] - MONQUE ZAHER is a 77 y.o. female who presents today for the following Medicare Wellness Visit:  []  Initial Preventive Physical Exam (IPPE) - "Welcome to Medicare" preventive visit (Vision Screening required)   []  Annual Wellness Visit - Initial  [x]  Annual Wellness Visit - Subsequent      Pt does have concern about bloating/ indigestion and gas. States she takes pantoprazole each morning as directed. She had EGD and colonoscopy in the Spring which was unremarkable. Denies constipation. Reports intermittent diarrhea. No blood in stool.            Health Risk Assessment:   During the past month, how would you rate your general health?:  Good  Which of the following tasks can you do without assistance - drive or take the bus alone; shop for groceries or clothes; prepare your own meals; do your own housework/laundry; handle your own finances/pay bills; eat, bathe or get around your home?: Shop for groceries or clothes, Prepare your own meals, Do your own housework/laundry, Handle your own finances/pay bills, Eat, bathe, dress or get around your home  Which of the following problems have you been bothered by in the past month - dizzy when standing up; problems using the phone; feeling tired or fatigued; moderate or severe body pain?: Moderate or severe body pain (severe)  Do you exercise for about 20 minutes 3 or more days per week?:Yes  During the past month was someone available to help if you needed and wanted help?  For example, if you felt nervous, lonely, got sick and had to stay in bed, needed someone to talk to, needed help with daily chores or needed help just taking care of yourself.: Yes  Do you always wear a seat belt?: Yes  Do you have any trouble taking medications the way you have been told to take them?: No  Have you been given any information that can help you with keeping track of your medications?: Yes  Do you have trouble paying for your medications?: No  Have  you been given any information that can help you with hazards in your house, such as scatter rugs, furniture, etc?: Yes  Do you feel unsteady when standing or walking?: No  Do you worry about falling?: No  Have you fallen two or more times in the past year?: No  Did you suffer any injuries from your falls in the past year?: No     Care Team:   Patient Care Team:  Myer Haff, FNP as PCP - General (Family Nurse Practitioner)  Blanchard Mane, MD as Consulting Physician (Medical Oncology)  Dollene Cleveland, MD  Sharrell Ku, MD as Consulting Physician (Allergy and Immunology)  Nilsa Nutting as Technician  Vlacancich, Ruben Reason, DO as Consulting Physician (Cardiology)      Hospitalizations:   Hospitalization within past year: [x]  No  []  Yes     Diagnosis:      Screenings:       05/24/2022 10/16/2022 05/28/2023   Ambulatory Screenings   Falls Risk: De [place] more than 2 times in past year Y  N   Falls Risk: Suffer any injuries? Y  N   Depression: PHQ2 Total Score 0 0    Depression: PHQ9 Total Score 0 0            Substance Use Disorder Screen:  In the past year, how often have you used the following?  1) Alcohol (For  men, 5 or more drinks a day. For women, 4 or more drinks a day)  [x]  Never []  Once or Twice []  Monthly []  Weekly []  Daily or Almost Daily  2) Tobacco Products  [x]  Never []  Once or Twice []  Monthly []  Weekly []  Daily or Almost Daily  3) Prescription Drugs for Non-Medical Reasons  [x]  Never []  Once or Twice []  Monthly []  Weekly []  Daily or Almost Daily  4) Illegal Drugs  [x]  Never []  Once or Twice []  Monthly []  Weekly []  Daily or Almost Daily             Functional Ability/Level of Safety:   Falls Risk/Home Safety Assessment:  ( see HRA and Screenings sections for additional assessment)  Home Safety: [x]  Stair handrails  [x]  Skid-resistant rugs/remove throw rugs   [x]  Grab bars  [x]  Clear pathways between rooms  [x]  Proper lighting stairs/ bathrooms/bedrooms  Get Up and Go (optional):  [x]    <20 secs  []   >20 secs    []   High risk for falls - Home Safety/Falls Risk Precautions reviewed with pt/family    Hearing Assessment:  Concerns for hearing loss: [x]  Yes  []   No  Hearing aids:   []   Right  [x]   Left  []   Bilateral   []   None  Whisper Test (optional):  []  Normal  []   Slightly decreased  []   Significantly decreased    Just had hearing testing done and is getting hearing aid for her left side    Exercise:  Frequency:  []   No formal exercise  []   1-2x/wk  []   3-4x/wk  [x]   >4x/wk  Duration:  []   15-30 mins/day  []   30-45 mins/day  [x]   45+ mins/day  Intensity:  []   Light  [x]   Moderate  []   Heavy            Activities of Daily Living:   ADL's Independent Minimal  Assistance Moderate  Assistance Total   Assistance   Bathing [x]  []  []  []    Dressing [x]  []  []  []    Mobility   [x]  []  []  []    Transfer [x]  []  []  []    Eating [x]  []  []  []    Toileting [x]  []  []  []      IADL's Independent Minimal  Assistance Moderate  Assistance Total   Assistance   Phone [x]  []  []  []    Housekeeping [x]  []  []  []    Laundry [x]  []  []  []    Transportation []  []  []  [x]    Medications [x]  []  []  []    Finances [x]  []  []  []       ADL assistance: [x]  No assistance needed  []  Spouse  []  Sibling  []  Son   []  Daughter []  Children  []  Home Health Aide []  Other:       Advance Care Planning:   Discussion of Advance Directives:   []  Advance Directive in chart  []  Advance Directive not in chart - requested to provide []  No Advance Directive.  Form Provided  []  No Advance Directive.  Pt declines. []  Not addressed today  []  Other:     Exam:   BP 122/66 (BP Site: Left arm, Patient Position: Sitting, Cuff Size: Medium)   Pulse 80   Temp 98 F (36.7 C) (Oral)   Ht 1.53 m (5' 0.24")   Wt 72 kg (158 lb 12.8 oz)   SpO2 98%   BMI 30.77 kg/m      Physical Exam  General Examination:   GENERAL APPEARANCE: alert, in no acute distress, well developed, well nourished   ORAL CAVITY: normal oropharynx, normal lips, mucosa moist.   NECK/THYROID: neck supple,  no carotid bruit, no neck mass palpated, no jugular venous distention, no thyromegaly.  HEART: S1, S2 normal, no murmurs, rubs, gallops, regular rate and rhythm.   LUNGS: normal effort / no distress, normal breath sounds, clear to auscultation bilaterally, no wheezes, rales, rhonchi.   EXTREMITIES: No LE edema bilaterally.      Evaluation of Cognitive Function:   Mood/affect: [x]  Appropriate  []   Other:   Appearance: [x]  Neatly groomed  [x]  Adequately nourished  []  Other:  Family member/caregiver input: [x]  Present - no concerns  []   Not present in room  []  Present - concerns:    Cognitive Assessment:  Mini-Cog Result (three word registration- banana, sunrise, chair / clock drawing):   []   > 3 points - negative screen for dementia   []  3 recalled words - negative screen for dementia   [x]  1-2 recalled words and normal clock draw - negative for cognitive impairment   []  1-2 recalled words and abnormal clock draw - positive for cognitive impairment   []  0 recalled words - positive for cognitive impairment         Assessment/Plan:   1. Medicare annual wellness visit, subsequent    2. Type 2 diabetes mellitus without complication, without long-term current use of insulin  - metFORMIN (GLUCOPHAGE-XR) 500 MG 24 hr tablet; Take 2 tablets (1,000 mg) by mouth 2 (two) times daily  Dispense: 120 tablet; Refill: 0  Trying XR metformin to see if it minimizes GI side effects. Sent for 1 month. Continue close follow up with endo.     3. Gastroesophageal reflux disease without esophagitis  Continue with pantoprazole in the morning, may take an OTC pepcid in the evening PRN. Will see if switching metformin to XR helps at all. UTD with EGD.    4. Osteopenia of neck of femur, unspecified laterality  - Dxa Bone Density Axial Skeleton; Future    5. Need for vaccination  - Flu vaccine TRIVALENT, 65 yrs and older (FLUZONE HIGH-DOSE) single-dose PF, 0.5 mL    6. Encounter for screening mammogram for malignant neoplasm of breast  - Mammo  Screening 3D/Tomo Bilateral; Future    7. History of allergic reaction  - EPINEPHrine (EPIPEN 2-PAK) 0.3 MG/0.3ML Solution Auto-injector injection; Inject 0.3 mLs (0.3 mg) into the muscle once as needed (Anaphylaxis)  Dispense: 1 each; Refill: 3    8. Asymptomatic menopause  - Dxa Bone Density Axial Skeleton; Future      Education/Counseling:  During the course of the visit, the patient was educated and counseled about appropriate screening and preventive services including:   COVID vaccine  Screening mammography- ordered  Bone densitometry screening- ordered  Colorectal cancer screening- UTD  Physical activity/exercise counseling  Heart healthy diet counseling  Falls risk prevention and home safety      Falls Risk/Home Safety:  The following falls risk/home safety measures were discussed with the patient:  [x]  Home Environment - 1) clear floors, walkways, and stairs of items 2) Remove throw rugs or use non-skid rugs 3) Install grab bars in bathroom 4) Use of handrails with stairs 5) Proper lighting of hallways, bathroom, stairs, bedroom  [x]  Physical Activity - age appropriate activity/exercise 150 mins per week.  [x]  Nutrition - adequate hydration, limit soda, coffee, alcohol   [x]  Vision - yearly eye exam, keep  glasses clean  [x]  Medications - side effect education       Myer Haff, [place]    05/28/2023     The following sections were reviewed this encounter by the provider:   Tobacco  Allergies  Meds  Problems  Med Hx  Surg Hx  Fam Hx          History:   Problem List[1]   Medical History[2]  Past Surgical History[3]  Allergies[4]   Medications Taking[5]  Social History[6]   Family History[7]        ===================================================================    Additional Documentation:                   [1]   Patient Active Problem List  Diagnosis    Hypertension    Hyperlipidemia    H/O total hysterectomy with removal of both tubes and ovaries    Heart block AV first degree    RBBB (right  bundle branch block)    Palpitations    Pure hypercholesterolemia, unspecified    Nonspecific abnormal electrocardiogram (ECG) (EKG)    Type 2 diabetes mellitus without complication, without long-term current use of insulin    Type 2 diabetes mellitus with hyperglycemia    Anemia    History of colonic polyps    Gastroesophageal reflux disease, unspecified whether esophagitis present   [2]   Past Medical History:  Diagnosis Date    Anemia     iron    currently on medication      Arthritis     general body      Arthritis     Generalized    Asthma     Asthma without status asthmaticus     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    Bilateral cataracts     Breast cancer     Breast lump     Cancer     breast cancer    Carcinoma     Carcinoma of the bladder    Chicken pox     Childhood illness    Conduction Disorder     RBBB    Diabetes mellitus type II     Non-Insulin dependent    Echocardiogram 10/2013    Gastroesophageal reflux disease     Hemorrhoids without complication     Holter monitor 02/2014    Hyperlipidemia     Hypertensive disorder     Malignant neoplasm of breast     Measles     Childhood illness    Mumps     Childhood illness    Myocardial perfusion scan 11/2013, 12/2013    MPI single Isotope excrise; MPI dual Isotope Lexiscan    RBBB     Spinal stenosis     Syncope    [3]   Past Surgical History:  Procedure Laterality Date    ABLATION OF DYSRHYTHMIC FOCUS      ARTHROSCOPY, KNEE Left 04/21/2015    Procedure: ARTHROSCOPY, KNEE PARTIAL MEDIAL AND LATERAL MENISECTOMY LEFT KNEE;  Surgeon: Bess Kinds, MD;  Location: [facility];  Service: Orthopedics;  Laterality: Left;  partial medial menisectomy    BREAST BIOPSY      COLONOSCOPY, SCREENING N/A 12/07/2022    Procedure: COLONOSCOPY, SCREENING;  Surgeon: Leilani Merl, MD;  Location: [facility];  Service: Gastroenterology;  Laterality: N/A;    EGD N/A 12/07/2022    Procedure: EGD;  Surgeon: Leilani Merl, MD;  Location: [facility];  Service: Gastroenterology;  Laterality: N/A;    HYSTERECTOMY      total    TUMOR REMOVAL Left     neck  benign    [4]   Allergies  Allergen Reactions    Aspirin      Break out in sweat and dizziness     Other Environmental     Peppers     Pollen Extract     Latex Itching and Rash   [5]   Outpatient Medications Marked as Taking for the 05/28/23 encounter (Office Visit) with Myer Haff, FNP   Medication Sig Dispense Refill    albuterol (PROVENTIL) (2.5 MG/3ML) 0.083% nebulizer solution TAKE 3 MLS (2.5 MG TOTAL) BY NEBULIZATION EVERY 4 (FOUR) HOURS AS NEEDED FOR SHORTNESS OF BREATH OR WHEEZING 75 mL 1    albuterol sulfate HFA (PROVENTIL) 108 (90 Base) MCG/ACT inhaler INHALE 2 PUFFS INTO THE LUNGS EVERY 4 HOURS AS NEEDED FOR WHEEZING OR SHORTNESS OF BREATH. 6.7 each 2    amLODIPine (NORVASC) 10 MG tablet TAKE 1 TABLET (10 MG) BY MOUTH DAILY. 90 tablet 3    atorvastatin (LIPITOR) 40 MG tablet TAKE 1 TABLET BY MOUTH EVERY DAY 90 tablet 3    Blood Glucose Monitoring Suppl (ONE TOUCH ULTRA 2) W/DEVICE KIT One daily 1 each 0    Cholecalciferol (Vitamin D3) 2000 UNIT capsule Take 1 capsule (2,000 Units) by mouth daily 90 capsule 1    dexAMETHasone (DECADRON) 0.1 % ophthalmic solution Place 1 drop into both eyes      empagliflozin (Jardiance) 25 MG tablet Take 1 tablet (25 mg) by mouth every morning 90 tablet 1    EPINEPHrine 0.3 MG/0.3ML auto-injector 1 DOSE IF NEEDED FOR ANAPHYLAXIS      fluticasone (FLONASE) 50 MCG/ACT nasal spray 2 sprays by Nasal route daily      fluticasone (FLOVENT HFA) 220 MCG/ACT inhaler INHALE 1 PUFF INTO THE LUNGS TWICE A DAY 36 each 1    glipiZIDE (GLUCOTROL) 10 MG tablet Take 2 tablets (20 mg) by mouth 2 (two) times daily before meals 360 tablet 1    glucose blood (OneTouch Ultra) test strip 2 times daily 200 each 3    Lancets (onetouch ultrasoft) lancets Use to check blood sugars 2 times daily 200 each 3    lidocaine (Lidoderm) 5 % Place 1 patch onto the skin daily as needed (pain) Remove &  Discard patch within 12 hours 30 patch 0    metFORMIN (GLUCOPHAGE) 1000 MG tablet TAKE 1 TABLET (1,000 MG) BY MOUTH 2 (TWO) TIMES DAILY 180 tablet 1    montelukast (SINGULAIR) 10 MG tablet Take 1 tablet (10 mg) by mouth daily      NASONEX 50 MCG/ACT nasal spray 2 sprays by Nasal route daily as needed  5    pantoprazole (PROTONIX) 40 MG tablet TAKE 1 TABLET BY MOUTH EVERY DAY 90 tablet 1    Tradjenta 5 MG Tab TAKE 1 TABLET BY MOUTH EVERY DAY 90 tablet 1    valsartan-hydroCHLOROthiazide (DIOVAN-HCT) 160-12.5 MG per tablet TAKE 1 TABLET BY MOUTH EVERY DAY 90 tablet 1   [6]   Social History  Tobacco Use    Smoking status: Never     Passive exposure: Never    Smokeless tobacco: Never   Vaping Use    Vaping status: Never Used   Substance Use Topics    Alcohol use: No    Drug use: Never   [7]   Family History  Problem Relation Name  Age of Onset    Diabetes Mother Sierah Wege     Diabetes Sister Lincey Clyburn     Diabetes Sister Madaline Brilliant     Malignant hyperthermia Neg Hx      Pseudochol deficiency Neg Hx      Anesthesia problems Neg Hx

## 2023-05-28 NOTE — Patient Instructions (Signed)
MEDICARE WELLNESS PERSONAL PREVENTION PLAN   As part of the Medicare Wellness portion of your visit today, we are providing you with this personalized preventative plan of care. We have listed below some of the preventative services that are recommended for patients based upon their age and gender. These recommendations are taken directly from the [place] [employer] (USPSTF) and the [employer] on [employer] (ACIP).     Health Maintenance   Topic Date Due    Advance Directive on File  Never done    COVID-19 Vaccine (4 - 2023-24 season) 05/05/2023    DEPRESSION SCREENING  10/17/2023    URINE MICROALBUMIN  11/14/2023    HEMOGLOBIN A1C ANNUAL  03/14/2024    OPHTHALMOLOGY EXAM  04/17/2024    FALLS RISK ANNUAL  05/27/2024    Medicare Annual Wellness Visit  05/27/2024    Tetanus Ten-Year  11/25/2032    HEPATITIS C SCREENING  Completed    INFLUENZA VACCINE  Completed    DXA Scan  Completed    Shingrix Vaccine 50+  Completed    Pneumonia Vaccine Age 24+  Completed    Colorectal Cancer Screening  Discontinued    PAP SMEAR  Discontinued            Immunization History   Administered Date(s) Administered    COVID-19 mRNA MONOVALENT vaccine PRIMARY SERIES 12 years and above (Moderna) 100 mcg/0.5 mL 12/17/2019, 01/14/2020    COVID-19 mRNA MONOVALENT vaccine PRIMARY SERIES 12 years and above [employer]) 30 mcg/0.3 mL (DILUTE BEFORE USE) 08/03/2020    INFLUENZA TRIVALENT HIGH DOSE PF 65 YRS AND OLDER (FLUZONE HIGH-DOSE) 05/19/2014, 09/24/2016, 06/12/2017, 06/20/2021, 05/28/2023    INFLUENZA TRIVALENT PF SD 6 MO AND OLDER (FLUARIX/FLULAVAL) 07/08/2009, 09/15/2010, 07/09/2011, 06/06/2012, 06/03/2013, 07/04/2013    Influenza (Flu) vaccine 07/08/2009, 09/15/2010, 07/09/2011, 06/06/2012, 06/03/2013, 07/04/2013    Influenza vaccine quadrivalent high-dose 65 years and older (FLUZONE HIGH-DOSE) single dose 0.7 mL (PF) 05/02/2019, 05/07/2020, 06/13/2022    Pneumococcal Conjugate 13-Valent  06/10/2018    Pneumococcal polysaccharide vaccine, 23 valent 09/15/2010, 09/04/2011, 05/05/2012    Tdap (tetanus, diphtheria reduced, acellular pertussis), adsorbed vaccine 04/25/2018, 11/26/2022    Zoster (SHINGRIX) vaccine, recombinant 12/09/2022, 04/23/2023        Your major risk factors:   Recommendations for improvement:      The list below includes many common screening recommendations but is not meant to be comprehensive. You may be eligible for other preventative services depending upon your personal risk factors.   Colorectal Cancer Screening - All adults age 36-75 should undergo periodic colorectal cancer screening. The decision to screen for colorectal cancer in adults aged 95 to 64 years should be an individual one,taking into account your overall health and prior screening history.   Breast Cancer Screening - Women age 31-74 should have mammograms every other year (please note that this recommendation may not be appropriate for every woman - your physician can answer specific questions you may have). The USPSTF concludes that the current evidence is insufficient to assess the balance of benefits and harms of screening mammography in women aged 11 years or older.    Cervical Cancer Screening - Women over 30 do not require pap smears as long as prior screening has been normal and are not otherwise at high risk for cervical cancer. For women aged 56 to 55 years, the USPSTF recommends screening every 3 years with cervical cytology alone, every 5 years with high-risk human papillomavirus (hrHPV) testing alone, or every 5  years with hrHPV testing in combination with cytology (cotesting).   Osteoporosis Screening -  The USPSTF recommends screening for osteoporosis with bone measurement testing to prevent osteoporotic fractures in women 65 years and older.  Hepatitis C Screening - Recommend screening for hepatitis C virus (HCV) infection in all adults aged 71 to 81 years.  Lung cancer Screening - Recommend  annual screening for lung cancer with low-dose computed tomography (LDCT) in adults ages 70 to 60 years who have a 20 pack-year smoking history and currently smoke or have quit within the past 15 years.  Recommended Vaccinations   Influenza one dose annually   Tetanus/diphtheria one booster every 10 years   Zoster/Shingles - Shingrix two doses after age 35 (second dose given 2-6 months after first dose)  Pneumovax (PPSV23) one dose for adults aged >=65 years  Prevnar(PCV13) shared clinical decision-making is recommended regarding administration of this vaccine to persons aged >=65 years who do not have an immunocompromising condition, cerebrospinal fluid leak, or cochlear implant.     PERSONAL PREVENTION PLAN   Your Personal Prevention Plan is based on your overall health and your responses to the health questionnaire you completed. The following information is for you to review in addition to the recommendations, referrals, and tests we have discussed at your visit.     Physical Activity:   Physical activity can help you maintain a healthy weight, prevent or control illness, reduce stress, and sleep better. It can also help you improve your balance to avoid falls. Try to build up to and maintain a total of 30 minutes of activity each day. If you are able, try walking, doing yard or housework, and taking the stairs more often. You can also strengthen your muscles with exercises done while sitting or lying down.   Emotional Health:   Feeling "down in the dumps" or anxious every now and then is a natural part of life. If this feeling lasts for a few weeks or more, talk with me as soon as possible. It could be a sign of a problem that needs treatment. There are many types of treatment available.   Falls:   You can reduce your risk of falling by making changes in your home. Remove items that may cause tripping, improve lighting, and consider installing grab bars.   Talk with me if you have problems with balance and  walking. To prevent falls, you may need your vision, hearing, or blood pressure checked. Exercises to improve your strength and balance, or using a cane or walker, may help. Review your medicines with me at every visit, because some can affect balance. Please be sure to let me know if you fall or are fearful you may fall.   Urinary Leakage:   Urine leakage is common, but it is not a normal part of aging. Talk with me about any urine leakage so that the cause can be found and treated. Treatment can include bladder training, exercises, medicine or surgery.   Pain:   We all have aches and pains at times, but chronic pain can change how you feel and live every day. Please talk with me about any symptoms of chronic pain so that we can determine how best to treat.   Sleep:   Getting a good night's sleep is vital to your health and well-being and can help prevent or manage health problems. Often, sleep can be improved by changing behaviors, including when you go to bed and what you do before bed. Sleep  apnea can cause problems such as struggling to stay awake during the day. Please let me know if you would like to learn more about improving your sleep and/or think you may have sleep apnea.   Seat Belt:   Please remember to wear a seat belt when driving or riding in a vehicle. It is one of the most important things you can do to stay safe in a car.   Nutrition:   Remember to eat plenty of fruits, vegetables, whole grains, and dairy. Drink at least 64 ounces (8 full glasses) of water a day, unless you have been advised to limit fluids.   Alcohol:   Alcohol can have a greater effect on older people, who may feel its effects at a lower amount. Older people should limit alcoholic drinks (no more than one a day for women and no more than two a day for men). Please let me know if alcohol use becomes a problem.   Tobacco:   Not smoking or using other forms of tobacco is one of the most important things you can do for your health.  Here is some more information about the importance of quitting smoking and how to quit smoking - [employer].pt  Advance Directives:   There may come a time when medical decisions need to be made on your behalf. Please talk with your family, and with me, about your wishes. It is important to provide information about your decisions, and any formal advance directives, for your medical record. Here is additional information on advanced directives - [email or web address]  Additional Support:   Sometimes it can be challenging to manage all aspects of daily life. Finding the right support can help you maintain or improve your health and independence. Please let me know if you would like to talk further about finding resources to assist you.

## 2023-05-29 ENCOUNTER — Encounter: Payer: Self-pay | Admitting: Family

## 2023-06-03 ENCOUNTER — Other Ambulatory Visit (INDEPENDENT_AMBULATORY_CARE_PROVIDER_SITE_OTHER): Payer: Self-pay | Admitting: Family

## 2023-06-03 DIAGNOSIS — E119 Type 2 diabetes mellitus without complications: Secondary | ICD-10-CM

## 2023-06-03 DIAGNOSIS — I10 Essential (primary) hypertension: Secondary | ICD-10-CM

## 2023-06-06 NOTE — Telephone Encounter (Signed)
LDVM, ok per record of disclosure

## 2023-06-07 NOTE — Telephone Encounter (Signed)
Patient returned call. Tomi spoke with patient. She states that extended release has helped with previous GI concerns and is requesting a refill.

## 2023-06-10 ENCOUNTER — Other Ambulatory Visit (INDEPENDENT_AMBULATORY_CARE_PROVIDER_SITE_OTHER): Payer: Self-pay | Admitting: Family

## 2023-06-10 DIAGNOSIS — E119 Type 2 diabetes mellitus without complications: Secondary | ICD-10-CM

## 2023-06-10 DIAGNOSIS — I10 Essential (primary) hypertension: Secondary | ICD-10-CM

## 2023-06-19 DIAGNOSIS — Z01419 Encounter for gynecological examination (general) (routine) without abnormal findings: Secondary | ICD-10-CM | POA: Diagnosis not present

## 2023-06-19 DIAGNOSIS — N958 Other specified menopausal and perimenopausal disorders: Secondary | ICD-10-CM | POA: Diagnosis not present

## 2023-06-19 DIAGNOSIS — Z1231 Encounter for screening mammogram for malignant neoplasm of breast: Secondary | ICD-10-CM | POA: Diagnosis not present

## 2023-06-19 DIAGNOSIS — Z6826 Body mass index (BMI) 26.0-26.9, adult: Secondary | ICD-10-CM | POA: Diagnosis not present

## 2023-06-19 DIAGNOSIS — E039 Hypothyroidism, unspecified: Secondary | ICD-10-CM | POA: Diagnosis not present

## 2023-06-20 ENCOUNTER — Ambulatory Visit (INDEPENDENT_AMBULATORY_CARE_PROVIDER_SITE_OTHER): Payer: Medicare Other

## 2023-06-20 ENCOUNTER — Ambulatory Visit (INDEPENDENT_AMBULATORY_CARE_PROVIDER_SITE_OTHER): Payer: Self-pay

## 2023-06-20 DIAGNOSIS — E119 Type 2 diabetes mellitus without complications: Secondary | ICD-10-CM

## 2023-06-20 DIAGNOSIS — I10 Essential (primary) hypertension: Secondary | ICD-10-CM

## 2023-06-24 ENCOUNTER — Other Ambulatory Visit: Payer: Self-pay | Admitting: Family Medicine

## 2023-06-24 DIAGNOSIS — I10 Essential (primary) hypertension: Secondary | ICD-10-CM

## 2023-06-24 DIAGNOSIS — N1831 Chronic kidney disease, stage 3a: Secondary | ICD-10-CM

## 2023-06-25 NOTE — Telephone Encounter (Signed)
Requested Prescriptions  Pending Prescriptions Disp Refills   acyclovir (ZOVIRAX) 400 MG tablet [Pharmacy Med Name: Acyclovir Oral Tablet 400 MG] 90 tablet 0    Sig: TAKE 1 TABLET EVERY DAY     Antimicrobials:  Antiviral Agents - Anti-Herpetic Failed - 06/24/2023  2:26 AM      Failed - Valid encounter within last 12 months    Recent Outpatient Visits           1 year ago Vertigo   [facility] Pickard, Priscille Heidelberg, MD   2 years ago Dysuria   [facility] Donita Brooks, MD   2 years ago Benign essential HTN   [facility] Donita Brooks, MD   2 years ago Closed fracture of right hip with routine healing, subsequent encounter   [facility] Donita Brooks, MD   2 years ago Complex cyst of left ovary   [facility] Pickard, Priscille Heidelberg, MD       Future Appointments             In 3 weeks Pickard, Priscille Heidelberg, MD [place] [facility], [facility]   In 1 month Deliah Boston, Eleonore Chiquito, FNP [facility] [place]             atorvastatin (LIPITOR) 40 MG tablet [Pharmacy Med Name: Atorvastatin Calcium Oral Tablet 40 MG] 90 tablet 0    Sig: TAKE 1 TABLET EVERY DAY     Cardiovascular:  Antilipid - Statins Failed - 06/24/2023  2:26 AM      Failed - Valid encounter within last 12 months    Recent Outpatient Visits           1 year ago Vertigo   [facility] Pickard, Priscille Heidelberg, MD   2 years ago Dysuria   [facility] Donita Brooks, MD   2 years ago Benign essential HTN   [facility] Donita Brooks, MD   2 years ago Closed fracture of right hip with routine healing, subsequent encounter   [facility] Donita Brooks, MD   2 years ago Complex cyst of left ovary   [facility] Pickard, Priscille Heidelberg, MD       Future Appointments             In 3 weeks Pickard, Priscille Heidelberg, MD [facility]  Family [facility], [facility]   In 1 month Donnita Falls, FNP [facility] [place]            Failed - Lipid Panel in normal range within the last 12 months    Cholesterol  Date Value Ref Range Status  03/19/2023 107 <200 mg/dL Final   LDL Cholesterol (Calc)  Date Value Ref Range Status  03/19/2023 41 mg/dL (calc) Final    Comment:    Reference range: <100 . Desirable range <100 mg/dL for primary prevention;   <70 mg/dL for patients with CHD or diabetic patients  with > or = 2 CHD risk factors. [name] LDL-C is now calculated using the Martin-Hopkins  calculation, which is a validated novel method providing  better accuracy than the Friedewald equation in the  estimation of LDL-C.  Horald Pollen et al. Lenox Ahr. 6578;469(62): 2061-2068  (http://education.[employer].com/faq/FAQ164)    HDL  Date Value Ref Range Status  03/19/2023 43 (L) > OR = 50 mg/dL Final   Triglycerides  Date Value Ref Range Status  03/19/2023 143 <150 mg/dL Final         Passed - Patient is not pregnant       citalopram (CELEXA) 10 MG tablet [Pharmacy Med Name: Citalopram Hydrobromide Oral Tablet 10 MG] 90 tablet 0    Sig: TAKE 1 TABLET EVERY DAY     Psychiatry:  Antidepressants - SSRI Failed - 06/24/2023  2:26 AM      Failed - Valid encounter within last 6 months    Recent Outpatient Visits           1 year ago Vertigo   [facility] Pickard, Priscille Heidelberg, MD   2 years ago Dysuria   [facility] Donita Brooks, MD   2 years ago Benign essential HTN   [facility] Donita Brooks, MD   2 years ago Closed fracture of right hip with routine healing, subsequent encounter   [facility] Donita Brooks, MD   2 years ago Complex cyst of left ovary   [facility] Pickard, Priscille Heidelberg, MD       Future Appointments             In 3 weeks Pickard, Priscille Heidelberg, MD [facility], [facility]   In 1  month Deliah Boston, Eleonore Chiquito, FNP [facility] [place]             losartan (COZAAR) 50 MG tablet [Pharmacy Med Name: Losartan Potassium Oral Tablet 50 MG] 90 tablet 0    Sig: TAKE 1 TABLET EVERY DAY     Cardiovascular:  Angiotensin Receptor Blockers Failed - 06/24/2023  2:26 AM      Failed - Last BP in normal range    BP Readings from Last 1 Encounters:  04/25/23 (!) 147/81         Failed - Valid encounter within last 6 months    Recent Outpatient Visits           1 year ago Vertigo   [facility] Tanya Nones, Priscille Heidelberg, MD   2 years ago Dysuria   [facility] Donita Brooks, MD   2 years ago Benign essential HTN   [facility] Donita Brooks, MD   2 years ago Closed fracture of right hip with routine healing, subsequent encounter   [facility] Donita Brooks, MD   2 years ago Complex cyst of left ovary   [facility] Pickard, Priscille Heidelberg, MD       Future Appointments             In 3 weeks Pickard, Priscille Heidelberg, MD [facility], [facility]   In 1 month Donnita Falls, FNP [facility]             Passed - Cr in normal range and within 180 days    Creat  Date Value Ref Range Status  03/19/2023 0.80 0.60 - 1.00 mg/dL Final   Creatinine, Urine  Date Value Ref Range Status  06/15/2022 192 20 - 275 mg/dL Final         Passed - K in normal range and within 180 days    Potassium  Date Value Ref Range Status  03/19/2023 4.2 3.5 - 5.3 mmol/L Final         Passed - Patient is not pregnant

## 2023-07-09 ENCOUNTER — Telehealth (INDEPENDENT_AMBULATORY_CARE_PROVIDER_SITE_OTHER): Payer: Self-pay | Admitting: Family

## 2023-07-09 ENCOUNTER — Encounter (INDEPENDENT_AMBULATORY_CARE_PROVIDER_SITE_OTHER): Payer: Self-pay | Admitting: Family

## 2023-07-09 NOTE — Telephone Encounter (Signed)
[facility]  Fax# [phone]  Latest office notes;EKG and labs

## 2023-07-09 NOTE — Telephone Encounter (Signed)
 Completed.

## 2023-07-10 ENCOUNTER — Encounter: Payer: Self-pay | Admitting: Family Medicine

## 2023-07-11 ENCOUNTER — Encounter (INDEPENDENT_AMBULATORY_CARE_PROVIDER_SITE_OTHER): Payer: Self-pay | Admitting: Residents

## 2023-07-11 ENCOUNTER — Ambulatory Visit (INDEPENDENT_AMBULATORY_CARE_PROVIDER_SITE_OTHER): Payer: Medicare Other | Admitting: Residents

## 2023-07-11 VITALS — BP 130/70 | HR 86 | Ht 62.0 in | Wt 155.0 lb

## 2023-07-11 DIAGNOSIS — I4510 Unspecified right bundle-branch block: Secondary | ICD-10-CM

## 2023-07-11 DIAGNOSIS — I440 Atrioventricular block, first degree: Secondary | ICD-10-CM

## 2023-07-11 DIAGNOSIS — E7800 Pure hypercholesterolemia, unspecified: Secondary | ICD-10-CM

## 2023-07-11 DIAGNOSIS — I10 Essential (primary) hypertension: Secondary | ICD-10-CM

## 2023-07-11 LAB — ECG 12-LEAD
Atrial Rate: 82 {beats}/min
IHS MUSE NARRATIVE AND IMPRESSION: NORMAL
P Axis: 67 degrees
P-R Interval: 176 ms
Q-T Interval: 410 ms
QRS Duration: 148 ms
QTC Calculation (Bezet): 479 ms
R Axis: -32 degrees
T Axis: 62 degrees
Ventricular Rate: 82 {beats}/min

## 2023-07-11 NOTE — Progress Notes (Signed)
 [place] [facility] OFFICE PROGRESS NOTE    [facility] [place] [facility] [place] OFF[facility] -CARDIOLOGY  [address]  [place] [address]  Dept: [phone]  Dept Fax: [phone]       Patient Name: Helen [name], Helen [name]

## 2023-07-16 ENCOUNTER — Ambulatory Visit (INDEPENDENT_AMBULATORY_CARE_PROVIDER_SITE_OTHER): Payer: Medicare Other | Admitting: Family Medicine

## 2023-07-16 VITALS — HR 76 | Temp 98.1°F | Ht 66.0 in | Wt 163.6 lb

## 2023-07-16 DIAGNOSIS — M542 Cervicalgia: Secondary | ICD-10-CM

## 2023-07-16 DIAGNOSIS — N1831 Chronic kidney disease, stage 3a: Secondary | ICD-10-CM

## 2023-07-16 DIAGNOSIS — M5030 Other cervical disc degeneration, unspecified cervical region: Secondary | ICD-10-CM

## 2023-07-16 DIAGNOSIS — E038 Other specified hypothyroidism: Secondary | ICD-10-CM | POA: Diagnosis not present

## 2023-07-16 MED ORDER — CYCLOBENZAPRINE HCL 5 MG PO TABS: 5.0000 mg | ORAL_TABLET | Freq: Three times a day (TID) | ORAL | 1 refills | Status: DC | PRN

## 2023-07-16 NOTE — Progress Notes (Signed)
Subjective:    Patient ID: April Beck, female    DOB: March 16, 1946, 77 y.o.   MRN: [id number]  Patient is a 77 year old Caucasian female with a history of chronic kidney disease and hypertension who is here today for a check up.  For the last several months she has been dealing with neck pain.  She states that it hurts to turn her head side-to-side.  She denies any neuropathic pain radiating down the arms.  She denies any weakness in her arms.  She denies any numbness or tingling in her arms.  Patient has CT scan of the neck in February of this year that did show degenerative disc disease between C3 and C5.  There was no significant nerve impingement.  The patient is unable to take NSAIDs due to her chronic kidney disease.  Tylenol is not helping very much. Immunization History  Administered Date(s) Administered   Fluad Quad(high Dose 77+) 05/21/2019, 06/19/2021, 06/15/2022   Influenza Split 06/20/2012, 06/29/2020   Influenza, High Dose Seasonal PF 06/20/2017, 07/11/2018, 06/29/2020   Influenza,inj,Quad PF,6+ Mos 06/01/2013, 05/13/2014, 06/27/2015   Influenza-Unspecified 06/18/2023   PFIZER(Purple Top)SARS-COV-2 Vaccination 11/30/2019, 12/23/2019, 06/07/2020   Pfizer Covid-19 Vaccine Bivalent Booster 47yrs & up 07/04/2021   Pfizer(Comirnaty)Fall Seasonal Vaccine 12 years and older 06/20/2022, 06/18/2023   Pneumococcal Conjugate-13 10/23/2013   Pneumococcal Polysaccharide-23 06/20/2012   Respiratory Syncytial Virus Vaccine,Recomb Aduvanted(Arexvy) 06/27/2022   Tdap 06/20/2017   Zoster Recombinant(Shingrix) 08/29/2017, 12/13/2017   Zoster, Live 08/04/2009   She does report to the.  Outside of her fatigue her review of systems is negative.  She did recently have a urinary tract infection repeat urinalysis today shows no nitrates, no leukocyte esterase, and no blood indicating resolution Past Medical History:  Diagnosis Date   Anxiety    hx of panic attack   Arthritis    hands & knees    CKD (chronic kidney disease), stage III (HCC)    followed by pcp   Closed fracture of left distal radius 10/31/2021   Edema of both lower extremities    GERD (gastroesophageal reflux disease)    History of 2019 novel coronavirus disease (COVID-19) 09/17/2020   positive result in epic,  per pt very mild symtpoms that resolved   History of uterine fibroid    Hypertension    followed by pcp   Hypomagnesemia    Hypothyroidism    followed by pcp   IDA (iron deficiency anemia)    Osteopenia    Ovarian cyst    Pelvic pain    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, takes meloxicam   Wears contact lenses    Past Surgical History:  Procedure Laterality Date   ABDOMINAL HYSTERECTOMY  1982   CATARACT EXTRACTION Bilateral    CHOLECYSTECTOMY  06/20/2012   Procedure: LAPAROSCOPIC CHOLECYSTECTOMY;  Surgeon: Liz Malady, MD;  Location: [facility] OR;  Service: General;  Laterality: N/A;   COLONOSCOPY  last one 03-20-2019  dr Christella Hartigan   DIAGNOSTIC LAPAROSCOPY  yrs ago   HIP ARTHROPLASTY Right 11/13/2020   Procedure: ARTHROPLASTY BIPOLAR HIP (HEMIARTHROPLASTY);  Surgeon: Vickki Hearing, MD;  Location: [facility];  Service: Orthopedics;  Laterality: Right;   INCONTINENCE SURGERY  09-11-2005   dr Annabell Howells  @[email or web address]    LYNX SLING   LAPAROSCOPIC SALPINGO OOPHERECTOMY Bilateral 01/16/2021   Procedure: DIAGNOSTIC LAPAROSCOPY;  Surgeon: Richarda Overlie, MD;  Location: [facility];  Service: Gynecology;  Laterality: Bilateral;   LAPAROSCOPY Bilateral 01/16/2021  Procedure: EXPLORATORY LAPAROTOMY BILATERAL SALPINGO OOPHORECTOMY;  Surgeon: Richarda Overlie, MD;  Location: [facility];  Service: Gynecology;  Laterality: Bilateral;   TUBAL LIGATION     Current Outpatient Medications on File Prior to Visit  Medication Sig Dispense Refill   acetaminophen (TYLENOL) 325 MG tablet Take 2 tablets (650 mg total) by mouth every 6 (six) hours as needed for pain. (Patient  taking differently: Take 650 mg by mouth every 6 (six) hours as needed for pain. Arthritis formula per pt)     acyclovir (ZOVIRAX) 400 MG tablet TAKE 1 TABLET EVERY DAY 90 tablet 0   atorvastatin (LIPITOR) 40 MG tablet TAKE 1 TABLET EVERY DAY 90 tablet 0   calcium-vitamin D (OSCAL WITH D) 250-125 MG-UNIT tablet Take 1 tablet by mouth daily.     citalopram (CELEXA) 10 MG tablet TAKE 1 TABLET EVERY DAY 90 tablet 0   estradiol (ESTRACE VAGINAL) 0.1 MG/GM vaginal cream Place 1 Applicatorful vaginally at bedtime. 42 each 12   fluocinonide gel (LIDEX) 0.05 % Apply 1 application topically 3 (three) times daily as needed (rash).     levothyroxine (SYNTHROID) 50 MCG tablet TAKE 1 TABLET EVERY DAY 90 tablet 2   losartan (COZAAR) 50 MG tablet TAKE 1 TABLET EVERY DAY 90 tablet 0   pantoprazole (PROTONIX) 40 MG tablet TAKE 1 TABLET TWICE DAILY 180 tablet 2   No current facility-administered medications on file prior to visit.   No Known Allergies Social History   Socioeconomic History   Marital status: Divorced    Spouse name: Not on file   Number of children: Not on file   Years of education: Not on file   Highest education level: 12th grade  Occupational History   Not on file  Tobacco Use   Smoking status: Never   Smokeless tobacco: Never  Vaping Use   Vaping status: Never Used  Substance and Sexual Activity   Alcohol use: No   Drug use: Never   Sexual activity: Not Currently    Birth control/protection: Surgical  Other Topics Concern   Not on file  Social History Narrative   Not on file   Social Determinants of Health   Financial Resource Strain: Low Risk  (12/14/2022)   Overall Financial Resource Strain (CARDIA)    Difficulty of Paying Living Expenses: Not very hard  Food Insecurity: No Food Insecurity (07/12/2023)   Hunger Vital Sign    Worried About Running Out of Food in the Last Year: Never true    Ran Out of Food in the Last Year: Never true  Transportation Needs: No  Transportation Needs (07/12/2023)   PRAPARE - Administrator, Civil Service (Medical): No    Lack of Transportation (Non-Medical): No  Physical Activity: Unknown (07/12/2023)   Exercise Vital Sign    Days of Exercise per Week: 0 days    Minutes of Exercise per Session: Not on file  Stress: No Stress Concern Present (07/12/2023)   [employer] of Occupational Health - Occupational Stress Questionnaire    Feeling of Stress : Not at all  Social Connections: Unknown (07/12/2023)   Social Connection and Isolation Panel [NHANES]    Frequency of Communication with Friends and Family: Patient declined    Frequency of Social Gatherings with Friends and Family: Twice a week    Attends Religious Services: More than 4 times per year    Active Member of [employer] or Organizations: No    Attends Banker Meetings: Not  on file    Marital Status: Divorced  Intimate Partner Violence: Not At Risk (02/23/2021)   Humiliation, Afraid, Rape, and Kick questionnaire    Fear of Current or Ex-Partner: No    Emotionally Abused: No    Physically Abused: No    Sexually Abused: No       Review of Systems  All other systems reviewed and are negative.      Objective:   Physical Exam Vitals reviewed.  Constitutional:      General: She is not in acute distress.    Appearance: She is well-developed. She is not diaphoretic.  HENT:     Head: Normocephalic and atraumatic.     Right Ear: External ear normal.     Left Ear: External ear normal.     Nose: Nose normal.     Mouth/Throat:     Pharynx: No oropharyngeal exudate.  Eyes:     General: No scleral icterus.       Right eye: No discharge.        Left eye: No discharge.     Conjunctiva/sclera: Conjunctivae normal.     Pupils: Pupils are equal, round, and reactive to light.  Neck:     Thyroid: No thyromegaly.     Vascular: No JVD.     Trachea: No tracheal deviation.  Cardiovascular:     Rate and Rhythm: Normal rate and regular  rhythm.     Heart sounds: Normal heart sounds. No murmur heard.    No friction rub. No gallop.  Pulmonary:     Effort: Pulmonary effort is normal. No respiratory distress.     Breath sounds: Normal breath sounds. No stridor. No wheezing or rales.  Chest:     Chest wall: No tenderness.  Abdominal:     General: Bowel sounds are normal. There is no distension.     Palpations: Abdomen is soft. There is no mass.     Tenderness: There is no abdominal tenderness. There is no guarding or rebound.  Musculoskeletal:        General: No tenderness or deformity.     Cervical back: Neck supple. No edema or rigidity. Pain with movement and muscular tenderness present. No spinous process tenderness. Decreased range of motion.  Lymphadenopathy:     Cervical: No cervical adenopathy.  Skin:    General: Skin is warm.     Coloration: Skin is not pale.     Findings: No erythema or rash.  Neurological:     Mental Status: She is alert and oriented to person, place, and time.     Cranial Nerves: No cranial nerve deficit.     Motor: No abnormal muscle tone.     Coordination: Coordination normal.     Deep Tendon Reflexes: Reflexes are normal and symmetric. Reflexes normal.  Psychiatric:        Behavior: Behavior normal.        Thought Content: Thought content normal.        Judgment: Judgment normal.           Assessment & Plan:  Stage 3a chronic kidney disease (HCC) - Plan: CBC with Differential/Platelet, COMPLETE METABOLIC PANEL WITH GFR, Lipid panel  Other specified hypothyroidism - Plan: TSH  Neck pain - Plan: Ambulatory referral to Physical Therapy  DDD (degenerative disc disease), cervical - Plan: Ambulatory referral to Physical Therapy Blood pressure is borderline today.  Check CBC CMP lipid panel and TSH.  Continue Tylenol for for pain and muscle pain.  General see Flexeril 5 mg every hour as needed for neck stiffness and back pain.  I recommended trying physical therapy.  I believe this  may help her neck pain is much of any medication.  If she is not seeing significant benefit from physical therapy, we could consult orthopedics for possible epidural steroid injections.

## 2023-07-17 LAB — CBC WITH DIFFERENTIAL/PLATELET
Absolute Lymphocytes: 2413 {cells}/uL (ref 850–3900)
Absolute Monocytes: 454 {cells}/uL (ref 200–950)
Basophils Absolute: 18 {cells}/uL (ref 0–200)
Basophils Relative: 0.3 %
Eosinophils Absolute: 89 {cells}/uL (ref 15–500)
Eosinophils Relative: 1.5 %
HCT: 39.5 % (ref 35.0–45.0)
Hemoglobin: 13.2 g/dL (ref 11.7–15.5)
MCH: 31.8 pg (ref 27.0–33.0)
MCHC: 33.4 g/dL (ref 32.0–36.0)
MCV: 95.2 fL (ref 80.0–100.0)
MPV: 9.9 fL (ref 7.5–12.5)
Monocytes Relative: 7.7 %
Neutro Abs: 2926 {cells}/uL (ref 1500–7800)
Neutrophils Relative %: 49.6 %
Platelets: 203 10*3/uL (ref 140–400)
RBC: 4.15 Million/uL (ref 3.80–5.10)
RDW: 11.1 % (ref 11.0–15.0)
Total Lymphocyte: 40.9 %
WBC: 5.9 10*3/uL (ref 3.8–10.8)

## 2023-07-17 LAB — COMPLETE METABOLIC PANEL WITHOUT GFR
AG Ratio: 1.9 (calc) (ref 1.0–2.5)
ALT: 14 U/L (ref 6–29)
AST: 18 U/L (ref 10–35)
Albumin: 4.1 g/dL (ref 3.6–5.1)
Alkaline phosphatase (APISO): 61 U/L (ref 37–153)
BUN: 18 mg/dL (ref 7–25)
CO2: 27 mmol/L (ref 20–32)
Calcium: 9.3 mg/dL (ref 8.6–10.4)
Chloride: 103 mmol/L (ref 98–110)
Creat: 0.83 mg/dL (ref 0.60–1.00)
Globulin: 2.2 g/dL (ref 1.9–3.7)
Glucose, Bld: 92 mg/dL (ref 65–99)
Potassium: 4.5 mmol/L (ref 3.5–5.3)
Sodium: 138 mmol/L (ref 135–146)
Total Bilirubin: 0.6 mg/dL (ref 0.2–1.2)
Total Protein: 6.3 g/dL (ref 6.1–8.1)
eGFR: 73 mL/min/{1.73_m2}

## 2023-07-17 LAB — LIPID PANEL
Cholesterol: 127 mg/dL
HDL: 54 mg/dL
LDL Cholesterol (Calc): 51 mg/dL
Non-HDL Cholesterol (Calc): 73 mg/dL
Total CHOL/HDL Ratio: 2.4 (calc)
Triglycerides: 139 mg/dL

## 2023-07-17 LAB — TSH: TSH: 1.31 m[IU]/L (ref 0.40–4.50)

## 2023-07-19 ENCOUNTER — Ambulatory Visit (INDEPENDENT_AMBULATORY_CARE_PROVIDER_SITE_OTHER): Payer: Medicare Other

## 2023-07-19 DIAGNOSIS — E119 Type 2 diabetes mellitus without complications: Secondary | ICD-10-CM

## 2023-07-19 DIAGNOSIS — I10 Essential (primary) hypertension: Secondary | ICD-10-CM

## 2023-07-22 NOTE — Therapy (Signed)
OUTPATIENT PHYSICAL THERAPY CERVICAL EVALUATION   Patient Name: April Beck MRN: [id number] DOB:[birth year], 77 y.o., female Today's Date: 07/23/2023  END OF SESSION:  PT End of Session - 07/23/23 1345     Visit Number 1    Number of Visits 16    Date for PT Re-Evaluation 09/03/23    Progress Note Due on Visit 10    PT Start Time 1345    PT Stop Time 1428    PT Time Calculation (min) 43 min    Activity Tolerance Patient tolerated treatment well    Behavior During Therapy WFL for tasks assessed/performed             Past Medical History:  Diagnosis Date   Anxiety    hx of panic attack   Arthritis    hands & knees   CKD (chronic kidney disease), stage III (HCC)    followed by pcp   Closed fracture of left distal radius 10/31/2021   Edema of both lower extremities    GERD (gastroesophageal reflux disease)    History of 2019 novel coronavirus disease (COVID-19) 09/17/2020   positive result in epic,  per pt very mild symtpoms that resolved   History of uterine fibroid    Hypertension    followed by pcp   Hypomagnesemia    Hypothyroidism    followed by pcp   IDA (iron deficiency anemia)    Osteopenia    Ovarian cyst    Pelvic pain    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, takes meloxicam   Wears contact lenses    Past Surgical History:  Procedure Laterality Date   ABDOMINAL HYSTERECTOMY  1982   CATARACT EXTRACTION Bilateral    CHOLECYSTECTOMY  06/20/2012   Procedure: LAPAROSCOPIC CHOLECYSTECTOMY;  Surgeon: Liz Malady, MD;  Location: [facility];  Service: General;  Laterality: N/A;   COLONOSCOPY  last one 03-20-2019  dr Christella Hartigan   DIAGNOSTIC LAPAROSCOPY  yrs ago   HIP ARTHROPLASTY Right 11/13/2020   Procedure: ARTHROPLASTY BIPOLAR HIP (HEMIARTHROPLASTY);  Surgeon: Vickki Hearing, MD;  Location: [facility];  Service: Orthopedics;  Laterality: Right;   INCONTINENCE SURGERY  09-11-2005   dr Annabell Howells  @[facility]    LYNX SLING   LAPAROSCOPIC  SALPINGO OOPHERECTOMY Bilateral 01/16/2021   Procedure: DIAGNOSTIC LAPAROSCOPY;  Surgeon: Richarda Overlie, MD;  Location: [facility];  Service: Gynecology;  Laterality: Bilateral;   LAPAROSCOPY Bilateral 01/16/2021   Procedure: EXPLORATORY LAPAROTOMY BILATERAL SALPINGO OOPHORECTOMY;  Surgeon: Richarda Overlie, MD;  Location: [facility];  Service: Gynecology;  Laterality: Bilateral;   TUBAL LIGATION     Patient Active Problem List   Diagnosis Date Noted   Suburethral sling present 04/25/2023   Arthritis of carpometacarpal [facility]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 01/16/2022   Ovarian cyst 01/16/2021   Chronic constipation 11/21/2020   CKD (chronic kidney disease) stage 3, GFR 30-59 ml/min (HCC) 11/21/2020   Herpes 11/21/2020   Hypokalemia 11/21/2020   Weakness 11/17/2020   Aspiration into airway    Fall    Dysphagia 06/14/2013   Atrophic vaginitis 06/14/2013   Heel pain 06/14/2013   Hypercholesteremia    Hypertension    GERD (gastroesophageal reflux disease)    Hypothyroidism    Renal insufficiency 06/19/2012    PCP: Donita Brooks, MD   REFERRING PROVIDER: Donita Brooks, MD   REFERRING DIAG:  M54.2 (ICD-10-CM) - Neck pain M50.30 (ICD-10-CM) - DDD (degenerative disc disease), cervical  THERAPY DIAG:  Neck tightness  Muscle weakness (generalized)  Neck pain  Rationale for Evaluation and Treatment: Rehabilitation  ONSET DATE: >1 year   SUBJECTIVE:                                                                                                                                                                                                         SUBJECTIVE STATEMENT: Patient reports the pain goes up her neck and head leading to a headache. Also reports back pain after picking up her trash can but seems to be getting better. Neck pain is localized to neck and head. Difficulty turning head L/R (worse to the R).  Hand dominance: Right  PERTINENT  HISTORY:  Per MD note on 07/16/23, "For the last several months she has been dealing with neck pain. She states that it hurts to turn her head side-to-side. She denies any neuropathic pain radiating down the arms. She denies any weakness in her arms. She denies any numbness or tingling in her arms. Patient has CT scan of the neck in February of this year that did show degenerative disc disease between C3 and C5. There was no significant nerve impingement. The patient is unable to take NSAIDs due to her chronic kidney disease. Tylenol is not helping very much." PAIN:  Are you having pain? Yes: NPRS scale: 8/10 Pain location: neck while turning  Pain description: sharp, stabbing   Aggravating factors: turning  Relieving factors: stop turning   PRECAUTIONS: None  RED FLAGS: None     WEIGHT BEARING RESTRICTIONS: No  FALLS:  Has patient fallen in last 6 months? No  LIVING ENVIRONMENT: Lives with: lives alone Lives in: House/apartment Stairs: Yes: External: 4 steps; can reach both Has following equipment at home: Single point cane and Vinod Mikesell - 2 wheeled  OCCUPATION: retired   PLOF: Independent  PATIENT GOALS: to help me deal with the pain   OBJECTIVE:  Note: Objective measures were completed at Evaluation unless otherwise noted.  DIAGNOSTIC FINDINGS:  N/A  PATIENT SURVEYS:  NDI 24%  COGNITION: Overall cognitive status: Within functional limits for tasks assessed  SENSATION: WFL  POSTURE: rounded shoulders and forward head  PALPATION: Trigger points notable in B UT (R>L) Significant tightness in B UT, cervical paraspinals, and suboccipitals    CERVICAL ROM:   Active ROM A/PROM (%) eval  Flexion WFL  Extension WFL  Right lateral flexion 50%  Left lateral flexion 50%  Right rotation 50%   Left rotation 50%   (Blank rows = not tested)  UPPER EXTREMITY ROM:  B UE  AROM WFL    UPPER EXTREMITY MMT:  MMT Right eval Left eval  Shoulder flexion 4-* 4-*   Shoulder extension    Shoulder abduction 4-* 4-*  Shoulder adduction    Shoulder internal rotation 4 4  Shoulder external rotation 4 4  Middle trapezius    Lower trapezius    Elbow flexion 4+ 4+  Elbow extension 4+ 4+   (Blank rows = not tested)  CERVICAL SPECIAL TESTS:  N/A     TODAY'S TREATMENT:                                                                                                                              DATE: 07/23/23   Supine UT stretch with ipsilateral GHJ overpressure (bilateral) 2 x 30 seconds each side    PATIENT EDUCATION:  Education details: HEP, POC, goals  Person educated: Patient Education method: Explanation, Demonstration, and Handouts Education comprehension: verbalized understanding, returned demonstration, and needs further education  HOME EXERCISE PROGRAM: Access Code: [email or web address] URL: [email or web address] Date: 07/23/2023 Prepared by: Maylon Peppers  Exercises - Seated Flexion Stretch  - 2-3 x daily - 5-7 x weekly - 10 reps - Seated Upper Trapezius Stretch  - 2-3 x daily - 5-7 x weekly - 3-5 reps - 10-30 second hold - Seated Levator Scapulae Stretch  - 2-3 x daily - 5-7 x weekly - 3-5 reps - 10-30 second hold  ASSESSMENT:  CLINICAL IMPRESSION: Patient is a 77 y.o. female who was seen today for physical therapy evaluation and treatment for neck pain. Patient demonstrates significant muscle tightness in B UT with associated trigger points (R>L). Patient with B UE weakness 2/2 pain and impaired cervical ROM. During examination, provided patient with manual B UT stretch with GHJ overpressure with slight relief of neck pain in supine and with cervical rotation. HEP provided with UT and levator stretch, patient demonstrated understanding. Patient will benefit from skilled PT interventions to address listed impairments to improve cervical rotation with reduction in pain.   OBJECTIVE IMPAIRMENTS: decreased activity tolerance, decreased  ROM, decreased strength, hypomobility, impaired flexibility, postural dysfunction, and pain.   ACTIVITY LIMITATIONS: carrying and lifting  PARTICIPATION LIMITATIONS: cleaning, driving, and yard work  PERSONAL FACTORS: Age and Behavior pattern are also affecting patient's functional outcome.   REHAB POTENTIAL: Good  CLINICAL DECISION MAKING: Stable/uncomplicated  EVALUATION COMPLEXITY: Low   GOALS: Goals reviewed with patient? Yes  SHORT TERM GOALS: Target date: 08/12/2023  Patient will be independent in HEP to improve strength/mobility for better functional independence with ADLs. Baseline:  Goal status: INITIAL   LONG TERM GOALS: Target date: 09/02/2023   Patient will reduce Neck Disability Index score to <10% to demonstrate minimal disability with ADL's including improved sleeping tolerance, sitting tolerance, etc for better mobility at home and work. Baseline:  Goal status: INITIAL  2.  Patient will improve cervical rotation and sidebending ROM with reports of <2/10 pain.  Baseline:  Goal status: INITIAL  3.  Patient will improve B UE strength to 4+/5 or greater to improve ability to perform ADLs and other household work.  Baseline:  Goal status: INITIAL  4.  Patient will report <2/10 pain in neck on NRPS while driving turning L/R to improve ability to complete driving and other activities involving cervical rotation.  Baseline:  Goal status: INITIAL   PLAN:  PT FREQUENCY: 1-2x/week  PT DURATION: 6 weeks  PLANNED INTERVENTIONS: 97164- PT Re-evaluation, 97110-Therapeutic exercises, 97530- Therapeutic activity, 97112- Neuromuscular re-education, 97535- Self Care, 16109- Manual therapy, 97014- Electrical stimulation (unattended), [phone]- Electrical stimulation (manual), Patient/Family education, Dry Needling, Joint mobilization, Joint manipulation, Spinal manipulation, Spinal mobilization, Cryotherapy, and Moist heat  PLAN FOR NEXT SESSION: HEP review, manual UT  stretching, trigger point release, suboccipital STM and release   Viviann Spare, PT, DPT  07/23/2023, 3:21 PM

## 2023-07-23 ENCOUNTER — Encounter (HOSPITAL_COMMUNITY): Payer: Self-pay

## 2023-07-23 ENCOUNTER — Ambulatory Visit (HOSPITAL_COMMUNITY): Payer: Medicare Other | Attending: Family Medicine

## 2023-07-23 DIAGNOSIS — M6281 Muscle weakness (generalized): Secondary | ICD-10-CM | POA: Insufficient documentation

## 2023-07-23 DIAGNOSIS — M5030 Other cervical disc degeneration, unspecified cervical region: Secondary | ICD-10-CM | POA: Insufficient documentation

## 2023-07-23 DIAGNOSIS — M542 Cervicalgia: Secondary | ICD-10-CM | POA: Diagnosis not present

## 2023-07-23 DIAGNOSIS — R29898 Other symptoms and signs involving the musculoskeletal system: Secondary | ICD-10-CM | POA: Insufficient documentation

## 2023-07-25 ENCOUNTER — Ambulatory Visit: Payer: Medicare Other | Admitting: Urology

## 2023-07-25 ENCOUNTER — Other Ambulatory Visit (INDEPENDENT_AMBULATORY_CARE_PROVIDER_SITE_OTHER): Payer: Self-pay | Admitting: Family

## 2023-07-25 DIAGNOSIS — E119 Type 2 diabetes mellitus without complications: Secondary | ICD-10-CM

## 2023-07-29 ENCOUNTER — Telehealth: Payer: Self-pay

## 2023-07-29 NOTE — Telephone Encounter (Signed)
Copied from CRM [id number]. Topic: Appointments - Appointment Scheduling >> Jul 25, 2023  3:40 PM Hector Shade B wrote: Patient/patient representative is calling to schedule an appointment. Refer to attachments for appointment information. Follow-up blood work

## 2023-07-31 ENCOUNTER — Ambulatory Visit (HOSPITAL_COMMUNITY): Payer: Medicare Other | Admitting: Physical Therapy

## 2023-07-31 ENCOUNTER — Ambulatory Visit (INDEPENDENT_AMBULATORY_CARE_PROVIDER_SITE_OTHER): Payer: Medicare Other | Admitting: Family

## 2023-07-31 ENCOUNTER — Encounter (INDEPENDENT_AMBULATORY_CARE_PROVIDER_SITE_OTHER): Payer: Self-pay | Admitting: Family

## 2023-07-31 VITALS — BP 128/71 | HR 91 | Temp 98.1°F | Wt 159.2 lb

## 2023-07-31 DIAGNOSIS — J069 Acute upper respiratory infection, unspecified: Secondary | ICD-10-CM

## 2023-07-31 DIAGNOSIS — J4541 Moderate persistent asthma with (acute) exacerbation: Secondary | ICD-10-CM

## 2023-07-31 DIAGNOSIS — M6281 Muscle weakness (generalized): Secondary | ICD-10-CM

## 2023-07-31 DIAGNOSIS — M5030 Other cervical disc degeneration, unspecified cervical region: Secondary | ICD-10-CM | POA: Diagnosis not present

## 2023-07-31 DIAGNOSIS — R29898 Other symptoms and signs involving the musculoskeletal system: Secondary | ICD-10-CM

## 2023-07-31 DIAGNOSIS — M542 Cervicalgia: Secondary | ICD-10-CM

## 2023-07-31 MED ORDER — AZITHROMYCIN 250 MG PO TABS
ORAL_TABLET | ORAL | 0 refills | Status: AC
Start: 2023-07-31 — End: 2023-08-05

## 2023-07-31 NOTE — Therapy (Signed)
OUTPATIENT PHYSICAL THERAPY TREATMENT   Patient Name: April Beck MRN: [id number] DOB:[birth year], 77 y.o., female Today's Date: 07/31/2023  END OF SESSION:  PT End of Session - 07/31/23 1312     Visit Number 2    Number of Visits 16    Date for PT Re-Evaluation 09/03/23    Progress Note Due on Visit 10    PT Start Time 1305    PT Stop Time 1352    PT Time Calculation (min) 47 min    Activity Tolerance Patient tolerated treatment well    Behavior During Therapy [facility] for tasks assessed/performed             Past Medical History:  Diagnosis Date   Anxiety    hx of panic attack   Arthritis    hands & knees   CKD (chronic kidney disease), stage III (HCC)    followed by pcp   Closed fracture of left distal radius 10/31/2021   Edema of both lower extremities    GERD (gastroesophageal reflux disease)    History of 2019 novel coronavirus disease (COVID-19) 09/17/2020   positive result in epic,  per pt very mild symtpoms that resolved   History of uterine fibroid    Hypertension    followed by pcp   Hypomagnesemia    Hypothyroidism    followed by pcp   IDA (iron deficiency anemia)    Osteopenia    Ovarian cyst    Pelvic pain    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, takes meloxicam   Wears contact lenses    Past Surgical History:  Procedure Laterality Date   ABDOMINAL HYSTERECTOMY  1982   CATARACT EXTRACTION Bilateral    CHOLECYSTECTOMY  06/20/2012   Procedure: LAPAROSCOPIC CHOLECYSTECTOMY;  Surgeon: Liz Malady, MD;  Location: [facility];  Service: General;  Laterality: N/A;   COLONOSCOPY  last one 03-20-2019  dr Christella Hartigan   DIAGNOSTIC LAPAROSCOPY  yrs ago   HIP ARTHROPLASTY Right 11/13/2020   Procedure: ARTHROPLASTY BIPOLAR HIP (HEMIARTHROPLASTY);  Surgeon: Vickki Hearing, MD;  Location: [facility];  Service: Orthopedics;  Laterality: Right;   INCONTINENCE SURGERY  09-11-2005   dr Annabell Howells  @[facility]    LYNX SLING   LAPAROSCOPIC SALPINGO  OOPHERECTOMY Bilateral 01/16/2021   Procedure: DIAGNOSTIC LAPAROSCOPY;  Surgeon: Richarda Overlie, MD;  Location: [facility];  Service: Gynecology;  Laterality: Bilateral;   LAPAROSCOPY Bilateral 01/16/2021   Procedure: EXPLORATORY LAPAROTOMY BILATERAL SALPINGO OOPHORECTOMY;  Surgeon: Richarda Overlie, MD;  Location: [facility];  Service: Gynecology;  Laterality: Bilateral;   TUBAL LIGATION     Patient Active Problem List   Diagnosis Date Noted   Suburethral sling present 04/25/2023   Arthritis of carpometacarpal [facility]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 01/16/2022   Ovarian cyst 01/16/2021   Chronic constipation 11/21/2020   CKD (chronic kidney disease) stage 3, GFR 30-59 ml/min (HCC) 11/21/2020   Herpes 11/21/2020   Hypokalemia 11/21/2020   Weakness 11/17/2020   Aspiration into airway    Fall    Dysphagia 06/14/2013   Atrophic vaginitis 06/14/2013   Heel pain 06/14/2013   Hypercholesteremia    Hypertension    GERD (gastroesophageal reflux disease)    Hypothyroidism    Renal insufficiency 06/19/2012    PCP: Donita Brooks, MD   REFERRING PROVIDER: Donita Brooks, MD   REFERRING DIAG:  M54.2 (ICD-10-CM) - Neck pain M50.30 (ICD-10-CM) - DDD (degenerative disc disease), cervical  THERAPY DIAG:  Neck tightness  Muscle weakness (generalized)  Neck pain  Rationale for Evaluation and Treatment: Rehabilitation  ONSET DATE: >1 year   SUBJECTIVE:                                                                                                                                                                                                         SUBJECTIVE STATEMENT: Pt reports her pain is 5/10 today mostly when she rotates her head either way.   Evaluation: Patient reports the pain goes up her neck and head leading to a headache. Also reports back pain after picking up her trash can but seems to be getting better. Neck pain is localized to neck and head.  Difficulty turning head L/R (worse to the R).  Hand dominance: Right  PERTINENT HISTORY:  Per MD note on 07/16/23, "For the last several months she has been dealing with neck pain. She states that it hurts to turn her head side-to-side. She denies any neuropathic pain radiating down the arms. She denies any weakness in her arms. She denies any numbness or tingling in her arms. Patient has CT scan of the neck in February of this year that did show degenerative disc disease between C3 and C5. There was no significant nerve impingement. The patient is unable to take NSAIDs due to her chronic kidney disease. Tylenol is not helping very much." PAIN:  Are you having pain? Yes: NPRS scale: 8/10 Pain location: neck while turning  Pain description: sharp, stabbing   Aggravating factors: turning  Relieving factors: stop turning   PRECAUTIONS: None  RED FLAGS: None     WEIGHT BEARING RESTRICTIONS: No  FALLS:  Has patient fallen in last 6 months? No  LIVING ENVIRONMENT: Lives with: lives alone Lives in: House/apartment Stairs: Yes: External: 4 steps; can reach both Has following equipment at home: Single point cane and Walker - 2 wheeled  OCCUPATION: retired   PLOF: Independent  PATIENT GOALS: to help me deal with the pain   OBJECTIVE:  Note: Objective measures were completed at Evaluation unless otherwise noted.  DIAGNOSTIC FINDINGS:  N/A  PATIENT SURVEYS:  NDI 24%  COGNITION: Overall cognitive status: Within functional limits for tasks assessed  SENSATION: WFL  POSTURE: rounded shoulders and forward head  PALPATION: Trigger points notable in B UT (R>L) Significant tightness in B UT, cervical paraspinals, and suboccipitals    CERVICAL ROM:   Active ROM A/PROM (%) eval  Flexion WFL  Extension WFL  Right lateral flexion 50%  Left lateral flexion 50%  Right rotation 50%  Left rotation 50%   (Blank rows = not tested)  UPPER EXTREMITY ROM:  B UE AROM WFL     UPPER EXTREMITY MMT:  MMT Right eval Left eval  Shoulder flexion 4-* 4-*  Shoulder extension    Shoulder abduction 4-* 4-*  Shoulder adduction    Shoulder internal rotation 4 4  Shoulder external rotation 4 4  Middle trapezius    Lower trapezius    Elbow flexion 4+ 4+  Elbow extension 4+ 4+   (Blank rows = not tested)  CERVICAL SPECIAL TESTS:  N/A     TODAY'S TREATMENT:                                                                                                                              DATE: 07/31/23 Goal and HEP review Seated: retraction cervical 10X5"  Scaular retraction 10X5"  Cervical excursions 5X each direction  UT stretch 3X20" each direction  Levator Scap stretch 3X20" each direction Seated manual STM to bil upper traps/scaps/levator   07/23/23  Supine UT stretch with ipsilateral GHJ overpressure (bilateral) 2 x 30 seconds each side    PATIENT EDUCATION:  Education details: HEP, POC, goals  Person educated: Patient Education method: Explanation, Demonstration, and Handouts Education comprehension: verbalized understanding, returned demonstration, and needs further education  HOME EXERCISE PROGRAM: Access Code: [email or web address] URL: [email or web address] Date: 07/23/2023 Prepared by: Maylon Peppers  Exercises - Seated Flexion Stretch  - 2-3 x daily - 5-7 x weekly - 10 reps - Seated Upper Trapezius Stretch  - 2-3 x daily - 5-7 x weekly - 3-5 reps - 10-30 second hold - Seated Levator Scapulae Stretch  - 2-3 x daily - 5-7 x weekly - 3-5 reps - 10-30 second hold  Date: 07/31/2023 Prepared by: Emeline Gins Exercises - Seated Cervical Retraction  - 2 x daily - 2 sets - 10 reps - 3" hold - Seated Scapular Retraction with External Rotation  - 2 x daily - 2 sets - 10 reps - 5 sec hold  ASSESSMENT:  CLINICAL IMPRESSION: Reviewed goals and POC moving forward.  Pt with questions regarding HEP and unable to complete without minimal cues for  form and hold times.  Began AROM in seated for all cervical directions as well as retraction for both scapular and cervical mm.  Updated HEP to include these. Pt also asked for local massage therapist recommendations and given a sheet of several in the area.  Manual completed to bil traps/levator mm this session in seated.  Large spasms bil upper traps that would not resolve, however pt reported some pain relief following.  Patient will benefit from skilled PT interventions to address listed impairments to improve cervical rotation with reduction in pain.   OBJECTIVE IMPAIRMENTS: decreased activity tolerance, decreased ROM, decreased strength, hypomobility, impaired flexibility, postural dysfunction, and pain.   ACTIVITY LIMITATIONS: carrying and lifting  PARTICIPATION LIMITATIONS: cleaning, driving, and yard work  PERSONAL FACTORS: Age and Behavior pattern  are also affecting patient's functional outcome.   REHAB POTENTIAL: Good  CLINICAL DECISION MAKING: Stable/uncomplicated  EVALUATION COMPLEXITY: Low   GOALS: Goals reviewed with patient? Yes  SHORT TERM GOALS: Target date: 08/12/2023  Patient will be independent in HEP to improve strength/mobility for better functional independence with ADLs. Baseline:  Goal status: INITIAL   LONG TERM GOALS: Target date: 09/02/2023   Patient will reduce Neck Disability Index score to <10% to demonstrate minimal disability with ADL's including improved sleeping tolerance, sitting tolerance, etc for better mobility at home and work. Baseline:  Goal status: INITIAL  2.  Patient will improve cervical rotation and sidebending ROM with reports of <2/10 pain.  Baseline:  Goal status: INITIAL  3.  Patient will improve B UE strength to 4+/5 or greater to improve ability to perform ADLs and other household work.  Baseline:  Goal status: INITIAL  4.  Patient will report <2/10 pain in neck on NRPS while driving turning L/R to improve ability to  complete driving and other activities involving cervical rotation.  Baseline:  Goal status: INITIAL   PLAN:  PT FREQUENCY: 1-2x/week  PT DURATION: 6 weeks  PLANNED INTERVENTIONS: 97164- PT Re-evaluation, 97110-Therapeutic exercises, 97530- Therapeutic activity, 97112- Neuromuscular re-education, 97535- Self Care, 16109- Manual therapy, 97014- Electrical stimulation (unattended), [phone]- Electrical stimulation (manual), Patient/Family education, Dry Needling, Joint mobilization, Joint manipulation, Spinal manipulation, Spinal mobilization, Cryotherapy, and Moist heat  PLAN FOR NEXT SESSION: Continue with cervical strengthening/ stretching.  Manual to cervical mm; attempt suboccipital if no relief and/or gentle manual traction.  Lurena Nida, PTA, DPT  07/31/2023, 2:52 PM

## 2023-07-31 NOTE — Progress Notes (Signed)
[facility]  Laurell Josephs  PROGRESS NOTE      Patient: Helen [name]   Date: 07/31/2023   MRN: [id number]     Helen [name] is a 77 y.o. female    Chief Complaint   Patient presents with    Nasal Congestion     Present for 3 weeks.    Cough     Persistent at night.    Shortness of Breath     Pt started to use nebulizer due to experiencing shortness of breath.       MEDICATIONS     Current Medications[1]    Allergies[2]    SUBJECTIVE     Chief Complaint   Patient presents with    Nasal Congestion     Present for 3 weeks.    Cough     Persistent at night.    Shortness of Breath     Pt started to use nebulizer due to experiencing shortness of breath.        HPI    URI x2 weeks with asthma exacerbation. No improvement since onset. Has needed to use albuterol nebs x2 which has helped.    States blood sugars are stable    ROS     Review of Systems   Constitutional:  Positive for fatigue. Negative for chills and fever.   HENT:  Positive for postnasal drip. Negative for congestion, ear pain, rhinorrhea and sore throat.    Respiratory:  Positive for cough (dry), chest tightness and shortness of breath. Negative for wheezing.    Cardiovascular:  Negative for chest pain.   Gastrointestinal:  Negative for diarrhea, nausea and vomiting.   Neurological:  Negative for dizziness and headaches.       The following portions of the patient's history were reviewed and updated as appropriate: Allergies, Current Medications, Past Family History, Past Medical history, Past social history, Past surgical history, and Problem List.    PHYSICAL EXAM     Vitals:    07/31/23 1321   BP: 128/71   BP Site: Left arm   Patient Position: Sitting   Cuff Size: Medium   Pulse: 91   Temp: 98.1 F (36.7 C)   TempSrc: Oral   SpO2: 98%   Weight: 72.2 kg (159 lb 3.2 oz)       Physical Exam  Vitals and nursing note reviewed.   Constitutional:       General: She is not in acute distress.     Appearance: Normal appearance. She is well-developed.   HENT:       Head: Normocephalic and atraumatic.      Right Ear: Tympanic membrane and ear canal normal.      Left Ear: Tympanic membrane and ear canal normal.      Nose: Nose normal. No nasal tenderness, mucosal edema or rhinorrhea.      Mouth/Throat:      Lips: Pink.      Mouth: Mucous membranes are moist.      Pharynx: Oropharynx is clear. Uvula midline.   Eyes:      General: Lids are normal.      Conjunctiva/sclera: Conjunctivae normal.   Cardiovascular:      Rate and Rhythm: Normal rate and regular rhythm.      Chest Wall: PMI is not displaced.      Heart sounds: Normal heart sounds.   Pulmonary:      Effort: Pulmonary effort is normal.      Breath sounds: Normal  breath sounds and air entry.   Lymphadenopathy:      Cervical: No cervical adenopathy.   Skin:     General: Skin is warm and dry.      Capillary Refill: Capillary refill takes less than 2 seconds.   Neurological:      Mental Status: She is alert and oriented to person, place, and time.   Psychiatric:         Mood and Affect: Mood normal.         Speech: Speech normal.         Behavior: Behavior normal.             ASSESSMENT/PLAN        1. Moderate persistent asthma with acute exacerbation        2. URI with cough and congestion  azithromycin (ZITHROMAX) 250 MG tablet        Acute, uncontrolled. Finish antibiotics completely. Take with food and increase probiotics to reduce risk of GI upset. Continue with inhalers/ nebs as previously prescribed. Holding off on oral corticosteroids today- pt well appearing, no acute distress, VS stable.  Cough suppressant PRN. Increase rest and fluids.     Reviewed med use and side effects. Reviewed s/s that would warrant further medical attention. Pt in agreement with plan and all questions answered.    Ddx: viral URI, pharyngitis, bronchitis, sinusitis, pneumonia, allergic rhinitis, RAD, PND, influenza         Risk & Benefits of the new medication(s) were explained to the patient (and family) who verbalized understanding &  agreed to the treatment plan. Patient (family) encouraged to contact me/clinical staff with any questions/concerns      Return if symptoms worsen or fail to improve.    Signed,  Myer Haff, FNP, FNP  07/31/2023         [1]   Current Outpatient Medications   Medication Sig Dispense Refill    albuterol (PROVENTIL) (2.5 MG/3ML) 0.083% nebulizer solution TAKE 3 MLS (2.5 MG TOTAL) BY NEBULIZATION EVERY 4 (FOUR) HOURS AS NEEDED FOR SHORTNESS OF BREATH OR WHEEZING 75 mL 1    albuterol sulfate HFA (PROVENTIL) 108 (90 Base) MCG/ACT inhaler INHALE 2 PUFFS INTO THE LUNGS EVERY 4 HOURS AS NEEDED FOR WHEEZING OR SHORTNESS OF BREATH. 6.7 each 2    amLODIPine (NORVASC) 10 MG tablet TAKE 1 TABLET (10 MG) BY MOUTH DAILY. 90 tablet 3    atorvastatin (LIPITOR) 40 MG tablet TAKE 1 TABLET BY MOUTH EVERY DAY 90 tablet 3    Blood Glucose Monitoring Suppl (ONE TOUCH ULTRA 2) W/DEVICE KIT One daily 1 each 0    Cholecalciferol (Vitamin D3) 2000 UNIT capsule Take 1 capsule (2,000 Units) by mouth daily 90 capsule 1    dexAMETHasone (DECADRON) 0.1 % ophthalmic solution Place 1 drop into both eyes      empagliflozin (Jardiance) 25 MG tablet Take 1 tablet (25 mg) by mouth every morning 90 tablet 1    EPINEPHrine (EPIPEN 2-PAK) 0.3 MG/0.3ML Solution Auto-injector injection Inject 0.3 mLs (0.3 mg) into the muscle once as needed (Anaphylaxis) 1 each 3    EPINEPHrine 0.3 MG/0.3ML auto-injector 1 DOSE IF NEEDED FOR ANAPHYLAXIS      ferrous sulfate 325 (65 FE) MG tablet Take 1 tablet (325 mg) by mouth every morning with breakfast      fluticasone (FLONASE) 50 MCG/ACT nasal spray 2 sprays by Nasal route daily      glucose blood (OneTouch Ultra) test strip 2 times daily  200 each 3    Lancets (onetouch ultrasoft) lancets Use to check blood sugars 2 times daily 200 each 3    lidocaine (Lidoderm) 5 % Place 1 patch onto the skin daily as needed (pain) Remove & Discard patch within 12 hours 30 patch 0    metFORMIN (GLUCOPHAGE-XR) 500 MG 24 hr tablet  TAKE 2 TABLETS (1,000 MG) BY MOUTH 2 (TWO) TIMES DAILY 360 tablet 0    montelukast (SINGULAIR) 10 MG tablet Take 1 tablet (10 mg) by mouth daily      NASONEX 50 MCG/ACT nasal spray 2 sprays by Nasal route daily as needed  5    pantoprazole (PROTONIX) 40 MG tablet TAKE 1 TABLET BY MOUTH EVERY DAY 90 tablet 1    Tradjenta 5 MG Tab TAKE 1 TABLET BY MOUTH EVERY DAY 90 tablet 1    valsartan-hydroCHLOROthiazide (DIOVAN-HCT) 160-12.5 MG per tablet TAKE 1 TABLET BY MOUTH EVERY DAY 90 tablet 1    azithromycin (ZITHROMAX) 250 MG tablet Two tabs PO day 1 and then one tab PO daily days 2-5 6 tablet 0    benzonatate (TESSALON) 100 MG capsule Take 1 capsule (100 mg) by mouth 3 (three) times daily as needed for Cough 30 capsule 0    fluticasone (FLOVENT HFA) 220 MCG/ACT inhaler INHALE 1 PUFF INTO THE LUNGS TWICE A DAY 36 each 1    glipiZIDE (GLUCOTROL) 10 MG tablet Take 2 tablets (20 mg) by mouth 2 (two) times daily before meals 360 tablet 1     No current facility-administered medications for this visit.   [2]   Allergies  Allergen Reactions    Aspirin      Break out in sweat and dizziness     Other Environmental     Peppers     Pollen Extract     Latex Itching and Rash

## 2023-07-31 NOTE — Progress Notes (Signed)
Have you seen any specialists/other providers since your last visit with us?    No      The patient was informed that the following HM items are still outstanding:   Health Maintenance Due   Topic Date Due    Advance Directive on File  Never done

## 2023-08-07 ENCOUNTER — Ambulatory Visit (HOSPITAL_COMMUNITY): Payer: Medicare Other | Attending: Family Medicine | Admitting: Physical Therapy

## 2023-08-07 DIAGNOSIS — M6281 Muscle weakness (generalized): Secondary | ICD-10-CM | POA: Insufficient documentation

## 2023-08-07 DIAGNOSIS — M542 Cervicalgia: Secondary | ICD-10-CM | POA: Diagnosis not present

## 2023-08-07 DIAGNOSIS — R29898 Other symptoms and signs involving the musculoskeletal system: Secondary | ICD-10-CM | POA: Insufficient documentation

## 2023-08-07 NOTE — Therapy (Signed)
OUTPATIENT PHYSICAL THERAPY TREATMENT   Patient Name: April Beck MRN: [id number] DOB:[birth year], 77 y.o., female Today's Date: 08/07/2023  END OF SESSION:  PT End of Session - 08/07/23 1344     Visit Number 3    Number of Visits 16    Date for PT Re-Evaluation 09/03/23    Progress Note Due on Visit 10    PT Start Time 1305    PT Stop Time 1345    PT Time Calculation (min) 40 min    Activity Tolerance Patient tolerated treatment well    Behavior During Therapy [facility] for tasks assessed/performed              Past Medical History:  Diagnosis Date   Anxiety    hx of panic attack   Arthritis    hands & knees   CKD (chronic kidney disease), stage III (HCC)    followed by pcp   Closed fracture of left distal radius 10/31/2021   Edema of both lower extremities    GERD (gastroesophageal reflux disease)    History of 2019 novel coronavirus disease (COVID-19) 09/17/2020   positive result in epic,  per pt very mild symtpoms that resolved   History of uterine fibroid    Hypertension    followed by pcp   Hypomagnesemia    Hypothyroidism    followed by pcp   IDA (iron deficiency anemia)    Osteopenia    Ovarian cyst    Pelvic pain    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, takes meloxicam   Wears contact lenses    Past Surgical History:  Procedure Laterality Date   ABDOMINAL HYSTERECTOMY  1982   CATARACT EXTRACTION Bilateral    CHOLECYSTECTOMY  06/20/2012   Procedure: LAPAROSCOPIC CHOLECYSTECTOMY;  Surgeon: Liz Malady, MD;  Location: [facility] OR;  Service: General;  Laterality: N/A;   COLONOSCOPY  last one 03-20-2019  dr Christella Hartigan   DIAGNOSTIC LAPAROSCOPY  yrs ago   HIP ARTHROPLASTY Right 11/13/2020   Procedure: ARTHROPLASTY BIPOLAR HIP (HEMIARTHROPLASTY);  Surgeon: Vickki Hearing, MD;  Location: [facility];  Service: Orthopedics;  Laterality: Right;   INCONTINENCE SURGERY  09-11-2005   dr Annabell Howells  @[facility]    LYNX SLING   LAPAROSCOPIC SALPINGO  OOPHERECTOMY Bilateral 01/16/2021   Procedure: DIAGNOSTIC LAPAROSCOPY;  Surgeon: Richarda Overlie, MD;  Location: [facility];  Service: Gynecology;  Laterality: Bilateral;   LAPAROSCOPY Bilateral 01/16/2021   Procedure: EXPLORATORY LAPAROTOMY BILATERAL SALPINGO OOPHORECTOMY;  Surgeon: Richarda Overlie, MD;  Location: [facility];  Service: Gynecology;  Laterality: Bilateral;   TUBAL LIGATION     Patient Active Problem List   Diagnosis Date Noted   Suburethral sling present 04/25/2023   Arthritis of carpometacarpal [facility]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 01/16/2022   Ovarian cyst 01/16/2021   Chronic constipation 11/21/2020   CKD (chronic kidney disease) stage 3, GFR 30-59 ml/min (HCC) 11/21/2020   Herpes 11/21/2020   Hypokalemia 11/21/2020   Weakness 11/17/2020   Aspiration into airway    Fall    Dysphagia 06/14/2013   Atrophic vaginitis 06/14/2013   Heel pain 06/14/2013   Hypercholesteremia    Hypertension    GERD (gastroesophageal reflux disease)    Hypothyroidism    Renal insufficiency 06/19/2012    PCP: Donita Brooks, MD   REFERRING PROVIDER: Donita Brooks, MD   REFERRING DIAG:  M54.2 (ICD-10-CM) - Neck pain M50.30 (ICD-10-CM) - DDD (degenerative disc disease), cervical  THERAPY DIAG:  Neck tightness  Muscle weakness (generalized)  Neck pain  Rationale for Evaluation and Treatment: Rehabilitation  ONSET DATE: >1 year   SUBJECTIVE:                                                                                                                                                                                                         SUBJECTIVE STATEMENT:Pt states that the left side feels better but the right is still painful   PERTINENT HISTORY:  Per MD note on 07/16/23, "For the last several months she has been dealing with neck pain. She states that it hurts to turn her head side-to-side. She denies any neuropathic pain radiating down the  arms. She denies any weakness in her arms. She denies any numbness or tingling in her arms. Patient has CT scan of the neck in February of this year that did show degenerative disc disease between C3 and C5. There was no significant nerve impingement. The patient is unable to take NSAIDs due to her chronic kidney disease. Tylenol is not helping very much." PAIN:  Are you having pain? Yes: NPRS scale: 7/10 Pain location: neck while turning  Pain description: sharp, stabbing   Aggravating factors: turning  Relieving factors: stop turning   PRECAUTIONS: None  RED FLAGS: None     WEIGHT BEARING RESTRICTIONS: No  FALLS:  Has patient fallen in last 6 months? No  LIVING ENVIRONMENT: Lives with: lives alone Lives in: House/apartment Stairs: Yes: External: 4 steps; can reach both Has following equipment at home: Single point cane and Walker - 2 wheeled  OCCUPATION: retired   PLOF: Independent  PATIENT GOALS: to help me deal with the pain   OBJECTIVE:  Note: Objective measures were completed at Evaluation unless otherwise noted.  DIAGNOSTIC FINDINGS:  N/A  PATIENT SURVEYS:  NDI 24%  COGNITION: Overall cognitive status: Within functional limits for tasks assessed  SENSATION: WFL  POSTURE: rounded shoulders and forward head  PALPATION: Trigger points notable in B UT (R>L) Significant tightness in B UT, cervical paraspinals, and suboccipitals    CERVICAL ROM:   Active ROM A/PROM (%) eval  Flexion WFL  Extension WFL  Right lateral flexion 50%  Left lateral flexion 50%  Right rotation 50%   Left rotation 50%   (Blank rows = not tested)  UPPER EXTREMITY ROM:  B UE AROM WFL    UPPER EXTREMITY MMT:  MMT Right eval Left eval  Shoulder flexion 4-* 4-*  Shoulder extension    Shoulder abduction 4-* 4-*  Shoulder adduction    Shoulder  internal rotation 4 4  Shoulder external rotation 4 4  Middle trapezius    Lower trapezius    Elbow flexion 4+ 4+  Elbow  extension 4+ 4+   (Blank rows = not tested)  CERVICAL SPECIAL TESTS:  N/A     TODAY'S TREATMENT:                                                                                                                              DATE: 08/07/23 Supine manual with efflurage/petrissage to decrease spasms          retraction cervical 10X5"  Scaular retraction 10X5"  Cervical retraction 10   Passive UT stretch 3X20" each direction  Jt mobs          Cervical rotation  Sitting: cervical excursions x 3 Scapular retraction x 5 Upper body bike backward x 3 minutes   PATIENT EDUCATION:  Education details: HEP, POC, goals  Person educated: Patient Education method: Explanation, Demonstration, and Handouts Education comprehension: verbalized understanding, returned demonstration, and needs further education  HOME EXERCISE PROGRAM: Access Code: [email or web address] URL: [email or web address] Date: 07/23/2023 Prepared by: Maylon Peppers  Exercises - Seated Flexion Stretch  - 2-3 x daily - 5-7 x weekly - 10 reps - Seated Upper Trapezius Stretch  - 2-3 x daily - 5-7 x weekly - 3-5 reps - 10-30 second hold - Seated Levator Scapulae Stretch  - 2-3 x daily - 5-7 x weekly - 3-5 reps - 10-30 second hold  Date: 07/31/2023 Prepared by: Emeline Gins Exercises - Seated Cervical Retraction  - 2 x daily - 2 sets - 10 reps - 3" hold - Seated Scapular Retraction with External Rotation  - 2 x daily - 2 sets - 10 reps - 5 sec hold         12 /4/24           - Supine Isometric Neck Rotation  - 1 x daily - 7 x weekly - 1 sets - 10 reps - 3-5 hold - Supine Head Nod with Deep Neck Flexor Activation  - 1 x daily - 7 x weekly - 1 sets - 10 reps - 3-5 hold ASSESSMENT:  CLINICAL IMPRESSION: Began cervical strengthening in supine for all cervical directions as well as retraction for both scapular and cervical mm.   spasms noted in bil upper traps that would not resolve on rt but were decreased following session.   Patient will benefit from skilled PT interventions to address listed impairments to improve cervical rotation with reduction in pain.   OBJECTIVE IMPAIRMENTS: decreased activity tolerance, decreased ROM, decreased strength, hypomobility, impaired flexibility, postural dysfunction, and pain.   ACTIVITY LIMITATIONS: carrying and lifting  PARTICIPATION LIMITATIONS: cleaning, driving, and yard work  PERSONAL FACTORS: Age and Behavior pattern are also affecting patient's functional outcome.   REHAB POTENTIAL: Good  CLINICAL DECISION MAKING: Stable/uncomplicated  EVALUATION COMPLEXITY: Low   GOALS: Goals reviewed with patient? Yes  SHORT TERM  GOALS: Target date: 08/12/2023  Patient will be independent in HEP to improve strength/mobility for better functional independence with ADLs. Baseline:  Goal status: on-going   LONG TERM GOALS: Target date: 09/02/2023   Patient will reduce Neck Disability Index score to <10% to demonstrate minimal disability with ADL's including improved sleeping tolerance, sitting tolerance, etc for better mobility at home and work. Baseline:  Goal status: on-going  2.  Patient will improve cervical rotation and sidebending ROM with reports of <2/10 pain.  Baseline:  Goal status: on-going  3.  Patient will improve B UE strength to 4+/5 or greater to improve ability to perform ADLs and other household work.  Baseline:  Goal status:on-going  4.  Patient will report <2/10 pain in neck on NRPS while driving turning L/R to improve ability to complete driving and other activities involving cervical rotation.  Baseline:  Goal status: on-going   PLAN:  PT FREQUENCY: 1-2x/week  PT DURATION: 6 weeks  PLANNED INTERVENTIONS: 97164- PT Re-evaluation, 97110-Therapeutic exercises, 97530- Therapeutic activity, 97112- Neuromuscular re-education, 97535- Self Care, 29528- Manual therapy, 97014- Electrical stimulation (unattended), [phone]- Electrical stimulation  (manual), Patient/Family education, Dry Needling, Joint mobilization, Joint manipulation, Spinal manipulation, Spinal mobilization, Cryotherapy, and Moist heat  PLAN FOR NEXT SESSION: Continue with cervical strengthening/ stretching.  Manual to cervical mm; attempt suboccipital if no relief and/or gentle manual traction. Virgina Organ, PT CLT [phone]   08/07/2023, 1:46 PM

## 2023-08-14 ENCOUNTER — Ambulatory Visit (HOSPITAL_COMMUNITY): Payer: Medicare Other | Admitting: Physical Therapy

## 2023-08-14 DIAGNOSIS — M6281 Muscle weakness (generalized): Secondary | ICD-10-CM

## 2023-08-14 DIAGNOSIS — M542 Cervicalgia: Secondary | ICD-10-CM | POA: Diagnosis not present

## 2023-08-14 DIAGNOSIS — R29898 Other symptoms and signs involving the musculoskeletal system: Secondary | ICD-10-CM | POA: Diagnosis not present

## 2023-08-14 NOTE — Therapy (Signed)
OUTPATIENT PHYSICAL THERAPY TREATMENT   Patient Name: April Beck MRN: [id number] DOB:December 07, 1945, 77 y.o., female Today's Date: 08/14/2023  END OF SESSION:  PT End of Session - 08/14/23 1136     Visit Number 4    Number of Visits 16    Date for PT Re-Evaluation 09/03/23    Progress Note Due on Visit 10    PT Start Time 1055    PT Stop Time 1137    PT Time Calculation (min) 42 min    Activity Tolerance Patient tolerated treatment well    Behavior During Therapy WFL for tasks assessed/performed               Past Medical History:  Diagnosis Date   Anxiety    hx of panic attack   Arthritis    hands & knees   CKD (chronic kidney disease), stage III (HCC)    followed by pcp   Closed fracture of left distal radius 10/31/2021   Edema of both lower extremities    GERD (gastroesophageal reflux disease)    History of 2019 novel coronavirus disease (COVID-19) 09/17/2020   positive result in epic,  per pt very mild symtpoms that resolved   History of uterine fibroid    Hypertension    followed by pcp   Hypomagnesemia    Hypothyroidism    followed by pcp   IDA (iron deficiency anemia)    Osteopenia    Ovarian cyst    Pelvic pain    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, takes meloxicam   Wears contact lenses    Past Surgical History:  Procedure Laterality Date   ABDOMINAL HYSTERECTOMY  1982   CATARACT EXTRACTION Bilateral    CHOLECYSTECTOMY  06/20/2012   Procedure: LAPAROSCOPIC CHOLECYSTECTOMY;  Surgeon: Liz Malady, MD;  Location: [facility];  Service: General;  Laterality: N/A;   COLONOSCOPY  last one 03-20-2019  dr Christella Hartigan   DIAGNOSTIC LAPAROSCOPY  yrs ago   HIP ARTHROPLASTY Right 11/13/2020   Procedure: ARTHROPLASTY BIPOLAR HIP (HEMIARTHROPLASTY);  Surgeon: Vickki Hearing, MD;  Location: [facility];  Service: Orthopedics;  Laterality: Right;   INCONTINENCE SURGERY  09-11-2005   dr Annabell Howells  @[facility]    LYNX SLING   LAPAROSCOPIC SALPINGO  OOPHERECTOMY Bilateral 01/16/2021   Procedure: DIAGNOSTIC LAPAROSCOPY;  Surgeon: Richarda Overlie, MD;  Location: [facility];  Service: Gynecology;  Laterality: Bilateral;   LAPAROSCOPY Bilateral 01/16/2021   Procedure: EXPLORATORY LAPAROTOMY BILATERAL SALPINGO OOPHORECTOMY;  Surgeon: Richarda Overlie, MD;  Location: [facility];  Service: Gynecology;  Laterality: Bilateral;   TUBAL LIGATION     Patient Active Problem List   Diagnosis Date Noted   Suburethral sling present 04/25/2023   Arthritis of carpometacarpal [facility]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 01/16/2022   Ovarian cyst 01/16/2021   Chronic constipation 11/21/2020   CKD (chronic kidney disease) stage 3, GFR 30-59 ml/min (HCC) 11/21/2020   Herpes 11/21/2020   Hypokalemia 11/21/2020   Weakness 11/17/2020   Aspiration into airway    Fall    Dysphagia 06/14/2013   Atrophic vaginitis 06/14/2013   Heel pain 06/14/2013   Hypercholesteremia    Hypertension    GERD (gastroesophageal reflux disease)    Hypothyroidism    Renal insufficiency 06/19/2012    PCP: Donita Brooks, MD   REFERRING PROVIDER: Donita Brooks, MD   REFERRING DIAG:  M54.2 (ICD-10-CM) - Neck pain M50.30 (ICD-10-CM) - DDD (degenerative disc disease), cervical  THERAPY  DIAG:  Neck tightness  Muscle weakness (generalized)  Neck pain  Rationale for Evaluation and Treatment: Rehabilitation  ONSET DATE: >1 year   SUBJECTIVE:                                                                                                                                                                                                         SUBJECTIVE STATEMENT:Pt states that the weather is bothering her neck.  She has been doing the exercises that were given to her  PERTINENT HISTORY:  Per MD note on 07/16/23, "For the last several months she has been dealing with neck pain. She states that it hurts to turn her head side-to-side. She denies any  neuropathic pain radiating down the arms. She denies any weakness in her arms. She denies any numbness or tingling in her arms. Patient has CT scan of the neck in February of this year that did show degenerative disc disease between C3 and C5. There was no significant nerve impingement. The patient is unable to take NSAIDs due to her chronic kidney disease. Tylenol is not helping very much." PAIN:  Are you having pain? Yes: NPRS scale: 7/10 Pain location: neck while turning  Pain description: sharp, stabbing   Aggravating factors: turning  Relieving factors: stop turning   PRECAUTIONS: None  RED FLAGS: None     WEIGHT BEARING RESTRICTIONS: No  FALLS:  Has patient fallen in last 6 months? No  LIVING ENVIRONMENT: Lives with: lives alone Lives in: House/apartment Stairs: Yes: External: 4 steps; can reach both Has following equipment at home: Single point cane and Walker - 2 wheeled  OCCUPATION: retired   PLOF: Independent  PATIENT GOALS: to help me deal with the pain   OBJECTIVE:  Note: Objective measures were completed at Evaluation unless otherwise noted.  DIAGNOSTIC FINDINGS:  N/A  PATIENT SURVEYS:  NDI 24%  COGNITION: Overall cognitive status: Within functional limits for tasks assessed  SENSATION: WFL  POSTURE: rounded shoulders and forward head  PALPATION: Trigger points notable in B UT (R>L) Significant tightness in B UT, cervical paraspinals, and suboccipitals    CERVICAL ROM:   Active ROM A/PROM (%) eval  Flexion WFL  Extension WFL  Right lateral flexion 50%  Left lateral flexion 50%  Right rotation 50%   Left rotation 50%   (Blank rows = not tested)  UPPER EXTREMITY ROM:  B UE AROM WFL    UPPER EXTREMITY MMT:  MMT Right eval Left eval  Shoulder flexion 4-* 4-*  Shoulder extension    Shoulder abduction 4-*  4-*  Shoulder adduction    Shoulder internal rotation 4 4  Shoulder external rotation 4 4  Middle trapezius    Lower trapezius     Elbow flexion 4+ 4+  Elbow extension 4+ 4+   (Blank rows = not tested)  CERVICAL SPECIAL TESTS:  N/A     TODAY'S TREATMENT:                                                                                                                              DATE:08/14/23 Supine manual with efflurage/petrissage to decrease spasms          retraction cervical 10X5"  Scaular retraction 10X5"  Cervical retraction 10   Passive UT stretch 3X20" each direction  Jt mobs          Cervical rotation  Supine decompression exercises with theraband (Red):  The Overhead, The side pull, Sash and ER all x 5 Sitting shoulder rolls retro x 10             Cervical and scapular retraction together x 10 08/07/23 Supine manual with efflurage/petrissage to decrease spasms          retraction cervical 10X5"  Scaular retraction 10X5"  Cervical retraction 10   Passive UT stretch 3X20" each direction  Jt mobs          Cervical rotation  Sitting: cervical excursions x 3 Scapular retraction x 5 Upper body bike backward x 3 minutes   PATIENT EDUCATION:  Education details: HEP, POC, goals  Person educated: Patient Education method: Explanation, Demonstration, and Handouts Education comprehension: verbalized understanding, returned demonstration, and needs further education  HOME EXERCISE PROGRAM: Access Code: [email or web address] URL: [email or web address] Date: 07/23/2023 Prepared by: Maylon Peppers  Exercises - Seated Flexion Stretch  - 2-3 x daily - 5-7 x weekly - 10 reps - Seated Upper Trapezius Stretch  - 2-3 x daily - 5-7 x weekly - 3-5 reps - 10-30 second hold - Seated Levator Scapulae Stretch  - 2-3 x daily - 5-7 x weekly - 3-5 reps - 10-30 second hold  Date: 07/31/2023 Prepared by: Emeline Gins Exercises - Seated Cervical Retraction  - 2 x daily - 2 sets - 10 reps - 3" hold - Seated Scapular Retraction with External Rotation  - 2 x daily - 2 sets - 10 reps - 5 sec hold         12 /4/24            - Supine Isometric Neck Rotation  - 1 x daily - 7 x weekly - 1 sets - 10 reps - 3-5 hold - Supine Head Nod with Deep Neck Flexor Activation  - 1 x daily - 7 x weekly - 1 sets - 10 reps - 3-5 hold ASSESSMENT:  CLINICAL IMPRESSION: Continued with cervical stabilization and strengthening. Significant decreased trap spasms. Added decompression exercises with theraband to HEP.  Patient will benefit from skilled PT interventions to address listed  impairments to improve cervical rotation with reduction in pain.   OBJECTIVE IMPAIRMENTS: decreased activity tolerance, decreased ROM, decreased strength, hypomobility, impaired flexibility, postural dysfunction, and pain.   ACTIVITY LIMITATIONS: carrying and lifting  PARTICIPATION LIMITATIONS: cleaning, driving, and yard work  PERSONAL FACTORS: Age and Behavior pattern are also affecting patient's functional outcome.   REHAB POTENTIAL: Good  CLINICAL DECISION MAKING: Stable/uncomplicated  EVALUATION COMPLEXITY: Low   GOALS: Goals reviewed with patient? Yes  SHORT TERM GOALS: Target date: 08/12/2023  Patient will be independent in HEP to improve strength/mobility for better functional independence with ADLs. Baseline:  Goal status: on-going   LONG TERM GOALS: Target date: 09/02/2023   Patient will reduce Neck Disability Index score to <10% to demonstrate minimal disability with ADL's including improved sleeping tolerance, sitting tolerance, etc for better mobility at home and work. Baseline:  Goal status: on-going  2.  Patient will improve cervical rotation and sidebending ROM with reports of <2/10 pain.  Baseline:  Goal status: on-going  3.  Patient will improve B UE strength to 4+/5 or greater to improve ability to perform ADLs and other household work.  Baseline:  Goal status:on-going  4.  Patient will report <2/10 pain in neck on NRPS while driving turning L/R to improve ability to complete driving and other activities  involving cervical rotation.  Baseline:  Goal status: on-going   PLAN:  PT FREQUENCY: 1-2x/week  PT DURATION: 6 weeks  PLANNED INTERVENTIONS: 97164- PT Re-evaluation, 97110-Therapeutic exercises, 97530- Therapeutic activity, 97112- Neuromuscular re-education, 97535- Self Care, 81191- Manual therapy, 97014- Electrical stimulation (unattended), [phone]- Electrical stimulation (manual), Patient/Family education, Dry Needling, Joint mobilization, Joint manipulation, Spinal manipulation, Spinal mobilization, Cryotherapy, and Moist heat  PLAN FOR NEXT SESSION: Continue with cervical strengthening/ stretching.  Add theraband postural exercise next treatment  Virgina Organ, PT CLT [phone]   08/14/2023, 11:37 AM

## 2023-08-21 ENCOUNTER — Ambulatory Visit (HOSPITAL_COMMUNITY): Payer: Medicare Other | Admitting: Physical Therapy

## 2023-08-21 DIAGNOSIS — R29898 Other symptoms and signs involving the musculoskeletal system: Secondary | ICD-10-CM | POA: Diagnosis not present

## 2023-08-21 DIAGNOSIS — M6281 Muscle weakness (generalized): Secondary | ICD-10-CM | POA: Diagnosis not present

## 2023-08-21 DIAGNOSIS — M542 Cervicalgia: Secondary | ICD-10-CM | POA: Diagnosis not present

## 2023-08-21 NOTE — Therapy (Signed)
OUTPATIENT PHYSICAL THERAPY TREATMENT   Patient Name: April Beck MRN: [id number] DOB:[birth year], 77 y.o., female Today's Date: 08/21/2023  END OF SESSION:  PT End of Session - 08/21/23 1341     Visit Number 5    Number of Visits 16    Date for PT Re-Evaluation 09/03/23    Progress Note Due on Visit 10    PT Start Time 1300    PT Stop Time 1341    PT Time Calculation (min) 41 min    Activity Tolerance Patient tolerated treatment well    Behavior During Therapy WFL for tasks assessed/performed                Past Medical History:  Diagnosis Date   Anxiety    hx of panic attack   Arthritis    hands & knees   CKD (chronic kidney disease), stage III (HCC)    followed by pcp   Closed fracture of left distal radius 10/31/2021   Edema of both lower extremities    GERD (gastroesophageal reflux disease)    History of 2019 novel coronavirus disease (COVID-19) 09/17/2020   positive result in epic,  per pt very mild symtpoms that resolved   History of uterine fibroid    Hypertension    followed by pcp   Hypomagnesemia    Hypothyroidism    followed by pcp   IDA (iron deficiency anemia)    Osteopenia    Ovarian cyst    Pelvic pain    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, takes meloxicam   Wears contact lenses    Past Surgical History:  Procedure Laterality Date   ABDOMINAL HYSTERECTOMY  1982   CATARACT EXTRACTION Bilateral    CHOLECYSTECTOMY  06/20/2012   Procedure: LAPAROSCOPIC CHOLECYSTECTOMY;  Surgeon: Liz Malady, MD;  Location: [facility];  Service: General;  Laterality: N/A;   COLONOSCOPY  last one 03-20-2019  dr Christella Hartigan   DIAGNOSTIC LAPAROSCOPY  yrs ago   HIP ARTHROPLASTY Right 11/13/2020   Procedure: ARTHROPLASTY BIPOLAR HIP (HEMIARTHROPLASTY);  Surgeon: Vickki Hearing, MD;  Location: [facility];  Service: Orthopedics;  Laterality: Right;   INCONTINENCE SURGERY  09-11-2005   dr Annabell Howells  @[facility]    LYNX SLING   LAPAROSCOPIC SALPINGO  OOPHERECTOMY Bilateral 01/16/2021   Procedure: DIAGNOSTIC LAPAROSCOPY;  Surgeon: Richarda Overlie, MD;  Location: [facility];  Service: Gynecology;  Laterality: Bilateral;   LAPAROSCOPY Bilateral 01/16/2021   Procedure: EXPLORATORY LAPAROTOMY BILATERAL SALPINGO OOPHORECTOMY;  Surgeon: Richarda Overlie, MD;  Location: [facility];  Service: Gynecology;  Laterality: Bilateral;   TUBAL LIGATION     Patient Active Problem List   Diagnosis Date Noted   Suburethral sling present 04/25/2023   Arthritis of carpometacarpal [facility]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 01/16/2022   Ovarian cyst 01/16/2021   Chronic constipation 11/21/2020   CKD (chronic kidney disease) stage 3, GFR 30-59 ml/min (HCC) 11/21/2020   Herpes 11/21/2020   Hypokalemia 11/21/2020   Weakness 11/17/2020   Aspiration into airway    Fall    Dysphagia 06/14/2013   Atrophic vaginitis 06/14/2013   Heel pain 06/14/2013   Hypercholesteremia    Hypertension    GERD (gastroesophageal reflux disease)    Hypothyroidism    Renal insufficiency 06/19/2012    PCP: Donita Brooks, MD   REFERRING PROVIDER: Donita Brooks, MD   REFERRING DIAG:  M54.2 (ICD-10-CM) - Neck pain M50.30 (ICD-10-CM) - DDD (degenerative disc disease), cervical  THERAPY DIAG:  Neck tightness  Muscle weakness (generalized)  Neck pain  Rationale for Evaluation and Treatment: Rehabilitation  ONSET DATE: >1 year   SUBJECTIVE:                                                                                                                                                                                                         SUBJECTIVE STATEMENT:PT states she mainly hurts on her Rt side. PERTINENT HISTORY:  Per MD note on 07/16/23, "For the last several months she has been dealing with neck pain. She states that it hurts to turn her head side-to-side. She denies any neuropathic pain radiating down the arms. She denies any weakness in  her arms. She denies any numbness or tingling in her arms. Patient has CT scan of the neck in February of this year that did show degenerative disc disease between C3 and C5. There was no significant nerve impingement. The patient is unable to take NSAIDs due to her chronic kidney disease. Tylenol is not helping very much." PAIN:  Are you having pain? Yes: NPRS scale: 5/10 Pain location: neck while turning  Pain description: sharp, stabbing   Aggravating factors: turning  Relieving factors: stop turning   PRECAUTIONS: None  RED FLAGS: None     WEIGHT BEARING RESTRICTIONS: No  FALLS:  Has patient fallen in last 6 months? No  LIVING ENVIRONMENT: Lives with: lives alone Lives in: House/apartment Stairs: Yes: External: 4 steps; can reach both Has following equipment at home: Single point cane and Walker - 2 wheeled  OCCUPATION: retired   PLOF: Independent  PATIENT GOALS: to help me deal with the pain   OBJECTIVE:  Note: Objective measures were completed at Evaluation unless otherwise noted.  DIAGNOSTIC FINDINGS:  N/A  PATIENT SURVEYS:  NDI 24%  COGNITION: Overall cognitive status: Within functional limits for tasks assessed  SENSATION: WFL  POSTURE: rounded shoulders and forward head  PALPATION: Trigger points notable in B UT (R>L) Significant tightness in B UT, cervical paraspinals, and suboccipitals    CERVICAL ROM:   Active ROM A/PROM (%) eval  Flexion WFL  Extension WFL  Right lateral flexion 50%  Left lateral flexion 50%  Right rotation 50%   Left rotation 50%   (Blank rows = not tested)  UPPER EXTREMITY ROM:  B UE AROM WFL    UPPER EXTREMITY MMT:  MMT Right eval Left eval  Shoulder flexion 4-* 4-*  Shoulder extension    Shoulder abduction 4-* 4-*  Shoulder adduction    Shoulder internal rotation 4 4  Shoulder external rotation 4 4  Middle trapezius    Lower trapezius    Elbow flexion 4+ 4+  Elbow extension 4+ 4+   (Blank rows = not  tested)  CERVICAL SPECIAL TESTS:  N/A     TODAY'S TREATMENT:                                                                                                                              DATE: 08/21/23 Standing postural exercises with red theraband: Scapular retraction x 10 Rows x 10 Shoulder extension B x 10 Sitting: Cervical and thoracic excursions x 3 each Supine: Cervical/scapular retraction x 10 Cervical rotation x 5 B Manual to include traction, jt mobs to improve ROM and efflurage/petrissage for pain relief.   08/14/23 Supine manual with efflurage/petrissage to decrease spasms          retraction cervical 10X5"  Scaular retraction 10X5"  Cervical retraction 10   Passive UT stretch 3X20" each direction  Jt mobs          Cervical rotation  Supine decompression exercises with theraband (Red):  The Overhead, The side pull, Sash and ER all x 5 Sitting shoulder rolls retro x 10             Cervical and scapular retraction together x 10 08/07/23 Supine manual with efflurage/petrissage to decrease spasms          retraction cervical 10X5"  Scaular retraction 10X5"  Cervical retraction 10   Passive UT stretch 3X20" each direction  Jt mobs          Cervical rotation  Sitting: cervical excursions x 3 Scapular retraction x 5 Upper body bike backward x 3 minutes   PATIENT EDUCATION:  Education details: HEP, POC, goals  Person educated: Patient Education method: Explanation, Demonstration, and Handouts Education comprehension: verbalized understanding, returned demonstration, and needs further education  HOME EXERCISE PROGRAM: Access Code: [email or web address] URL: [email or web address] Date: 07/23/2023 Prepared by: Maylon Peppers  Exercises - Seated Flexion Stretch  - 2-3 x daily - 5-7 x weekly - 10 reps - Seated Upper Trapezius Stretch  - 2-3 x daily - 5-7 x weekly - 3-5 reps - 10-30 second hold - Seated Levator Scapulae Stretch  - 2-3 x daily - 5-7 x weekly - 3-5  reps - 10-30 second hold  Date: 07/31/2023 Prepared by: Emeline Gins Exercises - Seated Cervical Retraction  - 2 x daily - 2 sets - 10 reps - 3" hold - Seated Scapular Retraction with External Rotation  - 2 x daily - 2 sets - 10 reps - 5 sec hold         12 /4/24           - Supine Isometric Neck Rotation  - 1 x daily - 7 x weekly - 1 sets - 10 reps - 3-5 hold - Supine Head Nod with Deep Neck Flexor Activation  - 1 x daily - 7 x weekly -  1 sets - 10 reps - 3-5 hold ASSESSMENT:  CLINICAL IMPRESSION: Added standing postural exercises.  Noted moderate spasm in Lt trap, mild in Rt but highest pain is in pt Rt side.  Pt will  Continue to benefit from skilled PT interventions to address listed impairments to improve cervical rotation with reduction in pain.   OBJECTIVE IMPAIRMENTS: decreased activity tolerance, decreased ROM, decreased strength, hypomobility, impaired flexibility, postural dysfunction, and pain.   ACTIVITY LIMITATIONS: carrying and lifting  PARTICIPATION LIMITATIONS: cleaning, driving, and yard work  PERSONAL FACTORS: Age and Behavior pattern are also affecting patient's functional outcome.   REHAB POTENTIAL: Good  CLINICAL DECISION MAKING: Stable/uncomplicated  EVALUATION COMPLEXITY: Low   GOALS: Goals reviewed with patient? Yes  SHORT TERM GOALS: Target date: 08/12/2023  Patient will be independent in HEP to improve strength/mobility for better functional independence with ADLs. Baseline:  Goal status: on-going   LONG TERM GOALS: Target date: 09/02/2023   Patient will reduce Neck Disability Index score to <10% to demonstrate minimal disability with ADL's including improved sleeping tolerance, sitting tolerance, etc for better mobility at home and work. Baseline:  Goal status: on-going  2.  Patient will improve cervical rotation and sidebending ROM with reports of <2/10 pain.  Baseline:  Goal status: on-going  3.  Patient will improve B UE strength to 4+/5  or greater to improve ability to perform ADLs and other household work.  Baseline:  Goal status:on-going  4.  Patient will report <2/10 pain in neck on NRPS while driving turning L/R to improve ability to complete driving and other activities involving cervical rotation.  Baseline:  Goal status: on-going   PLAN:  PT FREQUENCY: 1-2x/week  PT DURATION: 6 weeks  PLANNED INTERVENTIONS: 97164- PT Re-evaluation, 97110-Therapeutic exercises, 97530- Therapeutic activity, 97112- Neuromuscular re-education, 97535- Self Care, 16109- Manual therapy, 97014- Electrical stimulation (unattended), [phone]- Electrical stimulation (manual), Patient/Family education, Dry Needling, Joint mobilization, Joint manipulation, Spinal manipulation, Spinal mobilization, Cryotherapy, and Moist heat  PLAN FOR NEXT SESSION: Continue with cervical strengthening/ stretching.  Add theraband postural exercise next treatment  Virgina Organ, PT CLT [phone]   08/21/2023, 1:41 PM

## 2023-08-23 ENCOUNTER — Ambulatory Visit (INDEPENDENT_AMBULATORY_CARE_PROVIDER_SITE_OTHER): Payer: Medicare Other

## 2023-08-23 DIAGNOSIS — E119 Type 2 diabetes mellitus without complications: Secondary | ICD-10-CM

## 2023-08-23 DIAGNOSIS — I10 Essential (primary) hypertension: Secondary | ICD-10-CM

## 2023-08-26 ENCOUNTER — Other Ambulatory Visit (INDEPENDENT_AMBULATORY_CARE_PROVIDER_SITE_OTHER): Payer: Self-pay | Admitting: Family

## 2023-08-26 DIAGNOSIS — J4541 Moderate persistent asthma with (acute) exacerbation: Secondary | ICD-10-CM

## 2023-08-27 ENCOUNTER — Telehealth: Payer: Medicare Other | Admitting: Physician Assistant

## 2023-08-27 DIAGNOSIS — R42 Dizziness and giddiness: Secondary | ICD-10-CM | POA: Diagnosis not present

## 2023-08-27 MED ORDER — MECLIZINE HCL 25 MG PO TABS: 25.0000 mg | ORAL_TABLET | Freq: Three times a day (TID) | ORAL | 0 refills | Status: DC | PRN

## 2023-08-27 NOTE — Progress Notes (Signed)
Virtual Visit Consent   April Beck, you are scheduled for a virtual visit with a [facility] provider today. Just as with appointments in the office, your consent must be obtained to participate. Your consent will be active for this visit and any virtual visit you may have with one of our providers in the next 365 days. If you have a MyChart account, a copy of this consent can be sent to you electronically.  As this is a virtual visit, video technology does not allow for your provider to perform a traditional examination. This may limit your provider's ability to fully assess your condition. If your provider identifies any concerns that need to be evaluated in person or the need to arrange testing (such as labs, EKG, etc.), we will make arrangements to do so. Although advances in technology are sophisticated, we cannot ensure that it will always work on either your end or our end. If the connection with a video visit is poor, the visit may have to be switched to a telephone visit. With either a video or telephone visit, we are not always able to ensure that we have a secure connection.  By engaging in this virtual visit, you consent to the provision of healthcare and authorize for your insurance to be billed (if applicable) for the services provided during this visit. Depending on your insurance coverage, you may receive a charge related to this service.  I need to obtain your verbal consent now. Are you willing to proceed with your visit today? April Beck has provided verbal consent on 08/27/2023 for a virtual visit (video or telephone). Piedad Climes, [place]  Date: 08/27/2023 5:36 PM  Virtual Visit via Video Note   I, Piedad Climes, connected with  April Beck  ([id number], 11/02/1945) on 08/27/23 at  5:15 PM EST by a video-enabled telemedicine application and verified that I am speaking with the correct person using two identifiers.  Location: Patient: Virtual Visit  Location Patient: Home Provider: Virtual Visit Location Provider: Home Office   I discussed the limitations of evaluation and management by telemedicine and the availability of in person appointments. The patient expressed understanding and agreed to proceed.    History of Present Illness: April Beck is a 77 y.o. who identifies as a female who was assigned female at birth, and is being seen today for dizziness. Notes symptoms starting overnight with positional lightheadedness/dizziness and nausea. Denies any head trauma or injury. Notes it with getting up and walking around. Denies chest pain or SOB.  Denies URI symptoms. Occasional popping of R ear.  Denies fevers, chills, diarrhea. Denies recent travel or change in altitude.  Denies change in diet.  Has been hydrating well.   BP Readings from Last 3 Encounters:  04/25/23 (!) 147/81  04/17/23 (!) 172/81  04/08/23 118/70   Endorses taking her BP medications as directed. Notes she has had this issue in the past but has been some time. Was told it was inner ear.   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 01/16/2022   Ovarian cyst 01/16/2021   Chronic constipation 11/21/2020   CKD (chronic kidney disease) stage 3, GFR 30-59 ml/min (HCC) 11/21/2020   Herpes 11/21/2020   Hypokalemia 11/21/2020   Weakness 11/17/2020   Aspiration into airway    Fall    Dysphagia 06/14/2013  Atrophic vaginitis 06/14/2013   Heel pain 06/14/2013   Hypercholesteremia    Hypertension    GERD (gastroesophageal reflux disease)    Hypothyroidism    Renal insufficiency 06/19/2012    Allergies: No Known Allergies Medications:  Current Outpatient Medications:    meclizine (ANTIVERT) 25 MG tablet, Take 1 tablet (25 mg total) by mouth 3 (three) times daily as needed for dizziness., Disp: 30 tablet, Rfl: 0   acetaminophen  (TYLENOL) 325 MG tablet, Take 2 tablets (650 mg total) by mouth every 6 (six) hours as needed for pain. (Patient taking differently: Take 650 mg by mouth every 6 (six) hours as needed for pain. Arthritis formula per pt), Disp: , Rfl:    acyclovir (ZOVIRAX) 400 MG tablet, TAKE 1 TABLET EVERY DAY, Disp: 90 tablet, Rfl: 0   atorvastatin (LIPITOR) 40 MG tablet, TAKE 1 TABLET EVERY DAY, Disp: 90 tablet, Rfl: 0   calcium-vitamin D (OSCAL WITH D) 250-125 MG-UNIT tablet, Take 1 tablet by mouth daily., Disp: , Rfl:    citalopram (CELEXA) 10 MG tablet, TAKE 1 TABLET EVERY DAY, Disp: 90 tablet, Rfl: 0   cyclobenzaprine (FLEXERIL) 5 MG tablet, Take 1 tablet (5 mg total) by mouth 3 (three) times daily as needed for muscle spasms., Disp: 30 tablet, Rfl: 1   estradiol (ESTRACE VAGINAL) 0.1 MG/GM vaginal cream, Place 1 Applicatorful vaginally at bedtime., Disp: 42 each, Rfl: 12   fluocinonide gel (LIDEX) 0.05 %, Apply 1 application topically 3 (three) times daily as needed (rash)., Disp: , Rfl:    levothyroxine (SYNTHROID) 50 MCG tablet, TAKE 1 TABLET EVERY DAY, Disp: 90 tablet, Rfl: 2   losartan (COZAAR) 50 MG tablet, TAKE 1 TABLET EVERY DAY, Disp: 90 tablet, Rfl: 0   pantoprazole (PROTONIX) 40 MG tablet, TAKE 1 TABLET TWICE DAILY, Disp: 180 tablet, Rfl: 2  Observations/Objective: Patient is well-developed, well-nourished in no acute distress.  Resting comfortably at home.  Head is normocephalic, atraumatic.  No labored breathing. Speech is clear and coherent with logical content.  Patient is alert and oriented at baseline.   Assessment and Plan: 1. Positional lightheadedness (Primary) - meclizine (ANTIVERT) 25 MG tablet; Take 1 tablet (25 mg total) by mouth 3 (three) times daily as needed for dizziness.  Dispense: 30 tablet; Refill: 0  And dizziness. Ear pressure and popping without other URI symptoms. Denies chest pain, SOB, racing heart to warrant ER evaluation. Increase fluids. Rest. Start OTC Flonase.  Epley maneuvers recommended. Rx short course of meclizine. Needs in person follow-up within 48 hours (tomorrow christmas so PCP office and in person UC closed now for the next day). ER for any worsening symptoms.   Follow Up Instructions: I discussed the assessment and treatment plan with the patient. The patient was provided an opportunity to ask questions and all were answered. The patient agreed with the plan and demonstrated an understanding of the instructions.  A copy of instructions were sent to the patient via MyChart unless otherwise noted below.   The patient was advised to call back or seek an in-person evaluation if the symptoms worsen or if the condition fails to improve as anticipated.    Piedad Climes, PA-C

## 2023-08-27 NOTE — Patient Instructions (Signed)
Sharyn Lull, thank you for joining Piedad Climes, PA-C for today's virtual visit.  While this provider is not your primary care provider (PCP), if your PCP is located in our provider database this encounter information will be shared with them immediately following your visit.   A [facility] MyChart account gives you access to today's visit and all your visits, tests, and labs performed at [facility] " click here if you don't have a [facility] MyChart account or go to [email or web address]  Consent: (Patient) April Beck provided verbal consent for this virtual visit at the beginning of the encounter.  Current Medications:  Current Outpatient Medications:    meclizine (ANTIVERT) 25 MG tablet, Take 1 tablet (25 mg total) by mouth 3 (three) times daily as needed for dizziness., Disp: 30 tablet, Rfl: 0   acetaminophen (TYLENOL) 325 MG tablet, Take 2 tablets (650 mg total) by mouth every 6 (six) hours as needed for pain. (Patient taking differently: Take 650 mg by mouth every 6 (six) hours as needed for pain. Arthritis formula per pt), Disp: , Rfl:    acyclovir (ZOVIRAX) 400 MG tablet, TAKE 1 TABLET EVERY DAY, Disp: 90 tablet, Rfl: 0   atorvastatin (LIPITOR) 40 MG tablet, TAKE 1 TABLET EVERY DAY, Disp: 90 tablet, Rfl: 0   calcium-vitamin D (OSCAL WITH D) 250-125 MG-UNIT tablet, Take 1 tablet by mouth daily., Disp: , Rfl:    citalopram (CELEXA) 10 MG tablet, TAKE 1 TABLET EVERY DAY, Disp: 90 tablet, Rfl: 0   cyclobenzaprine (FLEXERIL) 5 MG tablet, Take 1 tablet (5 mg total) by mouth 3 (three) times daily as needed for muscle spasms., Disp: 30 tablet, Rfl: 1   estradiol (ESTRACE VAGINAL) 0.1 MG/GM vaginal cream, Place 1 Applicatorful vaginally at bedtime., Disp: 42 each, Rfl: 12   fluocinonide gel (LIDEX) 0.05 %, Apply 1 application topically 3 (three) times daily as needed (rash)., Disp: , Rfl:    levothyroxine (SYNTHROID) 50 MCG tablet, TAKE 1 TABLET EVERY DAY,  Disp: 90 tablet, Rfl: 2   losartan (COZAAR) 50 MG tablet, TAKE 1 TABLET EVERY DAY, Disp: 90 tablet, Rfl: 0   pantoprazole (PROTONIX) 40 MG tablet, TAKE 1 TABLET TWICE DAILY, Disp: 180 tablet, Rfl: 2   Medications ordered in this encounter:  Meds ordered this encounter  Medications   meclizine (ANTIVERT) 25 MG tablet    Sig: Take 1 tablet (25 mg total) by mouth 3 (three) times daily as needed for dizziness.    Dispense:  30 tablet    Refill:  0    Supervising Provider:   Merrilee Jansky [[id number]]     *If you need refills on other medications prior to your next appointment, please contact your pharmacy*  Follow-Up: Call back or seek an in-person evaluation if the symptoms worsen or if the condition fails to improve as anticipated.  [facility] [phone]  Other Instructions Hydrate and rest. Start exercises below. Start OTC nasal steroid spray like Flonase.  I did send in Meclizine to [facility] on [address] as due to holiday is only open pharmacy I can find near(ish) to your family for them to pick up for you.  As discussed if anything worsens, you have to be evaluated at nearest ER facility,. How to Perform the Epley Maneuver The Epley maneuver is an exercise that relieves symptoms of vertigo. Vertigo is the feeling that you or your surroundings are moving when they are not. When you feel vertigo, you may feel like  the room is spinning and may have trouble walking. The Epley maneuver is used for a type of vertigo caused by a calcium deposit in a part of the inner ear. The maneuver involves changing head positions to help the deposit move out of the area. You can do this maneuver at home whenever you have symptoms of vertigo. You can repeat it in 24 hours if your vertigo has not gone away. Even though the Epley maneuver may relieve your vertigo for a few weeks, it is possible that your symptoms will return. This maneuver relieves vertigo, but it does not relieve  dizziness. What are the risks? If it is done correctly, the Epley maneuver is considered safe. Sometimes it can lead to dizziness or nausea that goes away after a short time. If you develop other symptoms--such as changes in vision, weakness, or numbness--stop doing the maneuver and call your health care provider. Supplies needed: A bed or table. A pillow. How to do the Epley maneuver     Sit on the edge of a bed or table with your back straight and your legs extended or hanging over the edge of the bed or table. Turn your head halfway toward the affected ear or side as told by your health care provider. Lie backward quickly with your head turned until you are lying flat on your back. Your head should dangle (head-hanging position). You may want to position a pillow under your shoulders. Hold this position for at least 30 seconds. If you feel dizzy or have symptoms of vertigo, continue to hold the position until the symptoms stop. Turn your head to the opposite direction until your unaffected ear is facing down. Your head should continue to dangle. Hold this position for at least 30 seconds. If you feel dizzy or have symptoms of vertigo, continue to hold the position until the symptoms stop. Turn your whole body to the same side as your head so that you are positioned on your side. Your head will now be nearly facedown and no longer needs to dangle. Hold for at least 30 seconds. If you feel dizzy or have symptoms of vertigo, continue to hold the position until the symptoms stop. Sit back up. You can repeat the maneuver in 24 hours if your vertigo does not go away. Follow these instructions at home: For 24 hours after doing the Epley maneuver: Keep your head in an upright position. When lying down to sleep or rest, keep your head raised (elevated) with two or more pillows. Avoid excessive neck movements. Activity Do not drive or use machinery if you feel dizzy. After doing the Epley  maneuver, return to your normal activities as told by your health care provider. Ask your health care provider what activities are safe for you. General instructions Drink enough fluid to keep your urine pale yellow. Do not drink alcohol. Take over-the-counter and prescription medicines only as told by your health care provider. Keep all follow-up visits. This is important. Preventing vertigo symptoms Ask your health care provider if there is anything you should do at home to prevent vertigo. He or she may recommend that you: Keep your head elevated with two or more pillows while you sleep. Do not sleep on the side of your affected ear. Get up slowly from bed. Avoid sudden movements during the day. Avoid extreme head positions or movement, such as looking up or bending over. Contact a health care provider if: Your vertigo gets worse. You have other symptoms, including: Nausea.  Vomiting. Headache. Get help right away if you: Have vision changes. Have a headache or neck pain that is severe or getting worse. Cannot stop vomiting. Have new numbness or weakness in any part of your body. These symptoms may represent a serious problem that is an emergency. Do not wait to see if the symptoms will go away. Get medical help right away. Call your local emergency services (911 in the [place] Do not drive yourself to the hospital. Summary Vertigo is the feeling that you or your surroundings are moving when they are not. The Epley maneuver is an exercise that relieves symptoms of vertigo. If the Epley maneuver is done correctly, it is considered safe. This information is not intended to replace advice given to you by your health care provider. Make sure you discuss any questions you have with your health care provider. Document Revised: 07/20/2020 Document Reviewed: 07/20/2020 Elsevier Patient Education  2024 Elsevier Inc.   Benign Positional Vertigo Vertigo is the feeling that you or your  surroundings are moving when they are not. Benign positional vertigo is the most common form of vertigo. This is usually a harmless condition (benign). This condition is positional. This means that symptoms are triggered by certain movements and positions. This condition can be dangerous if it occurs while you are doing something that could cause harm to yourself or others. This includes activities such as driving or operating machinery. What are the causes? The inner ear has fluid-filled canals that help your brain sense movement and balance. When the fluid moves, the brain receives messages about your body's position. With benign positional vertigo, calcium crystals in the inner ear break free and disturb the inner ear area. This causes your brain to receive confusing messages about your body's position. What increases the risk? You are more likely to develop this condition if: You are a woman. You are 2 years of age or older. You have recently had a head injury. You have an inner ear disease. What are the signs or symptoms? Symptoms of this condition usually happen when you move your head or your eyes in different directions. Symptoms may start suddenly and usually last for less than a minute. They include: Loss of balance and falling. Feeling like you are spinning or moving. Feeling like your surroundings are spinning or moving. Nausea and vomiting. Blurred vision. Dizziness. Involuntary eye movement (nystagmus). Symptoms can be mild and cause only minor problems, or they can be severe and interfere with daily life. Episodes of benign positional vertigo may return (recur) over time. Symptoms may also improve over time. How is this diagnosed? This condition may be diagnosed based on: Your medical history. A physical exam of the head, neck, and ears. Positional tests to check for or stimulate vertigo. You may be asked to turn your head and change positions, such as going from sitting to  lying down. A health care provider will watch for symptoms of vertigo. You may be referred to a health care provider who specializes in ear, nose, and throat problems (ENT or otolaryngologist) or a provider who specializes in disorders of the nervous system (neurologist). How is this treated?  This condition may be treated in a session in which your health care provider moves your head in specific positions to help the displaced crystals in your inner ear move. Treatment for this condition may take several sessions. Surgery may be needed in severe cases, but this is rare. In some cases, benign positional vertigo may resolve on its  own in 2-4 weeks. Follow these instructions at home: Safety Move slowly. Avoid sudden body or head movements or certain positions, as told by your health care provider. Avoid driving or operating machinery until your health care provider says it is safe. Avoid doing any tasks that would be dangerous to you or others if vertigo occurs. If you have trouble walking or keeping your balance, try using a cane for stability. If you feel dizzy or unstable, sit down right away. Return to your normal activities as told by your health care provider. Ask your health care provider what activities are safe for you. General instructions Take over-the-counter and prescription medicines only as told by your health care provider. Drink enough fluid to keep your urine pale yellow. Keep all follow-up visits. This is important. Contact a health care provider if: You have a fever. Your condition gets worse or you develop new symptoms. Your family or friends notice any behavioral changes. You have nausea or vomiting that gets worse. You have numbness or a prickling and tingling sensation. Get help right away if you: Have difficulty speaking or moving. Are always dizzy or faint. Develop severe headaches. Have weakness in your legs or arms. Have changes in your hearing or  vision. Develop a stiff neck. Develop sensitivity to light. These symptoms may represent a serious problem that is an emergency. Do not wait to see if the symptoms will go away. Get medical help right away. Call your local emergency services (911 in the [place] Do not drive yourself to the hospital. Summary Vertigo is the feeling that you or your surroundings are moving when they are not. Benign positional vertigo is the most common form of vertigo. This condition is caused by calcium crystals in the inner ear that become displaced. This causes a disturbance in an area of the inner ear that helps your brain sense movement and balance. Symptoms include loss of balance and falling, feeling that you or your surroundings are moving, nausea and vomiting, and blurred vision. This condition can be diagnosed based on symptoms, a physical exam, and positional tests. Follow safety instructions as told by your health care provider and keep all follow-up visits. This is important. This information is not intended to replace advice given to you by your health care provider. Make sure you discuss any questions you have with your health care provider. Document Revised: 07/20/2020 Document Reviewed: 07/20/2020 Elsevier Patient Education  2024 Elsevier Inc.    If you have been instructed to have an in-person evaluation today at a local Urgent Care facility, please use the link below. It will take you to a list of all of our available [facility], including address, phone number and hours of operation. Please do not delay care.  [facility]  If you or a family member do not have a primary care provider, use the link below to schedule a visit and establish care. When you choose a [facility] primary care physician or advanced practice provider, you gain a long-term partner in health. Find a Primary Care Provider  Learn more about [facility]'s in-office and virtual care options: [facility] - Get Care Now

## 2023-08-29 ENCOUNTER — Other Ambulatory Visit: Payer: Self-pay

## 2023-08-29 ENCOUNTER — Encounter (HOSPITAL_COMMUNITY): Payer: PRIVATE HEALTH INSURANCE | Admitting: Physical Therapy

## 2023-08-29 ENCOUNTER — Encounter: Payer: Self-pay | Admitting: Emergency Medicine

## 2023-08-29 ENCOUNTER — Ambulatory Visit
Admission: EM | Admit: 2023-08-29 | Discharge: 2023-08-29 | Disposition: A | Discharge status: Home / Self Care | Payer: Medicare Other | Attending: Nurse Practitioner | Admitting: Nurse Practitioner

## 2023-08-29 DIAGNOSIS — R42 Dizziness and giddiness: Secondary | ICD-10-CM

## 2023-08-29 DIAGNOSIS — Z87898 Personal history of other specified conditions: Secondary | ICD-10-CM

## 2023-08-29 DIAGNOSIS — Z8669 Personal history of other diseases of the nervous system and sense organs: Secondary | ICD-10-CM

## 2023-08-29 MED ORDER — PREDNISONE 20 MG PO TABS: 40.0000 mg | ORAL_TABLET | Freq: Every day | ORAL | 0 refills | Status: AC

## 2023-08-29 NOTE — Discharge Instructions (Signed)
Take medication as prescribed. Continue meclizine as previously prescribed. Sure you are drinking plenty of fluids and allowing for plenty of rest. As discussed, if your symptoms are not improving with this treatment, it is recommended that you go to the emergency department for further evaluation.  This includes worsening dizziness, lightheadedness, confusion, visual changes, or other concerns. Follow-up with your PCP as soon as possible. Follow-up as needed.

## 2023-08-29 NOTE — ED Triage Notes (Addendum)
Pt reports dizziness and nausea since Tuesday. Right ear pressure x2 days. Pt reports was seen virtually and was prescribed meclizine. Pt reports using anti-nausea med and meclizine dizziness went away but returned last night.

## 2023-08-29 NOTE — ED Provider Notes (Addendum)
[facility]    CSN: [id number] Arrival date & time: 08/29/23  1257      History   Chief Complaint Chief Complaint  Patient presents with   Dizziness    HPI April Beck is a 77 y.o. female.   The history is provided by the patient.   Pt reports intermittent dizziness x 3 days with worsening symptoms while up walking. Pt states becoming nauseous and reports one episode of emesis. No unilateral weakness/numbness. Reports elevated BP since onset of vertigo. Reports hx of HTN.  Patient states that she does have a "popping noise" in her right ear.  States she completed a virtual urgent care visit and was prescribed meclizine 2 day ago.  States that she initially felt better, but dizziness returns today.  Patient reports prior history of vertigo.  States that she was prescribed prednisone, which appeared to help her symptoms.  Patient denies headache, blurred vision, chest pain, abdominal pain, diarrhea, or constipation. Per review of patient's chart, history of labyrinthitis.  Past Medical History:  Diagnosis Date   Anxiety    hx of panic attack   Arthritis    hands & knees   CKD (chronic kidney disease), stage III (HCC)    followed by pcp   Closed fracture of left distal radius 10/31/2021   Edema of both lower extremities    GERD (gastroesophageal reflux disease)    History of 2019 novel coronavirus disease (COVID-19) 09/17/2020   positive result in epic,  per pt very mild symtpoms that resolved   History of uterine fibroid    Hypertension    followed by pcp   Hypomagnesemia    Hypothyroidism    followed by pcp   IDA (iron deficiency anemia)    Osteopenia    Ovarian cyst    Pelvic pain    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, takes meloxicam   Wears contact lenses     Patient Active Problem List   Diagnosis Date Noted   Suburethral sling present 04/25/2023   Arthritis of carpometacarpal (CMC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 01/16/2022    Ovarian cyst 01/16/2021   Chronic constipation 11/21/2020   CKD (chronic kidney disease) stage 3, GFR 30-59 ml/min (HCC) 11/21/2020   Herpes 11/21/2020   Hypokalemia 11/21/2020   Weakness 11/17/2020   Aspiration into airway    Fall    Dysphagia 06/14/2013   Atrophic vaginitis 06/14/2013   Heel pain 06/14/2013   Hypercholesteremia    Hypertension    GERD (gastroesophageal reflux disease)    Hypothyroidism    Renal insufficiency 06/19/2012    Past Surgical History:  Procedure Laterality Date   ABDOMINAL HYSTERECTOMY  1982   CATARACT EXTRACTION Bilateral    CHOLECYSTECTOMY  06/20/2012   Procedure: LAPAROSCOPIC CHOLECYSTECTOMY;  Surgeon: Liz Malady, MD;  Location: [facility];  Service: General;  Laterality: N/A;   COLONOSCOPY  last one 03-20-2019  dr Christella Hartigan   DIAGNOSTIC LAPAROSCOPY  yrs ago   HIP ARTHROPLASTY Right 11/13/2020   Procedure: ARTHROPLASTY BIPOLAR HIP (HEMIARTHROPLASTY);  Surgeon: Vickki Hearing, MD;  Location: [facility];  Service: Orthopedics;  Laterality: Right;   INCONTINENCE SURGERY  09-11-2005   dr Annabell Howells  @[facility]    LYNX SLING   LAPAROSCOPIC SALPINGO OOPHERECTOMY Bilateral 01/16/2021   Procedure: DIAGNOSTIC LAPAROSCOPY;  Surgeon: Richarda Overlie, MD;  Location: [facility];  Service: Gynecology;  Laterality: Bilateral;   LAPAROSCOPY Bilateral 01/16/2021   Procedure: EXPLORATORY LAPAROTOMY BILATERAL SALPINGO  OOPHORECTOMY;  Surgeon: Richarda Overlie, MD;  Location[facility];  Service: Gynecology;  Laterality: Bilateral;   TUBAL LIGATION      OB History     Gravida  2   Para  2   Term      Preterm      AB      Living  2      SAB      IAB      Ectopic      Multiple      Live Births               Home Medications    Prior to Admission medications   Medication Sig Start Date End Date Taking? Authorizing Provider  predniSONE (DELTASONE) 20 MG tablet Take 2 tablets (40 mg total) by mouth daily with  breakfast for 5 days. 08/29/23 09/03/23 Yes Leath-Warren, Sadie Haber, NP  acetaminophen (TYLENOL) 325 MG tablet Take 2 tablets (650 mg total) by mouth every 6 (six) hours as needed for pain. Patient taking differently: Take 650 mg by mouth every 6 (six) hours as needed for pain. Arthritis formula per pt 06/21/12   Sherrie George, PA-C  acyclovir (ZOVIRAX) 400 MG tablet TAKE 1 TABLET EVERY DAY 06/25/23   Donita Brooks, MD  atorvastatin (LIPITOR) 40 MG tablet TAKE 1 TABLET EVERY DAY 06/25/23   Donita Brooks, MD  calcium-vitamin D (OSCAL WITH D) 250-125 MG-UNIT tablet Take 1 tablet by mouth daily.    [provider]  citalopram (CELEXA) 10 MG tablet TAKE 1 TABLET EVERY DAY 06/25/23   Donita Brooks, MD  cyclobenzaprine (FLEXERIL) 5 MG tablet Take 1 tablet (5 mg total) by mouth 3 (three) times daily as needed for muscle spasms. 07/16/23   Donita Brooks, MD  estradiol (ESTRACE VAGINAL) 0.1 MG/GM vaginal cream Place 1 Applicatorful vaginally at bedtime. 04/11/23   Donita Brooks, MD  fluocinonide gel (LIDEX) 0.05 % Apply 1 application topically 3 (three) times daily as needed (rash). 09/08/20   [provider]  levothyroxine (SYNTHROID) 50 MCG tablet TAKE 1 TABLET EVERY DAY 04/12/23   Donita Brooks, MD  losartan (COZAAR) 50 MG tablet TAKE 1 TABLET EVERY DAY 06/25/23   Donita Brooks, MD  meclizine (ANTIVERT) 25 MG tablet Take 1 tablet (25 mg total) by mouth 3 (three) times daily as needed for dizziness. 08/27/23   Waldon Merl, PA-C  pantoprazole (PROTONIX) 40 MG tablet TAKE 1 TABLET TWICE DAILY 04/12/23   Donita Brooks, MD    Family History Family History  Problem Relation Age of Onset   Colon polyps Brother    Breast cancer Maternal Grandmother    Diabetes Brother    Hypertension Brother    Colon cancer Neg Hx    Esophageal cancer Neg Hx    Rectal cancer Neg Hx    Stomach cancer Neg Hx     Social History Social History   Tobacco Use    Smoking status: Never   Smokeless tobacco: Never  Vaping Use   Vaping status: Never Used  Substance Use Topics   Alcohol use: No   Drug use: Never     Allergies   Patient has no known allergies.   Review of Systems Review of Systems Per HPI  Physical Exam Triage Vital Signs ED Triage Vitals  Encounter Vitals Group     BP 08/29/23 1608 (!) 142/81     Systolic BP Percentile --  Diastolic BP Percentile --      Pulse Rate 08/29/23 1608 (!) 108     Resp 08/29/23 1608 17     Temp 08/29/23 1608 98.1 F (36.7 C)     Temp Source 08/29/23 1608 Oral     SpO2 08/29/23 1608 95 %     Weight --      Height --      Head Circumference --      Peak Flow --      Pain Score 08/29/23 1615 0     Pain Loc --      Pain Education --      Exclude from Growth Chart --    Orthostatic VS for the past 24 hrs:  BP- Lying Pulse- Lying BP- Sitting Pulse- Sitting BP- Standing at 0 minutes Pulse- Standing at 0 minutes  08/29/23 1610 136/82 85 126/79 88 118/77 94    Updated Vital Signs BP (!) 142/81 (BP Location: Right Arm)   Pulse (!) 108   Temp 98.1 F (36.7 C) (Oral)   Resp 17   SpO2 95%   Visual Acuity Right Eye Distance:   Left Eye Distance:   Bilateral Distance:    Right Eye Near:   Left Eye Near:    Bilateral Near:     Physical Exam Vitals and nursing note reviewed.  Constitutional:      General: She is not in acute distress.    Appearance: Normal appearance.  HENT:     Head: Normocephalic.     Right Ear: Tympanic membrane, ear canal and external ear normal.     Left Ear: Tympanic membrane, ear canal and external ear normal.     Mouth/Throat:     Mouth: Mucous membranes are moist.  Eyes:     Extraocular Movements: Extraocular movements intact.     Pupils: Pupils are equal, round, and reactive to light.  Cardiovascular:     Rate and Rhythm: Regular rhythm. Tachycardia present.     Pulses: Normal pulses.     Heart sounds: Normal heart sounds.  Pulmonary:      Effort: Pulmonary effort is normal. No respiratory distress.     Breath sounds: Normal breath sounds. No stridor. No wheezing, rhonchi or rales.  Abdominal:     General: Bowel sounds are normal.     Palpations: Abdomen is soft.     Tenderness: There is no abdominal tenderness.  Musculoskeletal:     Cervical back: Normal range of motion.  Lymphadenopathy:     Cervical: No cervical adenopathy.  Skin:    General: Skin is warm and dry.  Neurological:     General: No focal deficit present.     Mental Status: She is alert and oriented to person, place, and time.     GCS: GCS eye subscore is 4. GCS verbal subscore is 5. GCS motor subscore is 6.     Cranial Nerves: Cranial nerves 2-12 are intact.     Sensory: Sensation is intact.     Motor: Motor function is intact.     Coordination: Coordination is intact.     Gait: Gait is intact.  Psychiatric:        Mood and Affect: Mood normal.        Behavior: Behavior normal.      UC Treatments / Results  Labs (all labs ordered are listed, but only abnormal results are displayed) Labs Reviewed - No data to display  EKG   Radiology No results found.  Procedures Procedures (including critical care time)  Medications Ordered in UC Medications - No data to display  Initial Impression / Assessment and Plan / UC Course  I have reviewed the triage vital signs and the nursing notes.  Pertinent labs & imaging results that were available during my care of the patient were reviewed by me and considered in my medical decision making (see chart for details).  Patient with episode of dizziness that is been present for the past 3 days.  Per review of patient's chart, patient with history of vertigo.  She was prescribed meclizine 2 days ago and initially felt relief, but dizziness returned today.  Neurological exam was within normal limits, she was negative for orthostatic hypotension.  Reviewed recent labs performed in November, which were all  within normal limits.  Discussion with patient and review of her chart shows that she did have the same or similar symptoms in 2022.  Patient was also prescribed prednisone at that time.  Patient reports that she did notice improvement after the prednisone.  Based on patient's symptoms and relief of symptoms with the prednisone, will start patient on prednisone 40 mg for the next 5 days.  Patient advised to continue meclizine.  Supportive care recommendations were provided and discussed with the patient to include fluids, rest, avoiding sudden movement, and avoiding driving while symptoms persist.  Patient was given strict ER follow-up precautions, and advised to follow-up with her PCP as scheduled.  Patient was in agreement with this plan of care and verbalizes understanding.  All questions were answered.  Patient stable for discharge.   Final Clinical Impressions(s) / UC Diagnoses   Final diagnoses:  Dizziness     Discharge Instructions      Take medication as prescribed. Continue meclizine as previously prescribed. Sure you are drinking plenty of fluids and allowing for plenty of rest. As discussed, if your symptoms are not improving with this treatment, it is recommended that you go to the emergency department for further evaluation.  This includes worsening dizziness, lightheadedness, confusion, visual changes, or other concerns. Follow-up with your PCP as soon as possible. Follow-up as needed.     ED Prescriptions     Medication Sig Dispense Auth. Provider   predniSONE (DELTASONE) 20 MG tablet Take 2 tablets (40 mg total) by mouth daily with breakfast for 5 days. 10 tablet Leath-Warren, Sadie Haber, NP      PDMP not reviewed this encounter.   Abran Cantor, NP 08/29/23 1707    Abran Cantor, NP 08/29/23 1732

## 2023-09-02 ENCOUNTER — Ambulatory Visit (INDEPENDENT_AMBULATORY_CARE_PROVIDER_SITE_OTHER): Payer: Medicare Other

## 2023-09-02 DIAGNOSIS — E7800 Pure hypercholesterolemia, unspecified: Secondary | ICD-10-CM

## 2023-09-02 DIAGNOSIS — I10 Essential (primary) hypertension: Secondary | ICD-10-CM

## 2023-09-04 ENCOUNTER — Other Ambulatory Visit (INDEPENDENT_AMBULATORY_CARE_PROVIDER_SITE_OTHER): Payer: Self-pay | Admitting: Family

## 2023-09-04 DIAGNOSIS — K219 Gastro-esophageal reflux disease without esophagitis: Secondary | ICD-10-CM

## 2023-09-05 ENCOUNTER — Encounter (HOSPITAL_COMMUNITY): Payer: PRIVATE HEALTH INSURANCE | Admitting: Physical Therapy

## 2023-09-05 ENCOUNTER — Ambulatory Visit: Payer: Medicare Other | Admitting: Family Medicine

## 2023-09-05 ENCOUNTER — Encounter (INDEPENDENT_AMBULATORY_CARE_PROVIDER_SITE_OTHER): Payer: Self-pay | Admitting: Residents

## 2023-09-05 VITALS — BP 138/82 | HR 88 | Temp 98.0°F | Ht 66.0 in | Wt 165.0 lb

## 2023-09-05 DIAGNOSIS — R42 Dizziness and giddiness: Secondary | ICD-10-CM

## 2023-09-05 LAB — ECHO ADULT TTE COMPLETE
AV Area (Cont Eq VTI): 2.404
AV Mean Gradient: 4
AV Peak Velocity: 1.29
Ao Root Diameter (2D): 3.5
BP Mod LV Ejection Fraction: 64
IVS Diastolic Thickness (2D): 0.9
LA Dimension (2D): 3.3
LA Volume Index (BP A-L): 22
LVID diastole (2D): 4.4
LVID systole (2D): 2.7
MV E/A: 0.75
MV E/e' (Average): 10.332
Prox Ascending Aorta Diameter: 2.8
RV Basal Diastolic Dimension: 3.3
RV Systolic Pressure: 28
TAPSE: 2.6

## 2023-09-05 MED ORDER — MECLIZINE HCL 25 MG PO TABS: 25.0000 mg | ORAL_TABLET | Freq: Three times a day (TID) | ORAL | 0 refills | Status: AC | PRN

## 2023-09-05 NOTE — Progress Notes (Signed)
 Subjective:    Patient ID: April Beck, female    DOB: [birth year], 78 y.o.   MRN: [id number]  Patient presents today complaining of dizziness.  She recently had to go to an urgent care.  She states that if she stood up, she started to feel sick and nauseated.  The room started to spin.  She will become extremely dizzy.  If she sat still everything would calm down and go away.  She went to an urgent care and was originally given prednisone  for labyrinthitis.  She also is taking meclizine  every 8 hours as needed.  She is out of the prednisone .  She still taking the meclizine .  The dizziness is faded.  She is no longer reporting vertigo with position changes.  I performed Dix-Hallpike maneuver today and the patient had intense vertigo when turning her head to the right. Past Medical History:  Diagnosis Date   Anxiety    hx of panic attack   Arthritis    hands & knees   CKD (chronic kidney disease), stage III (HCC)    followed by pcp   Closed fracture of left distal radius 10/31/2021   Edema of both lower extremities    GERD (gastroesophageal reflux disease)    History of 2019 novel coronavirus disease (COVID-19) 09/17/2020   positive result in epic,  per pt very mild symtpoms that resolved   History of uterine fibroid    Hypertension    followed by pcp   Hypomagnesemia    Hypothyroidism    followed by pcp   IDA (iron deficiency anemia)    Osteopenia    Ovarian cyst    Pelvic pain    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, takes meloxicam    Wears contact lenses    Past Surgical History:  Procedure Laterality Date   ABDOMINAL HYSTERECTOMY  1982   CATARACT EXTRACTION Bilateral    CHOLECYSTECTOMY  06/20/2012   Procedure: LAPAROSCOPIC CHOLECYSTECTOMY;  Surgeon: Dann FORBES Hummer, MD;  Location: [facility];  Service: General;  Laterality: N/A;   COLONOSCOPY  last one 03-20-2019  dr teressa   DIAGNOSTIC LAPAROSCOPY  yrs ago   HIP ARTHROPLASTY Right 11/13/2020    Procedure: ARTHROPLASTY BIPOLAR HIP (HEMIARTHROPLASTY);  Surgeon: Margrette Taft FORBES, MD;  Location: [facility];  Service: Orthopedics;  Laterality: Right;   INCONTINENCE SURGERY  09-11-2005   dr watt  @[facility]    LYNX SLING   LAPAROSCOPIC SALPINGO OOPHERECTOMY Bilateral 01/16/2021   Procedure: DIAGNOSTIC LAPAROSCOPY;  Surgeon: Johnnye Ade, MD;  [facility];  Service: Gynecology;  Laterality: Bilateral;   LAPAROSCOPY Bilateral 01/16/2021   Procedure: EXPLORATORY LAPAROTOMY BILATERAL SALPINGO OOPHORECTOMY;  Surgeon: Johnnye Ade, MD;  [facility];  Service: Gynecology;  Laterality: Bilateral;   TUBAL LIGATION     Current Outpatient Medications on File Prior to Visit  Medication Sig Dispense Refill   acetaminophen  (TYLENOL ) 325 MG tablet Take 2 tablets (650 mg total) by mouth every 6 (six) hours as needed for pain. (Patient taking differently: Take 650 mg by mouth every 6 (six) hours as needed for pain. Arthritis formula per pt)     acyclovir  (ZOVIRAX ) 400 MG tablet TAKE 1 TABLET EVERY DAY 90 tablet 0   atorvastatin  (LIPITOR) 40 MG tablet TAKE 1 TABLET EVERY DAY 90 tablet 0   calcium -vitamin D  (OSCAL WITH D) 250-125 MG-UNIT tablet Take 1 tablet by mouth daily.     citalopram  (CELEXA ) 10 MG tablet TAKE  1 TABLET EVERY DAY 90 tablet 0   cyclobenzaprine  (FLEXERIL ) 5 MG tablet Take 1 tablet (5 mg total) by mouth 3 (three) times daily as needed for muscle spasms. 30 tablet 1   estradiol  (ESTRACE  VAGINAL) 0.1 MG/GM vaginal cream Place 1 Applicatorful vaginally at bedtime. 42 each 12   fluocinonide gel (LIDEX) 0.05 % Apply 1 application topically 3 (three) times daily as needed (rash).     levothyroxine  (SYNTHROID ) 50 MCG tablet TAKE 1 TABLET EVERY DAY 90 tablet 2   losartan  (COZAAR ) 50 MG tablet TAKE 1 TABLET EVERY DAY 90 tablet 0   meclizine  (ANTIVERT ) 25 MG tablet Take 1 tablet (25 mg total) by mouth 3 (three) times daily as needed for dizziness. 30  tablet 0   pantoprazole  (PROTONIX ) 40 MG tablet TAKE 1 TABLET TWICE DAILY 180 tablet 2   No current facility-administered medications on file prior to visit.   No Known Allergies Social History   Socioeconomic History   Marital status: Divorced    Spouse name: Not on file   Number of children: Not on file   Years of education: Not on file   Highest education level: 12th grade  Occupational History   Not on file  Tobacco Use   Smoking status: Never   Smokeless tobacco: Never  Vaping Use   Vaping status: Never Used  Substance and Sexual Activity   Alcohol use: No   Drug use: Never   Sexual activity: Not Currently    Birth control/protection: Surgical  Other Topics Concern   Not on file  Social History Narrative   Not on file   Social Drivers of Health   Financial Resource Strain: Low Risk  (12/14/2022)   Overall Financial Resource Strain (CARDIA)    Difficulty of Paying Living Expenses: Not very hard  Food Insecurity: No Food Insecurity (07/12/2023)   Hunger Vital Sign    Worried About Running Out of Food in the Last Year: Never true    Ran Out of Food in the Last Year: Never true  Transportation Needs: No Transportation Needs (07/12/2023)   PRAPARE - Administrator, Civil Service (Medical): No    Lack of Transportation (Non-Medical): No  Physical Activity: Unknown (07/12/2023)   Exercise Vital Sign    Days of Exercise per Week: 0 days    Minutes of Exercise per Session: Not on file  Stress: No Stress Concern Present (07/12/2023)   [employer] of Occupational Health - Occupational Stress Questionnaire    Feeling of Stress : Not at all  Social Connections: Unknown (07/12/2023)   Social Connection and Isolation Panel [NHANES]    Frequency of Communication with Friends and Family: Patient declined    Frequency of Social Gatherings with Friends and Family: Twice a week    Attends Religious Services: More than 4 times per year    Active Member of [employer] or  Organizations: No    Attends Banker Meetings: Not on file    Marital Status: Divorced  Intimate Partner Violence: Not At Risk (02/23/2021)   Humiliation, Afraid, Rape, and Kick questionnaire    Fear of Current or Ex-Partner: No    Emotionally Abused: No    Physically Abused: No    Sexually Abused: No       Review of Systems  All other systems reviewed and are negative.      Objective:   Physical Exam Vitals reviewed.  Constitutional:      General: She is not in  acute distress.    Appearance: She is well-developed. She is not diaphoretic.  HENT:     Head: Normocephalic and atraumatic.     Right Ear: External ear normal.     Left Ear: External ear normal.     Nose: Nose normal.     Mouth/Throat:     Pharynx: No oropharyngeal exudate.  Eyes:     General: No scleral icterus.       Right eye: No discharge.        Left eye: No discharge.     Conjunctiva/sclera: Conjunctivae normal.     Pupils: Pupils are equal, round, and reactive to light.  Neck:     Thyroid : No thyromegaly.     Vascular: No JVD.     Trachea: No tracheal deviation.  Cardiovascular:     Rate and Rhythm: Normal rate and regular rhythm.     Heart sounds: Normal heart sounds. No murmur heard.    No friction rub. No gallop.  Pulmonary:     Effort: Pulmonary effort is normal. No respiratory distress.     Breath sounds: Normal breath sounds. No stridor. No wheezing or rales.  Chest:     Chest wall: No tenderness.  Abdominal:     General: Bowel sounds are normal. There is no distension.     Palpations: Abdomen is soft. There is no mass.     Tenderness: There is no abdominal tenderness. There is no guarding or rebound.  Musculoskeletal:        General: No tenderness or deformity.     Cervical back: Neck supple. No edema or rigidity. Pain with movement and muscular tenderness present. No spinous process tenderness. Decreased range of motion.  Lymphadenopathy:     Cervical: No cervical  adenopathy.  Skin:    General: Skin is warm.     Coloration: Skin is not pale.     Findings: No erythema or rash.  Neurological:     Mental Status: She is alert and oriented to person, place, and time.     Cranial Nerves: No cranial nerve deficit.     Motor: No abnormal muscle tone.     Coordination: Coordination normal.     Deep Tendon Reflexes: Reflexes are normal and symmetric. Reflexes normal.  Psychiatric:        Behavior: Behavior normal.        Thought Content: Thought content normal.        Judgment: Judgment normal.           Assessment & Plan:  Vertigo I believe the patient is having benign paroxysmal positional vertigo.  Her symptoms have improved dramatically.  She can start using meclizine  every 8 hours as needed.  Recheck immediately if symptoms worsen or change.  Seems the symptoms are gradually fading on their own.

## 2023-09-06 ENCOUNTER — Ambulatory Visit (HOSPITAL_COMMUNITY): Payer: Medicare Other | Attending: Family Medicine | Admitting: Physical Therapy

## 2023-09-06 ENCOUNTER — Other Ambulatory Visit: Payer: Self-pay | Admitting: Family Medicine

## 2023-09-06 DIAGNOSIS — R29898 Other symptoms and signs involving the musculoskeletal system: Secondary | ICD-10-CM | POA: Diagnosis not present

## 2023-09-06 DIAGNOSIS — M6281 Muscle weakness (generalized): Secondary | ICD-10-CM | POA: Insufficient documentation

## 2023-09-06 DIAGNOSIS — I10 Essential (primary) hypertension: Secondary | ICD-10-CM

## 2023-09-06 DIAGNOSIS — M542 Cervicalgia: Secondary | ICD-10-CM | POA: Diagnosis not present

## 2023-09-06 DIAGNOSIS — N1831 Chronic kidney disease, stage 3a: Secondary | ICD-10-CM

## 2023-09-06 NOTE — Therapy (Signed)
 OUTPATIENT PHYSICAL THERAPY TREATMENT/Discharge    Patient Name: April Beck MRN: [id number] DOB:01-30-1946, 78 y.o., female Today's Date: 09/06/2023 PHYSICAL THERAPY DISCHARGE SUMMARY  Visits from Start of Care: 6  Current functional level related to goals / functional outcomes: Goals met    Remaining deficits: Periodic pain    Education / Equipment: HEP   Patient agrees to discharge. Patient goals were met. Patient is being discharged due to being pleased with the current functional level.  END OF SESSION:  PT End of Session - 09/06/23 1426     Visit Number 6    Number of Visits 6    Date for PT Re-Evaluation 09/03/23    Progress Note Due on Visit 10    PT Start Time 1345    PT Stop Time 1430    PT Time Calculation (min) 45 min    Activity Tolerance Patient tolerated treatment well    Behavior During Therapy WFL for tasks assessed/performed                 Past Medical History:  Diagnosis Date   Anxiety    hx of panic attack   Arthritis    hands & knees   CKD (chronic kidney disease), stage III (HCC)    followed by pcp   Closed fracture of left distal radius 10/31/2021   Edema of both lower extremities    GERD (gastroesophageal reflux disease)    History of 2019 novel coronavirus disease (COVID-19) 09/17/2020   positive result in epic,  per pt very mild symtpoms that resolved   History of uterine fibroid    Hypertension    followed by pcp   Hypomagnesemia    Hypothyroidism    followed by pcp   IDA (iron deficiency anemia)    Osteopenia    Ovarian cyst    Pelvic pain    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, takes meloxicam    Wears contact lenses    Past Surgical History:  Procedure Laterality Date   ABDOMINAL HYSTERECTOMY  1982   CATARACT EXTRACTION Bilateral    CHOLECYSTECTOMY  06/20/2012   Procedure: LAPAROSCOPIC CHOLECYSTECTOMY;  Surgeon: Dann FORBES Hummer, MD;  Location: [facility];  Service: General;  Laterality:  N/A;   COLONOSCOPY  last one 03-20-2019  dr teressa   DIAGNOSTIC LAPAROSCOPY  yrs ago   HIP ARTHROPLASTY Right 11/13/2020   Procedure: ARTHROPLASTY BIPOLAR HIP (HEMIARTHROPLASTY);  Surgeon: Margrette Taft FORBES, MD;  Location: [facility];  Service: Orthopedics;  Laterality: Right;   INCONTINENCE SURGERY  09-11-2005   dr watt  @[facility]    LYNX SLING   LAPAROSCOPIC SALPINGO OOPHERECTOMY Bilateral 01/16/2021   Procedure: DIAGNOSTIC LAPAROSCOPY;  Surgeon: Johnnye Ade, MD;  [facility];  Service: Gynecology;  Laterality: Bilateral;   LAPAROSCOPY Bilateral 01/16/2021   Procedure: EXPLORATORY LAPAROTOMY BILATERAL SALPINGO OOPHORECTOMY;  Surgeon: Johnnye Ade, MD;  Location: [facility];  Service: Gynecology;  Laterality: Bilateral;   TUBAL LIGATION     Patient Active Problem List   Diagnosis Date Noted   Suburethral sling present 04/25/2023   Arthritis of carpometacarpal [facility]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 01/16/2022   Ovarian cyst 01/16/2021   Chronic constipation 11/21/2020   CKD (chronic kidney disease) stage 3, GFR 30-59 ml/min (HCC) 11/21/2020   Herpes 11/21/2020   Hypokalemia 11/21/2020   Weakness 11/17/2020   Aspiration into airway    Fall    Dysphagia 06/14/2013   Atrophic vaginitis 06/14/2013  Heel pain 06/14/2013   Hypercholesteremia    Hypertension    GERD (gastroesophageal reflux disease)    Hypothyroidism    Renal insufficiency 06/19/2012    PCP: Duanne Butler DASEN, MD   REFERRING PROVIDER: Duanne Butler DASEN, MD   REFERRING DIAG:  M54.2 (ICD-10-CM) - Neck pain M50.30 (ICD-10-CM) - DDD (degenerative disc disease), cervical  THERAPY DIAG:  Neck tightness  Muscle weakness (generalized)  Neck pain  Rationale for Evaluation and Treatment: Rehabilitation  ONSET DATE: >1 year   SUBJECTIVE:                                                                                                                                                                                                          SUBJECTIVE STATEMENT:PT states that she got vertigo and was out for a week.  She is better now.  She states that her neck is doing better.  PT feels at least 30% better.   PERTINENT HISTORY:  Per MD note on 07/16/23, For the last several months she has been dealing with neck pain. She states that it hurts to turn her head side-to-side. She denies any neuropathic pain radiating down the arms. She denies any weakness in her arms. She denies any numbness or tingling in her arms. Patient has CT scan of the neck in February of this year that did show degenerative disc disease between C3 and C5. There was no significant nerve impingement. The patient is unable to take NSAIDs due to her chronic kidney disease. Tylenol  is not helping very much. PAIN:  Are you having pain? Yes: NPRS scale: 4/10 Pain location: neck while turning  Pain description: sharp, stabbing   Aggravating factors: turning  Relieving factors: stop turning   PRECAUTIONS: None  RED FLAGS: None     WEIGHT BEARING RESTRICTIONS: No  FALLS:  Has patient fallen in last 6 months? No  LIVING ENVIRONMENT: Lives with: lives alone Lives in: House/apartment Stairs: Yes: External: 4 steps; can reach both Has following equipment at home: Single point cane and Walker - 2 wheeled  OCCUPATION: retired   PLOF: Independent  PATIENT GOALS: to help me deal with the pain   OBJECTIVE:  Note: Objective measures were completed at Evaluation unless otherwise noted.  DIAGNOSTIC FINDINGS:  N/A  PATIENT SURVEYS:  NDI 24%                       20% COGNITION: Overall cognitive status: Within functional limits for tasks assessed  SENSATION: WFL  POSTURE: rounded shoulders and forward  head  PALPATION: Trigger points notable in B UT (R>L) Significant tightness in B UT, cervical paraspinals, and suboccipitals    CERVICAL ROM:   Active ROM A/PROM (%) eval 09/06/23  Flexion WFL    Extension WFL   Right lateral flexion 50% 100%  Left lateral flexion 50% 100%   Right rotation 50%  90%  Left rotation 50% 80%    (Blank rows = not tested) Cervical isometric:  Rt SB: wfl    , Lt SB wfl  , Extension wfl  UPPER EXTREMITY ROM:  B UE AROM WFL    UPPER EXTREMITY MMT:  MMT Right eval 09/06/23 Left eval 09/06/23  Shoulder flexion 4-* 5 4-* 5  Shoulder extension      Shoulder abduction 4-* 5 4-* 5  Shoulder adduction      Shoulder internal rotation 4 5 4 5   Shoulder external rotation 4 5 4 5   Middle trapezius      Lower trapezius      Elbow flexion 4+ 5 4+ 5  Elbow extension 4+ 5 4+ 5   (Blank rows = not tested)  CERVICAL SPECIAL TESTS:  N/A     TODAY'S TREATMENT:                                                                                                                              DATE: 09/06/23 Reassessment see above  Cervical ROM  manual to include traction, jt mobs to improve ROM and efflurage/petrissage for pain relief.   08/21/23 Standing postural exercises with red theraband: Scapular retraction x 10 Rows x 10 Shoulder extension B x 10 Sitting: Cervical and thoracic excursions x 3 each Supine: Cervical/scapular retraction x 10 Cervical rotation x 5 B Manual to include traction, jt mobs to improve ROM and efflurage/petrissage for pain relief.   08/14/23 Supine manual with efflurage/petrissage to decrease spasms          retraction cervical 10X5  Scaular retraction 10X5  Cervical retraction 10   Passive UT stretch 3X20 each direction  Jt mobs          Cervical rotation  Supine decompression exercises with theraband (Red):  The Overhead, The side pull, Sash and ER all x 5 Sitting shoulder rolls retro x 10             Cervical and scapular retraction together x 10 08/07/23 Supine manual with efflurage/petrissage to decrease spasms          retraction cervical 10X5  Scaular retraction 10X5  Cervical retraction 10   Passive UT stretch  3X20 each direction  Jt mobs          Cervical rotation  Sitting: cervical excursions x 3 Scapular retraction x 5 Upper body bike backward x 3 minutes   PATIENT EDUCATION:  Education details: HEP, POC, goals  Person educated: Patient Education method: Explanation, Demonstration, and Handouts Education comprehension: verbalized understanding, returned demonstration, and needs further education  HOME EXERCISE PROGRAM: Access Code: [email or web address] URL: [email or web address] Date: 07/23/2023 Prepared by: Maryanne Finder  Exercises - Seated Flexion Stretch  - 2-3 x daily - 5-7 x weekly - 10 reps - Seated Upper Trapezius Stretch  - 2-3 x daily - 5-7 x weekly - 3-5 reps - 10-30 second hold - Seated Levator Scapulae Stretch  - 2-3 x daily - 5-7 x weekly - 3-5 reps - 10-30 second hold  Date: 07/31/2023 Prepared by: Greig Fuse Exercises - Seated Cervical Retraction  - 2 x daily - 2 sets - 10 reps - 3 hold - Seated Scapular Retraction with External Rotation  - 2 x daily - 2 sets - 10 reps - 5 sec hold         12 /4/24           - Supine Isometric Neck Rotation  - 1 x daily - 7 x weekly - 1 sets - 10 reps - 3-5 hold - Supine Head Nod with Deep Neck Flexor Activation  - 1 x daily - 7 x weekly - 1 sets - 10 reps - 3-5 hold ASSESSMENT:  CLINICAL IMPRESSION: PT reassessed with pt feeling at least 30% better.  Pt has chronic neck problems so full relief is not feasible but is pain free with improved ROM, strength and functional ability.  PT still has min-mod mm spasm in Lt trap.  Therapist encouraged pt to go to massage therapist. Goals met pt to be discharged.   OBJECTIVE IMPAIRMENTS: decreased activity tolerance, decreased ROM, decreased strength, hypomobility, impaired flexibility, postural dysfunction, and pain.   ACTIVITY LIMITATIONS: carrying and lifting  PARTICIPATION LIMITATIONS: cleaning, driving, and yard work  PERSONAL FACTORS: Age and Behavior pattern are also affecting  patient's functional outcome.   REHAB POTENTIAL: Good  CLINICAL DECISION MAKING: Stable/uncomplicated  EVALUATION COMPLEXITY: Low   GOALS: Goals reviewed with patient? Yes  SHORT TERM GOALS: Target date: 08/12/2023  Patient will be independent in HEP to improve strength/mobility for better functional independence with ADLs. Baseline:  Goal status: met    LONG TERM GOALS: Target date: 09/02/2023   Patient will reduce Neck Disability Index score to <10% to demonstrate minimal disability with ADL's including improved sleeping tolerance, sitting tolerance, etc for better mobility at home and work. Baseline:  Goal status: met  2.  Patient will improve cervical rotation and sidebending ROM with reports of <2/10 pain.  Baseline:  Goal status: met  3.  Patient will improve B UE strength to 4+/5 or greater to improve ability to perform ADLs and other household work.  Baseline:  Goal status:met  4.  Patient will report <2/10 pain in neck on NRPS while driving turning L/R to improve ability to complete driving and other activities involving cervical rotation.  Baseline:  Goal status: on-going currently greatest os a 3    PLAN:  PT FREQUENCY: 1-2x/week  PT DURATION: 6 weeks  PLANNED INTERVENTIONS: 97164- PT Re-evaluation, 97110-Therapeutic exercises, 97530- Therapeutic activity, 97112- Neuromuscular re-education, 97535- Self Care, [id number]- Manual therapy, 97014- Electrical stimulation (unattended), [phone]- Electrical stimulation (manual), Patient/Family education, Dry Needling, Joint mobilization, Joint manipulation, Spinal manipulation, Spinal mobilization, Cryotherapy, and Moist heat  PLAN FOR NEXT SESSION: Continue with cervical strengthening/ stretching.  Add theraband postural exercise next treatment  Montie Metro, PT CLT [phone]   09/06/2023, 2:27 PM

## 2023-09-10 ENCOUNTER — Ambulatory Visit
Admission: RE | Admit: 2023-09-10 | Discharge: 2023-09-10 | Disposition: A | Discharge status: Home / Self Care | Payer: Medicare Other | Source: Ambulatory Visit | Attending: Family Medicine | Admitting: Family Medicine

## 2023-09-10 ENCOUNTER — Other Ambulatory Visit: Payer: Self-pay

## 2023-09-10 VITALS — BP 108/75 | HR 93 | Temp 98.3°F | Resp 20

## 2023-09-10 DIAGNOSIS — H6123 Impacted cerumen, bilateral: Secondary | ICD-10-CM | POA: Diagnosis not present

## 2023-09-10 DIAGNOSIS — H938X1 Other specified disorders of right ear: Secondary | ICD-10-CM | POA: Diagnosis not present

## 2023-09-10 DIAGNOSIS — J3489 Other specified disorders of nose and nasal sinuses: Secondary | ICD-10-CM

## 2023-09-10 MED ORDER — CARBAMIDE PEROXIDE 6.5 % OT SOLN: 5.0000 [drp] | Freq: Two times a day (BID) | OTIC | 0 refills | Status: DC

## 2023-09-10 MED ORDER — PREDNISONE 20 MG PO TABS: 40.0000 mg | ORAL_TABLET | Freq: Every day | ORAL | 0 refills | Status: DC

## 2023-09-10 MED ORDER — AZELASTINE HCL 0.1 % NA SOLN: 1.0000 | Freq: Two times a day (BID) | NASAL | 0 refills | Status: DC

## 2023-09-10 NOTE — ED Triage Notes (Signed)
 Pt reports sore throat, right ear pain x2 days. Pt reports hearing clicking sound with swallowing and general malaise.

## 2023-09-10 NOTE — ED Provider Notes (Signed)
 [facility]    CSN: [id number] Arrival date & time: 09/10/23  1309      History   Chief Complaint Chief Complaint  Patient presents with   Cough    Runny nose right nostril only. Sore throat and clicking in right ear when swallowing. - Entered by patient    HPI April Beck is a 78 y.o. female.   Patient resenting today with 2-day history of sore throat, right ear pressure and popping, sinus drainage.  Denies fever, chills, significant cough, chest pain, shortness of breath, abdominal pain, nausea vomiting or diarrhea.  So far tried Tylenol  with minimal relief.    Past Medical History:  Diagnosis Date   Anxiety    hx of panic attack   Arthritis    hands & knees   CKD (chronic kidney disease), stage III (HCC)    followed by pcp   Closed fracture of left distal radius 10/31/2021   Edema of both lower extremities    GERD (gastroesophageal reflux disease)    History of 2019 novel coronavirus disease (COVID-19) 09/17/2020   positive result in epic,  per pt very mild symtpoms that resolved   History of uterine fibroid    Hypertension    followed by pcp   Hypomagnesemia    Hypothyroidism    followed by pcp   IDA (iron deficiency anemia)    Osteopenia    Ovarian cyst    Pelvic pain    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, takes meloxicam    Wears contact lenses     Patient Active Problem List   Diagnosis Date Noted   Suburethral sling present 04/25/2023   Arthritis of carpometacarpal ([facility]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 01/16/2022   Ovarian cyst 01/16/2021   Chronic constipation 11/21/2020   CKD (chronic kidney disease) stage 3, GFR 30-59 ml/min (HCC) 11/21/2020   Herpes 11/21/2020   Hypokalemia 11/21/2020   Weakness 11/17/2020   Aspiration into airway    Fall    Dysphagia 06/14/2013   Atrophic vaginitis 06/14/2013   Heel pain 06/14/2013   Hypercholesteremia    Hypertension    GERD (gastroesophageal reflux disease)     Hypothyroidism    Renal insufficiency 06/19/2012    Past Surgical History:  Procedure Laterality Date   ABDOMINAL HYSTERECTOMY  1982   CATARACT EXTRACTION Bilateral    CHOLECYSTECTOMY  06/20/2012   Procedure: LAPAROSCOPIC CHOLECYSTECTOMY;  Surgeon: Dann FORBES Hummer, MD;  Location: [facility];  Service: General;  Laterality: N/A;   COLONOSCOPY  last one 03-20-2019  dr teressa   DIAGNOSTIC LAPAROSCOPY  yrs ago   HIP ARTHROPLASTY Right 11/13/2020   Procedure: ARTHROPLASTY BIPOLAR HIP (HEMIARTHROPLASTY);  Surgeon: Margrette Taft FORBES, MD;  Location: [facility];  Service: Orthopedics;  Laterality: Right;   INCONTINENCE SURGERY  09-11-2005   dr watt  @[facility]    LYNX SLING   LAPAROSCOPIC SALPINGO OOPHERECTOMY Bilateral 01/16/2021   Procedure: DIAGNOSTIC LAPAROSCOPY;  Surgeon: Johnnye Ade, MD;  Location: [facility];  Service: Gynecology;  Laterality: Bilateral;   LAPAROSCOPY Bilateral 01/16/2021   Procedure: EXPLORATORY LAPAROTOMY BILATERAL SALPINGO OOPHORECTOMY;  Surgeon: Johnnye Ade, MD;  Location: [facility];  Service: Gynecology;  Laterality: Bilateral;   TUBAL LIGATION      OB History     Gravida  2   Para  2   Term      Preterm      AB      Living  2  SAB      IAB      Ectopic      Multiple      Live Births               Home Medications    Prior to Admission medications   Medication Sig Start Date End Date Taking? Authorizing Provider  azelastine  (ASTELIN ) 0.1 % nasal spray Place 1 spray into both nostrils 2 (two) times daily. Use in each nostril as directed 09/10/23  Yes Stuart Vernell Norris, PA-C  carbamide peroxide (DEBROX) 6.5 % OTIC solution Place 5 drops into both ears 2 (two) times daily. 09/10/23  Yes Stuart Vernell Norris, PA-C  predniSONE  (DELTASONE ) 20 MG tablet Take 2 tablets (40 mg total) by mouth daily with breakfast. 09/10/23  Yes Stuart Vernell Norris, PA-C  acetaminophen  (TYLENOL ) 325 MG tablet Take 2  tablets (650 mg total) by mouth every 6 (six) hours as needed for pain. Patient taking differently: Take 650 mg by mouth every 6 (six) hours as needed for pain. Arthritis formula per pt 06/21/12   Tonnie George, PA-C  acyclovir  (ZOVIRAX ) 400 MG tablet TAKE 1 TABLET EVERY DAY 09/06/23   Duanne Butler DASEN, MD  atorvastatin  (LIPITOR) 40 MG tablet TAKE 1 TABLET EVERY DAY 09/06/23   Duanne Butler DASEN, MD  calcium -vitamin D  (OSCAL WITH D) 250-125 MG-UNIT tablet Take 1 tablet by mouth daily.    [provider]  citalopram  (CELEXA ) 10 MG tablet TAKE 1 TABLET EVERY DAY 09/06/23   Duanne Butler DASEN, MD  cyclobenzaprine  (FLEXERIL ) 5 MG tablet Take 1 tablet (5 mg total) by mouth 3 (three) times daily as needed for muscle spasms. 07/16/23   Duanne Butler DASEN, MD  estradiol  (ESTRACE  VAGINAL) 0.1 MG/GM vaginal cream Place 1 Applicatorful vaginally at bedtime. 04/11/23   Duanne Butler DASEN, MD  fluocinonide gel (LIDEX) 0.05 % Apply 1 application topically 3 (three) times daily as needed (rash). 09/08/20   [provider]  levothyroxine  (SYNTHROID ) 50 MCG tablet TAKE 1 TABLET EVERY DAY 04/12/23   Duanne Butler DASEN, MD  losartan  (COZAAR ) 50 MG tablet TAKE 1 TABLET EVERY DAY 09/06/23   Duanne Butler DASEN, MD  meclizine  (ANTIVERT ) 25 MG tablet Take 1 tablet (25 mg total) by mouth 3 (three) times daily as needed for dizziness. 08/27/23   Gladis Elsie BROCKS, PA-C  meclizine  (ANTIVERT ) 25 MG tablet Take 1 tablet (25 mg total) by mouth 3 (three) times daily as needed. 09/05/23   Duanne Butler DASEN, MD  pantoprazole  (PROTONIX ) 40 MG tablet TAKE 1 TABLET TWICE DAILY 04/12/23   Duanne Butler DASEN, MD    Family History Family History  Problem Relation Age of Onset   Colon polyps Brother    Breast cancer Maternal Grandmother    Diabetes Brother    Hypertension Brother    Colon cancer Neg Hx    Esophageal cancer Neg Hx    Rectal cancer Neg Hx    Stomach cancer Neg Hx     Social History Social History   Tobacco Use    Smoking status: Never   Smokeless tobacco: Never  Vaping Use   Vaping status: Never Used  Substance Use Topics   Alcohol use: No   Drug use: Never     Allergies   Patient has no known allergies.   Review of Systems Review of Systems Per HPI  Physical Exam Triage Vital Signs ED Triage Vitals  Encounter Vitals Group     BP 09/10/23 1338 108/75  Systolic BP Percentile --      Diastolic BP Percentile --      Pulse Rate 09/10/23 1338 93     Resp 09/10/23 1338 20     Temp 09/10/23 1338 98.3 F (36.8 C)     Temp Source 09/10/23 1338 Oral     SpO2 09/10/23 1338 95 %     Weight --      Height --      Head Circumference --      Peak Flow --      Pain Score 09/10/23 1336 0     Pain Loc --      Pain Education --      Exclude from Growth Chart --    No data found.  Updated Vital Signs BP 108/75 (BP Location: Right Arm)   Pulse 93   Temp 98.3 F (36.8 C) (Oral)   Resp 20   SpO2 95%   Visual Acuity Right Eye Distance:   Left Eye Distance:   Bilateral Distance:    Right Eye Near:   Left Eye Near:    Bilateral Near:     Physical Exam Vitals and nursing note reviewed.  Constitutional:      Appearance: Normal appearance. She is not ill-appearing.  HENT:     Head: Atraumatic.     Right Ear: There is impacted cerumen.     Left Ear: There is impacted cerumen.     Nose: Rhinorrhea present.     Mouth/Throat:     Mouth: Mucous membranes are moist.     Pharynx: Oropharynx is clear. No posterior oropharyngeal erythema.  Eyes:     Extraocular Movements: Extraocular movements intact.     Conjunctiva/sclera: Conjunctivae normal.  Cardiovascular:     Rate and Rhythm: Normal rate and regular rhythm.     Heart sounds: Normal heart sounds.  Pulmonary:     Effort: Pulmonary effort is normal.     Breath sounds: Normal breath sounds.  Musculoskeletal:        General: Normal range of motion.     Cervical back: Normal range of motion and neck supple.  Skin:     General: Skin is warm and dry.  Neurological:     Mental Status: She is alert and oriented to person, place, and time.     Motor: No weakness.     Gait: Gait normal.  Psychiatric:        Mood and Affect: Mood normal.        Thought Content: Thought content normal.        Judgment: Judgment normal.      UC Treatments / Results  Labs (all labs ordered are listed, but only abnormal results are displayed) Labs Reviewed - No data to display  EKG   Radiology No results found.  Procedures Procedures (including critical care time)  Medications Ordered in UC Medications - No data to display  Initial Impression / Assessment and Plan / UC Course  I have reviewed the triage vital signs and the nursing notes.  Pertinent labs & imaging results that were available during my care of the patient were reviewed by me and considered in my medical decision making (see chart for details).     Treat with short course of prednisone , Astelin  nasal spray, over-the-counter decongestants for sinus drainage and suspected eustachian tube dysfunction.  She does also have bilateral cerumen impaction so Debrox drops, warm water lavage at home recommended.  Return for worsening symptoms.  Final  Clinical Impressions(s) / UC Diagnoses   Final diagnoses:  Sinus drainage  Ear pressure, right  Bilateral impacted cerumen     Discharge Instructions      In addition to the prescribed medications, you may use Coricidin HBP, antihistamines, saline sinus rinses, humidifiers.  I have also sent in wax softening drops and you may use warm water lavage at home gently to remove the wax plugs in both ears    ED Prescriptions     Medication Sig Dispense Auth. Provider   carbamide peroxide (DEBROX) 6.5 % OTIC solution Place 5 drops into both ears 2 (two) times daily. 15 mL Stuart Vernell Norris, PA-C   azelastine  (ASTELIN ) 0.1 % nasal spray Place 1 spray into both nostrils 2 (two) times daily. Use in each  nostril as directed 30 mL Stuart Vernell Norris, PA-C   predniSONE  (DELTASONE ) 20 MG tablet Take 2 tablets (40 mg total) by mouth daily with breakfast. 6 tablet Stuart Vernell Norris, [place]      PDMP not reviewed this encounter.   Stuart Vernell Norris, [place] 09/10/23 1949

## 2023-09-10 NOTE — Discharge Instructions (Signed)
 In addition to the prescribed medications, you may use Coricidin HBP, antihistamines, saline sinus rinses, humidifiers.  I have also sent in wax softening drops and you may use warm water lavage at home gently to remove the wax plugs in both ears

## 2023-09-12 ENCOUNTER — Encounter (HOSPITAL_COMMUNITY): Payer: PRIVATE HEALTH INSURANCE | Admitting: Physical Therapy

## 2023-09-16 ENCOUNTER — Ambulatory Visit (INDEPENDENT_AMBULATORY_CARE_PROVIDER_SITE_OTHER): Payer: Medicare Other | Admitting: Family Medicine

## 2023-09-16 VITALS — BP 114/62 | HR 96 | Temp 98.5°F | Ht 66.0 in | Wt 160.1 lb

## 2023-09-16 DIAGNOSIS — J019 Acute sinusitis, unspecified: Secondary | ICD-10-CM | POA: Insufficient documentation

## 2023-09-16 DIAGNOSIS — J329 Chronic sinusitis, unspecified: Secondary | ICD-10-CM | POA: Diagnosis not present

## 2023-09-16 DIAGNOSIS — H6123 Impacted cerumen, bilateral: Secondary | ICD-10-CM

## 2023-09-16 DIAGNOSIS — H6120 Impacted cerumen, unspecified ear: Secondary | ICD-10-CM | POA: Insufficient documentation

## 2023-09-16 DIAGNOSIS — J0100 Acute maxillary sinusitis, unspecified: Secondary | ICD-10-CM

## 2023-09-16 MED ORDER — AMOXICILLIN 875 MG PO TABS: 875.0000 mg | ORAL_TABLET | Freq: Two times a day (BID) | ORAL | 0 refills | Status: AC

## 2023-09-16 MED ORDER — FLUCONAZOLE 150 MG PO TABS: 150.0000 mg | ORAL_TABLET | Freq: Every day | ORAL | 0 refills | Status: DC

## 2023-09-16 NOTE — Progress Notes (Addendum)
 Patient Office Visit  Assessment & Plan:  Bilateral impacted cerumen -     Ear Lavage -     Ambulatory referral to ENT  Sinusitis, unspecified chronicity, unspecified location  Acute maxillary sinusitis, recurrence not specified -     Amoxicillin ; Take 1 tablet (875 mg total) by mouth 2 (two) times daily for 10 days.  Dispense: 20 tablet; Refill: 0 -     Fluconazole ; Take 1 tablet (150 mg total) by mouth daily.  Dispense: 2 tablet; Refill: 0   Ear lavage was performed and we got a lot of earwax from the right ear.  Patient would rather see ENT to get the rest out.  ENT consult ordered in the [place] area.  Patient will let us  know if no improvement or worsening of symptoms.  Patient may benefit from physical therapy to help with BPV symptoms Return if symptoms worsen or fail to improve.   Subjective:    Patient ID: April Beck, female    DOB: 01/29/1946  Age: 78 y.o. MRN: [id number]  Chief Complaint  Patient presents with   Fatigue   Nasal Congestion    HPI Pt has been sick since before x-mas (December 23rd).  Possible Sinusitis- having congestion, sinus pressure, ear feel clogged. Went to [facility] 7th [facility]) and got Coricidin, ear drops and Atrovent nasal spray. Pt is not doing better despite taking all these medications. Pt feels both ears are clogged and cannot hear very well. Pt was told to take OTC meds She is not getting better with low energy.  No fever or chills, no nausea or vomiting. Atrovent does help some.  Pt  may be short winded but thinks it's because she cannot breathe through her nose.  Not having any  wheezing or cough but thinks she is having postnasal drip.  No tobacco. No ill contacts. Pt feels like she's getting worse.Lost 5 pounds since last OV due to decreased appetite. Pt trying to drink water and had tea yesterday. Ate Ok yesterday. But not today.  Pt has been Rx prednisone  and Antivert  before for BPV and did feel better but symptoms do not go  away completely.  Bilateral Cerumen Impaction: Patient presents with diminished hearing for the past a few weeks. Or longer of both ears. She has  prior history of cerumen impaction and BPV. .  The patient was using ear drops to loosen wax this past week. Pt wonders if her clogged ears contributing to BPV. Pt does not use Q tips. No drainage from the ear.  Used ear drops for a couple days but did not help. Pt cannot hear very well and is frustrating.  The ASCVD Risk score (Arnett DK, et al., 2019) failed to calculate for the following reasons:   The valid total cholesterol range is 130 to 320 mg/dL     ROS    Objective:    BP 114/62 (BP Location: Left Arm)   Pulse 96   Temp 98.5 F (36.9 C)   Ht 5' 6 (1.676 m)   Wt 160 lb 2 oz (72.6 kg)   SpO2 97%   BMI 25.84 kg/m  BP Readings from Last 3 Encounters:  09/16/23 114/62  09/10/23 108/75  09/05/23 138/82   Wt Readings from Last 3 Encounters:  09/16/23 160 lb 2 oz (72.6 kg)  09/05/23 165 lb (74.8 kg)  07/16/23 163 lb 9.6 oz (74.2 kg)    Physical Exam Vitals and nursing note reviewed.  Constitutional:  Appearance: Normal appearance.  HENT:     Head: Normocephalic.     Right Ear: There is impacted cerumen.     Left Ear: Tympanic membrane, ear canal and external ear normal. There is impacted cerumen.     Ears:     Comments: Pt does have narrow canals. After ear lavage left TM normal (pt felt better) Right TM still obscured by cerumen (I was able to remove manually with curette quite a bit of wax from the right ear (pt had discomfort initially). I still could not see right TM after lavage.     Nose: Congestion present.     Mouth/Throat:     Pharynx: No oropharyngeal exudate.  Eyes:     Extraocular Movements: Extraocular movements intact.     Conjunctiva/sclera: Conjunctivae normal.     Pupils: Pupils are equal, round, and reactive to light.  Cardiovascular:     Rate and Rhythm: Normal rate and regular rhythm.      Heart sounds: Normal heart sounds.  Pulmonary:     Effort: Pulmonary effort is normal.     Breath sounds: No wheezing.  Neurological:     General: No focal deficit present.     Mental Status: She is alert and oriented to person, place, and time.  Psychiatric:        Mood and Affect: Mood normal.        Behavior: Behavior normal.        Thought Content: Thought content normal.        Judgment: Judgment normal.      No results found for any visits on 09/16/23.

## 2023-09-17 ENCOUNTER — Encounter (INDEPENDENT_AMBULATORY_CARE_PROVIDER_SITE_OTHER): Payer: Self-pay | Admitting: Nurse Practitioner

## 2023-09-17 ENCOUNTER — Ambulatory Visit (INDEPENDENT_AMBULATORY_CARE_PROVIDER_SITE_OTHER): Payer: Medicare Other | Admitting: Nurse Practitioner

## 2023-09-17 VITALS — BP 147/70 | HR 90 | Wt 155.8 lb

## 2023-09-17 DIAGNOSIS — E119 Type 2 diabetes mellitus without complications: Secondary | ICD-10-CM

## 2023-09-17 LAB — POCT HEMOGLOBIN A1C: POCT Hgb A1C: 7.2 % — AB (ref 3.9–5.9)

## 2023-09-17 MED ORDER — GLUCAGON EMERGENCY 1 MG IJ KIT: 1.0000 mg | PACK | Freq: Once | INTRAMUSCULAR | 1 refills | Status: AC | PRN

## 2023-09-17 MED ORDER — METFORMIN HCL ER 500 MG PO TB24
500.0000 mg | ORAL_TABLET | Freq: Two times a day (BID) | ORAL | Status: DC
Start: 2023-09-17 — End: 2023-12-17

## 2023-09-17 NOTE — Progress Notes (Signed)
 Chief Complaint:  Follow up DM II    HPI:  Helen [name] is a 78 y.o. female with PMH Asthma, HTN, diabetes  who presents for follow up management of  type 2 diabetes.  Diabetes type 2 was diagnosed in 1991 on routine testing, and has been managed by PCP.      In terms of severity with regards to complications, the patient does not have retinopathy, does not have nephropathy, does not have neuropathy sees podiatrist for callyces every one to two weeks, and does not have cardiovascular disease.                 With regards to the quality of blood sugar control, the most recent hemoglobin A1c test was 7.2% on 09/17/23, 7.5% on 03/15/23,  7.6 on 11/14/22.  The patient checks blood sugars one to two times per day  Blood sugar numbers have improved.  Current anti-hyperglycemic regimen is comprised of Tradjenta  5 mg daily, metformin  1000 mg bid, Glipizide  20 mg bid, Jardiance  25 mg daily .                 In terms of associated symptoms at this time, the patient does not have polyuria, does not have blurred vision, does not have paresthesias,  With regards to gastroparesis the patient does not have nausea and does not have diarrhea     Per the patient's history, last fundoscopic exam was in May 2024 and usually gets notice from Ophthalmology office for her next appointment. 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      .   Currently the patient's diet comprises of 2 meals per day.  Prior formal diabetes education/ nutritional counseling has not been performed.       Patient has not experienced any hypoglycemia symptoms or events during the previous months.     Diabetes Medications:  Metformin  1000 mg twice daily  Glipizide  20 mg twice daily  Jardiance  25 mg daily  Tradjenta  5 mg daily    Diabetes Complications:  Podiatry: no neuropathy  Renal: None   Lab Results   Component Value Date    CREAT 0.87 11/14/2022    CREAT 0.86 05/24/2022      Latest Reference Range & Units  11/14/22 00:00   Creatinine, UR Not Estab. mg/dL 62.0     Ophthalmology: no retinopathy, no glaucoma, + cataracts, last visit in May 2024.   Cardiovascular: none    Lipids: TC 138 trig 58, LDL 62, HDL 64   Latest Reference Range & Units 11/14/22 00:00   Cholesterol 100 - 199 mg/dL 870   HDL >60 mg/dL 67   LDL Calculated 0 - 99 mg/dL 51   Triglycerides 0 - 149 mg/dL 47   Cholesterol / HDL Ratio 0.0 - 4.4 ratio 1.9   VLDL Calculated 5 - 40 mg/dL 11     Blood pressure: 147/70    Self-monitoring blood glucose:    Fasting: 120, 130, 129, 126, 132, 119, 134, 145, 129, 117, 124, 140, 97  After meals: 129, 135, 140, 125, 119, 128, 116, 124, 139, 125, 148, 134, 145    24 Hour diet recall:  1) Breakfast: Scrambled eggs, turkey bacon and piece of wheat toast   2) Lunch: skip lunch, in the afternoon yogurt and piece of fruit like strawberries and grapes   3) Dinner: Fish and sweet potatoes, vegetables green beans and broccoli   Snacks: Granola bar with no  sugar and cholesterol reading labels   Beverages: Water, lemonade with no sugar, tea, decaf coffee with 1% milk     Exercise:   Regular exercise walking three times a week 1 hour each time      Labs:  Lab Results   Component Value Date    HGBA1C 7.5 (A) 03/15/2023    HGBA1C 7.6 (H) 11/14/2022    HGBA1C 7.8 (H) 05/24/2022          Problem List:  Patient Active Problem List   Diagnosis    Hypertension    Hyperlipidemia    H/O total hysterectomy with removal of both tubes and ovaries    Heart block AV first degree    RBBB (right bundle branch block)    Palpitations    Pure hypercholesterolemia, unspecified    Nonspecific abnormal electrocardiogram (ECG) (EKG)    Type 2 diabetes mellitus without complication, without long-term current use of insulin     Type 2 diabetes mellitus with hyperglycemia    Anemia    History of colonic polyps    Gastroesophageal reflux disease, unspecified whether esophagitis present       Current Medications:  Current Outpatient Medications on File Prior  to Visit   Medication Sig Dispense Refill    albuterol  (PROVENTIL ) (2.5 MG/3ML) 0.083% nebulizer solution TAKE 3 MLS (2.5 MG TOTAL) BY NEBULIZATION EVERY 4 (FOUR) HOURS AS NEEDED FOR SHORTNESS OF BREATH OR WHEEZING 75 mL 1    albuterol  sulfate HFA (PROVENTIL ) 108 (90 Base) MCG/ACT inhaler INHALE 2 PUFFS INTO THE LUNGS EVERY 4 HOURS AS NEEDED FOR WHEEZING OR SHORTNESS OF BREATH. 6.7 each 2    amLODIPine  (NORVASC ) 10 MG tablet TAKE 1 TABLET (10 MG) BY MOUTH DAILY. 90 tablet 3    atorvastatin  (LIPITOR) 40 MG tablet TAKE 1 TABLET BY MOUTH EVERY DAY 90 tablet 3    benzonatate  (TESSALON ) 100 MG capsule Take 1 capsule (100 mg) by mouth 3 (three) times daily as needed for Cough 30 capsule 0    Blood Glucose Monitoring Suppl (ONE TOUCH ULTRA 2) W/DEVICE KIT One daily 1 each 0    Cholecalciferol (Vitamin D3) 2000 UNIT capsule Take 1 capsule (2,000 Units) by mouth daily 90 capsule 1    dexAMETHasone (DECADRON) 0.1 % ophthalmic solution Place 1 drop into both eyes      empagliflozin  (Jardiance ) 25 MG tablet Take 1 tablet (25 mg) by mouth every morning 90 tablet 1    EPINEPHrine  (EPIPEN  2-PAK) 0.3 MG/0.3ML Solution Auto-injector injection Inject 0.3 mLs (0.3 mg) into the muscle once as needed (Anaphylaxis) 1 each 3    EPINEPHrine  0.3 MG/0.3ML auto-injector 1 DOSE IF NEEDED FOR ANAPHYLAXIS      ferrous sulfate 325 (65 FE) MG tablet Take 1 tablet (325 mg) by mouth every morning with breakfast      fluticasone  (FLONASE ) 50 MCG/ACT nasal spray 2 sprays by Nasal route daily      fluticasone  (FLOVENT  HFA) 220 MCG/ACT inhaler INHALE 1 PUFF INTO THE LUNGS TWICE A DAY 24 each 2    glipiZIDE  (GLUCOTROL ) 10 MG tablet Take 2 tablets (20 mg) by mouth 2 (two) times daily before meals 360 tablet 1    glucose blood (OneTouch Ultra) test strip 2 times daily 200 each 3    Lancets (onetouch ultrasoft) lancets Use to check blood sugars 2 times daily 200 each 3    lidocaine  (Lidoderm ) 5 % Place 1 patch onto the skin daily as needed (pain) Remove &  Discard patch within  12 hours 30 patch 0    metFORMIN  (GLUCOPHAGE -XR) 500 MG 24 hr tablet TAKE 2 TABLETS (1,000 MG) BY MOUTH 2 (TWO) TIMES DAILY 360 tablet 0    NASONEX 50 MCG/ACT nasal spray 2 sprays by Nasal route daily as needed  5    pantoprazole  (PROTONIX ) 40 MG tablet TAKE 1 TABLET BY MOUTH EVERY DAY 90 tablet 1    Tradjenta  5 MG Tab TAKE 1 TABLET BY MOUTH EVERY DAY 90 tablet 1    valsartan -hydroCHLOROthiazide  (DIOVAN -HCT) 160-12.5 MG per tablet TAKE 1 TABLET BY MOUTH EVERY DAY 90 tablet 1    montelukast (SINGULAIR) 10 MG tablet Take 1 tablet (10 mg) by mouth daily (Patient not taking: Reported on 09/17/2023)       No current facility-administered medications on file prior to visit.       Allergies:  Allergies   Allergen Reactions    Aspirin      Break out in sweat and dizziness     Other Environmental     Peppers     Pollen Extract     Latex Itching and Rash       Past Medical History:  Past Medical History:   Diagnosis Date    Anemia     iron    currently on medication      Arthritis     general body      Arthritis     Generalized    Asthma     Asthma without status asthmaticus     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    Bilateral cataracts     Breast cancer     Breast lump     Cancer     breast cancer    Carcinoma     Carcinoma of the bladder    Chicken pox     Childhood illness    Conduction Disorder     RBBB    Diabetes mellitus type II     Non-Insulin  dependent    Echocardiogram 10/2013    Gastroesophageal reflux disease     Hemorrhoids without complication     Holter monitor 02/2014    Hyperlipidemia     Hypertensive disorder     Malignant neoplasm of breast     Measles     Childhood illness    Mumps     Childhood illness    Myocardial perfusion scan 11/2013, 12/2013    MPI single Isotope excrise; MPI dual Isotope Lexiscan     RBBB     Spinal stenosis     Syncope        Past Surgical History:  Past Surgical History:   Procedure Laterality Date    ABLATION OF DYSRHYTHMIC FOCUS       ARTHROSCOPY, KNEE Left 04/21/2015    Procedure: ARTHROSCOPY, KNEE PARTIAL MEDIAL AND LATERAL MENISECTOMY LEFT KNEE;  Surgeon: Evie Shlomo CROME, MD;  Location: [facility];  Service: Orthopedics;  Laterality: Left;  partial medial menisectomy    BREAST BIOPSY      COLONOSCOPY, SCREENING N/A 12/07/2022    Procedure: COLONOSCOPY, SCREENING;  Surgeon: Vernia Mabel HERO, MD;  Location: [facility];  Service: Gastroenterology;  Laterality: N/A;    EGD N/A 12/07/2022    Procedure: EGD;  Surgeon: Vernia Mabel HERO, MD;  Location: [facility];  Service: Gastroenterology;  Laterality: N/A;    HYSTERECTOMY      total    TUMOR REMOVAL Left     neck  benign        Family History:  Family History   Problem Relation Name Age of Onset    Diabetes Mother Ardine Iacovelli     Diabetes Sister Tamra Clyburn     Diabetes Sister Reena Oris     Malignant hyperthermia Neg Hx      Pseudochol deficiency Neg Hx      Anesthesia problems Neg Hx         Social History:  Social History     Socioeconomic History    Marital status: Divorced   Tobacco Use    Smoking status: Never     Passive exposure: Never    Smokeless tobacco: Never   Vaping Use    Vaping status: Never Used   Substance and Sexual Activity    Alcohol use: No    Drug use: Never    Sexual activity: Not Currently     Social Drivers of Health     Financial Resource Strain: High Risk (10/16/2022)    Overall Financial Resource Strain (CARDIA)     Difficulty of Paying Living Expenses: Hard   Food Insecurity: No Food Insecurity (12/07/2022)    Hunger Vital Sign     Worried About Running Out of Food in the Last Year: Never true     Ran Out of Food in the Last Year: Never true   Recent Concern: Food Insecurity - Food Insecurity Present (10/16/2022)    Hunger Vital Sign     Worried About Running Out of Food in the Last Year: Often true     Ran Out of Food in the Last Year: Often true   Transportation Needs: No Transportation Needs (12/07/2022)    PRAPARE - Therapist, Art  (Medical): No     Lack of Transportation (Non-Medical): No   Physical Activity: Insufficiently Active (10/16/2022)    Exercise Vital Sign     Days of Exercise per Week: 3 days     Minutes of Exercise per Session: 30 min   Stress: No Stress Concern Present (10/16/2022)    [employer] of Occupational Health - Occupational Stress Questionnaire     Feeling of Stress : Not at all   Recent Concern: Stress - Stress Concern Present (09/06/2022)    [employer] - Occupational Stress Questionnaire     Feeling of Stress : Very much   Social Connections: Moderately Integrated (10/16/2022)    Social Connection and Isolation Panel [NHANES]     Frequency of Communication with Friends and Family: Three times a week     Frequency of Social Gatherings with Friends and Family: Once a week     Attends Religious Services: More than 4 times per year     Active Member of [employer] or Organizations: Yes     Attends Banker Meetings: Never     Marital Status: Divorced   Recent Concern: Social Connections - Moderately Isolated (09/06/2022)    Social Connection and Isolation Panel [NHANES]     Frequency of Communication with Friends and Family: Three times a week     Frequency of Social Gatherings with Friends and Family: Twice a week     Attends Religious Services: More than 4 times per year     Active Member of Clubs or Organizations: No     Attends Banker Meetings: Patient declined     Marital Status: Separated   Intimate Partner Violence: Not At Risk (12/07/2022)  Humiliation, Afraid, Rape, and Kick questionnaire     Fear of Current or Ex-Partner: No     Emotionally Abused: No     Physically Abused: No     Sexually Abused: No   Housing Stability: Low Risk  (12/07/2022)    Housing Stability Vital Sign     Unable to Pay for Housing in the Last Year: No     Number of Places Lived in the Last Year: 1     Unstable Housing in the Last Year: No       There were no vitals taken for this visit.        BP Readings from Last 3 Encounters:   07/31/23 128/71   07/11/23 130/70   05/28/23 122/66        Wt Readings from Last 4 Encounters:   07/31/23 1321 72.2 kg (159 lb 3.2 oz)   07/11/23 1125 70.3 kg (155 lb)   05/28/23 1105 72 kg (158 lb 12.8 oz)   03/15/23 1148 71.7 kg (158 lb)       Physical Exam:  GENERAL APPEARANCE: alert, in no acute distress, well developed, well nourished  PSYCHIATRIC: normal mood, appropriate affect  Pulmonary: Breathing normally with no wheezes  Neuro: A/O X 3  NECK/THYROID : neck supple, no cervical lymphadenopathy, no thyromegaly, no palpable thyroid  nodules  HEART: S1, S2 normal, no murmurs, regular rate and rhythm  LUNGS: clear to auscultation bilaterally, no wheezes, rales, rhonchi  ABDOMEN: soft, NTND, nl BS, no hepatomegaly, no splenomegaly  EXTREMITIES: no clubbing, cyanosis, or edema B/L  NEUROLOGY: normal UE reflexes b/l, no hand tremor b/l  SKIN: No acanthosis nigricans, no purple striae, no hirsutism  PSYCHIATRIC: normal mood, appropriate affect  DIABETIC FOOT EXAM:   Sensation: Intact with monofilament  Appearance: no cuts, wounds, no onychomycosis, normal appearing toenails, calyces   Pulses: 2+ DP and PT B/L      Assessment/Plan:  SATCHA STORLIE is a 78 y.o. female with Diabetes Mellitus Type II without long term current use of Insulin  with no complications     1. Controlled type 2 diabetes mellitus without complication, without long-term current use of insulin   Diabetes type 2:    Based on blood sugar pattern of descent fasting sugars and descent prandial/post-prandial sugars will continue medications as follows: continue metformin  1000 mg bid with meals, Continue Glipizide  20 mg bid, Continue Tradjenta  5 mg daily and Jardiance  25 mg daily.  Check sugars AM fasting and 2 hours after biggest meal of day with goal fasting/pre-meal range of 90-130; goal postprandial range of 90-160.  Is due for eye exam in coming months;  Continue to see podiatry for foot care.  Continue  controlled carbohydrate diet and exercise daily.   Bring blood sugar log to the next appointment     2. Hypertension (stable): goal blood pressure is <130/80.    Continue Valsartan - HCT 160-12.5 mg daily  Continue Amlodipine  10 mg daily  Follow up with PCP     3. Hyperlipidemia (stable): controlled on Lipitor 40 mg daily; goal LDL<100.  Continue low cholesterol, low saturated fat diet.  Continue Lipitor 40 mg daily     4. Overweight status:  counseled patient on at least 150 min/wk of moderate-intensity exercise and consider flexibility/strength training exercises if possible.  Physical activity programs should begin slowly and build up gradually; nutritional counseling status.     5. Hypoglycemia management : counseled patient on need to keep sugar source with her at all times and  medic-alert bracelet/indicator specifying diagnosis of diabetes  Educated patient about hypoglycemia symptoms and strategies for management of hypoglycemia  Updated with Glucagon  emergency kit         Follow up in 4 to 6 months.     Jalaine JINNY Cruel, NP

## 2023-09-17 NOTE — Patient Instructions (Signed)
 Continue Metformin  500 mg twice daily  Continue Jardiance  25 mg daily  Continue Glipizide  20 mg twice daily  Continue Tradjenta  5 mg daily  Do labs  Follow up in 4 to 6 months    I provided the patient information on carbohydrate content of foods, instructed to maintain a low carbohydrate diet of 30-45g/meal, + 30g for snacks /day, and to maximize foods high in protein and vegetables.  I discussed with the patient blood glucose targets, HgbA1c goals, and the importance of timing of SMBGs at home.  I discussed medication treatment options, insulin  injection technique, and insulin  action.  Finally, I reviewed with the patient hypoglycemia (BG < 70), hypoglycemic symptoms and management of hypoglycemia (rule of 15s).  HOW TO TREAT HYPOGLYCEMIA (LOW BLOOD SUGAR) LESS THAN 70 MG/DL:  Follow rule of 15 for treating low blood sugar less than 70 mg/dl.     1.) Take a 15 gram rapidly acting carbohydrates in the form of half cup fruit juice or 3-4 glucose tablets or 1 tube of glucose gel, available over the counter at the pharmacy  2.) Check blood sugar in 15 minutes  3.) If blood sugar is less than 70 mg/dl, repeat the rule of 15 as in step 1.  4.) Once blood sugar is greater than 70 mg/dl, eat a balanced meal or a snack with starch, protein and fat. For example, 4-5 peanut butter crackers or 4-5 cheese crackers or fiber bar.     NOTIFY US  - if you continue to have blood sugars less than 70 mg/dl.

## 2023-09-18 ENCOUNTER — Other Ambulatory Visit (INDEPENDENT_AMBULATORY_CARE_PROVIDER_SITE_OTHER): Payer: Medicare Other

## 2023-09-18 ENCOUNTER — Ambulatory Visit (INDEPENDENT_AMBULATORY_CARE_PROVIDER_SITE_OTHER): Payer: Medicare Other

## 2023-09-18 DIAGNOSIS — E119 Type 2 diabetes mellitus without complications: Secondary | ICD-10-CM

## 2023-09-18 DIAGNOSIS — I10 Essential (primary) hypertension: Secondary | ICD-10-CM

## 2023-09-20 LAB — COMPREHENSIVE METABOLIC PANEL
ALT: 16 [IU]/L (ref 0–32)
AST (SGOT): 22 [IU]/L (ref 0–40)
Albumin: 4.4 g/dL (ref 3.8–4.8)
Alkaline Phosphatase: 85 [IU]/L (ref 44–121)
BUN / Creatinine Ratio: 20 (ref 12–28)
BUN: 20 mg/dL (ref 8–27)
Bilirubin, Total: 0.4 mg/dL (ref 0.0–1.2)
CO2: 21 mmol/L (ref 20–29)
Calcium: 9.5 mg/dL (ref 8.7–10.3)
Chloride: 101 mmol/L (ref 96–106)
Creatinine: 1.02 mg/dL — ABNORMAL HIGH (ref 0.57–1.00)
Globulin, Total: 2.9 g/dL (ref 1.5–4.5)
Glucose: 126 mg/dL — ABNORMAL HIGH (ref 70–99)
Potassium: 4.9 mmol/L (ref 3.5–5.2)
Protein, Total: 7.3 g/dL (ref 6.0–8.5)
Sodium: 143 mmol/L (ref 134–144)
eGFR: 57 mL/min/{1.73_m2} — ABNORMAL LOW (ref >59)

## 2023-09-20 LAB — LIPID PANEL
Cholesterol / HDL Ratio: 2.1 {ratio} (ref 0.0–4.4)
Cholesterol: 146 mg/dL (ref 100–199)
HDL: 68 mg/dL (ref >39)
LDL Chol Calculated (NIH): 66 mg/dL (ref 0–99)
Triglycerides: 55 mg/dL (ref 0–149)
VLDL Calculated: 12 mg/dL (ref 5–40)

## 2023-09-20 LAB — URINE MICROALBUMIN, RANDOM
Creatinine, UR: 43.1 mg/dL
Microalb/Crt. Ratio: 35 mg/g{creat} — ABNORMAL HIGH (ref 0–29)
Microalbumin, UR: 15 ug/mL

## 2023-09-23 LAB — SPECIMEN STATUS REPORT

## 2023-09-24 ENCOUNTER — Other Ambulatory Visit (INDEPENDENT_AMBULATORY_CARE_PROVIDER_SITE_OTHER): Payer: Self-pay | Admitting: Nurse Practitioner

## 2023-09-24 DIAGNOSIS — E1165 Type 2 diabetes mellitus with hyperglycemia: Secondary | ICD-10-CM

## 2023-09-24 MED ORDER — EMPAGLIFLOZIN 25 MG PO TABS
25.0000 mg | ORAL_TABLET | Freq: Every morning | ORAL | 1 refills | Status: AC
Start: 2023-09-24 — End: ?

## 2023-09-24 NOTE — Telephone Encounter (Signed)
 Last Visit:  09/17/2023    ICD:   E11.9    Due for f/u:  02/15/2024    Last lab:  09/18/2023

## 2023-10-10 DIAGNOSIS — H6123 Impacted cerumen, bilateral: Secondary | ICD-10-CM | POA: Diagnosis not present

## 2023-10-10 DIAGNOSIS — H902 Conductive hearing loss, unspecified: Secondary | ICD-10-CM | POA: Diagnosis not present

## 2023-10-15 ENCOUNTER — Ambulatory Visit: Payer: PRIVATE HEALTH INSURANCE | Admitting: Family Medicine

## 2023-10-15 ENCOUNTER — Ambulatory Visit (INDEPENDENT_AMBULATORY_CARE_PROVIDER_SITE_OTHER): Payer: Self-pay

## 2023-10-15 DIAGNOSIS — E119 Type 2 diabetes mellitus without complications: Secondary | ICD-10-CM

## 2023-10-15 DIAGNOSIS — I10 Essential (primary) hypertension: Secondary | ICD-10-CM

## 2023-10-24 ENCOUNTER — Ambulatory Visit
Admission: RE | Admit: 2023-10-24 | Discharge: 2023-10-24 | Disposition: A | Discharge status: Home / Self Care | Payer: Medicare Other | Source: Ambulatory Visit | Attending: Family | Admitting: Family

## 2023-10-24 DIAGNOSIS — Z780 Asymptomatic menopausal state: Secondary | ICD-10-CM | POA: Insufficient documentation

## 2023-10-24 DIAGNOSIS — M85859 Other specified disorders of bone density and structure, unspecified thigh: Secondary | ICD-10-CM | POA: Insufficient documentation

## 2023-10-28 ENCOUNTER — Ambulatory Visit (INDEPENDENT_AMBULATORY_CARE_PROVIDER_SITE_OTHER): Payer: Medicare Other | Admitting: Family

## 2023-10-28 ENCOUNTER — Encounter (INDEPENDENT_AMBULATORY_CARE_PROVIDER_SITE_OTHER): Payer: Self-pay | Admitting: Family

## 2023-10-28 VITALS — BP 138/72 | HR 86

## 2023-10-28 DIAGNOSIS — L299 Pruritus, unspecified: Secondary | ICD-10-CM

## 2023-10-28 DIAGNOSIS — G8929 Other chronic pain: Secondary | ICD-10-CM

## 2023-10-28 DIAGNOSIS — M25551 Pain in right hip: Secondary | ICD-10-CM

## 2023-10-28 DIAGNOSIS — N182 Chronic kidney disease, stage 2 (mild): Secondary | ICD-10-CM

## 2023-10-28 DIAGNOSIS — M8580 Other specified disorders of bone density and structure, unspecified site: Secondary | ICD-10-CM | POA: Insufficient documentation

## 2023-10-28 DIAGNOSIS — R809 Proteinuria, unspecified: Secondary | ICD-10-CM

## 2023-10-28 DIAGNOSIS — Z780 Asymptomatic menopausal state: Secondary | ICD-10-CM

## 2023-10-29 ENCOUNTER — Encounter (INDEPENDENT_AMBULATORY_CARE_PROVIDER_SITE_OTHER): Payer: Self-pay | Admitting: Family

## 2023-10-29 DIAGNOSIS — N182 Chronic kidney disease, stage 2 (mild): Secondary | ICD-10-CM

## 2023-10-29 HISTORY — DX: Chronic kidney disease, stage 2 (mild): N18.2

## 2023-10-29 LAB — CBC AND DIFFERENTIAL
Baso(Absolute): 0.1 10*3/uL (ref 0.0–0.2)
Basophils Automated: 1 %
Eosinophils Absolute: 0 10*3/uL (ref 0.0–0.4)
Eosinophils Automated: 0 %
Hematocrit: 40.8 % (ref 34.0–46.6)
Hemoglobin: 12.8 g/dL (ref 11.1–15.9)
Immature Granulocytes Absolute: 0 10*3/uL (ref 0.0–0.1)
Immature Granulocytes: 0 %
Lymphocytes Absolute: 1.5 10*3/uL (ref 0.7–3.1)
Lymphocytes Automated: 31 %
MCH: 23.4 pg — ABNORMAL LOW (ref 26.6–33.0)
MCHC: 31.4 g/dL — ABNORMAL LOW (ref 31.5–35.7)
MCV: 75 fL — ABNORMAL LOW (ref 79–97)
Monocytes Absolute: 0.5 10*3/uL (ref 0.1–0.9)
Monocytes: 10 %
Neutrophils Absolute Count: 2.8 10*3/uL (ref 1.4–7.0)
Neutrophils: 58 %
Platelets: 243 10*3/uL (ref 150–450)
RBC: 5.47 x10E6/uL — ABNORMAL HIGH (ref 3.77–5.28)
RDW: 13.7 % (ref 11.7–15.4)
WBC: 4.9 10*3/uL (ref 3.4–10.8)

## 2023-10-29 LAB — URINE MICROALBUMIN, RANDOM
Creatinine, UR: 31.5 mg/dL
Microalb/Crt. Ratio: 43 mg/g{creat} — ABNORMAL HIGH (ref 0–29)
Microalbumin, UR: 13.7 ug/mL

## 2023-10-29 LAB — COMPREHENSIVE METABOLIC PANEL
ALT: 18 [IU]/L (ref 0–32)
AST (SGOT): 24 [IU]/L (ref 0–40)
Albumin: 4.4 g/dL (ref 3.8–4.8)
Alkaline Phosphatase: 85 [IU]/L (ref 44–121)
BUN / Creatinine Ratio: 19 (ref 12–28)
BUN: 17 mg/dL (ref 8–27)
Bilirubin, Total: 0.8 mg/dL (ref 0.0–1.2)
CO2: 23 mmol/L (ref 20–29)
Calcium: 9.7 mg/dL (ref 8.7–10.3)
Chloride: 100 mmol/L (ref 96–106)
Creatinine: 0.89 mg/dL (ref 0.57–1.00)
Globulin, Total: 2.8 g/dL (ref 1.5–4.5)
Glucose: 181 mg/dL — ABNORMAL HIGH (ref 70–99)
Potassium: 4.1 mmol/L (ref 3.5–5.2)
Protein, Total: 7.2 g/dL (ref 6.0–8.5)
Sodium: 137 mmol/L (ref 134–144)
eGFR: 67 mL/min/{1.73_m2} (ref >59)

## 2023-10-29 LAB — THYROID STIMULATING HORMONE (TSH) WITH REFLEX TO FREE T4: TSH: 0.975 u[IU]/mL (ref 0.450–4.500)

## 2023-10-29 NOTE — Progress Notes (Signed)
 [facility]  [place]  PROGRESS NOTE      Patient: Helen [name]   Date: 10/29/2023   MRN: [id number]     Helen [name] is a 78 y.o. female    Chief Complaint   Patient presents with    Follow-up       MEDICATIONS     Current Medications[1]    Allergies[2]    SUBJECTIVE     Chief Complaint   Patient presents with    Follow-up        HPI    1) 78yo female with Hx DMII, HTN, GERD, HLD. Presents today for follow up labs regarding her renal function. Pt seen by endo 1 month ago and GFR decreased to 57, lower than baseline 69-70s and creatinine increased at 1.02, higher than baseline 0.8. BUN remained within normal range. + microalbuminuria.     A1C improving, most recent 7.2. Blood pressures have been well controlled.    2) DEXA. Recent DEXA showing osteopenia. Lowest T-score -1.9 in femoral neck.    3) Chronic hip pain. Pt has had pain for years. Last imaging was on left hip in 2 years ago, showing mild degenerative changes. Now concerned for right hip pain today.    4) pt also reports she is seeing her allergist tomorrow due to concern of itching for the past month. States everything from her eyes to ears to body has been itching constantly. States she will get hive like rash on and off.         ROS     Review of Systems   Constitutional:  Negative for unexpected weight change.   Respiratory:  Negative for shortness of breath.    Cardiovascular:  Negative for chest pain and leg swelling.   Gastrointestinal:  Negative for abdominal pain.   Genitourinary: Negative.    Musculoskeletal:  Positive for arthralgias, back pain and gait problem.   Neurological:  Negative for dizziness and headaches.     The following portions of the patient's history were reviewed and updated as appropriate: Allergies, Current Medications, Past Family History, Past Medical history, Past social history, Past surgical history, and Problem List.    PHYSICAL EXAM     Vitals:    10/28/23 1110   BP: 138/72   BP Site: Left arm   Patient  Position: Sitting   Cuff Size: Medium   Pulse: 86   SpO2: 97%           Physical Exam  Vitals and nursing note reviewed.   Constitutional:       General: She is awake. She is not in acute distress.     Appearance: Normal appearance.   Pulmonary:      Effort: Pulmonary effort is normal.   Neurological:      Mental Status: She is alert and oriented to person, place, and time.   Psychiatric:         Mood and Affect: Mood normal.         Speech: Speech normal.         Behavior: Behavior normal.             ASSESSMENT/PLAN        1. Stage 2 chronic kidney disease  Chronic. Repeat labs today. Continue with adequate DMII and HTN management. Avoid NSAID use. Consider modifying GERD management (long term PPI use). Consider nephrology consult.      2. Microalbuminuria  Basic Metabolic Panel    Urine Microalbumin,  Random    Urine Microalbumin, Random    Comprehensive Metabolic Panel    Comprehensive Metabolic Panel    CANCELED: Basic Metabolic Panel      3. Pruritus  Thyroid  Stimulating Hormone (TSH) with Reflex to Free T4    CBC with Differential (Order)    Thyroid  Stimulating Hormone (TSH) with Reflex to Free T4    CBC with Differential (Order)    Acute, unclear etiology. Labs as ordered today with allergist consult tomorrow. F/u closely if persists.      4. Chronic right hip pain  Referral to Orthopedic Surgery ([place])  Chronic, worsening. Referral provided.       5. Osteopenia after menopause  Chronic. Continue with Vit D and calcium  supplementation. Routine weight bearing exercise encouraged. Can repeat DEXA in 2 years and as needed.         Reviewed med use and side effects. Reviewed s/s that would warrant further and/ or immediate medical attention. Pt in agreement with plan and all questions answered.       Risk & Benefits of the new medication(s) were explained to the patient (and family) who verbalized understanding & agreed to the treatment plan. Patient (family) encouraged to contact me/clinical staff with any  questions/concerns      Return in about 3 months (around 01/25/2024) for follow up AND as needed.    Signed,  Laymon LULLA Alert, FNP, FNP  10/29/2023         [1]   Current Outpatient Medications   Medication Sig Dispense Refill    albuterol  (PROVENTIL ) (2.5 MG/3ML) 0.083% nebulizer solution TAKE 3 MLS (2.5 MG TOTAL) BY NEBULIZATION EVERY 4 (FOUR) HOURS AS NEEDED FOR SHORTNESS OF BREATH OR WHEEZING 75 mL 1    albuterol  sulfate HFA (PROVENTIL ) 108 (90 Base) MCG/ACT inhaler INHALE 2 PUFFS INTO THE LUNGS EVERY 4 HOURS AS NEEDED FOR WHEEZING OR SHORTNESS OF BREATH. 6.7 each 2    amLODIPine  (NORVASC ) 10 MG tablet TAKE 1 TABLET (10 MG) BY MOUTH DAILY. 90 tablet 3    atorvastatin  (LIPITOR) 40 MG tablet TAKE 1 TABLET BY MOUTH EVERY DAY 90 tablet 3    Blood Glucose Monitoring Suppl (ONE TOUCH ULTRA 2) W/DEVICE KIT One daily 1 each 0    Cholecalciferol (Vitamin D3) 2000 UNIT capsule Take 1 capsule (2,000 Units) by mouth daily 90 capsule 1    dexAMETHasone (DECADRON) 0.1 % ophthalmic solution Place 1 drop into both eyes      empagliflozin  (Jardiance ) 25 MG tablet Take 1 tablet (25 mg) by mouth every morning 90 tablet 1    EPINEPHrine  (EPIPEN  2-PAK) 0.3 MG/0.3ML Solution Auto-injector injection Inject 0.3 mLs (0.3 mg) into the muscle once as needed (Anaphylaxis) 1 each 3    EPINEPHrine  0.3 MG/0.3ML auto-injector 1 DOSE IF NEEDED FOR ANAPHYLAXIS      ferrous sulfate 325 (65 FE) MG tablet Take 1 tablet (325 mg) by mouth every morning with breakfast      fluticasone  (FLONASE ) 50 MCG/ACT nasal spray 2 sprays by Nasal route daily      fluticasone  (FLOVENT  HFA) 220 MCG/ACT inhaler INHALE 1 PUFF INTO THE LUNGS TWICE A DAY 24 each 2    Glucagon , rDNA, (Glucagon  Emergency) 1 MG Kit Inject 1 mg into the skin once as needed (severe hypoglycemia) 1 kit 1    glucose blood (OneTouch Ultra) test strip 2 times daily 200 each 3    Lancets (onetouch ultrasoft) lancets Use to check blood sugars 2 times daily 200 each  3    lidocaine  (Lidoderm ) 5 %  Place 1 patch onto the skin daily as needed (pain) Remove & Discard patch within 12 hours 30 patch 0    metFORMIN  (GLUCOPHAGE -XR) 500 MG 24 hr tablet Take 1 tablet (500 mg) by mouth 2 (two) times daily      NASONEX 50 MCG/ACT nasal spray 2 sprays by Nasal route daily as needed  5    pantoprazole  (PROTONIX ) 40 MG tablet TAKE 1 TABLET BY MOUTH EVERY DAY 90 tablet 1    Tradjenta  5 MG Tab TAKE 1 TABLET BY MOUTH EVERY DAY 90 tablet 1    valsartan -hydroCHLOROthiazide  (DIOVAN -HCT) 160-12.5 MG per tablet TAKE 1 TABLET BY MOUTH EVERY DAY 90 tablet 1    benzonatate  (TESSALON ) 100 MG capsule Take 1 capsule (100 mg) by mouth 3 (three) times daily as needed for Cough 30 capsule 0    glipiZIDE  (GLUCOTROL ) 10 MG tablet Take 2 tablets (20 mg) by mouth 2 (two) times daily before meals 360 tablet 1    montelukast (SINGULAIR) 10 MG tablet Take 1 tablet (10 mg) by mouth daily (Patient not taking: Reported on 10/28/2023)       No current facility-administered medications for this visit.   [2]   Allergies  Allergen Reactions    Aspirin      Break out in sweat and dizziness     Other Environmental     Peppers     Pollen Extract     Latex Itching and Rash

## 2023-10-30 ENCOUNTER — Encounter (INDEPENDENT_AMBULATORY_CARE_PROVIDER_SITE_OTHER): Payer: Self-pay | Admitting: Family

## 2023-10-31 ENCOUNTER — Encounter: Payer: Self-pay | Admitting: Family Medicine

## 2023-10-31 DIAGNOSIS — Z1283 Encounter for screening for malignant neoplasm of skin: Secondary | ICD-10-CM | POA: Diagnosis not present

## 2023-10-31 DIAGNOSIS — D225 Melanocytic nevi of trunk: Secondary | ICD-10-CM | POA: Diagnosis not present

## 2023-10-31 DIAGNOSIS — D485 Neoplasm of uncertain behavior of skin: Secondary | ICD-10-CM | POA: Diagnosis not present

## 2023-10-31 DIAGNOSIS — X32XXXD Exposure to sunlight, subsequent encounter: Secondary | ICD-10-CM | POA: Diagnosis not present

## 2023-10-31 DIAGNOSIS — L570 Actinic keratosis: Secondary | ICD-10-CM | POA: Diagnosis not present

## 2023-11-04 ENCOUNTER — Other Ambulatory Visit: Payer: Self-pay | Admitting: Family Medicine

## 2023-11-04 ENCOUNTER — Encounter (INDEPENDENT_AMBULATORY_CARE_PROVIDER_SITE_OTHER): Payer: Self-pay | Admitting: Family

## 2023-11-05 ENCOUNTER — Ambulatory Visit (INDEPENDENT_AMBULATORY_CARE_PROVIDER_SITE_OTHER): Payer: Medicare Other | Admitting: Orthopaedic Surgery

## 2023-11-06 ENCOUNTER — Ambulatory Visit: Payer: Medicare Other

## 2023-11-07 ENCOUNTER — Ambulatory Visit (INDEPENDENT_AMBULATORY_CARE_PROVIDER_SITE_OTHER): Admitting: Orthopaedic Surgery

## 2023-11-12 ENCOUNTER — Encounter (INDEPENDENT_AMBULATORY_CARE_PROVIDER_SITE_OTHER): Payer: Self-pay | Admitting: Orthopaedic Surgery

## 2023-11-12 ENCOUNTER — Ambulatory Visit (INDEPENDENT_AMBULATORY_CARE_PROVIDER_SITE_OTHER)

## 2023-11-12 ENCOUNTER — Ambulatory Visit (INDEPENDENT_AMBULATORY_CARE_PROVIDER_SITE_OTHER): Admitting: Orthopaedic Surgery

## 2023-11-12 ENCOUNTER — Ambulatory Visit (INDEPENDENT_AMBULATORY_CARE_PROVIDER_SITE_OTHER): Payer: Self-pay

## 2023-11-12 VITALS — BP 143/73 | HR 74 | Ht 61.0 in | Wt 155.8 lb

## 2023-11-12 DIAGNOSIS — E119 Type 2 diabetes mellitus without complications: Secondary | ICD-10-CM

## 2023-11-12 DIAGNOSIS — M7062 Trochanteric bursitis, left hip: Secondary | ICD-10-CM

## 2023-11-12 DIAGNOSIS — M47816 Spondylosis without myelopathy or radiculopathy, lumbar region: Secondary | ICD-10-CM

## 2023-11-12 DIAGNOSIS — M25552 Pain in left hip: Secondary | ICD-10-CM

## 2023-11-12 DIAGNOSIS — M7061 Trochanteric bursitis, right hip: Secondary | ICD-10-CM

## 2023-11-12 DIAGNOSIS — I10 Essential (primary) hypertension: Secondary | ICD-10-CM

## 2023-11-12 DIAGNOSIS — M25551 Pain in right hip: Secondary | ICD-10-CM

## 2023-11-12 DIAGNOSIS — G8929 Other chronic pain: Secondary | ICD-10-CM

## 2023-11-12 NOTE — Progress Notes (Signed)
 CHIEF COMPLAINT:   left hip pain      HISTORY OF PRESENT ILLNESS:   Helen [name] is a 78 y.o. year old female with left hip pain.  She has had pain for several months.  Pain is localized to the low back region and radiates down to the lateral aspect of her left  hip/trochanteric region.  No history of trauma.  Patient has previously been diagnosed with spinal stenosis and lumbar spondylosis.  Her symptoms get worse with cold weather and with weightbearing activities.  She has a DEXA scan which shows osteopenia.     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    Bilateral cataracts     Breast cancer (CMS/HCC)     Breast lump     Cancer (CMS/HCC)     breast cancer    Carcinoma (CMS/HCC)     Carcinoma of the bladder    Chicken pox     Childhood illness    Conduction Disorder     RBBB    Diabetes mellitus type II     Non-Insulin  dependent    Echocardiogram 10/2013    Gastroesophageal reflux disease     Hemorrhoids without complication     Holter monitor 02/2014    Hyperlipidemia     Hypertensive disorder     Malignant neoplasm of breast (CMS/HCC)     Measles     Childhood illness    Mumps     Childhood illness    Myocardial perfusion scan 11/2013, 12/2013    MPI single Isotope excrise; MPI dual Isotope Lexiscan     RBBB     Spinal stenosis     Stage 2 chronic kidney disease 10/29/2023    Syncope         PAST SURGICAL HISTORY:    has a past surgical history that includes Breast biopsy; Hysterectomy; Tumor removal (Left); ARTHROSCOPY, KNEE (Left, 04/21/2015); Ablation of dysrhythmic focus; EGD (N/A, 12/07/2022); and COLONOSCOPY, SCREENING (N/A, 12/07/2022).     MEDICATIONS:   Medications Ordered Prior to Encounter[1]     ALLERGIES:   Allergies[2].       SOCIAL HISTORY:   Social  History[3]      FAMILY HISTORY:   family history includes Diabetes in her mother, sister, and sister.       PHYSICAL EXAM:   Vitals:  Vitals:    11/12/23 1035   BP: 143/73   Pulse: 74   SpO2: 100%      BMI: Body mass index is 29.44 kg/m.     General:  Awake, alert, oriented to person, place, and time. Affect is normal.  Hearing is normal to the spoken word.  Breathing is unlabored.     left Hip  Patient walks with an antalgic gait. Examination of the left hip demonstrates skin is intact with no prior incisions. Patient has negative logroll test and Stinchfield test. The patient has flexion up to 90 degrees. At 90degrees, patient has 15 IR and 20 ER. The patient has no significant pain with gentle passive range of motion of the hip.  There  is tenderness palpation over bilateral trochanteric regions.     Extremities:  Vascular: The dorsalis pedis pulse is palpable bilaterally. There is good perfusion of both feet with good capillary refill.   Neurologic: The distal motor and sensory exam is grossly normal without appreciable deficit bilaterally. Intact extensor hallucis longus, flexor hallucis longus, tibialis anterior, and gastrocnemius soleus complex.     RADIOGRAPHS:   I independently reviewed the following images. The AP pelvis and radiographic views of the bilateral hip demonstrate no acute abnormality.  Patient has mild bilateral hip joint space narrowing.     IMPRESSION:  78 y.o. year old female with lumbar spondylosis and bilateral trochanteric bursitis     PLAN:      We had a lengthy discussion with the patient regarding the condition and treatment options.  Based on the patient's clinical presentation as well as radiographic imaging the patient has lumbar spondylosis and bilateral trochanteric bursitis. Given the severity of the patient's symptoms, we recommend a comprehensive nonoperative treatment program including oral medication, physical therapy, activity modification, injection.  Patient elected to  proceed with a home exercise program for trochanteric bursitis and a referral to [facility] for her lumbar spondylosis and low back pain.  If the patient's symptoms deteriorate we recommend the patient follow-up for discussion of further treatment. The patient was in agreement with the plan and all questions were answered.     I spent a total of 46 minutes on this visit obtaining and reviewing history, performing a physical exam, counseling the patient on managing the patient's condition(s), medications, ordering labs and documenting.      Leni Lash Daysie Helf MD  [facility]  Adult Hip and Knee Reconstruction        [1]   Current Outpatient Medications on File Prior to Visit   Medication Sig Dispense Refill    albuterol  (PROVENTIL ) (2.5 MG/3ML) 0.083% nebulizer solution TAKE 3 MLS (2.5 MG TOTAL) BY NEBULIZATION EVERY 4 (FOUR) HOURS AS NEEDED FOR SHORTNESS OF BREATH OR WHEEZING 75 mL 1    albuterol  sulfate HFA (PROVENTIL ) 108 (90 Base) MCG/ACT inhaler INHALE 2 PUFFS INTO THE LUNGS EVERY 4 HOURS AS NEEDED FOR WHEEZING OR SHORTNESS OF BREATH. 6.7 each 2    amLODIPine  (NORVASC ) 10 MG tablet TAKE 1 TABLET (10 MG) BY MOUTH DAILY. 90 tablet 3    atorvastatin  (LIPITOR) 40 MG tablet TAKE 1 TABLET BY MOUTH EVERY DAY 90 tablet 3    Blood Glucose Monitoring Suppl (ONE TOUCH ULTRA 2) W/DEVICE KIT One daily 1 each 0    Cholecalciferol (Vitamin D3) 2000 UNIT capsule Take 1 capsule (2,000 Units) by mouth daily 90 capsule 1    dexAMETHasone (DECADRON) 0.1 % ophthalmic solution Place 1 drop into both eyes      empagliflozin  (Jardiance ) 25 MG tablet Take 1 tablet (25 mg) by mouth every morning 90 tablet 1    EPINEPHrine  (EPIPEN  2-PAK) 0.3 MG/0.3ML Solution Auto-injector injection Inject 0.3 mLs (0.3 mg) into the muscle once as needed (Anaphylaxis) 1 each 3    EPINEPHrine  0.3 MG/0.3ML auto-injector 1 DOSE IF NEEDED FOR ANAPHYLAXIS      ferrous sulfate 325 (65 FE) MG tablet Take 1 tablet (325 mg) by mouth every morning with  breakfast      fluticasone  (FLONASE ) 50 MCG/ACT nasal spray 2 sprays by Nasal route daily      fluticasone  (FLOVENT  HFA) 220 MCG/ACT inhaler INHALE 1 PUFF INTO THE LUNGS TWICE A DAY 24 each 2  Glucagon , rDNA, (Glucagon  Emergency) 1 MG Kit Inject 1 mg into the skin once as needed (severe hypoglycemia) 1 kit 1    glucose blood (OneTouch Ultra) test strip 2 times daily 200 each 3    Lancets (onetouch ultrasoft) lancets Use to check blood sugars 2 times daily 200 each 3    lidocaine  (Lidoderm ) 5 % Place 1 patch onto the skin daily as needed (pain) Remove & Discard patch within 12 hours 30 patch 0    metFORMIN  (GLUCOPHAGE -XR) 500 MG 24 hr tablet Take 1 tablet (500 mg) by mouth 2 (two) times daily      montelukast (SINGULAIR) 10 MG tablet Take 1 tablet (10 mg) by mouth daily      NASONEX 50 MCG/ACT nasal spray 2 sprays by Nasal route daily as needed  5    pantoprazole  (PROTONIX ) 40 MG tablet TAKE 1 TABLET BY MOUTH EVERY DAY 90 tablet 1    Tradjenta  5 MG Tab TAKE 1 TABLET BY MOUTH EVERY DAY 90 tablet 1    valsartan -hydroCHLOROthiazide  (DIOVAN -HCT) 160-12.5 MG per tablet TAKE 1 TABLET BY MOUTH EVERY DAY 90 tablet 1    benzonatate  (TESSALON ) 100 MG capsule Take 1 capsule (100 mg) by mouth 3 (three) times daily as needed for Cough 30 capsule 0    glipiZIDE  (GLUCOTROL ) 10 MG tablet Take 2 tablets (20 mg) by mouth 2 (two) times daily before meals 360 tablet 1     No current facility-administered medications on file prior to visit.   [2]   Allergies  Allergen Reactions    Aspirin      Break out in sweat and dizziness     Other Environmental     Peppers     Pollen Extract     Latex Itching and Rash   [3]   Social History  Tobacco Use    Smoking status: Never     Passive exposure: Never    Smokeless tobacco: Never   Vaping Use    Vaping status: Never Used   Substance Use Topics    Alcohol use: No    Drug use: Never

## 2023-11-13 ENCOUNTER — Encounter: Payer: Self-pay | Admitting: Family Medicine

## 2023-11-13 ENCOUNTER — Ambulatory Visit
Admission: RE | Admit: 2023-11-13 | Discharge: 2023-11-13 | Disposition: A | Discharge status: Home / Self Care | Source: Ambulatory Visit | Attending: Family | Admitting: Family

## 2023-11-13 ENCOUNTER — Telehealth

## 2023-11-13 DIAGNOSIS — Z1231 Encounter for screening mammogram for malignant neoplasm of breast: Secondary | ICD-10-CM | POA: Insufficient documentation

## 2023-11-18 ENCOUNTER — Other Ambulatory Visit: Payer: Self-pay | Admitting: Family Medicine

## 2023-11-19 NOTE — Telephone Encounter (Signed)
 Requested Prescriptions  Pending Prescriptions Disp Refills   levothyroxine (SYNTHROID) 50 MCG tablet [Pharmacy Med Name: Levothyroxine Sodium Oral Tablet 50 MCG] 90 tablet 1    Sig: TAKE 1 TABLET EVERY DAY     Endocrinology:  Hypothyroid Agents Failed - 11/19/2023  1:31 PM      Failed - Valid encounter within last 12 months    Recent Outpatient Visits           2 years ago Vertigo   [facility] Pickard, Priscille Heidelberg, MD   2 years ago Dysuria   [facility] Donita Brooks, MD   2 years ago Benign essential HTN   [facility] Donita Brooks, MD   2 years ago Closed fracture of right hip with routine healing, subsequent encounter   [facility] Donita Brooks, MD   3 years ago Complex cyst of left ovary   [facility] Pickard, Priscille Heidelberg, MD              Passed - TSH in normal range and within 360 days    TSH  Date Value Ref Range Status  07/16/2023 1.31 0.40 - 4.50 mIU/L Final          pantoprazole (PROTONIX) 40 MG tablet [Pharmacy Med Name: Pantoprazole Sodium Oral Tablet Delayed Release 40 MG] 180 tablet 1    Sig: TAKE 1 TABLET TWICE DAILY     Gastroenterology: Proton Pump Inhibitors Failed - 11/19/2023  1:31 PM      Failed - Valid encounter within last 12 months    Recent Outpatient Visits           2 years ago Vertigo   [facility] Donita Brooks, MD   2 years ago Dysuria   [facility] Donita Brooks, MD   2 years ago Benign essential HTN   [facility] Donita Brooks, MD   2 years ago Closed fracture of right hip with routine healing, subsequent encounter   [facility] Donita Brooks, MD   3 years ago Complex cyst of left ovary   [facility] Pickard, Priscille Heidelberg, MD

## 2023-11-21 ENCOUNTER — Encounter (INDEPENDENT_AMBULATORY_CARE_PROVIDER_SITE_OTHER): Payer: Self-pay | Admitting: Family

## 2023-11-29 ENCOUNTER — Other Ambulatory Visit (INDEPENDENT_AMBULATORY_CARE_PROVIDER_SITE_OTHER): Payer: Self-pay | Admitting: Family

## 2023-11-29 ENCOUNTER — Telehealth: Payer: Self-pay | Admitting: Nurse Practitioner

## 2023-11-29 DIAGNOSIS — I10 Essential (primary) hypertension: Secondary | ICD-10-CM

## 2023-11-29 NOTE — Telephone Encounter (Signed)
 Last filled October 2024.   Last o/v February 2025.  Patient has an upcoming appointment on February 10 2024.  Queued up 90 with 0 refills.

## 2023-11-29 NOTE — Telephone Encounter (Signed)
 Copied from CRM [id number]. Topic: Clinical Support - Speak With Nurse  >> Nov 29, 2023 12:54 PM Alisa HERO wrote:  Helen [name] called about Clinical Support - Speak With Nurse.  Additional details:  >> Nov 29, 2023 12:55 PM Alisa HERO wrote:  Patient needs to speak with a nurse about receiving a monitor, where she does not have to prick her finger to check sugar levels. Would like to have someone call her to explain Ito receive this device.

## 2023-12-04 NOTE — Telephone Encounter (Signed)
 Left a voicemail on patient mobile phone. Stating her insurance will not cover CGM due to her not being insulin medication. Left a call back number

## 2023-12-12 ENCOUNTER — Ambulatory Visit (INDEPENDENT_AMBULATORY_CARE_PROVIDER_SITE_OTHER): Payer: Self-pay

## 2023-12-12 DIAGNOSIS — I10 Essential (primary) hypertension: Secondary | ICD-10-CM

## 2023-12-12 DIAGNOSIS — E119 Type 2 diabetes mellitus without complications: Secondary | ICD-10-CM

## 2023-12-14 ENCOUNTER — Other Ambulatory Visit (INDEPENDENT_AMBULATORY_CARE_PROVIDER_SITE_OTHER): Payer: Self-pay | Admitting: Family

## 2023-12-14 DIAGNOSIS — E119 Type 2 diabetes mellitus without complications: Secondary | ICD-10-CM

## 2023-12-17 NOTE — Telephone Encounter (Signed)
 Last filled January 2025.   Last o/v February 2025.  Patient does not have an upcoming appointment  Queued up  180  with 1 refills.

## 2023-12-18 ENCOUNTER — Encounter (INDEPENDENT_AMBULATORY_CARE_PROVIDER_SITE_OTHER): Payer: Self-pay | Admitting: Nurse Practitioner

## 2023-12-18 DIAGNOSIS — E119 Type 2 diabetes mellitus without complications: Secondary | ICD-10-CM

## 2023-12-26 ENCOUNTER — Telehealth (INDEPENDENT_AMBULATORY_CARE_PROVIDER_SITE_OTHER): Payer: Self-pay | Admitting: Family

## 2023-12-26 DIAGNOSIS — L709 Acne, unspecified: Secondary | ICD-10-CM

## 2023-12-26 NOTE — Telephone Encounter (Signed)
 Referral printed and faxed to requested Dermatologist.

## 2023-12-26 NOTE — Telephone Encounter (Signed)
 Referral order in, please fax

## 2023-12-26 NOTE — Telephone Encounter (Signed)
 Copied from CRM [id number]. Topic: Appointment Scheduling - Schedule Appointment  >> Dec 26, 2023  2:40 PM [name] wrote:  [name] called about Appointment Scheduling - Schedule Appointment.  Additional details:    Pt called and requested a referral for Dermatologist     DR. Dru Georges  NPI# [id number]  [address]  [place], [place] [address]  Phone: [phone]  Fax: [phone]

## 2023-12-27 ENCOUNTER — Encounter (INDEPENDENT_AMBULATORY_CARE_PROVIDER_SITE_OTHER): Payer: Self-pay | Admitting: Family

## 2024-01-01 ENCOUNTER — Telehealth (INDEPENDENT_AMBULATORY_CARE_PROVIDER_SITE_OTHER): Payer: Self-pay | Admitting: Family

## 2024-01-01 DIAGNOSIS — L709 Acne, unspecified: Secondary | ICD-10-CM

## 2024-01-01 NOTE — Telephone Encounter (Signed)
 Pt called stating the dermatologist she was referred to doesn't take her insurance. Pt found a dermatologist through her insurance but need referral and her appt is on 05/15. Please fax referral    [facility] - [address]  [address]  [place], [place] [address]  Ph: [phone]  Fax: [phone]

## 2024-01-02 ENCOUNTER — Telehealth: Payer: Self-pay | Admitting: Family Medicine

## 2024-01-02 ENCOUNTER — Ambulatory Visit (INDEPENDENT_AMBULATORY_CARE_PROVIDER_SITE_OTHER): Payer: PRIVATE HEALTH INSURANCE | Admitting: Family Medicine

## 2024-01-02 ENCOUNTER — Other Ambulatory Visit: Payer: Self-pay

## 2024-01-02 ENCOUNTER — Encounter: Payer: Self-pay | Admitting: Family Medicine

## 2024-01-02 ENCOUNTER — Other Ambulatory Visit: Payer: Self-pay | Admitting: Family Medicine

## 2024-01-02 ENCOUNTER — Ambulatory Visit: Payer: Self-pay

## 2024-01-02 ENCOUNTER — Encounter (INDEPENDENT_AMBULATORY_CARE_PROVIDER_SITE_OTHER): Payer: Self-pay | Admitting: Family

## 2024-01-02 VITALS — BP 124/72 | HR 76 | Temp 98.3°F | Ht 66.0 in | Wt 166.0 lb

## 2024-01-02 DIAGNOSIS — E038 Other specified hypothyroidism: Secondary | ICD-10-CM | POA: Diagnosis not present

## 2024-01-02 DIAGNOSIS — N1831 Chronic kidney disease, stage 3a: Secondary | ICD-10-CM | POA: Diagnosis not present

## 2024-01-02 MED ORDER — DIPHENOXYLATE-ATROPINE 2.5-0.025 MG PO TABS: 2.0000 | ORAL_TABLET | Freq: Four times a day (QID) | ORAL | 0 refills | Status: DC | PRN

## 2024-01-02 NOTE — Telephone Encounter (Signed)
 Pt called to request a medication, Lomotil , that is not on her active medication list. This RN notes patient had appt today with Dr. Cheril Cork and unsure if this was discussed. This RN attempted to contact patient with no answer. LVM. Will route to office for further follow up.

## 2024-01-02 NOTE — Progress Notes (Signed)
 Subjective:    Patient ID: April Beck, female    DOB: March 06, 1946, 78 y.o.   MRN: [id number]  Patient is a 77 year old Caucasian female with a history of hypertension, hypothyroidism, and chronic kidney disease.  She continues to endorse pain in her neck.  Physical therapy was not beneficial.  She reports pain with range of motion and stiffness.  She is unable to take NSAIDs due to chronic disease.  Tylenol  is minimally beneficial.  We discussed a referral to orthopedics but at the present time she declines this.  Her immunizations are up-to-date including her RSV vaccine.  She denies any chest pain shortness of breath or dyspnea on exertion. Immunization History  Administered Date(s) Administered   Fluad Quad(high Dose 65+) 05/21/2019, 06/19/2021, 06/15/2022   Influenza Split 06/20/2012, 06/29/2020   Influenza, High Dose Seasonal PF 06/20/2017, 07/11/2018, 06/29/2020   Influenza,inj,Quad PF,6+ Mos 06/01/2013, 05/13/2014, 06/27/2015   Influenza-Unspecified 06/18/2023   PFIZER(Purple Top)SARS-COV-2 Vaccination 11/30/2019, 12/23/2019, 06/07/2020   Pfizer Covid-19 Vaccine Bivalent Booster 60yrs & up 07/04/2021   Pfizer(Comirnaty)Fall Seasonal Vaccine 12 years and older 06/20/2022, 06/18/2023   Pneumococcal Conjugate-13 10/23/2013   Pneumococcal Polysaccharide-23 06/20/2012   Respiratory Syncytial Virus Vaccine,Recomb Aduvanted(Arexvy) 06/27/2022   Tdap 06/20/2017   Zoster Recombinant(Shingrix ) 08/29/2017, 12/13/2017   Zoster, Live 08/04/2009   Past Medical History:  Diagnosis Date   Anxiety    hx of panic attack   Arthritis    hands & knees   CKD (chronic kidney disease), stage III (HCC)    followed by pcp   Closed fracture of left distal radius 10/31/2021   Edema of both lower extremities    GERD (gastroesophageal reflux disease)    History of 2019 novel coronavirus disease (COVID-19) 09/17/2020   positive result in epic,  per pt very mild symtpoms that resolved   History of  uterine fibroid    Hypertension    followed by pcp   Hypomagnesemia    Hypothyroidism    followed by pcp   IDA (iron deficiency anemia)    Osteopenia    Ovarian cyst    Pelvic pain    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, takes meloxicam    Wears contact lenses    Past Surgical History:  Procedure Laterality Date   ABDOMINAL HYSTERECTOMY  1982   CATARACT EXTRACTION Bilateral    CHOLECYSTECTOMY  06/20/2012   Procedure: LAPAROSCOPIC CHOLECYSTECTOMY;  Surgeon: Cloyce Darby, MD;  Location: [facility];  Service: General;  Laterality: N/A;   COLONOSCOPY  last one 03-20-2019  dr Howard Macho   DIAGNOSTIC LAPAROSCOPY  yrs ago   HIP ARTHROPLASTY Right 11/13/2020   Procedure: ARTHROPLASTY BIPOLAR HIP (HEMIARTHROPLASTY);  Surgeon: Darrin Emerald, MD;  Location: [facility];  Service: Orthopedics;  Laterality: Right;   INCONTINENCE SURGERY  09-11-2005   dr Inga Manges  @[facility]    LYNX SLING   LAPAROSCOPIC SALPINGO OOPHERECTOMY Bilateral 01/16/2021   Procedure: DIAGNOSTIC LAPAROSCOPY;  Surgeon: Woodrow Hazy, MD;  Location: [facility];  Service: Gynecology;  Laterality: Bilateral;   LAPAROSCOPY Bilateral 01/16/2021   Procedure: EXPLORATORY LAPAROTOMY BILATERAL SALPINGO OOPHORECTOMY;  Surgeon: Woodrow Hazy, MD;  Location[facility];  Service: Gynecology;  Laterality: Bilateral;   TUBAL LIGATION     Current Outpatient Medications on File Prior to Visit  Medication Sig Dispense Refill   acetaminophen  (TYLENOL ) 325 MG tablet Take 2 tablets (650 mg total) by mouth every 6 (six) hours as needed for pain. (Patient taking differently:  Take 650 mg by mouth every 6 (six) hours as needed for pain. Arthritis formula per pt)     acyclovir  (ZOVIRAX ) 400 MG tablet TAKE 1 TABLET EVERY DAY 90 tablet 3   atorvastatin  (LIPITOR) 40 MG tablet TAKE 1 TABLET EVERY DAY 90 tablet 3   calcium -vitamin D  (OSCAL WITH D) 250-125 MG-UNIT tablet Take 1 tablet by mouth daily.      citalopram  (CELEXA ) 10 MG tablet TAKE 1 TABLET EVERY DAY 90 tablet 3   cyclobenzaprine  (FLEXERIL ) 5 MG tablet Take 1 tablet (5 mg total) by mouth 3 (three) times daily as needed for muscle spasms. 30 tablet 1   estradiol  (ESTRACE  VAGINAL) 0.1 MG/GM vaginal cream Place 1 Applicatorful vaginally at bedtime. 42 each 12   fluconazole  (DIFLUCAN ) 150 MG tablet Take 1 tablet (150 mg total) by mouth daily. 2 tablet 0   fluocinonide gel (LIDEX) 0.05 % Apply 1 application topically 3 (three) times daily as needed (rash).     levothyroxine  (SYNTHROID ) 50 MCG tablet TAKE 1 TABLET EVERY DAY 90 tablet 1   losartan  (COZAAR ) 50 MG tablet TAKE 1 TABLET EVERY DAY 90 tablet 3   meclizine  (ANTIVERT ) 25 MG tablet Take 1 tablet (25 mg total) by mouth 3 (three) times daily as needed for dizziness. (Patient taking differently: Take 25 mg by mouth 3 (three) times daily as needed for dizziness. Uses As Needed.) 30 tablet 0   meclizine  (ANTIVERT ) 25 MG tablet Take 1 tablet (25 mg total) by mouth 3 (three) times daily as needed. 30 tablet 0   pantoprazole  (PROTONIX ) 40 MG tablet TAKE 1 TABLET TWICE DAILY 180 tablet 1   No current facility-administered medications on file prior to visit.   No Known Allergies Social History   Socioeconomic History   Marital status: Divorced    Spouse name: Not on file   Number of children: Not on file   Years of education: Not on file   Highest education level: 12th grade  Occupational History   Not on file  Tobacco Use   Smoking status: Never   Smokeless tobacco: Never  Vaping Use   Vaping status: Never Used  Substance and Sexual Activity   Alcohol use: No   Drug use: Never   Sexual activity: Not Currently    Birth control/protection: Surgical  Other Topics Concern   Not on file  Social History Narrative   Not on file   Social Drivers of Health   Financial Resource Strain: Low Risk  (09/16/2023)   Overall Financial Resource Strain (CARDIA)    Difficulty of Paying  Living Expenses: Not hard at all  Food Insecurity: No Food Insecurity (09/16/2023)   Hunger Vital Sign    Worried About Running Out of Food in the Last Year: Never true    Ran Out of Food in the Last Year: Never true  Transportation Needs: No Transportation Needs (09/16/2023)   PRAPARE - Administrator, Civil Service (Medical): No    Lack of Transportation (Non-Medical): No  Physical Activity: Unknown (09/16/2023)   Exercise Vital Sign    Days of Exercise per Week: 0 days    Minutes of Exercise per Session: Not on file  Stress: No Stress Concern Present (09/16/2023)   [employer] of Occupational Health - Occupational Stress Questionnaire    Feeling of Stress : Not at all  Social Connections: Moderately Integrated (09/16/2023)   Social Connection and Isolation Panel [NHANES]    Frequency of Communication with Friends and Family:  Three times a week    Frequency of Social Gatherings with Friends and Family: Three times a week    Attends Religious Services: More than 4 times per year    Active Member of Clubs or Organizations: Yes    Attends Banker Meetings: More than 4 times per year    Marital Status: Divorced  Intimate Partner Violence: Not At Risk (02/23/2021)   Humiliation, Afraid, Rape, and Kick questionnaire    Fear of Current or Ex-Partner: No    Emotionally Abused: No    Physically Abused: No    Sexually Abused: No       Review of Systems  All other systems reviewed and are negative.      Objective:   Physical Exam Vitals reviewed.  Constitutional:      General: She is not in acute distress.    Appearance: She is well-developed. She is not diaphoretic.  HENT:     Head: Normocephalic and atraumatic.     Right Ear: External ear normal.     Left Ear: External ear normal.     Nose: Nose normal.     Mouth/Throat:     Pharynx: No oropharyngeal exudate.  Eyes:     General: No scleral icterus.       Right eye: No discharge.        Left  eye: No discharge.     Conjunctiva/sclera: Conjunctivae normal.     Pupils: Pupils are equal, round, and reactive to light.  Neck:     Thyroid : No thyromegaly.     Vascular: No JVD.     Trachea: No tracheal deviation.  Cardiovascular:     Rate and Rhythm: Normal rate and regular rhythm.     Heart sounds: Normal heart sounds. No murmur heard.    No friction rub. No gallop.  Pulmonary:     Effort: Pulmonary effort is normal. No respiratory distress.     Breath sounds: Normal breath sounds. No stridor. No wheezing or rales.  Chest:     Chest wall: No tenderness.  Abdominal:     General: Bowel sounds are normal. There is no distension.     Palpations: Abdomen is soft. There is no mass.     Tenderness: There is no abdominal tenderness. There is no guarding or rebound.  Musculoskeletal:        General: No tenderness or deformity.     Cervical back: Neck supple. No edema or rigidity. Pain with movement and muscular tenderness present. No spinous process tenderness. Decreased range of motion.  Lymphadenopathy:     Cervical: No cervical adenopathy.  Skin:    General: Skin is warm.     Coloration: Skin is not pale.     Findings: No erythema or rash.  Neurological:     Mental Status: She is alert and oriented to person, place, and time.     Cranial Nerves: No cranial nerve deficit.     Motor: No abnormal muscle tone.     Coordination: Coordination normal.     Deep Tendon Reflexes: Reflexes are normal and symmetric. Reflexes normal.  Psychiatric:        Behavior: Behavior normal.        Thought Content: Thought content normal.        Judgment: Judgment normal.           Assessment & Plan:  Stage 3a chronic kidney disease (HCC) - Plan: CBC with Differential/Platelet, COMPLETE METABOLIC PANEL WITHOUT GFR, TSH, Lipid  panel  Other specified hypothyroidism - Plan: CBC with Differential/Platelet, COMPLETE METABOLIC PANEL WITHOUT GFR, TSH, Lipid panel I am very happy with her blood  pressure today.  Check CBC, CMP, lipid panel, and TSH.  Ensure adequate dosage of levothyroxine  to achieve TSH in therapeutic range.  Monitor kidney function.  Monitor cholesterol.  Goal LDL is less than 100.  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  Patient declines this.

## 2024-01-02 NOTE — Telephone Encounter (Signed)
 Pt requested a refill of Lomotil  which is not on her med list. RN called pt to triage her for her symptoms. Pt did not answer, RN LVM with a CB number.

## 2024-01-02 NOTE — Telephone Encounter (Signed)
 See triage encounter.

## 2024-01-02 NOTE — Telephone Encounter (Signed)
 Copied from CRM [id number]. Topic: Clinical - Medication Refill >> Jan 02, 2024 11:44 AM Rosaria Common wrote: Most Recent Primary Care Visit:  Provider: Eliane Grooms T  Department: [facility]  Visit Type: OFFICE VISIT  Date: 01/02/2024  Medication: (not on list) Lomotil   Has the patient contacted their pharmacy? Yes (Agent: If no, request that the patient contact the pharmacy for the refill. If patient does not wish to contact the pharmacy document the reason why and proceed with request.) (Agent: If yes, when and what did the pharmacy advise?)  Is this the correct pharmacy for this prescription? Yes If no, delete pharmacy and type the correct one.  This is the patient's preferred pharmacy:  [id number] [place], [place] - [address] [place] [address] Phone: [phone] Fax: [phone]     Has the prescription been filled recently? No  Is the patient out of the medication? Yes  Has the patient been seen for an appointment in the last year OR does the patient have an upcoming appointment? Yes  Can we respond through MyChart? Yes  Agent: Please be advised that Rx refills may take up to 3 business days. We ask that you follow-up with your pharmacy.

## 2024-01-02 NOTE — Telephone Encounter (Signed)
 Printed and faxed

## 2024-01-02 NOTE — Telephone Encounter (Signed)
 Referral order in, please fax

## 2024-01-03 LAB — COMPLETE METABOLIC PANEL WITHOUT GFR
AG Ratio: 2 (calc) (ref 1.0–2.5)
ALT: 13 U/L (ref 6–29)
AST: 20 U/L (ref 10–35)
Albumin: 4.1 g/dL (ref 3.6–5.1)
Alkaline phosphatase (APISO): 46 U/L (ref 37–153)
BUN: 16 mg/dL (ref 7–25)
CO2: 30 mmol/L (ref 20–32)
Calcium: 9.5 mg/dL (ref 8.6–10.4)
Chloride: 102 mmol/L (ref 98–110)
Creat: 0.95 mg/dL (ref 0.60–1.00)
Globulin: 2.1 g/dL (ref 1.9–3.7)
Glucose, Bld: 91 mg/dL (ref 65–99)
Potassium: 4.2 mmol/L (ref 3.5–5.3)
Sodium: 140 mmol/L (ref 135–146)
Total Bilirubin: 0.7 mg/dL (ref 0.2–1.2)
Total Protein: 6.2 g/dL (ref 6.1–8.1)

## 2024-01-03 LAB — CBC WITH DIFFERENTIAL/PLATELET
Absolute Lymphocytes: 1836 {cells}/uL (ref 850–3900)
Absolute Monocytes: 468 {cells}/uL (ref 200–950)
Basophils Absolute: 32 {cells}/uL (ref 0–200)
Basophils Relative: 0.7 %
Eosinophils Absolute: 149 {cells}/uL (ref 15–500)
Eosinophils Relative: 3.3 %
HCT: 38.9 % (ref 35.0–45.0)
Hemoglobin: 12.6 g/dL (ref 11.7–15.5)
MCH: 31.3 pg (ref 27.0–33.0)
MCHC: 32.4 g/dL (ref 32.0–36.0)
MCV: 96.5 fL (ref 80.0–100.0)
MPV: 9.8 fL (ref 7.5–12.5)
Monocytes Relative: 10.4 %
Neutro Abs: 2016 {cells}/uL (ref 1500–7800)
Neutrophils Relative %: 44.8 %
Platelets: 180 10*3/uL (ref 140–400)
RBC: 4.03 10*6/uL (ref 3.80–5.10)
RDW: 11.1 % (ref 11.0–15.0)
Total Lymphocyte: 40.8 %
WBC: 4.5 10*3/uL (ref 3.8–10.8)

## 2024-01-03 LAB — LIPID PANEL
Cholesterol: 130 mg/dL (ref <200)
HDL: 53 mg/dL (ref >=50)
LDL Cholesterol (Calc): 56 mg/dL
Non-HDL Cholesterol (Calc): 77 mg/dL (ref <130)
Total CHOL/HDL Ratio: 2.5 (calc) (ref <5.0)
Triglycerides: 125 mg/dL (ref <150)

## 2024-01-03 LAB — TSH: TSH: 1.14 m[IU]/L (ref 0.40–4.50)

## 2024-01-14 ENCOUNTER — Ambulatory Visit (INDEPENDENT_AMBULATORY_CARE_PROVIDER_SITE_OTHER)

## 2024-01-14 DIAGNOSIS — E119 Type 2 diabetes mellitus without complications: Secondary | ICD-10-CM

## 2024-01-14 DIAGNOSIS — I10 Essential (primary) hypertension: Secondary | ICD-10-CM

## 2024-01-23 ENCOUNTER — Encounter (INDEPENDENT_AMBULATORY_CARE_PROVIDER_SITE_OTHER): Payer: Self-pay | Admitting: Family

## 2024-01-24 ENCOUNTER — Other Ambulatory Visit (INDEPENDENT_AMBULATORY_CARE_PROVIDER_SITE_OTHER): Payer: Self-pay | Admitting: Family

## 2024-01-24 DIAGNOSIS — E119 Type 2 diabetes mellitus without complications: Secondary | ICD-10-CM

## 2024-01-24 NOTE — Telephone Encounter (Signed)
 Last refill   Last office Visit  Upcoming appointment   Medication pending

## 2024-01-24 NOTE — Telephone Encounter (Signed)
 Last refill 12/17/2023  Last office Visit 10/28/2023  Upcoming appointment 02/10/2024  Medication pending

## 2024-02-04 ENCOUNTER — Other Ambulatory Visit (INDEPENDENT_AMBULATORY_CARE_PROVIDER_SITE_OTHER): Payer: Self-pay | Admitting: Family

## 2024-02-04 ENCOUNTER — Other Ambulatory Visit (INDEPENDENT_AMBULATORY_CARE_PROVIDER_SITE_OTHER): Payer: Self-pay | Admitting: Nurse Practitioner

## 2024-02-04 DIAGNOSIS — E7800 Pure hypercholesterolemia, unspecified: Secondary | ICD-10-CM

## 2024-02-04 DIAGNOSIS — E1165 Type 2 diabetes mellitus with hyperglycemia: Secondary | ICD-10-CM

## 2024-02-04 NOTE — Telephone Encounter (Signed)
 Last Ov: 09/17/2023  Next appointment: 03/10/2024   Last labs: 09/17/2023     Please review if the pended prescription is appropriate. Thank you

## 2024-02-04 NOTE — Telephone Encounter (Signed)
 Refill sent for Jardiance    Dr. Calyn Sivils  [facility] - [place] Office

## 2024-02-08 ENCOUNTER — Encounter: Payer: Self-pay | Admitting: Family Medicine

## 2024-02-10 ENCOUNTER — Telehealth: Payer: Self-pay | Admitting: Physical Medicine and Rehabilitation

## 2024-02-10 ENCOUNTER — Ambulatory Visit (INDEPENDENT_AMBULATORY_CARE_PROVIDER_SITE_OTHER): Payer: Medicare Other | Admitting: Family

## 2024-02-10 ENCOUNTER — Encounter (INDEPENDENT_AMBULATORY_CARE_PROVIDER_SITE_OTHER): Payer: Self-pay | Admitting: Family

## 2024-02-10 ENCOUNTER — Encounter (INDEPENDENT_AMBULATORY_CARE_PROVIDER_SITE_OTHER): Payer: Self-pay

## 2024-02-10 ENCOUNTER — Other Ambulatory Visit: Payer: Self-pay | Admitting: Nurse Practitioner

## 2024-02-10 VITALS — BP 118/69 | HR 69 | Wt 155.0 lb

## 2024-02-10 DIAGNOSIS — J302 Other seasonal allergic rhinitis: Secondary | ICD-10-CM

## 2024-02-10 DIAGNOSIS — N182 Chronic kidney disease, stage 2 (mild): Secondary | ICD-10-CM

## 2024-02-10 DIAGNOSIS — E119 Type 2 diabetes mellitus without complications: Secondary | ICD-10-CM

## 2024-02-10 MED ORDER — TRADJENTA 5 MG PO TABS
1.0000 | ORAL_TABLET | Freq: Every day | ORAL | 0 refills | Status: DC
Start: 2024-02-10 — End: 2024-03-23

## 2024-02-10 MED ORDER — AZELASTINE HCL 0.1 % NA SOLN
2.0000 | Freq: Two times a day (BID) | NASAL | 1 refills | Status: DC
Start: 2024-02-10 — End: 2024-05-20

## 2024-02-10 NOTE — Telephone Encounter (Signed)
 Patient called. Would like an appointment with Dr. Alvester Morin

## 2024-02-10 NOTE — Progress Notes (Signed)
 [facility]  [place]  PROGRESS NOTE      Patient: Helen [name]   Date: 02/10/2024   MRN: [id number]     Helen [name] is a 78 y.o. female    Chief Complaint   Patient presents with    Kidney Disease     Follow up       MEDICATIONS     Current Medications[1]    Allergies[2]    SUBJECTIVE     Chief Complaint   Patient presents with    Kidney Disease     Follow up        HPI  Pt for follow up, CKD stage 2  Hx DMII- she is est with endo for management but states her sugars have been really good, fasting 100-120, nonfasting about 110-140.   HTN well controlled- compliant with meds    She does continue to have significant pain- she consulted with ortho in March but has since not been able to make follow up appointments due to transportation- continues with tylenol  PRN and lidocaine  patches, she is avoiding NSAIDs because of her renal function and does not want to take any meds that could be addictive.     Denies edema, CP, SOB, urinary symptoms    She continues working with an allergist- getting allergy shots, but states nose constantly running. Using flonase , montelucast and zyrtec.      ROS     Review of Systems   Constitutional:  Negative for fever and unexpected weight change.   Cardiovascular:  Negative for chest pain and leg swelling.   Gastrointestinal:  Negative for abdominal pain, diarrhea, nausea and vomiting.   Genitourinary:  Negative for decreased urine volume, difficulty urinating, dysuria, frequency and hematuria.   Musculoskeletal:  Positive for arthralgias and gait problem.             The following portions of the patient's history were reviewed and updated as appropriate: Allergies, Current Medications, Past Family History, Past Medical history, Past social history, Past surgical history, and Problem List.    PHYSICAL EXAM     Vitals:    02/10/24 1115   BP: 118/69   BP Site: Left arm   Patient Position: Sitting   Cuff Size: Medium   Pulse: 69   SpO2: 97%   Weight: 70.3 kg (155 lb)        Physical Exam  Vitals and nursing note reviewed.   Constitutional:       General: She is awake. She is not in acute distress.     Appearance: Normal appearance.   Cardiovascular:      Rate and Rhythm: Normal rate and regular rhythm.      Heart sounds: Normal heart sounds.   Pulmonary:      Effort: Pulmonary effort is normal.      Breath sounds: Normal breath sounds and air entry.   Musculoskeletal:      Right lower leg: No edema.      Left lower leg: No edema.   Neurological:      Mental Status: She is alert and oriented to person, place, and time.   Psychiatric:         Mood and Affect: Mood normal.         Speech: Speech normal.         Behavior: Behavior normal.             ASSESSMENT/PLAN        1. Stage 2  chronic kidney disease  Basic Metabolic Panel    Urine Microalbumin, Random    Basic Metabolic Panel    Urine Microalbumin, Random    Chronic, stable. Continue with diabetic and HTN management. Continue well balanced diet, adequate hydration and routine exercise. Try to avoid NSAID use.      2. Seasonal allergic rhinitis, unspecified trigger  azelastine (ASTELIN) 0.1 % nasal spray  Trial new nasal spray, continue oral meds.         Reviewed med use and side effects. Reviewed s/s that would warrant further and/ or immediate medical attention. Pt in agreement with plan and all questions answered.       Risk & Benefits of the new medication(s) were explained to the patient (and family) who verbalized understanding & agreed to the treatment plan. Patient (family) encouraged to contact me/clinical staff with any questions/concerns      Return in about 3 months (around 05/12/2024) for annual exam AND as needed.    Signed,  Laymon LULLA Alert, FNP, FNP  02/10/2024         [1]   Current Outpatient Medications   Medication Sig Dispense Refill    albuterol  (PROVENTIL ) (2.5 MG/3ML) 0.083% nebulizer solution TAKE 3 MLS (2.5 MG TOTAL) BY NEBULIZATION EVERY 4 (FOUR) HOURS AS NEEDED FOR SHORTNESS OF BREATH OR WHEEZING 75 mL  1    albuterol  sulfate HFA (PROVENTIL ) 108 (90 Base) MCG/ACT inhaler INHALE 2 PUFFS INTO THE LUNGS EVERY 4 HOURS AS NEEDED FOR WHEEZING OR SHORTNESS OF BREATH. 6.7 each 2    amLODIPine  (NORVASC ) 10 MG tablet TAKE 1 TABLET (10 MG) BY MOUTH DAILY. 90 tablet 3    atorvastatin  (LIPITOR) 40 MG tablet TAKE 1 TABLET BY MOUTH EVERY DAY 90 tablet 0    Blood Glucose Monitoring Suppl (ONE TOUCH ULTRA 2) W/DEVICE KIT One daily 1 each 0    cetirizine (ZyrTEC) 10 MG tablet Take 1 tablet (10 mg) by mouth once daily      Cholecalciferol (Vitamin D3) 2000 UNIT capsule Take 1 capsule (2,000 Units) by mouth daily 90 capsule 1    dexAMETHasone (DECADRON) 0.1 % ophthalmic solution Place 1 drop into both eyes      EPINEPHrine  (EPIPEN  2-PAK) 0.3 MG/0.3ML Solution Auto-injector injection Inject 0.3 mLs (0.3 mg) into the muscle once as needed (Anaphylaxis) 1 each 3    EPINEPHrine  0.3 MG/0.3ML auto-injector 1 DOSE IF NEEDED FOR ANAPHYLAXIS      ferrous sulfate 325 (65 FE) MG tablet Take 1 tablet (325 mg) by mouth every morning with breakfast      fluticasone  (FLONASE ) 50 MCG/ACT nasal spray 2 sprays by Nasal route daily      fluticasone  (FLOVENT  HFA) 220 MCG/ACT inhaler INHALE 1 PUFF INTO THE LUNGS TWICE A DAY 24 each 2    glipiZIDE  (GLUCOTROL ) 10 MG tablet Take 2 tablets (20 mg) by mouth 2 (two) times daily before meals 360 tablet 1    Glucagon , rDNA, (Glucagon  Emergency) 1 MG Kit Inject 1 mg into the skin once as needed (severe hypoglycemia) 1 kit 1    Jardiance  25 MG tablet TAKE 1 TABLET BY MOUTH EVERY MORNING 90 tablet 1    Lancets (onetouch ultrasoft) lancets Use to check blood sugars 2 times daily 200 each 3    lidocaine  (Lidoderm ) 5 % Place 1 patch onto the skin daily as needed (pain) Remove & Discard patch within 12 hours 30 patch 0    metFORMIN  (GLUCOPHAGE -XR) 500 MG 24 hr tablet TAKE 2  TABLETS (1,000 MG) BY MOUTH TWICE A DAY 360 tablet 1    montelukast (SINGULAIR) 10 MG tablet Take 1 tablet (10 mg) by mouth daily      NASONEX 50  MCG/ACT nasal spray 2 sprays by Nasal route daily as needed  5    OneTouch Ultra test strip TWICE A DAY 200 strip 3    pantoprazole  (PROTONIX ) 40 MG tablet TAKE 1 TABLET BY MOUTH EVERY DAY 90 tablet 1    Tradjenta  5 MG Tab TAKE 1 TABLET BY MOUTH EVERY DAY 90 tablet 1    valsartan -hydroCHLOROthiazide  (DIOVAN -HCT) 160-12.5 MG per tablet TAKE 1 TABLET BY MOUTH EVERY DAY 90 tablet 1    azelastine (ASTELIN) 0.1 % nasal spray 2 sprays by Nasal route 2 (two) times daily Use in each nostril as directed 30 mL 1     No current facility-administered medications for this visit.   [2]   Allergies  Allergen Reactions    Aspirin      Break out in sweat and dizziness     Other Environmental     Peppers     Pollen Extract     Latex Itching and Rash

## 2024-02-10 NOTE — Telephone Encounter (Signed)
 Last OV: 09/17/2023     Next visit: 03/10/2024     Please see if the pended prescription is appropriate. Thank you

## 2024-02-10 NOTE — Telephone Encounter (Signed)
 Copied from CRM [id number]. Topic: Clinical Support - Prescription Refill  >> Feb 10, 2024  1:10 PM Asberry MATSU wrote:  Helen [name] called about Clinical Support - Prescription Refill.  Additional details:    Name, strength, directions of requested refill(s):  Tradjenta  5 MG Tab    How much medication is remaining: 0    Pharmacy to send refill to or patient to pick up rx from office (mark requested pharmacy in BOLD):    [id number] - [place], [place] - [address]    Please mark X next to the preferred call back number:    Mobile: [phone] (mobile) X  Home: [phone] (home)   Work: @WORKPHONE @       Medication refill request, see above. Thank you   Patient has been informed that medication refill requests should be called in up to one week prior to running out of medication.    Additional Notes:    Next Visit: 03/10/24

## 2024-02-11 ENCOUNTER — Telehealth: Payer: Self-pay | Admitting: Physical Medicine and Rehabilitation

## 2024-02-11 ENCOUNTER — Ambulatory Visit (INDEPENDENT_AMBULATORY_CARE_PROVIDER_SITE_OTHER): Payer: Self-pay | Admitting: Nurse Practitioner

## 2024-02-11 LAB — URINE MICROALBUMIN, RANDOM
Creatinine, UR: 28.4 mg/dL
Microalb/Crt. Ratio: 25 mg/g{creat} (ref 0–29)
Microalbumin, UR: 7.2 ug/mL

## 2024-02-11 NOTE — Telephone Encounter (Signed)
 Patient called and wants to get scheduled for neck injections. CB#[phone]

## 2024-02-12 ENCOUNTER — Ambulatory Visit (INDEPENDENT_AMBULATORY_CARE_PROVIDER_SITE_OTHER)

## 2024-02-12 DIAGNOSIS — I10 Essential (primary) hypertension: Secondary | ICD-10-CM

## 2024-02-12 DIAGNOSIS — E119 Type 2 diabetes mellitus without complications: Secondary | ICD-10-CM

## 2024-02-12 LAB — BASIC METABOLIC PANEL
BUN / Creatinine Ratio: 21 (ref 12–28)
BUN: 19 mg/dL (ref 8–27)
CO2: 21 mmol/L (ref 20–29)
Calcium: 9.7 mg/dL (ref 8.7–10.3)
Chloride: 101 mmol/L (ref 96–106)
Creatinine: 0.92 mg/dL (ref 0.57–1.00)
Glucose: 128 mg/dL — ABNORMAL HIGH (ref 70–99)
Potassium: 4.3 mmol/L (ref 3.5–5.2)
Sodium: 139 mmol/L (ref 134–144)
eGFR: 64 mL/min/1.73 (ref >59)

## 2024-02-14 ENCOUNTER — Encounter: Payer: Self-pay | Admitting: Family Medicine

## 2024-02-14 ENCOUNTER — Other Ambulatory Visit (INDEPENDENT_AMBULATORY_CARE_PROVIDER_SITE_OTHER): Payer: Self-pay | Admitting: Family

## 2024-02-14 DIAGNOSIS — K219 Gastro-esophageal reflux disease without esophagitis: Secondary | ICD-10-CM

## 2024-02-18 ENCOUNTER — Encounter (INDEPENDENT_AMBULATORY_CARE_PROVIDER_SITE_OTHER): Payer: Self-pay | Admitting: Family

## 2024-02-19 ENCOUNTER — Encounter: Payer: Self-pay | Admitting: Physical Medicine and Rehabilitation

## 2024-02-19 ENCOUNTER — Ambulatory Visit (INDEPENDENT_AMBULATORY_CARE_PROVIDER_SITE_OTHER): Payer: PRIVATE HEALTH INSURANCE | Admitting: Physical Medicine and Rehabilitation

## 2024-02-19 ENCOUNTER — Ambulatory Visit

## 2024-02-19 DIAGNOSIS — G4486 Cervicogenic headache: Secondary | ICD-10-CM | POA: Diagnosis not present

## 2024-02-19 DIAGNOSIS — M5412 Radiculopathy, cervical region: Secondary | ICD-10-CM

## 2024-02-19 DIAGNOSIS — M47812 Spondylosis without myelopathy or radiculopathy, cervical region: Secondary | ICD-10-CM | POA: Diagnosis not present

## 2024-02-19 NOTE — Progress Notes (Signed)
 Pain Scale   Average Pain 7 Patient advising that she has chronic neck pain radiating bilaterally to both shoulders and at times she get a headache from her neck pain.        +Driver, -BT, -Dye Allergies.

## 2024-02-19 NOTE — Progress Notes (Signed)
 April Beck - 78 y.o. female MRN [id number]  Date of birth: 1945-10-17  Office Visit Note: Visit Date: 02/19/2024 PCP: Austine Lefort, MD Referred by: Austine Lefort, MD  Subjective: Chief Complaint  Patient presents with   Neck - Pain   HPI: April Beck is a 78 y.o. female who comes in today as a self referral for evaluation of chronic, worsening and severe bilateral neck pain radiating up to right side of head, intermittent pain radiating to shoulders and down both arms to elbows. Also reports associated headaches. We have seen her in the past for lumbar issues. Most significant discomfort is localized to bilateral neck. Pain ongoing for about 1 year. Reports difficulty turning her neck from side to side. States she feels like she is being stabbed when she turns her head side to side. Her pain is constant, no specific aggravating factors. She describes pain as sore and aching sensation, currently rates as 8 out of 10. Some relief of pain with home exercise regimen, rest and use of medications. History dedicated formal physical therapy for her neck in 2024, no relief of pain. CT of cervical spine from 2024 shows straightening of cervical spine, there are degenerative changes noted at the level of C1-C2, more prominent on the right. No history of cervical injections/surgery. Patient denies focal weakness, numbness and tingling. No recent trauma or falls.      Review of Systems  Musculoskeletal:  Positive for myalgias and neck pain.  Neurological:  Positive for headaches. Negative for tingling, sensory change, focal weakness and weakness.  All other systems reviewed and are negative.  Otherwise per HPI.  Assessment & Plan: Visit Diagnoses:    ICD-10-CM   1. Radiculopathy, cervical region  M54.12 MR CERVICAL SPINE WO CONTRAST    2. Facet arthropathy, cervical  M47.812 MR CERVICAL SPINE WO CONTRAST    3. Cervicogenic headache  G44.86 MR CERVICAL SPINE WO CONTRAST        Plan: Findings:  Chronic, worsening and severe bilateral neck pain radiating up to right side of head, intermittent pain radiating to shoulders and down both arms to elbows. Associated headaches as well. Bilateral neck pain is biggest pain generator. Patient continues to have severe pain despite good conservative therapies such as formal physical therapy, home exercise regimen, rest and use of medications.  Patient's clinical presentation and exam are complex.  Her axial neck pain is most consistent with facet mediated pain.  She does have severe pain with side-to-side movement specifically right sided rotation.  Pain radiating to shoulders and down arms could be more radicular in nature.  We discussed treatment plan in detail today.  Next step is to obtain cervical MRI imaging.  Depending on results of MRI imaging we discussed the possibility of cervical injections.  No red flag symptoms noted upon exam today.  As a stated earlier we have seen her in the past for more lower back issues.  I did evaluate her in March 2024 and ordered lumbar MRI imaging.  She elected to hold on imaging at that time as she was feeling better.  Should we see her back for lower back pain would recommend reordering lumbar MRI imaging.    Meds & Orders: No orders of the defined types were placed in this encounter.   Orders Placed This Encounter  Procedures   MR CERVICAL SPINE WO CONTRAST    Follow-up: Return for Cervical MRI review.   Procedures: No procedures performed  Clinical History: CT CERVICAL SPINE FINDINGS   Alignment: There is straightening of the normal cervical lordosis.   Skull base and vertebrae: No acute fracture. No primary bone lesion or focal pathologic process. There is mild asymmetric degenerative change the C1-C2 articulation on the right.   Soft tissues and spinal canal: No prevertebral fluid or swelling. No visible canal hematoma.   Disc levels:  No evidence of high-grade spinal  canal stenosis.   Upper chest: Negative.   Other: None   IMPRESSION: 1. No acute intracranial abnormality. Sequela of mild chronic microvascular ischemic change. 2. No acute fracture or traumatic malalignment of the cervical spine.     Electronically Signed   By: Clora Dane M.D.   On: 10/28/2022 11:13   She reports that she has never smoked. She has never used smokeless tobacco. No results for input(s): HGBA1C, LABURIC in the last 8760 hours.  Objective:  VS:  HT:    WT:   BMI:     BP:   HR: bpm  TEMP: ( )  RESP:  Physical Exam Vitals and nursing note reviewed.  HENT:     Head: Normocephalic and atraumatic.     Right Ear: External ear normal.     Left Ear: External ear normal.     Nose: Nose normal.     Mouth/Throat:     Mouth: Mucous membranes are moist.   Eyes:     Extraocular Movements: Extraocular movements intact.    Cardiovascular:     Rate and Rhythm: Normal rate.     Pulses: Normal pulses.  Pulmonary:     Effort: Pulmonary effort is normal.  Abdominal:     General: Abdomen is flat. There is no distension.   Musculoskeletal:        General: Tenderness present.     Cervical back: Tenderness present.     Comments: Discomfort noted with side-to-side rotation, specifically right sided rotation. Patient has good strength in the upper extremities including 5 out of 5 strength in wrist extension, long finger flexion and APB. Shoulder range of motion is full bilaterally without any sign of impingement. There is no atrophy of the hands intrinsically. Sensation intact bilaterally. Negative Hoffman's sign. Negative Spurling's sign.      Skin:    General: Skin is warm and dry.     Capillary Refill: Capillary refill takes less than 2 seconds.   Neurological:     General: No focal deficit present.     Mental Status: She is alert and oriented to person, place, and time.   Psychiatric:        Mood and Affect: Mood normal.        Behavior: Behavior normal.      Ortho Exam  Imaging: No results found.  Past Medical/Family/Surgical/Social History: Medications & Allergies reviewed per EMR, new medications updated. Patient Active Problem List   Diagnosis Date Noted   Cerumen impaction 09/16/2023   Acute sinusitis 09/16/2023   Suburethral sling present 04/25/2023   Arthritis of carpometacarpal [facility]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 01/16/2022   Ovarian cyst 01/16/2021   Chronic constipation 11/21/2020   CKD (chronic kidney disease) stage 3, GFR 30-59 ml/min (HCC) 11/21/2020   Herpes 11/21/2020   Hypokalemia 11/21/2020   Weakness 11/17/2020   Aspiration into airway    Fall    Dysphagia 06/14/2013   Atrophic vaginitis 06/14/2013   Hypercholesteremia    Hypertension    GERD (gastroesophageal reflux disease)    Hypothyroidism    Renal  insufficiency 06/19/2012   Past Medical History:  Diagnosis Date   Anxiety    hx of panic attack   Arthritis    hands & knees   CKD (chronic kidney disease), stage III (HCC)    followed by pcp   Closed fracture of left distal radius 10/31/2021   Edema of both lower extremities    GERD (gastroesophageal reflux disease)    History of 2019 novel coronavirus disease (COVID-19) 09/17/2020   positive result in epic,  per pt very mild symtpoms that resolved   History of uterine fibroid    Hypertension    followed by pcp   Hypomagnesemia    Hypothyroidism    followed by pcp   IDA (iron deficiency anemia)    Osteopenia    Ovarian cyst    Pelvic pain    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, takes meloxicam    Wears contact lenses    Family History  Problem Relation Age of Onset   Colon polyps Brother    Breast cancer Maternal Grandmother    Diabetes Brother    Hypertension Brother    Colon cancer Neg Hx    Esophageal cancer Neg Hx    Rectal cancer Neg Hx    Stomach cancer Neg Hx    Past Surgical History:  Procedure Laterality Date   ABDOMINAL HYSTERECTOMY  1982   CATARACT  EXTRACTION Bilateral    CHOLECYSTECTOMY  06/20/2012   Procedure: LAPAROSCOPIC CHOLECYSTECTOMY;  Surgeon: Cloyce Darby, MD;  Location: [facility];  Service: General;  Laterality: N/A;   COLONOSCOPY  last one 03-20-2019  dr Howard Macho   DIAGNOSTIC LAPAROSCOPY  yrs ago   HIP ARTHROPLASTY Right 11/13/2020   Procedure: ARTHROPLASTY BIPOLAR HIP (HEMIARTHROPLASTY);  Surgeon: Darrin Emerald, MD;  Location: [facility];  Service: Orthopedics;  Laterality: Right;   INCONTINENCE SURGERY  09-11-2005   dr Inga Manges  @[facility]    LYNX SLING   LAPAROSCOPIC SALPINGO OOPHERECTOMY Bilateral 01/16/2021   Procedure: DIAGNOSTIC LAPAROSCOPY;  Surgeon: Woodrow Hazy, MD;  Location: [facility];  Service: Gynecology;  Laterality: Bilateral;   LAPAROSCOPY Bilateral 01/16/2021   Procedure: EXPLORATORY LAPAROTOMY BILATERAL SALPINGO OOPHORECTOMY;  Surgeon: Woodrow Hazy, MD;  Location: [facility];  Service: Gynecology;  Laterality: Bilateral;   TUBAL LIGATION     Social History   Occupational History   Not on file  Tobacco Use   Smoking status: Never   Smokeless tobacco: Never  Vaping Use   Vaping status: Never Used  Substance and Sexual Activity   Alcohol use: No   Drug use: Never   Sexual activity: Not Currently    Birth control/protection: Surgical

## 2024-02-19 NOTE — Progress Notes (Signed)
 02/19/24 1100   Pain History   Pain Symptoms Pain   Pain Location Lower Back;Hip  Right;Hip  Left   Pain Description Sharp/Stabbing;Radiating   Pain Frequency Constant   Incontinence No   Specific event causing symptoms? Unknown   Treatments   Have you visited a chiropractor for this problem? No   Have you ever had physical therapy for this problem? No   Have you ever received an injection for this problem? No   Care Management   How did you hear about our program MD Referral  (Ref.  by KempGLENWOOD Leni Hildegard GORMAN. Adil, MD)   Have you had any prior spine surgeries? No   ISP Appointment   ISP Physician Pereira, Danyelle   ISP Appointment Date 03/27/24  (1:00 PM)   ISP Appointment Location [facility]

## 2024-02-29 ENCOUNTER — Ambulatory Visit
Admission: RE | Admit: 2024-02-29 | Discharge: 2024-02-29 | Disposition: A | Discharge status: Home / Self Care | Payer: PRIVATE HEALTH INSURANCE | Source: Ambulatory Visit | Attending: Physical Medicine and Rehabilitation | Admitting: Physical Medicine and Rehabilitation

## 2024-02-29 ENCOUNTER — Other Ambulatory Visit (INDEPENDENT_AMBULATORY_CARE_PROVIDER_SITE_OTHER): Payer: Self-pay | Admitting: Family

## 2024-02-29 DIAGNOSIS — I10 Essential (primary) hypertension: Secondary | ICD-10-CM

## 2024-02-29 DIAGNOSIS — M4802 Spinal stenosis, cervical region: Secondary | ICD-10-CM | POA: Diagnosis not present

## 2024-02-29 DIAGNOSIS — M542 Cervicalgia: Secondary | ICD-10-CM | POA: Diagnosis not present

## 2024-02-29 DIAGNOSIS — M4312 Spondylolisthesis, cervical region: Secondary | ICD-10-CM | POA: Diagnosis not present

## 2024-02-29 DIAGNOSIS — M4692 Unspecified inflammatory spondylopathy, cervical region: Secondary | ICD-10-CM | POA: Diagnosis not present

## 2024-03-02 ENCOUNTER — Ambulatory Visit: Payer: PRIVATE HEALTH INSURANCE | Admitting: Orthopedic Surgery

## 2024-03-07 ENCOUNTER — Ambulatory Visit (INDEPENDENT_AMBULATORY_CARE_PROVIDER_SITE_OTHER): Payer: Self-pay

## 2024-03-07 DIAGNOSIS — I10 Essential (primary) hypertension: Secondary | ICD-10-CM

## 2024-03-07 DIAGNOSIS — E119 Type 2 diabetes mellitus without complications: Secondary | ICD-10-CM

## 2024-03-09 ENCOUNTER — Telehealth: Payer: Self-pay | Admitting: Physical Medicine and Rehabilitation

## 2024-03-09 NOTE — Telephone Encounter (Signed)
 Pt checking the status of the mri

## 2024-03-10 ENCOUNTER — Ambulatory Visit: Payer: PRIVATE HEALTH INSURANCE | Admitting: Family Medicine

## 2024-03-10 ENCOUNTER — Ambulatory Visit (INDEPENDENT_AMBULATORY_CARE_PROVIDER_SITE_OTHER): Admitting: Nurse Practitioner

## 2024-03-11 ENCOUNTER — Other Ambulatory Visit (INDEPENDENT_AMBULATORY_CARE_PROVIDER_SITE_OTHER): Payer: Self-pay | Admitting: Family

## 2024-03-11 DIAGNOSIS — E7800 Pure hypercholesterolemia, unspecified: Secondary | ICD-10-CM

## 2024-03-20 ENCOUNTER — Other Ambulatory Visit: Payer: PRIVATE HEALTH INSURANCE

## 2024-03-23 ENCOUNTER — Encounter (INDEPENDENT_AMBULATORY_CARE_PROVIDER_SITE_OTHER): Payer: Self-pay | Admitting: Nurse Practitioner

## 2024-03-23 ENCOUNTER — Ambulatory Visit (INDEPENDENT_AMBULATORY_CARE_PROVIDER_SITE_OTHER): Admitting: Nurse Practitioner

## 2024-03-23 VITALS — BP 117/68 | HR 69 | Temp 98.4°F | Resp 16 | Ht 60.5 in | Wt 155.2 lb

## 2024-03-23 DIAGNOSIS — E1165 Type 2 diabetes mellitus with hyperglycemia: Secondary | ICD-10-CM

## 2024-03-23 DIAGNOSIS — E119 Type 2 diabetes mellitus without complications: Secondary | ICD-10-CM

## 2024-03-23 DIAGNOSIS — Z794 Long term (current) use of insulin: Secondary | ICD-10-CM

## 2024-03-23 MED ORDER — TRADJENTA 5 MG PO TABS
1.0000 | ORAL_TABLET | Freq: Every day | ORAL | Status: DC
Start: 2024-03-23 — End: 2024-05-19

## 2024-03-23 NOTE — Patient Instructions (Signed)
 Continue Tradjenta  5 mg daily  Continue Metformin  1000 mg twice daily  Continue Glipizide  20 mg twice daily  Continue Jardiance  25 mg daily   Do labs today  Check blood sugar twice daily in the morning and at bedtime and bring blood sugar log to the next appointment  Follow up in 6 months     I provided the patient information on carbohydrate content of foods, instructed to maintain a low carbohydrate diet of 30-45g/meal, + 30g for snacks /day, and to maximize foods high in protein and vegetables.  I discussed with the patient blood glucose targets, HgbA1c goals, and the importance of timing of SMBGs at home.  I discussed medication treatment options, insulin  injection technique, and insulin  action.  Finally, I reviewed with the patient  hypoglycemia (BG < 70), hypoglycemic symptoms and management of hypoglycemia (rule of 15s).  HOW TO TREAT HYPOGLYCEMIA (LOW BLOOD SUGAR) LESS THAN 70 MG/DL:  Follow rule of 15 for treating low blood sugar less than 70 mg/dl.     1.) Take a 15 gram rapidly acting carbohydrates in the form of half cup fruit juice or 3-4 glucose tablets or 1 tube of glucose gel, available over the counter at the pharmacy  2.) Check blood sugar in 15 minutes  3.) If blood sugar is less than 70 mg/dl, repeat the rule of 15 as in step 1.  4.) Once blood sugar is greater than 70 mg/dl, eat a balanced meal or a snack with starch, protein and fat. For example, 4-5 peanut butter crackers or 4-5 cheese crackers or fiber bar.     NOTIFY US  - if you continue to have blood sugars less than 70 mg/dl.

## 2024-03-23 NOTE — Progress Notes (Signed)
 Chief Complaint:  Follow up DM II    HPI:  Helen [name] is a 78 y.o. female with PMH Asthma, HTN, diabetes  who presents for follow up management of  type 2 diabetes.  Diabetes type 2 was diagnosed in 1991 on routine testing, and has been managed by PCP.      In terms of severity with regards to complications, the patient does not have retinopathy, does not have nephropathy, does not have neuropathy sees podiatrist for callyces every one to two weeks and will see next week and does not have cardiovascular disease.                 With regards to the quality of blood sugar control, the most recent hemoglobin A1c test was 7.4% on 03/23/24,  7.2% on 09/17/23, 7.5% on 03/15/23,  7.6 on 11/14/22.  The patient checks blood sugars one to two times per day  Current anti-hyperglycemic regimen is comprised of Tradjenta  5 mg daily, metformin  1000 mg bid, Glipizide  20 mg bid, Jardiance  25 mg daily .                 In terms of associated symptoms at this time, the patient does not have polyuria, does not have blurred vision, does not have paresthesias,  With regards to gastroparesis the patient does not have nausea and does not have diarrhea     Per the patient's history, last fundoscopic exam was in May 2025 . 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      .   Currently the patient's diet comprises of 2 meals per day.       Patient has not experienced any hypoglycemia symptoms or events during the previous months.     Diabetes Medications:  Metformin  1000 mg twice daily  Glipizide  20 mg twice daily  Jardiance  25 mg daily  Tradjenta  5 mg daily    Diabetes Complications:  Podiatry: no neuropathy  Renal: None     Lab Results   Component Value Date    CREAT 0.92 02/10/2024    CREAT 0.89 10/28/2023      Latest Reference Range & Units 11/14/22 00:00   Creatinine, UR Not Estab. mg/dL 62.0      Latest Reference Range & Units 02/10/24 00:00   Creatinine, UR Not Estab. mg/dL 71.5      Ophthalmology: no retinopathy, no glaucoma, + cataracts, last visit in May 2025 does not need cataract surgery at this time   Cardiovascular: none    Lipids: TC 138 trig 58, LDL 62, HDL 64   Latest Reference Range & Units 11/14/22 00:00   Cholesterol 100 - 199 mg/dL 870   HDL >60 mg/dL 67   LDL Calculated 0 - 99 mg/dL 51   Triglycerides 0 - 149 mg/dL 47   Cholesterol / HDL Ratio 0.0 - 4.4 ratio 1.9   VLDL Calculated 5 - 40 mg/dL 11      Latest Reference Range & Units 09/18/23 00:00   Cholesterol 100 - 199 mg/dL 853   HDL >60 mg/dL 68   LDL Calculated 0 - 99 mg/dL 66   Triglycerides 0 - 149 mg/dL 55   Cholesterol / HDL Ratio 0.0 - 4.4 ratio 2.1   VLDL Calculated 5 - 40 mg/dL 12     Blood pressure: 117/68    Self-monitoring blood glucose:    Did not bring any blood sugar log today  Fasting: 11, 109,  121, 149, 118, 119    24 Hour diet recall:  1) Breakfast: Scrambled eggs, [place] bacon and piece of wheat toast   2) Lunch: skip lunch, in the afternoon yogurt and piece of fruit like strawberries and grapes   3) Dinner: Fish and sweet potatoes, vegetables green beans and broccoli   Snacks: Granola bar with no sugar and cholesterol reading labels   Beverages: Water, lemonade with no sugar, tea, decaf coffee with 1% milk     Exercise:   Regular exercise walking three times a week 1 hour each time      Labs:  Lab Results   Component Value Date    HGBA1C 7.2 (A) 09/17/2023    HGBA1C 7.5 (A) 03/15/2023    HGBA1C 7.6 (H) 11/14/2022          Problem List:  Patient Active Problem List   Diagnosis    Hypertension    Hyperlipidemia    H/O total hysterectomy with removal of both tubes and ovaries    Heart block AV first degree    RBBB (right bundle branch block)    Palpitations    Pure hypercholesterolemia, unspecified    Nonspecific abnormal electrocardiogram (ECG) (EKG)    Type 2 diabetes mellitus without complication, without long-term current use of insulin  (CMS/HCC)    Type 2 diabetes mellitus with hyperglycemia (CMS/HCC)     Anemia    History of colonic polyps    Gastroesophageal reflux disease, unspecified whether esophagitis present    Osteopenia after menopause    Stage 2 chronic kidney disease       Current Medications:  Current Outpatient Medications on File Prior to Visit   Medication Sig Dispense Refill    amLODIPine  (NORVASC ) 10 MG tablet TAKE 1 TABLET (10 MG) BY MOUTH DAILY. 90 tablet 0    fluticasone  (FLOVENT  HFA) 220 MCG/ACT inhaler INHALE 1 PUFF INTO THE LUNGS TWICE A DAY 24 each 2    glipiZIDE  (GLUCOTROL ) 10 MG tablet Take 2 tablets (20 mg) by mouth 2 (two) times daily before meals 360 tablet 1    Glucagon , rDNA, (Glucagon  Emergency) 1 MG Kit Inject 1 mg into the skin once as needed (severe hypoglycemia) 1 kit 1    Jardiance  25 MG tablet TAKE 1 TABLET BY MOUTH EVERY MORNING 90 tablet 1    Lancets (onetouch ultrasoft) lancets Use to check blood sugars 2 times daily 200 each 3    lidocaine  (Lidoderm ) 5 % Place 1 patch onto the skin daily as needed (pain) Remove & Discard patch within 12 hours 30 patch 0    metFORMIN  (GLUCOPHAGE -XR) 500 MG 24 hr tablet TAKE 2 TABLETS (1,000 MG) BY MOUTH TWICE A DAY 360 tablet 1    montelukast (SINGULAIR) 10 MG tablet Take 1 tablet (10 mg) by mouth daily      NASONEX 50 MCG/ACT nasal spray 2 sprays by Nasal route daily as needed  5    OneTouch Ultra test strip TWICE A DAY 200 strip 3    pantoprazole  (PROTONIX ) 40 MG tablet TAKE 1 TABLET BY MOUTH EVERY DAY 90 tablet 1    valsartan -hydroCHLOROthiazide  (DIOVAN -HCT) 160-12.5 MG per tablet TAKE 1 TABLET BY MOUTH EVERY DAY 90 tablet 0    albuterol  (PROVENTIL ) (2.5 MG/3ML) 0.083% nebulizer solution TAKE 3 MLS (2.5 MG TOTAL) BY NEBULIZATION EVERY 4 (FOUR) HOURS AS NEEDED FOR SHORTNESS OF BREATH OR WHEEZING 75 mL 1    albuterol  sulfate HFA (PROVENTIL ) 108 (90 Base) MCG/ACT inhaler INHALE 2 PUFFS INTO  THE LUNGS EVERY 4 HOURS AS NEEDED FOR WHEEZING OR SHORTNESS OF BREATH. 6.7 each 2    atorvastatin  (LIPITOR) 40 MG tablet TAKE 1 TABLET BY MOUTH EVERY  DAY 90 tablet 0    azelastine  (ASTELIN ) 0.1 % nasal spray 2 sprays by Nasal route 2 (two) times daily Use in each nostril as directed 30 mL 1    Blood Glucose Monitoring Suppl (ONE TOUCH ULTRA 2) W/DEVICE KIT One daily 1 each 0    cetirizine (ZyrTEC) 10 MG tablet Take 1 tablet (10 mg) by mouth once daily      Cholecalciferol (Vitamin D3) 2000 UNIT capsule TAKE 1 CAPSULE BY MOUTH EVERY DAY 90 capsule 1    dexAMETHasone (DECADRON) 0.1 % ophthalmic solution Place 1 drop into both eyes      EPINEPHrine  (EPIPEN  2-PAK) 0.3 MG/0.3ML Solution Auto-injector injection Inject 0.3 mLs (0.3 mg) into the muscle once as needed (Anaphylaxis) 1 each 3    EPINEPHrine  0.3 MG/0.3ML auto-injector 1 DOSE IF NEEDED FOR ANAPHYLAXIS      ferrous sulfate 325 (65 FE) MG tablet Take 1 tablet (325 mg) by mouth every morning with breakfast      fluticasone  (FLONASE ) 50 MCG/ACT nasal spray 2 sprays by Nasal route daily      linaGLIPtin  (Tradjenta ) 5 MG Tablet Take 1 tablet (5 mg) by mouth once daily (Patient not taking: Reported on 03/23/2024) 90 tablet 0     No current facility-administered medications on file prior to visit.       Allergies:  Allergies   Allergen Reactions    Aspirin      Break out in sweat and dizziness     Other Environmental     Peppers     Pollen Extract     Latex Itching and Rash       Past Medical History:  Past Medical History:   Diagnosis Date    Anemia     iron    currently on medication      Arthritis     general body      Arthritis     Generalized    Asthma     Asthma without status asthmaticus     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    Bilateral cataracts     Breast cancer (CMS/HCC)     Breast lump     Cancer (CMS/HCC)     breast cancer    Carcinoma (CMS/HCC)     Carcinoma of the bladder    Chicken pox     Childhood illness    Conduction Disorder     RBBB    Diabetes mellitus type II     Non-Insulin  dependent    Echocardiogram 10/2013    Gastroesophageal reflux disease     Heart murmur      Hemorrhoids without complication     Holter monitor 02/2014    Hyperlipidemia     Hypertensive disorder     Malignant neoplasm of breast (CMS/HCC)     Measles     Childhood illness    Mumps     Childhood illness    Myocardial perfusion scan 11/2013, 12/2013    MPI single Isotope excrise; MPI dual Isotope Lexiscan     Rash     RBBB     Spinal stenosis     Stage 2 chronic kidney disease 10/29/2023    Syncope        Past Surgical History:  Past Surgical  History:   Procedure Laterality Date    ABLATION OF DYSRHYTHMIC FOCUS      ARTHROSCOPY, KNEE Left 04/21/2015    Procedure: ARTHROSCOPY, KNEE PARTIAL MEDIAL AND LATERAL MENISECTOMY LEFT KNEE;  Surgeon: Evie Shlomo CROME, MD;  Location: [facility];  Service: Orthopedics;  Laterality: Left;  partial medial menisectomy    BREAST BIOPSY      COLONOSCOPY, SCREENING N/A 12/07/2022    Procedure: COLONOSCOPY, SCREENING;  Surgeon: Vernia Mabel HERO, MD;  Location: [facility];  Service: Gastroenterology;  Laterality: N/A;    EGD N/A 12/07/2022    Procedure: EGD;  Surgeon: Vernia Mabel HERO, MD;  Location: [facility];  Service: Gastroenterology;  Laterality: N/A;    HAND SURGERY      HYSTERECTOMY      total    KNEE SURGERY      TUMOR REMOVAL Left     neck  benign        Family History:  Family History   Problem Relation Name Age of Onset    Diabetes Mother Metztli Sachdev     Diabetes Sister Lincey Clyburn     Diabetes Sister Reena Oris     Malignant hyperthermia Neg Hx      Pseudochol deficiency Neg Hx      Anesthesia problems Neg Hx         Social History:  Social History     Socioeconomic History    Marital status: Divorced   Tobacco Use    Smoking status: Never     Passive exposure: Never    Smokeless tobacco: Never   Vaping Use    Vaping status: Never Used   Substance and Sexual Activity    Alcohol use: No    Drug use: Never    Sexual activity: Not Currently     Social Drivers of Health     Financial Resource Strain: Low Risk  (11/04/2023)    Overall Financial Resource Strain  (CARDIA)     Difficulty of Paying Living Expenses: Not hard at all   Food Insecurity: Food Insecurity Present (11/04/2023)    Hunger Vital Sign     Worried About Running Out of Food in the Last Year: Sometimes true     Ran Out of Food in the Last Year: Never true   Transportation Needs: No Transportation Needs (11/04/2023)    PRAPARE - Therapist, art (Medical): No     Lack of Transportation (Non-Medical): No   Physical Activity: Insufficiently Active (11/04/2023)    Exercise Vital Sign     Days of Exercise per Week: 3 days     Minutes of Exercise per Session: 30 min   Stress: No Stress Concern Present (11/04/2023)    [employer] of Occupational Health - Occupational Stress Questionnaire     Feeling of Stress : Not at all   Social Connections: Socially Isolated (11/04/2023)    Social Connection and Isolation Panel     Frequency of Communication with Friends and Family: Once a week     Frequency of Social Gatherings with Friends and Family: Once a week     Attends Religious Services: More than 4 times per year     Active Member of [employer] or Organizations: No     Attends Banker Meetings: Never     Marital Status: Divorced   Catering manager Violence: Not At Risk (11/04/2023)    Humiliation, Afraid, Rape, and Kick questionnaire     Fear  of Current or Ex-Partner: No     Emotionally Abused: No     Physically Abused: No     Sexually Abused: No   Housing Stability: Unknown (11/04/2023)    Housing Stability Vital Sign     Unable to Pay for Housing in the Last Year: No     Homeless in the Last Year: No       There were no vitals taken for this visit.       BP Readings from Last 3 Encounters:   02/10/24 118/69   11/12/23 143/73   10/28/23 138/72        Wt Readings from Last 4 Encounters:   02/10/24 1115 70.3 kg (155 lb)   11/12/23 1035 70.7 kg (155 lb 12.8 oz)   09/17/23 1107 70.7 kg (155 lb 12.8 oz)   07/31/23 1321 72.2 kg (159 lb 3.2 oz)       Physical Exam:  GENERAL APPEARANCE: alert, in no  acute distress, well developed, well nourished  PSYCHIATRIC: normal mood, appropriate affect  Pulmonary: Breathing normally with no wheezes  Neuro: A/O X 3  NECK/THYROID : neck supple, no cervical lymphadenopathy, no thyromegaly, no palpable thyroid  nodules  HEART: S1, S2 normal, no murmurs, regular rate and rhythm  LUNGS: clear to auscultation bilaterally, no wheezes, rales, rhonchi  ABDOMEN: soft, NTND, nl BS, no hepatomegaly, no splenomegaly  EXTREMITIES: no clubbing, cyanosis, or edema B/L  NEUROLOGY: normal UE reflexes b/l, no hand tremor b/l  SKIN: No acanthosis nigricans, no purple striae, no hirsutism  PSYCHIATRIC: normal mood, appropriate affect  DIABETIC FOOT EXAM: Sees podiatrist       Assessment/Plan:  Kacelyn Rowzee is a 78 y.o. female with Diabetes Mellitus Type II without long term current use of Insulin  with no complications     1. Controlled type 2 diabetes mellitus without complication, without long-term current use of insulin   Diabetes type 2:    Based on blood sugar pattern of descent fasting sugars and unknown prandial/post-prandial sugars will continue medications as follows: continue metformin  1000 mg bid with meals, Continue Glipizide  20 mg bid, Continue Tradjenta  5 mg daily and Jardiance  25 mg daily.  Check sugars AM fasting and 2 hours after biggest meal of day with goal fasting/pre-meal range of 80-130; goal postprandial range of 80-160.  ;  Continue to see podiatry for foot care.  Continue controlled carbohydrate diet and exercise daily.   Bring blood sugar log to the next appointment     2. Hypertension (stable): goal blood pressure is <130/80.    Continue Valsartan - HCT 160-12.5 mg daily  Continue Amlodipine  10 mg daily     3. Hyperlipidemia (stable): controlled on Lipitor 40 mg daily; goal LDL<100.  Continue low cholesterol, low saturated fat diet.  Continue Lipitor 40 mg daily     4. Overweight status:  counseled patient on at least 150 min/wk of moderate-intensity exercise and  consider flexibility/strength training exercises if possible.  Physical activity programs should begin slowly and build up gradually; nutritional counseling status.     5. Hypoglycemia management : counseled patient on need to keep sugar source with her at all times and medic-alert bracelet/indicator specifying diagnosis of diabetes  Educated patient about hypoglycemia symptoms and strategies for management of hypoglycemia  Up to date with Glucagon  emergency kit         Follow up in 6 months.     Helen JINNY Cruel, NP

## 2024-03-24 ENCOUNTER — Other Ambulatory Visit: Payer: PRIVATE HEALTH INSURANCE

## 2024-03-24 ENCOUNTER — Ambulatory Visit: Payer: PRIVATE HEALTH INSURANCE | Admitting: Family Medicine

## 2024-03-24 ENCOUNTER — Encounter: Payer: Self-pay | Admitting: Family Medicine

## 2024-03-24 ENCOUNTER — Ambulatory Visit (INDEPENDENT_AMBULATORY_CARE_PROVIDER_SITE_OTHER): Payer: Self-pay | Admitting: Nurse Practitioner

## 2024-03-24 VITALS — BP 118/70 | HR 75 | Temp 98.1°F | Ht 66.0 in | Wt 164.4 lb

## 2024-03-24 DIAGNOSIS — I10 Essential (primary) hypertension: Secondary | ICD-10-CM

## 2024-03-24 DIAGNOSIS — R5383 Other fatigue: Secondary | ICD-10-CM | POA: Diagnosis not present

## 2024-03-24 DIAGNOSIS — E038 Other specified hypothyroidism: Secondary | ICD-10-CM | POA: Diagnosis not present

## 2024-03-24 DIAGNOSIS — Z0000 Encounter for general adult medical examination without abnormal findings: Secondary | ICD-10-CM

## 2024-03-24 LAB — C-PEPTIDE: C-Peptide: 1.5 ng/mL (ref 1.1–4.4)

## 2024-03-24 LAB — LIPID PANEL
Cholesterol / HDL Ratio: 1.6 ratio (ref 0.0–4.4)
Cholesterol: 137 mg/dL (ref 100–199)
HDL: 88 mg/dL (ref >39)
LDL Chol Calculated (NIH): 36 mg/dL (ref 0–99)
Triglycerides: 60 mg/dL (ref 0–149)
VLDL Calculated: 13 mg/dL (ref 5–40)

## 2024-03-24 LAB — GLUCOSE, FASTING: Glucose: 111 mg/dL — ABNORMAL HIGH (ref 70–99)

## 2024-03-24 MED ORDER — PREDNISONE 20 MG PO TABS: ORAL_TABLET | ORAL | 0 refills | Status: DC

## 2024-03-24 NOTE — Progress Notes (Signed)
 Subjective:    Patient ID: April Beck, female    DOB: [birth year], 78 y.o.   MRN: [id number]  Patient is a 78 year old Caucasian female with a history of chronic kidney disease and hypertension who is here today for physical exam.  Her last colonoscopy was in 2020.  She is due for repeat colonoscopy in 2030.  At that time she will be 78 years old.  Therefore she does not require another colonoscopy.  Patient gets her pelvic exam from her gynecologist.  She is scheduled to see them in the fall.  They performed her mammogram.  They also performed a bone density test.  Her most recent lab work was checked in May and was outstanding.  She does report fatigue.  She states that she gets tired easily and feels lightheaded occasionally especially when she is outside when it is hot.  Her blood pressure today is relatively low given her age.  Immunization History  Administered Date(s) Administered   Fluad Quad(high Dose 65+) 05/21/2019, 06/19/2021, 06/15/2022   Influenza Split 06/20/2012, 06/29/2020   Influenza, High Dose Seasonal PF 06/20/2017, 07/11/2018, 06/29/2020   Influenza,inj,Quad PF,6+ Mos 06/01/2013, 05/13/2014, 06/27/2015   Influenza-Unspecified 06/18/2023   Moderna Sars-Covid-2 Vaccination 02/07/2024   PFIZER(Purple Top)SARS-COV-2 Vaccination 11/30/2019, 12/23/2019, 06/07/2020   Pfizer Covid-19 Vaccine Bivalent Booster 67yrs & up 07/04/2021   Pfizer(Comirnaty)Fall Seasonal Vaccine 12 years and older 06/20/2022, 06/18/2023   Pneumococcal Conjugate-13 10/23/2013   Pneumococcal Polysaccharide-23 06/20/2012   Respiratory Syncytial Virus Vaccine,Recomb Aduvanted(Arexvy) 06/27/2022   Tdap 06/20/2017   Zoster Recombinant(Shingrix ) 08/29/2017, 12/13/2017   Zoster, Live 08/04/2009   No visits with results within 2 Month(s) from this visit.  Latest known visit with results is:  Office Visit on 01/02/2024  Component Date Value Ref Range Status   WBC 01/02/2024 4.5  3.8 - 10.8 Thousand/uL  Final   RBC 01/02/2024 4.03  3.80 - 5.10 Million/uL Final   Hemoglobin 01/02/2024 12.6  11.7 - 15.5 g/dL Final   HCT 94/98/7974 38.9  35.0 - 45.0 % Final   MCV 01/02/2024 96.5  80.0 - 100.0 fL Final   MCH 01/02/2024 31.3  27.0 - 33.0 pg Final   MCHC 01/02/2024 32.4  32.0 - 36.0 g/dL Final   Comment: For adults, a slight decrease in the calculated MCHC value (in the range of 30 to 32 g/dL) is most likely not clinically significant; however, it should be interpreted with caution in correlation with other red cell parameters and the patient's clinical condition.    RDW 01/02/2024 11.1  11.0 - 15.0 % Final   Platelets 01/02/2024 180  140 - 400 Thousand/uL Final   MPV 01/02/2024 9.8  7.5 - 12.5 fL Final   Neutro Abs 01/02/2024 2,016  1,500 - 7,800 cells/uL Final   Absolute Lymphocytes 01/02/2024 1,836  850 - 3,900 cells/uL Final   Absolute Monocytes 01/02/2024 468  200 - 950 cells/uL Final   Eosinophils Absolute 01/02/2024 149  15 - 500 cells/uL Final   Basophils Absolute 01/02/2024 32  0 - 200 cells/uL Final   Neutrophils Relative % 01/02/2024 44.8  % Final   Total Lymphocyte 01/02/2024 40.8  % Final   Monocytes Relative 01/02/2024 10.4  % Final   Eosinophils Relative 01/02/2024 3.3  % Final   Basophils Relative 01/02/2024 0.7  % Final   Glucose, Bld 01/02/2024 91  65 - 99 mg/dL Final   Comment: .            Fasting reference interval .  BUN 01/02/2024 16  7 - 25 mg/dL Final   Creat 94/98/7974 0.95  0.60 - 1.00 mg/dL Final   BUN/Creatinine Ratio 01/02/2024 SEE NOTE:  6 - 22 (calc) Final   Comment:    Not Reported: BUN and Creatinine are within    reference range. .    Sodium 01/02/2024 140  135 - 146 mmol/L Final   Potassium 01/02/2024 4.2  3.5 - 5.3 mmol/L Final   Chloride 01/02/2024 102  98 - 110 mmol/L Final   CO2 01/02/2024 30  20 - 32 mmol/L Final   Calcium  01/02/2024 9.5  8.6 - 10.4 mg/dL Final   Total Protein 94/98/7974 6.2  6.1 - 8.1 g/dL Final   Albumin 94/98/7974  4.1  3.6 - 5.1 g/dL Final   Globulin 94/98/7974 2.1  1.9 - 3.7 g/dL (calc) Final   AG Ratio 01/02/2024 2.0  1.0 - 2.5 (calc) Final   Total Bilirubin 01/02/2024 0.7  0.2 - 1.2 mg/dL Final   Alkaline phosphatase (APISO) 01/02/2024 46  37 - 153 U/L Final   AST 01/02/2024 20  10 - 35 U/L Final   ALT 01/02/2024 13  6 - 29 U/L Final   TSH 01/02/2024 1.14  0.40 - 4.50 mIU/L Final   Cholesterol 01/02/2024 130  <200 mg/dL Final   HDL 94/98/7974 53  > OR = 50 mg/dL Final   Triglycerides 94/98/7974 125  <150 mg/dL Final   LDL Cholesterol (Calc) 01/02/2024 56  mg/dL (calc) Final   Comment: Reference range: <100 . Desirable range <100 mg/dL for primary prevention;   <70 mg/dL for patients with CHD or diabetic patients  with > or = 2 CHD risk factors. [name] LDL-C is now calculated using the Martin-Hopkins  calculation, which is a validated novel method providing  better accuracy than the Friedewald equation in the  estimation of LDL-C.  Gladis APPLETHWAITE et al. SANDREA. 7986;689(80): 2061-2068  (http://education.[facility].com/faq/FAQ164)    Total CHOL/HDL Ratio 01/02/2024 2.5  <4.9 (calc) Final   Non-HDL Cholesterol (Calc) 01/02/2024 77  <130 mg/dL (calc) Final   Comment: For patients with diabetes plus 1 major ASCVD risk  factor, treating to a non-HDL-C goal of <100 mg/dL  (LDL-C of <29 mg/dL) is considered a therapeutic  option.     Past Medical History:  Diagnosis Date   Anxiety    hx of panic attack   Arthritis    hands & knees   CKD (chronic kidney disease), stage III (HCC)    followed by pcp   Closed fracture of left distal radius 10/31/2021   Edema of both lower extremities    GERD (gastroesophageal reflux disease)    History of 2019 novel coronavirus disease (COVID-19) 09/17/2020   positive result in epic,  per pt very mild symtpoms that resolved   History of uterine fibroid    Hypertension    followed by pcp   Hypomagnesemia    Hypothyroidism    followed by pcp   IDA (iron  deficiency anemia)    Osteopenia    Ovarian cyst    Pelvic pain    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, takes meloxicam    Wears contact lenses    Past Surgical History:  Procedure Laterality Date   ABDOMINAL HYSTERECTOMY  1982   CATARACT EXTRACTION Bilateral    CHOLECYSTECTOMY  06/20/2012   Procedure: LAPAROSCOPIC CHOLECYSTECTOMY;  Surgeon: Dann FORBES Hummer, MD;  Location: [facility];  Service: General;  Laterality: N/A;   COLONOSCOPY  last one 03-20-2019  dr teressa   DIAGNOSTIC LAPAROSCOPY  yrs ago   HIP ARTHROPLASTY Right 11/13/2020   Procedure: ARTHROPLASTY BIPOLAR HIP (HEMIARTHROPLASTY);  Surgeon: Margrette Taft BRAVO, MD;  Location: [facility];  Service: Orthopedics;  Laterality: Right;   INCONTINENCE SURGERY  09-11-2005   dr watt  @[facility]    LYNX SLING   LAPAROSCOPIC SALPINGO OOPHERECTOMY Bilateral 01/16/2021   Procedure: DIAGNOSTIC LAPAROSCOPY;  Surgeon: Johnnye Ade, MD;  Location: [facility];  Service: Gynecology;  Laterality: Bilateral;   LAPAROSCOPY Bilateral 01/16/2021   Procedure: EXPLORATORY LAPAROTOMY BILATERAL SALPINGO OOPHORECTOMY;  Surgeon: Johnnye Ade, MD;  Location[facility];  Service: Gynecology;  Laterality: Bilateral;   TUBAL LIGATION     Current Outpatient Medications on File Prior to Visit  Medication Sig Dispense Refill   acetaminophen  (TYLENOL ) 325 MG tablet Take 2 tablets (650 mg total) by mouth every 6 (six) hours as needed for pain. (Patient taking differently: Take 650 mg by mouth every 6 (six) hours as needed for pain. Arthritis formula per pt)     acyclovir  (ZOVIRAX ) 400 MG tablet TAKE 1 TABLET EVERY DAY 90 tablet 3   atorvastatin  (LIPITOR) 40 MG tablet TAKE 1 TABLET EVERY DAY 90 tablet 3   calcium -vitamin D  (OSCAL WITH D) 250-125 MG-UNIT tablet Take 1 tablet by mouth daily.     citalopram  (CELEXA ) 10 MG tablet TAKE 1 TABLET EVERY DAY 90 tablet 3   diphenoxylate -atropine  (LOMOTIL ) 2.5-0.025 MG  tablet Take 2 tablets by mouth 4 (four) times daily as needed for diarrhea or loose stools. 30 tablet 0   estradiol  (ESTRACE  VAGINAL) 0.1 MG/GM vaginal cream Place 1 Applicatorful vaginally at bedtime. 42 each 12   fluconazole  (DIFLUCAN ) 150 MG tablet Take 1 tablet (150 mg total) by mouth daily. 2 tablet 0   fluocinonide gel (LIDEX) 0.05 % Apply 1 application topically 3 (three) times daily as needed (rash).     levothyroxine  (SYNTHROID ) 50 MCG tablet TAKE 1 TABLET EVERY DAY 90 tablet 1   losartan  (COZAAR ) 50 MG tablet TAKE 1 TABLET EVERY DAY 90 tablet 3   meclizine  (ANTIVERT ) 25 MG tablet Take 1 tablet (25 mg total) by mouth 3 (three) times daily as needed. 30 tablet 0   pantoprazole  (PROTONIX ) 40 MG tablet TAKE 1 TABLET TWICE DAILY 180 tablet 1   No current facility-administered medications on file prior to visit.   No Known Allergies Social History   Socioeconomic History   Marital status: Divorced    Spouse name: Not on file   Number of children: Not on file   Years of education: Not on file   Highest education level: 12th grade  Occupational History   Not on file  Tobacco Use   Smoking status: Never   Smokeless tobacco: Never  Vaping Use   Vaping status: Never Used  Substance and Sexual Activity   Alcohol use: No   Drug use: Never   Sexual activity: Not Currently    Birth control/protection: Surgical  Other Topics Concern   Not on file  Social History Narrative   Not on file   Social Drivers of Health   Financial Resource Strain: Medium Risk (03/20/2024)   Overall Financial Resource Strain (CARDIA)    Difficulty of Paying Living Expenses: Somewhat hard  Food Insecurity: No Food Insecurity (03/20/2024)   Hunger Vital Sign    Worried About Running Out of Food in the Last Year: Never true    Ran Out of Food in the Last  Year: Never true  Transportation Needs: No Transportation Needs (03/20/2024)   PRAPARE - Administrator, Civil Service (Medical): No    Lack  of Transportation (Non-Medical): No  Physical Activity: Inactive (03/20/2024)   Exercise Vital Sign    Days of Exercise per Week: 0 days    Minutes of Exercise per Session: Not on file  Stress: No Stress Concern Present (03/20/2024)   [employer] of Occupational Health - Occupational Stress Questionnaire    Feeling of Stress: Not at all  Social Connections: Moderately Integrated (03/20/2024)   Social Connection and Isolation Panel    Frequency of Communication with Friends and Family: More than three times a week    Frequency of Social Gatherings with Friends and Family: Twice a week    Attends Religious Services: More than 4 times per year    Active Member of [employer] or Organizations: Yes    Attends Banker Meetings: More than 4 times per year    Marital Status: Divorced  Intimate Partner Violence: Not At Risk (02/23/2021)   Humiliation, Afraid, Rape, and Kick questionnaire    Fear of Current or Ex-Partner: No    Emotionally Abused: No    Physically Abused: No    Sexually Abused: No       Review of Systems  All other systems reviewed and are negative.      Objective:   Physical Exam Vitals reviewed.  Constitutional:      General: She is not in acute distress.    Appearance: She is well-developed. She is not diaphoretic.  HENT:     Head: Normocephalic and atraumatic.     Right Ear: External ear normal.     Left Ear: External ear normal.     Nose: Nose normal.     Mouth/Throat:     Pharynx: No oropharyngeal exudate.  Eyes:     General: No scleral icterus.       Right eye: No discharge.        Left eye: No discharge.     Conjunctiva/sclera: Conjunctivae normal.     Pupils: Pupils are equal, round, and reactive to light.  Neck:     Thyroid : No thyromegaly.     Vascular: No JVD.     Trachea: No tracheal deviation.  Cardiovascular:     Rate and Rhythm: Normal rate and regular rhythm.     Heart sounds: Normal heart sounds. No murmur heard.    No  friction rub. No gallop.  Pulmonary:     Effort: Pulmonary effort is normal. No respiratory distress.     Breath sounds: Normal breath sounds. No stridor. No wheezing or rales.  Chest:     Chest wall: No tenderness.  Abdominal:     General: Bowel sounds are normal. There is no distension.     Palpations: Abdomen is soft. There is no mass.     Tenderness: There is no abdominal tenderness. There is no guarding or rebound.  Musculoskeletal:        General: No tenderness or deformity.     Cervical back: Normal range of motion and neck supple.  Lymphadenopathy:     Cervical: No cervical adenopathy.  Skin:    General: Skin is warm.     Coloration: Skin is not pale.     Findings: No erythema or rash.  Neurological:     Mental Status: She is alert and oriented to person, place, and time.     Cranial Nerves:  No cranial nerve deficit.     Motor: No abnormal muscle tone.     Coordination: Coordination normal.     Deep Tendon Reflexes: Reflexes are normal and symmetric. Reflexes normal.  Psychiatric:        Behavior: Behavior normal.        Thought Content: Thought content normal.        Judgment: Judgment normal.           Assessment & Plan:  Other specified hypothyroidism  General medical exam  Fatigue, unspecified type  Benign essential HTN Given her blood pressure today, I am concerned that some of her fatigue may be related to hypotension.  I recommended that she check her blood pressure at home more frequently.  If consistently more than 120/80, consider reducing the dose of losartan .  The remainder of her lab work is outstanding.  We discussed decreasing her atorvastatin  but she would like to stay on the current dose.  Her LDL cholesterol is excellent.  Her immunizations are up-to-date except for a flu shot and a COVID shot in the fall.  Cancer screening is up-to-date except for her mammogram which she gets through her gynecologist.  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  She has an appointment to see her orthopedist later this month to review the results of the MRI.  The official read is not back yet

## 2024-03-25 NOTE — Progress Notes (Signed)
 Dear Helen, your lab indicated good cholesterol level. Please continue your cholesterol medication and heart healthy diet.    Last read by Helen [name] at 11:48AM on 03/25/2024.  Lipid Panel; C-Peptide; Glucose, Fasting

## 2024-03-27 ENCOUNTER — Encounter: Payer: PRIVATE HEALTH INSURANCE | Admitting: Family Medicine

## 2024-03-27 ENCOUNTER — Ambulatory Visit: Admitting: Student in an Organized Health Care Education/Training Program

## 2024-03-29 ENCOUNTER — Encounter: Payer: Self-pay | Admitting: Family Medicine

## 2024-03-31 ENCOUNTER — Other Ambulatory Visit: Payer: Self-pay | Admitting: Family Medicine

## 2024-03-31 DIAGNOSIS — L608 Other nail disorders: Secondary | ICD-10-CM

## 2024-04-02 ENCOUNTER — Encounter: Payer: Self-pay | Admitting: Family Medicine

## 2024-04-03 ENCOUNTER — Ambulatory Visit (INDEPENDENT_AMBULATORY_CARE_PROVIDER_SITE_OTHER): Payer: PRIVATE HEALTH INSURANCE | Admitting: Physical Medicine and Rehabilitation

## 2024-04-03 ENCOUNTER — Encounter: Payer: Self-pay | Admitting: Physical Medicine and Rehabilitation

## 2024-04-03 DIAGNOSIS — M45A2 Non-radiographic axial spondyloarthritis of cervical region: Secondary | ICD-10-CM | POA: Diagnosis not present

## 2024-04-03 DIAGNOSIS — M47812 Spondylosis without myelopathy or radiculopathy, cervical region: Secondary | ICD-10-CM | POA: Diagnosis not present

## 2024-04-03 DIAGNOSIS — M4802 Spinal stenosis, cervical region: Secondary | ICD-10-CM

## 2024-04-03 DIAGNOSIS — M7918 Myalgia, other site: Secondary | ICD-10-CM

## 2024-04-03 DIAGNOSIS — M5412 Radiculopathy, cervical region: Secondary | ICD-10-CM

## 2024-04-03 NOTE — Progress Notes (Signed)
 April Beck - 78 y.o. female MRN [id number]  Date of birth: [birth year]  Office Visit Note: Visit Date: 04/03/2024 PCP: Duanne Butler DASEN, MD Referred by: Duanne Butler DASEN, MD  Subjective: Chief Complaint  Patient presents with   Neck - Pain   HPI: April Beck is a 78 y.o. female who comes in today for evaluation of chronic, worsening and severe  bilateral neck pain radiating up to right side of head, intermittent pain radiating to shoulders and down both arms to elbows. She is here today for cervical MRI review. During our last office visit in June she reported intermittent headaches, these headaches have improved. Side to side rotation of her neck causes severe pain. She describes her pain deep aching and stabbing sensation, currently rates as 7 out of 10. Some relief of pain with home exercise regimen, rest and use of medications. History dedicated formal physical therapy for her neck in 2024, no relief of pain. Recent cervical MRI imaging shows acute exacerbation of right C1-C2 arthritis. There is patchy acute marrow edema noted. There is also multi level degenerative changes and foraminal stenosis. Patient denies focal weakness, numbness and tingling. No recent trauma or falls.      Review of Systems  Musculoskeletal:  Positive for myalgias and neck pain.  Neurological:  Negative for tingling, sensory change, focal weakness and weakness.  All other systems reviewed and are negative.  Otherwise per HPI.  Assessment & Plan: Visit Diagnoses:    ICD-10-CM   1. Radiculopathy, cervical region  M54.12 CBC with Differential/Platelet    Sedimentation rate    C-reactive protein    HLA-B27 antigen    Rheumatoid factor    ANA    Ambulatory referral to Physical Medicine Rehab    2. Foraminal stenosis of cervical region  M48.02 CBC with Differential/Platelet    Sedimentation rate    C-reactive protein    HLA-B27 antigen    Rheumatoid factor    ANA    Ambulatory referral to  Physical Medicine Rehab    3. Facet arthropathy, cervical  M47.812 CBC with Differential/Platelet    Sedimentation rate    C-reactive protein    HLA-B27 antigen    Rheumatoid factor    ANA    Ambulatory referral to Physical Medicine Rehab    4. Myofascial pain syndrome  M79.18 CBC with Differential/Platelet    Sedimentation rate    C-reactive protein    HLA-B27 antigen    Rheumatoid factor    ANA    Ambulatory referral to Physical Medicine Rehab    5. 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 Patients clinical presentation and exam are complex. Differentials include cervical radiculopathy and cervical facet mediated pain. I reviewed recent cervical MRI with her today using imaging and spine model. She has both multi level foraminal stenosis and fairly severe arthritis at the C1-C2 level. Next step is to perform diagnostic and hopefully therapeutic right C7-T1 interlaminar epidural steroid injection under fluoroscopic guidance. She is not currently taking anticoagulant medications. I discussed injection procedure with her in detail today, she has no questions at this time. If good relief of pain with injection we can repeat this procedure infrequently as needed. I also  placed order for rheumatological panel to rule out any inflammatory causes of her pain. If warranted will refer to Rheumatology for further work up. No red flag symptoms noted upon exam today.     Meds & Orders: No orders of the defined types were placed in this encounter.   Orders Placed This Encounter  Procedures   CBC with Differential/Platelet   Sedimentation rate   C-reactive protein   HLA-B27 antigen   Rheumatoid factor   ANA    Ambulatory referral to Physical Medicine Rehab    Follow-up: Return for Right C7-T1 interlaminar epidural steroid injection.   Procedures: No procedures performed      Clinical History: CLINICAL DATA:  This study identified as missing a report at 1038 hours on 04/03/2024.   78 year old female with persistent neck pain.   EXAM: MRI CERVICAL SPINE WITHOUT CONTRAST   TECHNIQUE: Multiplanar, multisequence MR imaging of the cervical spine was performed. No intravenous contrast was administered.   COMPARISON:  Cervical spine CT 10/28/2022.   FINDINGS: Alignment: Chronic straightening of cervical lordosis stable from last year. No significant scoliosis. Subtle chronic anterolisthesis of C3 on C4. Similar mild anterolisthesis of C7 on T1.   Vertebrae: Normal background bone marrow signal. Chronic degenerative endplate osteophytosis and marrow signal changes. Faint degenerative endplate marrow edema asymmetric to the left at C6-C7. Similar patchy degenerative appearing marrow edema at the base of the odontoid (series 107, image 8). And more confluent degenerative appearing marrow edema in the right C1 ring, subjacent C2 articular surface (series 107, images 3 and 4). Severe right side C1-C2 joint space loss and subchondral sclerosis demonstrated by CT last year. Maintained vertebral height.   Cord: Normal.   Posterior Fossa, vertebral arteries, paraspinal tissues: Cervicomedullary junction is within normal limits. Negative visible posterior fossa. Preserved bilateral major vascular flow voids in the neck.   Negative visible neck soft tissues and lung apices.   Disc levels:   C1-C2: Severe asymmetric right side joint space loss and degeneration as seen on CT last year (series 6, image 24 of that exam).   C2-C3: Mild disc bulging. Mild to moderate left facet and ligament flavum hypertrophy. Mild to moderate left C3 foraminal stenosis.   C3-C4: Disc space loss. Chronic  anterolisthesis. Circumferential disc bulge with endplate spurring. Moderate facet hypertrophy greater on the left. Effaced ventral CSF space but no significant spinal stenosis (series 106, image 8). Moderate left greater than right C4 foraminal stenosis.   C4-C5: Disc space loss. Circumferential disc osteophyte complex with a broad-based posterior component. Mild posterior element hypertrophy. Mild spinal stenosis. No cord mass effect. Moderate left greater than right C5 foraminal stenosis.   C5-C6: Disc space loss. Circumferential disc osteophyte complex. Mild to moderate facet and ligament flavum hypertrophy. Mild spinal stenosis. No cord mass effect. Moderate to severe right, moderate left C6 foraminal stenosis.   C6-C7: Disc space loss. Circumferential disc osteophyte complex. Mild to moderate posterior element hypertrophy. No significant spinal stenosis. Moderate to severe left, moderate right C7 foraminal stenosis.   C7-T1: Chronic mild anterolisthesis. Mild disc bulging. Moderate to severe facet hypertrophy greater on the left. Moderate ligament flavum hypertrophy. No spinal stenosis. Moderate to severe left C8 neural foraminal stenosis.   Visible upper thoracic spine disc degeneration but fairly capacious visible upper thoracic spinal canal, no visible upper thoracic spinal stenosis (series 106, image 9).   IMPRESSION: 1. 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 Severe chronic joint space loss demonstrated there on CT last year.  Patchy acute marrow edema there and at the base of the odontoid on this exam. 2. 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 Mild degenerative anterolisthesis at both C3-C4 and C7-T1 associated with advanced facet degeneration. 3. Mild multifactorial spinal stenosis at C4-C5 and C5-C6. No spinal cord mass effect or signal abnormality. 4. Associated moderate, and occasionally severe, neural foraminal stenosis  at the left C3, bilateral C4, C5, C6 (severe on the right), C7 (severe on the left) and left C8 (severe) nerve levels.     Electronically Signed   By: VEAR Hurst M.D.   On: 04/03/2024 11:10   She reports that she has never smoked. She has never used smokeless tobacco. No results for input(s): HGBA1C, LABURIC in the last 8760 hours.  Objective:  VS:  HT:    WT:   BMI:     BP:   HR: bpm  TEMP: ( )  RESP:  Physical Exam Vitals and nursing note reviewed.  HENT:     Head: Normocephalic and atraumatic.     Right Ear: External ear normal.     Left Ear: External ear normal.     Nose: Nose normal.     Mouth/Throat:     Mouth: Mucous membranes are moist.  Eyes:     Extraocular Movements: Extraocular movements intact.  Cardiovascular:     Rate and Rhythm: Normal rate.     Pulses: Normal pulses.  Pulmonary:     Effort: Pulmonary effort is normal.  Abdominal:     General: Abdomen is flat. There is no distension.  Musculoskeletal:        General: Tenderness present.     Cervical back: Tenderness present.     Comments: Discomfort noted with side-to-side rotation. Patient has good strength in the upper extremities including 5 out of 5 strength in wrist extension, long finger flexion and APB. Shoulder range of motion is full bilaterally without any sign of impingement. There is no atrophy of the hands intrinsically. Sensation intact bilaterally. Myofascial tenderness noted to bilateral levator scapulae regions upon palpation. Negative Hoffman's sign. Negative Spurling's sign.     Skin:    General: Skin is warm and dry.     Capillary Refill: Capillary refill takes less than 2 seconds.  Neurological:     General: No focal deficit present.     Mental Status: She is alert and oriented to person, place, and time.  Psychiatric:        Mood and Affect: Mood normal.        Behavior: Behavior normal.     Ortho Exam  Imaging: No results found.  Past Medical/Family/Surgical/Social  History: Medications & Allergies reviewed per EMR, new medications updated. 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 01/16/2022   Ovarian cyst 01/16/2021   Chronic constipation 11/21/2020   CKD (chronic kidney disease) stage 3, GFR 30-59 ml/min (HCC) 11/21/2020   Herpes 11/21/2020   Hypokalemia 11/21/2020   Weakness 11/17/2020   Aspiration into airway    Fall    Dysphagia 06/14/2013   Atrophic vaginitis 06/14/2013   Hypercholesteremia    Hypertension    GERD (gastroesophageal reflux disease)    Hypothyroidism    Renal insufficiency 06/19/2012   Past Medical History:  Diagnosis Date   Anxiety    hx of panic attack   Arthritis  hands & knees   CKD (chronic kidney disease), stage III (HCC)    followed by pcp   Closed fracture of left distal radius 10/31/2021   Edema of both lower extremities    GERD (gastroesophageal reflux disease)    History of 2019 novel coronavirus disease (COVID-19) 09/17/2020   positive result in epic,  per pt very mild symtpoms that resolved   History of uterine fibroid    Hypertension    followed by pcp   Hypomagnesemia    Hypothyroidism    followed by pcp   IDA (iron deficiency anemia)    Osteopenia    Ovarian cyst    Pelvic pain    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, takes meloxicam    Wears contact lenses    Family History  Problem Relation Age of Onset   Colon polyps Brother    Breast cancer Maternal Grandmother    Diabetes Brother    Hypertension Brother    Colon cancer Neg Hx    Esophageal cancer Neg Hx    Rectal cancer Neg Hx    Stomach cancer Neg Hx    Past Surgical History:  Procedure Laterality Date   ABDOMINAL HYSTERECTOMY  1982   CATARACT EXTRACTION Bilateral    CHOLECYSTECTOMY  06/20/2012   Procedure: LAPAROSCOPIC  CHOLECYSTECTOMY;  Surgeon: Dann FORBES Hummer, MD;  Location: [facility] OR;  Service: General;  Laterality: N/A;   COLONOSCOPY  last one 03-20-2019  dr teressa   DIAGNOSTIC LAPAROSCOPY  yrs ago   HIP ARTHROPLASTY Right 11/13/2020   Procedure: ARTHROPLASTY BIPOLAR HIP (HEMIARTHROPLASTY);  Surgeon: Margrette Taft FORBES, MD;  Location: [facility];  Service: Orthopedics;  Laterality: Right;   INCONTINENCE SURGERY  09-11-2005   dr watt  @[facility]    LYNX SLING   LAPAROSCOPIC SALPINGO OOPHERECTOMY Bilateral 01/16/2021   Procedure: DIAGNOSTIC LAPAROSCOPY;  Surgeon: Johnnye Ade, MD;  Location[facility];  Service: Gynecology;  Laterality: Bilateral;   LAPAROSCOPY Bilateral 01/16/2021   Procedure: EXPLORATORY LAPAROTOMY BILATERAL SALPINGO OOPHORECTOMY;  Surgeon: Johnnye Ade, MD;  Location[facility];  Service: Gynecology;  Laterality: Bilateral;   TUBAL LIGATION     Social History   Occupational History   Not on file  Tobacco Use   Smoking status: Never   Smokeless tobacco: Never  Vaping Use   Vaping status: Never Used  Substance and Sexual Activity   Alcohol use: No   Drug use: Never   Sexual activity: Not Currently    Birth control/protection: Surgical

## 2024-04-03 NOTE — Progress Notes (Signed)
 Pain Scale   Average Pain 7 Patient advising that her pain in her neck radiates to her shoulders(both) patient also advised that pain at times radiates to her head.  Patient her for MRI review        +Driver, -BT, -Dye Allergies.

## 2024-04-06 ENCOUNTER — Ambulatory Visit (INDEPENDENT_AMBULATORY_CARE_PROVIDER_SITE_OTHER): Payer: Self-pay

## 2024-04-06 DIAGNOSIS — I10 Essential (primary) hypertension: Secondary | ICD-10-CM

## 2024-04-06 DIAGNOSIS — E119 Type 2 diabetes mellitus without complications: Secondary | ICD-10-CM

## 2024-04-06 LAB — CBC WITH DIFFERENTIAL/PLATELET
Absolute Lymphocytes: 3128 {cells}/uL (ref 850–3900)
Absolute Monocytes: 735 {cells}/uL (ref 200–950)
Basophils Absolute: 23 {cells}/uL (ref 0–200)
Basophils Relative: 0.3 %
Eosinophils Absolute: 113 {cells}/uL (ref 15–500)
Eosinophils Relative: 1.5 %
HCT: 43 % (ref 35.0–45.0)
Hemoglobin: 13.7 g/dL (ref 11.7–15.5)
MCH: 31 pg (ref 27.0–33.0)
MCHC: 31.9 g/dL — ABNORMAL LOW (ref 32.0–36.0)
MCV: 97.3 fL (ref 80.0–100.0)
MPV: 9.4 fL (ref 7.5–12.5)
Monocytes Relative: 9.8 %
Neutro Abs: 3503 {cells}/uL (ref 1500–7800)
Neutrophils Relative %: 46.7 %
Platelets: 251 Thousand/uL (ref 140–400)
RBC: 4.42 Million/uL (ref 3.80–5.10)
RDW: 11.6 % (ref 11.0–15.0)
Total Lymphocyte: 41.7 %
WBC: 7.5 Thousand/uL (ref 3.8–10.8)

## 2024-04-06 LAB — C-REACTIVE PROTEIN: CRP: <3 mg/L (ref <8.0)

## 2024-04-06 LAB — ANTI-NUCLEAR AB-TITER (ANA TITER)
ANA TITER: 1:40 {titer} — ABNORMAL HIGH
ANA Titer 1: 1:320 {titer} — ABNORMAL HIGH

## 2024-04-06 LAB — SEDIMENTATION RATE: Sed Rate: 2 mm/h (ref 0–30)

## 2024-04-06 LAB — HLA-B27 ANTIGEN: HLA-B27 Antigen: NEGATIVE

## 2024-04-06 LAB — RHEUMATOID FACTOR: Rheumatoid fact SerPl-aCnc: <10 [IU]/mL (ref <14)

## 2024-04-06 LAB — ANA: Anti Nuclear Antibody (ANA): POSITIVE — AB

## 2024-04-07 ENCOUNTER — Other Ambulatory Visit (INDEPENDENT_AMBULATORY_CARE_PROVIDER_SITE_OTHER): Payer: Self-pay | Admitting: Family

## 2024-04-07 ENCOUNTER — Ambulatory Visit (INDEPENDENT_AMBULATORY_CARE_PROVIDER_SITE_OTHER): Admitting: Nurse Practitioner

## 2024-04-07 DIAGNOSIS — J302 Other seasonal allergic rhinitis: Secondary | ICD-10-CM

## 2024-04-07 DIAGNOSIS — E7800 Pure hypercholesterolemia, unspecified: Secondary | ICD-10-CM

## 2024-04-09 ENCOUNTER — Ambulatory Visit (INDEPENDENT_AMBULATORY_CARE_PROVIDER_SITE_OTHER): Admitting: Podiatry

## 2024-04-09 ENCOUNTER — Telehealth: Payer: Self-pay | Admitting: Podiatry

## 2024-04-09 ENCOUNTER — Other Ambulatory Visit: Payer: Self-pay | Admitting: Podiatry

## 2024-04-09 ENCOUNTER — Encounter: Payer: Self-pay | Admitting: Family Medicine

## 2024-04-09 ENCOUNTER — Other Ambulatory Visit: Payer: Self-pay | Admitting: Physical Medicine and Rehabilitation

## 2024-04-09 ENCOUNTER — Ambulatory Visit: Admitting: Physical Medicine and Rehabilitation

## 2024-04-09 VITALS — Ht 66.0 in | Wt 164.4 lb

## 2024-04-09 DIAGNOSIS — M2042 Other hammer toe(s) (acquired), left foot: Secondary | ICD-10-CM | POA: Diagnosis not present

## 2024-04-09 DIAGNOSIS — L84 Corns and callosities: Secondary | ICD-10-CM | POA: Diagnosis not present

## 2024-04-09 DIAGNOSIS — M79675 Pain in left toe(s): Secondary | ICD-10-CM

## 2024-04-09 DIAGNOSIS — M328 Other forms of systemic lupus erythematosus: Secondary | ICD-10-CM

## 2024-04-09 DIAGNOSIS — M3219 Other organ or system involvement in systemic lupus erythematosus: Secondary | ICD-10-CM

## 2024-04-09 DIAGNOSIS — B351 Tinea unguium: Secondary | ICD-10-CM

## 2024-04-09 DIAGNOSIS — M79674 Pain in right toe(s): Secondary | ICD-10-CM

## 2024-04-09 MED ORDER — CICLOPIROX 8 % EX SOLN: Freq: Every day | CUTANEOUS | 2 refills | Status: AC

## 2024-04-09 MED ORDER — TAVABOROLE 5 % EX SOLN: 1.0000 [drp] | Freq: Every day | CUTANEOUS | 2 refills | Status: DC

## 2024-04-09 NOTE — Telephone Encounter (Signed)
 Spoke to patient would like another prescription called in the one you sent was to expensive.

## 2024-04-09 NOTE — Patient Instructions (Signed)
 Tavaborole  Topical solution What is this medication? TAVABOROLE  (ta va BO role) treats fungal infections of the nails. It belongs to a group of medications called antifungals. It will not treat infections caused by bacteria or viruses. This medicine may be used for other purposes; ask your health care provider or pharmacist if you have questions. COMMON BRAND NAME(S): KERYDIN  What should I tell my care team before I take this medication? They need to know if you have any of these conditions: An unusual or allergic reaction to tavaborole , other medications, foods, dyes, or preservatives Pregnant or trying to get pregnant Breast-feeding How should I use this medication? This medication is for external use only. Do not take by mouth. Wash your hands before and after use. If you are treating your hands, only wash your hands before use. Do not get it in your eyes. If you do, rinse your eyes with plenty of cool tap water. Use it as directed on the prescription label. Do not use it more often than directed. Use the medication for the full course as directed by your care team, even if you think you are better. Do not stop using it unless your care team tells you to stop it early. Apply a thin film of the medication to the affected area. Talk to your care team about the use of this medication in children. While it may be prescribed for children as young as 6 years for selected conditions, precautions do apply. Overdosage: If you think you have taken too much of this medicine contact a poison control center or emergency room at once. NOTE: This medicine is only for you. Do not share this medicine with others. What if I miss a dose? If you miss a dose, use it as soon as you can. If it is almost time for your next dose, use only that dose. Do not use double or extra doses. What may interact with this medication? Interactions have not been studied. Do not use any other nail products (i.e., nail polish,  pedicures) during treatment with this medication. This list may not describe all possible interactions. Give your health care provider a list of all the medicines, herbs, non-prescription drugs, or dietary supplements you use. Also tell them if you smoke, drink alcohol, or use illegal drugs. Some items may interact with your medicine. What should I watch for while using this medication? Visit your care team for regular checks on your progress. It may be some time before you see the benefit from this medication. After bathing, make sure your skin is very dry. Fungal infections like moist conditions. Do not walk around barefoot. To help prevent reinfection, wear freshly washed cotton, not synthetic, clothing. Tell your care team if you develop sores or blisters that do not heal properly. If your skin infection returns after you stop using this medication, contact your care team. What side effects may I notice from receiving this medication? Side effects that you should report to your care team as soon as possible: Allergic reactions--skin rash, itching, hives, swelling of the face, lips, tongue, or throat Burning, itching, crusting, or peeling of treated skin Side effects that usually do not require medical attention (report to your care team if they continue or are bothersome): Ingrown nails Mild skin irritation, redness, or dryness This list may not describe all possible side effects. Call your doctor for medical advice about side effects. You may report side effects to FDA at 1-800-FDA-1088. Where should I keep my medication? Keep out  of the reach of children and pets. Store at room temperature between 20 and 25 degrees C (68 and 77 degrees F). Keep this medication in the original container. Protect from moisture. Keep the container tightly closed. Avoid exposure to extreme heat. Get rid of any unused medication 3 months after opening. This medication is flammable. Avoid exposure to heat, fire,  flame, and smoking. To get rid of medications that are no longer needed or have expired: Take the medications to a medication take-back program. Check with your pharmacy or law enforcement to find a location. If you cannot return the medication, check the label or package insert to see if the medication should be thrown out in the garbage or flushed down the toilet. If you are not sure, ask your care team. If it is safe to put it in the trash, empty the medication out of the container. Mix the medication with cat litter, dirt, coffee grounds, or other unwanted substance. Seal the mixture in a bag or container. Put it in the trash. NOTE: This sheet is a summary. It may not cover all possible information. If you have questions about this medicine, talk to your doctor, pharmacist, or health care provider.  2024 Elsevier/Gold Standard (2021-12-07 00:00:00)

## 2024-04-09 NOTE — Telephone Encounter (Signed)
 Tavaborole  (KERYDIN ) 5 % SOLN  The co-pay for the following medication is $100.00. She would like to know if you can provide a more affordable alternative. Thank you.

## 2024-04-09 NOTE — Telephone Encounter (Signed)
 Patient called and wanted to know if she would still be able to wear nail polish while on the medication that was prescribed for toenail fungus.

## 2024-04-10 ENCOUNTER — Encounter (INDEPENDENT_AMBULATORY_CARE_PROVIDER_SITE_OTHER): Payer: Self-pay | Admitting: Family Medicine

## 2024-04-10 ENCOUNTER — Ambulatory Visit (INDEPENDENT_AMBULATORY_CARE_PROVIDER_SITE_OTHER): Admitting: Family Medicine

## 2024-04-10 ENCOUNTER — Ambulatory Visit (INDEPENDENT_AMBULATORY_CARE_PROVIDER_SITE_OTHER)

## 2024-04-10 VITALS — BP 136/75 | HR 83 | Ht 60.5 in

## 2024-04-10 DIAGNOSIS — M170 Bilateral primary osteoarthritis of knee: Secondary | ICD-10-CM

## 2024-04-10 DIAGNOSIS — M1711 Unilateral primary osteoarthritis, right knee: Secondary | ICD-10-CM

## 2024-04-10 DIAGNOSIS — M25562 Pain in left knee: Secondary | ICD-10-CM

## 2024-04-10 DIAGNOSIS — M545 Low back pain, unspecified: Secondary | ICD-10-CM

## 2024-04-10 DIAGNOSIS — M1712 Unilateral primary osteoarthritis, left knee: Secondary | ICD-10-CM

## 2024-04-10 LAB — ANTI-SMITH ANTIBODY: ENA SM Ab Ser-aCnc: <1 AI

## 2024-04-10 LAB — ANTI-DNA ANTIBODY, DOUBLE-STRANDED: ds DNA Ab: <1 [IU]/mL

## 2024-04-10 MED ORDER — LIDOCAINE 5 % EX PTCH: 1.0000 | MEDICATED_PATCH | Freq: Every day | CUTANEOUS | 1 refills | Status: AC | PRN

## 2024-04-10 MED ORDER — TRIAMCINOLONE ACETONIDE 40 MG/ML IJ SUSP
40.0000 mg | Freq: Once | INTRAMUSCULAR | Status: AC
Start: 2024-04-10 — End: 2024-04-10
  Administered 2024-04-10: 40 mg via INTRA_ARTICULAR

## 2024-04-10 NOTE — Telephone Encounter (Signed)
Left VM relaying the message

## 2024-04-10 NOTE — Telephone Encounter (Signed)
 No not that I am aware this message was still on my end sorry.

## 2024-04-10 NOTE — Progress Notes (Signed)
 Date of Exam: 04/10/2024 11:24 AM      Patient ID: Helen [name] is a 78 y.o. female.  Attending Physician: Toribio KATHEE Berkley PONCE, DO      Chief Complaint:   Chief Complaint   Patient presents with    Knee Problem     Bilat knee f/u            HPI:   History of Present Illness  Helen [name] is a 78 year old female with secondary osteoarthritis who presents with bilateral knee pain.    She experiences sharp pain in both knees, predominantly in the left, with walking and pressure on her foot. Swelling is present in the knees. She uses pain patches and Voltaren  gel, with variable relief, and wears a knee brace.        ROS:   As per HPI.  Otherwise as below.  Review of Systems        Problem List:   Problem List[1]     Current Meds:   Medications Taking[2]      Allergies:   Allergies[3]     Past Surgical History:   Past Surgical History[4]       Family History:   Family History[5]       Social History:   Social History[6]       The following sections were reviewed this encounter by the provider:   Tobacco  Allergies  Meds  Problems  Med Hx  Surg Hx  Fam Hx              Vital Signs:   BP 136/75 (BP Site: Right arm, Patient Position: Sitting)   Pulse 83   Ht 1.537 m (5' 0.5)   SpO2 100%   BMI 29.81 kg/m           Physical Exam:   Physical Exam       Physical Exam  MUSCULOSKELETAL: full AROM of the knees.  No effusion.  L medial joint line tenderness        Procedure(s):   Arthrocentesis    Date/Time: 04/10/2024 11:24 AM    Performed by: Berkley Toribio KATHEE PONCE, DO  Authorized by: Berkley Toribio KATHEE PONCE, DO  Consent: Verbal consent obtained  Risks and benefits: risks, benefits and alternatives were discussed  Consent given by: patient  Patient understanding: patient states understanding of the procedure being performed  Patient consent: the patient's understanding of the procedure matches consent given  Site marked: the operative site was marked  Patient identity confirmed: verbally with patient  Indications:  pain   Body area: knee  Joint: left knee  Preparation: Patient was prepped and draped in the usual sterile fashion.  Needle size: 22 G  Approach: lateral  Triamcinolone  amount: 40 mg  Lidocaine  1% amount: 3 mL  Patient tolerance: patient tolerated the procedure well with no immediate complications              Radiology:   Results  RADIOLOGY  Knee X-ray: Severe osteoarthritis in both knees, unchanged from previous imaging (04/10/2024)          Assessment:   1. Primary osteoarthritis of both knees  - XR Knee 4+ Views Right  - triamcinolone  acetonide (KENALOG -40) 40 MG/ML injection 40 mg    2. Chronic pain of left knee  - triamcinolone  acetonide (KENALOG -40) 40 MG/ML injection 40 mg    3. Chronic right-sided low back pain without sciatica  - lidocaine  (Lidoderm ) 5 %; Place 1  patch onto the skin once daily as needed (pain) Remove & Discard patch within 12 hours  Dispense: 30 patch; Refill: 1           Plan:   1. Primary osteoarthritis of both knees    2. Chronic pain of left knee    3. Chronic right-sided low back pain without sciatica         Assessment & Plan  Bilateral knee osteoarthritis with associated pain  Severe osteoarthritis in both knees with sharp pain and swelling, predominantly in the left knee. X-rays unchanged. Insurance denied hyaluronic acid injections.  - Administered cortisone injection to the left knee. Relief expected in three to four days. Consider repeat in three months if pain returns.  - Prescribed pain management patches. Sent prescription to [facility] on [address].  - Advised continuation of Voltaren  gel as needed.  - Instructed to limit activity for the next couple of days post-injection.          Follow-up:   No follow-ups on file.          Toribio KATHEE Lei II, DO              [1]   Patient Active Problem List  Diagnosis    Hypertension    Hyperlipidemia    H/O total hysterectomy with removal of both tubes and ovaries    Heart block AV first degree    RBBB (right bundle branch block)     Palpitations    Pure hypercholesterolemia, unspecified    Nonspecific abnormal electrocardiogram (ECG) (EKG)    Type 2 diabetes mellitus without complication, without long-term current use of insulin  (CMS/HCC)    Type 2 diabetes mellitus with hyperglycemia (CMS/HCC)    Anemia    History of colonic polyps    Gastroesophageal reflux disease, unspecified whether esophagitis present    Osteopenia after menopause    Stage 2 chronic kidney disease   [2]   Outpatient Medications Marked as Taking for the 04/10/24 encounter (Office Visit) with Lei Toribio KATHEE PONCE, DO   Medication Sig Dispense Refill    albuterol  (PROVENTIL ) (2.5 MG/3ML) 0.083% nebulizer solution TAKE 3 MLS (2.5 MG TOTAL) BY NEBULIZATION EVERY 4 (FOUR) HOURS AS NEEDED FOR SHORTNESS OF BREATH OR WHEEZING 75 mL 1    albuterol  sulfate HFA (PROVENTIL ) 108 (90 Base) MCG/ACT inhaler INHALE 2 PUFFS INTO THE LUNGS EVERY 4 HOURS AS NEEDED FOR WHEEZING OR SHORTNESS OF BREATH. 6.7 each 2    amLODIPine  (NORVASC ) 10 MG tablet TAKE 1 TABLET (10 MG) BY MOUTH DAILY. 90 tablet 0    atorvastatin  (LIPITOR) 40 MG tablet TAKE 1 TABLET BY MOUTH EVERY DAY 90 tablet 0    azelastine  (ASTELIN ) 0.1 % nasal spray 2 sprays by Nasal route 2 (two) times daily Use in each nostril as directed 30 mL 1    Blood Glucose Monitoring Suppl (ONE TOUCH ULTRA 2) W/DEVICE KIT One daily 1 each 0    cetirizine (ZyrTEC) 10 MG tablet Take 1 tablet (10 mg) by mouth once daily      Cholecalciferol (Vitamin D3) 2000 UNIT capsule TAKE 1 CAPSULE BY MOUTH EVERY DAY 90 capsule 1    dexAMETHasone (DECADRON) 0.1 % ophthalmic solution Place 1 drop into both eyes      EPINEPHrine  (EPIPEN  2-PAK) 0.3 MG/0.3ML Solution Auto-injector injection Inject 0.3 mLs (0.3 mg) into the muscle once as needed (Anaphylaxis) 1 each 3    EPINEPHrine  0.3 MG/0.3ML auto-injector 1 DOSE IF NEEDED FOR ANAPHYLAXIS (Patient  taking differently: once)      ferrous sulfate 325 (65 FE) MG tablet Take 1 tablet (325 mg) by mouth every morning with  breakfast      fluticasone  (FLONASE ) 50 MCG/ACT nasal spray 2 sprays by Nasal route daily      fluticasone  (FLOVENT  HFA) 220 MCG/ACT inhaler INHALE 1 PUFF INTO THE LUNGS TWICE A DAY 24 each 2    Glucagon , rDNA, (Glucagon  Emergency) 1 MG Kit Inject 1 mg into the skin once as needed (severe hypoglycemia) 1 kit 1    Jardiance  25 MG tablet TAKE 1 TABLET BY MOUTH EVERY MORNING 90 tablet 1    Lancets (onetouch ultrasoft) lancets Use to check blood sugars 2 times daily 200 each 3    linaGLIPtin  (Tradjenta ) 5 MG Tablet Take 1 tablet (5 mg) by mouth once daily      metFORMIN  (GLUCOPHAGE -XR) 500 MG 24 hr tablet TAKE 2 TABLETS (1,000 MG) BY MOUTH TWICE A DAY 360 tablet 1    montelukast (SINGULAIR) 10 MG tablet Take 1 tablet (10 mg) by mouth daily      NASONEX 50 MCG/ACT nasal spray 2 sprays by Nasal route daily as needed  5    OneTouch Ultra test strip TWICE A DAY 200 strip 3    pantoprazole  (PROTONIX ) 40 MG tablet TAKE 1 TABLET BY MOUTH EVERY DAY 90 tablet 1    valsartan -hydroCHLOROthiazide  (DIOVAN -HCT) 160-12.5 MG per tablet TAKE 1 TABLET BY MOUTH EVERY DAY 90 tablet 0    [DISCONTINUED] lidocaine  (Lidoderm ) 5 % Place 1 patch onto the skin daily as needed (pain) Remove & Discard patch within 12 hours 30 patch 0     Current Facility-Administered Medications for the 04/10/24 encounter (Office Visit) with Berkley Toribio KATHEE PONCE, DO   Medication Dose Route Frequency Provider Last Rate Last Admin    [COMPLETED] triamcinolone  acetonide (KENALOG -40) 40 MG/ML injection 40 mg  40 mg Intra-articular Once Cloy Cozzens B II, DO   40 mg at 04/10/24 1028   [3]   Allergies  Allergen Reactions    Aspirin      Break out in sweat and dizziness     Other Environmental     Peppers     Pollen Extract     Latex Itching and Rash   [4]   Past Surgical History:  Procedure Laterality Date    ABLATION OF DYSRHYTHMIC FOCUS      ARTHROSCOPY, KNEE Left 04/21/2015    Procedure: ARTHROSCOPY, KNEE PARTIAL MEDIAL AND LATERAL MENISECTOMY LEFT KNEE;  Surgeon:  Evie Shlomo CROME, MD;  Location: [facility];  Service: Orthopedics;  Laterality: Left;  partial medial menisectomy    BREAST BIOPSY      COLONOSCOPY, SCREENING N/A 12/07/2022    Procedure: COLONOSCOPY, SCREENING;  Surgeon: Vernia Mabel HERO, MD;  Location: [facility];  Service: Gastroenterology;  Laterality: N/A;    EGD N/A 12/07/2022    Procedure: EGD;  Surgeon: Vernia Mabel HERO, MD;  Location: [facility];  Service: Gastroenterology;  Laterality: N/A;    HAND SURGERY      HYSTERECTOMY      total    KNEE SURGERY      TUMOR REMOVAL Left     neck  benign    [5]   Family History  Problem Relation Name Age of Onset    Diabetes Mother Elienai Gailey     Diabetes Sister Lincey Clyburn     Diabetes Sister Reena Oris     Malignant hyperthermia Neg Hx  Pseudochol deficiency Neg Hx      Anesthesia problems Neg Hx     [6]   Social History  Tobacco Use    Smoking status: Never     Passive exposure: Never    Smokeless tobacco: Never   Vaping Use    Vaping status: Never Used   Substance Use Topics    Alcohol use: No    Drug use: Never

## 2024-04-13 ENCOUNTER — Other Ambulatory Visit: Payer: Self-pay | Admitting: Physical Medicine and Rehabilitation

## 2024-04-13 ENCOUNTER — Other Ambulatory Visit: Payer: Self-pay | Admitting: Family Medicine

## 2024-04-13 ENCOUNTER — Ambulatory Visit: Payer: Self-pay | Admitting: Physical Medicine and Rehabilitation

## 2024-04-13 ENCOUNTER — Telehealth: Payer: Self-pay | Admitting: Physical Medicine and Rehabilitation

## 2024-04-13 DIAGNOSIS — M47812 Spondylosis without myelopathy or radiculopathy, cervical region: Secondary | ICD-10-CM

## 2024-04-13 DIAGNOSIS — M3219 Other organ or system involvement in systemic lupus erythematosus: Secondary | ICD-10-CM

## 2024-04-13 NOTE — Progress Notes (Signed)
 Subjective:  Patient ID: April Beck, female    DOB: Nov 03, 1945,  MRN: [id number]  Chief Complaint  Patient presents with   Nail Problem    Pt is here due to bilateral toenails, she states when she goes to nail salon and gets a pedicure her toenails a very tender and has hard area's on the bottom of her feet.    Discussed the use of AI scribe software for clinical note transcription with the patient, who gave verbal consent to proceed.  History of Present Illness Sharyl Panchal Krisa Blattner is a 78 year old female who presents with toenail issues and sensitivity.  She experiences sensitivity in her toenails, especially during pedicures, and has a recurring issue with her right toenail, which she has lost two or three times. A dark spot is present on the right toenail, and she has not received recent treatment for these issues.  Hard skin growth occurs around her toenails after pedicures, which she attributes to the way her toenails are cut. A corn is present on her toe, which she believes is due to pressure from her gait.  She recalls an incident prior to her retirement in 2014 where her toenail was partially detached and required removal at an emergency facility. Dried blood is noted under the left toenail, possibly related to nail cutting or shoe rubbing.      Objective:    Physical Exam General: AAO x3, NAD  Dermatological: Toenails are hypertrophic, dystrophic with yellow, brown discoloration.  There is subungual hematoma present on the left, nail.  She states tenderness nails 1-5 on the left and 2 through 5 on the right.  There is no extension of hyperpigmentation to the surrounding skin.  Hyperkeratotic lesions at the distal aspect of the left and right third digits.  No underlying ulceration noted.  No open lesions.  Vascular: Dorsalis Pedis artery and Posterior Tibial artery pedal pulses are 2/4 bilateral with immedate capillary fill time.  Veins are noted in the foot.  There  is no pain with calf compression, swelling, warmth, erythema.   Neruologic: Grossly intact via light touch bilateral.  Musculoskeletal: No other areas of discomfort.  Ambulates present.  Gait: Unassisted, Nonantalgic.     No images are attached to the encounter.    Results    Assessment:   1. Dermatophytosis of nail   2. Hammertoe of left foot   3. Corns and callosities      Plan:  Patient was evaluated and treated and all questions answered.  Assessment and Plan Assessment & Plan Symptomatic onychomycosis -Sharply debrided toenails x 9 without any complications or bleeding. - Prescribe topical antifungal medication, keratin, from [facility] specialty pharmacy. - Instruct to use tea tree oil or coconut oil to hydrate the nail bed. - Advise to contact the office if the topical medication is not covered by insurance or is too expensive.   Corns of toes due to pressure Corns develop due to pressure from toe positioning during ambulation, serving as a natural defense mechanism. - Trim the corn to reduce discomfort. - Provide a toe cap to reduce pressure on the affected area. - Advise wearing shoes that do not rub against the toes.  Hammer toe of left third toe Hammer toe on the left third toe causes curling and pressure-related issues. - Provide a toe cap to reduce pressure on the hammer toe.  Prominent veins of feet with risk of swelling Prominent veins may indicate venous insufficiency with a risk of swelling.  She already practices leg elevation. - Advise continued leg elevation when sitting. - Recommend wearing compression socks to manage venous insufficiency.      Return if symptoms worsen or fail to improve.

## 2024-04-13 NOTE — Telephone Encounter (Signed)
 Pt called to notify PA Duwaine that she has an upcoming appt with Dr Jeannetta Rheumatologist appt is Jan 12,2026. Pt wanted to make sure Duwaine did not need blood work. Pt phone number is [phone]

## 2024-04-15 ENCOUNTER — Ambulatory Visit: Payer: PRIVATE HEALTH INSURANCE

## 2024-04-15 VITALS — Ht 66.0 in | Wt 164.0 lb

## 2024-04-15 DIAGNOSIS — Z0000 Encounter for general adult medical examination without abnormal findings: Secondary | ICD-10-CM | POA: Diagnosis not present

## 2024-04-15 NOTE — Patient Instructions (Signed)
 April Beck , Thank you for taking time out of your busy schedule to complete your Annual Wellness Visit with me. I enjoyed our conversation and look forward to speaking with you again next year. I, as well as your care team,  appreciate your ongoing commitment to your health goals. Please review the following plan we discussed and let me know if I can assist you in the future. Your Game plan/ To Do List      Follow up Visits: We will see or speak with you next year for your Next Medicare AWV with our clinical staff Have you seen your provider in the last 6 months (3 months if uncontrolled diabetes)? Yes  Clinician Recommendations:  Aim for 30 minutes of exercise or brisk walking, 6-8 glasses of water, and 5 servings of fruits and vegetables each day.       This is a list of the screenings recommended for you:  Health Maintenance  Topic Date Due   Flu Shot  04/03/2024   COVID-19 Vaccine (7 - Pfizer risk 2024-25 season) 08/08/2024   Medicare Annual Wellness Visit  04/15/2025   DTaP/Tdap/Td vaccine (2 - Td or Tdap) 06/21/2027   Pneumococcal Vaccine for age over 46  Completed   DEXA scan (bone density measurement)  Completed   Hepatitis C Screening  Completed   Zoster (Shingles) Vaccine  Completed   Hepatitis B Vaccine  Aged Out   HPV Vaccine  Aged Out   Meningitis B Vaccine  Aged Out   Colon Cancer Screening  Discontinued    Advanced directives: (In Chart) A copy of your advanced directives are scanned into your chart should your provider ever need it.  Advance Care Planning is important because it:  [x]  Makes sure you receive the medical care that is consistent with your values, goals, and preferences  [x]  It provides guidance to your family and loved ones and reduces their decisional burden about whether or not they are making the right decisions based on your wishes.  Follow the link provided in your after visit summary or read over the paperwork we have mailed to you to help  you started getting your Advance Directives in place. If you need assistance in completing these, please reach out to us  so that we can help you!  See attachments for Preventive Care and Fall Prevention Tips.

## 2024-04-15 NOTE — Progress Notes (Signed)
 Subjective:   April Beck is a 78 y.o. who presents for a Medicare Wellness preventive visit.  As a reminder, Annual Wellness Visits don't include a physical exam, and some assessments may be limited, especially if this visit is performed virtually. We may recommend an in-person follow-up visit with your provider if needed.  Visit Complete: Virtual I connected with  April Beck on 04/15/24 by a audio enabled telemedicine application and verified that I am speaking with the correct person using two identifiers.  Patient Location: Home  Provider Location: Home Office  I discussed the limitations of evaluation and management by telemedicine. The patient expressed understanding and agreed to proceed.  Vital Signs: Because this visit was a virtual/telehealth visit, some criteria may be missing or patient reported. Any vitals not documented were not able to be obtained and vitals that have been documented are patient reported.  VideoDeclined- This patient declined Librarian, academic. Therefore the visit was completed with audio only.  Persons Participating in Visit: Patient.  AWV Questionnaire: No: Patient Medicare AWV questionnaire was not completed prior to this visit.  Cardiac Risk Factors include: advanced age (>72men, >37 women);dyslipidemia;hypertension     Objective:    Today's Vitals   04/15/24 1132  Weight: 164 lb (74.4 kg)  Height: 5' 6 (1.676 m)   Body mass index is 26.47 kg/m.     04/15/2024   11:56 AM 10/28/2022   10:16 AM 01/15/2022   12:49 PM 02/23/2021    2:15 PM 01/16/2021   10:56 AM 01/16/2021    6:06 AM 11/12/2020    9:33 PM  Advanced Directives  Does Patient Have a Medical Advance Directive? Yes No Yes No Yes Yes No  Type of Estate agent of [place];Living will  Healthcare Power of [employer] Power of [employer] Power of Attorney   Does patient want to make changes to medical  advance directive? No - Patient declined  No - Patient declined  Yes (Inpatient - patient defers changing a medical advance directive at this time - Information given) No - Patient declined   Copy of Healthcare Power of Attorney in Chart? Yes - validated most recent copy scanned in chart (See row information)    No - copy requested    Would patient like information on creating a medical advance directive?    No - Patient declined   No - Patient declined    Current Medications (verified) Outpatient Encounter Medications as of 04/15/2024  Medication Sig   acetaminophen  (TYLENOL ) 325 MG tablet Take 2 tablets (650 mg total) by mouth every 6 (six) hours as needed for pain. (Patient taking differently: Take 650 mg by mouth every 6 (six) hours as needed for pain. Arthritis formula per pt)   acyclovir  (ZOVIRAX ) 400 MG tablet TAKE 1 TABLET EVERY DAY   atorvastatin  (LIPITOR) 40 MG tablet TAKE 1 TABLET EVERY DAY   calcium -vitamin D  (OSCAL WITH D) 250-125 MG-UNIT tablet Take 1 tablet by mouth daily.   ciclopirox  (PENLAC ) 8 % solution Apply topically at bedtime. Apply over nail and surrounding skin. Apply daily over previous coat. After seven (7) days, may remove with alcohol and continue cycle.   citalopram  (CELEXA ) 10 MG tablet TAKE 1 TABLET EVERY DAY   diphenoxylate -atropine  (LOMOTIL ) 2.5-0.025 MG tablet Take 2 tablets by mouth 4 (four) times daily as needed for diarrhea or loose stools.   estradiol  (ESTRACE  VAGINAL) 0.1 MG/GM vaginal cream Place 1 Applicatorful vaginally at bedtime.  fluconazole  (DIFLUCAN ) 150 MG tablet Take 1 tablet (150 mg total) by mouth daily.   fluocinonide gel (LIDEX) 0.05 % Apply 1 application topically 3 (three) times daily as needed (rash).   levothyroxine  (SYNTHROID ) 50 MCG tablet TAKE 1 TABLET EVERY DAY   losartan  (COZAAR ) 50 MG tablet TAKE 1 TABLET EVERY DAY   meclizine  (ANTIVERT ) 25 MG tablet Take 1 tablet (25 mg total) by mouth 3 (three) times daily as needed.    pantoprazole  (PROTONIX ) 40 MG tablet TAKE 1 TABLET TWICE DAILY   Tavaborole  (KERYDIN ) 5 % SOLN Apply 1 drop topically daily. Apply 1 drop to the toenail daily.   No facility-administered encounter medications on file as of 04/15/2024.    Allergies (verified) Patient has no known allergies.   History: Past Medical History:  Diagnosis Date   Anxiety    hx of panic attack   Arthritis    hands & knees   CKD (chronic kidney disease), stage III (HCC)    followed by pcp   Closed fracture of left distal radius 10/31/2021   Edema of both lower extremities    GERD (gastroesophageal reflux disease)    History of 2019 novel coronavirus disease (COVID-19) 09/17/2020   positive result in epic,  per pt very mild symtpoms that resolved   History of uterine fibroid    Hypertension    followed by pcp   Hypomagnesemia    Hypothyroidism    followed by pcp   IDA (iron deficiency anemia)    Osteopenia    Ovarian cyst    Pelvic pain    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, takes meloxicam    Wears contact lenses    Past Surgical History:  Procedure Laterality Date   ABDOMINAL HYSTERECTOMY  1982   CATARACT EXTRACTION Bilateral    CHOLECYSTECTOMY  06/20/2012   Procedure: LAPAROSCOPIC CHOLECYSTECTOMY;  Surgeon: Dann FORBES Hummer, MD;  Location: [facility];  Service: General;  Laterality: N/A;   COLONOSCOPY  last one 03-20-2019  dr teressa   DIAGNOSTIC LAPAROSCOPY  yrs ago   EYE SURGERY  09/27/22   Cararact removal and lens implant   HIP ARTHROPLASTY Right 11/13/2020   Procedure: ARTHROPLASTY BIPOLAR HIP (HEMIARTHROPLASTY);  Surgeon: Margrette Taft FORBES, MD;  Location: [facility];  Service: Orthopedics;  Laterality: Right;   INCONTINENCE SURGERY  09-11-2005   dr watt  @[facility]    LYNX SLING   JOINT REPLACEMENT  11/12/20   LAPAROSCOPIC SALPINGO OOPHERECTOMY Bilateral 01/16/2021   Procedure: DIAGNOSTIC LAPAROSCOPY;  Surgeon: Johnnye Ade, MD;  Location: [facility];   Service: Gynecology;  Laterality: Bilateral;   LAPAROSCOPY Bilateral 01/16/2021   Procedure: EXPLORATORY LAPAROTOMY BILATERAL SALPINGO OOPHORECTOMY;  Surgeon: Johnnye Ade, MD;  Location[facility];  Service: Gynecology;  Laterality: Bilateral;   TUBAL LIGATION     Family History  Problem Relation Age of Onset   Colon polyps Brother    Heart disease Brother    Arthritis Father    Stroke Father    Breast cancer Maternal Grandmother    Diabetes Brother    Hypertension Brother    Colon cancer Neg Hx    Esophageal cancer Neg Hx    Rectal cancer Neg Hx    Stomach cancer Neg Hx    Social History   Socioeconomic History   Marital status: Divorced    Spouse name: Not on file   Number of children: Not on file   Years of education: Not on file  Highest education level: 12th grade  Occupational History   Not on file  Tobacco Use   Smoking status: Never   Smokeless tobacco: Never  Vaping Use   Vaping status: Never Used  Substance and Sexual Activity   Alcohol use: No   Drug use: Never   Sexual activity: Not Currently    Birth control/protection: Surgical  Other Topics Concern   Not on file  Social History Narrative   Not on file   Social Drivers of Health   Financial Resource Strain: Medium Risk (04/15/2024)   Overall Financial Resource Strain (CARDIA)    Difficulty of Paying Living Expenses: Somewhat hard  Food Insecurity: No Food Insecurity (04/15/2024)   Hunger Vital Sign    Worried About Running Out of Food in the Last Year: Never true    Ran Out of Food in the Last Year: Never true  Transportation Needs: No Transportation Needs (04/15/2024)   PRAPARE - Administrator, Civil Service (Medical): No    Lack of Transportation (Non-Medical): No  Physical Activity: Inactive (04/15/2024)   Exercise Vital Sign    Days of Exercise per Week: 0 days    Minutes of Exercise per Session: 0 min  Stress: No Stress Concern Present (04/15/2024)    [employer] of Occupational Health - Occupational Stress Questionnaire    Feeling of Stress: Not at all  Social Connections: Moderately Integrated (04/15/2024)   Social Connection and Isolation Panel    Frequency of Communication with Friends and Family: More than three times a week    Frequency of Social Gatherings with Friends and Family: Twice a week    Attends Religious Services: More than 4 times per year    Active Member of [employer] or Organizations: Yes    Attends Engineer, structural: More than 4 times per year    Marital Status: Divorced    Tobacco Counseling Counseling given: Not Answered    Clinical Intake:  Pre-visit preparation completed: Yes  Pain : No/denies pain  Diabetes: No  Lab Results  Component Value Date   HGBA1C 5.1 01/21/2017   HGBA1C 5.4 12/13/2015     How often do you need to have someone help you when you read instructions, pamphlets, or other written materials from your doctor or pharmacy?: 1 - Never  Interpreter Needed?: No  Information entered by :: Charmaine Bloodgood LPN   Activities of Daily Living     04/15/2024   11:56 AM  In your present state of health, do you have any difficulty performing the following activities:  Hearing? 0  Vision? 0  Difficulty concentrating or making decisions? 0  Walking or climbing stairs? 0  Dressing or bathing? 0  Doing errands, shopping? 0  Preparing Food and eating ? N  Using the Toilet? N  In the past six months, have you accidently leaked urine? N  Do you have problems with loss of bowel control? N  Managing your Medications? N  Managing your Finances? N  Housekeeping or managing your Housekeeping? N    Patient Care Team: Duanne Butler DASEN, MD as PCP - General (Family Medicine) Loni Soyla LABOR, MD as PCP - Cardiology (Cardiology) Trudy Duwaine BRAVO, NP as Nurse Practitioner (Physical Medicine and Rehabilitation) Eldonna Novel, MD as Consulting Physician (Physical Medicine and  Rehabilitation) [facility] (Gynecology)  I have updated your Care Teams any recent Medical Services you may have received from other providers in the past year.  Assessment:   This is a routine wellness examination for [place].  Hearing/Vision screen Hearing Screening - Comments:: Denies hearing difficulties    Vision Screening - Comments::  up to date with routine eye exams with [facility] [place]    Goals Addressed             This Visit's Progress    Maintain health and independence   On track      Depression Screen     04/15/2024   11:55 AM 01/02/2024   11:46 AM 09/16/2023    2:48 PM 07/16/2023   11:34 AM 03/19/2023   10:17 AM 06/15/2022   12:08 PM 03/15/2022   10:38 AM  PHQ 2/9 Scores  PHQ - 2 Score 0 0 0 0 0 0 0  PHQ- 9 Score       0    Fall Risk     04/15/2024   11:56 AM 03/24/2024    2:07 PM 01/02/2024   11:46 AM 09/16/2023    2:48 PM 07/16/2023   11:34 AM  Fall Risk   Falls in the past year? 0 0 0 0 0  Number falls in past yr: 0 0 0 0 0  Injury with Fall? 0 0 0 0 0  Risk for fall due to : No Fall Risks No Fall Risks No Fall Risks  No Fall Risks  Follow up Falls prevention discussed;Education provided;Falls evaluation completed Falls evaluation completed Falls prevention discussed;Falls evaluation completed  Falls prevention discussed    MEDICARE RISK AT HOME:  Medicare Risk at Home Any stairs in or around the home?: No If so, are there any without handrails?: No Home free of loose throw rugs in walkways, pet beds, electrical cords, etc?: Yes Adequate lighting in your home to reduce risk of falls?: Yes Life alert?: No Use of a cane, walker or w/c?: No Grab bars in the bathroom?: Yes Shower chair or bench in shower?: No Elevated toilet seat or a handicapped toilet?: Yes  TIMED UP AND GO:  Was the test performed?  No  Cognitive Function: Declined/Normal: No cognitive concerns noted by patient or family. Patient alert,  oriented, able to answer questions appropriately and recall recent events. No signs of memory loss or confusion.  Immunizations Immunization History  Administered Date(s) Administered   Fluad Quad(high Dose 65+) 05/21/2019, 06/19/2021, 06/15/2022   Influenza Split 06/20/2012, 06/29/2020   Influenza, High Dose Seasonal PF 06/20/2017, 07/11/2018, 06/29/2020   Influenza,inj,Quad PF,6+ Mos 06/01/2013, 05/13/2014, 06/27/2015   Influenza-Unspecified 06/18/2023   Moderna Sars-Covid-2 Vaccination 02/07/2024   PFIZER(Purple Top)SARS-COV-2 Vaccination 11/30/2019, 12/23/2019, 06/07/2020   Pfizer Covid-19 Vaccine Bivalent Booster 28yrs & up 07/04/2021   Pfizer(Comirnaty)Fall Seasonal Vaccine 12 years and older 06/20/2022, 06/18/2023   Pneumococcal Conjugate-13 10/23/2013   Pneumococcal Polysaccharide-23 06/20/2012   Respiratory Syncytial Virus Vaccine,Recomb Aduvanted(Arexvy) 06/27/2022   Tdap 06/20/2017   Zoster Recombinant(Shingrix ) 08/29/2017, 12/13/2017   Zoster, Live 08/04/2009    Screening Tests Health Maintenance  Topic Date Due   INFLUENZA VACCINE  04/03/2024   COVID-19 Vaccine (7 - Pfizer risk 2024-25 season) 08/08/2024   Medicare Annual Wellness (AWV)  04/15/2025   DTaP/Tdap/Td (2 - Td or Tdap) 06/21/2027   Pneumococcal Vaccine: 50+ Years  Completed   DEXA SCAN  Completed   Hepatitis C Screening  Completed   Zoster Vaccines- Shingrix   Completed   Hepatitis B Vaccines  Aged Out   HPV VACCINES  Aged Out   Meningococcal B Vaccine  Aged Out   Colonoscopy  Discontinued  Health Maintenance  Health Maintenance Due  Topic Date Due   INFLUENZA VACCINE  04/03/2024   Health Maintenance Items Addressed: Records requested for last dexa and mammogram   Additional Screening:  Vision Screening: Recommended annual ophthalmology exams for early detection of glaucoma and other disorders of the eye. Would you like a referral to an eye doctor? No    Dental Screening: Recommended  annual dental exams for proper oral hygiene  Community Resource Referral / Chronic Care Management: CRR required this visit?  No   CCM required this visit?  No   Plan:    I have personally reviewed and noted the following in the patient's chart:   Medical and social history Use of alcohol, tobacco or illicit drugs  Current medications and supplements including opioid prescriptions. Patient is not currently taking opioid prescriptions. Functional ability and status Nutritional status Physical activity Advanced directives List of other physicians Hospitalizations, surgeries, and ER visits in previous 12 months Vitals Screenings to include cognitive, depression, and falls Referrals and appointments  In addition, I have reviewed and discussed with patient certain preventive protocols, quality metrics, and best practice recommendations. A written personalized care plan for preventive services as well as general preventive health recommendations were provided to patient.   Lavelle Pfeiffer [place], [place]   1/86/7974   After Visit Summary: (MyChart) Due to this being a telephonic visit, the after visit summary with patients personalized plan was offered to patient via MyChart   Notes: Nothing significant to report at this time.

## 2024-04-17 ENCOUNTER — Encounter: Payer: Self-pay | Admitting: Family Medicine

## 2024-04-27 ENCOUNTER — Ambulatory Visit: Attending: Neurological Surgery | Admitting: Student in an Organized Health Care Education/Training Program

## 2024-04-27 ENCOUNTER — Encounter: Payer: Self-pay | Admitting: Student in an Organized Health Care Education/Training Program

## 2024-04-27 DIAGNOSIS — G8929 Other chronic pain: Secondary | ICD-10-CM | POA: Insufficient documentation

## 2024-04-27 DIAGNOSIS — M5442 Lumbago with sciatica, left side: Secondary | ICD-10-CM | POA: Insufficient documentation

## 2024-04-27 NOTE — Progress Notes (Signed)
 [facility]         Subjective     History of Present Illness  Helen [name] is a 78 year old female with chronic lower back pain who presents for evaluation of worsening symptoms.    She has been experiencing lower back pain radiating into her left hip and thigh for over a year. The pain is more severe in the back than in the leg and is exacerbated by turning, standing, walking, or sitting for prolonged periods. She rates the pain as 9 out of 10 on average, noting that it is intermittent and worsens in cold and rainy weather. She recalls a fall during Christmas while taking out the trash.    She has previously undergone physical therapy, which did not alleviate her pain. She has not had any injections for her back pain and is currently managing her symptoms with extra strength Tylenol , as she prefers to avoid stronger pain medications. No numbness or tingling in her leg is reported.    Her past medical history includes diabetes, for which she maintains regular exercise despite the pain, completing up to 20 laps three times a week. 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 She has had a CT scan in the past, but no recent imaging such as an MRI due to her inability to tolerate closed MRI machines. She has a history of kidney issues, which limits her ability to take anti-inflammatory medications.    Review of Systems    Objective   There were no vitals taken for this visit.  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 MRI needed for nerve evaluation. NSAIDs contraindicated. She prefers to avoid surgery and is hesitant about  injections.  - Order open MRI at [facility] or [facility], preferably at [facility] location.  - Refer to pain management specialist for consultation on injection therapy options.    Verbal consent obtained to record this visit.     Leonel CHRISTELLA Ave, PA

## 2024-04-30 DIAGNOSIS — D0471 Carcinoma in situ of skin of right lower limb, including hip: Secondary | ICD-10-CM | POA: Diagnosis not present

## 2024-04-30 DIAGNOSIS — Z1283 Encounter for screening for malignant neoplasm of skin: Secondary | ICD-10-CM | POA: Diagnosis not present

## 2024-04-30 DIAGNOSIS — L218 Other seborrheic dermatitis: Secondary | ICD-10-CM | POA: Diagnosis not present

## 2024-04-30 DIAGNOSIS — B351 Tinea unguium: Secondary | ICD-10-CM | POA: Diagnosis not present

## 2024-05-03 ENCOUNTER — Ambulatory Visit
Admission: RE | Admit: 2024-05-03 | Discharge: 2024-05-03 | Disposition: A | Discharge status: Home / Self Care | Source: Ambulatory Visit

## 2024-05-05 ENCOUNTER — Other Ambulatory Visit: Payer: Self-pay

## 2024-05-05 ENCOUNTER — Ambulatory Visit (INDEPENDENT_AMBULATORY_CARE_PROVIDER_SITE_OTHER): Payer: PRIVATE HEALTH INSURANCE | Admitting: Physical Medicine and Rehabilitation

## 2024-05-05 VITALS — BP 171/90 | HR 75

## 2024-05-05 DIAGNOSIS — M5412 Radiculopathy, cervical region: Secondary | ICD-10-CM

## 2024-05-05 MED ORDER — METHYLPREDNISOLONE ACETATE 40 MG/ML IJ SUSP
40.0000 mg | Freq: Once | INTRAMUSCULAR | Status: AC
  Administered 2024-05-05: 40 mg

## 2024-05-05 NOTE — Procedures (Signed)
 Cervical Epidural Steroid Injection - Interlaminar Approach with Fluoroscopic Guidance  Patient: April Beck      Date of Birth: [birth year] MRN: [id number] PCP: Duanne Butler DASEN, MD      Visit Date: 05/05/2024   Universal Protocol:    Date/Time: 05/05/2509:34 AM  Consent Given By: the patient  Position: PRONE  Additional Comments: Vital signs were monitored before and after the procedure. Patient was prepped and draped in the usual sterile fashion. The correct patient, procedure, and site was verified.   Injection Procedure Details:   Procedure diagnoses: Cervical radiculopathy [M54.12]    Meds Administered:  Meds ordered this encounter  Medications   methylPREDNISolone  acetate (DEPO-MEDROL ) injection 40 mg     Laterality: Right  Location/Site: C7-T1  Needle: 3.5 in., 20 ga. Tuohy  Needle Placement: Paramedian epidural space  Findings:  -Comments: Excellent flow of contrast into the epidural space.  Procedure Details: Using a paramedian approach from the side mentioned above, the region overlying the inferior lamina was localized under fluoroscopic visualization and the soft tissues overlying this structure were infiltrated with 4 ml. of 1% Lidocaine  without Epinephrine . A # 20 gauge, Tuohy needle was inserted into the epidural space using a paramedian approach.  The epidural space was localized using loss of resistance along with contralateral oblique bi-planar fluoroscopic views.  After negative aspirate for air, blood, and CSF, a 2 ml. volume of Isovue-250 was injected into the epidural space and the flow of contrast was observed. Radiographs were obtained for documentation purposes.   The injectate was administered into the level noted above.  Additional Comments:  The patient tolerated the procedure well Dressing: 2 x 2 sterile gauze and Band-Aid    Post-procedure details: Patient was observed during the procedure. Post-procedure instructions were  reviewed.  Patient left the clinic in stable condition.

## 2024-05-05 NOTE — Progress Notes (Signed)
 April Beck - 78 y.o. female MRN [id number]  Date of birth: 29-Oct-1945  Office Visit Note: Visit Date: 05/05/2024 PCP: Duanne Butler DASEN, MD Referred by: Duanne Butler DASEN, MD  Subjective: Chief Complaint  Patient presents with   Neck - Pain   HPI:  April Beck is a 78 y.o. female who comes in today at the request of Duwaine Pouch, FNP for planned Right C7-T1 Cervical Interlaminar epidural steroid injection with fluoroscopic guidance.  The patient has failed conservative care including home exercise, medications, time and activity modification.  This injection will be diagnostic and hopefully therapeutic.  Please see requesting physician notes for further details and justification.   ROS Otherwise per HPI.  Assessment & Plan: Visit Diagnoses:    ICD-10-CM   1. Cervical radiculopathy  M54.12 XR C-ARM NO REPORT    Epidural Steroid injection    methylPREDNISolone  acetate (DEPO-MEDROL ) injection 40 mg      Plan: No additional findings.   Meds & Orders:  Meds ordered this encounter  Medications   methylPREDNISolone  acetate (DEPO-MEDROL ) injection 40 mg    Orders Placed This Encounter  Procedures   XR C-ARM NO REPORT   Epidural Steroid injection    Follow-up: Return in about 2 weeks (around 05/19/2024).   Procedures: No procedures performed  Cervical Epidural Steroid Injection - Interlaminar Approach with Fluoroscopic Guidance  Patient: April Beck      Date of Birth: 23-Feb-1946 MRN: [id number] PCP: Duanne Butler DASEN, MD      Visit Date: 05/05/2024   Universal Protocol:    Date/Time: 05/05/2509:34 AM  Consent Given By: the patient  Position: PRONE  Additional Comments: Vital signs were monitored before and after the procedure. Patient was prepped and draped in the usual sterile fashion. The correct patient, procedure, and site was verified.   Injection Procedure Details:   Procedure diagnoses: Cervical radiculopathy [M54.12]    Meds  Administered:  Meds ordered this encounter  Medications   methylPREDNISolone  acetate (DEPO-MEDROL ) injection 40 mg     Laterality: Right  Location/Site: C7-T1  Needle: 3.5 in., 20 ga. Tuohy  Needle Placement: Paramedian epidural space  Findings:  -Comments: Excellent flow of contrast into the epidural space.  Procedure Details: Using a paramedian approach from the side mentioned above, the region overlying the inferior lamina was localized under fluoroscopic visualization and the soft tissues overlying this structure were infiltrated with 4 ml. of 1% Lidocaine  without Epinephrine . A # 20 gauge, Tuohy needle was inserted into the epidural space using a paramedian approach.  The epidural space was localized using loss of resistance along with contralateral oblique bi-planar fluoroscopic views.  After negative aspirate for air, blood, and CSF, a 2 ml. volume of Isovue-250 was injected into the epidural space and the flow of contrast was observed. Radiographs were obtained for documentation purposes.   The injectate was administered into the level noted above.  Additional Comments:  The patient tolerated the procedure well Dressing: 2 x 2 sterile gauze and Band-Aid    Post-procedure details: Patient was observed during the procedure. Post-procedure instructions were reviewed.  Patient left the clinic in stable condition.   Clinical History: CLINICAL DATA:  This study identified as missing a report at 1038 hours on 04/03/2024.   78 year old female with persistent neck pain.   EXAM: MRI CERVICAL SPINE WITHOUT CONTRAST   TECHNIQUE: Multiplanar, multisequence MR imaging of the cervical spine was performed. No intravenous contrast was administered.   COMPARISON:  Cervical spine CT 10/28/2022.  FINDINGS: Alignment: Chronic straightening of cervical lordosis stable from last year. No significant scoliosis. Subtle chronic anterolisthesis of C3 on C4. Similar mild  anterolisthesis of C7 on T1.   Vertebrae: Normal background bone marrow signal. Chronic degenerative endplate osteophytosis and marrow signal changes. Faint degenerative endplate marrow edema asymmetric to the left at C6-C7. Similar patchy degenerative appearing marrow edema at the base of the odontoid (series 107, image 8). And more confluent degenerative appearing marrow edema in the right C1 ring, subjacent C2 articular surface (series 107, images 3 and 4). Severe right side C1-C2 joint space loss and subchondral sclerosis demonstrated by CT last year. Maintained vertebral height.   Cord: Normal.   Posterior Fossa, vertebral arteries, paraspinal tissues: Cervicomedullary junction is within normal limits. Negative visible posterior fossa. Preserved bilateral major vascular flow voids in the neck.   Negative visible neck soft tissues and lung apices.   Disc levels:   C1-C2: Severe asymmetric right side joint space loss and degeneration as seen on CT last year (series 6, image 24 of that exam).   C2-C3: Mild disc bulging. Mild to moderate left facet and ligament flavum hypertrophy. Mild to moderate left C3 foraminal stenosis.   C3-C4: Disc space loss. Chronic anterolisthesis. Circumferential disc bulge with endplate spurring. Moderate facet hypertrophy greater on the left. Effaced ventral CSF space but no significant spinal stenosis (series 106, image 8). Moderate left greater than right C4 foraminal stenosis.   C4-C5: Disc space loss. Circumferential disc osteophyte complex with a broad-based posterior component. Mild posterior element hypertrophy. Mild spinal stenosis. No cord mass effect. Moderate left greater than right C5 foraminal stenosis.   C5-C6: Disc space loss. Circumferential disc osteophyte complex. Mild to moderate facet and ligament flavum hypertrophy. Mild spinal stenosis. No cord mass effect. Moderate to severe right, moderate left C6 foraminal stenosis.    C6-C7: Disc space loss. Circumferential disc osteophyte complex. Mild to moderate posterior element hypertrophy. No significant spinal stenosis. Moderate to severe left, moderate right C7 foraminal stenosis.   C7-T1: Chronic mild anterolisthesis. Mild disc bulging. Moderate to severe facet hypertrophy greater on the left. Moderate ligament flavum hypertrophy. No spinal stenosis. Moderate to severe left C8 neural foraminal stenosis.   Visible upper thoracic spine disc degeneration but fairly capacious visible upper thoracic spinal canal, no visible upper thoracic spinal stenosis (series 106, image 9).   IMPRESSION: 1. 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 Severe chronic joint space loss demonstrated there on CT last year. Patchy acute marrow edema there and at the base of the odontoid on this exam. 2. 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 Mild degenerative anterolisthesis at both C3-C4 and C7-T1 associated with advanced facet degeneration. 3. Mild multifactorial spinal stenosis at C4-C5 and C5-C6. No spinal cord mass effect or signal abnormality. 4. Associated moderate, and occasionally severe, neural foraminal stenosis at the left C3, bilateral C4, C5, C6 (severe on the right), C7 (severe on the left) and left C8 (severe) nerve levels.     Electronically Signed   By: VEAR Hurst M.D.   On: 04/03/2024 11:10     Objective:  VS:  HT:    WT:   BMI:     BP:(!) 171/90  HR:75bpm  TEMP: ( )  RESP:  Physical Exam Vitals and nursing note reviewed.  Constitutional:      General: She is not in acute distress.    Appearance: Normal appearance. She is not ill-appearing.  HENT:     Head: Normocephalic and  atraumatic.     Right Ear: External ear normal.     Left Ear: External ear normal.  Eyes:     Extraocular Movements: Extraocular movements intact.  Cardiovascular:     Rate and Rhythm: Normal rate.     Pulses: Normal pulses.   Musculoskeletal:     Cervical back: Tenderness present. No rigidity.     Right lower leg: No edema.     Left lower leg: No edema.     Comments: Patient has good strength in the upper extremities including 5 out of 5 strength in wrist extension long finger flexion and APB.  There is no atrophy of the hands intrinsically.  There is a negative Hoffmann's test.   Lymphadenopathy:     Cervical: No cervical adenopathy.  Skin:    Findings: No erythema, lesion or rash.  Neurological:     General: No focal deficit present.     Mental Status: She is alert and oriented to person, place, and time.     Sensory: No sensory deficit.     Motor: No weakness or abnormal muscle tone.     Coordination: Coordination normal.  Psychiatric:        Mood and Affect: Mood normal.        Behavior: Behavior normal.      Imaging: No results found.

## 2024-05-05 NOTE — Progress Notes (Signed)
 Pain Scale   Average Pain 7 Patient advising she has neck pain radiating to head giving her a headache, pain is constant        +Driver, -BT, -Dye Allergies.

## 2024-05-11 ENCOUNTER — Encounter: Payer: Self-pay | Admitting: Family Medicine

## 2024-05-12 ENCOUNTER — Encounter: Payer: Self-pay | Admitting: Family Medicine

## 2024-05-12 ENCOUNTER — Other Ambulatory Visit: Payer: Self-pay | Admitting: Family Medicine

## 2024-05-12 ENCOUNTER — Encounter (INDEPENDENT_AMBULATORY_CARE_PROVIDER_SITE_OTHER): Payer: Self-pay | Admitting: Family

## 2024-05-12 ENCOUNTER — Ambulatory Visit (INDEPENDENT_AMBULATORY_CARE_PROVIDER_SITE_OTHER): Admitting: Family

## 2024-05-12 VITALS — BP 127/65 | HR 78 | Temp 98.2°F | Ht 61.22 in | Wt 156.2 lb

## 2024-05-12 DIAGNOSIS — I10 Essential (primary) hypertension: Secondary | ICD-10-CM

## 2024-05-12 DIAGNOSIS — R718 Other abnormality of red blood cells: Secondary | ICD-10-CM

## 2024-05-12 DIAGNOSIS — R079 Chest pain, unspecified: Secondary | ICD-10-CM

## 2024-05-12 DIAGNOSIS — N182 Chronic kidney disease, stage 2 (mild): Secondary | ICD-10-CM

## 2024-05-12 DIAGNOSIS — E1122 Type 2 diabetes mellitus with diabetic chronic kidney disease: Secondary | ICD-10-CM

## 2024-05-12 DIAGNOSIS — K648 Other hemorrhoids: Secondary | ICD-10-CM

## 2024-05-12 DIAGNOSIS — E7800 Pure hypercholesterolemia, unspecified: Secondary | ICD-10-CM

## 2024-05-12 DIAGNOSIS — R159 Full incontinence of feces: Secondary | ICD-10-CM

## 2024-05-12 DIAGNOSIS — K219 Gastro-esophageal reflux disease without esophagitis: Secondary | ICD-10-CM

## 2024-05-12 DIAGNOSIS — Z0000 Encounter for general adult medical examination without abnormal findings: Secondary | ICD-10-CM

## 2024-05-12 MED ORDER — HYDROCORTISONE ACETATE 25 MG RE SUPP
25.0000 mg | Freq: Two times a day (BID) | RECTAL | 1 refills | Status: DC
Start: 2024-05-12 — End: 2024-07-29

## 2024-05-12 NOTE — Progress Notes (Unsigned)
 [facility]  Medicare Wellness Visit         {  Disappearing Text  Click a link below to be taken to that activity or part of the chart   Chart Review  Order Review  Review Flowsheets  Labs  Health Maintenance  Immunizations  Allergies  Medications  Problem List  History  Synopsis   :55325}      Helen [name] is a 78 y.o. female who presents today for the following Medicare Wellness Visit: {AWV Visit Type:73322}    Having bowel leakage; states typical bowels are formed, every now and then will have diarrhea but not consistently. Denies blood/ mucous in stool. Denies abdominal pain. States she does have quite a bit of gas. Feels like she can completely empty bowel. Colonoscopy 2024. States internal hemorrhoids have been an issue- no pain, or bleeding, just bothersome.     Chest pain- left sided, states it is irregular but comes unexpectedly. Self resolves. Not assocaited with any other symptoms. States she has had increased SOB in general- notable with her exercising, states this started about 1 month ago- states it does not feel like it is asthma related. Denies coughing. Denies orthopnea. Denies LE edema.   {  Disappearing Text  Coding Guidelines for Wellness Visits  IPPE - Welcome to Medicare preventive visit (Vision Screening Required) [phone]   Eligibility - Patient is within first 12 months of Medicare Part B enrollment and has not previously had an IPPE.  AWV - Initial [id number]   Eligibility - Patient has been enrolled in Medicare Part B 12 or more months and visit is 12 months after the IPPE (or if patient did not receive an IPPE during 65-month eligibility window)  AWV - Subsequent [phone]  Eligibility - Patient completed an initial AWV (H9561) and at least 12 months have passed since their last AWV.   :69553121}    Health Risk Assessment   During the past month, how would you rate your general health?:  Good  Which of the following tasks can you do without assistance - drive or  take the bus alone; shop for groceries or clothes; prepare your own meals; do your own housework/laundry; handle your own finances/pay bills; eat, bathe or get around your home?: Handle your own finances/pay bills, Shop for groceries or clothes, Eat, bathe, dress or get around your home, Prepare your own meals, Do your own housework/laundry  Which of the following problems have you been bothered by in the past month - dizzy when standing up; problems using the phone; feeling tired or fatigued; moderate or severe body pain?: Moderate or severe body pain  Do you exercise for about 20 minutes 3 or more days per week?:Yes  During the past month was someone available to help if you needed and wanted help?  For example, if you felt nervous, lonely, got sick and had to stay in bed, needed someone to talk to, needed help with daily chores or needed help just taking care of yourself.: Yes  Do you always wear a seat belt?: Yes  Do you have any trouble taking medications the way you have been told to take them?: No  Have you been given any information that can help you with keeping track of your medications?: No  Do you have trouble paying for your medications?: No  Hospitalizations   Hospitalization within past year: No    Screenings         03/23/2024 05/12/2024  Ambulatory Screenings   Falls Risk: Terrilee more than 2 times in past year  N   Falls Risk: Suffer any injuries?  N   Depression: PHQ2 Total Score 0    Depression: PHQ9 Total Score 0       {  Disappearing Text  Substance History  Click to Enter/Edit Substance History then refresh your note   :55325}  Substance Use Disorder Screen:  Yasmen  reports that she has never smoked. She has never been exposed to tobacco smoke. She has never used smokeless tobacco. She reports that she does not drink alcohol and does not use drugs.    {Alcohol Risk Questionnaire (Optional):70215}  {Opioid Misuse Screen - Addiction Behavior Checklist (Optional):70213}  {DAST10  (Optional):70214}    Functional Ability/Level of Safety   Falls Risk/Home Safety Assessment:  Have you been given any information that can help you with hazards in your house, such as scatter rugs, furniture, etc?: No  Do you feel unsteady when standing or walking?: Yes  Do you worry about falling?: Yes  Have you fallen two or more times in the past year?: No  Did you suffer any injuries from your falls in the past year?: No    Home Safety:   Skid-resistant rugs/remove throw rugs, Grab bars , Clear pathways between rooms  , and Proper lighting stairs/bathrooms/bedrooms  Get Up and Go: >20 secs, High risk for falls - Home Safety/Falls Risk Precautions reviewed with pt/family    Hearing Assessment:  hearing aid - left    {  Disappearing Text  Visual Acuity Exam  Click to Enter/Edit Visual Acuity Exam then refresh your note. If the patient was not offered or did not decline the visual acuity portion of this encounter please edit the below statement and provide details   :55325}  Visual Acuity:     If no visual acuity exam above, the patient has declined the visual acuity exam portion of this encounter.  Up to date with Optometry/Ophthalmology per patient report    Exercise:   Walking 3 days per week    Diet:  Diet: - consumes a well balanced diet  {Mini Nutritional Assessment (Optional):70224}    Activities of Daily Living   ADL's  Bathing: Independent  Dressing: Independent  Mobility: Independent  Transfer: Independent  Eating: Independent  Toileting: Independent    IADL's  Phone: Independent  Housekeeping: Independent  Laundry: Independent  Transportation: Requires maximum assistance  Medications: Independent  Finances: Independent    ADL assistance:   Daughter    Social Activities/Engagement   Frequency of Communication with Friends and Family:  never    Frequency of Social Gatherings with Friends and Family:   never    Advanced Care Planning   Discussion of Advance Directives:   Has an Scientist, water quality. A copy  has not been provided. Requested to provide.    {Advanced Care Planning Detailed (Optional):70212}     Exam   BP 127/65 (BP Site: Left arm, Patient Position: Sitting, Cuff Size: Medium)   Pulse 78   Temp 98.2 F (36.8 C) (Oral)   Ht 1.555 m (5' 1.22)   Wt 70.9 kg (156 lb 3.2 oz)   SpO2 97%   BMI 29.30 kg/m   Physical Exam    {EKG Findings Optional:40558:::0}  Evaluation of Cognitive Function   Mood/affect: Appropriate  Appearance:  alert, well appearing, and in no distress  Family member/caregiver input: Not present in room    {  Disappearing Text  Mini-Cog  Step 1- Three word registration (Banana, Sunrise, Chair or Leader, Season, Table)  Step 2- Clock drawing  Step 3- Three word recall   :69553121}  Mini-Cog Score:  3 recalled words - negative screen for dementia    {Additional Cognitive tests - SLUMS, MOCA, MMSE (Optional):70216}    Assessment/Plan     Assessment & Plan  Internal hemorrhoids    Orders:    hydrocortisone  (Anusol -HC) 25 MG suppository; Place 1 suppository (25 mg) rectally 2 (two) times daily    Recurrent chest pain    Orders:    ECG 12 lead    Microcytosis    Orders:    Ferritin; Future    Iron Profile; Future        {Additional Education and Counseling Documentation (Optional):70218}  Personalized Prevention Plan   The patient was provided with a personalized prevention plan via   {PPP Location:70211}                                                                                                                                        {  Disappearing Text  A personalized prevention plan is a medicare requirement and must be included or the claim will be denied. The plan template is located in Patient Instructions and does not need to be included in the body of the note.    :69553121}  History/Care Team   Patient Care Team:  Lyle Laymon GAILS, FNP as PCP - General (Family Nurse Practitioner)  [place] Toribio MATSU, MD as Consulting Physician (Medical Oncology)  Rejeana Channing LABOR,  MD  [place] Liza Alert, MD as Consulting Physician (Allergy and Immunology)  Leonce Caldron as Technician  Vlacancich, Quintin DEL, DO as Consulting Physician (Cardiology)  Medical, surgical, family history reviewed and updated during this encounter  Medication list updated and reconciled during this encounter    Additional Documentation   {Modifier25 Separate Note Documentation (Optional):70217}                                                                                                                                           {     Disappearing Text  Modifier 25 is defined as a significant, seperately identifiable, medically necessary Evaluation and Management (E/M) service by the same provider on the same day of  the other service or procedure. That portion of the visit must be medically necessary and reasonable to treat the beneficiary illness or injury. E&M documentation must support that there has been a significant amount of additional work above and beyond what the provider would normally provide, and the visit can stand alone as a medically necessary billable service.     If the provider renders an E/M service (for example [phone]) in addition to a preventative service (G0402, [phone], or [phone]),   Provider should inform member of their potential responsibility to pay for the deductible/copay for the E/M portion of the service  Submit the CPT code with modifier -25 along with the G code as part of the claims encounter submission (for example [phone] and link [id number] to modifier 25)   :69553121}  Verbal consent obtained to record this visit when ambient technology is utilized.

## 2024-05-13 ENCOUNTER — Other Ambulatory Visit: Payer: Self-pay

## 2024-05-13 ENCOUNTER — Ambulatory Visit (INDEPENDENT_AMBULATORY_CARE_PROVIDER_SITE_OTHER): Payer: Self-pay | Admitting: Family

## 2024-05-13 DIAGNOSIS — N952 Postmenopausal atrophic vaginitis: Secondary | ICD-10-CM

## 2024-05-13 LAB — IRON PROFILE
Iron Saturation: 21 % (ref 15–55)
Iron: 60 ug/dL (ref 27–139)
TIBC: 288 ug/dL (ref 250–450)
UIBC: 228 ug/dL (ref 118–369)

## 2024-05-13 LAB — FERRITIN: Ferritin: 358 ng/mL — ABNORMAL HIGH (ref 15–150)

## 2024-05-13 MED ORDER — ESTRADIOL 0.1 MG/GM VA CREA: 1.0000 | TOPICAL_CREAM | Freq: Every day | VAGINAL | 3 refills | Status: DC

## 2024-05-14 LAB — ECG 12-LEAD
Atrial Rate: 74 {beats}/min
IHS MUSE NARRATIVE AND IMPRESSION: NORMAL
P Axis: 51 degrees
P-R Interval: 190 ms
Q-T Interval: 438 ms
QRS Duration: 142 ms
QTC Calculation (Bezet): 486 ms
R Axis: -36 degrees
T Axis: 13 degrees
Ventricular Rate: 74 {beats}/min

## 2024-05-14 NOTE — Progress Notes (Signed)
 [facility]  [place]  PROGRESS NOTE      Patient: Helen [name]   Date: 05/14/2024   MRN: [id number]     Helen [name] is a 78 y.o. female    Chief Complaint   Patient presents with    Annual Exam     Pt is not fasting       MEDICATIONS     Current Medications[1]    Allergies[2]    SUBJECTIVE     Chief Complaint   Patient presents with    Annual Exam     Pt is not fasting        HPI  Concerns today:  1) Having bowel leakage; states typical bowels are formed, every now and then will have diarrhea but not consistently. Denies blood/ mucous in stool. Denies abdominal pain. States she does have quite a bit of gas. Feels like she can completely empty bowel. Colonoscopy 2024. States internal hemorrhoids have been an issue- no pain, or bleeding, just bothersome.     2) Chest pain- left sided, states it is irregular but comes unexpectedly.  Has seen cardio for this pain in the past. Self resolves. Not assocaited with any other symptoms. States she has had increased SOB in general- notable with her exercising, states this started about 1 month ago- states it does not feel like it is asthma related. Denies coughing. Denies orthopnea. Denies LE edema.       ROS     Review of Systems   Constitutional:  Negative for chills, fever and unexpected weight change.   Respiratory:  Positive for shortness of breath.    Cardiovascular:  Positive for chest pain. Negative for palpitations and leg swelling.   Gastrointestinal:  Positive for diarrhea. Negative for abdominal pain, blood in stool, constipation, nausea and vomiting.   Neurological:  Negative for dizziness, syncope and headaches.       The following portions of the patient's history were reviewed and updated as appropriate: Allergies, Current Medications, Past Family History, Past Medical history, Past social history, Past surgical history, and Problem List.    PHYSICAL EXAM     Vitals:    05/12/24 1053   BP: 127/65   BP Site: Left arm   Patient Position: Sitting    Cuff Size: Medium   Pulse: 78   Temp: 98.2 F (36.8 C)   TempSrc: Oral   SpO2: 97%   Weight: 70.9 kg (156 lb 3.2 oz)   Height: 1.555 m (5' 1.22)     Physical Exam  Vitals and nursing note reviewed.   Constitutional:       General: She is awake. She is not in acute distress.     Appearance: Normal appearance.   Cardiovascular:      Rate and Rhythm: Normal rate and regular rhythm.      Heart sounds: Murmur heard.   Pulmonary:      Effort: Pulmonary effort is normal.      Breath sounds: Normal breath sounds and air entry.   Musculoskeletal:      Right lower leg: No edema.      Left lower leg: No edema.   Neurological:      Mental Status: She is alert and oriented to person, place, and time.   Psychiatric:         Mood and Affect: Mood normal.         Speech: Speech normal.         Behavior: Behavior  normal.             ASSESSMENT/PLAN        1. Internal hemorrhoids  hydrocortisone  (Anusol -HC) 25 MG suppository      2. Incontinence of feces, unspecified fecal incontinence type        3. Recurrent chest pain  ECG 12 lead      4. Microcytosis  Ferritin    Iron Profile    Ferritin    Iron Profile        1-2. Chronic, worsening. UTD with colonoscopy. Will see if tx internal hemorrhoids helps with rectal leakage. Also discussed fiber within diet/ supplement to add bulk to stool and promote complete emptying with BM. If no improvement, consider consult with colorectal.     3. Chronic, recurrent. EKG showing RBBB along with possible left atrial enlargement, left axis deviation and possible LVH. Due to patients symptoms, advised her to consult with her cardiologist for further evaluation. ER for any red flag or worsening symptoms.     4. Chronic. 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 No anemia.     Reviewed med use and side effects. Reviewed s/s that would warrant further and/ or immediate medical attention. Pt in agreement with plan and all questions answered.       Risk & Benefits of the new medication(s)  were explained to the patient (and family) who verbalized understanding & agreed to the treatment plan. Patient (family) encouraged to contact me/clinical staff with any questions/concerns      Return if symptoms worsen or fail to improve.    Signed,  Laymon LULLA Alert, FNP, FNP  05/14/2024         [1]   Current Outpatient Medications   Medication Sig Dispense Refill    albuterol  (PROVENTIL ) (2.5 MG/3ML) 0.083% nebulizer solution TAKE 3 MLS (2.5 MG TOTAL) BY NEBULIZATION EVERY 4 (FOUR) HOURS AS NEEDED FOR SHORTNESS OF BREATH OR WHEEZING 75 mL 1    albuterol  sulfate HFA (PROVENTIL ) 108 (90 Base) MCG/ACT inhaler INHALE 2 PUFFS INTO THE LUNGS EVERY 4 HOURS AS NEEDED FOR WHEEZING OR SHORTNESS OF BREATH. 6.7 each 2    amLODIPine  (NORVASC ) 10 MG tablet TAKE 1 TABLET (10 MG) BY MOUTH DAILY. 90 tablet 0    atorvastatin  (LIPITOR) 40 MG tablet TAKE 1 TABLET BY MOUTH EVERY DAY 90 tablet 0    azelastine  (ASTELIN ) 0.1 % nasal spray 2 sprays by Nasal route 2 (two) times daily Use in each nostril as directed 30 mL 1    Blood Glucose Monitoring Suppl (ONE TOUCH ULTRA 2) W/DEVICE KIT One daily 1 each 0    cetirizine (ZyrTEC) 10 MG tablet Take 1 tablet (10 mg) by mouth once daily      Cholecalciferol (Vitamin D3) 2000 UNIT capsule TAKE 1 CAPSULE BY MOUTH EVERY DAY 90 capsule 1    dexAMETHasone (DECADRON) 0.1 % ophthalmic solution Place 1 drop into both eyes      EPINEPHrine  (EPIPEN  2-PAK) 0.3 MG/0.3ML Solution Auto-injector injection Inject 0.3 mLs (0.3 mg) into the muscle once as needed (Anaphylaxis) 1 each 3    EPINEPHrine  0.3 MG/0.3ML auto-injector 1 DOSE IF NEEDED FOR ANAPHYLAXIS (Patient taking differently: once)      ferrous sulfate 325 (65 FE) MG tablet Take 1 tablet (325 mg) by mouth every morning with breakfast      fluticasone  (FLONASE ) 50 MCG/ACT nasal spray 2 sprays by Nasal route daily      fluticasone  (FLOVENT  HFA) 220 MCG/ACT inhaler INHALE  1 PUFF INTO THE LUNGS TWICE A DAY 24 each 2    glipiZIDE  (GLUCOTROL ) 10 MG tablet  Take 2 tablets (20 mg) by mouth 2 (two) times daily before meals 360 tablet 1    Glucagon , rDNA, (Glucagon  Emergency) 1 MG Kit Inject 1 mg into the skin once as needed (severe hypoglycemia) 1 kit 1    Jardiance  25 MG tablet TAKE 1 TABLET BY MOUTH EVERY MORNING 90 tablet 1    Lancets (onetouch ultrasoft) lancets Use to check blood sugars 2 times daily 200 each 3    lidocaine  (Lidoderm ) 5 % Place 1 patch onto the skin once daily as needed (pain) Remove & Discard patch within 12 hours 30 patch 1    linaGLIPtin  (Tradjenta ) 5 MG Tablet Take 1 tablet (5 mg) by mouth once daily      metFORMIN  (GLUCOPHAGE -XR) 500 MG 24 hr tablet TAKE 2 TABLETS (1,000 MG) BY MOUTH TWICE A DAY 360 tablet 1    montelukast (SINGULAIR) 10 MG tablet Take 1 tablet (10 mg) by mouth daily      NASONEX 50 MCG/ACT nasal spray 2 sprays by Nasal route daily as needed  5    OneTouch Ultra test strip TWICE A DAY 200 strip 3    pantoprazole  (PROTONIX ) 40 MG tablet TAKE 1 TABLET BY MOUTH EVERY DAY 90 tablet 1    valsartan -hydroCHLOROthiazide  (DIOVAN -HCT) 160-12.5 MG per tablet TAKE 1 TABLET BY MOUTH EVERY DAY 90 tablet 0    hydrocortisone  (Anusol -HC) 25 MG suppository Place 1 suppository (25 mg) rectally 2 (two) times daily 14 suppository 1     No current facility-administered medications for this visit.   [2]   Allergies  Allergen Reactions    Aspirin      Break out in sweat and dizziness     Other Environmental     Peppers     Pollen Extract     Latex Itching and Rash

## 2024-05-14 NOTE — Assessment & Plan Note (Signed)
 Chronic, stable. Continue care with endocrinologist

## 2024-05-14 NOTE — Assessment & Plan Note (Signed)
Chronic, stable   Continue current plan of care

## 2024-05-14 NOTE — Patient Instructions (Signed)
 MEDICARE WELLNESS PERSONAL PREVENTION PLAN   As part of the Medicare Wellness portion of your visit today, we are providing you with this personalized preventative plan of care. The list below includes many common screening recommendations from the USPSTF ([place]  Preventive Services Task Force) but is not meant to be comprehensive. You may be eligible for other preventative services depending upon your personal risk factors.     Health Maintenance   Topic Date Due    Advance Directive on File  Never done    Influenza Vaccine  04/03/2024    OPHTHALMOLOGY EXAM  04/17/2024    COVID-19 Vaccine (4 - 2025-26 season) 05/04/2024    Medicare Annual Wellness Visit  05/27/2024    HEMOGLOBIN A1C ANNUAL  09/16/2024    URINE MICROALBUMIN  02/09/2025    DEPRESSION SCREENING  03/23/2025    FALLS RISK ANNUAL  05/12/2025    Tetanus Ten-Year  11/25/2032    HEPATITIS C SCREENING  Completed    DXA Scan  Completed    Shingrix Vaccine 50+  Completed    Pneumonia Vaccine Age 16 Years and Older  Completed    Colorectal Cancer Screening  Discontinued    Pap Smear  Discontinued     Health Maintenance Topics with due status: Overdue       Topic Date Due    Advance Directive on File Never done    Influenza Vaccine 04/03/2024    OPHTHALMOLOGY EXAM 04/17/2024    COVID-19 Vaccine 05/04/2024      Immunization History   Administered Date(s) Administered    COVID-19 mRNA MONOVALENT vaccine PRIMARY SERIES 12 years and above (Moderna) 100 mcg/0.5 mL 12/17/2019, 01/14/2020    COVID-19 mRNA MONOVALENT vaccine PRIMARY SERIES 12 years and above [employer]) 30 mcg/0.3 mL (DILUTE BEFORE USE) 08/03/2020    INFLUENZA TRIVALENT PF SD 6 MO AND OLDER (FLUARIX/FLULAVAL) 07/08/2009, 09/15/2010, 07/09/2011, 06/06/2012, 06/03/2013, 07/04/2013    Influenza (Flu) vaccine 07/08/2009, 09/15/2010, 07/09/2011, 06/06/2012, 06/03/2013, 07/04/2013    Influenza quadrivalent high-dose (FLUZONE  HIGH-DOSE) 65 years and older, 0.7 mL, preservative free 05/02/2019, 05/07/2020,  06/13/2022    Influenza trivalent high-dose (FLUZONE  HIGH-DOSE), 65 years and older, preservative free 05/19/2014, 09/24/2016, 06/12/2017, 06/20/2021, 05/28/2023    Pneumococcal conjugate (PREVNAR 20) 20-valent, preservative free 12/09/2022    Pneumococcal conjugate (PREVNAR) 13-valent 06/10/2018    Pneumococcal polysaccharide (PNEUMOVAX 23), 23-valent 09/15/2010, 09/04/2011, 05/05/2012    RSV vaccine (ABRYSVO), bivalent, RSVpreF A&B, diluent  reconstituted, PF, 0.5 mL 06/07/2023    Tdap (tetanus, diphtheria reduced, acellular pertussis) (ADACEL/BOOSTRIX), adsorbed 04/25/2018, 11/26/2022    Zoster (SHINGRIX) vaccine, recombinant 12/09/2022, 04/23/2023        Colorectal Cancer Screening - All adults 45-75 yrs should undergo periodic colorectal cancer screening. The decision to screen for colorectal cancer in adults aged 70 to 64 years should be an individual one, taking into account your overall health and prior screening history.   Breast Cancer Screening - Women aged 40-37yrs should have mammograms every other year (please note that this recommendation may not be appropriate for every woman - your physician can answer specific questions you may have). The USPSTF concludes that the current evidence is insufficient to assess the balance of benefits and harms of screening mammography in women aged 4 years or older.  These recommendations do not apply to persons who have a genetic marker or syndrome associated with a high risk of breast cancer (eg, BRCA1 or BRCA2 genetic variation), a history of high-dose radiation therapy to the chest at a young age, or  previous breast cancer or a high-risk breast lesion on previous biopsies.   Cervical Cancer Screening - Women over 51 do not require pap smears as long as prior screening has been normal and are not otherwise at high risk for cervical cancer. The USPSTF recommends screening for cervical cancer every 3 years with cervical cytology alone in women aged 14 to 59 years. For  women aged 54 to 68 years, the USPSTF recommends screening every 3 years with cervical cytology alone, every 5 years with high-risk human papillomavirus (hrHPV) testing alone, or every 5 years with hrHPV testing in combination with cytology (cotesting).   Osteoporosis Screening -  The USPSTF recommends screening for osteoporosis with bone measurement testing to prevent osteoporotic fractures in women 65 years and older.  For postmenopausal women younger than 65 years who are at increased risk of osteoporosis, as determined by a formal clinical risk assessment tool, the USPSTF recommends screening for osteoporosis with bone measurement testing to prevent osteoporotic fractures.   Hepatitis C Screening - Recommend screening for hepatitis C virus (HCV) infection in all adults aged 70 to 46 years.  Lung cancer Screening - Recommend annual screening for lung cancer with low-dose computed tomography (LDCT) in adults ages 7 to 70 years who have a 20 pack-year smoking history and currently smoke or have quit within the past 15 years.  Recommended Vaccinations from the CDC ([place]  Centers for Disease Control and Prevention)   Influenza one dose annually   COVID vaccine - stay current with the most up-to-date COVID update/booster  Tetanus/diphtheria (Tdap) one booster every 10 years   Zoster/Shingles - (Shingrix) two doses after age 50 (second dose given 2-6 months after first dose)  Pneumococcal 20-valent or 21-valent conjugate vaccine - one dose for adults aged >=65 years with no history of prior pneumococcal vaccination.  Pneumococcal 20-valent or 21-valent conjugate vaccine -  one dose for adults aged >=65 years with PPSV23 only or PCV13 only given more than 1 yr ago.  Pneumococcal 20-valent or 21-valent conjugate vaccine - one dose for adults aged >=65 years who have completed PCV-13 and PPSV23 after 78yrs old and it has been greater than 28yrs (shared clinical decision-making is recommended regarding administration of  this vaccine).   RSV (Respiratory Syncytial Virus) vaccine for everyone ages 64 and older  RSV (Respiratory Syncytial Virus)  vaccine ages 89-74 who are at increased risk of severe RSV disease (for example, chronic heart/lung/liver/kidney disease, immunocompromised)    PERSONAL PREVENTION PLAN   Your Personal Prevention Plan is based on your overall health and your responses to the health questionnaire you completed. The following information is for you to review in addition to the recommendations, referrals, and tests we have discussed at your visit.     Physical Activity:   Physical activity can help you maintain a healthy weight, prevent or control illness, reduce stress, and sleep better. It can also help you improve your balance to avoid falls. Try to build up to and maintain a total of 30 minutes of activity each day. If you are able, try walking, doing yard or housework, and taking the stairs more often. You can also strengthen your muscles with exercises done while sitting or lying down. Web resource - https://www.carpenter-henry.info/  Emotional Health:   Feeling "down in the dumps" or anxious every now and then is a natural part of life. If this feeling lasts for a few weeks or more, talk with me as soon as possible. It could be a sign  of a problem that needs treatment. There are many types of treatment available. Web resource -  RXPreview.de  Falls:   You can reduce your risk of falling by making changes in your home. Remove items that may cause tripping, improve lighting, and consider installing grab bars.   Talk with me if you have problems with balance and walking. To prevent falls, you may need your vision, hearing, or blood pressure checked. Exercises to improve your strength and balance, or using a cane or walker, may help. Review your medicines with me at every visit, because some can affect balance. Please be sure to let me know if you fall or are fearful you may  fall. Web resource - BounceThru.fi  Urinary Leakage:   Urine leakage is common, but it is not a normal part of aging. Talk with me about any urine leakage so that the cause can be found and treated. Treatment can include bladder training, exercises, medicine or surgery.   Pain:   We all have aches and pains at times, but chronic pain can change how you feel and live every day. Please talk with me about any symptoms of chronic pain so that we can determine how best to treat.   Sleep:   Getting a good night's sleep is vital to your health and well-being and can help prevent or manage health problems. Often, sleep can be improved by changing behaviors, including when you go to bed and what you do before bed. Sleep apnea can cause problems such as struggling to stay awake during the day. Please let me know if you would like to learn more about improving your sleep and/or think you may have sleep apnea. Web resource -  ThousandQuestions.com.cy  Seat Belt:   Please remember to wear a seat belt when driving or riding in a vehicle. It is one of the most important things you can do to stay safe in a car.   Nutrition:   Remember to eat plenty of fruits, vegetables, whole grains, and dairy. Drink at least 64 ounces (8 full glasses) of water a day, unless you have been advised to limit fluids.  Web resource- PickSeat.dk  Alcohol:   Alcohol can have a greater effect on older people, who may feel its effects at a lower amount. Older people should limit alcoholic drinks (no more than one a day for women and no more than two a day for men). Please let me know if alcohol use becomes a problem.  Web resource - AgingMortgage.ca  Tobacco:   Not smoking or using other forms of tobacco is one of the most important things you can do for your health. Here is some more information about the importance of quitting smoking and how to quit  smoking - Mudlogger - BroadJournal.com.pt  Advance Directives:   There may come a time when medical decisions need to be made on your behalf. Please talk with your family, and with me, about your wishes. It is important to provide information about your decisions, and any formal advance directives, for your medical record. Here is additional information on advanced directives - Web resource - MediaExhibitions.no  Additional Support:   Sometimes it can be challenging to manage all aspects of daily life. Finding the right support can help you maintain or improve your health and independence. Please let me know if you would like to talk further about finding resources to assist you.

## 2024-05-18 ENCOUNTER — Other Ambulatory Visit (INDEPENDENT_AMBULATORY_CARE_PROVIDER_SITE_OTHER): Payer: Self-pay | Admitting: Nurse Practitioner

## 2024-05-18 ENCOUNTER — Other Ambulatory Visit (INDEPENDENT_AMBULATORY_CARE_PROVIDER_SITE_OTHER): Payer: Self-pay | Admitting: Family

## 2024-05-18 DIAGNOSIS — E119 Type 2 diabetes mellitus without complications: Secondary | ICD-10-CM

## 2024-05-18 DIAGNOSIS — I10 Essential (primary) hypertension: Secondary | ICD-10-CM

## 2024-05-18 DIAGNOSIS — J302 Other seasonal allergic rhinitis: Secondary | ICD-10-CM

## 2024-05-18 DIAGNOSIS — E7800 Pure hypercholesterolemia, unspecified: Secondary | ICD-10-CM

## 2024-05-19 NOTE — Telephone Encounter (Signed)
 LOV 03/23/24    Next OV - 05/21/24    Labs - 03/23/24

## 2024-05-19 NOTE — Telephone Encounter (Signed)
 Last filled June 2025.   Last o/v September 2025.  Patient does not have an upcoming appointment  Queued up 90 with 0 refills.

## 2024-05-21 ENCOUNTER — Encounter (INDEPENDENT_AMBULATORY_CARE_PROVIDER_SITE_OTHER): Payer: Self-pay | Admitting: Family

## 2024-05-21 ENCOUNTER — Encounter (INDEPENDENT_AMBULATORY_CARE_PROVIDER_SITE_OTHER): Payer: Self-pay

## 2024-05-21 ENCOUNTER — Ambulatory Visit (FREE_STANDING_LABORATORY_FACILITY)

## 2024-05-21 VITALS — BP 136/66 | HR 73 | Ht 61.0 in | Wt 158.0 lb

## 2024-05-21 DIAGNOSIS — E1165 Type 2 diabetes mellitus with hyperglycemia: Secondary | ICD-10-CM

## 2024-05-21 DIAGNOSIS — E119 Type 2 diabetes mellitus without complications: Secondary | ICD-10-CM

## 2024-05-21 LAB — URINE MICROALBUMIN, RANDOM
Urine Creatinine: 40 mg/dL
Urine Microalbumin/Creatinine Ratio: 20 ug/mg (ref <30)
Urine Microalbumin: 8 ug/mL (ref 0.0–30.0)

## 2024-05-21 LAB — POCT HEMOGLOBIN A1C: POCT Hgb A1C: 7.2 % — AB (ref 4.0–5.9)

## 2024-05-21 NOTE — Patient Instructions (Signed)
 1) Urine test following this appointment

## 2024-05-21 NOTE — Progress Notes (Signed)
 Diabetes New Patient Encounter    Chief Complaint: T2DM  Referring Physician: Jesusita Cruel, NP  Persistent PMH: Breast cancer, IDA    Interval Events:  No updates to health or medications     Diabetes HPI:  1) Diagnosis: 1991  Elevated hemoglobin A1C on routine labs  Previous gestational diabetes   2) Previous provider: Jesusita Cruel, NP  3) Hemoglobin A1C: 7.2  May be unreliable ISO iron supplementation   4) Contraindications to diabetes medications:  No PMH pancreatitis/no FHX thyroid  cancer  Reports GERD  No frequent UTI/yeast infections  No PMH bladder cancer or heart failure  5) Medication intolerances: None  6) Current diabetes medications:   Jardiance  25mg  QD  Tradjenta  5mg  QD  Metformin  1000mg  BID  Glipizide  10mg  BID  7) Blood glucose monitoring: Checks FSBG BID  Fasting BG range: 104-144 mg/dL  Postprandial BG range: 101-130 mg/dL  8) Hypoglycemia: Denies  Hypoglycemic awareness intact  Articulates symptoms of hypoglycemia  Prescribed emergency glucagon    9) Comorbidities: HTN, HLD  Blood pressure: Borderline-elevated  Primary prevention: Atorvastatin    MASH/MAFLD: FIB-4 = 1.77   10) Complications:  No diabetic retinopathy or macular edema (01/2024)  -Seen at [facility])  Normal renal function (02/2024)  Normal urine ACR (02/2020)  Denies peripheral neuropathy   Normal DFE (03/2024)  Followed by external Podiatrist   No documented CAD/PVD or previous CVA  11) Lifestyle modifications:  Previous Nutrition consultation   Walks for exercise     Other endocrine conditions: None    Other important information:  Prescribed iron for IDA    Pertinent FHX:  T2DM: Mother, sister   T1DM: None  CVD/CVA: Sister  Autoimmune disease: None    PMH:  Past Medical History:   Diagnosis Date    Anemia     iron    currently on medication      Arthritis     general body      Arthritis     Generalized    Asthma     Asthma without status asthmaticus     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     Bilateral cataracts     Breast cancer (CMS/HCC)     Breast lump     Cancer (CMS/HCC)     breast cancer    Carcinoma (CMS/HCC)     Carcinoma of the bladder    Chicken pox     Childhood illness    Conduction Disorder     RBBB    Diabetes mellitus type II     Non-Insulin  dependent    Echocardiogram 10/2013    Gastroesophageal reflux disease     Heart murmur     Hemorrhoids without complication     Holter monitor 02/2014    Hyperlipidemia     Hypertensive disorder     Malignant neoplasm of breast (CMS/HCC)     Measles     Childhood illness    Mumps     Childhood illness    Myocardial perfusion scan 11/2013, 12/2013    MPI single Isotope excrise; MPI dual Isotope Lexiscan     Rash     RBBB     Spinal stenosis     Stage 2 chronic kidney disease 10/29/2023    Syncope       Medications:  Medications Ordered Prior to Encounter[1]     Allergies:  Allergies[2]     Social History:  Social History[3]     ROS: A 12-point  ROS was performed and negative unless as noted above or listed here    Physical Exam:  Vitals:    05/21/24 1254   BP: 136/66   Pulse: 73      GEN: alert and interactive, AA race, NAD  HEENT: anicteric sclera, EOMI, fair dentition  NECK: no thyromegaly or palpable nodules  HEART: regular rate and rhythm  LUNGS: breathing unlabored  EXT: WWP, no peripheral edema  NEURO: answers questions appropriately, normal gait  PSYCH: normal mood and affect  SKIN: no obvious lesions to exposed skin    Labs:   02/10/24 00:00   Glucose 128 (H)   BUN 19   Creatinine 0.92   BUN / Creatinine Ratio 21   Sodium 139   Potassium 4.3   Chloride 101   CO2 21   Calcium  9.7   EGFR 64      10/28/23 00:00   AST 24   ALT 18   Alkaline Phosphatase 85   Albumin 4.4   Protein Total 7.2   Globulin, Total 2.8   Bilirubin Total 0.8      10/28/23 00:00   TSH 0.975      03/23/24 00:00   Cholesterol 137   HDL 88   LDL Calculated 36   Triglycerides 60   Cholesterol / HDL Ratio 1.6   VLDL Calculated 13      02/10/24 00:00   Creatinine, UR 28.4      Assessment/Plan:  1) T2DM c/b IDA (prescribed iron): Hemoglobin A1C meets ADA targets. FSBG data appears concordant. Denies hypoglycemia. No prescribed changes to current diabetes medications. Consider STOPPING SU ISO geriatric patient. Due for urine ACR. Primary prevention appropriate. Blood pressure borderline-elevated.   -Urine ACR  -Reassess BP at next appointment     FU: 6 months    The risks and benefits of the aforementioned treatment plan were discussed.  All of the patient's questions were addressed and answered to his/her satisfaction    ------------------------------------------------------------------------  [place]aziland Perlman, MD  [facility]         [1]   Current Outpatient Medications on File Prior to Visit   Medication Sig Dispense Refill    albuterol  (PROVENTIL ) (2.5 MG/3ML) 0.083% nebulizer solution TAKE 3 MLS (2.5 MG TOTAL) BY NEBULIZATION EVERY 4 (FOUR) HOURS AS NEEDED FOR SHORTNESS OF BREATH OR WHEEZING 75 mL 1    amLODIPine  (NORVASC ) 10 MG tablet TAKE 1 TABLET (10 MG) BY MOUTH DAILY. 90 tablet 3    atorvastatin  (LIPITOR) 40 MG tablet TAKE 1 TABLET BY MOUTH EVERY DAY 90 tablet 3    Azelastine  HCl 137 MCG/SPRAY Solution 2 SPRAYS BY NASAL ROUTE 2 (TWO) TIMES DAILY USE IN EACH NOSTRIL AS DIRECTED 30 mL 1    Blood Glucose Monitoring Suppl (ONE TOUCH ULTRA 2) W/DEVICE KIT One daily 1 each 0    cetirizine (ZyrTEC) 10 MG tablet Take 1 tablet (10 mg) by mouth once daily      Cholecalciferol (Vitamin D3) 2000 UNIT capsule TAKE 1 CAPSULE BY MOUTH EVERY DAY 90 capsule 1    dexAMETHasone (DECADRON) 0.1 % ophthalmic solution Place 1 drop into both eyes      EPINEPHrine  (EPIPEN  2-PAK) 0.3 MG/0.3ML Solution Auto-injector injection Inject 0.3 mLs (0.3 mg) into the muscle once as needed (Anaphylaxis) 1 each 3    ferrous sulfate 325 (65 FE) MG tablet Take 1 tablet (325 mg) by mouth every morning with breakfast      fluticasone  (FLONASE ) 50 MCG/ACT nasal spray 2  sprays by Nasal route daily      fluticasone   (FLOVENT  HFA) 220 MCG/ACT inhaler INHALE 1 PUFF INTO THE LUNGS TWICE A DAY 24 each 2    glipiZIDE  (GLUCOTROL ) 10 MG tablet Take 2 tablets (20 mg) by mouth 2 (two) times daily before meals 360 tablet 1    hydrocortisone  (Anusol -HC) 25 MG suppository Place 1 suppository (25 mg) rectally 2 (two) times daily 14 suppository 1    Jardiance  25 MG tablet TAKE 1 TABLET BY MOUTH EVERY MORNING 90 tablet 1    lidocaine  (Lidoderm ) 5 % Place 1 patch onto the skin once daily as needed (pain) Remove & Discard patch within 12 hours 30 patch 1    linaGLIPtin  (Tradjenta ) 5 MG Tablet TAKE 1 TABLET BY MOUTH EVERY DAY 90 tablet 0    metFORMIN  (GLUCOPHAGE -XR) 500 MG 24 hr tablet TAKE 2 TABLETS (1,000 MG) BY MOUTH TWICE A DAY 360 tablet 1    montelukast (SINGULAIR) 10 MG tablet Take 1 tablet (10 mg) by mouth daily      NASONEX 50 MCG/ACT nasal spray 2 sprays by Nasal route daily as needed  5    OneTouch Ultra test strip TWICE A DAY 200 strip 3    pantoprazole  (PROTONIX ) 40 MG tablet TAKE 1 TABLET BY MOUTH EVERY DAY 90 tablet 1    valsartan -hydroCHLOROthiazide  (DIOVAN -HCT) 160-12.5 MG per tablet TAKE 1 TABLET BY MOUTH EVERY DAY 90 tablet 0    albuterol  sulfate HFA (PROVENTIL ) 108 (90 Base) MCG/ACT inhaler INHALE 2 PUFFS INTO THE LUNGS EVERY 4 HOURS AS NEEDED FOR WHEEZING OR SHORTNESS OF BREATH. 6.7 each 2    EPINEPHrine  0.3 MG/0.3ML auto-injector 1 DOSE IF NEEDED FOR ANAPHYLAXIS (Patient taking differently: once)      Glucagon , rDNA, (Glucagon  Emergency) 1 MG Kit Inject 1 mg into the skin once as needed (severe hypoglycemia) 1 kit 1    Lancets (onetouch ultrasoft) lancets Use to check blood sugars 2 times daily 200 each 3     No current facility-administered medications on file prior to visit.   [2]   Allergies  Allergen Reactions    Aspirin      Break out in sweat and dizziness     Other Environmental     Peppers     Pollen Extract     Latex Itching and Rash   [3]   Social History  Socioeconomic History    Marital status: Divorced    Tobacco Use    Smoking status: Never     Passive exposure: Never    Smokeless tobacco: Never   Vaping Use    Vaping status: Never Used   Substance and Sexual Activity    Alcohol use: No    Drug use: Never    Sexual activity: Not Currently     Social Drivers of Health     Financial Resource Strain: Low Risk (11/04/2023)    Overall Financial Resource Strain (CARDIA)     Difficulty of Paying Living Expenses: Not hard at all   Food Insecurity: Food Insecurity Present (11/04/2023)    Hunger Vital Sign     Worried About Running Out of Food in the Last Year: Sometimes true     Ran Out of Food in the Last Year: Never true   Transportation Needs: No Transportation Needs (11/04/2023)    PRAPARE - Therapist, art (Medical): No     Lack of Transportation (Non-Medical): No   Physical Activity: Insufficiently Active (11/04/2023)  Exercise Vital Sign     Days of Exercise per Week: 3 days     Minutes of Exercise per Session: 30 min   Stress: No Stress Concern Present (11/04/2023)    [employer] of Occupational Health - Occupational Stress Questionnaire     Feeling of Stress : Not at all   Social Connections: Socially Isolated (11/04/2023)    Social Connection and Isolation Panel     Frequency of Communication with Friends and Family: Once a week     Frequency of Social Gatherings with Friends and Family: Once a week     Attends Religious Services: More than 4 times per year     Active Member of [employer] or Organizations: No     Attends Banker Meetings: Never     Marital Status: Divorced   Catering manager Violence: Not At Risk (11/04/2023)    Humiliation, Afraid, Rape, and Kick questionnaire     Fear of Current or Ex-Partner: No     Emotionally Abused: No     Physically Abused: No     Sexually Abused: No   Housing Stability: Unknown (11/04/2023)    Housing Stability Vital Sign     Unable to Pay for Housing in the Last Year: No     Homeless in the Last Year: No

## 2024-05-22 ENCOUNTER — Encounter (INDEPENDENT_AMBULATORY_CARE_PROVIDER_SITE_OTHER): Payer: Self-pay

## 2024-05-26 ENCOUNTER — Ambulatory Visit (INDEPENDENT_AMBULATORY_CARE_PROVIDER_SITE_OTHER): Payer: PRIVATE HEALTH INSURANCE | Admitting: Physical Medicine and Rehabilitation

## 2024-05-26 ENCOUNTER — Encounter: Payer: Self-pay | Admitting: Physical Medicine and Rehabilitation

## 2024-05-26 DIAGNOSIS — M542 Cervicalgia: Secondary | ICD-10-CM | POA: Diagnosis not present

## 2024-05-26 DIAGNOSIS — M47812 Spondylosis without myelopathy or radiculopathy, cervical region: Secondary | ICD-10-CM

## 2024-05-26 NOTE — Progress Notes (Unsigned)
 CALAYAH GUADARRAMA - 78 y.o. female MRN [id number]  Date of birth: 1946-04-25  Office Visit Note: Visit Date: 05/26/2024 PCP: Duanne Butler DASEN, MD Referred by: Duanne Butler DASEN, MD  Subjective: Chief Complaint  Patient presents with   Neck - Pain   HPI: April Beck is a 78 y.o. female who comes in today for evaluation of chronic, worsening and severe  bilateral neck pain radiating up to right side of head, intermittent pain radiating to shoulders and down both arms to elbows. She is here today in follow up from recent right C7-T1 interlaminar epidural steroid on 05/05/2024. She reports less than 50% relief of pain for about 1 week. Her biggest pain generator today is right sided neck pain. Her pain is most severe in the morning after waking up, describes as stiff sensation, currently rates as 6 out of 10. She does have pain with side to side rotation of neck. Recent cervical MRI imaging shows acute exacerbation of right C1-C2 arthritis. There is patchy acute marrow edema noted. There is also multi level degenerative changes and foraminal stenosis. There is moderate and severe multi level foraminal stenosis noted. No high grade spinal canal stenosis. Patient denies focal weakness, numbness and tingling. No recent trauma or falls.   She is schedule to consult with Dr. Jeannetta in January.      Review of Systems  Musculoskeletal:  Positive for myalgias and neck pain.  Neurological:  Negative for tingling, sensory change, focal weakness and weakness.  All other systems reviewed and are negative.  Otherwise per HPI.  Assessment & Plan: Visit Diagnoses:    ICD-10-CM   1. Cervicalgia  M54.2     2. Spondylosis without myelopathy or radiculopathy, cervical region  M47.812     3. Facet arthropathy, cervical  M47.812        Plan: Findings:  Chronic, worsening and severe  bilateral neck pain radiating up to right side of head, intermittent pain radiating to shoulders and down both arms to  elbows. Biggest pain generator at this time is right sided neck pain. I do think her pain is more facet mediated. Prior cervical MRI imaging does show significant arthritis with marrow edema at C1-C2. No relief with recent cervical epidural steroid injection. We discussed treatment plan in detail today. I explained to her that a facet injection at C1-C2 would be difficult due to the anatomy of vertebral arteries. I placed order for diagnostic and hopefully therapeutic right C2-C3 facet joint injection under fluoroscopic guidance. If good relief of pain we can repeat this procedure infrequently as needed, we also possibility of longer sustained pain relief with radiofrequency ablation. She has no questions at this time. No red flag symptoms noted upon exam today.     Meds & Orders: No orders of the defined types were placed in this encounter.  No orders of the defined types were placed in this encounter.   Follow-up: Return for Right C2-C3 facet joint injection .   Procedures: No procedures performed      Clinical History: CLINICAL DATA:  This study identified as missing a report at 1038 hours on 04/03/2024.   78 year old female with persistent neck pain.   EXAM: MRI CERVICAL SPINE WITHOUT CONTRAST   TECHNIQUE: Multiplanar, multisequence MR imaging of the cervical spine was performed. No intravenous contrast was administered.   COMPARISON:  Cervical spine CT 10/28/2022.   FINDINGS: Alignment: Chronic straightening of cervical lordosis stable from last year. No significant scoliosis. Subtle chronic anterolisthesis  of C3 on C4. Similar mild anterolisthesis of C7 on T1.   Vertebrae: Normal background bone marrow signal. Chronic degenerative endplate osteophytosis and marrow signal changes. Faint degenerative endplate marrow edema asymmetric to the left at C6-C7. Similar patchy degenerative appearing marrow edema at the base of the odontoid (series 107, image 8). And more confluent  degenerative appearing marrow edema in the right C1 ring, subjacent C2 articular surface (series 107, images 3 and 4). Severe right side C1-C2 joint space loss and subchondral sclerosis demonstrated by CT last year. Maintained vertebral height.   Cord: Normal.   Posterior Fossa, vertebral arteries, paraspinal tissues: Cervicomedullary junction is within normal limits. Negative visible posterior fossa. Preserved bilateral major vascular flow voids in the neck.   Negative visible neck soft tissues and lung apices.   Disc levels:   C1-C2: Severe asymmetric right side joint space loss and degeneration as seen on CT last year (series 6, image 24 of that exam).   C2-C3: Mild disc bulging. Mild to moderate left facet and ligament flavum hypertrophy. Mild to moderate left C3 foraminal stenosis.   C3-C4: Disc space loss. Chronic anterolisthesis. Circumferential disc bulge with endplate spurring. Moderate facet hypertrophy greater on the left. Effaced ventral CSF space but no significant spinal stenosis (series 106, image 8). Moderate left greater than right C4 foraminal stenosis.   C4-C5: Disc space loss. Circumferential disc osteophyte complex with a broad-based posterior component. Mild posterior element hypertrophy. Mild spinal stenosis. No cord mass effect. Moderate left greater than right C5 foraminal stenosis.   C5-C6: Disc space loss. Circumferential disc osteophyte complex. Mild to moderate facet and ligament flavum hypertrophy. Mild spinal stenosis. No cord mass effect. Moderate to severe right, moderate left C6 foraminal stenosis.   C6-C7: Disc space loss. Circumferential disc osteophyte complex. Mild to moderate posterior element hypertrophy. No significant spinal stenosis. Moderate to severe left, moderate right C7 foraminal stenosis.   C7-T1: Chronic mild anterolisthesis. Mild disc bulging. Moderate to severe facet hypertrophy greater on the left. Moderate  ligament flavum hypertrophy. No spinal stenosis. Moderate to severe left C8 neural foraminal stenosis.   Visible upper thoracic spine disc degeneration but fairly capacious visible upper thoracic spinal canal, no visible upper thoracic spinal stenosis (series 106, image 9).   IMPRESSION: 1. 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 Severe chronic joint space loss demonstrated there on CT last year. Patchy acute marrow edema there and at the base of the odontoid on this exam. 2. 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 Mild degenerative anterolisthesis at both C3-C4 and C7-T1 associated with advanced facet degeneration. 3. Mild multifactorial spinal stenosis at C4-C5 and C5-C6. No spinal cord mass effect or signal abnormality. 4. Associated moderate, and occasionally severe, neural foraminal stenosis at the left C3, bilateral C4, C5, C6 (severe on the right), C7 (severe on the left) and left C8 (severe) nerve levels.     Electronically Signed   By: VEAR Hurst M.D.   On: 04/03/2024 11:10   She reports that she has never smoked. She has never used smokeless tobacco. No results for input(s): HGBA1C, LABURIC in the last 8760 hours.  Objective:  VS:  HT:    WT:   BMI:     BP:   HR: bpm  TEMP: ( )  RESP:  Physical Exam Vitals and nursing note reviewed.  HENT:     Head: Normocephalic and atraumatic.     Right Ear: External ear normal.     Left Ear: External  ear normal.     Nose: Nose normal.     Mouth/Throat:     Mouth: Mucous membranes are moist.  Eyes:     Extraocular Movements: Extraocular movements intact.  Cardiovascular:     Rate and Rhythm: Normal rate.     Pulses: Normal pulses.  Pulmonary:     Effort: Pulmonary effort is normal.  Abdominal:     General: Abdomen is flat. There is no distension.  Musculoskeletal:        General: Tenderness present.     Cervical back: Tenderness present.     Comments: Discomfort noted with  side-to-side rotation. Patient has good strength in the upper extremities including 5 out of 5 strength in wrist extension, long finger flexion and APB. Shoulder range of motion is full bilaterally without any sign of impingement. There is no atrophy of the hands intrinsically. Sensation intact bilaterally. Myofascial tenderness noted to bilateral levator scapulae regions upon palpation. Negative Hoffman's sign. Negative Spurling's sign.   Skin:    General: Skin is warm and dry.     Capillary Refill: Capillary refill takes less than 2 seconds.  Neurological:     General: No focal deficit present.     Mental Status: She is alert and oriented to person, place, and time.  Psychiatric:        Mood and Affect: Mood normal.        Behavior: Behavior normal.     Ortho Exam  Imaging: No results found.  Past Medical/Family/Surgical/Social History: Medications & Allergies reviewed per EMR, new medications updated. Patient Active Problem List   Diagnosis Date Noted   Cerumen impaction 09/16/2023   Acute sinusitis 09/16/2023   Suburethral sling present 04/25/2023   Arthritis of carpometacarpal [facility]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 01/16/2022   Ovarian cyst 01/16/2021   Chronic constipation 11/21/2020   CKD (chronic kidney disease) stage 3, GFR 30-59 ml/min (HCC) 11/21/2020   Herpes 11/21/2020   Hypokalemia 11/21/2020   Weakness 11/17/2020   Aspiration into airway    Fall    Dysphagia 06/14/2013   Atrophic vaginitis 06/14/2013   Hypercholesteremia    Hypertension    GERD (gastroesophageal reflux disease)    Hypothyroidism    Renal insufficiency 06/19/2012   Past Medical History:  Diagnosis Date   Anxiety    hx of panic attack   Arthritis    hands & knees   CKD (chronic kidney disease), stage III (HCC)    followed by pcp   Closed fracture of left distal radius 10/31/2021   Edema of both lower extremities    GERD (gastroesophageal reflux disease)    History of 2019 novel coronavirus disease  (COVID-19) 09/17/2020   positive result in epic,  per pt very mild symtpoms that resolved   History of uterine fibroid    Hypertension    followed by pcp   Hypomagnesemia    Hypothyroidism    followed by pcp   IDA (iron deficiency anemia)    Osteopenia    Ovarian cyst    Pelvic pain    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, takes meloxicam    Wears contact lenses    Family History  Problem Relation Age of Onset   Colon polyps Brother    Heart disease Brother    Arthritis Father    Stroke Father    Breast cancer Maternal Grandmother    Diabetes Brother    Hypertension Brother    Colon cancer Neg Hx  Esophageal cancer Neg Hx    Rectal cancer Neg Hx    Stomach cancer Neg Hx    Past Surgical History:  Procedure Laterality Date   ABDOMINAL HYSTERECTOMY  1982   CATARACT EXTRACTION Bilateral    CHOLECYSTECTOMY  06/20/2012   Procedure: LAPAROSCOPIC CHOLECYSTECTOMY;  Surgeon: Dann FORBES Hummer, MD;  Location: [facility] OR;  Service: General;  Laterality: N/A;   COLONOSCOPY  last one 03-20-2019  dr teressa   DIAGNOSTIC LAPAROSCOPY  yrs ago   EYE SURGERY  09/27/22   Cararact removal and lens implant   HIP ARTHROPLASTY Right 11/13/2020   Procedure: ARTHROPLASTY BIPOLAR HIP (HEMIARTHROPLASTY);  Surgeon: Margrette Taft FORBES, MD;  Location: [facility];  Service: Orthopedics;  Laterality: Right;   INCONTINENCE SURGERY  09-11-2005   dr watt  [email or web address]    [facility]   JOINT REPLACEMENT  11/12/20   LAPAROSCOPIC SALPINGO OOPHERECTOMY Bilateral 01/16/2021   Procedure: DIAGNOSTIC LAPAROSCOPY;  Surgeon: Johnnye Ade, MD;  Location: [facility];  Service: Gynecology;  Laterality: Bilateral;   LAPAROSCOPY Bilateral 01/16/2021   Procedure: EXPLORATORY LAPAROTOMY BILATERAL SALPINGO OOPHORECTOMY;  Surgeon: Johnnye Ade, MD;  Location: [facility];  Service: Gynecology;  Laterality: Bilateral;   TUBAL LIGATION     Social History   Occupational History    Not on file  Tobacco Use   Smoking status: Never   Smokeless tobacco: Never  Vaping Use   Vaping status: Never Used  Substance and Sexual Activity   Alcohol use: No   Drug use: Never   Sexual activity: Not Currently    Birth control/protection: Surgical

## 2024-05-26 NOTE — Progress Notes (Unsigned)
 Pain Scale   Average Pain 8 Patient advised she got little to no relief, injection helped for aprox. 1 week.        +Driver, -BT, -Dye Allergies.

## 2024-05-27 ENCOUNTER — Encounter: Payer: Self-pay | Admitting: Family Medicine

## 2024-05-27 ENCOUNTER — Encounter (INDEPENDENT_AMBULATORY_CARE_PROVIDER_SITE_OTHER): Payer: Self-pay | Admitting: Family Medicine

## 2024-05-27 ENCOUNTER — Telehealth: Payer: Self-pay

## 2024-05-27 ENCOUNTER — Encounter (INDEPENDENT_AMBULATORY_CARE_PROVIDER_SITE_OTHER): Payer: Self-pay

## 2024-05-27 NOTE — Progress Notes (Addendum)
 Helen [name] (Key: [facility])  PA Case ID #: [id number]  Need Help? Call us  at [phone]  Status  Sent to Plan today  Drug  Euflexxa 20MG /2ML syringes  Form  [employer] Medicare Electronic PA Form  [facility] has not replied to your PA request. Turnaround time for review of a PA request is dependent upon insurance plan and can range from 24 hours to 5 calendar days.   Note: the PA shows this:Statutory Exclusion [id number]  - this drug will not likely get approved as [employer] has not been covering HA injections.  Helen [name] (Key: [facility]) - [id number]  Euflexxa 20MG DAUNA syringes  status: PA Response - DeniedCreated: September 24th, 2025Sent: September 24th, 2025  Coverage for your drug was denied We've denied the drug listed below that you or your doctor requested: EUFLEXXA 1% 20 MG/2 ML SYRINGE Why was coverage denied? We denied the drug listed above because this drug is not covered under Medicare Part D by law. However, this drug may be covered under your part B benefit. We have started the review process under your part B benefit. This is not a medical necessity decision. It is a benefit issue only.

## 2024-05-27 NOTE — Telephone Encounter (Signed)
 [facility]  [facility]  [facility]  [address]  [place], [place] [address]  T: [phone] F: [phone]    Helen [name]   DOB: 1946-02-25   MRN: [id number]       [facility] Initial Intake Questionnaire  Date of Intake: May 27, 2024    Referring Physician: Leonel Ave PA  PCP: Lyle Laymon GAILS, FNP    PAIN BACKGROUND:  Where is your pain located? Lower back  When did your pain/symptoms start? 2-3 years   Was there a known injury or trauma? no  Do you have numbness/tingling or weakness? If yes, where? no  How would you describe your pain/symptoms? sharp  How severe is your pain on a scale of 0-10? 9  What makes your pain/symptoms WORSE? Bad weather/cold  What makes your pain/symptoms BETTER? nothing  Have you had recent imaging? Yes- MRI     If done outside of [place], please bring report and/or disc to your appt.  Have you been evaluated by any other specialties (NSGY, Ortho, Rheum, etc)? no    Please bring any records from outside of [place].      CURRENT/PAST TREATMENTS:  Medications (eg NSAIDS, Tylenol , Muscle Relaxants, Oral Steroids, Nerve Medication/Antidepressants): tylenol  (somewhat helpful), lidocaine  patch (helpful)     Physical Therapy (specifically for this pain concern):   Yes or No: yes  If yes, please provide details on response to therapy and how long completed: 2 sessions- not helpful      Injections (eg Epidural, Nerve Blocks, Radiofrequency Ablation, Joint Injection, Trigger Points, Stem Cells, PRP):   no    Spinal Surgeries:  no    OTHER:  Are you taking any blood thinners? no  Do you have a history of low platelets? no  Do you require interpretor services? no  Do you have any other concerns? no      Patient has been informed of appointment date, time, location and scheduled provider. Any and all questions were answered to the patient's satisfaction.

## 2024-06-03 ENCOUNTER — Ambulatory Visit: Payer: PRIVATE HEALTH INSURANCE | Attending: Internal Medicine | Admitting: Internal Medicine

## 2024-06-03 ENCOUNTER — Encounter: Payer: Self-pay | Admitting: Internal Medicine

## 2024-06-03 ENCOUNTER — Encounter (INDEPENDENT_AMBULATORY_CARE_PROVIDER_SITE_OTHER): Payer: Self-pay | Admitting: Family Medicine

## 2024-06-03 ENCOUNTER — Encounter (INDEPENDENT_AMBULATORY_CARE_PROVIDER_SITE_OTHER)

## 2024-06-03 ENCOUNTER — Encounter (INDEPENDENT_AMBULATORY_CARE_PROVIDER_SITE_OTHER): Payer: Self-pay

## 2024-06-03 VITALS — BP 131/76 | HR 66 | Temp 97.5°F | Resp 16 | Ht 67.0 in | Wt 169.0 lb

## 2024-06-03 DIAGNOSIS — N1831 Chronic kidney disease, stage 3a: Secondary | ICD-10-CM | POA: Diagnosis not present

## 2024-06-03 DIAGNOSIS — R7689 Other specified abnormal immunological findings in serum: Secondary | ICD-10-CM | POA: Diagnosis not present

## 2024-06-03 DIAGNOSIS — E038 Other specified hypothyroidism: Secondary | ICD-10-CM | POA: Insufficient documentation

## 2024-06-03 DIAGNOSIS — M542 Cervicalgia: Secondary | ICD-10-CM | POA: Diagnosis not present

## 2024-06-03 NOTE — Progress Notes (Signed)
 Office Visit Note  Patient: April Beck             Date of Birth: 1946/07/22           MRN: [id number]             PCP: Duanne Butler DASEN, MD Referring: Trudy Duwaine BRAVO, NP Visit Date: 06/03/2024 Occupation: Data Unavailable  Subjective:  New Patient (Initial Visit) (Referred by Ortho,severe arthritis in the neck and the right knee , considering knee surgery at some point. )   Discussed the use of AI scribe software for clinical note transcription with the patient, who gave verbal consent to proceed.  History of Present Illness   April Beck is a 78 year old female with multilevel degenerative disc disease who presents with neck pain and positive ANA test and imaging findings abnormal in C1-C2 vertebrae.  She has chronic neck pain due to multilevel degenerative disc disease with bone spurring and nerve impingement. The pain is primarily located on the right side of her neck and occasionally radiates down to her elbow, especially in the mornings. The pain has persisted for about two years and is often severe enough to limit her ability to turn her neck. She has received steroid injections, the most recent on the second of the month, which provided minimal relief. She also uses over-the-counter creams for pain management.  Recent imaging of her neck was performed, and the patient was told there was some concern for inflammation at the odontoid bone. Blood tests showed a positive ANA test, which the patient was told could be related to autoimmune disease. She has a history of taking prednisone , which did not significantly improve her symptoms. She is currently taking Tylenol  for pain management due to kidney disease, which limits her use of NSAIDs.  She has a history of hypothyroidism, managed with levothyroxine  for several years. There is no known family history of autoimmune diseases, but arthritis is prevalent in her family, particularly osteoarthritis, which she also  has in her fingers and knees.  She experiences swelling in her legs if she stands for extended periods. No numbness, significant radiation of pain, or swelling in other areas. No history of circulation problems, mouth ulcers, or swollen lymph nodes.       Labs reviewed 04/2024 ANA 1:320 homogenous dsDNA, Sm neg RF neg HLA-B27 neg ESR wnl CRP wnl  Imaging reviewed 04/03/24 MRI Cervical spine IMPRESSION: 1. 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 Severe chronic joint space loss demonstrated there on CT last year. Patchy acute marrow edema there and at the base of the odontoid on this exam. 2. 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 Mild degenerative anterolisthesis at both C3-C4 and C7-T1 associated with advanced facet degeneration. 3. Mild multifactorial spinal stenosis at C4-C5 and C5-C6. No spinal cord mass effect or signal abnormality. 4. Associated moderate, and occasionally severe, neural foraminal stenosis at the left C3, bilateral C4, C5, C6 (severe on the right), C7 (severe on the left) and left C8 (severe) nerve levels.   Activities of Daily Living:  Patient reports morning stiffness for 15-20 minutes.   Patient Reports nocturnal pain.  Difficulty dressing/grooming: Denies Difficulty climbing stairs: Reports Difficulty getting out of chair: Reports Difficulty using hands for taps, buttons, cutlery, and/or writing: Denies  Review of Systems  Constitutional:  Positive for fatigue.  HENT:  Negative for mouth sores and mouth dryness.   Eyes:  Positive for dryness.  Respiratory:  Negative for  shortness of breath.   Cardiovascular:  Negative for chest pain and palpitations.  Gastrointestinal:  Negative for blood in stool, constipation and diarrhea.  Endocrine: Negative for increased urination.  Genitourinary:  Negative for involuntary urination.  Musculoskeletal:  Positive for joint pain, gait problem, joint pain, myalgias, morning  stiffness and myalgias. Negative for joint swelling, muscle weakness and muscle tenderness.  Skin:  Negative for color change, rash, hair loss and sensitivity to sunlight.  Allergic/Immunologic: Negative for susceptible to infections.  Neurological:  Negative for dizziness and headaches.  Hematological:  Negative for swollen glands.  Psychiatric/Behavioral:  Negative for depressed mood and sleep disturbance. The patient is not nervous/anxious.     PMFS History:  Patient Active Problem List   Diagnosis Date Noted   Positive ANA (antinuclear antibody) 06/03/2024   Cervicalgia 06/03/2024   Cerumen impaction 09/16/2023   Acute sinusitis 09/16/2023   Suburethral sling present 04/25/2023   Arthritis of carpometacarpal [facility]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 01/16/2022   Ovarian cyst 01/16/2021   Chronic constipation 11/21/2020   CKD (chronic kidney disease) stage 3, GFR 30-59 ml/min (HCC) 11/21/2020   Herpes 11/21/2020   Hypokalemia 11/21/2020   Weakness 11/17/2020   Aspiration into airway    Fall    Dysphagia 06/14/2013   Atrophic vaginitis 06/14/2013   Hypercholesteremia    Hypertension    GERD (gastroesophageal reflux disease)    Hypothyroidism    Renal insufficiency 06/19/2012    Past Medical History:  Diagnosis Date   Anxiety    hx of panic attack   Arthritis 2013   hands & knees   CKD (chronic kidney disease), stage III (HCC)    followed by pcp   Closed fracture of left distal radius 10/31/2021   Edema of both lower extremities    GERD (gastroesophageal reflux disease) 2013   History of 2019 novel coronavirus disease (COVID-19) 09/17/2020   positive result in epic,  per pt very mild symtpoms that resolved   History of uterine fibroid    Hypertension 2023   followed by pcp   Hypomagnesemia    Hypothyroidism    followed by pcp   IDA (iron deficiency anemia)    Osteopenia    Ovarian cyst    Pelvic pain    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, takes  meloxicam    Wears contact lenses     Family History  Problem Relation Age of Onset   Alzheimer's disease Mother    Arthritis Father    Stroke Father    Diabetes Brother    Hypertension Brother    Colon polyps Brother    Heart disease Brother    Diabetes Brother    Hypertension Brother    Dementia Brother    Breast cancer Maternal Grandmother    Colon cancer Neg Hx    Esophageal cancer Neg Hx    Rectal cancer Neg Hx    Stomach cancer Neg Hx    Past Surgical History:  Procedure Laterality Date   ABDOMINAL HYSTERECTOMY  1982   CATARACT EXTRACTION Bilateral    CHOLECYSTECTOMY  06/20/2012   Procedure: LAPAROSCOPIC CHOLECYSTECTOMY;  Surgeon: Dann FORBES Hummer, MD;  Location: [facility] OR;  Service: General;  Laterality: N/A;   COLONOSCOPY  last one 03-20-2019  dr teressa   DIAGNOSTIC LAPAROSCOPY  yrs ago   EYE SURGERY  09/27/22   Cararact removal and lens implant   HIP ARTHROPLASTY Right 11/13/2020   Procedure: ARTHROPLASTY BIPOLAR HIP (HEMIARTHROPLASTY);  Surgeon:  Margrette Taft BRAVO, MD;  Location: [facility];  Service: Orthopedics;  Laterality: Right;   INCONTINENCE SURGERY  09-11-2005   dr watt  @[email or web address]    [facility]   JOINT REPLACEMENT  11/12/20   LAPAROSCOPIC SALPINGO OOPHERECTOMY Bilateral 01/16/2021   Procedure: DIAGNOSTIC LAPAROSCOPY;  Surgeon: Johnnye Ade, MD;  Location: [facility];  Service: Gynecology;  Laterality: Bilateral;   LAPAROSCOPY Bilateral 01/16/2021   Procedure: EXPLORATORY LAPAROTOMY BILATERAL SALPINGO OOPHORECTOMY;  Surgeon: Johnnye Ade, MD;  Location: [facility];  Service: Gynecology;  Laterality: Bilateral;   TUBAL LIGATION     Social History   Tobacco Use   Smoking status: Never    Passive exposure: Past   Smokeless tobacco: Never  Vaping Use   Vaping status: Never Used  Substance Use Topics   Alcohol use: No   Drug use: Never   Social History   Social History Narrative   Not on file     Immunization History   Administered Date(s) Administered   Fluad Quad(high Dose 65+) 05/21/2019, 06/19/2021, 06/15/2022   INFLUENZA, HIGH DOSE SEASONAL PF 06/20/2017, 07/11/2018, 06/29/2020   Influenza Split 06/20/2012, 06/29/2020   Influenza,inj,Quad PF,6+ Mos 06/01/2013, 05/13/2014, 06/27/2015   Influenza-Unspecified 06/18/2023   Moderna Sars-Covid-2 Vaccination 02/07/2024   PFIZER(Purple Top)SARS-COV-2 Vaccination 11/30/2019, 12/23/2019, 06/07/2020   Pfizer Covid-19 Vaccine Bivalent Booster 32yrs & up 07/04/2021   Pfizer(Comirnaty)Fall Seasonal Vaccine 12 years and older 06/20/2022, 06/18/2023   Pneumococcal Conjugate-13 10/23/2013   Pneumococcal Polysaccharide-23 06/20/2012   Respiratory Syncytial Virus Vaccine,Recomb Aduvanted(Arexvy) 06/27/2022   Tdap 06/20/2017   Zoster Recombinant(Shingrix ) 08/29/2017, 12/13/2017   Zoster, Live 08/04/2009     Objective: Vital Signs: BP 131/76 (BP Location: Right Arm, Patient Position: Sitting, Cuff Size: Normal)   Pulse 66   Temp (!) 97.5 F (36.4 C)   Resp 16   Ht 5' 7 (1.702 m)   Wt 169 lb (76.7 kg)   BMI 26.47 kg/m    Physical Exam Eyes:     Conjunctiva/sclera: Conjunctivae normal.  Cardiovascular:     Rate and Rhythm: Normal rate and regular rhythm.  Pulmonary:     Effort: Pulmonary effort is normal.     Breath sounds: Normal breath sounds.  Lymphadenopathy:     Cervical: No cervical adenopathy.  Skin:    General: Skin is warm and dry.     Comments: Varicose veins No pitting edema  Neurological:     Mental Status: She is alert.  Psychiatric:        Mood and Affect: Mood normal.      Musculoskeletal Exam:  Neck full ROM, some tenderness to pressure on muscle groups and with lateral rotation Shoulders full ROM no tenderness or swelling Tenderness to pressure at corner of scapula Elbows full ROM no tenderness or swelling Wrists full ROM no tenderness or swelling Fingers 1st CMC and MCP joint bony widening, no synovitis, DIP heberdon's  nodes Knees full ROM no tenderness or swelling, left patellofemoral crepitus Ankles full ROM no tenderness or swelling   Investigation: No additional findings.  Imaging: No results found.   Recent Labs: Lab Results  Component Value Date   WBC 7.5 04/03/2024   HGB 13.7 04/03/2024   PLT 251 04/03/2024   NA 140 01/02/2024   K 4.2 01/02/2024   CL 102 01/02/2024   CO2 30 01/02/2024   GLUCOSE 91 01/02/2024   BUN 16 01/02/2024   CREATININE 0.95 01/02/2024   BILITOT 0.7 01/02/2024   ALKPHOS 46 11/13/2020  AST 20 01/02/2024   ALT 13 01/02/2024   PROT 6.2 01/02/2024   ALBUMIN 3.5 11/13/2020   CALCIUM  9.5 01/02/2024   GFRAA 51 (L) 12/02/2020    Speciality Comments: No specialty comments available.  Procedures:  No procedures performed Allergies: Patient has no known allergies.   Assessment / Plan:     Visit Diagnoses: Positive ANA (antinuclear antibody)  Other specified hypothyroidism - Plan: Thyroid  Peroxidase Antibodies (TPO) (REFL) - Plan: Sedimentation rate, Cyclic citrul peptide antibody, IgG, C-reactive protein, RNP Antibody, Sjogrens syndrome-A extractable nuclear antibody, C3 and C4 Positive ANA test, a non-specific marker. Differential diagnosis includes autoimmune thyroid  disease, given her hypothyroidism and levothyroxine  use. Further evaluation needed to determine if ANA is incidental or indicative of an autoimmune process. - Order blood tests including CCP, autoimmune thyroid  disease antibodies, complements, sedimentation rate, and C-reactive protein. - Review blood test results next week to determine the need for further intervention or medication.   Stage 3a chronic kidney disease (HCC) Limiting for use of NSAID medication due to chronic kidney impairment.  Cervicalgia Cervical degenerative disc disease with radiculopathy and chronic neck pain Chronic neck pain with multilevel degenerative disc disease, bone spurring, and nerve impingement. MRI showed  odontoid bone inflammation, raising concerns for non-degenerative causes. - Agree with with scheduled neck injection on October 23. - Continue using over-the-counter topical creams for symptomatic relief. - Avoid NSAIDs due to kidney disease; continue using Tylenol  for pain management.  Polyosteoarthritis involving hands, knee, and cervical spine Osteoarthritis with hereditary predisposition affecting hands, knee, and cervical spine. Symptoms include joint pain, stiffness, and bony changes in the hands. Knee pain exacerbated by bending and stair climbing. - Continue calcium  plus vitamin D  supplementation. - Agree with plan consulting orthopedic surgeon regarding potential knee surgery.   Orders: Orders Placed This Encounter  Procedures   Sedimentation rate   Cyclic citrul peptide antibody, IgG   C-reactive protein   RNP Antibody   Sjogrens syndrome-A extractable nuclear antibody   C3 and C4   Thyroid  Peroxidase Antibodies (TPO) (REFL)   No orders of the defined types were placed in this encounter.    Follow-Up Instructions: Return if symptoms worsen or fail to improve.   Lonni LELON Ester, MD  Note - This record has been created using [employer].  Chart creation errors have been sought, but may not always  have been located. Such creation errors do not reflect on  the standard of medical care.

## 2024-06-04 ENCOUNTER — Telehealth: Payer: Self-pay

## 2024-06-04 ENCOUNTER — Other Ambulatory Visit (INDEPENDENT_AMBULATORY_CARE_PROVIDER_SITE_OTHER): Payer: Self-pay | Admitting: Family

## 2024-06-04 DIAGNOSIS — K219 Gastro-esophageal reflux disease without esophagitis: Secondary | ICD-10-CM

## 2024-06-04 MED ORDER — ESOMEPRAZOLE MAGNESIUM 40 MG PO CPDR: 40.0000 mg | DELAYED_RELEASE_CAPSULE | Freq: Every day | ORAL | 0 refills | Status: DC

## 2024-06-04 NOTE — Telephone Encounter (Signed)
 Called and spoke with pt about OTC Nexium alternative given by PCP. Pt stated that she cannot pay for OTC medication due to being low income. Pt stated that she does not have any money to pay for medications. Pt requested an alternative to be ordered and sent to her pharmacy.

## 2024-06-04 NOTE — Telephone Encounter (Signed)
 I sent the nexium as a prescription- not sure if she will get any coverage for it or not    Omeprazole (prilosec) is another option     Or famotidine (pepcid)    Technically, these are all OTC meds but insurance does sometimes cover some cost

## 2024-06-04 NOTE — Telephone Encounter (Signed)
 Spoke with pt about Nexium being sent into her pharmacy as a prescription. Pt verbally confirmed understanding.

## 2024-06-04 NOTE — Telephone Encounter (Signed)
 Copied from CRM #[id number]. Topic: Clinical Support - Medical Question  >> Jun 04, 2024 11:44 AM Nat MATSU wrote:  Helen [name] called about Clinical Support - Medical Question.  Additional details:    Pt would like to know if pcp could send in alternative medication for pantoprazole  (PROTONIX ) 40 MG tablet (Order [id number]) . Pt explained she has been taking this medication but feel as though it has not been effective for her. Pt would like to try something else that is covered by her insurance    Preferred pharmacy: [id number] - [place], [place] - [address]        Please advise and update pt, thank you

## 2024-06-04 NOTE — Telephone Encounter (Signed)
 Can try nexium instead

## 2024-06-05 ENCOUNTER — Ambulatory Visit (INDEPENDENT_AMBULATORY_CARE_PROVIDER_SITE_OTHER): Admitting: Family Medicine

## 2024-06-05 ENCOUNTER — Encounter (INDEPENDENT_AMBULATORY_CARE_PROVIDER_SITE_OTHER): Payer: Self-pay | Admitting: Family Medicine

## 2024-06-08 ENCOUNTER — Other Ambulatory Visit: Payer: Self-pay

## 2024-06-08 ENCOUNTER — Encounter: Payer: Self-pay | Admitting: Physician Assistant

## 2024-06-08 ENCOUNTER — Ambulatory Visit
Admission: RE | Admit: 2024-06-08 | Discharge: 2024-06-08 | Disposition: A | Discharge status: Home / Self Care | Source: Ambulatory Visit | Attending: Student in an Organized Health Care Education/Training Program | Admitting: Student in an Organized Health Care Education/Training Program

## 2024-06-08 VITALS — BP 145/73 | HR 98

## 2024-06-08 DIAGNOSIS — M545 Low back pain, unspecified: Secondary | ICD-10-CM | POA: Insufficient documentation

## 2024-06-08 DIAGNOSIS — G8929 Other chronic pain: Secondary | ICD-10-CM | POA: Insufficient documentation

## 2024-06-08 DIAGNOSIS — M5442 Lumbago with sciatica, left side: Secondary | ICD-10-CM | POA: Insufficient documentation

## 2024-06-08 DIAGNOSIS — M533 Sacrococcygeal disorders, not elsewhere classified: Secondary | ICD-10-CM | POA: Insufficient documentation

## 2024-06-08 LAB — SJOGRENS SYNDROME-A EXTRACTABLE NUCLEAR ANTIBODY: SSA (Ro) (ENA) Antibody, IgG: <1 AI

## 2024-06-08 LAB — C3 AND C4
C3 Complement: 137 mg/dL (ref 83–193)
C4 Complement: 33 mg/dL (ref 15–57)

## 2024-06-08 LAB — SEDIMENTATION RATE: Sed Rate: 2 mm/h (ref 0–30)

## 2024-06-08 LAB — C-REACTIVE PROTEIN: CRP: <3 mg/L (ref <8.0)

## 2024-06-08 LAB — CYCLIC CITRUL PEPTIDE ANTIBODY, IGG: Cyclic Citrullin Peptide Ab: <16 U

## 2024-06-08 LAB — THYROID PEROXIDASE ANTIBODIES (TPO) (REFL): Thyroperoxidase Ab SerPl-aCnc: <1 [IU]/mL (ref <9)

## 2024-06-08 LAB — RNP ANTIBODY: Ribonucleic Protein(ENA) Antibody, IgG: <1 AI

## 2024-06-08 NOTE — Progress Notes (Signed)
 [facility]  [facility]  [facility]  [address], [address]  [place], [place] [address]  T: [phone] F: [phone]    Helen [name]   DOB: 04/03/1946   MRN: [id number]   Date of Service: June 08, 2024      Referring Physician:  Chanetta Leonel HERO, PA  PCP: Lyle Laymon GAILS, FNP    Chief Complaint   Patient presents with    Back Pain     LBP  Pain Score: 9/10        Pain History:  Helen [name] is a 78 y.o. who presents today for initial evaluation and treatment of refractory low back pain. Her symptoms persist despite OTC medications and 2 courses of PT. She is referred to our office for further evaluation and treatment by neurosurgery Jacobo PA).     Onset: 2022/2023  Inciting event: unknown, does recall fall on ice around Christmas time  Course (worsening, improving, stable): worsening    Location: low back  Radiation: none  Quality: sharp  Aggravating factors: bad weather, cold  Alleviating factors: unknown  Assistive Device: cane occasionally   Physical Therapy: Completed x 2 - last in 2024, worsened symptoms   Imaging: CT lumbar spine 2022, MRI lumbar spine 04/2024     Anticoagulation: None  Bowel or Bladder Changes: None    Past Medications Tried and Response:   Tylenol   Lidocaine  patches    Past Pain Injections:   Knee injections - steroid and gel, relief    Past Spinal Surgeries:  None    Initial Intake Questionnaire   See telephone encounter 05/27/24     Medical History[1]    Past Surgical History[2]    Social History[3]    Allergies[4]    Current Medications[5]    Review of Systems   Constitutional: Negative.    HENT: Negative.     Eyes: Negative.    Respiratory: Negative.     Cardiovascular: Negative.    Gastrointestinal: Negative.    Musculoskeletal:  Positive for back pain.   Skin: Negative.    Neurological: Negative.  Negative for tingling and sensory change.   Endo/Heme/Allergies: Negative.    Psychiatric/Behavioral:  Negative.     All other systems reviewed and are negative.       PHYSICAL EXAM  Visit Vitals  BP 145/73 (BP Site: Right arm, Patient Position: Sitting, Cuff Size: Small)   Pulse 98   SpO2 99%        Pain Score: 9/10    Constitutional: Well developed, well nourished and in no apparent distress. Accompanied by daughter.   Psychiatric: Alert, awake, and oriented. Affect appropriate.  HEENT: atraumatic, mucous membranes moist  Resp: Normal respiratory excursion on RA.  Cardiovascular: warm and well perfused  Skin: Warm, dry, intact. No rashes on visible skin.   MSK/Neuro:   Palpation: Non tender over axial spine.   Range of motion:     Lumbar: reduced   Gait: Antalgic, no assistive device   Straight leg raise: negative. Reports knee pain with testing.   Sensory: normal to light touch throughout   Motor (R/L)    Hip Flexion 5/5    Knee Extension 5/5    Knee Flexion 5/5    Dorsiflexion 5/5    Plantarflexion 5/5    EHL 5/5  SIJ  SI Distraction R +, L +  FABER R +, L +  Thigh Thrust R +, L +  Fortin Finger  R +, L +      Pertinent Radiology Studies:   EXAM: MR LUMBAR SPINE WITHOUT CONTRAST 05/03/2024 ([facility])  INDICATION: Low back pain  COMPARISON: No relevant prior studies were available for review.  TECHNIQUE: Noncontrast imaging  FINDINGS:  At T10-T11 there is a 2 mm disc protrusion but no cord abutment or foraminal encroachment is noted.  T11-T12 demonstrates mild disc desiccation but no disc protrusion is noted.  T12-L1 and L1-L2 were unremarkable.  At L2-L3 there is disc desiccation with a central disc osteophyte complex. There is narrowing of the right subarticular recess but no definite findings for focal nerve root impingement. Foraminal encroachment is noted but again no unequivocal findings for foraminal nerve root impingement was noted.  At L3-L4 there is disc desiccation. This in conjunction with facet and ligamentous hypertrophy contribute to mild/moderate central canal stenosis. No focal nerve root impingement  was noted.  At L4-L5 there is disc desiccation with a 4 mm central disc protrusion. This in conjunction with facet and ligamentous hypertrophy contribute to severe acquired central stenosis and severe right subarticular recess stenosis. Additionally there is fluid in the right facet joint which can be a finding seen with facet instability. There is a right lateral disc osteophyte complex also abutting the exiting right L4 nerve root.  At L5-S1 there is 6 mm of anterolisthesis identified. Degenerative facet disease is evident. No focal central nerve root impingement was noted. There were findings for a left lateral disc osteophyte complex possibly abutting the exited left L5 nerve root.  The vertebral bodies demonstrate some type I Modic endplate changes at L4-L5 and to lesser extent at L2-L3. The bony pedicles facets were otherwise unremarkable. The distal cord and conus medullaris and cauda equina were within normal limits.  IMPRESSION:  1. Multilevel disc desiccation and degenerative facet disease. The findings were perhaps of greatest clinical significance at L4-L5 with there is severe acquired central stenosis and at L5-S1 where there is anterolisthesis noted. Please see the discussion above.    CT Lumbar Spine 10/12/20  Narrative & Impression   INDICATION: Sacroiliac pain.     TECHNIQUE: A spiral acquisition of the lumbosacral spine was obtained.  The data was reconstructed in the sagittal and coronal orientations. A  combination of automatic exposure control, adjustment of the mA and or  KV according to patient size, and/or use of iterative reconstruction  technique was utilized.     FINDINGS: There is accentuation of the normal lumbar lordosis. The  lumbar vertebral body heights are maintained. No fracture involving the  lumbar spine is demonstrated. There is a mild levoscoliosis.     Vacuum phenomenon involves the L2-3, L3-4, L4-5, and L5-S1  intervertebral disc.     At the L1-2 level, there is mild bilateral  ligamentous and facet  hypertrophy. No significant central canal stenosis is noted. There are  mild bilateral neural foraminal stenoses.     At the L2-3 level, there is a broad-based disc bulge. There is a mild to  moderate central canal stenosis. There are moderate bilateral neural  foraminal stenoses.     At the L3-4 level, there is a small disc bulge. There is a mild to  moderate central canal stenosis. There are moderate to severe bilateral  neural foraminal stenoses.     At the L4-5 level, there is grade 1 spondylolisthesis. There is a  broad-based disc bulge. There are severe bilateral ligamentous and facet  hypertrophy. A severe central canal stenosis is noted. There  are  moderate to severe bilateral neural foraminal stenoses. Vacuum  phenomenon involves the right L4-5 facet joint.     At the L5-S1 level, there is grade 1 spondylolisthesis. There is severe  bilateral facet hypertrophy. There is a mild central canal stenosis.  There are moderate to severe bilateral neural foraminal stenoses.     Vacuum phenomenon involves the sacroiliac joints. There are prominent  sclerotic changes involving the adjacent cortical surfaces particularly  on the iliac sides. No erosive changes or bony fusion of the sacroiliac  joints is noted. Differential diagnosis includes osteoarthritis and  sacroiliitis. If further evaluation is indicated, MRI with and without  contrast would be most helpful.     IMPRESSION:    Degenerative changes involving the lumbosacral spine. Severe  central canal stenosis at the L4-5 level. Moderate to severe neural  foraminal stenoses at the L4-5 and L5-S1 levels.     Prominent sclerotic changes involving the adjacent cortical surfaces of  the sacroiliac joints. Differential diagnosis includes osteoarthritis  and sacroiliitis. If further evaluation is indicated, MRI with and  without contrast would be most helpful.       DIAGNOSIS:  1. Chronic bilateral low back pain without sciatica    2. Sacroiliac  joint pain          Prescriptions:   Orders Placed This Encounter    Ambulatory referral to Pain Clinic    Sacroiliac Joint Injection        PLAN:  I have personally performed the history, ROS, physical exam and plan today.  Continue current medication regimen.  Continue PT/HEP.  Continue follow up with orthopedics for knee pain.  Follow up once insurance authorization obtained for bilateral sacroiliac joint injections.  Informed patient of risks, benefits and details of procedure. All questions answered.  Diabetic -- A1c 7.2  Recommend she monitor blood glucose levels after steroid injections.   No blood thinners, antibiotics or recent infections.       Rollene MICAEL Ree, PA-C    Please pardon any potential grammatical errors or typos as aspects of this note may have been created through speech-to-text software.     DATA REVIEWED:  In preparation for today's visit, I have reviewed the patient's most recent office notes with Neurosurgery and Orthopedics. I have also independently reviewed any listed radiographic studies. Today's visit did not require independent historian.          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    Bilateral cataracts     Breast cancer (CMS/HCC)     Breast lump     Cancer (CMS/HCC)     breast cancer    Carcinoma (CMS/HCC)     Carcinoma of the bladder    Chicken pox     Childhood illness    Conduction Disorder     RBBB    Diabetes mellitus type II     Non-Insulin  dependent    Echocardiogram 10/2013    Gastroesophageal reflux disease     Heart murmur     Hemorrhoids without complication     Holter monitor 02/2014  Hyperlipidemia     Hypertensive disorder     Malignant neoplasm of breast (CMS/HCC)     Measles     Childhood illness    Mumps     Childhood illness     Myocardial perfusion scan 11/2013, 12/2013    MPI single Isotope excrise; MPI dual Isotope Lexiscan     Rash     RBBB     Spinal stenosis     Stage 2 chronic kidney disease 10/29/2023    Syncope    [2]   Past Surgical History:  Procedure Laterality Date    ABLATION OF DYSRHYTHMIC FOCUS      ARTHROSCOPY, KNEE Left 04/21/2015    Procedure: ARTHROSCOPY, KNEE PARTIAL MEDIAL AND LATERAL MENISECTOMY LEFT KNEE;  Surgeon: Evie Shlomo CROME, MD;  Location: [facility];  Service: Orthopedics;  Laterality: Left;  partial medial menisectomy    BREAST BIOPSY      COLONOSCOPY, SCREENING N/A 12/07/2022    Procedure: COLONOSCOPY, SCREENING;  Surgeon: Vernia Mabel HERO, MD;  Location: [facility];  Service: Gastroenterology;  Laterality: N/A;    EGD N/A 12/07/2022    Procedure: EGD;  Surgeon: Vernia Mabel HERO, MD;  Location: [facility];  Service: Gastroenterology;  Laterality: N/A;    HAND SURGERY      HYSTERECTOMY      total    KNEE SURGERY      TUMOR REMOVAL Left     neck  benign    [3]   Social History  Tobacco Use    Smoking status: Never     Passive exposure: Never    Smokeless tobacco: Never   Vaping Use    Vaping status: Never Used   Substance Use Topics    Alcohol use: No    Drug use: Never   [4]   Allergies  Allergen Reactions    Aspirin      Break out in sweat and dizziness     Other Environmental     Peppers     Pollen Extract     Latex Itching and Rash   [5]   Current Outpatient Medications   Medication Sig Dispense Refill    albuterol  (PROVENTIL ) (2.5 MG/3ML) 0.083% nebulizer solution TAKE 3 MLS (2.5 MG TOTAL) BY NEBULIZATION EVERY 4 (FOUR) HOURS AS NEEDED FOR SHORTNESS OF BREATH OR WHEEZING 75 mL 1    albuterol  sulfate HFA (PROVENTIL ) 108 (90 Base) MCG/ACT inhaler INHALE 2 PUFFS INTO THE LUNGS EVERY 4 HOURS AS NEEDED FOR WHEEZING OR SHORTNESS OF BREATH. 6.7 each 2    amLODIPine  (NORVASC ) 10 MG tablet TAKE 1 TABLET (10 MG) BY MOUTH DAILY. 90 tablet 3    atorvastatin  (LIPITOR) 40 MG tablet TAKE 1 TABLET BY MOUTH EVERY DAY 90  tablet 3    Azelastine  HCl 137 MCG/SPRAY Solution 2 SPRAYS BY NASAL ROUTE 2 (TWO) TIMES DAILY USE IN EACH NOSTRIL AS DIRECTED 30 mL 1    Blood Glucose Monitoring Suppl (ONE TOUCH ULTRA 2) W/DEVICE KIT One daily 1 each 0    cetirizine (ZyrTEC) 10 MG tablet Take 1 tablet (10 mg) by mouth once daily      Cholecalciferol (Vitamin D3) 2000 UNIT capsule TAKE 1 CAPSULE BY MOUTH EVERY DAY 90 capsule 1    dexAMETHasone (DECADRON) 0.1 % ophthalmic solution Place 1 drop into both eyes      EPINEPHrine  (EPIPEN  2-PAK) 0.3 MG/0.3ML Solution Auto-injector injection Inject 0.3 mLs (0.3 mg) into the muscle once as needed (Anaphylaxis) 1 each 3  EPINEPHrine  0.3 MG/0.3ML auto-injector 1 DOSE IF NEEDED FOR ANAPHYLAXIS (Patient taking differently: once)      esomeprazole (NexIUM) 40 MG capsule Take 1 capsule (40 mg) by mouth once daily 90 capsule 0    ferrous sulfate 325 (65 FE) MG tablet Take 1 tablet (325 mg) by mouth every morning with breakfast      fluticasone  (FLONASE ) 50 MCG/ACT nasal spray 2 sprays by Nasal route daily      fluticasone  (FLOVENT  HFA) 220 MCG/ACT inhaler INHALE 1 PUFF INTO THE LUNGS TWICE A DAY 24 each 2    glipiZIDE  (GLUCOTROL ) 10 MG tablet Take 2 tablets (20 mg) by mouth 2 (two) times daily before meals 360 tablet 1    Glucagon , rDNA, (Glucagon  Emergency) 1 MG Kit Inject 1 mg into the skin once as needed (severe hypoglycemia) 1 kit 1    hydrocortisone  (Anusol -HC) 25 MG suppository Place 1 suppository (25 mg) rectally 2 (two) times daily 14 suppository 1    Jardiance  25 MG tablet TAKE 1 TABLET BY MOUTH EVERY MORNING 90 tablet 1    Lancets (onetouch ultrasoft) lancets Use to check blood sugars 2 times daily 200 each 3    lidocaine  (Lidoderm ) 5 % Place 1 patch onto the skin once daily as needed (pain) Remove & Discard patch within 12 hours 30 patch 1    linaGLIPtin  (Tradjenta ) 5 MG Tablet TAKE 1 TABLET BY MOUTH EVERY DAY 90 tablet 0    metFORMIN  (GLUCOPHAGE -XR) 500 MG 24 hr tablet TAKE 2 TABLETS (1,000 MG) BY  MOUTH TWICE A DAY 360 tablet 1    montelukast (SINGULAIR) 10 MG tablet Take 1 tablet (10 mg) by mouth daily      NASONEX 50 MCG/ACT nasal spray 2 sprays by Nasal route daily as needed  5    OneTouch Ultra test strip TWICE A DAY 200 strip 3    valsartan -hydroCHLOROthiazide  (DIOVAN -HCT) 160-12.5 MG per tablet TAKE 1 TABLET BY MOUTH EVERY DAY 90 tablet 0     No current facility-administered medications for this encounter.

## 2024-06-09 ENCOUNTER — Other Ambulatory Visit (INDEPENDENT_AMBULATORY_CARE_PROVIDER_SITE_OTHER): Payer: Self-pay | Admitting: Family

## 2024-06-09 ENCOUNTER — Ambulatory Visit (INDEPENDENT_AMBULATORY_CARE_PROVIDER_SITE_OTHER): Admitting: Family Medicine

## 2024-06-09 ENCOUNTER — Encounter (INDEPENDENT_AMBULATORY_CARE_PROVIDER_SITE_OTHER): Payer: Self-pay | Admitting: Family Medicine

## 2024-06-09 VITALS — BP 128/70 | HR 70

## 2024-06-09 DIAGNOSIS — M170 Bilateral primary osteoarthritis of knee: Secondary | ICD-10-CM

## 2024-06-09 DIAGNOSIS — E119 Type 2 diabetes mellitus without complications: Secondary | ICD-10-CM

## 2024-06-09 DIAGNOSIS — G8929 Other chronic pain: Secondary | ICD-10-CM

## 2024-06-09 DIAGNOSIS — M545 Low back pain, unspecified: Secondary | ICD-10-CM

## 2024-06-09 MED ORDER — MELOXICAM 7.5 MG PO TABS: 7.5000 mg | ORAL_TABLET | Freq: Every day | ORAL | 0 refills | Status: DC | PRN

## 2024-06-09 MED ORDER — TRIAMCINOLONE ACETONIDE 40 MG/ML IJ SUSP
40.0000 mg | Freq: Once | INTRAMUSCULAR | Status: AC
Start: 2024-06-09 — End: 2024-06-09
  Administered 2024-06-09: 40 mg via INTRA_ARTICULAR

## 2024-06-09 NOTE — Progress Notes (Signed)
 Date of Exam: 06/09/2024 5:15 PM      Patient ID: Helen [name] is a 78 y.o. female.  Attending Physician: Toribio KATHEE Berkley PONCE, DO      Chief Complaint:   Chief Complaint   Patient presents with    Knee Pain     BL knee pain f/u            HPI:   History of Present Illness  Helen [name] is a 78 year old female who presents with persistent left knee pain following a cortisone injection.    She experiences persistent left knee pain following a cortisone injection on April 10, 2024, which initially provided relief for about three weeks. The pain has since returned and remains persistent. She has a history of receiving cortisone injections for knee pain, which have been effective in providing temporary relief. Currently, she experiences pain in both knees. She is able to take anti-inflammatory medications.        ROS:   As per HPI.  Otherwise as below.  Review of Systems        Problem List:   Problem List[1]     Current Meds:   Medications Taking[2]      Allergies:   Allergies[3]     Past Surgical History:   Past Surgical History[4]       Family History:   Family History[5]       Social History:   Social History[6]       The following sections were reviewed this encounter by the provider:   Tobacco  Allergies  Meds  Problems  Med Hx  Surg Hx  Fam Hx              Vital Signs:   BP 128/70   Pulse 70   SpO2 98%           Physical Exam:   Physical Exam  Vitals and nursing note reviewed.   Constitutional:       General: She is not in acute distress.     Appearance: Normal appearance. She is not ill-appearing.   HENT:      Head: Normocephalic and atraumatic.      Right Ear: External ear normal.      Left Ear: External ear normal.      Nose: Nose normal. No congestion.      Mouth/Throat:      Mouth: Mucous membranes are moist.   Eyes:      Pupils: Pupils are equal, round, and reactive to light.   Musculoskeletal:      Comments: Examination of the knee reveals full AROM with crepitus.  No effusion.  Medial  and lateral joint line tenderness present   Neurological:      General: No focal deficit present.      Mental Status: She is alert and oriented to person, place, and time. Mental status is at baseline.   Psychiatric:         Mood and Affect: Mood normal.            Physical Exam            Procedure(s):   Arthrocentesis    Date/Time: 06/09/2024 5:14 PM    Performed by: Berkley Toribio KATHEE PONCE, DO  Authorized by: Berkley Toribio KATHEE PONCE, DO  Consent: Verbal consent obtained  Risks and benefits: risks, benefits and alternatives were discussed  Consent given by: patient  Patient understanding: patient states understanding of the procedure  being performed  Patient consent: the patient's understanding of the procedure matches consent given  Site marked: the operative site was marked  Patient identity confirmed: verbally with patient  Indications: pain   Body area: knee  Joint: right knee  Preparation: Patient was prepped and draped in the usual sterile fashion.  Needle size: 22 G  Approach: lateral  Triamcinolone  amount: 40 mg  Lidocaine  1% amount: 3 mL  Patient tolerance: patient tolerated the procedure well with no immediate complications      Arthrocentesis    Date/Time: 06/09/2024 5:14 PM    Performed by: Berkley Toribio KATHEE PONCE, DO  Authorized by: Berkley Toribio KATHEE PONCE, DO  Consent: Verbal consent obtained  Risks and benefits: risks, benefits and alternatives were discussed  Consent given by: patient  Patient understanding: patient states understanding of the procedure being performed  Patient consent: the patient's understanding of the procedure matches consent given  Site marked: the operative site was marked  Patient identity confirmed: verbally with patient  Indications: pain   Body area: knee  Joint: left knee  Preparation: Patient was prepped and draped in the usual sterile fashion.  Needle size: 22 G  Approach: lateral  Triamcinolone  amount: 40 mg  Lidocaine  1% amount: 3 mL  Patient tolerance: patient tolerated the procedure  well with no immediate complications              Radiology:   Results              Assessment:   1. Primary osteoarthritis of both knees  - triamcinolone  acetonide (KENALOG -40) 40 MG/ML injection 40 mg  - triamcinolone  acetonide (KENALOG -40) 40 MG/ML injection 40 mg  - Interventional Pain Management Referral: Vernice Blew, MD ([facility] [place]); Future    2. Chronic pain of left knee  - triamcinolone  acetonide (KENALOG -40) 40 MG/ML injection 40 mg  - triamcinolone  acetonide (KENALOG -40) 40 MG/ML injection 40 mg  - Interventional Pain Management Referral: Vernice Blew, MD ([facility] [place]); Future    3. Chronic right-sided low back pain without sciatica  - triamcinolone  acetonide (KENALOG -40) 40 MG/ML injection 40 mg  - triamcinolone  acetonide (KENALOG -40) 40 MG/ML injection 40 mg  - Interventional Pain Management Referral: Vernice Blew, MD ([facility] - ); Future           Plan:   1. Primary osteoarthritis of both knees    2. Chronic pain of left knee    3. Chronic right-sided low back pain without sciatica         Assessment & Plan  Bilateral knee osteoarthritis with pain  Chronic bilateral knee osteoarthritis with persistent pain. Previous cortisone injection in the left knee provided temporary relief. Insurance issues prevent gel shots at current facility. Discussed knee replacement as definitive treatment, but she prefers to avoid surgery and try gel shots first.    - Administer cortisone injections in both knees  - Refer to outside facility for HA injections due to her insurance  - Prescribe mobic  7.5mg  as needed.  - Discuss potential for knee replacement if gel shots do not provide sufficient relief.          Follow-up:   No follow-ups on file.          Toribio KATHEE Berkley II, DO              [1]   Patient Active Problem List  Diagnosis    Hypertension    Hyperlipidemia  H/O total hysterectomy with removal of both tubes and ovaries    Heart  block AV first degree    RBBB (right bundle branch block)    Palpitations    Pure hypercholesterolemia, unspecified    Nonspecific abnormal electrocardiogram (ECG) (EKG)    Type 2 diabetes mellitus without complication, without long-term current use of insulin  (CMS/HCC)    Anemia    History of colonic polyps    Gastroesophageal reflux disease, unspecified whether esophagitis present    Osteopenia after menopause    Stage 2 chronic kidney disease   [2]   Outpatient Medications Marked as Taking for the 06/09/24 encounter (Office Visit) with Berkley Toribio KATHEE PONCE, DO   Medication Sig Dispense Refill    albuterol  (PROVENTIL ) (2.5 MG/3ML) 0.083% nebulizer solution TAKE 3 MLS (2.5 MG TOTAL) BY NEBULIZATION EVERY 4 (FOUR) HOURS AS NEEDED FOR SHORTNESS OF BREATH OR WHEEZING 75 mL 1    albuterol  sulfate HFA (PROVENTIL ) 108 (90 Base) MCG/ACT inhaler INHALE 2 PUFFS INTO THE LUNGS EVERY 4 HOURS AS NEEDED FOR WHEEZING OR SHORTNESS OF BREATH. 6.7 each 2    amLODIPine  (NORVASC ) 10 MG tablet TAKE 1 TABLET (10 MG) BY MOUTH DAILY. 90 tablet 3    atorvastatin  (LIPITOR) 40 MG tablet TAKE 1 TABLET BY MOUTH EVERY DAY 90 tablet 3    Azelastine  HCl 137 MCG/SPRAY Solution 2 SPRAYS BY NASAL ROUTE 2 (TWO) TIMES DAILY USE IN EACH NOSTRIL AS DIRECTED 30 mL 1    Blood Glucose Monitoring Suppl (ONE TOUCH ULTRA 2) W/DEVICE KIT One daily 1 each 0    cetirizine (ZyrTEC) 10 MG tablet Take 1 tablet (10 mg) by mouth once daily      Cholecalciferol (Vitamin D3) 2000 UNIT capsule TAKE 1 CAPSULE BY MOUTH EVERY DAY 90 capsule 1    dexAMETHasone (DECADRON) 0.1 % ophthalmic solution Place 1 drop into both eyes      EPINEPHrine  (EPIPEN  2-PAK) 0.3 MG/0.3ML Solution Auto-injector injection Inject 0.3 mLs (0.3 mg) into the muscle once as needed (Anaphylaxis) 1 each 3    EPINEPHrine  0.3 MG/0.3ML auto-injector 1 DOSE IF NEEDED FOR ANAPHYLAXIS (Patient taking differently: once)      esomeprazole (NexIUM) 40 MG capsule Take 1 capsule (40 mg) by mouth once daily 90  capsule 0    ferrous sulfate 325 (65 FE) MG tablet Take 1 tablet (325 mg) by mouth every morning with breakfast      fluticasone  (FLONASE ) 50 MCG/ACT nasal spray 2 sprays by Nasal route daily      fluticasone  (FLOVENT  HFA) 220 MCG/ACT inhaler INHALE 1 PUFF INTO THE LUNGS TWICE A DAY 24 each 2    Glucagon , rDNA, (Glucagon  Emergency) 1 MG Kit Inject 1 mg into the skin once as needed (severe hypoglycemia) 1 kit 1    hydrocortisone  (Anusol -HC) 25 MG suppository Place 1 suppository (25 mg) rectally 2 (two) times daily 14 suppository 1    Jardiance  25 MG tablet TAKE 1 TABLET BY MOUTH EVERY MORNING 90 tablet 1    Lancets (onetouch ultrasoft) lancets Use to check blood sugars 2 times daily 200 each 3    lidocaine  (Lidoderm ) 5 % Place 1 patch onto the skin once daily as needed (pain) Remove & Discard patch within 12 hours 30 patch 1    linaGLIPtin  (Tradjenta ) 5 MG Tablet TAKE 1 TABLET BY MOUTH EVERY DAY 90 tablet 0    metFORMIN  (GLUCOPHAGE -XR) 500 MG 24 hr tablet TAKE 2 TABLETS (1,000 MG) BY MOUTH TWICE A DAY 360 tablet  1    montelukast (SINGULAIR) 10 MG tablet Take 1 tablet (10 mg) by mouth daily      NASONEX 50 MCG/ACT nasal spray 2 sprays by Nasal route daily as needed  5    OneTouch Ultra test strip TWICE A DAY 200 strip 3    valsartan -hydroCHLOROthiazide  (DIOVAN -HCT) 160-12.5 MG per tablet TAKE 1 TABLET BY MOUTH EVERY DAY 90 tablet 0     Current Facility-Administered Medications for the 06/09/24 encounter (Office Visit) with Berkley Toribio KATHEE PONCE, DO   Medication Dose Route Frequency Provider Last Rate Last Admin    [COMPLETED] triamcinolone  acetonide (KENALOG -40) 40 MG/ML injection 40 mg  40 mg Intra-articular Once Keymon Mcelroy B II, DO   40 mg at 06/09/24 1410    [COMPLETED] triamcinolone  acetonide (KENALOG -40) 40 MG/ML injection 40 mg  40 mg Intra-articular Once Jamesrobert Ohanesian B II, DO   40 mg at 06/09/24 1409   [3]   Allergies  Allergen Reactions    Aspirin      Break out in sweat and dizziness     Other  Environmental     Peppers     Pollen Extract     Latex Itching and Rash   [4]   Past Surgical History:  Procedure Laterality Date    ABLATION OF DYSRHYTHMIC FOCUS      ARTHROSCOPY, KNEE Left 04/21/2015    Procedure: ARTHROSCOPY, KNEE PARTIAL MEDIAL AND LATERAL MENISECTOMY LEFT KNEE;  Surgeon: Evie Shlomo CROME, MD;  Location: [facility];  Service: Orthopedics;  Laterality: Left;  partial medial menisectomy    BREAST BIOPSY      COLONOSCOPY, SCREENING N/A 12/07/2022    Procedure: COLONOSCOPY, SCREENING;  Surgeon: Vernia Mabel HERO, MD;  Location: [facility];  Service: Gastroenterology;  Laterality: N/A;    EGD N/A 12/07/2022    Procedure: EGD;  Surgeon: Vernia Mabel HERO, MD;  Location: [facility];  Service: Gastroenterology;  Laterality: N/A;    HAND SURGERY      HYSTERECTOMY      total    KNEE SURGERY      TUMOR REMOVAL Left     neck  benign    [5]   Family History  Problem Relation Name Age of Onset    Diabetes Mother Jarelyn Bambach     Diabetes Sister Lincey Clyburn     Diabetes Sister Reena Oris     Malignant hyperthermia Neg Hx      Pseudochol deficiency Neg Hx      Anesthesia problems Neg Hx     [6]   Social History  Tobacco Use    Smoking status: Never     Passive exposure: Never    Smokeless tobacco: Never   Vaping Use    Vaping status: Never Used   Substance Use Topics    Alcohol use: No    Drug use: Never

## 2024-06-11 DIAGNOSIS — Z1283 Encounter for screening for malignant neoplasm of skin: Secondary | ICD-10-CM | POA: Diagnosis not present

## 2024-06-11 DIAGNOSIS — D225 Melanocytic nevi of trunk: Secondary | ICD-10-CM | POA: Diagnosis not present

## 2024-06-11 DIAGNOSIS — L820 Inflamed seborrheic keratosis: Secondary | ICD-10-CM | POA: Diagnosis not present

## 2024-06-18 ENCOUNTER — Ambulatory Visit (INDEPENDENT_AMBULATORY_CARE_PROVIDER_SITE_OTHER): Admitting: Residents

## 2024-06-22 ENCOUNTER — Other Ambulatory Visit: Payer: Self-pay | Admitting: Family Medicine

## 2024-06-22 DIAGNOSIS — Z6827 Body mass index (BMI) 27.0-27.9, adult: Secondary | ICD-10-CM | POA: Diagnosis not present

## 2024-06-22 DIAGNOSIS — Z01419 Encounter for gynecological examination (general) (routine) without abnormal findings: Secondary | ICD-10-CM | POA: Diagnosis not present

## 2024-06-22 DIAGNOSIS — N1831 Chronic kidney disease, stage 3a: Secondary | ICD-10-CM

## 2024-06-22 DIAGNOSIS — Z1231 Encounter for screening mammogram for malignant neoplasm of breast: Secondary | ICD-10-CM | POA: Diagnosis not present

## 2024-06-22 DIAGNOSIS — I10 Essential (primary) hypertension: Secondary | ICD-10-CM

## 2024-06-22 LAB — HM MAMMOGRAPHY

## 2024-06-23 ENCOUNTER — Encounter: Payer: Self-pay | Admitting: Family Medicine

## 2024-06-23 ENCOUNTER — Telehealth: Payer: Self-pay | Admitting: *Deleted

## 2024-06-23 NOTE — Telephone Encounter (Signed)
 Patient contact the office regarding her lab results. Please review and advise.

## 2024-06-24 ENCOUNTER — Ambulatory Visit: Payer: Self-pay | Admitting: Internal Medicine

## 2024-06-24 NOTE — Telephone Encounter (Signed)
 Now addressed in associated result note

## 2024-06-24 NOTE — Progress Notes (Signed)
 Lab results all came back negative or reassuring.  Her sedimentation rate CRP and complements were normal so did not indicate active inflammation.  The additional antibody tests for rheumatoid arthritis lupus or Sjogren syndrome were negative. Her test for the antithyroid antibodies were also negative so no specific association between the ANA result and her hypothyroidism. Based on these results and our clinic exam I do not see any major red flags that would indicate an active autoimmune disease at this time.  I would agree with her to continue following up in the orthopedics clinic since she has significant degenerative arthritis in multiple areas.

## 2024-06-25 ENCOUNTER — Other Ambulatory Visit: Payer: Self-pay

## 2024-06-25 ENCOUNTER — Ambulatory Visit (INDEPENDENT_AMBULATORY_CARE_PROVIDER_SITE_OTHER): Payer: PRIVATE HEALTH INSURANCE | Admitting: Physical Medicine and Rehabilitation

## 2024-06-25 VITALS — BP 172/91 | HR 78

## 2024-06-25 DIAGNOSIS — M47812 Spondylosis without myelopathy or radiculopathy, cervical region: Secondary | ICD-10-CM

## 2024-06-25 MED ORDER — METHYLPREDNISOLONE ACETATE 40 MG/ML IJ SUSP
40.0000 mg | Freq: Once | INTRAMUSCULAR | Status: AC
  Administered 2024-06-25: 40 mg

## 2024-06-25 NOTE — Progress Notes (Signed)
 Pain Scale   Average Pain 7 Patient advising she has chronic neck pain that increases when she turns her head.         +Driver, -BT, -Dye Allergies.

## 2024-06-29 ENCOUNTER — Other Ambulatory Visit: Payer: Self-pay | Admitting: Family Medicine

## 2024-06-29 ENCOUNTER — Encounter: Payer: Self-pay | Admitting: Family Medicine

## 2024-06-29 NOTE — Procedures (Signed)
 Cervical Facet Joint Intra-Articular Injection with Fluoroscopic Guidance  Patient: April Beck      Date of Birth: 09-Jun-1946 MRN: [id number] PCP: Duanne Butler DASEN, MD      Visit Date: 06/25/2024   Universal Protocol:    Date/Time: 10/27/258:34 PM  Consent Given By: the patient  Position: lateral  Additional Comments: Vital signs were monitored before and after the procedure. Patient was prepped and draped in the usual sterile fashion. The correct patient, procedure, and site was verified.   Injection Procedure Details:  Procedure Site One Meds Administered:  Meds ordered this encounter  Medications   methylPREDNISolone  acetate (DEPO-MEDROL ) injection 40 mg     Laterality: Right  Location/Site:  C2-3  Needle size: 25 G  Needle type: spinal needle  Needle Placement: Articular  Findings:  -Contrast Used: 0.5 mL iohexol 180 mg iodine /mL   -Comments: Excellent flow of contrast producing a partial arthrogram.  Procedure Details: The fluoroscope beam was manipulated to achieve the best "true" lateral view possible by squaring off the endplates with cranial and caudal tilt and using varying obliquity to achieve the a view with the longest length of spinous process.   The region overlying the facet joints mentioned above were then localized under fluoroscopic visualization. For each target described below the skin was anesthetized with 1 ml of 1% Lidocaine  without epinephrine . The needle was inserted down to the level of the lateral mass of the superior articular process of the  facet joint to be injected. Then, the needle was walked off inferiorly into the lateral aspect of the facet joint. Bi-planar images were used for confirming placement and spot radiographs were documented. Radiographs were obtained of the arthrogram. A 0.5 ml. volume of the steroid/anesthetic solution was injected into the joint. This procedure was repeated for each facet joint  injected.   Additional Comments:  The patient tolerated the procedure well Dressing: Band-Aid    Post-procedure details: Patient was observed during the procedure. Post-procedure instructions were reviewed.  Patient left the clinic in stable condition.

## 2024-06-29 NOTE — Progress Notes (Signed)
 April Beck - 78 y.o. female MRN [id number]  Date of birth: 1946-01-03  Office Visit Note: Visit Date: 06/25/2024 PCP: Duanne Butler DASEN, MD Referred by: Duanne Butler DASEN, MD  Subjective: Chief Complaint  Patient presents with   Neck - Pain   HPI:  April Beck is a 78 y.o. female who comes in today at the request of Duwaine Pouch, FNP for planned Right  C2-3 Cervical facet/medial branch block with fluoroscopic guidance.  The patient has failed conservative care including home exercise, medications, time and activity modification.  This injection will be diagnostic and hopefully therapeutic.  Please see requesting physician notes for further details and justification.  Exam has shown concordant pain with facet joint loading.   ROS Otherwise per HPI.  Assessment & Plan: Visit Diagnoses:    ICD-10-CM   1. Cervical spondylosis without myelopathy  M47.812 XR C-ARM NO REPORT    Facet Injection    methylPREDNISolone  acetate (DEPO-MEDROL ) injection 40 mg      Plan: No additional findings.   Meds & Orders:  Meds ordered this encounter  Medications   methylPREDNISolone  acetate (DEPO-MEDROL ) injection 40 mg    Orders Placed This Encounter  Procedures   Facet Injection   XR C-ARM NO REPORT    Follow-up: Return for visit to requesting provider as needed.   Procedures: No procedures performed  Cervical Facet Joint Intra-Articular Injection with Fluoroscopic Guidance  Patient: April Beck      Date of Birth: March 06, 1946 MRN: [id number] PCP: Duanne Butler DASEN, MD      Visit Date: 06/25/2024   Universal Protocol:    Date/Time: 10/27/258:34 PM  Consent Given By: the patient  Position: lateral  Additional Comments: Vital signs were monitored before and after the procedure. Patient was prepped and draped in the usual sterile fashion. The correct patient, procedure, and site was verified.   Injection Procedure Details:  Procedure Site One Meds  Administered:  Meds ordered this encounter  Medications   methylPREDNISolone  acetate (DEPO-MEDROL ) injection 40 mg     Laterality: Right  Location/Site:  C2-3  Needle size: 25 G  Needle type: spinal needle  Needle Placement: Articular  Findings:  -Contrast Used: 0.5 mL iohexol 180 mg iodine /mL   -Comments: Excellent flow of contrast producing a partial arthrogram.  Procedure Details: The fluoroscope beam was manipulated to achieve the best "true" lateral view possible by squaring off the endplates with cranial and caudal tilt and using varying obliquity to achieve the a view with the longest length of spinous process.   The region overlying the facet joints mentioned above were then localized under fluoroscopic visualization. For each target described below the skin was anesthetized with 1 ml of 1% Lidocaine  without epinephrine . The needle was inserted down to the level of the lateral mass of the superior articular process of the  facet joint to be injected. Then, the needle was walked off inferiorly into the lateral aspect of the facet joint. Bi-planar images were used for confirming placement and spot radiographs were documented. Radiographs were obtained of the arthrogram. A 0.5 ml. volume of the steroid/anesthetic solution was injected into the joint. This procedure was repeated for each facet joint injected.   Additional Comments:  The patient tolerated the procedure well Dressing: Band-Aid    Post-procedure details: Patient was observed during the procedure. Post-procedure instructions were reviewed.  Patient left the clinic in stable condition.    Clinical History: CLINICAL DATA:  This study identified as missing a  report at 1038 hours on 04/03/2024.   78 year old female with persistent neck pain.   EXAM: MRI CERVICAL SPINE WITHOUT CONTRAST   TECHNIQUE: Multiplanar, multisequence MR imaging of the cervical spine was performed. No intravenous contrast was  administered.   COMPARISON:  Cervical spine CT 10/28/2022.   FINDINGS: Alignment: Chronic straightening of cervical lordosis stable from last year. No significant scoliosis. Subtle chronic anterolisthesis of C3 on C4. Similar mild anterolisthesis of C7 on T1.   Vertebrae: Normal background bone marrow signal. Chronic degenerative endplate osteophytosis and marrow signal changes. Faint degenerative endplate marrow edema asymmetric to the left at C6-C7. Similar patchy degenerative appearing marrow edema at the base of the odontoid (series 107, image 8). And more confluent degenerative appearing marrow edema in the right C1 ring, subjacent C2 articular surface (series 107, images 3 and 4). Severe right side C1-C2 joint space loss and subchondral sclerosis demonstrated by CT last year. Maintained vertebral height.   Cord: Normal.   Posterior Fossa, vertebral arteries, paraspinal tissues: Cervicomedullary junction is within normal limits. Negative visible posterior fossa. Preserved bilateral major vascular flow voids in the neck.   Negative visible neck soft tissues and lung apices.   Disc levels:   C1-C2: Severe asymmetric right side joint space loss and degeneration as seen on CT last year (series 6, image 24 of that exam).   C2-C3: Mild disc bulging. Mild to moderate left facet and ligament flavum hypertrophy. Mild to moderate left C3 foraminal stenosis.   C3-C4: Disc space loss. Chronic anterolisthesis. Circumferential disc bulge with endplate spurring. Moderate facet hypertrophy greater on the left. Effaced ventral CSF space but no significant spinal stenosis (series 106, image 8). Moderate left greater than right C4 foraminal stenosis.   C4-C5: Disc space loss. Circumferential disc osteophyte complex with a broad-based posterior component. Mild posterior element hypertrophy. Mild spinal stenosis. No cord mass effect. Moderate left greater than right C5 foraminal  stenosis.   C5-C6: Disc space loss. Circumferential disc osteophyte complex. Mild to moderate facet and ligament flavum hypertrophy. Mild spinal stenosis. No cord mass effect. Moderate to severe right, moderate left C6 foraminal stenosis.   C6-C7: Disc space loss. Circumferential disc osteophyte complex. Mild to moderate posterior element hypertrophy. No significant spinal stenosis. Moderate to severe left, moderate right C7 foraminal stenosis.   C7-T1: Chronic mild anterolisthesis. Mild disc bulging. Moderate to severe facet hypertrophy greater on the left. Moderate ligament flavum hypertrophy. No spinal stenosis. Moderate to severe left C8 neural foraminal stenosis.   Visible upper thoracic spine disc degeneration but fairly capacious visible upper thoracic spinal canal, no visible upper thoracic spinal stenosis (series 106, image 9).   IMPRESSION: 1. 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 Severe chronic joint space loss demonstrated there on CT last year. Patchy acute marrow edema there and at the base of the odontoid on this exam. 2. 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 Mild degenerative anterolisthesis at both C3-C4 and C7-T1 associated with advanced facet degeneration. 3. Mild multifactorial spinal stenosis at C4-C5 and C5-C6. No spinal cord mass effect or signal abnormality. 4. Associated moderate, and occasionally severe, neural foraminal stenosis at the left C3, bilateral C4, C5, C6 (severe on the right), C7 (severe on the left) and left C8 (severe) nerve levels.     Electronically Signed   By: VEAR Hurst M.D.   On: 04/03/2024 11:10     Objective:  VS:  HT:    WT:   BMI:     BP:(!)  172/91  HR:78bpm  TEMP: ( )  RESP:  Physical Exam Vitals and nursing note reviewed.  Constitutional:      General: She is not in acute distress.    Appearance: Normal appearance. She is not ill-appearing.  HENT:     Head: Normocephalic  and atraumatic.     Right Ear: External ear normal.     Left Ear: External ear normal.  Eyes:     Extraocular Movements: Extraocular movements intact.  Cardiovascular:     Rate and Rhythm: Normal rate.     Pulses: Normal pulses.  Musculoskeletal:     Cervical back: Tenderness present. No rigidity.     Right lower leg: No edema.     Left lower leg: No edema.     Comments: Patient has good strength in the upper extremities including 5 out of 5 strength in wrist extension long finger flexion and APB.  There is no atrophy of the hands intrinsically.  There is a negative Hoffmann's test.   Lymphadenopathy:     Cervical: No cervical adenopathy.  Skin:    Findings: No erythema, lesion or rash.  Neurological:     General: No focal deficit present.     Mental Status: She is alert and oriented to person, place, and time.     Sensory: No sensory deficit.     Motor: No weakness or abnormal muscle tone.     Coordination: Coordination normal.  Psychiatric:        Mood and Affect: Mood normal.        Behavior: Behavior normal.      Imaging: No results found.

## 2024-07-02 ENCOUNTER — Encounter (INDEPENDENT_AMBULATORY_CARE_PROVIDER_SITE_OTHER): Payer: Self-pay | Admitting: Family

## 2024-07-03 ENCOUNTER — Encounter: Payer: Self-pay | Admitting: Physical Medicine and Rehabilitation

## 2024-07-06 ENCOUNTER — Ambulatory Visit: Payer: PRIVATE HEALTH INSURANCE | Admitting: Family Medicine

## 2024-07-06 ENCOUNTER — Encounter: Payer: Self-pay | Admitting: Radiology

## 2024-07-06 ENCOUNTER — Encounter: Payer: Self-pay | Admitting: Family Medicine

## 2024-07-06 VITALS — BP 130/82 | HR 84 | Temp 97.9°F | Ht 67.0 in | Wt 164.8 lb

## 2024-07-06 DIAGNOSIS — N1831 Chronic kidney disease, stage 3a: Secondary | ICD-10-CM

## 2024-07-06 DIAGNOSIS — I10 Essential (primary) hypertension: Secondary | ICD-10-CM | POA: Diagnosis not present

## 2024-07-06 DIAGNOSIS — E038 Other specified hypothyroidism: Secondary | ICD-10-CM

## 2024-07-06 NOTE — Progress Notes (Signed)
 Subjective:    Patient ID: April Beck, female    DOB: 1945/12/02, 78 y.o.   MRN: [id number]  Had her mammogram in 10/25.  Patient is here today for her regular checkup.  Her blood pressure is good at 130/82.  She denies any chest pain.  She is dealing with chronic neck pain and left knee pain secondary to osteoarthritis.  She has received her second cortisone injection in her neck and that has helped her neck pain.  She is trying to avoid NSAIDs because of her history of stage III chronic kidney disease.  She is using topical Voltaren and having some relief with that.  She appears to be due for a bone density test.  All of her immunizations are up-to-date.  Past Medical History:  Diagnosis Date   Anxiety    hx of panic attack   Arthritis 2013   hands & knees   CKD (chronic kidney disease), stage III (HCC)    followed by pcp   Closed fracture of left distal radius 10/31/2021   Edema of both lower extremities    GERD (gastroesophageal reflux disease) 2013   History of 2019 novel coronavirus disease (COVID-19) 09/17/2020   positive result in epic,  per pt very mild symtpoms that resolved   History of uterine fibroid    Hypertension 2023   followed by pcp   Hypomagnesemia    Hypothyroidism    followed by pcp   IDA (iron deficiency anemia)    Osteopenia    Ovarian cyst    Pelvic pain    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, takes meloxicam    Wears contact lenses    Past Surgical History:  Procedure Laterality Date   ABDOMINAL HYSTERECTOMY  1982   CATARACT EXTRACTION Bilateral    CHOLECYSTECTOMY  06/20/2012   Procedure: LAPAROSCOPIC CHOLECYSTECTOMY;  Surgeon: Dann FORBES Hummer, MD;  Location: [facility];  Service: General;  Laterality: N/A;   COLONOSCOPY  last one 03-20-2019  dr teressa   DIAGNOSTIC LAPAROSCOPY  yrs ago   EYE SURGERY  09/27/22   Cararact removal and lens implant   HIP ARTHROPLASTY Right 11/13/2020   Procedure: ARTHROPLASTY BIPOLAR HIP  (HEMIARTHROPLASTY);  Surgeon: Margrette Taft FORBES, MD;  Location: [facility];  Service: Orthopedics;  Laterality: Right;   INCONTINENCE SURGERY  09-11-2005   dr watt  @[email or web address]    [facility]   JOINT REPLACEMENT  11/12/20   LAPAROSCOPIC SALPINGO OOPHERECTOMY Bilateral 01/16/2021   Procedure: DIAGNOSTIC LAPAROSCOPY;  Surgeon: Johnnye Ade, MD;  Location: [facility];  Service: Gynecology;  Laterality: Bilateral;   LAPAROSCOPY Bilateral 01/16/2021   Procedure: EXPLORATORY LAPAROTOMY BILATERAL SALPINGO OOPHORECTOMY;  Surgeon: Johnnye Ade, MD;  Location: [facility];  Service: Gynecology;  Laterality: Bilateral;   TUBAL LIGATION     Current Outpatient Medications on File Prior to Visit  Medication Sig Dispense Refill   acetaminophen  (TYLENOL ) 325 MG tablet Take 2 tablets (650 mg total) by mouth every 6 (six) hours as needed for pain.     acyclovir  (ZOVIRAX ) 400 MG tablet TAKE 1 TABLET EVERY DAY 90 tablet 3   atorvastatin  (LIPITOR) 40 MG tablet TAKE 1 TABLET EVERY DAY 90 tablet 3   calcium -vitamin D  (OSCAL WITH D) 250-125 MG-UNIT tablet Take 1 tablet by mouth daily.     ciclopirox  (PENLAC ) 8 % solution Apply topically at bedtime. Apply over nail and surrounding skin. Apply daily over previous coat. After seven (7) days, may  remove with alcohol and continue cycle. 6.6 mL 2   citalopram  (CELEXA ) 10 MG tablet TAKE 1 TABLET EVERY DAY 90 tablet 3   clobetasol (TEMOVATE) 0.05 % external solution Apply topically 2 (two) times daily as needed.     diphenoxylate -atropine  (LOMOTIL ) 2.5-0.025 MG tablet TAKE 2 TABLETS BY MOUTH 4 (FOUR) TIMES DAILY AS NEEDED FOR DIARRHEA OR LOOSE STOOLS. 30 tablet 0   estradiol  (ESTRACE  VAGINAL) 0.1 MG/GM vaginal cream Place 1 Applicatorful vaginally at bedtime. 42.5 g 3   fluconazole  (DIFLUCAN ) 150 MG tablet Take 1 tablet (150 mg total) by mouth daily. (Patient taking differently: Take 150 mg by mouth as needed.) 2 tablet 0   fluocinonide gel  (LIDEX) 0.05 % Apply 1 application topically 3 (three) times daily as needed (rash).     fluticasone  (CUTIVATE ) 0.05 % cream Apply topically 2 (two) times daily as needed.     levothyroxine  (SYNTHROID ) 50 MCG tablet TAKE 1 TABLET EVERY DAY 90 tablet 3   losartan  (COZAAR ) 50 MG tablet TAKE 1 TABLET EVERY DAY 90 tablet 3   meclizine  (ANTIVERT ) 25 MG tablet Take 1 tablet (25 mg total) by mouth 3 (three) times daily as needed. 30 tablet 0   pantoprazole  (PROTONIX ) 40 MG tablet TAKE 1 TABLET TWICE DAILY 180 tablet 3   Tavaborole  (KERYDIN ) 5 % SOLN Apply 1 drop topically daily. Apply 1 drop to the toenail daily. 10 mL 2   tretinoin (RETIN-A) 0.025 % cream Apply topically at bedtime.     triamcinolone  cream (KENALOG ) 0.1 % APPLY TO AFFECTED AREA TWICE A DAY     No current facility-administered medications on file prior to visit.   No Known Allergies Social History   Socioeconomic History   Marital status: Divorced    Spouse name: Not on file   Number of children: Not on file   Years of education: Not on file   Highest education level: 12th grade  Occupational History   Not on file  Tobacco Use   Smoking status: Never    Passive exposure: Past   Smokeless tobacco: Never  Vaping Use   Vaping status: Never Used  Substance and Sexual Activity   Alcohol use: No   Drug use: Never   Sexual activity: Not Currently    Birth control/protection: Surgical  Other Topics Concern   Not on file  Social History Narrative   Not on file   Social Drivers of Health   Financial Resource Strain: Low Risk  (07/06/2024)   Overall Financial Resource Strain (CARDIA)    Difficulty of Paying Living Expenses: Not very hard  Recent Concern: Financial Resource Strain - Medium Risk (04/15/2024)   Overall Financial Resource Strain (CARDIA)    Difficulty of Paying Living Expenses: Somewhat hard  Food Insecurity: No Food Insecurity (07/06/2024)   Hunger Vital Sign    Worried About Running Out of Food in the Last  Year: Never true    Ran Out of Food in the Last Year: Never true  Transportation Needs: No Transportation Needs (07/06/2024)   PRAPARE - Administrator, Civil Service (Medical): No    Lack of Transportation (Non-Medical): No  Physical Activity: Inactive (04/15/2024)   Exercise Vital Sign    Days of Exercise per Week: 0 days    Minutes of Exercise per Session: 0 min  Stress: No Stress Concern Present (07/06/2024)   [employer] of Occupational Health - Occupational Stress Questionnaire    Feeling of Stress: Not at all  Social  Connections: Moderately Integrated (07/06/2024)   Social Connection and Isolation Panel    Frequency of Communication with Friends and Family: Three times a week    Frequency of Social Gatherings with Friends and Family: Three times a week    Attends Religious Services: More than 4 times per year    Active Member of Clubs or Organizations: Yes    Attends Banker Meetings: Patient declined    Marital Status: Divorced  Intimate Partner Violence: Not At Risk (04/15/2024)   Humiliation, Afraid, Rape, and Kick questionnaire    Fear of Current or Ex-Partner: No    Emotionally Abused: No    Physically Abused: No    Sexually Abused: No       Review of Systems  All other systems reviewed and are negative.      Objective:   Physical Exam Vitals reviewed.  Constitutional:      General: She is not in acute distress.    Appearance: She is well-developed. She is not diaphoretic.  HENT:     Head: Normocephalic and atraumatic.     Right Ear: External ear normal.     Left Ear: External ear normal.     Nose: Nose normal.     Mouth/Throat:     Pharynx: No oropharyngeal exudate.  Eyes:     General: No scleral icterus.       Right eye: No discharge.        Left eye: No discharge.     Conjunctiva/sclera: Conjunctivae normal.     Pupils: Pupils are equal, round, and reactive to light.  Neck:     Thyroid : No thyromegaly.     Vascular: No  JVD.     Trachea: No tracheal deviation.  Cardiovascular:     Rate and Rhythm: Normal rate and regular rhythm.     Heart sounds: Normal heart sounds. No murmur heard.    No friction rub. No gallop.  Pulmonary:     Effort: Pulmonary effort is normal. No respiratory distress.     Breath sounds: Normal breath sounds. No stridor. No wheezing or rales.  Chest:     Chest wall: No tenderness.  Abdominal:     General: Bowel sounds are normal. There is no distension.     Palpations: Abdomen is soft. There is no mass.     Tenderness: There is no abdominal tenderness. There is no guarding or rebound.  Musculoskeletal:        General: No tenderness or deformity.     Cervical back: Normal range of motion and neck supple.  Lymphadenopathy:     Cervical: No cervical adenopathy.  Skin:    General: Skin is warm.     Coloration: Skin is not pale.     Findings: No erythema or rash.  Neurological:     Mental Status: She is alert and oriented to person, place, and time.     Cranial Nerves: No cranial nerve deficit.     Motor: No abnormal muscle tone.     Coordination: Coordination normal.     Deep Tendon Reflexes: Reflexes are normal and symmetric. Reflexes normal.  Psychiatric:        Behavior: Behavior normal.        Thought Content: Thought content normal.        Judgment: Judgment normal.           Assessment & Plan:  Stage 3a chronic kidney disease (HCC) - Plan: CBC with Differential/Platelet, Comprehensive metabolic panel with  GFR, Protein / Creatinine Ratio, Urine  Benign essential HTN - Plan: CBC with Differential/Platelet, Comprehensive metabolic panel with GFR, Protein / Creatinine Ratio, Urine  Other specified hypothyroidism - Plan: CBC with Differential/Platelet, Comprehensive metabolic panel with GFR, TSH Blood pressure looks well-controlled.  Check urine protein creatinine ratio.  If elevated consider increasing dose of losartan  versus adding Jardiance.  Monitor CBC and  thyroid  as well.  Recommended bone density test.  Patient will check with her gynecologist about this.

## 2024-07-07 ENCOUNTER — Ambulatory Visit: Payer: Self-pay | Admitting: Family Medicine

## 2024-07-07 ENCOUNTER — Other Ambulatory Visit (INDEPENDENT_AMBULATORY_CARE_PROVIDER_SITE_OTHER): Payer: Self-pay | Admitting: Family Medicine

## 2024-07-07 LAB — CBC WITH DIFFERENTIAL/PLATELET
Absolute Lymphocytes: 2376 {cells}/uL (ref 850–3900)
Absolute Monocytes: 541 {cells}/uL (ref 200–950)
Basophils Absolute: 33 {cells}/uL (ref 0–200)
Basophils Relative: 0.5 %
Eosinophils Absolute: 119 {cells}/uL (ref 15–500)
Eosinophils Relative: 1.8 %
HCT: 41.4 % (ref 35.0–45.0)
Hemoglobin: 13.8 g/dL (ref 11.7–15.5)
MCH: 31.7 pg (ref 27.0–33.0)
MCHC: 33.3 g/dL (ref 32.0–36.0)
MCV: 95.2 fL (ref 80.0–100.0)
MPV: 9.8 fL (ref 7.5–12.5)
Monocytes Relative: 8.2 %
Neutro Abs: 3531 {cells}/uL (ref 1500–7800)
Neutrophils Relative %: 53.5 %
Platelets: 231 Thousand/uL (ref 140–400)
RBC: 4.35 Million/uL (ref 3.80–5.10)
RDW: 11 % (ref 11.0–15.0)
Total Lymphocyte: 36 %
WBC: 6.6 Thousand/uL (ref 3.8–10.8)

## 2024-07-07 LAB — PROTEIN / CREATININE RATIO, URINE
Creatinine, Urine: 211 mg/dL (ref 20–275)
Protein/Creat Ratio: 100 mg/g{creat} (ref 24–184)
Protein/Creatinine Ratio: 0.1 mg/mg{creat} (ref 0.024–0.184)
Total Protein, Urine: 21 mg/dL (ref 5–24)

## 2024-07-07 LAB — COMPREHENSIVE METABOLIC PANEL WITH GFR
AG Ratio: 2 (calc) (ref 1.0–2.5)
ALT: 23 U/L (ref 6–29)
AST: 26 U/L (ref 10–35)
Albumin: 4.5 g/dL (ref 3.6–5.1)
Alkaline phosphatase (APISO): 59 U/L (ref 37–153)
BUN: 22 mg/dL (ref 7–25)
CO2: 30 mmol/L (ref 20–32)
Calcium: 9.5 mg/dL (ref 8.6–10.4)
Chloride: 102 mmol/L (ref 98–110)
Creat: 0.97 mg/dL (ref 0.60–1.00)
Globulin: 2.3 g/dL (ref 1.9–3.7)
Glucose, Bld: 98 mg/dL (ref 65–99)
Potassium: 4.7 mmol/L (ref 3.5–5.3)
Sodium: 139 mmol/L (ref 135–146)
Total Bilirubin: 0.7 mg/dL (ref 0.2–1.2)
Total Protein: 6.8 g/dL (ref 6.1–8.1)
eGFR: 60 mL/min/1.73m2 (ref >=60)

## 2024-07-07 LAB — TSH: TSH: 1.31 m[IU]/L (ref 0.40–4.50)

## 2024-07-13 ENCOUNTER — Ambulatory Visit (INDEPENDENT_AMBULATORY_CARE_PROVIDER_SITE_OTHER): Payer: Self-pay

## 2024-07-13 DIAGNOSIS — E119 Type 2 diabetes mellitus without complications: Secondary | ICD-10-CM

## 2024-07-13 DIAGNOSIS — I10 Essential (primary) hypertension: Secondary | ICD-10-CM

## 2024-07-20 ENCOUNTER — Ambulatory Visit (INDEPENDENT_AMBULATORY_CARE_PROVIDER_SITE_OTHER): Admitting: Residents

## 2024-07-20 ENCOUNTER — Encounter (INDEPENDENT_AMBULATORY_CARE_PROVIDER_SITE_OTHER): Payer: Self-pay | Admitting: Residents

## 2024-07-20 VITALS — BP 112/82 | HR 86 | Ht 61.0 in | Wt 154.0 lb

## 2024-07-20 DIAGNOSIS — E7800 Pure hypercholesterolemia, unspecified: Secondary | ICD-10-CM

## 2024-07-20 DIAGNOSIS — I4510 Unspecified right bundle-branch block: Secondary | ICD-10-CM

## 2024-07-20 DIAGNOSIS — I10 Essential (primary) hypertension: Secondary | ICD-10-CM

## 2024-07-20 DIAGNOSIS — I440 Atrioventricular block, first degree: Secondary | ICD-10-CM

## 2024-07-20 LAB — ECG 12-LEAD
Atrial Rate: 86 {beats}/min
IHS MUSE NARRATIVE AND IMPRESSION: NORMAL
P Axis: 44 degrees
P-R Interval: 176 ms
Q-T Interval: 406 ms
QRS Duration: 146 ms
QTC Calculation (Bezet): 485 ms
R Axis: -33 degrees
T Axis: 7 degrees
Ventricular Rate: 86 {beats}/min

## 2024-07-20 NOTE — Progress Notes (Signed)
 [place]  HEART CARDIOLOGY OFFICE PROGRESS NOTE    [facility]  [place]  HEART [place] OFFICE -CARDIOLOGY  [id number]  [place] [address]  Dept: [phone]  Dept Fax: [phone]       Patient Name: Helen [name], Helen [name]    Date of Visit:  July 20, 2024  Date of Birth: 05-02-1946  AGE: 78 y.o.  Medical Record #: [id number]  Requesting Physician: Laymon LULLA Alert, FNP      CHIEF COMPLAINT: Annual Exam      HISTORY OF PRESENT ILLNESS:    78 year old female with past medical history of hypertension, hyperlipidemia, diabetes, previous bladder and breast cancer and right bundle branch block coming in for follow-up.  Patient continues to do well from a cardiac standpoint.  No new chest pain or shortness of breath.  Blood pressure is well-managed.  Most recent cholesterol shows an LDL 36 on statin therapy.  A1c is around 7.0.  No exertional chest pain or shortness of breath.  Unfortunately she is dealing with many orthopedic issues in her knees and back.  Patient denies fever, night sweats or chills.      PAST MEDICAL HISTORY: She has a past medical history of Anemia, Arthritis, Arthritis, Asthma, Asthma without status asthmaticus, Atrioventricular conduction disorder, Bilateral cataracts, Breast cancer (CMS/HCC), Breast lump, Cancer (CMS/HCC), Carcinoma (CMS/HCC), Chicken pox, Conduction Disorder, Diabetes mellitus type II, Echocardiogram (10/2013), Gastroesophageal reflux disease, Heart murmur, Hemorrhoids without complication, Holter monitor (02/2014), Hyperlipidemia, Hypertensive disorder, Malignant neoplasm of breast (CMS/HCC), Measles, Mumps, Myocardial perfusion scan (11/2013, 12/2013), Rash, RBBB, Spinal stenosis, 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 She has a past surgical history that includes Breast biopsy; Hysterectomy; Tumor removal (Left); ARTHROSCOPY, KNEE (Left, 04/21/2015); Ablation of dysrhythmic focus; EGD (N/A, 12/07/2022); COLONOSCOPY,  SCREENING (N/A, 12/07/2022); Hand surgery; and Knee surgery.    Allergies  Reviewed by Malvina Quintin DEL, DO on 07/20/2024        Severity Reactions Comments    Aspirin Not Specified  Break out in sweat and dizziness     Other Environmental Not Specified      Peppers Not Specified      Pollen Extract Not Specified      Latex Low Itching, Rash              MEDICATIONS:   Patient's current medications were reviewed. ONLY Cardiac medications were updated unless others were addressed in assessment and plan.    Current Outpatient Medications (Relevant to Cardiology)   Medication Sig    EPINEPHrine  Inject 0.3 mLs (0.3 mg) into the muscle once as needed (Anaphylaxis)    Jardiance  TAKE 1 TABLET BY MOUTH EVERY MORNING    valsartan -hydroCHLOROthiazide  TAKE 1 TABLET BY MOUTH EVERY DAY    amLODIPine  TAKE 1 TABLET (10 MG) BY MOUTH DAILY.    atorvastatin  TAKE 1 TABLET BY MOUTH EVERY DAY     Current Outpatient Medications (Other)   Medication Sig    OneTouch Ultra 2 One daily    montelukast Take 1 tablet (10 mg) by mouth daily    Nasonex 2 sprays by Nasal route daily as needed    fluticasone  2 sprays by Nasal route daily    ferrous sulfate Take 1 tablet (325 mg) by mouth every morning with breakfast    dexAMETHasone Place 1 drop into both eyes    albuterol  sulfate HFA INHALE 2 PUFFS INTO THE LUNGS EVERY 4 HOURS AS NEEDED FOR WHEEZING OR SHORTNESS OF BREATH.    albuterol  TAKE 3  MLS (2.5 MG TOTAL) BY NEBULIZATION EVERY 4 (FOUR) HOURS AS NEEDED FOR SHORTNESS OF BREATH OR WHEEZING    glipiZIDE  Take 2 tablets (20 mg) by mouth 2 (two) times daily before meals    onetouch ultrasoft Use to check blood sugars 2 times daily    fluticasone  INHALE 1 PUFF INTO THE LUNGS TWICE A DAY    Glucagon  Emergency Inject 1 mg into the skin once as needed (severe hypoglycemia)    OneTouch Ultra TWICE A DAY    cetirizine Take 1 tablet (10 mg) by mouth once daily    Vitamin D3 TAKE 1 CAPSULE BY MOUTH EVERY DAY    lidocaine  Place 1 patch onto the skin  once daily as needed (pain) Remove & Discard patch within 12 hours    hydrocortisone  Place 1 suppository (25 mg) rectally 2 (two) times daily    Azelastine  HCl 2 SPRAYS BY NASAL ROUTE 2 (TWO) TIMES DAILY USE IN EACH NOSTRIL AS DIRECTED    Tradjenta  TAKE 1 TABLET BY MOUTH EVERY DAY    esomeprazole  Take 1 capsule (40 mg) by mouth once daily    meloxicam  Take 1 tablet (7.5 mg) by mouth once daily as needed for Pain    metFORMIN  TAKE 2 TABLETS (1,000 MG) BY MOUTH TWICE A DAY       FAMILY HISTORY: family history includes Diabetes in her mother, sister, and sister.    SOCIAL HISTORY: She reports that she has never smoked. She has never been exposed to tobacco smoke. She has never used smokeless tobacco. She reports that she does not drink alcohol and does not use drugs.    PHYSICAL EXAMINATION    Visit Vitals  BP 112/82 (BP Site: Left arm, Patient Position: Sitting, Cuff Size: Medium)   Pulse 86   Ht 1.549 m (5' 1)   Wt 69.9 kg (154 lb)   BMI 29.10 kg/m       Constitutional: Cooperative, alert, no acute distress.  Neck: No carotid bruits, JVP normal.  Cardiac: Regular rate and rhythm, normal S1 and S2; no S3 or S4. No murmurs. No rubs, no gallops.  Pulmonary: Clear to auscultation bilaterally, no wheezing, no rhonchi, no rales.  Extremities: no edema.  Vascular: +2 pulses in radial artery bilaterally, 2+ pedal pulses bilaterally.    ECG: Sinus rhythm, right bundle branch block      LABS REVIEWED:   Lab Results   Component Value Date    WBC 5.8 02/10/2024    HGB 12.6 02/10/2024    HCT 42.0 02/10/2024    PLT 246 02/10/2024     Lab Results   Component Value Date    GLU 128 (H) 02/10/2024    BUN 19 02/10/2024    CREAT 0.92 02/10/2024    NA 139 02/10/2024    K 4.3 02/10/2024    CL 101 02/10/2024    CO2 21 02/10/2024    AST 24 10/28/2023    ALT 18 10/28/2023     Lab Results   Component Value Date    TSH 0.975 10/28/2023    HGBA1C 7.2 (A) 05/21/2024     Lab Results   Component Value Date    CHOL 137 03/23/2024    TRIG 60  03/23/2024    HDL 88 03/23/2024    LDL 36 03/23/2024     No results found for: LPACHOL        Labs reviewed      IMPRESSION:   Ms. Weigel is a 78 y.o.  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  Patient is doing great, no new exertional symptoms.  Blood pressure and cholesterol are well-controlled, continue current medications.  Continue good diet and exercise for elevated BMI.  Can follow-up in 1 year, or sooner if new symptoms arise.                                                     Orders Placed This Encounter   Procedures    ECG 12 lead (Normal)    Office Visit ([facility] [place])       No orders of the defined types were placed in this encounter.      SIGNED:    Quintin DEL Jehu Mccauslin, DO      Please pardon any potential grammatical or typographical errors as aspects of this note may have been created through speech-to-text software.

## 2024-07-24 ENCOUNTER — Other Ambulatory Visit (INDEPENDENT_AMBULATORY_CARE_PROVIDER_SITE_OTHER): Payer: Self-pay | Admitting: Family

## 2024-07-24 ENCOUNTER — Telehealth: Payer: Self-pay

## 2024-07-24 DIAGNOSIS — K648 Other hemorrhoids: Secondary | ICD-10-CM

## 2024-07-24 NOTE — Telephone Encounter (Signed)
 Pt called for appt. Related Pt was seen on Monday by cardiologist. Pt hurt her back 3 weeks ago and for the past few days she noticed a feeling that she felt like was palpitations but turns into a burning feeling in her chest that causes mild nausea. Pt was told by cardio her heart was good and was not her heart. Wants to be seen today.  No appt available today. Advised UC/ER for evaluation.  Pt agreed.

## 2024-07-24 NOTE — Telephone Encounter (Signed)
 LVM

## 2024-07-25 ENCOUNTER — Emergency Department
Admission: EM | Admit: 2024-07-25 | Discharge: 2024-07-25 | Disposition: A | Discharge status: Home / Self Care | Attending: Emergency Medicine | Admitting: Emergency Medicine

## 2024-07-25 DIAGNOSIS — N182 Chronic kidney disease, stage 2 (mild): Secondary | ICD-10-CM | POA: Insufficient documentation

## 2024-07-25 DIAGNOSIS — M545 Low back pain, unspecified: Secondary | ICD-10-CM | POA: Insufficient documentation

## 2024-07-25 DIAGNOSIS — I129 Hypertensive chronic kidney disease with stage 1 through stage 4 chronic kidney disease, or unspecified chronic kidney disease: Secondary | ICD-10-CM | POA: Insufficient documentation

## 2024-07-25 MED ORDER — ONDANSETRON 4 MG PO TBDP: 4.0000 mg | ORAL_TABLET | Freq: Four times a day (QID) | ORAL | 0 refills | Status: AC | PRN

## 2024-07-25 MED ORDER — DIAZEPAM 5 MG PO TABS: 5.0000 mg | ORAL_TABLET | Freq: Four times a day (QID) | ORAL | 0 refills | Status: DC | PRN

## 2024-07-25 MED ORDER — DIAZEPAM 5 MG PO TABS
5.0000 mg | ORAL_TABLET | Freq: Once | ORAL | Status: AC
Start: 2024-07-25 — End: 2024-07-25
  Administered 2024-07-25: 5 mg via ORAL
  Filled 2024-07-25: qty 1

## 2024-07-25 MED ORDER — LIDOCAINE 5 % EX PTCH
1.0000 | MEDICATED_PATCH | CUTANEOUS | Status: DC
Start: 2024-07-25 — End: 2024-07-26
  Administered 2024-07-25: 1 via TRANSDERMAL
  Filled 2024-07-25: qty 1

## 2024-07-25 MED ORDER — KETOROLAC TROMETHAMINE 30 MG/ML IJ SOLN
15.0000 mg | Freq: Once | INTRAMUSCULAR | Status: AC
Start: 2024-07-25 — End: 2024-07-25
  Administered 2024-07-25: 15 mg via INTRAMUSCULAR
  Filled 2024-07-25: qty 1

## 2024-07-25 MED ORDER — ONDANSETRON 4 MG PO TBDP
4.0000 mg | ORAL_TABLET | Freq: Once | ORAL | Status: AC
Start: 2024-07-25 — End: 2024-07-25
  Administered 2024-07-25: 4 mg via ORAL
  Filled 2024-07-25: qty 1

## 2024-07-25 MED ORDER — ACETAMINOPHEN 500 MG PO TABS
1000.0000 mg | ORAL_TABLET | Freq: Once | ORAL | Status: AC
Start: 2024-07-25 — End: 2024-07-25
  Administered 2024-07-25: 1000 mg via ORAL
  Filled 2024-07-25: qty 2

## 2024-07-25 NOTE — ED Provider Notes (Signed)
 [facility]  Emergency Department Physician Note      Diagnosis/Disposition     ED Disposition:  Discharge    ED Diagnosis:  Acute left-sided low back pain without sciatica    Discharge Prescription    DIAZEPAM  (VALIUM ) 5 MG TABLET    Take 1 tablet (5 mg) by mouth every 6 (six) hours as needed (back pain)    ONDANSETRON  (ZOFRAN -ODT) 4 MG DISINTEGRATING TABLET    Dissolve 1 tablet (4 mg) in the mouth every 6 (six) hours as needed for Nausea       History of Present Illness      Chief Complaint: Back Pain     78 y.o. female with past medical history as below   History of Present Illness    Pw back pain  Was reaching for a cup up high and pulled L mid to lower back  Has had pain ever since, waxing and waning, nonradiating, worse with certain positions and palpation, slightly improved by Tylenol  meloxicam  and lidocaine  patch  Patient has been ambulatory  Denies fevers, chills, IVDU, trauma, falls, lower extremity weakness, saddle anesthesia, bowel/bladder retention or incontinence, back surgery      Independent Historian (other than patient): No  Additional History Provided by Independent Historian:      Physical Exam     ED Triage Vitals   Encounter Vitals Group      BP 07/25/24 2134 168/78      Girls Systolic BP Percentile --       Girls Diastolic BP Percentile --       Boys Systolic BP Percentile --       Boys Diastolic BP Percentile --       Heart Rate 07/25/24 2134 90      Resp Rate 07/25/24 2134 19      Temp 07/25/24 2134 98.8 F (37.1 C)      Temp src 07/25/24 2134 Temporal      SpO2 07/25/24 2134 98 %      Weight 07/25/24 2130 70.8 kg      Height 07/25/24 2130 1.549 m      Head Circumference --       Peak Flow --       Pain Score 07/25/24 2134 8      Pain Loc --       Pain Education --       Exclude from Growth Chart --       Physical Exam   Physical Exam      Physical Exam    General:  Well-appearing, NAD.   Skin:  Warm, dry. No rash.   Head:  Normocephalic.  Atraumatic.   Neck: Normal ROM,  supple.   EENT: EOMI. PERRL. Oropharynx clear & moist.   CV:  RRR, no MRG  Respiratory:  CTAB, no W/R/R. Respirations are non-labored.   GI:  Soft, nondistended, nontender.   MSK: No obvious deformity. Grossly normal AROM at major joints.  Left-sided thoracolumbar paraspinous muscle tenderness.  No midline spine tenderness  Neuro:  Alert and oriented.  Clear & fluent speech.   5/5 strength in BUE/BLE. Grossly intact sensation in BUE/BLE.      Medical Decision Making        PRIMARY PROBLEM LIST      1. Acute illness/injury with risk to life or bodily function (based on differential diagnosis or evaluation) DIAGNOSIS: Back pain         DISCUSSION      Assessment &  Plan    78 year old female presenting for left-sided back pain for the last 3 weeks after reaching up high to grab an object.  Is tender throughout left-sided paraspinous musculature.  No red flag symptoms to necessitate MRI.  Has no weakness or numbness.  We will treat symptomatically     The patient was deemed stable for discharge. They were given strict return precautions as it relates to their presumed diagnosis, verbalized understanding of these precautions and agreed to follow up as instructed. All questions were answered prior to discharge.    Additional Notes                                   ED Course as of 07/25/24 2241   Sat Jul 25, 2024   2241 Pt reports symptomatic improvement [TL]      ED Course User Index  [TL] Marnell Dollar, MD           Vital Signs: Reviewed the patient's vital signs.   Nursing Notes: Reviewed and utilized available nursing notes.   Medical Records Reviewed: Reviewed available past medical records.   Counseling: The emergency provider has spoken with the patient and discussed today's findings, in addition to providing specific details for the plan of care. Questions are answered and there is agreement with the plan.     CRITICAL CARE/PROCEDURES    Procedures   Critical care?       CARDIAC STUDIES      The following cardiac  studies were independently interpreted by me the Emergency Medicine Provider. For full cardiac study results please see chart.                                                                   EMERGENCY IMAGING STUDIES      The following imaging studies were independently interpreted by me (emergency medicine provider):                                       Supplemental Encounter Data   Medical History[7]  Past Surgical History[8]  Social History[9]  Family History[10]  Allergies[11]    Medications Administered:  Medications   lidocaine  (LIDODERM ) 5 % 1 patch (1 patch Transdermal Patch Applied 07/25/24 2151)   acetaminophen  (TYLENOL ) tablet 1,000 mg (1,000 mg Oral Given 07/25/24 2152)   diazePAM  (VALIUM ) tablet 5 mg (5 mg Oral Given 07/25/24 2152)   ketorolac  (TORADOL ) injection 15 mg (15 mg Intramuscular Given 07/25/24 2152)   ondansetron  (ZOFRAN -ODT) disintegrating tablet 4 mg (4 mg Oral Given 07/25/24 2152)     Laboratory and Imaging Studies:     No orders to display                  [7]   Past Medical History:  Diagnosis Date    Anemia     iron    currently on medication      Arthritis     general body      Arthritis     Generalized    Asthma     Asthma without status asthmaticus  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    Bilateral cataracts     Breast cancer (CMS/HCC)     Breast lump     Cancer (CMS/HCC)     breast cancer    Carcinoma (CMS/HCC)     Carcinoma of the bladder    Chicken pox     Childhood illness    Conduction Disorder     RBBB    Diabetes mellitus type II     Non-Insulin  dependent    Echocardiogram 10/2013    Gastroesophageal reflux disease     Heart murmur     Hemorrhoids without complication     Holter monitor 02/2014    Hyperlipidemia     Hypertensive disorder     Malignant neoplasm of breast (CMS/HCC)     Measles     Childhood illness    Mumps     Childhood illness    Myocardial perfusion scan 11/2013, 12/2013    MPI single Isotope excrise; MPI dual Isotope Lexiscan      Rash     RBBB     Spinal stenosis     Stage 2 chronic kidney disease 10/29/2023    Syncope    [8]   Past Surgical History:  Procedure Laterality Date    ABLATION OF DYSRHYTHMIC FOCUS      ARTHROSCOPY, KNEE Left 04/21/2015    Procedure: ARTHROSCOPY, KNEE PARTIAL MEDIAL AND LATERAL MENISECTOMY LEFT KNEE;  Surgeon: Evie Shlomo CROME, MD;  Location: [facility];  Service: Orthopedics;  Laterality: Left;  partial medial menisectomy    BREAST BIOPSY      COLONOSCOPY, SCREENING N/A 12/07/2022    Procedure: COLONOSCOPY, SCREENING;  Surgeon: Vernia Mabel HERO, MD;  Location: [facility];  Service: Gastroenterology;  Laterality: N/A;    EGD N/A 12/07/2022    Procedure: EGD;  Surgeon: Vernia Mabel HERO, MD;  Location: [facility];  Service: Gastroenterology;  Laterality: N/A;    HAND SURGERY      HYSTERECTOMY      total    KNEE SURGERY      TUMOR REMOVAL Left     neck  benign    [9]   Social History  Tobacco Use    Smoking status: Never     Passive exposure: Never    Smokeless tobacco: Never   Vaping Use    Vaping status: Never Used   Substance Use Topics    Alcohol use: No    Drug use: Never   [10]   Family History  Problem Relation Name Age of Onset    Diabetes Mother Quetzaly Ebner     Diabetes Sister Lincey Clyburn     Diabetes Sister Reena Oris     Malignant hyperthermia Neg Hx      Pseudochol deficiency Neg Hx      Anesthesia problems Neg Hx     [11]   Allergies  Allergen Reactions    Aspirin      Break out in sweat and dizziness     Other Environmental     Peppers     Pollen Extract     Latex Itching and Rash        Marnell Dollar, MD  07/25/24 2241

## 2024-07-25 NOTE — Discharge Instructions (Signed)
 You were seen in the emergency department for back pain. Your physical exam was reassuring. No further imaging studies are necessary at this time. You have been diagnosed with a muscle strain/spasm.     Home Management:   - Use Tylenol and Ibuprofen as needed to manage symptoms   - Please take Ibuprofen 600 mg every 6 hours and Tylenol 1000 mg every 6-8 hours as needed. You can alternate these medicines every 3 hours. Do NOT take more than 4000 mg of Tylenol in a 24 hour period.   - Alternate Ice and Heat: Use ice for 10 minutes to dull pain, then allow your skin to return to room temperature, then apply heat for 10 minutes to relax pain away - Use topical treatments such as IcyHot, Voltaren Gel, Lidocaine patches   - For additional pain relief, take Valium as prescribed.  - While your pain is improved from the medicines it is very important you move and stretch to continue to help your pain. I recommend searching for videos on Youtube    Return to the Emergency Department if you have any concerns, symptoms worsen, or you develop any new or concerning symptoms such as worsening back pain, leg weakness, numbness in your groin, inability to urinate/poop, and losing control of bladder or bowel movements.

## 2024-07-28 NOTE — Telephone Encounter (Signed)
 Last filled September 2025.   Last o/v September 2025.  Patient has an upcoming appointment on August 19 2024.  Queued up 14 with 1 refills.

## 2024-07-30 ENCOUNTER — Other Ambulatory Visit (INDEPENDENT_AMBULATORY_CARE_PROVIDER_SITE_OTHER): Payer: Self-pay | Admitting: Endocrinology, Diabetes and Metabolism

## 2024-07-30 DIAGNOSIS — Z794 Long term (current) use of insulin: Secondary | ICD-10-CM

## 2024-08-03 NOTE — Telephone Encounter (Signed)
 LOV - 05/21/24    Next OV - None    Labs - 05/21/24

## 2024-08-06 DIAGNOSIS — M170 Bilateral primary osteoarthritis of knee: Secondary | ICD-10-CM | POA: Diagnosis not present

## 2024-08-06 DIAGNOSIS — M1712 Unilateral primary osteoarthritis, left knee: Secondary | ICD-10-CM | POA: Diagnosis not present

## 2024-08-11 ENCOUNTER — Encounter: Payer: Self-pay | Admitting: Family Medicine

## 2024-08-11 ENCOUNTER — Inpatient Hospital Stay
Admission: AD | Admit: 2024-08-11 | Discharge: 2024-08-15 | DRG: 315 | Disposition: A | Source: Other Acute Inpatient Hospital | Attending: Internal Medicine | Admitting: Internal Medicine

## 2024-08-11 ENCOUNTER — Emergency Department
Admission: EM | Admit: 2024-08-11 | Discharge: 2024-08-11 | Disposition: A | Discharge status: Other Acute Inpatient Hospital | Attending: Emergency Medicine | Admitting: Emergency Medicine

## 2024-08-11 ENCOUNTER — Emergency Department

## 2024-08-11 ENCOUNTER — Ambulatory Visit (INDEPENDENT_AMBULATORY_CARE_PROVIDER_SITE_OTHER): Payer: Self-pay

## 2024-08-11 DIAGNOSIS — E872 Acidosis, unspecified: Secondary | ICD-10-CM | POA: Diagnosis present

## 2024-08-11 DIAGNOSIS — I4510 Unspecified right bundle-branch block: Secondary | ICD-10-CM | POA: Diagnosis present

## 2024-08-11 DIAGNOSIS — D75839 Thrombocytosis, unspecified: Secondary | ICD-10-CM | POA: Diagnosis present

## 2024-08-11 DIAGNOSIS — R509 Fever, unspecified: Secondary | ICD-10-CM | POA: Insufficient documentation

## 2024-08-11 DIAGNOSIS — I82622 Acute embolism and thrombosis of deep veins of left upper extremity: Secondary | ICD-10-CM | POA: Diagnosis not present

## 2024-08-11 DIAGNOSIS — I1310 Hypertensive heart and chronic kidney disease without heart failure, with stage 1 through stage 4 chronic kidney disease, or unspecified chronic kidney disease: Secondary | ICD-10-CM | POA: Diagnosis present

## 2024-08-11 DIAGNOSIS — R079 Chest pain, unspecified: Secondary | ICD-10-CM

## 2024-08-11 DIAGNOSIS — K219 Gastro-esophageal reflux disease without esophagitis: Secondary | ICD-10-CM | POA: Diagnosis present

## 2024-08-11 DIAGNOSIS — E119 Type 2 diabetes mellitus without complications: Secondary | ICD-10-CM

## 2024-08-11 DIAGNOSIS — Z8551 Personal history of malignant neoplasm of bladder: Secondary | ICD-10-CM

## 2024-08-11 DIAGNOSIS — R000 Tachycardia, unspecified: Secondary | ICD-10-CM

## 2024-08-11 DIAGNOSIS — D631 Anemia in chronic kidney disease: Secondary | ICD-10-CM | POA: Diagnosis present

## 2024-08-11 DIAGNOSIS — E871 Hypo-osmolality and hyponatremia: Secondary | ICD-10-CM | POA: Diagnosis present

## 2024-08-11 DIAGNOSIS — Z7984 Long term (current) use of oral hypoglycemic drugs: Secondary | ICD-10-CM

## 2024-08-11 DIAGNOSIS — D72829 Elevated white blood cell count, unspecified: Secondary | ICD-10-CM | POA: Diagnosis present

## 2024-08-11 DIAGNOSIS — T82868A Thrombosis of vascular prosthetic devices, implants and grafts, initial encounter: Secondary | ICD-10-CM | POA: Diagnosis not present

## 2024-08-11 DIAGNOSIS — Z853 Personal history of malignant neoplasm of breast: Secondary | ICD-10-CM

## 2024-08-11 DIAGNOSIS — I82612 Acute embolism and thrombosis of superficial veins of left upper extremity: Secondary | ICD-10-CM | POA: Diagnosis not present

## 2024-08-11 DIAGNOSIS — E1165 Type 2 diabetes mellitus with hyperglycemia: Secondary | ICD-10-CM | POA: Diagnosis present

## 2024-08-11 DIAGNOSIS — E1122 Type 2 diabetes mellitus with diabetic chronic kidney disease: Secondary | ICD-10-CM | POA: Diagnosis present

## 2024-08-11 DIAGNOSIS — I3139 Other pericardial effusion (noninflammatory): Principal | ICD-10-CM | POA: Diagnosis present

## 2024-08-11 DIAGNOSIS — R579 Shock, unspecified: Secondary | ICD-10-CM | POA: Diagnosis present

## 2024-08-11 DIAGNOSIS — N179 Acute kidney failure, unspecified: Secondary | ICD-10-CM | POA: Diagnosis not present

## 2024-08-11 DIAGNOSIS — Z79899 Other long term (current) drug therapy: Secondary | ICD-10-CM

## 2024-08-11 DIAGNOSIS — J45909 Unspecified asthma, uncomplicated: Secondary | ICD-10-CM | POA: Diagnosis present

## 2024-08-11 DIAGNOSIS — N182 Chronic kidney disease, stage 2 (mild): Secondary | ICD-10-CM | POA: Diagnosis present

## 2024-08-11 DIAGNOSIS — I471 Supraventricular tachycardia, unspecified: Secondary | ICD-10-CM | POA: Diagnosis not present

## 2024-08-11 DIAGNOSIS — E785 Hyperlipidemia, unspecified: Secondary | ICD-10-CM | POA: Diagnosis present

## 2024-08-11 DIAGNOSIS — I10 Essential (primary) hypertension: Secondary | ICD-10-CM

## 2024-08-11 LAB — COVID-19 (SARS-COV-2) & INFLUENZA  A/B, NAA (ROCHE LIAT)
Influenza A RNA: NOT DETECTED
Influenza B RNA: NOT DETECTED
SARS-CoV-2 (COVID-19) RNA: NOT DETECTED

## 2024-08-11 LAB — COMPREHENSIVE METABOLIC PANEL
ALT: 15 U/L (ref <=55)
AST (SGOT): 31 U/L (ref <=41)
Albumin/Globulin Ratio: 0.7 — ABNORMAL LOW (ref 0.9–2.2)
Albumin: 3.3 g/dL — ABNORMAL LOW (ref 3.5–4.9)
Alkaline Phosphatase: 85 U/L (ref 37–117)
Anion Gap: 15 (ref 5.0–15.0)
BUN: 14 mg/dL (ref 7–21)
Bilirubin, Total: 0.9 mg/dL (ref 0.2–1.2)
CO2: 23 meq/L (ref 17–29)
Calcium: 8.7 mg/dL (ref 7.9–10.2)
Chloride: 96 meq/L — ABNORMAL LOW (ref 99–111)
Creatinine: 0.9 mg/dL (ref 0.4–1.0)
GFR: >60 mL/min/1.73 m2 (ref >=60.0)
Globulin: 4.7 g/dL — ABNORMAL HIGH (ref 2.0–3.6)
Glucose: 109 mg/dL — ABNORMAL HIGH (ref 70–100)
Potassium: 3.8 meq/L (ref 3.5–5.3)
Protein, Total: 8 g/dL (ref 6.0–8.3)
Sodium: 134 meq/L — ABNORMAL LOW (ref 135–145)

## 2024-08-11 LAB — LAB USE ONLY - CBC WITH DIFFERENTIAL
Absolute Basophils: 0.04 x10 3/uL (ref 0.00–0.08)
Absolute Eosinophils: 0.03 x10 3/uL (ref 0.00–0.44)
Absolute Immature Granulocytes: 0.03 x10 3/uL (ref 0.00–0.07)
Absolute Lymphocytes: 1.89 x10 3/uL (ref 0.42–3.22)
Absolute Monocytes: 1.51 x10 3/uL — ABNORMAL HIGH (ref 0.21–0.85)
Absolute Neutrophils: 7.68 x10 3/uL — ABNORMAL HIGH (ref 1.10–6.33)
Absolute nRBC: 0 x10 3/uL (ref <=0.00)
Basophils %: 0.4 %
Eosinophils %: 0.3 %
Hematocrit: 34.6 % — ABNORMAL LOW (ref 34.7–43.7)
Hemoglobin: 10.7 g/dL — ABNORMAL LOW (ref 11.4–14.8)
Immature Granulocytes %: 0.3 %
Lymphocytes %: 16.9 %
MCH: 22.6 pg — ABNORMAL LOW (ref 25.1–33.5)
MCHC: 30.9 g/dL — ABNORMAL LOW (ref 31.5–35.8)
MCV: 73.2 fL — ABNORMAL LOW (ref 78.0–96.0)
MPV: 8.8 fL — ABNORMAL LOW (ref 8.9–12.5)
Monocytes %: 13.5 %
Neutrophils %: 68.6 %
Platelet Count: 382 x10 3/uL — ABNORMAL HIGH (ref 142–346)
Preliminary Absolute Neutrophil Count: 7.68 x10 3/uL — ABNORMAL HIGH (ref 1.10–6.33)
RBC: 4.73 x10 6/uL (ref 3.90–5.10)
RDW: 13 % (ref 11–15)
WBC: 11.18 x10 3/uL — ABNORMAL HIGH (ref 3.10–9.50)
nRBC %: 0 /100{WBCs} (ref <=0.0)

## 2024-08-11 LAB — URINALYSIS WITH REFLEX TO MICROSCOPIC EXAM IF INDICATED
Urine Bilirubin: NEGATIVE
Urine Blood: NEGATIVE
Urine Leukocyte Esterase: NEGATIVE
Urine Nitrite: NEGATIVE
Urine Protein: NEGATIVE
Urine Specific Gravity: 1.015 (ref 1.001–1.035)
Urine Urobilinogen: NORMAL mg/dL (ref 0.2–2.0)
Urine pH: 6 (ref 5.0–8.0)

## 2024-08-11 LAB — LIPASE: Lipase: 25 U/L (ref 8–78)

## 2024-08-11 LAB — MAGNESIUM: Magnesium: 1.8 mg/dL (ref 1.6–2.6)

## 2024-08-11 LAB — HIGH SENSITIVITY TROPONIN-I: hs Troponin: 3.9 ng/L (ref <=14.0)

## 2024-08-11 LAB — LACTIC ACID: Lactic Acid: 1.4 mmol/L (ref 0.4–2.0)

## 2024-08-11 MED ORDER — SODIUM CHLORIDE 0.9 % IV BOLUS
500.0000 mL | Freq: Once | INTRAVENOUS | Status: AC
  Administered 2024-08-11: 500 mL via INTRAVENOUS
  Filled 2024-08-11: qty 1000

## 2024-08-11 MED ORDER — ACETAMINOPHEN 500 MG PO TABS
1000.0000 mg | ORAL_TABLET | Freq: Once | ORAL | Status: AC
  Administered 2024-08-11: 1000 mg via ORAL
  Filled 2024-08-11: qty 2

## 2024-08-11 MED ORDER — ONDANSETRON HCL 4 MG/2ML IJ SOLN
4.0000 mg | Freq: Once | INTRAMUSCULAR | Status: AC
  Administered 2024-08-11: 4 mg via INTRAVENOUS
  Filled 2024-08-11: qty 2

## 2024-08-11 MED ORDER — IOHEXOL 350 MG/ML IV SOLN
100.0000 mL | Freq: Once | INTRAVENOUS | Status: AC | PRN
  Administered 2024-08-11: 100 mL via INTRAVENOUS

## 2024-08-11 NOTE — ED to IP RN Note (Signed)
 EMERGENCY CARE [facility]  ED NURSING NOTE FOR THE RECEIVING INPATIENT NURSE   ED NURSE Brynlea Spindler   [facility] [phone]   ED CHARGE RN naliah   ADMISSION INFORMATION   Helen [name] is a 78 y.o. female admitted with an ED diagnosis of:    1. Pericardial effusion         Isolation: None   Allergies: Aspirin, Other environmental, Peppers, Pollen extract, and Latex   Holding Orders confirmed? No   Belongings Documented? Yes   Home medications sent to pharmacy confirmed? N/A   NURSING CARE   Patient Comes From:   Mental Status: Home Independent  alert and oriented   ADL: Independent with all ADLs   Ambulation: mild difficulty   Pertinent Information  and Safety Concerns:     Broset Violence Risk Level: Low Pt endorses dizziness and palpitations that began today. Reports fevers yesterday. Endorsing nausea r/t to chronic back pain.      CT / NIH   CT Head ordered on this patient?  No   NIH/Dysphagia assessment done prior to admission? N/A   VITAL SIGNS (at the time of this note)      Vitals:    08/11/24 2036   BP: 143/67   Pulse: (!) 105   Resp: 21   Temp: (!) 101.1 F (38.4 C)   SpO2: 95%     Pain Score: 0-No pain (08/11/24 1916)

## 2024-08-11 NOTE — ED Provider Notes (Signed)
 [facility]  Emergency Department Physician Note      Diagnosis/Disposition     Diagnosis:  Final diagnoses:   Pericardial effusion       Disposition:  ED Disposition       ED Disposition   Transfer to Another Facility    Condition   Stable    Date/Time   Tue Aug 11, 2024  8:40 PM    Comment   --               Prescriptions:  Patient's Medications   New Prescriptions    No medications on file   Previous Medications    ALBUTEROL  (PROVENTIL ) (2.5 MG/3ML) 0.083% NEBULIZER SOLUTION    TAKE 3 MLS (2.5 MG TOTAL) BY NEBULIZATION EVERY 4 (FOUR) HOURS AS NEEDED FOR SHORTNESS OF BREATH OR WHEEZING    ALBUTEROL  SULFATE HFA (PROVENTIL ) 108 (90 BASE) MCG/ACT INHALER    INHALE 2 PUFFS INTO THE LUNGS EVERY 4 HOURS AS NEEDED FOR WHEEZING OR SHORTNESS OF BREATH.    AMLODIPINE  (NORVASC ) 10 MG TABLET    TAKE 1 TABLET (10 MG) BY MOUTH DAILY.    ATORVASTATIN  (LIPITOR) 40 MG TABLET    TAKE 1 TABLET BY MOUTH EVERY DAY    AZELASTINE  HCL 137 MCG/SPRAY SOLUTION    2 SPRAYS BY NASAL ROUTE 2 (TWO) TIMES DAILY USE IN EACH NOSTRIL AS DIRECTED    BLOOD GLUCOSE MONITORING SUPPL (ONE TOUCH ULTRA 2) W/DEVICE KIT    One daily    CETIRIZINE (ZYRTEC) 10 MG TABLET    Take 1 tablet (10 mg) by mouth once daily    CHOLECALCIFEROL (VITAMIN D3) 2000 UNIT CAPSULE    TAKE 1 CAPSULE BY MOUTH EVERY DAY    DEXAMETHASONE (DECADRON) 0.1 % OPHTHALMIC SOLUTION    Place 1 drop into both eyes    DIAZEPAM  (VALIUM ) 5 MG TABLET    Take 1 tablet (5 mg) by mouth every 6 (six) hours as needed (back pain)    EPINEPHRINE  (EPIPEN  2-PAK) 0.3 MG/0.3ML SOLUTION AUTO-INJECTOR INJECTION    Inject 0.3 mLs (0.3 mg) into the muscle once as needed (Anaphylaxis)    ESOMEPRAZOLE  (NEXIUM ) 40 MG CAPSULE    Take 1 capsule (40 mg) by mouth once daily    ESTRADIOL (ESTRACE) 0.01 % VAGINAL CREAM        FERROUS SULFATE 325 (65 FE) MG TABLET    Take 1 tablet (325 mg) by mouth every morning with breakfast    FLUTICASONE  (FLONASE ) 50 MCG/ACT NASAL SPRAY    2 sprays by Nasal route  daily    FLUTICASONE  (FLOVENT  HFA) 220 MCG/ACT INHALER    INHALE 1 PUFF INTO THE LUNGS TWICE A DAY    GLIPIZIDE  (GLUCOTROL ) 10 MG TABLET    Take 2 tablets (20 mg) by mouth 2 (two) times daily before meals    GLUCAGON , RDNA, (GLUCAGON  EMERGENCY) 1 MG KIT    Inject 1 mg into the skin once as needed (severe hypoglycemia)    HYDROCORTISONE  (ANUSOL -HC) 25 MG SUPPOSITORY    PLACE 1 SUPPOSITORY (25 MG) RECTALLY 2 (TWO) TIMES DAILY- NOT COVERED    HYDROXYZINE (ATARAX) 25 MG TABLET    PLEASE SEE ATTACHED FOR DETAILED DIRECTIONS    JARDIANCE  25 MG TABLET    TAKE 1 TABLET BY MOUTH EVERY DAY IN THE MORNING    LANCETS (ONETOUCH ULTRASOFT) LANCETS    Use to check blood sugars 2 times daily    LIDOCAINE  (LIDODERM ) 5 %    Place 1  patch onto the skin once daily as needed (pain) Remove & Discard patch within 12 hours    LINAGLIPTIN  (TRADJENTA ) 5 MG TABLET    TAKE 1 TABLET BY MOUTH EVERY DAY    MELOXICAM  (MOBIC ) 7.5 MG TABLET    Take 1 tablet (7.5 mg) by mouth once daily as needed for Pain    METFORMIN  (GLUCOPHAGE -XR) 500 MG 24 HR TABLET    TAKE 2 TABLETS (1,000 MG) BY MOUTH TWICE A DAY    MONTELUKAST (SINGULAIR) 10 MG TABLET    Take 1 tablet (10 mg) by mouth daily    NASONEX 50 MCG/ACT NASAL SPRAY    2 sprays by Nasal route daily as needed    ONDANSETRON  (ZOFRAN -ODT) 4 MG DISINTEGRATING TABLET    Dissolve 1 tablet (4 mg) in the mouth every 6 (six) hours as needed for Nausea    ONETOUCH ULTRA TEST STRIP    TWICE A DAY    VALSARTAN -HYDROCHLOROTHIAZIDE  (DIOVAN -HCT) 160-12.5 MG PER TABLET    TAKE 1 TABLET BY MOUTH EVERY DAY   Modified Medications    No medications on file   Discontinued Medications    No medications on file         HISTORY OF PRESENT ILLNESS     Chief Complaint: Tachycardia and Fever       78 year old female with below past medical history presents with days of palpitations, nausea with decreased appetite, some fever, no cough or vomiting or diarrhea, no dysuria, no abdominal pain, no sick contacts.       Independent  Historian (other than patient): Family (list in HPI)  Additional History Provided by Independent Historian: above from visitor    MEDICAL HISTORY     Past Medical History:  Medical History[1]    Past Surgical History:  Past Surgical History[2]    Social History:  Social History[3]    Family History:  Family History[4]    Outpatient Medication:  Previous Medications    ALBUTEROL  (PROVENTIL ) (2.5 MG/3ML) 0.083% NEBULIZER SOLUTION    TAKE 3 MLS (2.5 MG TOTAL) BY NEBULIZATION EVERY 4 (FOUR) HOURS AS NEEDED FOR SHORTNESS OF BREATH OR WHEEZING    ALBUTEROL  SULFATE HFA (PROVENTIL ) 108 (90 BASE) MCG/ACT INHALER    INHALE 2 PUFFS INTO THE LUNGS EVERY 4 HOURS AS NEEDED FOR WHEEZING OR SHORTNESS OF BREATH.    AMLODIPINE  (NORVASC ) 10 MG TABLET    TAKE 1 TABLET (10 MG) BY MOUTH DAILY.    ATORVASTATIN  (LIPITOR) 40 MG TABLET    TAKE 1 TABLET BY MOUTH EVERY DAY    AZELASTINE  HCL 137 MCG/SPRAY SOLUTION    2 SPRAYS BY NASAL ROUTE 2 (TWO) TIMES DAILY USE IN EACH NOSTRIL AS DIRECTED    BLOOD GLUCOSE MONITORING SUPPL (ONE TOUCH ULTRA 2) W/DEVICE KIT    One daily    CETIRIZINE (ZYRTEC) 10 MG TABLET    Take 1 tablet (10 mg) by mouth once daily    CHOLECALCIFEROL (VITAMIN D3) 2000 UNIT CAPSULE    TAKE 1 CAPSULE BY MOUTH EVERY DAY    DEXAMETHASONE (DECADRON) 0.1 % OPHTHALMIC SOLUTION    Place 1 drop into both eyes    DIAZEPAM  (VALIUM ) 5 MG TABLET    Take 1 tablet (5 mg) by mouth every 6 (six) hours as needed (back pain)    EPINEPHRINE  (EPIPEN  2-PAK) 0.3 MG/0.3ML SOLUTION AUTO-INJECTOR INJECTION    Inject 0.3 mLs (0.3 mg) into the muscle once as needed (Anaphylaxis)    ESOMEPRAZOLE  (NEXIUM ) 40 MG CAPSULE  Take 1 capsule (40 mg) by mouth once daily    ESTRADIOL (ESTRACE) 0.01 % VAGINAL CREAM        FERROUS SULFATE 325 (65 FE) MG TABLET    Take 1 tablet (325 mg) by mouth every morning with breakfast    FLUTICASONE  (FLONASE ) 50 MCG/ACT NASAL SPRAY    2 sprays by Nasal route daily    FLUTICASONE  (FLOVENT  HFA) 220 MCG/ACT INHALER    INHALE 1  PUFF INTO THE LUNGS TWICE A DAY    GLIPIZIDE  (GLUCOTROL ) 10 MG TABLET    Take 2 tablets (20 mg) by mouth 2 (two) times daily before meals    GLUCAGON , RDNA, (GLUCAGON  EMERGENCY) 1 MG KIT    Inject 1 mg into the skin once as needed (severe hypoglycemia)    HYDROCORTISONE  (ANUSOL -HC) 25 MG SUPPOSITORY    PLACE 1 SUPPOSITORY (25 MG) RECTALLY 2 (TWO) TIMES DAILY- NOT COVERED    HYDROXYZINE (ATARAX) 25 MG TABLET    PLEASE SEE ATTACHED FOR DETAILED DIRECTIONS    JARDIANCE  25 MG TABLET    TAKE 1 TABLET BY MOUTH EVERY DAY IN THE MORNING    LANCETS (ONETOUCH ULTRASOFT) LANCETS    Use to check blood sugars 2 times daily    LIDOCAINE  (LIDODERM ) 5 %    Place 1 patch onto the skin once daily as needed (pain) Remove & Discard patch within 12 hours    LINAGLIPTIN  (TRADJENTA ) 5 MG TABLET    TAKE 1 TABLET BY MOUTH EVERY DAY    MELOXICAM  (MOBIC ) 7.5 MG TABLET    Take 1 tablet (7.5 mg) by mouth once daily as needed for Pain    METFORMIN  (GLUCOPHAGE -XR) 500 MG 24 HR TABLET    TAKE 2 TABLETS (1,000 MG) BY MOUTH TWICE A DAY    MONTELUKAST (SINGULAIR) 10 MG TABLET    Take 1 tablet (10 mg) by mouth daily    NASONEX 50 MCG/ACT NASAL SPRAY    2 sprays by Nasal route daily as needed    ONDANSETRON  (ZOFRAN -ODT) 4 MG DISINTEGRATING TABLET    Dissolve 1 tablet (4 mg) in the mouth every 6 (six) hours as needed for Nausea    ONETOUCH ULTRA TEST STRIP    TWICE A DAY    VALSARTAN -HYDROCHLOROTHIAZIDE  (DIOVAN -HCT) 160-12.5 MG PER TABLET    TAKE 1 TABLET BY MOUTH EVERY DAY         PHYSICAL EXAM     ED Triage Vitals [08/11/24 1916]   Encounter Vitals Group      BP 162/74      Girls Systolic BP Percentile       Girls Diastolic BP Percentile       Boys Systolic BP Percentile       Boys Diastolic BP Percentile       Heart Rate (!) 124      Resp Rate 18      Temp (!) 101 F (38.3 C)      Temp src Oral      SpO2 97 %      Weight 69.9 kg      Height 1.549 m      Head Circumference       Peak Flow       Pain Score 0      Pain Loc       Pain Education        Exclude from Growth Chart        Physical Exam  Vitals and nursing note reviewed.  Cardiovascular:      Rate and Rhythm: Regular rhythm. Tachycardia present.      Comments: Pocus with moderate pericardial effusion   Pulmonary:      Effort: Pulmonary effort is normal. No respiratory distress.      Breath sounds: No wheezing.   Abdominal:      Palpations: Abdomen is soft.      Tenderness: There is no abdominal tenderness.   Neurological:      Mental Status: She is alert.         HPI & MEDICAL DECISION MAKING       Chief Complaint: Tachycardia and Fever       PRIMARY PROBLEM LIST     Acute illness/injury with risk to life or bodily function (based on differential diagnosis or evaluation) Severe   Chronic Illness Impacting Care of the above problem: Advanced age Increases complexity of evaluation and Increases the risk of severe disease  Differential Diagnosis in MDM below      DISCUSSION        Ddx: UTI, pna, viral synrome, no abd pain/tenderness to indicate intraabdominal infection, plan for labwork, cxr, fluids for dispo.       ED Course as of 08/11/24 2154   Tue Aug 11, 2024   2032 Dr. Claudene (interventional cardiology) consulted recommended admission to [facility] [SA]   2151 Pt with run of SVT. Dr. Claudene (reconsulted)  [SA]      ED Course User Index  [SA] Arlander Leader, MD         The patient was admitted and handed off to Dr. Sueellen. We discussed all aspects of the work-up that had been completed in the ED, any pending studies/therapies, and all clinical decision making at the time care was handed off.     Vital Signs: Reviewed the patient's vital signs.   Nursing Notes: Reviewed and utilized available nursing notes.  Counseling: The emergency provider has spoken with the patient and discussed today's findings, in addition to providing specific details for the plan of care.  Questions are answered and there is agreement with the plan.        If patient is being hospitalized is severe sepsis or septic shock suspected?:  N/A    Was management discussed with a consultant?: Yes (explain)    External Records Reviewed?: Physician Office Records  Additional Notes                    MIPS DOCUMENTATION                CARDIAC STUDIES    The following cardiac studies were independently interpreted by the Emergency Medicine Physician.  For full cardiac study results please see chart.    EKG:  Interpreted by the EP.   Time Interpreted: 29   Rate: 119   Interpretation: Sinus tachy, LAD, ST depression of II, avf, RBBB, TWI of v2-4   Comparison: St depression of II, avf new from 11/25      EMERGENCY IMAGING STUDIES    The following imagine studies were independently interpreted by me (emergency physician):    Radiology:  Interpreted by me (ED Physician)  Study: Chest Xray   Impression: No infiltrate, no pneumothorax, no pulmonary edema      RADIOLOGY IMAGING STUDIES      Chest AP Portable   Final Result      Marked enlargement of the cardiac silhouette is new from 05/25/21. This may   reflect cardiomegaly and/or pericardial  effusion.      Jeana RONAL Sor, MD   08/11/2024 8:14 PM      CT Angiogram Chest Abdomen Pelvis    (Results Pending)       EMERGENCY DEPT. MEDICATIONS      ED Medication Orders (From admission, onward)      Start Ordered     Status Ordering Provider    08/11/24 2102 08/11/24 2103  iohexol  (OMNIPAQUE ) 350 MG/ML injection 100 mL  IMG once as needed        Route: Intravenous  Ordered Dose: 100 mL       Ordered Makel Mcmann    08/11/24 1938 08/11/24 1937  ondansetron  (ZOFRAN ) injection 4 mg  Once        Route: Intravenous  Ordered Dose: 4 mg       Last MAR action: Given Dorthula Bier    08/11/24 1938 08/11/24 1937  acetaminophen  (TYLENOL ) tablet 1,000 mg  Once        Route: Oral  Ordered Dose: 1,000 mg       Last MAR action: Given Maxfield Gildersleeve    08/11/24 1938 08/11/24 1937  sodium chloride  0.9 % bolus 500 mL  Once        Route: Intravenous  Ordered Dose: 500 mL       Last MAR action: New Bag Advit Trethewey            LABORATORY  RESULTS    Ordered and independently interpreted AVAILABLE laboratory tests.   Results for orders placed or performed during the hospital encounter of 08/11/24 (from the past 24 hours)    Collection Time: 08/11/24  7:33 PM   Result Value    Lactic Acid 1.4   CBC with Differential (Component)    Collection Time: 08/11/24  7:33 PM   Result Value    WBC 11.18 (H)    Hemoglobin 10.7 (L)    Hematocrit 34.6 (L)    Platelet Count 382 (H)    MPV 8.8 (L)    RBC 4.73    MCV 73.2 (L)    MCH 22.6 (L)    MCHC 30.9 (L)    RDW 13    nRBC % 0.0    Absolute nRBC 0.00    Preliminary Absolute Neutrophil Count 7.68 (H)    Neutrophils % 68.6    Lymphocytes % 16.9    Monocytes % 13.5    Eosinophils % 0.3    Basophils % 0.4    Immature Granulocytes % 0.3    Absolute Neutrophils 7.68 (H)    Absolute Lymphocytes 1.89    Absolute Monocytes 1.51 (H)    Absolute Eosinophils 0.03    Absolute Basophils 0.04    Absolute Immature Granulocytes 0.03   Comprehensive Metabolic Panel    Collection Time: 08/11/24  7:34 PM   Result Value    Glucose 109 (H)    BUN 14    Creatinine 0.9    Sodium 134 (L)    Potassium 3.8    Chloride 96 (L)    CO2 23    Calcium  8.7    Anion Gap 15.0    GFR >60.0    AST (SGOT) 31    ALT 15    Alkaline Phosphatase 85    Albumin 3.3 (L)    Protein, Total 8.0    Globulin 4.7 (H)    Albumin/Globulin Ratio 0.7 (L)    Bilirubin, Total 0.9    Collection Time: 08/11/24  7:34 PM  Result Value    Lipase 25   COVID-19 (SARS-CoV-2) and Influenza A/B, NAA ([place])    Collection Time: 08/11/24  7:35 PM    Specimen: Anterior Nares; Swab   Result Value    SARS-CoV-2 (COVID-19) RNA Not Detected    Influenza A RNA Not Detected    Influenza B RNA Not Detected   High Sensitivity Troponin-I    Collection Time: 08/11/24  7:45 PM   Result Value    hs Troponin 3.9   Urinalysis with Reflex to Microscopic Exam    Collection Time: 08/11/24  7:53 PM    Specimen: Urine, Clean Catch   Result Value    Urine Color Straw    Urine Clarity Clear    Urine  Specific Gravity 1.015    Urine pH 6.0    Urine Leukocyte Esterase Negative    Urine Nitrite Negative    Urine Protein Negative    Urine Glucose >1000= 4+ (A)    Urine Ketones 10= 1+ (A)    Urine Urobilinogen Normal    Urine Bilirubin Negative    Urine Blood Negative         CRITICAL CARE/PROCEDURES    Procedures  CRITICAL CARE: The high probability of sudden, clinically significant deterioration in the patient's condition required the highest level of my preparedness to intervene urgently.    The services I provided to this patient were to treat and/or prevent clinically significant deterioration that could result in: death.  Services included the following: chart data review, reviewing nursing notes and/or old charts, documentation time, consultant collaboration regarding findings and treatment options, medication orders and management, direct patient care, re-evaluations, vital sign assessments and ordering, interpreting and reviewing diagnostic studies/lab tests.    Aggregate critical care time was 45 minutes, which includes only time during which I was engaged in work directly related to the patient's care, as described above, whether at the bedside or elsewhere in the Emergency Department.  It did not include time spent performing other reported procedures or the services of residents, students, nurses or physician assistants.    DIAGNOSIS          Please pardon any potential grammatical errors or typos as aspects of this note may have been created through speech-to-text software.          Arlander Leader, MD  08/11/24 2104       [1]  Past Medical History:  Diagnosis Date   . Anemia     iron    currently on medication     . Arthritis     general body     . Arthritis     Generalized   . Asthma    . Asthma without status asthmaticus    . 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 Bilateral cataracts    . Breast cancer (CMS/HCC)    . Breast lump    . Cancer (CMS/HCC)     breast cancer   .  Carcinoma (CMS/HCC)     Carcinoma of the bladder   . Chicken pox     Childhood illness   . Conduction Disorder     RBBB   . Diabetes mellitus type II     Non-Insulin  dependent   . Echocardiogram 10/2013   . Gastroesophageal reflux disease    . Heart murmur    . Hemorrhoids without complication    . Holter monitor 02/2014   . Hyperlipidemia    .  Hypertensive disorder    . Malignant neoplasm of breast (CMS/HCC)    . Measles     Childhood illness   . Mumps     Childhood illness   . Myocardial perfusion scan 11/2013, 12/2013    MPI single Isotope excrise; MPI dual Isotope Lexiscan    . Rash    . RBBB    . Spinal stenosis    . Stage 2 chronic kidney disease 10/29/2023   . Syncope    [2]  Past Surgical History:  Procedure Laterality Date   . ABLATION OF DYSRHYTHMIC FOCUS     . ARTHROSCOPY, KNEE Left 04/21/2015    Procedure: ARTHROSCOPY, KNEE PARTIAL MEDIAL AND LATERAL MENISECTOMY LEFT KNEE;  Surgeon: Evie Shlomo CROME, MD;  Location: [facility];  Service: Orthopedics;  Laterality: Left;  partial medial menisectomy   . BREAST BIOPSY     . COLONOSCOPY, SCREENING N/A 12/07/2022    Procedure: COLONOSCOPY, SCREENING;  Surgeon: Vernia Mabel HERO, MD;  Location: [facility];  Service: Gastroenterology;  Laterality: N/A;   . EGD N/A 12/07/2022    Procedure: EGD;  Surgeon: Vernia Mabel HERO, MD;  Location: [facility];  Service: Gastroenterology;  Laterality: N/A;   . HAND SURGERY     . HYSTERECTOMY      total   . KNEE SURGERY     . TUMOR REMOVAL Left     neck  benign    [3]  Social History  Socioeconomic History   . Marital status: Divorced   Tobacco Use   . Smoking status: Never     Passive exposure: Never   . Smokeless tobacco: Never   Vaping Use   . Vaping status: Never Used   Substance and Sexual Activity   . Alcohol use: No   . Drug use: Never   . Sexual activity: Not Currently     Social Drivers of Health     Financial Resource Strain: Low Risk (11/04/2023)    Overall Financial Resource Strain (CARDIA)    . Difficulty of Paying  Living Expenses: Not hard at all   Food Insecurity: No Food Insecurity (07/25/2024)    Hunger Vital Sign    . Worried About Programme Researcher, Broadcasting/film/video in the Last Year: Never true    . Ran Out of Food in the Last Year: Never true   Transportation Needs: No Transportation Needs (07/25/2024)    PRAPARE - Transportation    . Lack of Transportation (Medical): No    . Lack of Transportation (Non-Medical): No   Physical Activity: Insufficiently Active (11/04/2023)    Exercise Vital Sign    . Days of Exercise per Week: 3 days    . Minutes of Exercise per Session: 30 min   Stress: No Stress Concern Present (11/04/2023)    [employer] of Occupational Health - Occupational Stress Questionnaire    . Feeling of Stress : Not at all   Social Connections: Socially Isolated (11/04/2023)    Social Connection and Isolation Panel    . Frequency of Communication with Friends and Family: Once a week    . Frequency of Social Gatherings with Friends and Family: Once a week    . Attends Religious Services: More than 4 times per year    . Active Member of Clubs or Organizations: No    . Attends Banker Meetings: Never    . Marital Status: Divorced   Catering Manager Violence: Not At Risk (07/25/2024)    Humiliation, Afraid, Rape, and  Kick questionnaire    . Fear of Current or Ex-Partner: No    . Emotionally Abused: No    . Physically Abused: No    . Sexually Abused: No   Housing Stability: Not At Risk (07/25/2024)    Housing Stability NCSS    . Do you have housing?: Yes    . Are you worried about losing your housing?: No   [4]  Family History  Problem Relation Name Age of Onset   . Diabetes Mother Allia Wiltsey    . Diabetes Sister Lincey Clyburn    . Diabetes Sister Reena Oris    . Malignant hyperthermia Neg Hx     . Pseudochol deficiency Neg Hx     . Anesthesia problems Neg Hx

## 2024-08-12 ENCOUNTER — Inpatient Hospital Stay

## 2024-08-12 ENCOUNTER — Encounter: Admission: AD | Disposition: A | Payer: Self-pay | Attending: Internal Medicine

## 2024-08-12 DIAGNOSIS — I3139 Other pericardial effusion (noninflammatory): Secondary | ICD-10-CM

## 2024-08-12 DIAGNOSIS — I361 Nonrheumatic tricuspid (valve) insufficiency: Secondary | ICD-10-CM

## 2024-08-12 HISTORY — PX: PERICARDIOCENTESIS: CATH2000

## 2024-08-12 LAB — ECHO ADULT TTE COMPLETE
AV Area (Cont Eq VTI): 2.1796
AV Mean Gradient: 2
IVS Diastolic Thickness (2D): 1.2
LA Dimension (2D): 2.9
LA Volume Index (BP A-L): 30.5395
LVID diastole (2D): 3.1
LVID systole (2D): 2.2
MV E/A: 0.6421
MV E/e' (Average): 6.1521
Prox Ascending Aorta Diameter: 2.7
Pulmonary Valve Findings: NORMAL
RV Basal Diastolic Dimension: 3.4
TAPSE: 1.29
Visually Estimated Ejection Fraction: 60

## 2024-08-12 LAB — LAB USE ONLY - CBC WITH DIFFERENTIAL
Absolute Basophils: 0.05 x10 3/uL (ref 0.00–0.08)
Absolute Eosinophils: 0.05 x10 3/uL (ref 0.00–0.44)
Absolute Immature Granulocytes: 0.04 x10 3/uL (ref 0.00–0.07)
Absolute Lymphocytes: 1.52 x10 3/uL (ref 0.42–3.22)
Absolute Monocytes: 1.85 x10 3/uL — ABNORMAL HIGH (ref 0.21–0.85)
Absolute Neutrophils: 6.92 x10 3/uL — ABNORMAL HIGH (ref 1.10–6.33)
Absolute nRBC: 0 x10 3/uL (ref <=0.00)
Basophils %: 0.5 %
Eosinophils %: 0.5 %
Hematocrit: 31.4 % — ABNORMAL LOW (ref 34.7–43.7)
Hemoglobin: 10.1 g/dL — ABNORMAL LOW (ref 11.4–14.8)
Immature Granulocytes %: 0.4 %
Lymphocytes %: 14.6 %
MCH: 22.6 pg — ABNORMAL LOW (ref 25.1–33.5)
MCHC: 32.2 g/dL (ref 31.5–35.8)
MCV: 70.2 fL — ABNORMAL LOW (ref 78.0–96.0)
MPV: 9.7 fL (ref 8.9–12.5)
Monocytes %: 17.7 %
Neutrophils %: 66.3 %
Platelet Count: 352 x10 3/uL — ABNORMAL HIGH (ref 142–346)
Preliminary Absolute Neutrophil Count: 6.92 x10 3/uL — ABNORMAL HIGH (ref 1.10–6.33)
RBC: 4.47 x10 6/uL (ref 3.90–5.10)
RDW: 14 % (ref 11–15)
WBC: 10.43 x10 3/uL — ABNORMAL HIGH (ref 3.10–9.50)
nRBC %: 0 /100{WBCs} (ref <=0.0)

## 2024-08-12 LAB — LAB USE ONLY - BODY FLUID CELL COUNT
Body Fluid Eosinophils: 1 %
Body Fluid Lymphocytes: 45 %
Body Fluid Monocyte/Macrophage Cells: 7 %
Body Fluid Polymorphonuclear Cells: 47 % — ABNORMAL HIGH (ref <25)
Body Fluid RBC: 279000 /mm3
Body Fluid Total Nucleated Cells: 2928 /mm3
Body Fluid WBC: 2899 /mm3 — ABNORMAL HIGH (ref <500)

## 2024-08-12 LAB — BASIC METABOLIC PANEL
Anion Gap: 16 — ABNORMAL HIGH (ref 5.0–15.0)
BUN: 12 mg/dL (ref 7–21)
CO2: 21 meq/L (ref 17–29)
Calcium: 8.7 mg/dL (ref 7.9–10.2)
Chloride: 102 meq/L (ref 99–111)
Creatinine: 0.8 mg/dL (ref 0.4–1.0)
GFR: >60 mL/min/1.73 m2 (ref >=60.0)
Glucose: 92 mg/dL (ref 70–100)
Potassium: 4 meq/L (ref 3.5–5.3)
Sodium: 139 meq/L (ref 135–145)

## 2024-08-12 LAB — NARES, MRSA (METHICILLIN-RESISTANT STAPHYLOCOCCUS AUREUS) SCREENING, PCR: MRSA (methicillin resistant Staphylococcus aureus) DNA: NOT DETECTED

## 2024-08-12 LAB — BODY FLUID GLUCOSE: Body Fluid Glucose: 115 mg/dL

## 2024-08-12 LAB — PT/INR
INR: 1.3 — ABNORMAL HIGH (ref 0.9–1.1)
PT: 14.1 s — ABNORMAL HIGH (ref 10.1–12.9)

## 2024-08-12 LAB — LAB USE ONLY - LIGHT GREEN - LIHEP PST HOLD TUBE

## 2024-08-12 LAB — BODY FLUID PROTEIN: Body Fluid Protein: 5.9 g/dL

## 2024-08-12 LAB — LAB USE ONLY - LIGHT BLUE - CITRATE HOLD TUBE

## 2024-08-12 LAB — WHOLE BLOOD GLUCOSE POCT
Whole Blood Glucose POCT: 114 mg/dL — ABNORMAL HIGH (ref 70–100)
Whole Blood Glucose POCT: 123 mg/dL — ABNORMAL HIGH (ref 70–100)
Whole Blood Glucose POCT: 138 mg/dL — ABNORMAL HIGH (ref 70–100)
Whole Blood Glucose POCT: 152 mg/dL — ABNORMAL HIGH (ref 70–100)
Whole Blood Glucose POCT: 158 mg/dL — ABNORMAL HIGH (ref 70–100)
Whole Blood Glucose POCT: 180 mg/dL — ABNORMAL HIGH (ref 70–100)

## 2024-08-12 LAB — BODY FLUID AMYLASE: Body Fluid Amylase: 25 U/L

## 2024-08-12 LAB — PROCALCITONIN: Procalcitonin: 0.1 ng/mL (ref 0.00–0.10)

## 2024-08-12 LAB — BODY FLUID CHOLESTEROL: Body Fluid Cholesterol: 58 mg/dL

## 2024-08-12 LAB — LAB USE ONLY - URINE GRAY CULTURE HOLD TUBE

## 2024-08-12 LAB — ECHO ADULT TTE LIMITED

## 2024-08-12 LAB — HIGH SENSITIVITY TROPONIN-I: hs Troponin: 5.8 ng/L (ref <=14.0)

## 2024-08-12 LAB — LAB USE ONLY - ACID FAST BACILLI STAIN: Acid Fast Bacilli Stain: NONE SEEN

## 2024-08-12 LAB — BODY FLUID LACTATE DEHYDROGENASE (LDH): Body Fluid LDH: 1499 U/L

## 2024-08-12 SURGERY — Pericardiocentesis
Anesthesia: Conscious Sedation

## 2024-08-12 MED ORDER — LIDOCAINE HCL 1 % IJ SOLN
INTRAMUSCULAR | Status: AC | PRN
  Administered 2024-08-12: 5 mL

## 2024-08-12 MED ORDER — MIDAZOLAM HCL 1 MG/ML IJ SOLN (WRAP)
INTRAMUSCULAR | Status: AC
  Filled 2024-08-12: qty 2

## 2024-08-12 MED ORDER — ENOXAPARIN SODIUM 40 MG/0.4ML IJ SOSY: 40.0000 mg | PREFILLED_SYRINGE | Freq: Every day | INTRAMUSCULAR | Status: DC

## 2024-08-12 MED ORDER — POTASSIUM CHLORIDE 20 MEQ PO PACK
0.0000 meq | PACK | ORAL | Status: DC | PRN
  Administered 2024-08-13: 20 meq via ORAL
  Filled 2024-08-12: qty 1

## 2024-08-12 MED ORDER — SODIUM CHLORIDE 0.9 % IV MBP
4.5000 g | Freq: Four times a day (QID) | INTRAVENOUS | Status: DC
  Administered 2024-08-12 – 2024-08-13 (×4): 4.5 g via INTRAVENOUS
  Filled 2024-08-12 (×5): qty 22.2

## 2024-08-12 MED ORDER — PROCHLORPERAZINE EDISYLATE 10 MG/2ML IJ SOLN
10.0000 mg | Freq: Once | INTRAMUSCULAR | Status: AC
  Administered 2024-08-12: 10 mg via INTRAVENOUS
  Filled 2024-08-12: qty 2

## 2024-08-12 MED ORDER — MIDAZOLAM HCL 1 MG/ML IJ SOLN (WRAP)
INTRAMUSCULAR | Status: AC | PRN
  Administered 2024-08-12: .5 mg via INTRAVENOUS

## 2024-08-12 MED ORDER — SODIUM CHLORIDE 0.9 % IV SOLN
INTRAVENOUS | Status: DC
  Filled 2024-08-12: qty 1000

## 2024-08-12 MED ORDER — DEXTROSE 10 % IV BOLUS: 12.5000 g | INTRAVENOUS | Status: DC | PRN

## 2024-08-12 MED ORDER — DEXTROSE 50 % IV SOLN: 12.5000 g | INTRAVENOUS | Status: DC | PRN

## 2024-08-12 MED ORDER — ONDANSETRON 4 MG PO TBDP: 4.0000 mg | ORAL_TABLET | Freq: Four times a day (QID) | ORAL | Status: DC | PRN

## 2024-08-12 MED ORDER — FENTANYL CITRATE (PF) 50 MCG/ML IJ SOLN (WRAP)
INTRAMUSCULAR | Status: AC | PRN
  Administered 2024-08-12: 25 ug via INTRAVENOUS

## 2024-08-12 MED ORDER — GLUCAGON 1 MG IJ SOLR (WRAP): 1.0000 mg | INTRAMUSCULAR | Status: DC | PRN

## 2024-08-12 MED ORDER — SENNOSIDES-DOCUSATE SODIUM 8.6-50 MG PO TABS
2.0000 | ORAL_TABLET | Freq: Every evening | ORAL | Status: DC | PRN
  Filled 2024-08-12: qty 2

## 2024-08-12 MED ORDER — NALOXONE HCL 0.4 MG/ML IJ SOLN (WRAP): 0.2000 mg | INTRAMUSCULAR | Status: DC | PRN

## 2024-08-12 MED ORDER — CARBOXYMETHYLCELLULOSE SODIUM 0.5 % OP SOLN: 1.0000 [drp] | Freq: Three times a day (TID) | OPHTHALMIC | Status: DC | PRN

## 2024-08-12 MED ORDER — BENZOCAINE-MENTHOL MT LOZG (WRAP): 1.0000 | LOZENGE | OROMUCOSAL | Status: DC | PRN

## 2024-08-12 MED ORDER — POTASSIUM CHLORIDE CRYS ER 20 MEQ PO TBCR: 0.0000 meq | EXTENDED_RELEASE_TABLET | ORAL | Status: DC | PRN

## 2024-08-12 MED ORDER — ONDANSETRON HCL 4 MG/2ML IJ SOLN
4.0000 mg | Freq: Four times a day (QID) | INTRAMUSCULAR | Status: DC | PRN
  Administered 2024-08-12: 4 mg via INTRAVENOUS
  Filled 2024-08-12: qty 2

## 2024-08-12 MED ORDER — VANCOMYCIN PHARMACY TO DOSE PLACEHOLDER: INTRAVENOUS | Status: DC

## 2024-08-12 MED ORDER — CYCLOBENZAPRINE HCL 5 MG PO TABS
10.0000 mg | ORAL_TABLET | Freq: Once | ORAL | Status: AC
  Administered 2024-08-12: 10 mg via ORAL
  Filled 2024-08-12: qty 2

## 2024-08-12 MED ORDER — LIDOCAINE HCL 1 % IJ SOLN
INTRAMUSCULAR | Status: AC | PRN
  Administered 2024-08-12: 10 mL

## 2024-08-12 MED ORDER — MAGNESIUM SULFATE IN D5W 1-5 GM/100ML-% IV SOLN
1.0000 g | INTRAVENOUS | Status: DC | PRN
  Administered 2024-08-13 (×2): 1 g via INTRAVENOUS
  Filled 2024-08-12 (×2): qty 100

## 2024-08-12 MED ORDER — BENZONATATE 100 MG PO CAPS: 100.0000 mg | ORAL_CAPSULE | Freq: Three times a day (TID) | ORAL | Status: DC | PRN

## 2024-08-12 MED ORDER — HEPARIN (PORCINE) IN NACL 2-0.9 UNIT/ML-% IJ SOLN (WRAP)
INTRAVENOUS | Status: AC
  Filled 2024-08-12: qty 500

## 2024-08-12 MED ORDER — LIDOCAINE HCL 2 % IJ SOLN
INTRAMUSCULAR | Status: AC
  Filled 2024-08-12: qty 20

## 2024-08-12 MED ORDER — SODIUM CHLORIDE 0.9 % IV BOLUS
1000.0000 mL | Freq: Once | INTRAVENOUS | Status: AC
  Administered 2024-08-12: 1000 mL via INTRAVENOUS
  Filled 2024-08-12: qty 1000

## 2024-08-12 MED ORDER — INSULIN LISPRO 100 UNIT/ML SOLN (WRAP)
1.0000 [IU] | Status: DC
  Administered 2024-08-13 (×3): 1 [IU] via SUBCUTANEOUS
  Administered 2024-08-14: 2 [IU] via SUBCUTANEOUS
  Administered 2024-08-14: 3 [IU] via SUBCUTANEOUS
  Filled 2024-08-12: qty 6
  Filled 2024-08-12 (×4): qty 3
  Filled 2024-08-12: qty 9

## 2024-08-12 MED ORDER — GLUCOSE 40 % PO GEL (WRAP): 15.0000 g | ORAL | Status: DC | PRN

## 2024-08-12 MED ORDER — POTASSIUM CHLORIDE 10 MEQ/100ML IV SOLN (WRAP): 10.0000 meq | INTRAVENOUS | Status: DC | PRN

## 2024-08-12 MED ORDER — POLYETHYLENE GLYCOL 3350 17 G PO PACK: 17.0000 g | PACK | Freq: Every day | ORAL | Status: DC | PRN

## 2024-08-12 MED ORDER — VANCOMYCIN HCL 1.25 G IV SOLR
1250.0000 mg | INTRAVENOUS | Status: DC
  Administered 2024-08-12 – 2024-08-13 (×2): 1250 mg via INTRAVENOUS
  Filled 2024-08-12 (×2): qty 1250

## 2024-08-12 MED ORDER — PANTOPRAZOLE SODIUM 40 MG IV SOLR
40.0000 mg | Freq: Every day | INTRAVENOUS | Status: DC
  Administered 2024-08-12 – 2024-08-13 (×2): 40 mg via INTRAVENOUS
  Filled 2024-08-12 (×2): qty 40

## 2024-08-12 MED ORDER — MELATONIN 3 MG PO TABS
3.0000 mg | ORAL_TABLET | Freq: Every evening | ORAL | Status: DC | PRN
  Administered 2024-08-13: 3 mg via ORAL
  Filled 2024-08-12: qty 1

## 2024-08-12 MED ORDER — POTASSIUM & SODIUM PHOSPHATES 280-160-250 MG PO PACK: 2.0000 | PACK | ORAL | Status: DC | PRN

## 2024-08-12 MED ORDER — NOREPINEPHRINE-DEXTROSE 8-5 MG/250ML-% IV SOLN (IHS)
0.0000 ug/min | INTRAVENOUS | Status: DC
  Administered 2024-08-12: 5 ug/min via INTRAVENOUS
  Filled 2024-08-12: qty 250

## 2024-08-12 MED ORDER — SODIUM CHLORIDE (PF) 0.9 % IJ SOLN
3.0000 mL | Freq: Three times a day (TID) | INTRAMUSCULAR | Status: DC
  Administered 2024-08-12: 3 mL via INTRAVENOUS
  Filled 2024-08-12: qty 10

## 2024-08-12 MED ORDER — SALINE SPRAY 0.65 % NA SOLN: 2.0000 | NASAL | Status: DC | PRN

## 2024-08-12 MED ORDER — LACTATED RINGERS IV BOLUS
1000.0000 mL | Freq: Once | INTRAVENOUS | Status: AC
  Administered 2024-08-12: 1000 mL via INTRAVENOUS
  Filled 2024-08-12: qty 1000

## 2024-08-12 MED ORDER — SENNOSIDES-DOCUSATE SODIUM 8.6-50 MG PO TABS
1.0000 | ORAL_TABLET | Freq: Two times a day (BID) | ORAL | Status: DC
  Administered 2024-08-12 – 2024-08-13 (×3): 1 via ORAL
  Filled 2024-08-12 (×4): qty 1

## 2024-08-12 MED ORDER — FENTANYL CITRATE (PF) 50 MCG/ML IJ SOLN (WRAP)
INTRAMUSCULAR | Status: AC
  Filled 2024-08-12: qty 2

## 2024-08-12 MED ORDER — ACETAMINOPHEN 500 MG PO TABS: 500.0000 mg | ORAL_TABLET | ORAL | Status: DC | PRN

## 2024-08-12 SURGICAL SUPPLY — 3 items
KIT INTRODUCER L10 CM .018 IN L40 CM STIFFEN DILATOR GUIDEWIRE COIL (Introducer) IMPLANT
KIT PERICARDIOCENTESIS L60 CM PIGTAIL 7 SPIRAL DRAINAGE HOLE CATHETER (Catheter) IMPLANT
SHEATH INTRODUCER L10 CM L2.5 CM ID6 FR SNAP ON DILATOR LOCK KINK (Sheaths) IMPLANT

## 2024-08-12 NOTE — Progress Notes (Signed)
 Case Management location: remote  Initial Case Management Assessment and Discharge Planning  [facility]   Patient Name: BRIARROSE, SHOR [facility]   Date of Birth 12-Jun-1946   Attending Physician: Delman Lonni BROCKS*   Primary Care Physician: Lyle Laymon GAILS, FNP   Length of Stay 1   Reason for Consult / Chief Complaint IDPA        Situation   Admission DX:   1. Pericardial effusion    2. Tachycardia        A/O Status: Unable to Assess    Patient admitted from: ER  Admission Status: inpatient    Health Care Agent: Self       Background     Advanced directive:   Not Received    has NO advance directive     Code Status:   Full Code     Residence: One story home/apartment    PCP: Laymon GAILS Lyle, FNP  Patient Contact:   [phone] (home)     [phone] (mobile)     Emergency contact:   Extended Emergency Contact Information  Primary Emergency Contact: Gunnison,Tahsha  Address: [address]           [place], [place] [address] [place]  Mobile Phone: [phone]  Relation: Daughter  Secondary Emergency Contact: Candelario,Tabatha  Mobile Phone: [phone]  Relation: Daughter  Interpreter needed? No      ADL/IADL's: Independent  Previous Level of function: 6 Modified Independent     DME: Single point cane    Pharmacy:     [id number] [place] [place] - [address] AT [facility] [address]  [address]  [place] [address]  Phone: [phone] Fax: [phone]    [facility] - [place], [place] - [address] [facility] AT [address] to Registered [address]  [address] [place] [address]  Phone: [phone] Fax: [phone]    [facility] [place], [place] - [address]  [address] [place] [address]  Phone: [phone] Fax: [phone]      Prescription Coverage: Yes    Home Health: The patient is not currently receiving home health services.    Previous SNF/AR: n/a    Date First IMM  given: n/a  UAI on file?: No  Transport for discharge? Mode of transportation: Sales Executive - Family/Friend to drive patient  Agreeable to Home with family post-discharge:  Yes     Assessment   CM spoke to pt's daughter via phone; face sheet verified. Pt resides in home with other daughter, Janann, and a friend. Pt is independent at baseline with use of cane as needed. Per dtr, week prior to admission, pt was in discomfort and pain, which limited mobility. Daughters to transport pt home upon d/c.   BARRIERS TO DISCHARGE: medical condition     Recommendation   D/C Plan A: Home with family    D/C Plan B: Home with home health     Nena Hallmark BSN, RN CM  RN Case Manager I  [facility]  [phone]

## 2024-08-12 NOTE — Consults (Signed)
 PICC Line Insertion Procedure Note    Brennah Quraishi    Date: 08/12/2024       Procedure:   [x]  #5.5 FR Double Lumen Arrow PICC.  []  #4.5 FR Single Lumen Arrow PICC  []  #6 FR Triple Lumen Arrow PICC    LOT #: [id number]  REF #: [id number]  Ex Date: 2024-08-12    Indications:  Vasopressors    Procedure Details   Informed consent was obtained and signed by the Patient for the procedure. Patient/Family provided with education material.   Maximum sterile technique was used:   Nurse: Special Educational Needs Teacher, Cap, Mask, Sterile Gown & Gloves        Patient: Large Sterile Head to Toe Drape        If Assistant or Observer present: Cap/Mask & clean gloves.         All syringes on the sterile field labeled.        Lidocaine  1% Local anesthetic as needed absent reported allergy.         MST w/Ultrasound and Real time directional guidance.     Insertion of #5 FR/18G Double Lumen PICC, inserted to the Left Brachial vein per hospital protocol. Tip location confirmed by ECG/VPS or Radiology, with positive blood return all lumens.  Sterile dressing applied, dated and initaled, Biopatch applied correctly.    MST GUIDEWIRE & PICC STYLET, REMOVED INTACT BY VISUAL INSPECTION.     Findings:  Insertion Site: Left Brachial vein  Catheter trimmed Total Length: 42 cm  Internal Length: 38 cm  External Length: 4 cm  Mid upper arm circumference is 30 cm.   Catheter was flushed with 10 cc NS.   Patient did tolerated procedure well.  Number of attempts: 1  Est. Blood Loss: Minimal    Confirmation:  []     ECG                   []      Chest X-Ray [x]     VPS                         Recommendations:  Ready for immediate use. Please follow all post-procedure nursing orders.       Signed ab:YDPL BILLY

## 2024-08-12 NOTE — Progress Notes (Signed)
 Initial Pharmacy Vancomycin  Dosing Consult:  Day 1    Patient Parameters  Age: 78 y.o.  Wt Readings from Last 1 Encounters:   08/12/24 68.9 kg (151 lb 14.4 oz)         Serum creatinine: 0.8 mg/dL 87/89/74 9760  Estimated creatinine clearance: 51.4 mL/min    Pertinent Cultures:   Date Source  Organism & Pertinent Susceptibilities     12/9 Blood cx pending   12/9 UA negative   12/9 Urine cx pending   12/10 MRSA cx ordered   As appropriate, contact physician to consider change in therapy if cultures grow organism other than MRSA     Assessment     Vancomycin  therapy is for empiric treatment of Sepsis (source unknown)  Vancomycin  Monitoring Goal:  AUC/MIC 400-600 - Severity: Severe   Scr and/or urine output appear stable  Will dose vancomycin  with scheduled maintenance doses without vancomycin  load    Analysis using Insight[employer] gives the following patient-specific pharmacokinetic parameters:    CL: 2.32 L/hr   V: 46.5 L   T1/2: 15.4 hours  At this time, we recommend a regimen of 1250 mg IV every 24 hours, which is predicted to result in a steady-state trough of 13.9 mg/L and AUC24 of 531 mg/L.hr.      Plan:   Start vancomycin  without load and a maintenance dose of vanco 1250mg  every 24h starting on 12/10  Vancomycin  level has been ordered for 12/13 with AM labs  SCr daily for first 3 days then reassess continued need for daily Scr monitoring  A pharmacist will continue to follow daily         Thank you,  Randine Gobble, RPh  Phone: [phone]

## 2024-08-12 NOTE — H&P (Incomplete)
 [facility]  Internal Medicine Hospitalists  History and Physical        Assessment / Plan:        Helen [name] is a 78 y.o. female with ***    ***             VTE Prophylaxis: Patient is not on DVT prophylaxis/anticoagulants/SCDs. Please order DVT prophylaxis and refresh or document contraindication ***      Foley Catheter: No Foley Present    Venous Access: No Temporary Central Line Present    Medical Readiness for Discharge: {MEDICALLY READY FOR [id number]:32264}    Open Handoff Activity in Sidebar    Additional Diagnoses:   {Disappearing Text  Patient meets BMI criteria for a weight-based diagnosis. Patient has a high BMI of 28.7.   Overweight = BMI 25-29.9 kg/m2  Class 1 Obesity = BMI 30-34.9 kg/m2  Class 2 Obesity = BMI 35-39.9 kg/m2  Class 3 Obesity = BMI 40 or above kg/m2 or 35 and above with hypertension or diabetes mellitus  reference:[id number]}  Patient has a BMI of 28.7 kg/m2    Overweight: BMI of 25 to 29.9     {Vanishing Tip  Sodium value is low  130-134 mEq/L = Mild Hyponatremia  120-129 mEq/L = Moderate Hyponatremia  <120 mEq/L = Severe Hyponatremia :[id number]}  Recent Labs     08/11/24  1934   Sodium 134*     Diagnosis: Mild Hyponatremia   {Disappearing Text  Hgb level is below the normal lab reference range.  Female Hgb <11.3 is abnormal  Female      Hgb <12.5 is abnormal. :[id number]}  Recent Labs   Lab 08/11/24  1933   Hemoglobin 10.7*   Hematocrit 34.6*   MCV 73.2*   WBC 11.18*   Platelet Count 382*     Recent Labs   Lab 05/12/24  0000   Ferritin 358*   Iron 60   TIBC 288   Iron Saturation 21     {Anemia Diagnosis:[id number]}       HPI:        Moderate pericardial effusion, likely pericarditis Clinical Information: 87F PMHx HTN, HLD, RBBB presenting 08/11/2024 with several days of palpitations and nausea. No URI sx. No abd pain. Is febrile to 101F, HR to 110s, SBP holding in 150s. Lactate negative. Pocus with moderate pericardial effusion. EKG sinus tachy with II, avf ST  depression. Initial hs trop negative. CXR without acute findings (did show large cardiac silhouette). Given Tylenol  and Zofran . Dr. Katheren Sharps (interventional cardiology) reviewed and will consult. Bed Type: Level 3 Tele          Past Medical / Surgical / Family / Social History:   {Disappearing Text  History Review Section      Please update history sections using links below and then refresh note.   Past Medical History  Surgical History  Family History  Social History              :55325}  Medical History[1]  Past Surgical History[2]   Family History[3]  Social History[4]    Allergies and Home Medications:   {Disappearing Text  Allergy and Medication Review Section    Please update history sections using links below and then refresh note.   Allergies  Medications  Immunizations                  :55325}  Allergies[5]  Home Medications       Med List Status: RN Completed  Set By: Laurann Corean Grief, RN at 08/12/2024 12:37 AM              albuterol  (PROVENTIL ) (2.5 MG/3ML) 0.083% nebulizer solution     TAKE 3 MLS (2.5 MG TOTAL) BY NEBULIZATION EVERY 4 (FOUR) HOURS AS NEEDED FOR SHORTNESS OF BREATH OR WHEEZING     albuterol  sulfate HFA (PROVENTIL ) 108 (90 Base) MCG/ACT inhaler     INHALE 2 PUFFS INTO THE LUNGS EVERY 4 HOURS AS NEEDED FOR WHEEZING OR SHORTNESS OF BREATH.     amLODIPine  (NORVASC ) 10 MG tablet     TAKE 1 TABLET (10 MG) BY MOUTH DAILY.     Patient taking differently: Take 1 tablet (10 mg) by mouth once daily     atorvastatin  (LIPITOR) 40 MG tablet     TAKE 1 TABLET BY MOUTH EVERY DAY     Patient taking differently: Take 1 tablet (40 mg) by mouth once daily     Azelastine  HCl 137 MCG/SPRAY Solution     2 SPRAYS BY NASAL ROUTE 2 (TWO) TIMES DAILY USE IN EACH NOSTRIL AS DIRECTED     Blood Glucose Monitoring Suppl (ONE TOUCH ULTRA 2) W/DEVICE KIT     One daily     cetirizine (ZyrTEC) 10 MG tablet     Take 1 tablet (10 mg) by mouth once daily     Cholecalciferol (Vitamin D3) 2000 UNIT capsule      TAKE 1 CAPSULE BY MOUTH EVERY DAY     Patient taking differently: Take by mouth once daily     dexAMETHasone (DECADRON) 0.1 % ophthalmic solution     Place 1 drop into both eyes     diazePAM  (VALIUM ) 5 MG tablet     Take 1 tablet (5 mg) by mouth every 6 (six) hours as needed (back pain)     EPINEPHrine  (EPIPEN  2-PAK) 0.3 MG/0.3ML Solution Auto-injector injection     Inject 0.3 mLs (0.3 mg) into the muscle once as needed (Anaphylaxis)     esomeprazole  (NexIUM ) 40 MG capsule     Take 1 capsule (40 mg) by mouth once daily     estradiol (ESTRACE) 0.01 % vaginal cream          ferrous sulfate 325 (65 FE) MG tablet     Take 1 tablet (325 mg) by mouth every morning with breakfast     fluticasone  (FLONASE ) 50 MCG/ACT nasal spray     2 sprays by Nasal route once daily     fluticasone  (FLOVENT  HFA) 220 MCG/ACT inhaler     INHALE 1 PUFF INTO THE LUNGS TWICE A DAY     glipiZIDE  (GLUCOTROL ) 10 MG tablet (Expired)     Take 2 tablets (20 mg) by mouth 2 (two) times daily before meals     Glucagon , rDNA, (Glucagon  Emergency) 1 MG Kit     Inject 1 mg into the skin once as needed (severe hypoglycemia)     hydrocortisone  (ANUSOL -HC) 25 MG suppository     PLACE 1 SUPPOSITORY (25 MG) RECTALLY 2 (TWO) TIMES DAILY- NOT COVERED     hydrOXYzine (ATARAX) 25 MG tablet     PLEASE SEE ATTACHED FOR DETAILED DIRECTIONS     Jardiance  25 MG tablet     TAKE 1 TABLET BY MOUTH EVERY DAY IN THE MORNING     Lancets (onetouch ultrasoft) lancets     Use to check blood sugars 2 times daily     lidocaine  (Lidoderm ) 5 %     Place  1 patch onto the skin once daily as needed (pain) Remove & Discard patch within 12 hours     linaGLIPtin  (Tradjenta ) 5 MG Tablet     TAKE 1 TABLET BY MOUTH EVERY DAY     Patient taking differently: Take 1 tablet (5 mg) by mouth once daily     meloxicam  (Mobic ) 7.5 MG tablet     Take 1 tablet (7.5 mg) by mouth once daily as needed for Pain     metFORMIN  (GLUCOPHAGE -XR) 500 MG 24 hr tablet     TAKE 2 TABLETS (1,000 MG) BY MOUTH  TWICE A DAY     montelukast (SINGULAIR) 10 MG tablet     Take 1 tablet (10 mg) by mouth once daily     NASONEX 50 MCG/ACT nasal spray     2 sprays by Nasal route once daily as needed     ondansetron  (ZOFRAN -ODT) 4 MG disintegrating tablet     Dissolve 1 tablet (4 mg) in the mouth every 6 (six) hours as needed for Nausea     OneTouch Ultra test strip     TWICE A DAY     valsartan -hydroCHLOROthiazide  (DIOVAN -HCT) 160-12.5 MG per tablet     TAKE 1 TABLET BY MOUTH EVERY DAY     Patient taking differently: Take 1 tablet by mouth once daily            Review of Systems:   Per HPI        Objective:      Patient Vitals for the past 24 hrs:   BP Temp Temp src Pulse Resp SpO2 Height Weight   08/12/24 0120 -- -- -- 96 18 97 % -- --   08/12/24 0117 -- -- -- 98 21 95 % -- --   08/12/24 0005 125/59 98.3 F (36.8 C) -- 98 19 97 % 1.549 m (5' 1) 68.9 kg (151 lb 14.4 oz)   08/12/24 0000 125/62 -- -- (!) 102 19 97 % -- --   08/11/24 2358 -- -- -- 100 17 97 % -- --   08/11/24 2353 125/59 98.3 F (36.8 C) Oral 100 17 97 % -- 68.9 kg (151 lb 14.4 oz)        General: awake, alert, oriented x 3; no acute distress  HEENT: anicteric sclerae, PERRL, EOMI; MMM  Cardiovascular: regular rate and rhythm; no murmurs, rubs, or gallops  Lungs: clear to auscultation bilaterally without wheezing, rhonchi, or rales  Abdomen: soft, non-distended; non-tender to palpation, no rebound or guarding  Extremities: warm; no LE edema; no clubbing or cyanosis  Neuro: symmetric facial movements, clear speech, moving all extremities         Time Spent:         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    Bilateral cataracts     Breast cancer (CMS/HCC)     Breast lump     Cancer (CMS/HCC)     breast cancer    Carcinoma (CMS/HCC)      Carcinoma of the bladder    Chicken pox     Childhood illness  Conduction Disorder     RBBB    Diabetes mellitus type II     Non-Insulin  dependent    Echocardiogram 10/2013    Gastroesophageal reflux disease     Heart murmur     Hemorrhoids without complication     Holter monitor 02/2014    Hyperlipidemia     Hypertensive disorder     Malignant neoplasm of breast (CMS/HCC)     Measles     Childhood illness    Mumps     Childhood illness    Myocardial perfusion scan 11/2013, 12/2013    MPI single Isotope excrise; MPI dual Isotope Lexiscan     Rash     RBBB     Spinal stenosis     Stage 2 chronic kidney disease 10/29/2023    Syncope    [2]   Past Surgical History:  Procedure Laterality Date    ABLATION OF DYSRHYTHMIC FOCUS      ARTHROSCOPY, KNEE Left 04/21/2015    Procedure: ARTHROSCOPY, KNEE PARTIAL MEDIAL AND LATERAL MENISECTOMY LEFT KNEE;  Surgeon: Evie Shlomo CROME, MD;  Location: [facility];  Service: Orthopedics;  Laterality: Left;  partial medial menisectomy    BREAST BIOPSY      COLONOSCOPY, SCREENING N/A 12/07/2022    Procedure: COLONOSCOPY, SCREENING;  Surgeon: Vernia Mabel HERO, MD;  Location: [facility];  Service: Gastroenterology;  Laterality: N/A;    EGD N/A 12/07/2022    Procedure: EGD;  Surgeon: Vernia Mabel HERO, MD;  Location: [facility];  Service: Gastroenterology;  Laterality: N/A;    HAND SURGERY      HYSTERECTOMY      total    KNEE SURGERY      TUMOR REMOVAL Left     neck  benign    [3]   Family History  Problem Relation Name Age of Onset    Diabetes Mother Brelyn Woehl     Diabetes Sister Lincey Clyburn     Diabetes Sister Reena Oris     Malignant hyperthermia Neg Hx      Pseudochol deficiency Neg Hx      Anesthesia problems Neg Hx     [4]   Social History  Tobacco Use    Smoking status: Never     Passive exposure: Never    Smokeless tobacco: Never   Vaping Use    Vaping status: Never Used   Substance Use Topics    Alcohol use: No    Drug use: Never   [5]   Allergies  Allergen Reactions     Aspirin      Break out in sweat and dizziness     Other Environmental     Peppers     Pollen Extract     Latex Itching and Rash

## 2024-08-12 NOTE — Nursing Progress Note (Signed)
 9384 RRT-This RN noticed patient BP dropped into the 70s. BP was checked several times but keeps dropping. BP in the 70s consistently. RRT and Dr. Sueellen at bedside, second bolus and stat echo ordered. Compazine  ordered for emesis. Patient to be transfer to ICU.

## 2024-08-12 NOTE — Treatment Plan (Signed)
 4 eyes in 4 hours pressure injury assessment note:      Completed with:   Unit & Time admitted:              Bony Prominences: Check appropriate box; if Pressure Injury is present enter Pressure Injury assessment in LDA    Occiput:                    []  Pressure Injury present  Face:                        []  Pressure Injury present  Ears:                         []  Pressure Injury present  Spine:                       []  Pressure Injury present  Shoulders:                []  Pressure Injury present  Elbows:                     []  Pressure Injury present  Sacrum/coccyx:        []  Pressure Injury present  Ischial Tuberosity:    []  Pressure Injury present  Trochanter/Hip:        []  Pressure Injury present  Knees:                      []  Pressure Injury present  Ankles:                     []  Pressure Injury present  Heels:                       []  Pressure Injury present  Other pressure areas: []  Pressure Injury location       Device related: []  Device name:         LDA completed if pressure injury present: yes/no  Consult WOCN if necessary    Other skin related issues, ie tears, rash, etc, document in Integumentary flowsheet

## 2024-08-12 NOTE — Interval H&P Note (Signed)
 H&P UPDATE WITH ASA/MALLAMPATTI    Date Time: 08/12/2024 9:24 AM    PROCEDURE:    Pericardiocentesis  INDICATIONS:    pericardial effusion  H&P:    The history and physical including past medical, family, and social history were reviewed   and there are no significant interval changes from what is currently available in the chart  from prior evaluation.   She was seen and examined by me prior   to the procedure.     Called emergently to the cath lab for pericardiocentesis given patient has a large pericardial effusion and clinical signs of tamponade (tachycardia and hypotension).     Verbal Consent was obtained.           ALLERGIES:    Aspirin, Other environmental, Peppers, Pollen extract, and Latex   LABS:      Lab Results   Component Value Date    WBC 10.43 (H) 08/12/2024    HGB 10.1 (L) 08/12/2024    HCT 31.4 (L) 08/12/2024    PLT 352 (H) 08/12/2024    NA 139 08/12/2024    K 4.0 08/12/2024    CL 102 08/12/2024    CO2 21 08/12/2024    MG 1.8 08/11/2024    BUN 12 08/12/2024    CREAT 0.8 08/12/2024    EGFR >60.0 08/12/2024    GLU 92 08/12/2024          ASA PHYSICAL STATUS    Class 3 - Severe systemic disease, limits normal activity but not incapacitating  MALLAMPATTI AIRWAY CLASSIFICATION    Class II: Visibility of hard and soft palate, upper portion of tonsils and uvula  PLANNED SEDATION:    ( ) NO SEDATION   (x) MODERATE SEDATION   ( ) DEEP SEDATION WITH ANESTHESIA   CONCLUSION:    The risks, benefits and alternatives of the procedure have been discussed in detail and   she has indicated that she understands the procedure, indications, and risks inherent to the   procedure and is amenable to proceeding.  All questions were answered. Informed   consent was signed and verified.      Signed by: Quincy Boy Q Azir Muzyka, DO

## 2024-08-12 NOTE — Plan of Care (Signed)
 NURSING SHIFT NOTE     Patient: Helen [name]  Day: 1      SHIFT EVENTS     Shift Narrative/Significant Events (PRN med administration, fall, RRT, etc.):     Patient BP dropped in the 70-90s, and had 3 episodes of emesis, Dr. Sueellen was notified. Compazine  and bolus ordered. One time flexeril  given for back pain with some relief. Zofran  given for nausea. Patient on continuous iv fluid. Patient NPO. Plan of care ongoing. Safety and fall precautions remain in place. Purposeful rounding maintained.           ASSESSMENT     Changes in assessment from patient's baseline this shift:    Neuro: No  CV: No  Pulm: No  Peripheral Vascular: No  HEENT: No  GI: No  BM during shift: No, Last BM:    GU: No   Integ: No  MS: No    Pain: Improved  Pain Interventions: Medications, Rest, and Positioning  Medications Utilized: Flexeril  oral    Mobility: PMP Activity: Step 4 - Dangle at Bedside of             Lines     Patient Lines/Drains/Airways Status       Active Lines, Drains and Airways       Name Placement date Placement time Site Days    Peripheral IV 08/11/24 18 G Anterior;Left Antecubital 08/11/24  2000  Antecubital  less than 1    External Urinary Catheter 08/11/24  2203  --  less than 1                         VITAL SIGNS     Vitals:    08/12/24 0445   BP: 97/52   Pulse: 92   Resp: (!) 45   Temp:    SpO2: 95%       Temp  Min: 97.9 F (36.6 C)  Max: 101.1 F (38.4 C)  Pulse  Min: 92  Max: 140  Resp  Min: 17  Max: 45  BP  Min: 79/53  Max: 169/80  SpO2  Min: 95 %  Max: 98 %      Intake/Output Summary (Last 24 hours) at 08/12/2024 0533  Last data filed at 08/12/2024 0400  Gross per 24 hour   Intake 50 ml   Output 800 ml   Net -750 ml          CARE PLAN       Problem: Hemodynamic Status: Cardiac  Goal: Stable vital signs and fluid balance  Outcome: Progressing  Flowsheets (Taken 08/12/2024 0521)  Stable vital signs and fluid balance:   Assess signs and symptoms associated with cardiac rhythm changes   Monitor lab values      Problem: Inadequate Tissue Perfusion  Goal: Adequate tissue perfusion will be maintained  Outcome: Progressing  Flowsheets (Taken 08/12/2024 0521)  Adequate tissue perfusion will be maintained:   Monitor/assess lab values and report abnormal values   Monitor/assess neurovascular status (pulses, capillary refill, pain, paresthesia, paralysis, presence of edema)   Monitor/assess for signs of VTE (edema of calf/thigh redness, pain)   Monitor for signs and symptoms of a pulmonary embolism (dyspnea, tachypnea, tachycardia, confusion)   Encourage/assist patient as needed to turn, cough, and perform deep breathing every 2 hours     Problem: Altered GI Function  Goal: Fluid and electrolyte balance are achieved/maintained  Outcome: Progressing  Flowsheets (Taken 08/12/2024 0521)  Fluid and electrolyte  balance are achieved/maintained:   Monitor/assess lab values and report abnormal values   Assess and reassess fluid and electrolyte status   Observe for cardiac arrhythmias   Monitor for muscle weakness     Problem: Compromised Activity/Mobility  Goal: Activity/Mobility Interventions  Outcome: Progressing  Flowsheets (Taken 08/12/2024 0000)  Activity/Mobility Interventions: Pad bony prominences, TAP Seated positioning system when OOB, Promote PMP, Reposition q 2 hrs / turn clock, Offload heels     Problem: Pain interferes with ability to perform ADL  Goal: Pain at adequate level as identified by patient  Outcome: Progressing  Flowsheets (Taken 08/12/2024 0521)  Pain at adequate level as identified by patient:   Assess for risk of opioid induced respiratory depression, including snoring/sleep apnea. Alert healthcare team of risk factors identified.   Assess pain on admission, during daily assessment and/or before any as needed intervention(s)   Identify patient comfort function goal   Reassess pain within 30-60 minutes of any procedure/intervention, per Pain Assessment, Intervention, Reassessment (AIR) Cycle   Evaluate if  patient comfort function goal is met   Offer non-pharmacological pain management interventions   Evaluate patient's satisfaction with pain management progress   Consult/collaborate with Pain Service

## 2024-08-12 NOTE — Consults (Signed)
 [place]  HEART CARDIOLOGY CONSULTATION REPORT    [facility]  MED SURG ICU 1  [address]  [place] [address]  Dept: [phone]  Loc: [phone]      Date Time: 08/12/2024 2:54 PM  Patient Name: Helen [name]  Requesting Physician: Delman Lonni BROCKS*    Reason for Consultation:   Pericardial effusion    History of Present Illness:   Helen [name] is a 78 y.o. female admitted on 08/11/2024.  We have been asked by Delman Lonni BROCKS* to provide cardiac consultation regarding pericardial effusion.    The patient presented for medical assessment in the setting of rapid heart rate.  She was found to be febrile and a CT of the chest, abdomen and pelvis noted large pericardial effusion, which was confirmed by echocardiography.  However, echo did not suggest tamponade.  Patient went for pericardiocentesis this morning and had approximately 470 cc of hemorrhagic fluid drained and was sent for studies.    The patient has remote history of breast cancer.  She remains on 5 mcg of Levophed , but feels improved clinically.    Past Medical/Surgical History:   Patient  has a past medical history of Anemia, Arthritis, Arthritis, Asthma, Asthma without status asthmaticus, Atrioventricular conduction disorder, Bilateral cataracts, Breast cancer (CMS/HCC), Breast lump, Cancer (CMS/HCC), Carcinoma (CMS/HCC), Chicken pox, Conduction Disorder, Diabetes mellitus type II, Echocardiogram (10/2013), Gastroesophageal reflux disease, Heart murmur, Hemorrhoids without complication, Holter monitor (02/2014), Hyperlipidemia, Hypertensive disorder, Malignant neoplasm of breast (CMS/HCC), Measles, Mumps, Myocardial perfusion scan (11/2013, 12/2013), Rash, RBBB, Spinal stenosis, 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    Patient  has a past surgical history that includes Breast biopsy; Hysterectomy; Tumor removal (Left); ARTHROSCOPY, KNEE (Left, 04/21/2015); Ablation of dysrhythmic focus;  EGD (N/A, 12/07/2022); COLONOSCOPY, SCREENING (N/A, 12/07/2022); Hand surgery; and Knee surgery.    Family History:   Patient's family history includes Diabetes in her mother, sister, and sister.    Social History:   Patient  reports that she has never smoked. She has never been exposed to tobacco smoke. She has never used smokeless tobacco. She reports that she does not drink alcohol and does not use drugs.    Allergies:   Allergies[1]    Medications:     No current outpatient medications relevant to Cardiology on file.         Inpatient Scheduled Meds: PRN Meds:    insulin  lispro, 1-5 Units, Subcutaneous, Q4H SCH  pantoprazole , 40 mg, Intravenous, Daily  piperacillin -tazobactam, 4.5 g, Intravenous, Q6H  senna-docusate, 1 tablet, Oral, Q12H SCH  vancomycin , 1,250 mg, Intravenous, Q24H  vancomycin , , Intravenous, See Admin Instructions        Continuous Infusions:   sodium chloride  150 mL/hr at 08/12/24 1400    norepinephrine  4 mcg/min (08/12/24 1430)    acetaminophen , 500 mg, Q4H PRN  benzocaine -menthol , 1 lozenge, Q2H PRN  benzonatate , 100 mg, TID PRN  carboxymethylcellulose sodium, 1 drop, TID PRN  dextrose , 15 g of glucose, PRN   Or  dextrose , 12.5 g, PRN   Or  dextrose , 12.5 g, PRN   Or  glucagon  (rDNA), 1 mg, PRN  magnesium  sulfate, 1 g, PRN  melatonin, 3 mg, QHS PRN  naloxone , 0.2 mg, PRN  ondansetron , 4 mg, Q6H PRN   Or  ondansetron , 4 mg, Q6H PRN  polyethylene glycol, 17 g, Daily PRN  potassium & sodium phosphates , 2 packet, PRN  potassium chloride , 0-60 mEq, PRN   Or  potassium chloride , 0-60 mEq, PRN  Or  potassium chloride , 10 mEq, PRN  saline, 2 spray, Q4H PRN  senna-docusate, 2 tablet, QHS PRN          Physical Exam:     Vitals:    08/12/24 1305 08/12/24 1351 08/12/24 1400 08/12/24 1430   BP: (!) 88/51 91/56 94/49  95/48   Pulse:   (!) 108    Resp:   (!) 23    Temp:       TempSrc:       SpO2:   97%    Weight:       Height:           Constitutional: Cooperative, alert, no acute distress.  Neck: JVP  normal.  Cardiac: Regular rate and rhythm, normal S1 and S2; no S3 or S4, no murmurs, no rubs, no gallops. Pericardial drain in place with hemorrhagic output.  Pulmonary: Clear to auscultation bilaterally, no wheezing, no rhonchi, no rales.  Extremities: no edema.  Vascular: +2 pulses in radial artery bilaterally.    Lab/Test Findings Reviewed:     ECG reviewed: Sinus tachycardia with RBBB  CT CAP:  1.Large pericardial effusion. Mild enhancement of the pericardium, which  may reflect pericarditis.  2.No evidence of aortic dissection.  3.Cholelithiasis.  4.Colonic diverticulosis.    Recent Labs   Lab 08/12/24  0239 08/11/24  1945   hs Troponin 5.8 3.9             Recent Labs   Lab 08/11/24  1934   Bilirubin, Total 0.9   Protein, Total 8.0   Albumin 3.3*   ALT 15   AST (SGOT) 31     Recent Labs   Lab 08/11/24  1934   Magnesium  1.8     Recent Labs   Lab 08/12/24  0239   PT 14.1*   INR 1.3*     Recent Labs   Lab 08/12/24  0239 08/11/24  1933   WBC 10.43* 11.18*   Hemoglobin 10.1* 10.7*   Hematocrit 31.4* 34.6*   Platelet Count 352* 382*     Recent Labs   Lab 08/12/24  0239 08/11/24  1934   Sodium 139 134*   Potassium 4.0 3.8   Chloride 102 96*   CO2 21 23   BUN 12 14   Creatinine 0.8 0.9   GFR >60.0 >60.0   Glucose 92 109*   Calcium  8.7 8.7           Invalid input(s): FREET4   No results found for: NTPROBNP    ASSESSMENT:   Patient is a 78 y.o. female with the following relevant diagnoses:    Hemorrhagic pericardial effusion s/p pericardiocentesis. CT chest abdomen and pelvis unrevealing for possible active malignancy.  History of breast CA 28 years ago.  Type 2 diabetes      RECOMMENDATIONS:     Maintain pericardial drain. Consider pulling drain when fluid is < 25cc/day.  Start colchicine  0.6mg  daily.  Await fluid studies. Hemorrhagic fluid raises concern for malignancy (prior history of breast Ca, but lymphoma can also present this way). Differential for hemorrhagic effusion also includes TB, but I find this quite  unlikely. If fluid studies are inconclusive, we may need to think about pericardial biopsy to increase yield on path/histology.  Check TSH.  Liberal administration of IV fluids.      Recommendations discussed with primary medical team.  -------------------------------------------------------------------------------------  Signed by:         Dorn LILLETTE Calender, MD  [place]  Heart    [place]  Heart Contact Information   [facility]  Secure Chat (Group):   [facility] [place] [facility]    APP Spectralink:  [phone]    MD Spectralink :  [phone]  [phone]    After hours, non urgent consult line:  [phone]    After hours, physician on-call:  [phone] [facility]  Secure Chat (Group):   [place] [facility]    APP Spectralink:  [phone]    MD Spectralink :  [phone]      After hours, non urgent consult line:  [phone]    After hours, physician on-call:  [phone] [facility]  Secure Chat (Group):   [facility] [place] [facility]    APP Spectralink:  [phone]    MD Spectralink :  [phone]      After hours, non urgent consult line:  [phone]    After hours, physician on-call:  [phone] [facility]  Secure Chat (Group):   [place]  Heart    APP Spectralink:  [phone]    MD Spectralink :  [phone]      After hours, non urgent consult line:  [phone]    After hours, physician on-call:  [phone]       Please pardon any potential grammatical or typographical errors as aspects of this note may have been created through speech-to-text software.         [1]   Allergies  Allergen Reactions    Aspirin      Break out in sweat and dizziness     Other Environmental     Peppers     Pollen Extract     Latex Itching and Rash

## 2024-08-12 NOTE — Procedures (Addendum)
 PERICARDIOCENTESIS    Date Time: 08/12/2024 10:59 AM    Patient Name:   Helen [name]    Date of Operation:   08/12/2024    Providers Performing:   Surgeon(s):  Ifeanyichukwu Wickham Q, DO    Assistant (s):   Scrub Person: French, Designer, Jewellery - Equipment: Elicia Gong, RN  Circulator - Medication: Faustino Bouchard, RN    Operative Procedure:   Procedure(s):  Pericardiocentesis    Preoperative Diagnosis:   Large pericardial effusion     Postoperative Diagnosis:     Successful echocardiographic guided pericardiocentesis  Removal of 470 cc's of bloody pericardial fluid     Procedure details:   The patient was brought to the cardiac cath lab urgently due to large pericardial effusion and hemodynamics c/w tamponade (hypotension and tachycardia).   The subxyphoid region was injected with 10 cc's of lidiocaine. Using a micropuncture technique along with echocardiographic and  flouroscopic guidance, a 6 F slender sheath was advance into the pericardium over a long guidewire. The pigtail catheter was advanced and positioned in the pericardial space. 470 cc's of bloody fluid was aspirated with resolution of pericardial effusion on echo. The pigtail catheter was left in place on suction.         Anesthesia:   Conscious Sedation  Under my direct supervision, 0.5 mg Versed  and 25 mcg were administered IV. Her heart rate, pulse ox and blood pressure were continuously monitored by an independent trained observer.   Start time 10: 15 AM  End time   10 : 55 AM    Drains:   Drains: Yes, Drain #1: Pericardial      Complications:   None        Signed by: Cadyn Fann Q Aster Screws, DO                                                                             [facility]

## 2024-08-12 NOTE — Progress Notes (Signed)
 [facility] Clinical [facility]  Nutrition Assessment    Patient: Helen [name] 78 y.o. female  MRN: [id number]  Location/Room: A2920/A2920-01  Isolation Status: No active isolations    Reason for Assessment: MST 3// unsure wt loss + loss of appetite     Nutrition Plan and Recommendations:     Interventions:   Advance diet per MD, goal of regular   Monitor GI sx   Check weight weekly    Goals:   Pt to have nutrition by RD fu - new     Assessment Data:     Hospital Admission: From H&P: Helen [name] is a 78 y.o. female with PMHx of DM 2, HTN, HLD, breast cancer, bladder carcinoma, RBBB, CKD stage II, spinal stenosis, multiple viral illnesses in childhood (measles, mumps, chickenpox), who presented to ED on 08/11/2024 with progressive palpitations, nausea and decreased appetite.     Nutrition Overview: Pt seen in room at bedside. Pt reports a poor appetite for 3 days PTA. Pt reports she was still eating but about half of her normal intake. Pt denies current N/v/C/D. Pt reports prior to the 3 days she eats very well, consuming 3 meals per day. Pt reports UBW around 150#. 2 separate weights taken today and patient noted to have lost weight. Suspect weight is fluid r/t from pericardiocentesis this morning per chart review. Will continue to trend weights. NFPE with minimal findings, pt denies noticing any changes in muscle mass. 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 NKFA. Denies chewing/ swallowing difficulties.     History:  Medical History[1]  Past Surgical History[2]    Medications:   Scheduled Medications[3]   Infusion Meds[4]  Pertinent PRN meds (given in the past 48 hours): versed , zofran       Labs:   Recent Labs   Lab 08/12/24  0239 08/11/24  1934 08/11/24  1933   Sodium 139 134*  --    Potassium 4.0 3.8  --    Chloride 102 96*  --    CO2 21 23  --    BUN 12 14  --    Creatinine 0.8 0.9  --    Glucose 92 109*  --    Calcium  8.7 8.7  --    Magnesium   --  1.8  --    GFR >60.0 >60.0  --    WBC  10.43*  --  11.18*   Hematocrit 31.4*  --  34.6*   Hemoglobin 10.1*  --  10.7*   Lipase  --  25  --    Albumin  --  3.3*  --      Recent Labs   Lab 08/12/24  1145 08/12/24  0829 08/12/24  0618   Whole Blood Glucose POCT 138* 123* 114*     Diet/Intake:   Orders Placed This Encounter   Procedures    Diet NPO effective now     Intake: NPO     Allergies[5]    Nutrition Focused Physical Exam: 12/10   Head: no overt s/s subcutaneous muscle or fat loss  Upper Body: clavicle bone region: some protrusion of the clavicle with decrease in muscle tone/resistance (mild muscle loss - pectoralis major), shoulder and Acromion bone region: rounded, curves at shoulder/arm juncture, feel muscle tone/resistance (no wasting observed), upper arm region: ample fat tissue between fingers (no wasting observed - triceps), and dorsal hand region: slight depression, decrease in muscle tone/resistance (mild muscle loss - interosseous)  Lower Body: no s/s subcutaneous fat  or muscle loss  Edema: no sign of fluid accumulation   Skin: intact   GI Function: Last BM Date: 08/12/24,     Anthropometrics:   Height: 154.9 cm (5' 1)  Weight: 65.7 kg (144 lb 13.5 oz)  Body mass index is 27.37 kg/m.   Weight Monitoring     Weight Weight Method   05/12/2024 70.852 kg     05/21/2024 71.668 kg     07/20/2024 69.854 kg     07/25/2024 70.761 kg     08/11/2024 69.854 kg  Bed Scale     68.9 kg     08/12/2024 68.9 kg  Bed Scale     65.7 kg       Suspect wt loss within same day is fluid related, will not use in PES at this time- will continue to monitor weight trends for accuracy     Estimated Needs:   Total Estimated Energy Needs:1314 to 1642.5 kcal  Method for Calculating Energy Needs: 20 kcal - 25 kcal per kg  at 65.7 kg (Actual body weight)    Total Estimated Protein Needs:78.84 to 98.55 g  Method for Calculating Protein Needs: 1.2 g - 1.5 g per kg at 65.7 kg (Actual body weight)    Total Estimated Fluid Needs:1314 to 1642.5 ml  Method for Calculating Fluid Needs:  1 ml per kcal energy = 1314 to 1642.5 kcal    Education Provided: Discussed reasoning for NPO status at this time. Encouraged intake once diet advances     Nutrition Diagnosis:     Inadequate Energy Intake related to loss of appetite/ nausea as evidenced by pt reports of consuming about 50% of normal intake for 3 days. - new     Pt at risk for malnutrition, continue to monitor for 2 qualifying criteria     Monitoring/Evaluation:   Diet advancements  Weight       Nutritional Risk:  High (will follow up at least 2 times per week and PRN)      Isaiah Pizza MS, RD  Clinical Dietitian  Spectralink [phone]        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    Bilateral cataracts     Breast cancer (CMS/HCC)     Breast lump     Cancer (CMS/HCC)     breast cancer    Carcinoma (CMS/HCC)     Carcinoma of the bladder    Chicken pox     Childhood illness    Conduction Disorder     RBBB    Diabetes mellitus type II     Non-Insulin  dependent    Echocardiogram 10/2013    Gastroesophageal reflux disease     Heart murmur     Hemorrhoids without complication     Holter monitor 02/2014    Hyperlipidemia     Hypertensive disorder     Malignant neoplasm of breast (CMS/HCC)     Measles     Childhood illness    Mumps     Childhood illness    Myocardial perfusion scan 11/2013, 12/2013    MPI single Isotope  excrise; MPI dual Isotope Lexiscan     Rash     RBBB     Spinal stenosis     Stage 2 chronic kidney disease 10/29/2023    Syncope    [2]   Past Surgical History:  Procedure Laterality Date    ABLATION OF DYSRHYTHMIC FOCUS      ARTHROSCOPY, KNEE Left 04/21/2015    Procedure: ARTHROSCOPY, KNEE PARTIAL MEDIAL AND LATERAL MENISECTOMY LEFT KNEE;  Surgeon: Evie Shlomo CROME, MD;  Location: [facility];  Service: Orthopedics;   Laterality: Left;  partial medial menisectomy    BREAST BIOPSY      COLONOSCOPY, SCREENING N/A 12/07/2022    Procedure: COLONOSCOPY, SCREENING;  Surgeon: Vernia Mabel HERO, MD;  Location: [facility];  Service: Gastroenterology;  Laterality: N/A;    EGD N/A 12/07/2022    Procedure: EGD;  Surgeon: Vernia Mabel HERO, MD;  Location: [facility];  Service: Gastroenterology;  Laterality: N/A;    HAND SURGERY      HYSTERECTOMY      total    KNEE SURGERY      TUMOR REMOVAL Left     neck  benign    [3]   Current Facility-Administered Medications   Medication Dose    insulin  lispro  1-5 Units    pantoprazole   40 mg    piperacillin -tazobactam  4.5 g    senna-docusate  1 tablet    vancomycin   1,250 mg    vancomycin      [4]   Current Facility-Administered Medications   Medication Dose Route Frequency Last Rate    sodium chloride    Intravenous Continuous 150 mL/hr at 08/12/24 1400    norepinephrine   0-20 mcg/min Intravenous Continuous 4 mcg/min (08/12/24 1430)   [5]   Allergies  Allergen Reactions    Aspirin      Break out in sweat and dizziness     Other Environmental     Peppers     Pollen Extract     Latex Itching and Rash

## 2024-08-12 NOTE — H&P (Signed)
 [facility] ICU  History and Physical      Patient Name: Helen [name]  MRN: [id number]  Room: J7196/J7196-J  Code Status: Full Code    Chief Complaint / Primary Reason for MSICU Evaluation   Hypotension / pericardial effusion    History of Presenting Illness   Shambhavi Salley is a 78 y.o. female with PMHx of DM 2, HTN, HLD, breast cancer, bladder carcinoma, RBBB, CKD stage II, spinal stenosis, multiple viral illnesses in childhood (measles, mumps, chickenpox), who presented to ED on 08/11/2024 with progressive palpitations, nausea and decreased appetite.  In ED she was noted to be febrile with temperature 101 F, and sinus tachycardia with heart rate 110s to 120s, normal respiratory rate and oxygenating adequately on room air, GCS 15.  Chest x-ray revealed marked cardiomegaly concerning for pericardial effusion.  BMP and LFTs were benign.  Anion gap was 16 and lactate was normal at 1.4.  CBC notable for mild leukocytosis and anemia with WBC 10.4 K, hemoglobin 10.1, platelets mildly elevated.  COVID and flu swabs are negative.  Urinalysis showed glucosuria and mild ketonuria with no evidence of infection.  CTA of the chest abdomen pelvis revealed a large pericardial effusion with findings suggestive of pericarditis, trace left pleural fluid, postsurgical changes of the right breast, incidental finding of cholelithiasis and colonic diverticulosis, no evidence of aortic dissection.  Cardiology was consulted and a stat echocardiogram was ordered.  [facility] was consulted and the patient was accepted for admission to the ICU.  Patient seen and evaluated on unit 28 with echocardiogram underway, confirming the presence of a moderate pericardial effusion, formal read pending.  Patient noted to be tachycardic with heart rate 120s to 130s, hypotensive with SBP in the 70s, improved to 80s after 1 L NS bolus.  1 L of LR bolus was ordered and the patient was started on broad-spectrum antimicrobial coverage by [facility].   She denies chest pain, dyspnea, dizziness, or lightheadedness.  She endorses nausea and had an episode of vomiting earlier for which she received Compazine  x 2.    Subjective   Past Medical History:   Medical History[1]    Past Surgical History:   Past Surgical History[2]    Family History:   Family History[3]    Social History:   Social History[4]    Allergies:   Allergies[5]       Objective   Physical Examination   Vitals Temp:  [97.9 F (36.6 C)-101.1 F (38.4 C)]   Heart Rate:  [92-140]   Resp Rate:  [17-45]   BP: (70-169)/(43-84)   SpO2:  [94 %-98 %]   Height:  [154.9 cm (5' 1)]   Weight:  [68.9 kg (151 lb 14.4 oz)-69.9 kg (154 lb)]   BMI (calculated):  [28.7-29.1]  // Temperature with 24 range  Vent Settings      General: African-American woman, appears fatigued, otherwise in no acute distress, getting echocardiogram    Neuro:    Drowsy but rousable to voice, GCS 14, alert and oriented x 3.    Lungs:   clear to auscultation, no wheezes, rales or rhonchi, symmetric air entry, no tachypnea, retractions or cyanosis     Cardiac:   Distant heart sounds, regular tachycardia with no GMR, moderate JVD present    Abdomen:    soft, nontender, nondistended, no masses or organomegaly  bowel sounds normal     Extremities:   peripheral pulses normal no pedal edema no clubbing or cyanosis no ulcers, gangrene or  atrophic changes    Skin:     Warm, dry and intact    Assessment   Helen [name] is a 78 y.o. female who presents with pericardial effusion with concern for tamponade.        Patient has a BMI of 28.7 kg/m2    Overweight: BMI of 25 to 29.9       Recent Labs     08/12/24  0239 08/11/24  1934   Sodium 139 134*     Diagnosis: Mild Hyponatremia     Recent Labs   Lab 08/12/24  0239 08/11/24  1933   Hemoglobin 10.1* 10.7*   Hematocrit 31.4* 34.6*   MCV 70.2* 73.2*   WBC 10.43* 11.18*   Platelet Count 352* 382*     Recent Labs   Lab 05/12/24  0000   Ferritin 358*   Iron 60   TIBC 288   Iron Saturation 21     Anemia  Diagnosis: Chronic: Anemia of Chronic Disease     Laboratory Evidence of Acidosis:  Recent Labs   Lab 08/12/24  0239 08/11/24  1934   CO2 21 23        Acidosis Diagnosis: Metabolic Acidosis                         Neuro   None    Cardiovascular   Shock  Pericardial effusion, r/o tamponade    Pulmonary  None    Renal      Gastroenterology     Infections Disease  Sepsis    Hematology  None (you can delete this heading)    Endo/Rheum  None    ICU Checklist  Sedation:   CAM-ICU:     CAM ICU:   Last Documented RASS:     Currently ordered infusions:    sodium chloride       Reviewed: Yes   Mobility:   Current Mobility Level: PMP Activity: Step 4 - Dangle at Bedside (08/12/2024 12:00 AM)    Current PT Order: No  Current OT Order: No Reviewed: Yes   Respiratory (n/a if blank):   Ventilator Time:    Last Recorded Vent Mode:    Reviewed: Yes   Gastrointenstinal  Last Bowel Movement:   No data recorded Reviewed: Yes   CAUTI Prevention (n/a if blank):  Foley Day:        Reviewed: Yes   Blood Stream Infection Prevention (n/a if blank):    Reviewed: Yes   DVT Chemoprophylaxis (none if blank):    Reviewed: Yes            Plan   Dispo:  Admit to ICU    NEUROLOGICAL:  - Oriented x 3, drowsy but arousable, no acute issues  - Tylenol  as needed for pain  Delirium precautions including maintenance of day/night wake cycles, frequent reorientation as needed   Physical therapy/Occupational Therapy/Out of bed to chair, as indicated/tolerated    CARDIOVASCULAR:   #Pericardial effusion, rule out tamponade  #History of HTN and HLD  #Chronic RBBB  - Second crystalloid fluid bolus ordered now  - Broad-spectrum antibiotics as detailed below  - Differential includes malignant versus infectious pericardial effusion  - ECG reveals sinus tachycardia with known RBBB, no ischemic changes.  Troponins negative.  No evidence to suggest ACS.  - Stat echo in progress and pending  - Hold home antihypertensives and statin for now  - Cardiology consulted.  Will  keep n.p.o. in  preparation for potential pericardiocentesis.  - Goal MAP >65, low threshold for vasopressors if hypotension proves fluid refractory    PULMONARY:   -No acute issues  - Oxygenating well on room air  - Chest x-ray with cardiomegaly and no pulmonary infiltrates    RENAL:   #CKD stage II  - Electrolytes benign, creatinine normal  - Replace electrolytes as needed per protocol, Daily BMP  - Monitor urine output    GASTROINTESTINAL:  #History of GERD  - At risk for constipation  - Nutrition: N.p.o. pending procedure  - Bowel Regimen  - GI ppx: PPI per home regimen    INFECTIOUS DISEASE:   #Concern for sepsis  - Febrile, hypotensive and tachycardic.  - Chest x-ray and urinalysis do not suggest pneumonia or UTI.  - Concern for possible infectious pericardial effusion.  - Vancomycin  and Zosyn  ordered empirically  - Follow-up MRSA swabs, blood cultures, procalcitonin  - Lactate normal    HEMATOLOGY/ONCOLOGY:   #Anemia of chronic disease  - VTE ppx:    ENDOCRINOLOGY/RHEUMATOLOGY:   # History of type 2 diabetes  - Hold home oral medications  - Insulin  sliding scale low-dose every 4 hours  - Glucose goal 100-180    FAMILY UPDATE:   Attempted to reach daughter by phone without success.    Advance Care Planning: Open ACP Navigator  Code Status: Full Code  Next of Kin:  Primary Emergency Contact: Gunnison,Tahsha           ADVANCE CARE PLAN                 This patient is critically ill with life-threatening condition(s) and a high probability of sudden clinically significant deterioration due to the condition(s) noted in the assessment and plan, which requires the highest level of physician/advance practice provider preparedness to intervene urgently. Full attention to the direct care of this patient was provided for the period of time noted below. Any critical care time performed today is exclusive of teaching and billable procedures and not overlapping with any other physicians or advance practice providers.    I  have personally assessed the patient and based my assessment and medical decision-making on a review of the patient's history and 24-hour interval events along with medical records, physical examination, vital signs, analysis of recent laboratory results, evaluation of radiology images, monitoring data for potential decompensation, and additional findings found in detail within ICU team notes. The findings and plan of care was discussed with the care team.    Total critical care time: 50 minutes during this encounter.    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    Bilateral cataracts     Breast cancer (CMS/HCC)     Breast lump     Cancer (CMS/HCC)     breast cancer    Carcinoma (CMS/HCC)     Carcinoma of the bladder    Chicken pox     Childhood illness    Conduction Disorder     RBBB  Diabetes mellitus type II     Non-Insulin  dependent    Echocardiogram 10/2013    Gastroesophageal reflux disease     Heart murmur     Hemorrhoids without complication     Holter monitor 02/2014    Hyperlipidemia     Hypertensive disorder     Malignant neoplasm of breast (CMS/HCC)     Measles     Childhood illness    Mumps     Childhood illness    Myocardial perfusion scan 11/2013, 12/2013    MPI single Isotope excrise; MPI dual Isotope Lexiscan     Rash     RBBB     Spinal stenosis     Stage 2 chronic kidney disease 10/29/2023    Syncope    [2]   Past Surgical History:  Procedure Laterality Date    ABLATION OF DYSRHYTHMIC FOCUS      ARTHROSCOPY, KNEE Left 04/21/2015    Procedure: ARTHROSCOPY, KNEE PARTIAL MEDIAL AND LATERAL MENISECTOMY LEFT KNEE;  Surgeon: Evie Shlomo CROME, MD;  Location: [facility];  Service: Orthopedics;  Laterality: Left;  partial medial  menisectomy    BREAST BIOPSY      COLONOSCOPY, SCREENING N/A 12/07/2022    Procedure: COLONOSCOPY, SCREENING;  Surgeon: Vernia Mabel HERO, MD;  Location: [facility];  Service: Gastroenterology;  Laterality: N/A;    EGD N/A 12/07/2022    Procedure: EGD;  Surgeon: Vernia Mabel HERO, MD;  Location: [facility];  Service: Gastroenterology;  Laterality: N/A;    HAND SURGERY      HYSTERECTOMY      total    KNEE SURGERY      TUMOR REMOVAL Left     neck  benign    [3]   Family History  Problem Relation Name Age of Onset    Diabetes Mother Earnestene Angello     Diabetes Sister Lincey Clyburn     Diabetes Sister Reena Oris     Malignant hyperthermia Neg Hx      Pseudochol deficiency Neg Hx      Anesthesia problems Neg Hx     [4]   Social History  Socioeconomic History    Marital status: Divorced   Tobacco Use    Smoking status: Never     Passive exposure: Never    Smokeless tobacco: Never   Vaping Use    Vaping status: Never Used   Substance and Sexual Activity    Alcohol use: No    Drug use: Never    Sexual activity: Not Currently     Social Drivers of Health     Financial Resource Strain: Low Risk (11/04/2023)    Overall Financial Resource Strain (CARDIA)     Difficulty of Paying Living Expenses: Not hard at all   Food Insecurity: No Food Insecurity (08/12/2024)    Hunger Vital Sign     Worried About Running Out of Food in the Last Year: Never true     Ran Out of Food in the Last Year: Never true   Transportation Needs: No Transportation Needs (08/12/2024)    PRAPARE - Therapist, Art (Medical): No     Lack of Transportation (Non-Medical): No   Physical Activity: Insufficiently Active (11/04/2023)    Exercise Vital Sign     Days of Exercise per Week: 3 days     Minutes of Exercise per Session: 30 min   Stress: No Stress Concern Present (11/04/2023)    [employer] of Occupational Health -  Occupational Stress Questionnaire     Feeling of Stress : Not at all   Social Connections: Socially Isolated  (11/04/2023)    Social Connection and Isolation Panel     Frequency of Communication with Friends and Family: Once a week     Frequency of Social Gatherings with Friends and Family: Once a week     Attends Religious Services: More than 4 times per year     Active Member of [employer] or Organizations: No     Attends Banker Meetings: Never     Marital Status: Divorced   Catering Manager Violence: Not At Risk (08/12/2024)    Humiliation, Afraid, Rape, and Kick questionnaire     Fear of Current or Ex-Partner: No     Emotionally Abused: No     Physically Abused: No     Sexually Abused: No   Housing Stability: At Risk (08/12/2024)    Housing Stability NCSS     Do you have housing?: No     Are you worried about losing your housing?: No   [5]   Allergies  Allergen Reactions    Aspirin      Break out in sweat and dizziness     Other Environmental     Peppers     Pollen Extract     Latex Itching and Rash

## 2024-08-12 NOTE — Plan of Care (Addendum)
 NURSING SHIFT NOTE     Patient: Helen [name]  Day: 1      SHIFT EVENTS     Shift Narrative/Significant Events (PRN med administration, fall, RRT, etc.):     Pt admitted to 2920 at 0810 from U28 post RRT. 500mL LR bolus given. Started on Levo due to SBP 70s w/ MAP 50s.   [id number] Pt received pericardiocentesis w/ pericardial drain. Minimal output.    Safety and fall precautions remain in place. Purposeful rounding completed.          ASSESSMENT   Drips:  Levo stopped at 1859.     Neuro: AAOx4. Drowsy.   CV: SR-ST.   Pulm: RA.   GI: Diet advanced.   BM during shift: Yes x1., Last BM: Last BM Date: 08/12/24    Mobility: PMP Activity: Step 3 - Bed Mobility of           CARE PLAN     Problem: Hemodynamic Status: Cardiac  Goal: Stable vital signs and fluid balance  Outcome: Progressing  Flowsheets (Taken 08/12/2024 1003)  Stable vital signs and fluid balance:   Assess signs and symptoms associated with cardiac rhythm changes   Monitor lab values     Problem: Inadequate Tissue Perfusion  Goal: Adequate tissue perfusion will be maintained  Outcome: Progressing  Flowsheets (Taken 08/12/2024 1003)  Adequate tissue perfusion will be maintained:   Monitor/assess lab values and report abnormal values   Monitor/assess neurovascular status (pulses, capillary refill, pain, paresthesia, paralysis, presence of edema)   Monitor/assess for signs of VTE (edema of calf/thigh redness, pain)   Monitor for signs and symptoms of a pulmonary embolism (dyspnea, tachypnea, tachycardia, confusion)   Encourage/assist patient as needed to turn, cough, and perform deep breathing every 2 hours     Problem: Moderate/High Fall Risk Score >5  Goal: Patient will remain free of falls  Outcome: Progressing  Flowsheets (Taken 08/12/2024 0810)  Moderate Risk (6-13):   MOD-Consider activation of bed alarm if appropriate   MOD-Apply bed exit alarm if patient is confused   MOD-Floor mat at bedside (where available) if appropriate   MOD-Remain with  patient during toileting   MOD-Place bedside commode and assistive devices out of sight when not in use   MOD-Perform dangle, stand, walk (DSW) prior to mobilization

## 2024-08-12 NOTE — Nursing Progress Note (Signed)
 Patient [facility] from [facility]. Alert and oriented x4, on room air. Vitally stable. Home medications reviewed. Educated on plan of care, verbalize understanding. All questions and concerns addressed at this time. Comfort and care rounding every one hour.     4 eyes in 4 hours pressure injury assessment note:    Completed with: Deward, CT  Unit & Time admitted:              Bony Prominences: Check appropriate box; if Pressure Injury is present enter Pressure Injury assessment in LDA    Occiput:                    []  Pressure Injury present  Face:                        []  Pressure Injury present  Ears:                         []  Pressure Injury present  Spine:                       []  Pressure Injury present  Shoulders:                []  Pressure Injury present  Elbows:                     []  Pressure Injury present  Sacrum/coccyx:        []  Pressure Injury present  Ischial Tuberosity:    []  Pressure Injury present  Trochanter/Hip:        []  Pressure Injury present  Knees:                      []  Pressure Injury present  Ankles:                     []  Pressure Injury present  Heels:                       []  Pressure Injury present  Other pressure areas: []  Pressure Injury location       Device related: []  Device name:         LDA completed if pressure injury present: yes/no  Consult WOCN if necessary    Other skin related issues, ie tears, rash, etc, document in Integumentary flowsheet

## 2024-08-12 NOTE — Progress Notes (Incomplete)
 [facility]  CRITICAL CARE NOTE      Patient: Helen [name]  Date: 08/11/2024   DOB: May 05, 1946  Admission Date: 08/11/2024   MRN: [id number]  Attending: Sueellen Gully, MD       Critical Care Documentation    Name: Helen [name]  Date of birth: 01/01/1946  MRN: [id number]  Admission Date: 08/11/2024 11:32 PM    Date of service: 08/12/2024    Active Diagnoses:    @HPROB @    Chief Complaint:      Clinical Presentation:      Physical Exam:       Data Reviewed:   All diagnostic labs and studies have been reviewed.      Assessment and Plan:  EF 60-65% 09/05/23  Dr. Claudene will have techs come do stat echo    Medications Administered:   Sedation: {YES/NO:21936}  Anxiolytics: {YES/NO:21936}  Antiarrhythmics: {YES/NO:21936}   Antihypertensives: {YES/NO:21936}  Pressors: {YES/NO:21936}  IVF's: Yes      Critical Care Attestation:  This patient is unstable and critically ill. Due to a high probability of clinically significant, life threatening deterioration, the patient required my highest level of preparedness to intervene emergently and I personally spent this critical care time directly and personally managing the patient. This critical care time included obtaining a history; examining the patient; pulse oximetry; ordering and review of studies; arranging urgent treatment with development of a management plan; evaluation of patient's response to treatment; frequent reassessment; and, discussions with other providers and/or family. This critical care time was performed to assess and manage the high probability of imminent, life-threatening deterioration that could result in multi-organ failure and death. It was exclusive of separately billable procedures, treating other patients, and teaching time.    Time Spent:   I personally spent 37 minutes in providing critical care time.    Gully Sueellen, MD  08/12/2024  6:24 AM

## 2024-08-13 ENCOUNTER — Inpatient Hospital Stay

## 2024-08-13 ENCOUNTER — Encounter: Payer: Self-pay | Admitting: Interventional Cardiology

## 2024-08-13 DIAGNOSIS — A419 Sepsis, unspecified organism: Secondary | ICD-10-CM

## 2024-08-13 LAB — LAB USE ONLY - CBC WITH DIFFERENTIAL
Absolute Basophils: 0.05 x10 3/uL (ref 0.00–0.08)
Absolute Eosinophils: 0.02 x10 3/uL (ref 0.00–0.44)
Absolute Immature Granulocytes: 0.07 x10 3/uL (ref 0.00–0.07)
Absolute Lymphocytes: 0.97 x10 3/uL (ref 0.42–3.22)
Absolute Monocytes: 1.58 x10 3/uL — ABNORMAL HIGH (ref 0.21–0.85)
Absolute Neutrophils: 8.29 x10 3/uL — ABNORMAL HIGH (ref 1.10–6.33)
Absolute nRBC: 0 x10 3/uL (ref <=0.00)
Basophils %: 0.5 %
Eosinophils %: 0.2 %
Hematocrit: 29.6 % — ABNORMAL LOW (ref 34.7–43.7)
Hemoglobin: 9.4 g/dL — ABNORMAL LOW (ref 11.4–14.8)
Immature Granulocytes %: 0.6 %
Lymphocytes %: 8.8 %
MCH: 22.2 pg — ABNORMAL LOW (ref 25.1–33.5)
MCHC: 31.8 g/dL (ref 31.5–35.8)
MCV: 70 fL — ABNORMAL LOW (ref 78.0–96.0)
MPV: 8.9 fL (ref 8.9–12.5)
Monocytes %: 14.4 %
Neutrophils %: 75.5 %
Platelet Count: 350 x10 3/uL — ABNORMAL HIGH (ref 142–346)
Preliminary Absolute Neutrophil Count: 8.29 x10 3/uL — ABNORMAL HIGH (ref 1.10–6.33)
RBC: 4.23 x10 6/uL (ref 3.90–5.10)
RDW: 14 % (ref 11–15)
WBC: 10.98 x10 3/uL — ABNORMAL HIGH (ref 3.10–9.50)
nRBC %: 0 /100{WBCs} (ref <=0.0)

## 2024-08-13 LAB — BASIC METABOLIC PANEL
Anion Gap: 15 (ref 5.0–15.0)
BUN: 24 mg/dL — ABNORMAL HIGH (ref 7–21)
CO2: 15 meq/L — ABNORMAL LOW (ref 17–29)
Calcium: 7.8 mg/dL — ABNORMAL LOW (ref 7.9–10.2)
Chloride: 109 meq/L (ref 99–111)
Creatinine: 1.1 mg/dL — ABNORMAL HIGH (ref 0.4–1.0)
GFR: 50.8 mL/min/1.73 m2 — ABNORMAL LOW (ref >=60.0)
Glucose: 149 mg/dL — ABNORMAL HIGH (ref 70–100)
Potassium: 3.8 meq/L (ref 3.5–5.3)
Sodium: 139 meq/L (ref 135–145)

## 2024-08-13 LAB — SEDIMENTATION RATE: Sed Rate: 82 mm/h — ABNORMAL HIGH (ref <=20)

## 2024-08-13 LAB — WHOLE BLOOD GLUCOSE POCT
Whole Blood Glucose POCT: 134 mg/dL — ABNORMAL HIGH (ref 70–100)
Whole Blood Glucose POCT: 184 mg/dL — ABNORMAL HIGH (ref 70–100)
Whole Blood Glucose POCT: 175 mg/dL — ABNORMAL HIGH (ref 70–100)
Whole Blood Glucose POCT: 196 mg/dL — ABNORMAL HIGH (ref 70–100)
Whole Blood Glucose POCT: 180 mg/dL — ABNORMAL HIGH (ref 70–100)

## 2024-08-13 LAB — RETICULOCYTES
Absolute Reticulocyte Count: 0.0491 x10 6/uL (ref 0.0220–0.1420)
Immature Retic Fract: 27.3 % — ABNORMAL HIGH (ref 1.2–15.6)
Reticulocyte Count Automated: 1.1 % (ref 0.8–2.3)
Reticulocyte Hemoglobin: 24.2 pg — ABNORMAL LOW (ref 28.4–40.2)

## 2024-08-13 LAB — MAGNESIUM: Magnesium: 1.9 mg/dL (ref 1.6–2.6)

## 2024-08-13 LAB — PROCALCITONIN: Procalcitonin: 0.9 ng/mL — ABNORMAL HIGH (ref 0.00–0.10)

## 2024-08-13 LAB — CULTURE, URINE: Culture Urine: NORMAL

## 2024-08-13 LAB — ECHO ADULT TTE LIMITED

## 2024-08-13 MED ORDER — SODIUM CHLORIDE 0.9 % IV MBP
4.5000 g | Freq: Three times a day (TID) | INTRAVENOUS | Status: DC
  Administered 2024-08-13 – 2024-08-14 (×3): 4.5 g via INTRAVENOUS
  Filled 2024-08-13 (×3): qty 22.2

## 2024-08-13 MED ORDER — COLCHICINE 0.6 MG PO TABS
0.6000 mg | ORAL_TABLET | Freq: Every day | ORAL | Status: DC
  Administered 2024-08-13 – 2024-08-15 (×3): 0.6 mg via ORAL
  Filled 2024-08-13 (×3): qty 1

## 2024-08-13 MED ORDER — METOPROLOL TARTRATE 12.5 MG PO SPLIT TAB
12.5000 mg | ORAL_TABLET | Freq: Two times a day (BID) | ORAL | Status: DC
  Administered 2024-08-13 – 2024-08-15 (×4): 12.5 mg via ORAL
  Filled 2024-08-13 (×5): qty 1

## 2024-08-13 MED ORDER — PANTOPRAZOLE SODIUM 40 MG PO TBEC
40.0000 mg | DELAYED_RELEASE_TABLET | Freq: Every morning | ORAL | Status: DC
  Administered 2024-08-14 – 2024-08-15 (×2): 40 mg via ORAL
  Filled 2024-08-13 (×2): qty 1

## 2024-08-13 NOTE — Progress Notes (Addendum)
 Vascular Access consulted for midline placement. Pt has PICC line inserted yesterday for vasopressors, however, pt is no longer on vasopressors. Discussed with bedside RN Schuyler, dt the difficulty of the PICC insertion, and pt having a R limb restriction dt hx mastectomy, recommend holding of PICC removal for at least 24 hours after discontinuation of vasopressors. Intensivist made aware per bedside RN, ok to hold off PICC removal until tomorrow.

## 2024-08-13 NOTE — PT Eval Note (Signed)
 .Physical Therapy Eval and Treatment  Helen [name]      Post Acute Care Therapy Recommendations   Discharge Recommendations:  Home with supervision, Home with home health PT, Home with home health OT      DME needs IF patient is discharging home: Front wheel walker (junior RW)    Therapy discharge recommendations may change with patient status.  Please refer to most recent note for up-to-date recommendations.    Unit: MED SURG ICU 1  Bed: A2920/A2920-01    ___________________________________________________    Time of Evaluation and Treatment:  Time Calculation   PT Received On: 08/13/24  Start Time: 1435  Stop Time: 1452  Time Calculation (min): 17 min       Evaluation Time: 8 minutes  Treatment Time: 9 minutes    PT Visit Number: 1    Consult received for Helen [name] for PT Evaluation and Treatment.  Patients medical condition is appropriate for Physical therapy intervention at this time.    Activity Orders:  PT eval and treat    Precautions and Contraindications:  Precautions  Weight Bearing Status: no restrictions  Aspiration Precautions: see SLP recommendations  Fall Risks: High, Impaired balance/gait, Impaired mobility, Muscle weakness    Personal Protective Equipment (PPE)  gloves and procedure mask    Medical Diagnosis:  Pericardial effusion [I31.39]    History of Present Illness:  Helen [name] is a 78 y.o. female admitted on 08/11/2024 with progressive palpitations, nausea and decreased appetite.  In ED she was noted to be febrile with temperature 101 F, and sinus tachycardia with heart rate 110s to 120s, normal respiratory rate and oxygenating adequately on room air, GCS 15.  Chest x-ray revealed marked cardiomegaly concerning for pericardial effusion.  BMP and LFTs were benign.     .father    Problem List[1]    Past Medical/Surgical History:  Medical History[2]  Past Surgical History[3]    X-Rays/Tests/Labs:  Lab Results   Component Value Date/Time    HGB 9.4 (L) 08/13/2024 03:41 AM     HGB 12.6 02/10/2024 12:00 AM    HGB 11.2 (L) 07/30/2012 09:52 AM    HCT 29.6 (L) 08/13/2024 03:41 AM    HCT 42.0 02/10/2024 12:00 AM    K 3.8 08/13/2024 03:41 AM    K 4.3 02/10/2024 12:00 AM    NA 139 08/13/2024 03:41 AM    NA 139 02/10/2024 12:00 AM    INR 1.3 (H) 08/12/2024 02:39 AM    TROPI 5.8 08/12/2024 02:39 AM    TROPI 3.9 08/11/2024 07:45 PM    TROPI <0.01 10/12/2013 06:23 AM    TROPI <0.01 10/11/2013 01:17 AM    TROPI <0.01 10/10/2013 05:39 PM    TROPI <0.01 10/10/2013 11:01 AM       All imaging reviewed, please see chart for details.    Social History:  Prior Level of Function  Prior level of function: Independent with ADLs, Ambulates with assistive device  Assistive Device: Single point cane (occ.)  Baseline Activity Level: Community ambulation  Ambulated 100 feet or more prior to admission: Yes  Driving: does not drive  Cooking: Yes  Employment: Retired  DME Currently at Home: ADL- Paediatric Nurse, ADL- Grab Bars, Medical Laboratory Scientific Officer, Single [employer] Living Arrangements  Living Arrangements: Children, Family members  Type of Home: House  Home Layout: Two level, Bed/bath upstairs (no STE, flight of steps to bedroom)  Bathroom Shower/Tub: Medical Sales Representative: Standard  Bathroom  Equipment: Paediatric nurse, Grab bars in shower  DME Currently at Home: ADL- Paediatric Nurse, ADL- Grab Bars, [place], Single Point  Home Living - Notes / Comments: pt reports indep with all mobility and ADLS inside home,  dtr there to assist with cleaning, cooking; pt reports to only using SPC at times for mobility in and outside home      Subjective:  Patient is agreeable to participation in the therapy session. Nursing clears patient for therapy.     Pain Assessment  Pain Assessment: No/denies pain      Objective:  Observation of Patient/Vital Signs:  [name]  Vitals:    08/13/24 1500   BP: 132/69   Pulse: (!) 146   Resp: (!) 24   Temp:    SpO2: 94%    HR elevated during session, ltd mobility          Cognitive Status and Neuro  Exam:  Cognition/Neuro Status  Arousal/Alertness: Appropriate responses to stimuli  Attention Span: Appears intact  Orientation Level: Oriented X4  Memory: Appears intact  Following Commands: Follows all commands and directions without difficulty  Safety Awareness: minimal verbal instruction  Insights: Educated in engineer, building services  Problem Solving: minimal assistance  Behavior: cooperative;calm  Motor Planning: intact  Coordination: intact    Musculoskeletal Examination  Gross ROM  Right Lower Extremity ROM: within functional limits  Left Lower Extremity ROM: within functional limits  Gross Strength  Right Lower Extremity Strength: 3/5  Left Lower Extremity Strength: 3/5       Functional Mobility:  Functional Mobility  Supine to Sit: Minimal Assist;Increased Time;Increased Effort  Scooting to EOB: Minimal Assist;Increased Time;Increased Effort  Sit to Supine: Minimal Assist  Sit to Stand: Minimal Assist;Increased Time;Increased Effort  Stand to Sit: Minimal Assist  Transfers  Bed to Chair: Unable to assess (Comment)  Locomotion  Ambulation: Minimal Assist (w/ HHA, sidesteps)  Pattern: decreased cadence;decreased step length;Step to     Balance  Balance  Sitting - Static: Good  Standing - Static: Fair    Participation and Activity Tolerance  Participation and Endurance  Participation Effort: fair  Endurance: Tolerates < 10 min exercise with changes in vital signs  [place] Dyspnea Scale: 1+ Dyspnea         Educated the Patient to role of physical therapy, plan of care, goals of therapy and HEP, safety with mobility and ADLs, discharge instructions, home safety with verbalized understanding .    Patient left in bed. Pt left with all appropriate medical equipment in place, call bell and pt personal items/needs within reach, and without alarm in place ( please note pt received without alarm in place or left without alarm per unit protocol). RN notified of session outcome.        Assessment:  Helen [name]  is a 78 y.o. female admitted 08/11/2024.  PT Assessment  Assessment: Decreased LE strength;Decreased safety/judgement during functional mobility;Decreased endurance/activity tolerance;Decreased functional mobility;Decreased balance;Gait impairment  Prognosis: Good;With continued PT status post acute discharge  Progress: Progressing toward goals            Treatment:  Pt received supine in bed, agreed to therapy. Vitals monitored t/o session, RN cleared PT prior to session. Pt denied pain initially, reports some pain in back and buttock during session. Pt able to answer questions about PLOF and home set up. Pt completed bed mobility, req extra time and ast. Pt reports dizziness upon sitting EOB. Pt stood at EOB with HHA. Pt able to  perform sidesteps, HR inc to 160. Pt sitting back down, HR remained elevated and pt reclined supine, req ast for BLE's. Pt able to scoot towards HOB. Discussed supine there ex to perform, and continued mobility with RN as medically able, RN agreed. All needs in reach at end of session.       Plan:  Treatment/Interventions: Exercise, Stair training, Gait training, Neuromuscular re-education, Functional transfer training, LE strengthening/ROM, Endurance training, Patient/family training, Equipment eval/education, Bed mobility  PT Frequency: follow-up visit only  Risks/Benefits/POC Discussed with Pt/Family: With patient    PMP Activity: Step 5 - Chair  Distance Walked (ft) (Step 6,7): 2 Feet      Goals:  Goals  Goal Formulation: With patient  Time for Goal Acheivement: By time of discharge  Pt Will Go Supine To Sit: with supervision, to maximize functional mobility and independence  Pt Will Perform Sit to Stand: with supervision, to maximize functional mobility and independence  Pt Will Transfer Bed/Chair: with rolling walker, with supervision, to maximize functional mobility and independence  Pt Will Ambulate: 51-100 feet, with rolling walker, with supervision, to maximize functional mobility  and independence  Pt Will Go Up / Down Stairs: 1 flight, with supervision, With rail, to maximize functional mobility and independence    Charlene Mayers, PT,DPT  [id number]  08/13/2024 3:22 PM  Hours:W and F 8-430 PM; Th 9-530 PM      [facility]  Patient: Jalaiya Oyster MRN#: [id number]  Unit: MED SURG ICU 1 Bed: A2920/A2920-01        [1]   Patient Active Problem List  Diagnosis    Hypertension    Hyperlipidemia    H/O total hysterectomy with removal of both tubes and ovaries    Heart block AV first degree    RBBB (right bundle branch block)    Palpitations    Pure hypercholesterolemia, unspecified    Nonspecific abnormal electrocardiogram (ECG) (EKG)    Type 2 diabetes mellitus without complication, without long-term current use of insulin  (CMS/HCC)    Anemia    History of colonic polyps    Gastroesophageal reflux disease, unspecified whether esophagitis present    Osteopenia after menopause    Stage 2 chronic kidney disease    Pericardial effusion   [2]   Past Medical History:  Diagnosis Date    Anemia     iron    currently on medication      Arthritis     general body      Arthritis     Generalized    Asthma     Asthma without status asthmaticus     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    Bilateral cataracts     Breast cancer (CMS/HCC)     Breast lump     Cancer (CMS/HCC)     breast cancer    Carcinoma (CMS/HCC)     Carcinoma of the bladder    Chicken pox     Childhood illness    Conduction Disorder     RBBB    Diabetes mellitus type II     Non-Insulin  dependent    Echocardiogram 10/2013    Gastroesophageal reflux disease     Heart murmur     Hemorrhoids without complication     Holter monitor 02/2014    Hyperlipidemia     Hypertensive disorder     Malignant neoplasm of breast (CMS/HCC)     Measles     Childhood  illness    Mumps     Childhood illness    Myocardial perfusion scan 11/2013, 12/2013    MPI single Isotope excrise; MPI dual Isotope Lexiscan     Rash     RBBB     Spinal  stenosis     Stage 2 chronic kidney disease 10/29/2023    Syncope    [3]   Past Surgical History:  Procedure Laterality Date    ABLATION OF DYSRHYTHMIC FOCUS      ARTHROSCOPY, KNEE Left 04/21/2015    Procedure: ARTHROSCOPY, KNEE PARTIAL MEDIAL AND LATERAL MENISECTOMY LEFT KNEE;  Surgeon: Evie Shlomo CROME, MD;  Location: [facility];  Service: Orthopedics;  Laterality: Left;  partial medial menisectomy    BREAST BIOPSY      COLONOSCOPY, SCREENING N/A 12/07/2022    Procedure: COLONOSCOPY, SCREENING;  Surgeon: Vernia Mabel HERO, MD;  Location: [facility];  Service: Gastroenterology;  Laterality: N/A;    EGD N/A 12/07/2022    Procedure: EGD;  Surgeon: Vernia Mabel HERO, MD;  Location: [facility];  Service: Gastroenterology;  Laterality: N/A;    HAND SURGERY      HYSTERECTOMY      total    KNEE SURGERY      PERICARDIOCENTESIS N/A 08/12/2024    Procedure: Pericardiocentesis;  Surgeon: Jafir, Ahsan Q, DO;  Location: [facility];  Service: Cardiovascular;  Laterality: N/A;    TUMOR REMOVAL Left     neck  benign

## 2024-08-13 NOTE — Treatment Plan (Signed)
 4 eyes in 4 hours pressure injury assessment note:      Completed with:   Unit & Time admitted:              Bony Prominences: Check appropriate box; if Pressure Injury is present enter Pressure Injury assessment in LDA    Occiput:                    []  Pressure Injury present  Face:                        []  Pressure Injury present  Ears:                         []  Pressure Injury present  Spine:                       []  Pressure Injury present  Shoulders:                []  Pressure Injury present  Elbows:                     []  Pressure Injury present  Sacrum/coccyx:        []  Pressure Injury present  Ischial Tuberosity:    []  Pressure Injury present  Trochanter/Hip:        []  Pressure Injury present  Knees:                      []  Pressure Injury present  Ankles:                     []  Pressure Injury present  Heels:                       []  Pressure Injury present  Other pressure areas: []  Pressure Injury location       Device related: []  Device name:         LDA completed if pressure injury present: N/A  Consult WOCN if necessary    Other skin related issues, ie tears, rash, etc, document in Integumentary flowsheet

## 2024-08-13 NOTE — Progress Notes (Signed)
 [place]  HEART CARDIOLOGY PROGRESS NOTE    [facility]  MED SURG ICU 1  [address]  [place] [address]  Dept: [phone]  Loc: [phone]    Date Time: 08/13/2024 11:16 AM  Patient Name: [facility] Day: 2    Subjective/Cardiac Relevant Events:   Drain output is < 20cc in 24 hours.  She feels remarkably better and is now off pressors.   She is being treated with antibiotics.  Pericardial fluid cytology is pending.  Patient remained on pressors for some time following drain placement, which further argues against tamponade as I would expect marked improvement in hemodynamics following evacuation of the fluid.    ASSESSMENT:   Patient is a 78 y.o. female with the following relevant diagnoses:    Hemorrhagic pericardial effusion s/p pericardiocentesis. CT chest abdomen and pelvis unrevealing for possible active malignancy.  History of breast CA 28 years ago.  Type 2 diabetes      RECOMMENDATIONS:     Limited echo today. If effusion has resolved, the drain can be pulled and would plan to repeat another limited echo within a week.  Differential for hemorrhagic pericardial effusion includes TB, malignancy. Routine pericarditis tends to be more serous/serosanguinous than bloody.  Continue ID and Oncology consults.  Continue colchicine  0.6mg  daily.  Would avoid blood thinners as able.    Telemetry/Labs Reviewed/Intake-Output:     Tele: Sinus with PACs/runs of SVT.      Recent Labs   Lab 08/12/24  0239 08/11/24  1945   hs Troponin 5.8 3.9             Recent Labs   Lab 08/11/24  1934   Bilirubin, Total 0.9   Protein, Total 8.0   Albumin 3.3*   ALT 15   AST (SGOT) 31     Recent Labs   Lab 08/13/24  0341   Magnesium  1.9     Recent Labs   Lab 08/12/24  0239   PT 14.1*   INR 1.3*     Recent Labs   Lab 08/13/24  0341 08/12/24  0239 08/11/24  1933   WBC 10.98* 10.43* 11.18*   Hemoglobin 9.4* 10.1* 10.7*   Hematocrit 29.6* 31.4* 34.6*   Platelet Count 350* 352* 382*     Recent Labs   Lab  08/13/24  0341 08/12/24  0239 08/11/24  1934   Sodium 139 139 134*   Potassium 3.8 4.0 3.8   Chloride 109 102 96*   CO2 15* 21 23   BUN 24* 12 14   Creatinine 1.1* 0.8 0.9   GFR 50.8* >60.0 >60.0   Glucose 149* 92 109*   Calcium  7.8* 8.7 8.7           Invalid input(s): FREET4   No results found for: NTPROBNP      Intake/Output Summary (Last 24 hours) at 08/13/2024 1116  Last data filed at 08/13/2024 0930  Gross per 24 hour   Intake 2513.56 ml   Output 1170 ml   Net 1343.56 ml       Medications:      Scheduled Meds: PRN Meds:    colchicine , 0.6 mg, Oral, Daily  insulin  lispro, 1-5 Units, Subcutaneous, Q4H SCH  [START ON 08/14/2024] pantoprazole , 40 mg, Oral, QAM AC  piperacillin -tazobactam, 4.5 g, Intravenous, Q8H  senna-docusate, 1 tablet, Oral, Q12H SCH        Continuous Infusions:   acetaminophen , 500 mg, Q4H PRN  benzocaine -menthol , 1 lozenge, Q2H PRN  benzonatate , 100 mg, TID PRN  carboxymethylcellulose sodium, 1 drop, TID PRN  dextrose , 15 g of glucose, PRN   Or  dextrose , 12.5 g, PRN   Or  dextrose , 12.5 g, PRN   Or  glucagon  (rDNA), 1 mg, PRN  magnesium  sulfate, 1 g, PRN  melatonin, 3 mg, QHS PRN  naloxone , 0.2 mg, PRN  ondansetron , 4 mg, Q6H PRN   Or  ondansetron , 4 mg, Q6H PRN  polyethylene glycol, 17 g, Daily PRN  potassium & sodium phosphates , 2 packet, PRN  potassium chloride , 0-60 mEq, PRN   Or  potassium chloride , 0-60 mEq, PRN   Or  potassium chloride , 10 mEq, PRN  saline, 2 spray, Q4H PRN          Physical Exam:     Vitals:    08/13/24 0600 08/13/24 0700 08/13/24 0754 08/13/24 0800   BP: 125/60 134/60  125/58   Pulse: 85 80  87   Resp: 15 18  21    Temp:   97.7 F (36.5 C)    TempSrc:   Oral    SpO2: 96% 96%  95%   Weight:       Height:             05/12/2024 05/21/2024 07/20/2024 07/25/2024 08/11/2024 08/12/2024 08/13/2024   Weight Monitoring   Height 155.5 cm 154.9 cm 154.9 cm 154.9 cm 154.9 cm 154.9 cm    154.9 cm    Height Method    Stated  Stated    Weight 70.852 kg 71.668 kg 69.854 kg 70.761 kg  68.9 kg    69.854 kg 65.7 kg    68.9 kg 72.2 kg   Weight Method     Bed Scale Bed Scale Bed Scale   BMI (calculated) 29.3 kg/m2 29.9 kg/m2 29.1 kg/m2 29.5 kg/m2 28.7 kg/m2    29.1 kg/m2 27.4 kg/m2    28.7 kg/m2 30.1 kg/m2       Multiple values from one day are sorted in reverse-chronological order       Constitutional: Cooperative, alert, no acute distress. Sitting up in chair.  Neck: JVP normal.  Cardiac: Regular rate and rhythm, normal S1 and S2; no S3 or S4, no murmurs, no rubs, no gallops.  Pulmonary: Clear to auscultation bilaterally, no wheezing, no rhonchi, no rales.  Extremities: no edema.    -------------------------------------------------------------------------------------  Signed by:         Dorn LILLETTE Calender, MD         [place]  [facility]    [place]  [facility] Contact Information   [facility]  Secure Chat (Group):   [facility] [place] [facility]    APP Spectralink:  [phone]    MD Spectralink :  [phone]  [phone]    After hours, non urgent consult line:  [phone]    After hours, physician on-call:  [phone] [facility]  Secure Chat (Group):   [facility] [place] [facility]    APP Spectralink:  [phone]    MD Spectralink :  [phone]      After hours, non urgent consult line:  [phone]    After hours, physician on-call:  [phone] [facility]  Secure Chat (Group):   [facility] [place] [facility]    APP Spectralink:  [phone]    MD Spectralink :  [phone]      After hours, non urgent consult line:  [phone]    After hours, physician on-call:  [phone] [facility]  Secure Chat (Group):   [place]  [facility]    APP Spectralink:  [phone]    MD Spectralink :  [phone]      After hours, non urgent consult line:  [phone]    After hours, physician on-call:  [phone]       Please pardon any potential grammatical or typographical errors as aspects of this note may have been created through speech-to-text software.

## 2024-08-13 NOTE — Plan of Care (Signed)
 Comments:  - Patient AOx4, MAE, follows commands. No c/o pain. Up to bathroom and to chair w/x1 assist  - HR 80-100s, NSR w/BBB, occasional runs of bigeminy. HR can intermittently go up to 160s (wide complex tachycardia) with OOB/ambulation, resolves spontaneously. sBP 110-160s. Pulses palpable, nonpitting LE edema. Pericardial drain removed  - On room air, breath sounds clear/diminished. Slight w/dyspnea w/exertion  - Soft rounded abdomen, active BS, BM x1. Tolerating PO intake  - Continent, urine clear/yellow  - Hyperpigmentation to sacrum/coccyx, pericardial drain site clean/dry/intact  - Maintain PICC line until tomorrow    Plan: OOB/mobility, remove PICC line, monitor HR/BP, downgrade?      Problem: Altered GI Function  Goal: Fluid and electrolyte balance are achieved/maintained  Outcome: Progressing  Flowsheets (Taken 08/13/2024 2005)  Fluid and electrolyte balance are achieved/maintained:   Monitor/assess lab values and report abnormal values   Assess and reassess fluid and electrolyte status   Observe for cardiac arrhythmias   Monitor for muscle weakness     Problem: Hemodynamic Status: Cardiac  Goal: Stable vital signs and fluid balance  Outcome: Progressing  Flowsheets (Taken 08/13/2024 2005)  Stable vital signs and fluid balance:   Assess signs and symptoms associated with cardiac rhythm changes   Monitor lab values     Problem: Inadequate Tissue Perfusion  Goal: Adequate tissue perfusion will be maintained  Outcome: Progressing  Flowsheets (Taken 08/13/2024 2005)  Adequate tissue perfusion will be maintained:   Encourage/assist patient as needed to turn, cough, and perform deep breathing every 2 hours   Monitor/assess for signs of VTE (edema of calf/thigh redness, pain)   Monitor/assess lab values and report abnormal values   Monitor/assess neurovascular status (pulses, capillary refill, pain, paresthesia, paralysis, presence of edema)     Problem: Ineffective Gas Exchange  Goal: Effective breathing  pattern  Outcome: Progressing     Problem: Fluid and Electrolyte Imbalance/ Endocrine  Goal: Fluid and electrolyte balance are achieved/maintained  Outcome: Progressing  Flowsheets (Taken 08/13/2024 2005)  Fluid and electrolyte balance are achieved/maintained:   Monitor/assess lab values and report abnormal values   Assess and reassess fluid and electrolyte status   Observe for cardiac arrhythmias   Monitor for muscle weakness     Problem: Diabetes: Glucose Imbalance  Goal: Blood glucose stable at established goal  Outcome: Progressing  Flowsheets (Taken 08/13/2024 2005)  Blood glucose stable at established goal:   Monitor lab values   Monitor intake and output.  Notify LIP if urine output is < 30 mL/hour.   Assess for hypoglycemia /hyperglycemia   Monitor/assess vital signs

## 2024-08-13 NOTE — Plan of Care (Incomplete)
 - No complaint made the whole shift; hemodynamically stable    Problem: Hemodynamic Status: Cardiac  Goal: Stable vital signs and fluid balance  Outcome: Progressing  Flowsheets (Taken 08/13/2024 2249)  Stable vital signs and fluid balance:   Assess signs and symptoms associated with cardiac rhythm changes   Monitor lab values     Problem: Altered GI Function  Goal: Fluid and electrolyte balance are achieved/maintained  Outcome: Progressing  Flowsheets (Taken 08/13/2024 2249)  Fluid and electrolyte balance are achieved/maintained:   Monitor/assess lab values and report abnormal values   Assess and reassess fluid and electrolyte status   Observe for cardiac arrhythmias   Monitor for muscle weakness     Problem: Fluid and Electrolyte Imbalance/ Endocrine  Goal: Fluid and electrolyte balance are achieved/maintained  Outcome: Progressing  Flowsheets (Taken 08/13/2024 2249)  Fluid and electrolyte balance are achieved/maintained:   Monitor/assess lab values and report abnormal values   Assess and reassess fluid and electrolyte status   Observe for cardiac arrhythmias   Monitor for muscle weakness     Problem: Diabetes: Glucose Imbalance  Goal: Blood glucose stable at established goal  Outcome: Progressing  Flowsheets (Taken 08/13/2024 2249)  Blood glucose stable at established goal:   Monitor lab values   Monitor intake and output.  Notify L[facility] if urine output is < 30 mL/hour.   Follow fluid restrictions/IV/PO parameters   Assess for hypoglycemia /hyperglycemia   Include patient/family in decisions related to nutrition/dietary selections   Monitor/assess vital signs   Ensure adequate hydration   Ensure appropriate diet and assess tolerance   Coordinate medication administration with meals, as indicated   Ensure patient/family has adequate teaching materials   Ensure appropriate consults are obtained (Nutrition, Diabetes Education, and Case Management)     Problem: Safety  Goal: Patient will be free from injury during  hospitalization  Outcome: Progressing  Flowsheets (Taken 08/13/2024 2249)  Patient will be free from injury during hospitalization:   Assess patient's risk for falls and implement fall prevention plan of care per policy   Provide and maintain safe environment   Use appropriate transfer methods   Hourly rounding   Include patient/ family/ care giver in decisions related to safety   Ensure appropriate safety devices are available at the bedside   Assess for patients risk for elopement and implement Elopement Risk Plan per policy   Provide alternative method of communication if needed (communication boards, writing)  Goal: Patient will be free from infection during hospitalization  Outcome: Progressing  Flowsheets (Taken 08/13/2024 2249)  Free from Infection during hospitalization:   Assess and monitor for signs and symptoms of infection   Monitor lab/diagnostic results   Monitor all insertion sites (i.e. indwelling lines, tubes, urinary catheters, and drains)     Problem: Pain  Goal: Pain at adequate level as identified by patient  Outcome: Progressing  Flowsheets (Taken 08/13/2024 2249)  Pain at adequate level as identified by patient:   Identify patient comfort function goal   Assess for risk of opioid induced respiratory depression, including snoring/sleep apnea. Alert healthcare team of risk factors identified.   Assess pain on admission, during daily assessment and/or before any as needed intervention(s)   Reassess pain within 30-60 minutes of any procedure/intervention, per Pain Assessment, Intervention, Reassessment (AIR) Cycle   Offer non-pharmacological pain management interventions   Evaluate patient's satisfaction with pain management progress   Evaluate if patient comfort function goal is met   Consult/collaborate with Pain Service   Consult/collaborate with  Physical Therapy, Occupational Therapy, and/or Speech Therapy   Include patient/patient care companion in decisions related to pain management as  needed

## 2024-08-13 NOTE — Progress Notes (Signed)
 [facility]  Progress Note      Patient Name: Helen [name]  MRN: [id number]  Room: A2920/A2920-01    Reason for Admission: Pericardial effusion  ICU Day: 23h    Assessment & Plan   [facility] Attending Assessment/Plan:  See below  Billing:           Overview   [name] is a 78 y.o. female with PMHx of DM 2, HTN, HLD, breast cancer, bladder carcinoma, RBBB, CKD stage II, spinal stenosis, multiple viral illnesses in childhood (measles, mumps, chickenpox), who presented to ED on 08/11/2024 with progressive palpitations, nausea and decreased appetite.  In ED she was noted to be febrile with temperature 101 F, and sinus tachycardia with heart rate 110s to 120s, normal respiratory rate and oxygenating adequately on room air, GCS 15.  Chest x-ray revealed marked cardiomegaly concerning for pericardial effusion.  BMP and LFTs were benign.  Anion gap was 16 and lactate was normal at 1.4.  CBC notable for mild leukocytosis and anemia with WBC 10.4 K, hemoglobin 10.1, platelets mildly elevated.  COVID and flu swabs are negative.  Urinalysis showed glucosuria and mild ketonuria with no evidence of infection.  CTA of the chest abdomen pelvis revealed a large pericardial effusion with findings suggestive of pericarditis, trace left pleural fluid, postsurgical changes of the right breast, incidental finding of cholelithiasis and colonic diverticulosis, no evidence of aortic dissection.  Cardiology was consulted and a stat echocardiogram was ordered.  [facility] was consulted and the patient was accepted for admission to the ICU.  Patient seen and evaluated on unit 28 with echocardiogram underway, confirming the presence of a moderate pericardial effusion, formal read pending.  Patient noted to be tachycardic with heart rate 120s to 130s, hypotensive with SBP in the 70s, improved to 80s after 1 L NS bolus.  1 L of LR bolus was ordered and the patient was started on broad-spectrum antimicrobial coverage by  [facility].  She denies chest pain, dyspnea, dizziness, or lightheadedness.  She endorses nausea and had an episode of vomiting earlier for which she received Compazine  x 2.   (Copied from H&P)    Subjective   Feels much better.  Denies pain or dyspnea.  Pericardial drain output minimal.  Tolerating diet, on room air, OOB to chair.  Intermittent tachycardia with palpitations on exertion    Hospital Course:  12/10: Admitted with ICU, started on Levophed , underwent urgent pericardiocentesis with pericardial drain placement and and removal of 470 cc of bloody pericardial fluid.  Transferred to ICU, continued on Levophed , PICC line placed.  Empiric antibiotics started.  ID and heme-onc consulted.  12/11: Weaned off Levophed .  Intermittent SVT, started low-dose PO metoprolol  BID with improvement.  Limited echo with no recurrence of effusion.  Pericardial drain removed in the later afternoon.       Objective   Physical Examination     Last vent settings if on vent:       Vitals Temp:  [97.5 F (36.4 C)-98.5 F (36.9 C)]   Heart Rate:  [79-125]   Resp Rate:  [15-32]   BP: (65-164)/(48-66)   SpO2:  [94 %-98 %]   Height:  [154.9 cm (5' 1)]   Weight:  [65.7 kg (144 lb 13.5 oz)-72.2 kg (159 lb 2.8 oz)]   BMI (calculated):  [27.4-30.1]  // Temperature with 24 range    Input / Output:   Intake/Output Summary (Last 24 hours) at 08/13/2024 0730  Last data filed at 08/13/2024  0700  Gross per 24 hour   Intake 4422.56 ml   Output 1540 ml   Net 2882.56 ml       General: Elderly woman in no acute distress, pleasant    Neuro:    Awake and alert, grossly nonfocal exam    Lungs:   clear to auscultation, no wheezes, rales or rhonchi, symmetric air entry, no tachypnea, retractions or cyanosis     Cardiac:   normal rate, regular rhythm, normal S1, S2, no murmurs, rubs, clicks or gallops     Abdomen:    soft, nontender, nondistended, no masses or organomegaly  bowel sounds normal     Extremities:   peripheral pulses normal no pedal edema no  clubbing or cyanosis no ulcers, gangrene or atrophic changes    Skin:     Warm, dry and intact      [facility] Checklist  Sedation:   CAM-ICU: Positive or Negative for Delirium: Negative   CAM ICU:   Last Documented RASS: RASS Score: Alert and Calm   Currently ordered infusions:    Reviewed: Yes   Mobility:   Current Mobility Level: PMP Activity: Step 3 - Bed Mobility (08/13/2024  4:00 AM)    Current PT Order: No  Current OT Order: No Reviewed: Yes   Respiratory (n/a if blank):   Ventilator Time:    Last Recorded Vent Mode:    Reviewed: Yes   Gastrointenstinal  Last Bowel Movement:   Last BM Date: 08/12/24 Reviewed: Yes   CAUTI Prevention (n/a if blank):  Foley Day:        Reviewed: Yes   Blood Stream Infection Prevention (n/a if blank):   PICC Double Lumen 08/12/24 Left Basilic Reviewed: Yes   DVT Chemoprophylaxis (none if blank):    Reviewed: Yes       Assessment   Helen [name] is a 77 y.o. female who presents with pericardial effusion with concern for tamponade.      Active Problems:   # see below      Patient has a BMI of 30.08 kg/m2    Class 1 Obesity: BMI of 30 to 34.9       Recent Labs     08/13/24  0341 08/12/24  0239 08/11/24  1934   Sodium 139 139 134*     Diagnosis: Mild Hyponatremia     Recent Labs   Lab 08/13/24  0341 08/12/24  0239 08/11/24  1933   Hemoglobin 9.4* 10.1* 10.7*   Hematocrit 29.6* 31.4* 34.6*   MCV 70.0* 70.2* 73.2*   WBC 10.98* 10.43* 11.18*   Platelet Count 350* 352* 382*     Recent Labs   Lab 05/12/24  0000   Ferritin 358*   Iron 60   TIBC 288   Iron Saturation 21     Anemia Diagnosis: Chronic: Anemia of Chronic Disease  Cr Baseline Estimation (minimum in last 3 months): 0.8 mg/dL  Maximum Cr in last 36 hours: 1.1 mg/dL    Recent Labs (Last 3 Months)     08/13/24  0341 08/12/24  0239 08/11/24  1934   Creatinine 1.1* 0.8 0.9   BUN 24* 12 14       Acute Kidney Injury Diagnosis: Acute kidney injury, present on admission     Laboratory Evidence of Acidosis:  Recent Labs   Lab  08/13/24  0341 08/12/24  0239 08/11/24  1934   CO2 15* 21 23        Acidosis Diagnosis: Metabolic  Acidosis                              Plan   NEUROLOGICAL:  - Oriented x 3, drowsy but arousable, no acute issues  - Tylenol  as needed for pain  Delirium precautions including maintenance of day/night wake cycles, frequent reorientation as needed           Physical therapy/Occupational Therapy/Out of bed to chair, as indicated/tolerated     CARDIOVASCULAR:   #Pericardial effusion status post pericardiocentesis and drain placement  #History of HTN and HLD  #Chronic RBBB  #Undifferentiated shock, resolved  #SVT  -Weaned off Levophed , hemodynamically stable  - No evidence of tamponade on initial echo.  No recurrence of effusion on limited echo today.  - Pericardial drain output minimal, removed by cardiologist this afternoon  - Tachyarrhythmia likely provoked by pericardial drain, improved status post removal and initiation of low-dose oral metoprolol   - Continue broad-spectrum antibiotics as detailed below  - Differential includes malignant versus infectious pericardial effusion  - ECG reveals sinus tachycardia with known RBBB, no ischemic changes.  Troponins negative.  No evidence to suggest ACS.  - Hold home antihypertensives and statin for now  - Cardiology input appreciated  -Repeat limited echo in a.m.  -Midline ordered though patient was apparently very difficult to achieve IV access on.  Will maintain PICC line for now until adequate PIV access can be secured.  - Goal MAP >65     PULMONARY:   - No acute issues  - Oxygenating well on room air  - Chest x-ray with cardiomegaly and no pulmonary infiltrates     RENAL:   #AKI on CKD stage II  # Non-anion gap metabolic acidosis  - Electrolytes benign, creatinine mildly elevated today  - Replace electrolytes as needed per protocol, Daily BMP  - Monitor urine output  - Courage p.o. fluids  - No indication for HD     GASTROINTESTINAL:  #History of GERD  - At risk for  constipation  - Nutrition: Regular diet  - Bowel Regimen, passing gas, no BM as of yet  - GI ppx: PPI per home regimen     INFECTIOUS DISEASE:   #Concern for sepsis  - Febrile, hypotensive and tachycardic.  - Chest x-ray and urinalysis do not suggest pneumonia or UTI.  - Concern for possible infectious pericardial effusion.  - Vancomycin  and Zosyn  ordered empirically  - Follow-up MRSA swabs, blood cultures, procalcitonin  - Lactate normal  - ID input appreciated.  Plan to de-escalate antibiotics pending culture data and clinical course     HEMATOLOGY/ONCOLOGY:   #Anemia of chronic disease  #History of breast cancer  #Rule out malignant pericardial effusion  #Anemia of chronic disease  #Possible history of bladder cancer per EMR (patient denies)  -Appreciate heme-onc input  - Follow-up pericardial cytology  - Tumor markers ordered by oncology team  -CT imaging with no evidence of cancer recurrence  - VTE ppx: SCDs only given bloody pericardial     ENDOCRINOLOGY/RHEUMATOLOGY:   # History of type 2 diabetes  - Hold home oral medications  - Insulin  sliding scale low-dose every 4 hours  - Glucose goal 100-180    FAMILY UPDATE:   Will  update family later on.    Advance Care Planning: Open ACP Navigator  Code Status: Full Code  Next of Kin:  Primary Emergency Contact: Minnette Agreste  ADVANCE CARE PLAN                 This patient is critically ill with life-threatening condition(s) and a high probability of sudden clinically significant deterioration due to the condition(s) noted in the assessment and plan, which requires the highest level of physician/advance practice provider preparedness to intervene urgently. Full attention to the direct care of this patient was provided for the period of time noted below. Any critical care time performed today is exclusive of teaching and billable procedures and not overlapping with any other physicians or advance practice providers.    I have personally assessed the  patient and based my assessment and medical decision-making on a review of the patient's history and 24-hour interval events along with medical records, physical examination, vital signs, analysis of recent laboratory results, evaluation of radiology images, monitoring data for potential decompensation, and additional findings found in detail within ICU team notes. The findings and plan of care was discussed with the care team.    Total critical care time: 45 minutes during this encounter.    Lonni JAYSON Gobble, MD   08/13/2024 7:30 AM

## 2024-08-13 NOTE — Plan of Care (Signed)
 Plan of care:    Echo reviewed. Pericardial fluid has resolved.  I removed the pericardial drain. Gauze and tegaderm applied.    Recommend another limited echo tomorrow to ensure no fluid reaccumulation.    Dorn Calender, MD  [facility]

## 2024-08-13 NOTE — Treatment Plan (Signed)
 4 eyes in 4 hours pressure injury assessment note:      Completed with:   Unit & Time admitted:              Bony Prominences: Check appropriate box; if Pressure Injury is present enter Pressure Injury assessment in LDA    Occiput:                    []  Pressure Injury present  Face:                        []  Pressure Injury present  Ears:                         []  Pressure Injury present  Spine:                       []  Pressure Injury present  Shoulders:                []  Pressure Injury present  Elbows:                     []  Pressure Injury present  Sacrum/coccyx:        []  Pressure Injury present  Ischial Tuberosity:    []  Pressure Injury present  Trochanter/Hip:        []  Pressure Injury present  Knees:                      []  Pressure Injury present  Ankles:                     []  Pressure Injury present  Heels:                       []  Pressure Injury present  Other pressure areas: []  Pressure Injury location mid abdominal puncture site w/ drain       Device related: []  Device name:         LDA completed if pressure injury present: yes/no  Consult WOCN if necessary    Other skin related issues, ie tears, rash, etc, document in Integumentary flowsheet

## 2024-08-13 NOTE — Plan of Care (Signed)
 AOX4. MAE & FAC. No c/o of pain. SR, RBBB. HR: 80 - 110's. Levo remained off, SBP: 90 - 150's. RA, clear LS. Regular diet. No BM overnight. Excath, 850 cc. Skin intact.  Purposeful rounding completed. Safety & fall precautions remain in place.     Problem: Pain interferes with ability to perform ADL  Goal: Pain at adequate level as identified by patient  Outcome: Progressing  Flowsheets (Taken 08/12/2024 0521 by Laurann Corean Grief, RN)  Pain at adequate level as identified by patient:   Assess for risk of opioid induced respiratory depression, including snoring/sleep apnea. Alert healthcare team of risk factors identified.   Assess pain on admission, during daily assessment and/or before any as needed intervention(s)   Identify patient comfort function goal   Reassess pain within 30-60 minutes of any procedure/intervention, per Pain Assessment, Intervention, Reassessment (AIR) Cycle   Evaluate if patient comfort function goal is met   Offer non-pharmacological pain management interventions   Evaluate patient's satisfaction with pain management progress   Consult/collaborate with Pain Service     Problem: Compromised Activity/Mobility  Goal: Activity/Mobility Interventions  Outcome: Progressing  Flowsheets (Taken 08/12/2024 2000)  Activity/Mobility Interventions: Pad bony prominences, TAP Seated positioning system when OOB, Promote PMP, Reposition q 2 hrs / turn clock, Offload heels     Problem: Altered GI Function  Goal: Fluid and electrolyte balance are achieved/maintained  Outcome: Progressing  Flowsheets (Taken 08/12/2024 0521 by Laurann Corean Addai, RN)  Fluid and electrolyte balance are achieved/maintained:   Monitor/assess lab values and report abnormal values   Assess and reassess fluid and electrolyte status   Observe for cardiac arrhythmias   Monitor for muscle weakness     Problem: Hemodynamic Status: Cardiac  Goal: Stable vital signs and fluid balance  Outcome: Progressing  Flowsheets (Taken  08/12/2024 1003 by Jama Rosia Farrow, RN)  Stable vital signs and fluid balance:   Assess signs and symptoms associated with cardiac rhythm changes   Monitor lab values     Problem: Inadequate Tissue Perfusion  Goal: Adequate tissue perfusion will be maintained  Outcome: Progressing  Flowsheets (Taken 08/12/2024 1003 by Jama Rosia Farrow, RN)  Adequate tissue perfusion will be maintained:   Monitor/assess lab values and report abnormal values   Monitor/assess neurovascular status (pulses, capillary refill, pain, paresthesia, paralysis, presence of edema)   Monitor/assess for signs of VTE (edema of calf/thigh redness, pain)   Monitor for signs and symptoms of a pulmonary embolism (dyspnea, tachypnea, tachycardia, confusion)   Encourage/assist patient as needed to turn, cough, and perform deep breathing every 2 hours     Problem: Fluid and Electrolyte Imbalance/ Endocrine  Goal: Fluid and electrolyte balance are achieved/maintained  Outcome: Progressing  Flowsheets (Taken 08/12/2024 0521 by Laurann Corean Grief, RN)  Fluid and electrolyte balance are achieved/maintained:   Monitor/assess lab values and report abnormal values   Assess and reassess fluid and electrolyte status   Observe for cardiac arrhythmias   Monitor for muscle weakness     Problem: Safety  Goal: Patient will be free from injury during hospitalization  Outcome: Progressing  Flowsheets (Taken 08/13/2024 0128)  Patient will be free from injury during hospitalization:   Assess patient's risk for falls and implement fall prevention plan of care per policy   Provide and maintain safe environment   Use appropriate transfer methods   Ensure appropriate safety devices are available at the bedside   Include patient/ family/ care giver in decisions related to safety   Provide alternative  method of communication if needed [employer], writing)   Assess for patients risk for elopement and implement Elopement Risk Plan per policy   Hourly rounding      Problem: Pain  Goal: Pain at adequate level as identified by patient  Outcome: Progressing  Flowsheets (Taken 08/12/2024 0521 by Laurann Corean Grief, RN)  Pain at adequate level as identified by patient:   Assess for risk of opioid induced respiratory depression, including snoring/sleep apnea. Alert healthcare team of risk factors identified.   Assess pain on admission, during daily assessment and/or before any as needed intervention(s)   Identify patient comfort function goal   Reassess pain within 30-60 minutes of any procedure/intervention, per Pain Assessment, Intervention, Reassessment (AIR) Cycle   Evaluate if patient comfort function goal is met   Offer non-pharmacological pain management interventions   Evaluate patient's satisfaction with pain management progress   Consult/collaborate with Pain Service

## 2024-08-13 NOTE — Consults (Signed)
 HEMATOLOGY/ONCOLOGY HISTORY AND PHYSICAL  [facility]  [phone]    Patient Name: Helen [name] Day: 2     History of Present Illness:   Helen [name] is a 78 y.o. female with PMHx of breast cancer-s/p surgery-xrt-Tamoxifen 28 years ago, DM 2, HTN, HLD, bladder carcinoma, RBBB, CKD stage II, spinal stenosis, multiple viral illnesses in childhood (measles, mumps, chickenpox), and recent anemia, leukocytosis and thrombocytosis.     Presents on 08/11/2024 with progressive palpitations, nausea and decreased appetite.  In ED she was noted to be febrile with temperature 101 F, and sinus tachycardia with heart rate 110s to 120s, normal respiratory rate and oxygenating adequately on room air, GCS 15.      Chest x-ray revealed marked cardiomegaly concerning for pericardial effusion.  CTA of the chest abdomen pelvis revealed a large pericardial effusion with findings suggestive of pericarditis, trace left pleural fluid, postsurgical changes of the right breast, incidental finding of cholelithiasis and colonic diverticulosis, no evidence of aortic dissection.     Patient had pericardiocentesis. Fluid hemorrhagic. Cytology pending. Started colchicine .     She denies chest pain, dyspnea, dizziness, or lightheadedness.      Past Medical/Surgical History:   Medical History[1]  Past Surgical History[2]    FamilyHistory:   Family History[3]    Social History:   Social History[4]    Allergies:   Allergies[5]    Medications:   Scheduled Meds:Current Facility-Administered Medications[6]  Continuous Infusions:  PRN Meds:.acetaminophen , benzocaine -menthol , benzonatate , carboxymethylcellulose sodium, dextrose  **OR** dextrose  **OR** dextrose  **OR** glucagon  (rDNA), magnesium  sulfate, melatonin, naloxone , ondansetron  **OR** ondansetron , polyethylene glycol, potassium & sodium phosphates , potassium chloride  **OR** potassium chloride  **OR** potassium chloride , saline    Review of Systems:   A complete  10-point review of system was done and was negative or not contributory in details except for the above noted in the history of present illness.    Objective:   Physical Examination:  VITALS:   Vitals:    08/13/24 0800   BP: 125/58   Pulse: 87   Resp: 21   Temp:    SpO2: 95%     Intake/Output:   Intake/Output Summary (Last 24 hours) at 08/13/2024 1140  Last data filed at 08/13/2024 0930  Gross per 24 hour   Intake 2513.56 ml   Output 1170 ml   Net 1343.56 ml     GENERAL: No acute distress.  HEENT: Sclera anicteric.  Oropharynx clear.  No oral lesions.  HEART: Regular rate and rhythm.    LUNG: Clear to ascultation bilaterally.  No wheezes, rhonchi, rales.  ABDOMEN: Soft, nontender, nondistended.  No palpable mass.  EXTREMITIES: Warm and [name] perfused.  No peripheral edema.  NEURO: Alert and oriented x3.  No focal neurologic deficits.    Laboratory Data:     Results for orders placed or performed during the hospital encounter of 08/11/24 (from the past 24 hours)    Collection Time: 08/12/24 11:45 AM   Result Value    Whole Blood Glucose POCT 138 (H)   Nares, MRSA (methicillin-resistant Staphylococcus aureus) Screening, PCR    Collection Time: 08/12/24 11:50 AM    Specimen: Nares; Swab   Result Value    MRSA (methicillin resistant Staphylococcus aureus) DNA Not Detected    Collection Time: 08/12/24  3:57 PM   Result Value    Whole Blood Glucose POCT 158 (H)    Collection Time: 08/12/24  7:36 PM   Result Value    Whole Blood Glucose  POCT 152 (H)    Collection Time: 08/12/24 11:55 PM   Result Value    Whole Blood Glucose POCT 180 (H)   Basic Metabolic Panel    Collection Time: 08/13/24  3:41 AM   Result Value    Glucose 149 (H)    BUN 24 (H)    Creatinine 1.1 (H)    Calcium  7.8 (L)    Sodium 139    Potassium 3.8    Chloride 109    CO2 15 (L)    Anion Gap 15.0    GFR 50.8 (L)    Collection Time: 08/13/24  3:41 AM   Result Value    Magnesium  1.9    Collection Time: 08/13/24  3:41 AM   Result Value    Procalcitonin 0.90 (H)    CBC with Differential (Component)    Collection Time: 08/13/24  3:41 AM   Result Value    WBC 10.98 (H)    Hemoglobin 9.4 (L)    Hematocrit 29.6 (L)    Platelet Count 350 (H)    MPV 8.9    RBC 4.23    MCV 70.0 (L)    MCH 22.2 (L)    MCHC 31.8    RDW 14    nRBC % 0.0    Absolute nRBC 0.00    Preliminary Absolute Neutrophil Count 8.29 (H)    Neutrophils % 75.5    Lymphocytes % 8.8    Monocytes % 14.4    Eosinophils % 0.2    Basophils % 0.5    Immature Granulocytes % 0.6    Absolute Neutrophils 8.29 (H)    Absolute Lymphocytes 0.97    Absolute Monocytes 1.58 (H)    Absolute Eosinophils 0.02    Absolute Basophils 0.05    Absolute Immature Granulocytes 0.07    Collection Time: 08/13/24  4:20 AM   Result Value    Whole Blood Glucose POCT 134 (H)    Collection Time: 08/13/24  7:30 AM   Result Value    Whole Blood Glucose POCT 184 (H)        Imaging Data:   No results found. See chart    Assessment/Plan:   [name] is a 78 y.o. female with PMHx of breast cancer-s/p surgery-xrt-SERM 28 years ago, DM 2, HTN, HLD, bladder carcinoma, RBBB, CKD stage II, spinal stenosis, multiple viral illnesses in childhood (measles, mumps, chickenpox), and recent anemia, leukocytosis and thrombocytosis.   Admitted with pericardial effusion.    1- Pericardial effusion.  Malignant, vs viral or other benign etiology.  Cytology pending.  Of note CT showed no obvious evidence of metastatic disease.  Will check tumor markers. Recommendation to follow.     2- Anemia.  Iron levels within normal range.  Suspect of chronic disease.  Check reticulocytes and EPO level.      3- Leukocytosis and thrombocytosis. Reactive. Will send flow.  Will follow. May need BM biopsy in the future.    4- History of breast and bladder cancer.  Except for pericardial effusion, no obvious evidence to suggest possible recurrence.          Donley Southerly, MD, MD  Hematology and Medical Oncology  [facility]  P: [phone]  F:  [phone]  [email or web address]       [1]   Past Medical History:  Diagnosis Date    Anemia     iron    currently on medication      Arthritis  general body      Arthritis     Generalized    Asthma     Asthma without status asthmaticus     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    Bilateral cataracts     Breast cancer (CMS/HCC)     Breast lump     Cancer (CMS/HCC)     breast cancer    Carcinoma (CMS/HCC)     Carcinoma of the bladder    Chicken pox     Childhood illness    Conduction Disorder     RBBB    Diabetes mellitus type II     Non-Insulin  dependent    Echocardiogram 10/2013    Gastroesophageal reflux disease     Heart murmur     Hemorrhoids without complication     Holter monitor 02/2014    Hyperlipidemia     Hypertensive disorder     Malignant neoplasm of breast (CMS/HCC)     Measles     Childhood illness    Mumps     Childhood illness    Myocardial perfusion scan 11/2013, 12/2013    MPI single Isotope excrise; MPI dual Isotope Lexiscan     Rash     RBBB     Spinal stenosis     Stage 2 chronic kidney disease 10/29/2023    Syncope    [2]   Past Surgical History:  Procedure Laterality Date    ABLATION OF DYSRHYTHMIC FOCUS      ARTHROSCOPY, KNEE Left 04/21/2015    Procedure: ARTHROSCOPY, KNEE PARTIAL MEDIAL AND LATERAL MENISECTOMY LEFT KNEE;  Surgeon: Evie Shlomo CROME, MD;  Location: [facility];  Service: Orthopedics;  Laterality: Left;  partial medial menisectomy    BREAST BIOPSY      COLONOSCOPY, SCREENING N/A 12/07/2022    Procedure: COLONOSCOPY, SCREENING;  Surgeon: Vernia Mabel HERO, MD;  Location: [facility];  Service: Gastroenterology;  Laterality: N/A;    EGD N/A 12/07/2022    Procedure: EGD;  Surgeon: Vernia Mabel HERO, MD;  Location: [facility];  Service: Gastroenterology;  Laterality: N/A;    HAND SURGERY      HYSTERECTOMY      total    KNEE SURGERY      PERICARDIOCENTESIS N/A 08/12/2024    Procedure: Pericardiocentesis;  Surgeon: Jafir, Ahsan Q, DO;  Location: [facility];  Service: Cardiovascular;  Laterality: N/A;    TUMOR REMOVAL Left     neck  benign    [3]   Family History  Problem Relation Name Age of Onset    Diabetes Mother Kaelyn Innocent     Diabetes Sister Lincey Clyburn     Diabetes Sister Reena Oris     Malignant hyperthermia Neg Hx      Pseudochol deficiency Neg Hx      Anesthesia problems Neg Hx     [4]   Social History  Socioeconomic History    Marital status: Divorced   Tobacco Use    Smoking status: Never     Passive exposure: Never    Smokeless tobacco: Never   Vaping Use    Vaping status: Never Used   Substance and Sexual Activity    Alcohol use: No    Drug use: Never    Sexual activity: Not Currently     Social Drivers of Health     Financial Resource Strain: Low Risk (11/04/2023)    Overall Financial Resource Strain (CARDIA)     Difficulty  of Paying Living Expenses: Not hard at all   Food Insecurity: No Food Insecurity (08/12/2024)    Hunger Vital Sign     Worried About Running Out of Food in the Last Year: Never true     Ran Out of Food in the Last Year: Never true   Transportation Needs: No Transportation Needs (08/12/2024)    PRAPARE - Therapist, Art (Medical): No     Lack of Transportation (Non-Medical): No   Physical Activity: Insufficiently Active (11/04/2023)    Exercise Vital Sign     Days of Exercise per Week: 3 days     Minutes of Exercise per Session: 30 min   Stress: No Stress Concern Present (11/04/2023)    [employer] of Occupational Health - Occupational Stress Questionnaire     Feeling of Stress : Not at all   Social Connections: Socially Isolated (11/04/2023)    Social Connection and Isolation Panel     Frequency of Communication with Friends and Family: Once a week     Frequency of Social Gatherings with Friends and Family: Once a week     Attends Religious Services: More than 4 times per year     Active Member of [employer] or Organizations: No     Attends Banker Meetings: Never     Marital  Status: Divorced   Catering Manager Violence: Not At Risk (08/12/2024)    Humiliation, Afraid, Rape, and Kick questionnaire     Fear of Current or Ex-Partner: No     Emotionally Abused: No     Physically Abused: No     Sexually Abused: No   Housing Stability: At Risk (08/12/2024)    Housing Stability NCSS     Do you have housing?: No     Are you worried about losing your housing?: No   [5]   Allergies  Allergen Reactions    Aspirin      Break out in sweat and dizziness     Other Environmental     Peppers     Pollen Extract     Latex Itching and Rash   [6]   Current Facility-Administered Medications   Medication Dose Route Frequency    colchicine   0.6 mg Oral Daily    insulin  lispro  1-5 Units Subcutaneous Q4H SCH    [START ON 08/14/2024] pantoprazole   40 mg Oral QAM AC    piperacillin -tazobactam  4.5 g Intravenous Q8H    senna-docusate  1 tablet Oral Q12H [facility]

## 2024-08-13 NOTE — Consults (Addendum)
 Infectious Diseases and Tropical Medicine Consult  CHANDA RONAL DAD, MD          Date Time: 08/13/2024 4:51 PM  Patient Name: Helen [name]  Referring Physician: Delman Lonni BROCKS*      Reason for Consultation:     Pericarditis    Assessment:     Large pericardial effusion/tamponade-likely noninfectious  Post pericardiocentesis (08/12/2024)  Pericardial fluid no growth to date  Chronic kidney disease  Diabetes mellitus  Hyperlipidemia  History of breast and bladder cancer    Recommendations:     Continue antibiotics today and if fluid cultures negative by tomorrow will discontinue antibiotics  Continue probiotics  Continue colchicine   Monitor electrolytes and renal functions closely  Monitor clinically  Discussed with patient in detail                                                                 History of Present Illness:     Ms. Helen [name] is a 78 year old female with a history of diabetes mellitus, hypertension, hyperlipidemia, breast cancer, bladder cancer, chronic kidney disease, spinal stenosis is admitted since 08/11/2024 with generalized weakness, decreased appetite and palpitations.  Patient was noted to have fever of up to 101 degrees and sinus tachycardia.  Chest x-ray revealed marked cardiomegaly.  Patient was seen by cardiology and underwent pericardiocentesis on 08/12/2024 and is admitted to intensive care unit.  In the emergency room patient temperature is 98.3, blood pressure 88/50, pulse of 123 bpm, leukocyte count is 11.1, test for COVID-19 influenza screen is negative.  Fluid culture is no growth to date, AFB smears are negative, blood cultures are no growth to date.  Patient is started on Zosyn .    Medical History[1]     Past Surgical History[2]     Family History[3]     Social History[4]     Allergies:     Allergies[5]     Review of Systems:     General:  Comfortable, no fevers or chills, awake and alert, sitting in the chair  HEENT: no runny nose or sore throat  Respiratory: no  cough or shortness of breath, no hematemesis or hemoptysis  Cardiac: no chest pain  Abdomen: no abdominal pain, no nausea vomiting or diarrhea  Neurologic: awake and alert  Genitourinary: no dysuria or hematuria  Extremities: no joint pains or swelling  Dermatologic: no skin rashes, no itching    Physical Exam:     Blood pressure 126/66, pulse 97, temperature 98.9 F (37.2 C), temperature source Oral, resp. rate (!) 26, height 1.549 m (5' 1), weight 72.2 kg (159 lb 2.8 oz), SpO2 95%.    General Appearance: Awake  HEENT:  Head is normocephalic, atraumatic, pupils are equal and reactive to light  Neck:    Supple,No neck lymphadenopathy, no thyromegaly  Lungs:  Clear to auscultation   Chest Wall: Symmetric chest wall expansion.   Heart : Regular rate and rhythm, no murmur or gallop  Abdomen: Abdomen is soft, nontender, good bowel sounds, no hepatosplenomegaly  Neurological: Awake and alert, normal muscle strength, no focal deficit  Extremities: No edema, no clubbing or cyanosis  Skin:  Warm and dry.No rash or ecchymosis.   Psychiatric: Mood and affect is normal    Labs:     Recent  Labs     08/13/24  0341 08/12/24  0239   WBC 10.98* 10.43*   Hemoglobin 9.4* 10.1*   Hematocrit 29.6* 31.4*   Platelet Count 350* 352*   MCV 70.0* 70.2*       Recent Labs     08/13/24  0341 08/12/24  0239 08/11/24  1934   Sodium 139 139 134*   Potassium 3.8 4.0 3.8   Chloride 109 102 96*   CO2 15* 21 23   BUN 24* 12 14   Creatinine 1.1* 0.8 0.9   Glucose 149* 92 109*   Calcium  7.8* 8.7 8.7   Magnesium  1.9  --  1.8       Recent Labs     08/11/24  1934   AST (SGOT) 31   ALT 15   Alkaline Phosphatase 85   Protein, Total 8.0   Albumin 3.3*   Bilirubin, Total 0.9         Imaging studies:           Thanks for the consultation          Signed by: Chanda DELENA Dad, MD, MD  Date Time: 08/13/2024 4:51 PM    Total time spent for the whole consult : [75 min.]    *This note was generated by the Epic EMR system/ Dragon speech recognition and may  contain inherent errors or omissions not intended by the user. Grammatical errors, random word insertions, deletions, pronoun errors and incomplete sentences are occasional consequences of this technology due to software limitations. Not all errors are caught or corrected. 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    Bilateral cataracts     Breast cancer (CMS/HCC)     Breast lump     Cancer (CMS/HCC)     breast cancer    Carcinoma (CMS/HCC)     Carcinoma of the bladder    Chicken pox     Childhood illness    Conduction Disorder     RBBB    Diabetes mellitus type II     Non-Insulin  dependent    Echocardiogram 10/2013    Gastroesophageal reflux disease     Heart murmur     Hemorrhoids without complication     Holter monitor 02/2014    Hyperlipidemia     Hypertensive disorder     Malignant neoplasm of breast (CMS/HCC)     Measles     Childhood illness    Mumps     Childhood illness    Myocardial perfusion scan 11/2013, 12/2013    MPI single Isotope excrise; MPI dual Isotope Lexiscan     Rash     RBBB     Spinal stenosis     Stage 2 chronic kidney disease 10/29/2023    Syncope    [2]   Past Surgical History:  Procedure Laterality Date    ABLATION OF DYSRHYTHMIC  FOCUS      ARTHROSCOPY, KNEE Left 04/21/2015    Procedure: ARTHROSCOPY, KNEE PARTIAL MEDIAL AND LATERAL MENISECTOMY LEFT KNEE;  Surgeon: Evie Shlomo CROME, MD;  Location: [facility];  Service: Orthopedics;  Laterality: Left;  partial medial menisectomy    BREAST BIOPSY      COLONOSCOPY, SCREENING N/A 12/07/2022    Procedure: COLONOSCOPY, SCREENING;   Surgeon: Vernia Mabel HERO, MD;  Location: [facility];  Service: Gastroenterology;  Laterality: N/A;    EGD N/A 12/07/2022    Procedure: EGD;  Surgeon: Vernia Mabel HERO, MD;  Location: [facility];  Service: Gastroenterology;  Laterality: N/A;    HAND SURGERY      HYSTERECTOMY      total    KNEE SURGERY      PERICARDIOCENTESIS N/A 08/12/2024    Procedure: Pericardiocentesis;  Surgeon: Jafir, Ahsan Q, DO;  Location[facility];  Service: Cardiovascular;  Laterality: N/A;    TUMOR REMOVAL Left     neck  benign    [3]   Family History  Problem Relation Name Age of Onset    Diabetes Mother Allyiah Gartner     Diabetes Sister Lincey Clyburn     Diabetes Sister Reena Oris     Malignant hyperthermia Neg Hx      Pseudochol deficiency Neg Hx      Anesthesia problems Neg Hx     [4]   Social History  Socioeconomic History    Marital status: Divorced   Tobacco Use    Smoking status: Never     Passive exposure: Never    Smokeless tobacco: Never   Vaping Use    Vaping status: Never Used   Substance and Sexual Activity    Alcohol use: No    Drug use: Never    Sexual activity: Not Currently     Social Drivers of Health     Financial Resource Strain: Low Risk (11/04/2023)    Overall Financial Resource Strain (CARDIA)     Difficulty of Paying Living Expenses: Not hard at all   Food Insecurity: No Food Insecurity (08/12/2024)    Hunger Vital Sign     Worried About Running Out of Food in the Last Year: Never true     Ran Out of Food in the Last Year: Never true   Transportation Needs: No Transportation Needs (08/12/2024)    PRAPARE - Therapist, Art (Medical): No     Lack of Transportation (Non-Medical): No   Physical Activity: Insufficiently Active (11/04/2023)    Exercise Vital Sign     Days of Exercise per Week: 3 days     Minutes of Exercise per Session: 30 min   Stress: No Stress Concern Present (11/04/2023)    [employer] of Occupational Health - Occupational Stress Questionnaire     Feeling of  Stress : Not at all   Social Connections: Socially Isolated (11/04/2023)    Social Connection and Isolation Panel     Frequency of Communication with Friends and Family: Once a week     Frequency of Social Gatherings with Friends and Family: Once a week     Attends Religious Services: More than 4 times per year     Active Member of [employer] or Organizations: No     Attends Banker Meetings: Never     Marital Status: Divorced   Catering Manager Violence: Not At Risk (08/12/2024)    Humiliation, Afraid, Rape, and Kick questionnaire  Fear of Current or Ex-Partner: No     Emotionally Abused: No     Physically Abused: No     Sexually Abused: No   Housing Stability: At Risk (08/12/2024)    Housing Stability NCSS     Do you have housing?: No     Are you worried about losing your housing?: No   [5]   Allergies  Allergen Reactions    Aspirin      Break out in sweat and dizziness     Other Environmental     Peppers     Pollen Extract     Latex Itching and Rash

## 2024-08-14 ENCOUNTER — Encounter: Payer: Self-pay | Admitting: Family Medicine

## 2024-08-14 ENCOUNTER — Ambulatory Visit: Admitting: Family Medicine

## 2024-08-14 ENCOUNTER — Inpatient Hospital Stay

## 2024-08-14 ENCOUNTER — Telehealth (INDEPENDENT_AMBULATORY_CARE_PROVIDER_SITE_OTHER): Payer: Self-pay

## 2024-08-14 VITALS — BP 136/76 | HR 64 | Temp 97.7°F | Ht 67.0 in

## 2024-08-14 DIAGNOSIS — N179 Acute kidney failure, unspecified: Secondary | ICD-10-CM

## 2024-08-14 DIAGNOSIS — I3139 Other pericardial effusion (noninflammatory): Principal | ICD-10-CM | POA: Diagnosis present

## 2024-08-14 DIAGNOSIS — R579 Shock, unspecified: Secondary | ICD-10-CM

## 2024-08-14 DIAGNOSIS — J069 Acute upper respiratory infection, unspecified: Secondary | ICD-10-CM

## 2024-08-14 LAB — LAB USE ONLY - CBC WITH DIFFERENTIAL
Absolute Basophils: 0.04 x10 3/uL (ref 0.00–0.08)
Absolute Eosinophils: 0.03 x10 3/uL (ref 0.00–0.44)
Absolute Immature Granulocytes: 0.06 x10 3/uL (ref 0.00–0.07)
Absolute Lymphocytes: 1.5 x10 3/uL (ref 0.42–3.22)
Absolute Monocytes: 1.48 x10 3/uL — ABNORMAL HIGH (ref 0.21–0.85)
Absolute Neutrophils: 8.22 x10 3/uL — ABNORMAL HIGH (ref 1.10–6.33)
Absolute nRBC: 0 x10 3/uL (ref <=0.00)
Basophils %: 0.4 %
Eosinophils %: 0.3 %
Hematocrit: 32.2 % — ABNORMAL LOW (ref 34.7–43.7)
Hemoglobin: 10.3 g/dL — ABNORMAL LOW (ref 11.4–14.8)
Immature Granulocytes %: 0.5 %
Lymphocytes %: 13.2 %
MCH: 22.4 pg — ABNORMAL LOW (ref 25.1–33.5)
MCHC: 32 g/dL (ref 31.5–35.8)
MCV: 70 fL — ABNORMAL LOW (ref 78.0–96.0)
MPV: 9.6 fL (ref 8.9–12.5)
Monocytes %: 13.1 %
Neutrophils %: 72.5 %
Platelet Count: 339 x10 3/uL (ref 142–346)
Preliminary Absolute Neutrophil Count: 8.22 x10 3/uL — ABNORMAL HIGH (ref 1.10–6.33)
RBC: 4.6 x10 6/uL (ref 3.90–5.10)
RDW: 14 % (ref 11–15)
WBC: 11.33 x10 3/uL — ABNORMAL HIGH (ref 3.10–9.50)
nRBC %: 0 /100{WBCs} (ref <=0.0)

## 2024-08-14 LAB — ERYTHROPOIETIN: Erythropoietin: 56.8 m[IU]/mL — ABNORMAL HIGH (ref 2.6–18.5)

## 2024-08-14 LAB — BASIC METABOLIC PANEL
Anion Gap: 11 (ref 5.0–15.0)
BUN: 19 mg/dL (ref 7–21)
CO2: 18 meq/L (ref 17–29)
Calcium: 8.1 mg/dL (ref 7.9–10.2)
Chloride: 110 meq/L (ref 99–111)
Creatinine: 0.8 mg/dL (ref 0.4–1.0)
GFR: >60 mL/min/1.73 m2 (ref >=60.0)
Glucose: 145 mg/dL — ABNORMAL HIGH (ref 70–100)
Potassium: 4.8 meq/L (ref 3.5–5.3)
Sodium: 139 meq/L (ref 135–145)

## 2024-08-14 LAB — MEDICAL CYTOLOGY

## 2024-08-14 LAB — ECHO ADULT TTE LIMITED
Mitral Valve Findings: NORMAL
Tricuspid Valve Findings: NORMAL
Visually Estimated Ejection Fraction: 60

## 2024-08-14 LAB — MAGNESIUM: Magnesium: 2.4 mg/dL (ref 1.6–2.6)

## 2024-08-14 LAB — WHOLE BLOOD GLUCOSE POCT
Whole Blood Glucose POCT: 149 mg/dL — ABNORMAL HIGH (ref 70–100)
Whole Blood Glucose POCT: 170 mg/dL — ABNORMAL HIGH (ref 70–100)
Whole Blood Glucose POCT: 239 mg/dL — ABNORMAL HIGH (ref 70–100)
Whole Blood Glucose POCT: 137 mg/dL — ABNORMAL HIGH (ref 70–100)
Whole Blood Glucose POCT: 255 mg/dL — ABNORMAL HIGH (ref 70–100)

## 2024-08-14 MED ORDER — HYDROCODONE BIT-HOMATROP MBR 5-1.5 MG/5ML PO SOLN: 5.0000 mL | Freq: Three times a day (TID) | ORAL | 0 refills | Status: AC | PRN

## 2024-08-14 MED ORDER — ATORVASTATIN CALCIUM 40 MG PO TABS
40.0000 mg | ORAL_TABLET | Freq: Every evening | ORAL | Status: DC
  Administered 2024-08-14: 40 mg via ORAL
  Filled 2024-08-14: qty 1

## 2024-08-14 MED ORDER — SODIUM CHLORIDE 0.9 % IV MBP
4.5000 g | Freq: Four times a day (QID) | INTRAVENOUS | Status: DC
  Administered 2024-08-14: 4.5 g via INTRAVENOUS
  Filled 2024-08-14 (×2): qty 22.2

## 2024-08-14 NOTE — Progress Notes (Signed)
 Subjective:    Patient ID: April Beck, female    DOB: 10-22-1945, 78 y.o.   MRN: [id number]  Symptoms began Monday.  Symptoms include sore throat, rhinorrhea, head congestion.  She denies any fever.  She has a cough that is nonproductive.  She denies any shortness of breath.  She denies any pleurisy.  She denies any sinus pain or otalgia.  She continues to have a sore throat and a persistent cough.  She has a lot of postnasal drip.  Past Medical History:  Diagnosis Date   Anxiety    hx of panic attack   Arthritis 2013   hands & knees   CKD (chronic kidney disease), stage III (HCC)    followed by pcp   Closed fracture of left distal radius 10/31/2021   Edema of both lower extremities    GERD (gastroesophageal reflux disease) 2013   History of 2019 novel coronavirus disease (COVID-19) 09/17/2020   positive result in epic,  per pt very mild symtpoms that resolved   History of uterine fibroid    Hypertension 2023   followed by pcp   Hypomagnesemia    Hypothyroidism    followed by pcp   IDA (iron deficiency anemia)    Osteopenia    Ovarian cyst    Pelvic pain    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, takes meloxicam    Wears contact lenses    Past Surgical History:  Procedure Laterality Date   ABDOMINAL HYSTERECTOMY  1982   CATARACT EXTRACTION Bilateral    CHOLECYSTECTOMY  06/20/2012   Procedure: LAPAROSCOPIC CHOLECYSTECTOMY;  Surgeon: Dann FORBES Hummer, MD;  Location: [facility];  Service: General;  Laterality: N/A;   COLONOSCOPY  last one 03-20-2019  dr teressa   DIAGNOSTIC LAPAROSCOPY  yrs ago   EYE SURGERY  09/27/22   Cararact removal and lens implant   HIP ARTHROPLASTY Right 11/13/2020   Procedure: ARTHROPLASTY BIPOLAR HIP (HEMIARTHROPLASTY);  Surgeon: Margrette Taft FORBES, MD;  Location: [facility];  Service: Orthopedics;  Laterality: Right;   INCONTINENCE SURGERY  09-11-2005   dr watt  @[facility]    LYNX SLING   JOINT REPLACEMENT  11/12/20   LAPAROSCOPIC  SALPINGO OOPHERECTOMY Bilateral 01/16/2021   Procedure: DIAGNOSTIC LAPAROSCOPY;  Surgeon: Johnnye Ade, MD;  Location: [facility];  Service: Gynecology;  Laterality: Bilateral;   LAPAROSCOPY Bilateral 01/16/2021   Procedure: EXPLORATORY LAPAROTOMY BILATERAL SALPINGO OOPHORECTOMY;  Surgeon: Johnnye Ade, MD;  Location: [facility];  Service: Gynecology;  Laterality: Bilateral;   TUBAL LIGATION     Current Outpatient Medications on File Prior to Visit  Medication Sig Dispense Refill   acetaminophen  (TYLENOL ) 325 MG tablet Take 2 tablets (650 mg total) by mouth every 6 (six) hours as needed for pain.     acyclovir  (ZOVIRAX ) 400 MG tablet TAKE 1 TABLET EVERY DAY 90 tablet 3   atorvastatin  (LIPITOR) 40 MG tablet TAKE 1 TABLET EVERY DAY 90 tablet 3   calcium -vitamin D  (OSCAL WITH D) 250-125 MG-UNIT tablet Take 1 tablet by mouth daily.     ciclopirox  (PENLAC ) 8 % solution Apply topically at bedtime. Apply over nail and surrounding skin. Apply daily over previous coat. After seven (7) days, may remove with alcohol and continue cycle. 6.6 mL 2   citalopram  (CELEXA ) 10 MG tablet TAKE 1 TABLET EVERY DAY 90 tablet 3   clobetasol (TEMOVATE) 0.05 % external solution Apply topically 2 (two) times daily as needed.  diphenoxylate -atropine  (LOMOTIL ) 2.5-0.025 MG tablet TAKE 2 TABLETS BY MOUTH 4 (FOUR) TIMES DAILY AS NEEDED FOR DIARRHEA OR LOOSE STOOLS. 30 tablet 0   estradiol  (ESTRACE  VAGINAL) 0.1 MG/GM vaginal cream Place 1 Applicatorful vaginally at bedtime. 42.5 g 3   fluconazole  (DIFLUCAN ) 150 MG tablet Take 1 tablet (150 mg total) by mouth daily. (Patient taking differently: Take 150 mg by mouth as needed.) 2 tablet 0   fluocinonide gel (LIDEX) 0.05 % Apply 1 application topically 3 (three) times daily as needed (rash).     fluticasone  (CUTIVATE ) 0.05 % cream Apply topically 2 (two) times daily as needed.     levothyroxine  (SYNTHROID ) 50 MCG tablet TAKE 1 TABLET EVERY  DAY 90 tablet 3   losartan  (COZAAR ) 50 MG tablet TAKE 1 TABLET EVERY DAY 90 tablet 3   meclizine  (ANTIVERT ) 25 MG tablet Take 1 tablet (25 mg total) by mouth 3 (three) times daily as needed. 30 tablet 0   pantoprazole  (PROTONIX ) 40 MG tablet TAKE 1 TABLET TWICE DAILY 180 tablet 3   Tavaborole  (KERYDIN ) 5 % SOLN Apply 1 drop topically daily. Apply 1 drop to the toenail daily. 10 mL 2   tretinoin (RETIN-A) 0.025 % cream Apply topically at bedtime.     triamcinolone  cream (KENALOG ) 0.1 % APPLY TO AFFECTED AREA TWICE A DAY     No current facility-administered medications on file prior to visit.   No Known Allergies Social History   Socioeconomic History   Marital status: Divorced    Spouse name: Not on file   Number of children: Not on file   Years of education: Not on file   Highest education level: 12th grade  Occupational History   Not on file  Tobacco Use   Smoking status: Never    Passive exposure: Past   Smokeless tobacco: Never  Vaping Use   Vaping status: Never Used  Substance and Sexual Activity   Alcohol use: No   Drug use: Never   Sexual activity: Not Currently    Birth control/protection: Surgical  Other Topics Concern   Not on file  Social History Narrative   Not on file   Social Drivers of Health   Tobacco Use: Low Risk (08/14/2024)   Patient History    Smoking Tobacco Use: Never    Smokeless Tobacco Use: Never    Passive Exposure: Past  Financial Resource Strain: Low Risk (07/06/2024)   Overall Financial Resource Strain (CARDIA)    Difficulty of Paying Living Expenses: Not very hard  Recent Concern: Financial Resource Strain - Medium Risk (04/15/2024)   Overall Financial Resource Strain (CARDIA)    Difficulty of Paying Living Expenses: Somewhat hard  Food Insecurity: No Food Insecurity (07/06/2024)   Epic    Worried About Radiation Protection Practitioner of Food in the Last Year: Never true    Ran Out of Food in the Last Year: Never true  Transportation Needs: No  Transportation Needs (07/06/2024)   Epic    Lack of Transportation (Medical): No    Lack of Transportation (Non-Medical): No  Physical Activity: Inactive (04/15/2024)   Exercise Vital Sign    Days of Exercise per Week: 0 days    Minutes of Exercise per Session: 0 min  Stress: No Stress Concern Present (07/06/2024)   [employer] of Occupational Health - Occupational Stress Questionnaire    Feeling of Stress: Not at all  Social Connections: Moderately Integrated (07/06/2024)   Social Connection and Isolation Panel    Frequency of Communication with Friends  and Family: Three times a week    Frequency of Social Gatherings with Friends and Family: Three times a week    Attends Religious Services: More than 4 times per year    Active Member of Clubs or Organizations: Yes    Attends Banker Meetings: Patient declined    Marital Status: Divorced  Intimate Partner Violence: Not At Risk (04/15/2024)   Epic    Fear of Current or Ex-Partner: No    Emotionally Abused: No    Physically Abused: No    Sexually Abused: No  Depression (PHQ2-9): Low Risk (04/15/2024)   Depression (PHQ2-9)    PHQ-2 Score: 0  Alcohol Screen: Low Risk (04/15/2024)   Alcohol Screen    Last Alcohol Screening Score (AUDIT): 0  Housing: Low Risk (07/06/2024)   Epic    Unable to Pay for Housing in the Last Year: No    Number of Times Moved in the Last Year: 0    Homeless in the Last Year: No  Utilities: Not At Risk (04/15/2024)   Epic    Threatened with loss of utilities: No  Health Literacy: Adequate Health Literacy (04/15/2024)   B1300 Health Literacy    Frequency of need for help with medical instructions: Never       Review of Systems  All other systems reviewed and are negative.      Objective:   Physical Exam Vitals reviewed.  Constitutional:      General: She is not in acute distress.    Appearance: She is well-developed. She is not diaphoretic.  HENT:     Head: Normocephalic and  atraumatic.     Right Ear: Tympanic membrane, ear canal and external ear normal. Tympanic membrane is not erythematous.     Left Ear: Tympanic membrane, ear canal and external ear normal. Tympanic membrane is not erythematous.     Nose: Rhinorrhea present.     Mouth/Throat:     Mouth: Mucous membranes are moist.     Pharynx: No oropharyngeal exudate or posterior oropharyngeal erythema.     Tonsils: No tonsillar exudate.  Eyes:     General: No scleral icterus.       Right eye: No discharge.        Left eye: No discharge.     Conjunctiva/sclera: Conjunctivae normal.     Pupils: Pupils are equal, round, and reactive to light.  Neck:     Thyroid : No thyromegaly.     Vascular: No JVD.     Trachea: No tracheal deviation.  Cardiovascular:     Rate and Rhythm: Normal rate and regular rhythm.     Heart sounds: Normal heart sounds. No murmur heard.    No friction rub. No gallop.  Pulmonary:     Effort: Pulmonary effort is normal. No respiratory distress.     Breath sounds: Normal breath sounds. No stridor. No wheezing or rales.  Chest:     Chest wall: No tenderness.  Abdominal:     General: Bowel sounds are normal. There is no distension.     Palpations: Abdomen is soft. There is no mass.     Tenderness: There is no abdominal tenderness. There is no guarding or rebound.  Musculoskeletal:        General: No tenderness or deformity.     Cervical back: Normal range of motion and neck supple.  Lymphadenopathy:     Cervical: No cervical adenopathy.  Skin:    General: Skin is warm.  Coloration: Skin is not pale.     Findings: No erythema or rash.  Neurological:     Mental Status: She is alert and oriented to person, place, and time.     Cranial Nerves: No cranial nerve deficit.     Motor: No abnormal muscle tone.     Coordination: Coordination normal.     Deep Tendon Reflexes: Reflexes are normal and symmetric. Reflexes normal.  Psychiatric:        Behavior: Behavior normal.         Thought Content: Thought content normal.        Judgment: Judgment normal.           Assessment & Plan:  Viral upper respiratory tract infection Exam today is consistent with a viral upper respiratory infection.  I recommended using Tylenol  or ibuprofen  for sore throat and fever and bodyaches.  She can use Hycodan 1 teaspoon every 6 hours as needed for cough.  She can use Coricidin as needed for head congestion.  Anticipate gradual self-limited resolution over the next 4 to 5 days

## 2024-08-14 NOTE — H&P (Signed)
 [facility]  Internal Medicine Hospitalists  History and Physical        Assessment / Plan:        Helen [name] is a 78 y.o. female with past medical history of hypertension, edema, CKD, type 2 diabetes, breast cancer who presented with palpitations, nausea and decreased appetite, found to be tachycardic, hypotensive found to have large pericardial effusion    Pericardial effusion, s/p pericardiocentesis   chest x-ray with markedly enlarged cardiac silhouette, CT with large pericardial effusion.  Transferred to [facility], patient became hypotensive.  Stat echo with large pericardial effusion without evidence of tamponade.  Pericardiocentesis 12/10 performed and drain left in place.  Fluid hemorrhagic.  Repeat echocardiogram without evidence of recurrence, drain removed.  Follow-up echocardiogram 12/12 again without effusion.  - Unclear etiology.  Oncology consulted for possible malignant cause.  Cytology negative.  Imaging without evidence of primary.  Possibly infectious cause, fluid cultures no growth to date.  Query pericarditis.  - Continue colchicine    - Outpatient follow-up with cardiology and repeat echocardiogram within 1 week  - Avoid blood thinners    Undifferentiated shock, resolved  Initially thought to be in setting of pericardial effusion.  Monitored in ICU after pericardiocentesis with with persistent vasopressor requirement, unclear etiology given resolution of pericardial effusion.  Concern for possible septic shock.  Started on IV antibiotics empirically.  UA noninfectious appearing.  CT chest/abdomen/pelvis without evidence of localizing source of infection.  Blood and pericardial fluid cultures negative.  Able to be weaned off IV vasopressors  - Infectious disease consulted, pending stability may be able to transition off antibiotic    AKI on CKD  Baseline creatinine 0.8-1, mild AKI 12/11.  Resolved    Hypertension  Holding antihypertensives in setting of  shock    Hyperlipidemia  Continue atorvastatin     SVT  Chronic RBBB  Continue metoprolol     Anemia of chronic disease  Outpatient follow-up    Type 2 Diabetes with Hyperglycemia  Holding home antidiabetic agents  -Monitoring for hypo/hyperglycemia on correctional lispro    History of breast cancer  Outpatient follow-up             VTE Prophylaxis: Patient has contraindication to chemical VTE prophylaxis     Foley Catheter: No Foley Present    Venous Access: No Temporary Central Line Present    Medical Readiness for Discharge: Anticipated Tomorrow    Open Handoff Activity in Sidebar    Additional Diagnoses:     Patient has a BMI of 29.91 kg/m2    Overweight: BMI of 25 to 29.9     Recent Labs   Lab 08/14/24  0338 08/13/24  0341 08/12/24  0239   Hemoglobin 10.3* 9.4* 10.1*   Hematocrit 32.2* 29.6* 31.4*   MCV 70.0* 70.0* 70.2*   WBC 11.33* 10.98* 10.43*   Platelet Count 339 350* 352*     Recent Labs   Lab 05/12/24  0000   Ferritin 358*   Iron 60   TIBC 288   Iron Saturation 21     Anemia Diagnosis: Acute: Acute Anemia, Unspecified     Laboratory Evidence of Acidosis:  Recent Labs   Lab 08/14/24  0338 08/13/24  0341 08/12/24  0239   CO2 18 15* 21        Acidosis Diagnosis: Metabolic Acidosis       HPI:        Helen [name] is a 78 y.o. female with past medical history of  hypertension, edema, CKD, type 2 diabetes, breast cancer who presented with palpitations, nausea and decreased appetite, found to be tachycardic, hypotensive found to have large pericardial effusion.  Initially presented to [facility] with palpitations, fever.  POCUS found moderate pericardial effusion, chest x-ray with markedly enlarged cardiac silhouette, CT with large pericardial effusion.  Transferred to [facility], patient became hypotensive.  Stat echo with large pericardial effusion without evidence of tamponade.  Pericardiocentesis performed and drain left in place.  Monitored in ICU with persistent vasopressor requirement, unclear etiology  given resolution of pericardial effusion.  Concern for possible septic shock.  Started on IV antibiotics.  ICU course complicated by SVT, started on metoprolol  with improvement.  Repeat echocardiogram without evidence of recurrence, drain removed.  Follow-up echocardiogram again without effusion, stable for transfer to floor 12/12         Past Medical / Surgical / Family / Social History:     Medical History[1]  Past Surgical History[2]   Family History[3]  Social History[4]    Allergies and Home Medications:     Allergies[5]  Home Medications       Med List Status: RN Completed Set By: Laurann Corean Grief, RN at 08/12/2024 12:37 AM              albuterol  (PROVENTIL ) (2.5 MG/3ML) 0.083% nebulizer solution     TAKE 3 MLS (2.5 MG TOTAL) BY NEBULIZATION EVERY 4 (FOUR) HOURS AS NEEDED FOR SHORTNESS OF BREATH OR WHEEZING     albuterol  sulfate HFA (PROVENTIL ) 108 (90 Base) MCG/ACT inhaler     INHALE 2 PUFFS INTO THE LUNGS EVERY 4 HOURS AS NEEDED FOR WHEEZING OR SHORTNESS OF BREATH.     amLODIPine  (NORVASC ) 10 MG tablet     TAKE 1 TABLET (10 MG) BY MOUTH DAILY.     Patient taking differently: Take 1 tablet (10 mg) by mouth once daily     atorvastatin  (LIPITOR) 40 MG tablet     TAKE 1 TABLET BY MOUTH EVERY DAY     Patient taking differently: Take 1 tablet (40 mg) by mouth once daily     Azelastine  HCl 137 MCG/SPRAY Solution     2 SPRAYS BY NASAL ROUTE 2 (TWO) TIMES DAILY USE IN EACH NOSTRIL AS DIRECTED     Blood Glucose Monitoring Suppl (ONE TOUCH ULTRA 2) W/DEVICE KIT     One daily     cetirizine (ZyrTEC) 10 MG tablet     Take 1 tablet (10 mg) by mouth once daily     Cholecalciferol (Vitamin D3) 2000 UNIT capsule     TAKE 1 CAPSULE BY MOUTH EVERY DAY     Patient taking differently: Take by mouth once daily     dexAMETHasone (DECADRON) 0.1 % ophthalmic solution     Place 1 drop into both eyes     diazePAM  (VALIUM ) 5 MG tablet     Take 1 tablet (5 mg) by mouth every 6 (six) hours as needed (back pain)     EPINEPHrine   (EPIPEN  2-PAK) 0.3 MG/0.3ML Solution Auto-injector injection     Inject 0.3 mLs (0.3 mg) into the muscle once as needed (Anaphylaxis)     esomeprazole  (NexIUM ) 40 MG capsule     Take 1 capsule (40 mg) by mouth once daily     estradiol (ESTRACE) 0.01 % vaginal cream          ferrous sulfate 325 (65 FE) MG tablet     Take 1 tablet (325 mg) by mouth every  morning with breakfast     fluticasone  (FLONASE ) 50 MCG/ACT nasal spray     2 sprays by Nasal route once daily     fluticasone  (FLOVENT  HFA) 220 MCG/ACT inhaler     INHALE 1 PUFF INTO THE LUNGS TWICE A DAY     glipiZIDE  (GLUCOTROL ) 10 MG tablet (Expired)     Take 2 tablets (20 mg) by mouth 2 (two) times daily before meals     Glucagon , rDNA, (Glucagon  Emergency) 1 MG Kit     Inject 1 mg into the skin once as needed (severe hypoglycemia)     hydrocortisone  (ANUSOL -HC) 25 MG suppository     PLACE 1 SUPPOSITORY (25 MG) RECTALLY 2 (TWO) TIMES DAILY- NOT COVERED     hydrOXYzine (ATARAX) 25 MG tablet     PLEASE SEE ATTACHED FOR DETAILED DIRECTIONS     Jardiance  25 MG tablet     TAKE 1 TABLET BY MOUTH EVERY DAY IN THE MORNING     Lancets (onetouch ultrasoft) lancets     Use to check blood sugars 2 times daily     lidocaine  (Lidoderm ) 5 %     Place 1 patch onto the skin once daily as needed (pain) Remove & Discard patch within 12 hours     linaGLIPtin  (Tradjenta ) 5 MG Tablet     TAKE 1 TABLET BY MOUTH EVERY DAY     Patient taking differently: Take 1 tablet (5 mg) by mouth once daily     meloxicam  (Mobic ) 7.5 MG tablet     Take 1 tablet (7.5 mg) by mouth once daily as needed for Pain     metFORMIN  (GLUCOPHAGE -XR) 500 MG 24 hr tablet     TAKE 2 TABLETS (1,000 MG) BY MOUTH TWICE A DAY     montelukast (SINGULAIR) 10 MG tablet     Take 1 tablet (10 mg) by mouth once daily     NASONEX 50 MCG/ACT nasal spray     2 sprays by Nasal route once daily as needed     ondansetron  (ZOFRAN -ODT) 4 MG disintegrating tablet     Dissolve 1 tablet (4 mg) in the mouth every 6 (six) hours as needed  for Nausea     OneTouch Ultra test strip     TWICE A DAY     valsartan -hydroCHLOROthiazide  (DIOVAN -HCT) 160-12.5 MG per tablet     TAKE 1 TABLET BY MOUTH EVERY DAY     Patient taking differently: Take 1 tablet by mouth once daily            Review of Systems:   All other systems were reviewed and negative except as described above.        Objective:      Patient Vitals for the past 24 hrs:   BP Temp Temp src Pulse Resp SpO2 Weight   08/14/24 1619 132/80 97.8 F (36.6 C) Oral -- 18 100 % --   08/14/24 1400 113/67 -- -- 85 (!) 24 98 % --   08/14/24 1200 159/90 97.6 F (36.4 C) Oral 84 22 98 % --   08/14/24 1100 111/64 -- -- 78 21 98 % --   08/14/24 1000 119/70 -- -- 88 18 98 % --   08/14/24 0900 125/64 -- -- 85 21 98 % --   08/14/24 0857 122/59 -- -- 93 -- -- --   08/14/24 0800 -- 97.5 F (36.4 C) Axillary -- -- -- --   08/14/24 0700 -- -- -- 94 (!) 25 96 % --  08/14/24 0600 140/65 -- -- 86 19 93 % --   08/14/24 0400 100/56 98.1 F (36.7 C) Oral 84 20 93 % 71.8 kg (158 lb 4.6 oz)   08/14/24 0338 130/64 -- -- 83 22 96 % --   08/14/24 0300 (!) 78/41 -- -- 84 19 91 % --   08/14/24 0200 104/53 -- -- 84 (!) 23 94 % --   08/14/24 0100 114/57 -- -- 82 18 94 % --   08/14/24 0010 -- -- -- 82 (!) 25 93 % --   08/14/24 0000 102/55 98.1 F (36.7 C) Oral 82 (!) 31 94 % --   08/13/24 2330 -- -- -- 84 21 93 % --   08/13/24 2300 113/60 -- -- 88 21 94 % --   08/13/24 2200 123/59 -- -- 95 20 95 % --   08/13/24 2100 128/76 -- -- 96 (!) 23 97 % --   08/13/24 2000 122/74 98.1 F (36.7 C) Oral 91 22 97 % --   08/13/24 1900 138/75 -- -- 86 (!) 23 97 % --   08/13/24 1815 111/66 -- -- 83 (!) 24 96 % --        Physical Exam  Vitals and nursing note reviewed.   Constitutional:       General: She is not in acute distress.     Appearance: Normal appearance.   HENT:      Head: Normocephalic and atraumatic.   Cardiovascular:      Rate and Rhythm: Normal rate and regular rhythm.   Pulmonary:      Effort: Pulmonary effort is normal. No  respiratory distress.      Breath sounds: Normal breath sounds.   Skin:     General: Skin is warm and dry.   Neurological:      Mental Status: She is alert and oriented to person, place, and time. Mental status is at baseline.   Psychiatric:         Mood and Affect: Mood normal.         Behavior: Behavior normal.                             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    Bilateral cataracts     Breast cancer (CMS/HCC)     Breast lump     Cancer (CMS/HCC)     breast cancer    Carcinoma (CMS/HCC)     Carcinoma of the bladder    Chicken pox     Childhood illness    Conduction Disorder     RBBB    Diabetes mellitus type II     Non-Insulin  dependent    Echocardiogram 10/2013    Gastroesophageal reflux disease     Heart murmur     Hemorrhoids without complication     Holter monitor 02/2014    Hyperlipidemia     Hypertensive disorder     Malignant neoplasm of breast (CMS/HCC)     Measles     Childhood illness  Mumps     Childhood illness    Myocardial perfusion scan 11/2013, 12/2013    MPI single Isotope excrise; MPI dual Isotope Lexiscan     Rash     RBBB     Spinal stenosis     Stage 2 chronic kidney disease 10/29/2023    Syncope    [2]   Past Surgical History:  Procedure Laterality Date    ABLATION OF DYSRHYTHMIC FOCUS      ARTHROSCOPY, KNEE Left 04/21/2015    Procedure: ARTHROSCOPY, KNEE PARTIAL MEDIAL AND LATERAL MENISECTOMY LEFT KNEE;  Surgeon: Evie Shlomo CROME, MD;  Location: [facility];  Service: Orthopedics;  Laterality: Left;  partial medial menisectomy    BREAST BIOPSY      COLONOSCOPY, SCREENING N/A 12/07/2022    Procedure: COLONOSCOPY, SCREENING;  Surgeon: Vernia Mabel HERO, MD;  Location: [facility];  Service: Gastroenterology;  Laterality: N/A;    EGD N/A 12/07/2022     Procedure: EGD;  Surgeon: Vernia Mabel HERO, MD;  Location: [facility];  Service: Gastroenterology;  Laterality: N/A;    HAND SURGERY      HYSTERECTOMY      total    KNEE SURGERY      PERICARDIOCENTESIS N/A 08/12/2024    Procedure: Pericardiocentesis;  Surgeon: Jafir, Ahsan Q, DO;  Location: [facility];  Service: Cardiovascular;  Laterality: N/A;    TUMOR REMOVAL Left     neck  benign    [3]   Family History  Problem Relation Name Age of Onset    Diabetes Mother Kili Gracy     Diabetes Sister Lincey Clyburn     Diabetes Sister Reena Oris     Malignant hyperthermia Neg Hx      Pseudochol deficiency Neg Hx      Anesthesia problems Neg Hx     [4]   Social History  Tobacco Use    Smoking status: Never     Passive exposure: Never    Smokeless tobacco: Never   Vaping Use    Vaping status: Never Used   Substance Use Topics    Alcohol use: No    Drug use: Never   [5]   Allergies  Allergen Reactions    Aspirin      Break out in sweat and dizziness     Other Environmental     Peppers     Pollen Extract     Latex Itching and Rash

## 2024-08-14 NOTE — PT Progress Note (Signed)
 .Physical Therapy Treatment  Helen [name]  Post Acute Care Therapy Recommendations   Discharge Recommendations:  Home with supervision, Home with home health PT, Home with home health OT      DME needs IF patient is discharging home: Front wheel walker (junior RW)    Therapy discharge recommendations may change with patient status.  Please refer to most recent note for up-to-date recommendations.    Unit: MED SURG ICU 1  Bed: A2920/A2920-01  ___________________________________________________    Time of treatment:  PT Received On: 08/14/24  Start Time: 1324  Stop Time: 1347  Time Calculation (min): 23 min         PT Visit Number: 2  ___________________________________________________    Precautions:   Precautions  Weight Bearing Status: no restrictions  Aspiration Precautions: see SLP recommendations  Fall Risks: Low, Impaired balance/gait, Impaired mobility, Muscle weakness    Personal Protective Equipment (PPE)  gloves and procedure mask    Updated X-Rays/Tests/Labs:  Lab Results   Component Value Date/Time    HGB 10.3 (L) 08/14/2024 03:38 AM    HGB 12.6 02/10/2024 12:00 AM    HGB 11.2 (L) 07/30/2012 09:52 AM    HCT 32.2 (L) 08/14/2024 03:38 AM    HCT 42.0 02/10/2024 12:00 AM    K 4.8 08/14/2024 03:38 AM    K 4.3 02/10/2024 12:00 AM    NA 139 08/14/2024 03:38 AM    NA 139 02/10/2024 12:00 AM    INR 1.3 (H) 08/12/2024 02:39 AM    TROPI 5.8 08/12/2024 02:39 AM    TROPI 3.9 08/11/2024 07:45 PM    TROPI <0.01 10/12/2013 06:23 AM    TROPI <0.01 10/11/2013 01:17 AM    TROPI <0.01 10/10/2013 05:39 PM    TROPI <0.01 10/10/2013 11:01 AM       All imaging reviewed, please see chart for details.      Subjective:  Pt received seated in recliner chair. Pt reports to feeling well. Pt denies pain.         Pain Assessment  Pain Assessment: No/denies pain           Patient's medical condition is appropriate for Physical Therapy intervention at this time.  Patient is agreeable to participation in the therapy session. Nursing  clears patient for therapy.    Objective:  Observation of Patient/Vital Signs:  Helen [name]  Vitals:    08/14/24 1400   BP: 113/67   Pulse: 85   Resp: (!) 24   Temp:    SpO2: 98%        Cognition/Neuro Status  Arousal/Alertness: Appropriate responses to stimuli  Attention Span: Appears intact  Orientation Level: Oriented X4  Memory: Appears intact  Following Commands: Follows all commands and directions without difficulty  Safety Awareness: independent  Insights: Educated in engineer, building services  Problem Solving: Able to problem solve independently  Behavior: cooperative;calm  Motor Planning: intact  Coordination: intact    Musculoskeletal Examination  Gross ROM  Right Lower Extremity ROM: within functional limits  Left Lower Extremity ROM: within functional limits        Gross Strength  Right Lower Extremity Strength: 3+/5  Left Lower Extremity Strength: 3+/5            Functional Mobility  Sit to Stand: Supervision  Stand to Sit: Supervision  Transfers  Bed to Chair: Supervision  Device Used for Functional Transfer: front-wheeled walker  Locomotion  Ambulation: Supervision  Pattern: decreased cadence;decreased step length;Step through  Stair Management:  (  simulated by marching in place with UE support on RW)  Distance Walked (ft) (Step 6,7): 120 Feet          Neuro Re-Ed  Sitting Balance: supervision  Standing Balance: supervision;with support              Educated the Patient to role of physical therapy, plan of care, goals of therapy and safety with mobility and ADLs, discharge instructions, home safety, equipment with verbalized understanding .    Patient left in bedside chair. Pt left with all appropriate medical equipment in place, call bell and pt personal items/needs within reach, and with alarm in place. RN notified of session outcome.        Assessment:  Pt received in chair. Vitals monitored and WNL. Discussed [facility] rec and plan of care. Pt reports she will have a lot of support at home. Pt completed gait training with  RW,slow and steady gait without reports of chest palpitations. Pt returned back to room, left seated in chair, vitals WNL post. Reinforced therapists rec to use RW upon D/C home for energy conservation. Pt agrees. Pt left reclined in recliner, SCDs on, all lines connected as found.         PMP Activity: Step 7 - Walks out of Room  Distance Walked (ft) (Step 6,7): 120 Feet    Plan:  Treatment/Interventions: Exercise, Stair training, Gait training, Neuromuscular re-education, Functional transfer training, LE strengthening/ROM, Endurance training, Patient/family training, Equipment eval/education, Bed mobility      PT Frequency: therapy discontinued   Discharge from PT Acute Care Services.    Goals:  Goals  Goal Formulation: With patient  Time for Goal Acheivement: By time of discharge  Pt Will Go Supine To Sit: with supervision, to maximize functional mobility and independence  Pt Will Perform Sit to Stand: with supervision, to maximize functional mobility and independence  Pt Will Transfer Bed/Chair: with rolling walker, with supervision, to maximize functional mobility and independence  Pt Will Ambulate: 51-100 feet, with rolling walker, with supervision, to maximize functional mobility and independence  Pt Will Go Up / Down Stairs: 1 flight, with supervision, With rail, to maximize functional mobility and independence      Helen [name], PT,DPT  [id number]  08/14/2024 2:51 PM  Hours:W and F 8-430 PM; Th 9-530 PM      [facility]  Patient: Helen [name] MRN#: [id number]  Unit: MED SURG ICU 1 Bed: A2920/A2920-01

## 2024-08-14 NOTE — Progress Notes (Signed)
 Pt transferred to U21 from ICU unit 29, on tele, denies chest pain and SOB. Standy assist on RA. Belongings at bedside. Skin assessment completed with additional RN. Unable to administer IV antibiotics due to IV leaking. MD notified. Possible discharge tomorrow per clearance by cardiology.

## 2024-08-14 NOTE — Progress Notes (Signed)
 4 eyes in 4 hours pressure injury assessment note:      Completed with: Dorthea, RN  Unit & Time admitted:  U21 & 1700            Bony Prominences: Check appropriate box; if Pressure Injury is present enter Pressure Injury assessment in LDA    Occiput:                    []  Pressure Injury present  Face:                        []  Pressure Injury present  Ears:                         []  Pressure Injury present  Spine:                       []  Pressure Injury present  Shoulders:                []  Pressure Injury present  Elbows:                     []  Pressure Injury present  Sacrum/coccyx:        []  Pressure Injury present  Ischial Tuberosity:    []  Pressure Injury present  Trochanter/Hip:        []  Pressure Injury present  Knees:                      []  Pressure Injury present  Ankles:                     []  Pressure Injury present  Heels:                       []  Pressure Injury present  Other pressure areas: []  Pressure Injury location       Device related: []  Device name:         LDA completed if pressure injury present: yes/no  Consult WOCN if necessary    Other skin related issues, ie tears, rash, etc, document in Integumentary flowsheet

## 2024-08-14 NOTE — Progress Notes (Signed)
 Writer at the bedside for c/o leaking IV. Pt with Rt arm restrictions. LUE swollen from around the A/C up and slightly warmer to touch. Current IV in the Left Forearm intact but fluid leaks above IV site (Old IV site). Pt refuse to receive another IV at the dorsal part of the hand. Bedside RNs (Outgoing & incoming) at bedside and are aware.

## 2024-08-14 NOTE — Progress Notes (Addendum)
 HEMATOLOGY ONCOLOGY PROGRESS NOTE  [place]  [facility]  [phone]    Patient Name: Helen [name] Day 3     Subjective:  No new issues.  May go home tomorrow.      Objective:   Physical Exam:  Vitals:    08/14/24 1400   BP: 113/67   Pulse: 85   Resp: (!) 24   Temp:    SpO2: 98%     Intake and Output Summary (Last 24 hours) at Date Time    Intake/Output Summary (Last 24 hours) at 08/14/2024 1459  Last data filed at 08/14/2024 1300  Gross per 24 hour   Intake 948.33 ml   Output 1 ml   Net 947.33 ml       Gen:: in no acute distress  HEENT: Normocephalic atraumatic  Lungs: Unlabored breathing   Neuro: alert, appropriate  Lines/Access:    Medications:   Scheduled Meds:Current Facility-Administered Medications[1]  Continuous Infusions:  PRN Meds:.acetaminophen , benzocaine -menthol , benzonatate , carboxymethylcellulose sodium, dextrose  **OR** dextrose  **OR** dextrose  **OR** glucagon  (rDNA), magnesium  sulfate, melatonin, naloxone , ondansetron  **OR** ondansetron , polyethylene glycol, potassium & sodium phosphates , potassium chloride  **OR** potassium chloride  **OR** potassium chloride , saline    Labs:     Results for orders placed or performed during the hospital encounter of 08/11/24 (from the past 24 hours)    Collection Time: 08/13/24  3:56 PM   Result Value    Whole Blood Glucose POCT 196 (H)    Collection Time: 08/13/24 11:12 PM   Result Value    Whole Blood Glucose POCT 180 (H)   Basic Metabolic Panel    Collection Time: 08/14/24  3:38 AM   Result Value    Glucose 145 (H)    BUN 19    Creatinine 0.8    Calcium  8.1    Sodium 139    Potassium 4.8    Chloride 110    CO2 18    Anion Gap 11.0    GFR >60.0    Collection Time: 08/14/24  3:38 AM   Result Value    Magnesium  2.4   CBC with Differential (Component)    Collection Time: 08/14/24  3:38 AM   Result Value    WBC 11.33 (H)    Hemoglobin 10.3 (L)    Hematocrit 32.2 (L)    Platelet Count 339    MPV 9.6    RBC 4.60    MCV 70.0 (L)    MCH 22.4 (L)     MCHC 32.0    RDW 14    nRBC % 0.0    Absolute nRBC 0.00    Preliminary Absolute Neutrophil Count 8.22 (H)    Neutrophils % 72.5    Lymphocytes % 13.2    Monocytes % 13.1    Eosinophils % 0.3    Basophils % 0.4    Immature Granulocytes % 0.5    Absolute Neutrophils 8.22 (H)    Absolute Lymphocytes 1.50    Absolute Monocytes 1.48 (H)    Absolute Eosinophils 0.03    Absolute Basophils 0.04    Absolute Immature Granulocytes 0.06    Collection Time: 08/14/24  4:19 AM   Result Value    Whole Blood Glucose POCT 149 (H)    Collection Time: 08/14/24  7:53 AM   Result Value    Whole Blood Glucose POCT 170 (H)    Collection Time: 08/14/24 11:26 AM   Result Value    Whole Blood Glucose POCT 239 (H)  Assessment/Plan:     Helen [name] is a 78 y.o. female with PMHx of breast cancer-s/p surgery-xrt-SERM 28 years ago, DM 2, HTN, HLD, bladder carcinoma, RBBB, CKD stage II, spinal stenosis, multiple viral illnesses in childhood (measles, mumps, chickenpox), and recent anemia, leukocytosis and thrombocytosis.   Admitted with pericardial effusion.     1- Pericardial effusion. So far this is likely idiopathic.  Cytology came back as benign and did not show any malignant cells. Of note CT showed no obvious evidence of metastatic disease.       2- Anemia.  Iron levels within normal range.  Suspect of chronic disease.  Will need outpatient follow-up with Dr. Windsor. Await erythropoietin level     3- Leukocytosis and thrombocytosis: Likely reactive.  Await flow cytometry results.  Can follow-up as an outpatient    4- History of breast and bladder cancer.  Except for pericardial effusion, no obvious evidence to suggest possible recurrence.                Ezra Denne, MD, MD  [place]  [facility]  P: [phone]  F: [phone]            [1]   Current Facility-Administered Medications   Medication Dose Route Frequency    atorvastatin   40 mg Oral QHS    colchicine   0.6 mg Oral Daily    insulin  lispro  1-5 Units  Subcutaneous Q4H SCH    metoprolol  tartrate  12.5 mg Oral Q12H [facility]    pantoprazole   40 mg Oral QAM AC    piperacillin -tazobactam  4.5 g Intravenous Q6H    senna-docusate  1 tablet Oral Q12H [facility]

## 2024-08-14 NOTE — Progress Notes (Signed)
 Infectious Diseases & Tropical Medicine  Progress Note    08/14/2024   Helen [name] RDW:[id number],FMW:[id number] is a 78 y.o. female,       Assessment:     Large pericardial effusion/tamponade-likely noninfectious/idiopathic  Post pericardiocentesis (08/12/2024)  Pericardial fluid no growth to date  Oncology following-no evidence of malignant cells  Chronic kidney disease  Diabetes mellitus  Hyperlipidemia  History of breast and bladder cancer  No fever or leukocytosis  Clinically stable-moved out of ICU    Plan:     Monitor without antibiotics  Monitor temperature curve and leukocyte counts  Continue probiotics  Continue colchicine   Monitor electrolytes and renal functions closely  Monitor clinically  Discussed with patient in detail         ROS:     General:  no fever, no chills, no rigor, awake and alert, feeling better  HEENT: no neck pain, no throat pain  Endocrine:  no fatigue, no night sweats  Respiratory: no cough, shortness of breath, or wheezing   Cardiovascular: no chest pain   Gastrointestinal: no abdominal pain,no N/V/D  Genito-Urinary: no hematuria  Musculoskeletal: no edema  Neurological: Awake and alert  Dermatological: no rash, no ulcer    Physical Examination:     Blood pressure 122/59, pulse 93, temperature 97.5 F (36.4 C), temperature source Axillary, resp. rate (!) 25, height 1.549 m (5' 1), weight 71.8 kg (158 lb 4.6 oz), SpO2 96%.    General Appearance: Appears very comfortable  HEENT: Pupils are equal, round, and reactive to light.   Lungs:  Clear to auscultation  Heart:  Regular rate and rhythm  Chest: Symmetric chest wall expansion.   Abdomen: soft ,non tender,no hepatosplenomegaly,good bowel sounds  Neurological: Awake  Extremities: No edema    Laboratory And Diagnostic Studies:     Recent Labs     08/14/24  0338 08/13/24  0341   WBC 11.33* 10.98*   Hemoglobin 10.3* 9.4*   Hematocrit 32.2* 29.6*   Platelet Count 339 350*     Recent Labs     08/14/24  0338 08/13/24  0341   Sodium 139  139   Potassium 4.8 3.8   Chloride 110 109   CO2 18 15*   BUN 19 24*   Creatinine 0.8 1.1*   Glucose 145* 149*   Calcium  8.1 7.8*     Recent Labs     08/11/24  1934   AST (SGOT) 31   ALT 15   Alkaline Phosphatase 85   Protein, Total 8.0   Albumin 3.3*       Current Meds:      Scheduled Meds: PRN Meds:    colchicine , 0.6 mg, Oral, Daily  insulin  lispro, 1-5 Units, Subcutaneous, Q4H SCH  metoprolol  tartrate, 12.5 mg, Oral, Q12H SCH  pantoprazole , 40 mg, Oral, QAM AC  piperacillin -tazobactam, 4.5 g, Intravenous, Q8H  senna-docusate, 1 tablet, Oral, Q12H SCH        Continuous Infusions:   acetaminophen , 500 mg, Q4H PRN  benzocaine -menthol , 1 lozenge, Q2H PRN  benzonatate , 100 mg, TID PRN  carboxymethylcellulose sodium, 1 drop, TID PRN  dextrose , 15 g of glucose, PRN   Or  dextrose , 12.5 g, PRN   Or  dextrose , 12.5 g, PRN   Or  glucagon  (rDNA), 1 mg, PRN  magnesium  sulfate, 1 g, PRN  melatonin, 3 mg, QHS PRN  naloxone , 0.2 mg, PRN  ondansetron , 4 mg, Q6H PRN   Or  ondansetron , 4 mg, Q6H PRN  polyethylene glycol, 17  g, Daily PRN  potassium & sodium phosphates , 2 packet, PRN  potassium chloride , 0-60 mEq, PRN   Or  potassium chloride , 0-60 mEq, PRN   Or  potassium chloride , 10 mEq, PRN  saline, 2 spray, Q4H PRN          Simran Bomkamp A. Louella, M.D.  08/14/2024  9:18 AM

## 2024-08-14 NOTE — Progress Notes (Signed)
 Report given to [facility] RN,pt will be going to room 2105-A.

## 2024-08-14 NOTE — Progress Notes (Addendum)
 [place]  HEART CARDIOLOGY PROGRESS NOTE    [name]  MED SURG ICU 1  [address] [name] [address]  [place] [address]  Dept: [phone]  Loc: [phone]    Date Time: 08/14/2024 8:38 AM  Patient Name: Helen [name] Day: 3    Subjective/Cardiac Relevant Events:   Drain pulled yesterday 12/11. She remains off pressors   Feels great this morning.  Pericardial fluid cytology is pending.  Tele: No arrhythmias    ASSESSMENT:   Patient is a 78 y.o. female with the following relevant diagnoses:    Hemorrhagic pericardial effusion s/p pericardiocentesis 12/10. CT chest abdomen and pelvis unrevealing for possible active malignancy.  Patient remained on pressors for some time following drain placement, which further argues against tamponade as I would expect marked improvement in hemodynamics following evacuation of the fluid.  History of breast CA 28 years ago.  Type 2 diabetes      RECOMMENDATIONS:     Plan for repeat echocardiogram this morning after drain pulled yesterday. If no significant effusion on this echocardiogram, recommend repeat in one week.  Differential for hemorrhagic pericardial effusion includes TB, malignancy.   Continue ID and Oncology consults. Remains on antibiotics.  Continue colchicine  0.6mg  daily, metoprolol  tartrate 12.5 mg BID  Would avoid blood thinners as able.    ADDENDUM  Echocardiogram without re-accumulation of pericardial effusion. [place] [facility] will sign off, OP follow up ordered.  OP echocardiogram ordered for 1 week, messaged my office to arrange.    Telemetry/Labs Reviewed/Intake-Output:     Tele: Sinus with PACs/runs of SVT.      Recent Labs   Lab 08/12/24  0239 08/11/24  1945   hs Troponin 5.8 3.9             Recent Labs   Lab 08/11/24  1934   Bilirubin, Total 0.9   Protein, Total 8.0   Albumin 3.3*   ALT 15   AST (SGOT) 31     Recent Labs   Lab 08/14/24  0338   Magnesium  2.4     Recent Labs   Lab 08/12/24  0239   PT 14.1*   INR 1.3*     Recent Labs   Lab  08/14/24  0338 08/13/24  0341 08/12/24  0239   WBC 11.33* 10.98* 10.43*   Hemoglobin 10.3* 9.4* 10.1*   Hematocrit 32.2* 29.6* 31.4*   Platelet Count 339 350* 352*     Recent Labs   Lab 08/14/24  0338 08/13/24  0341 08/12/24  0239   Sodium 139 139 139   Potassium 4.8 3.8 4.0   Chloride 110 109 102   CO2 18 15* 21   BUN 19 24* 12   Creatinine 0.8 1.1* 0.8   GFR >60.0 50.8* >60.0   Glucose 145* 149* 92   Calcium  8.1 7.8* 8.7           Invalid input(s): FREET4   No results found for: NTPROBNP      Intake/Output Summary (Last 24 hours) at 08/14/2024 0838  Last data filed at 08/14/2024 0600  Gross per 24 hour   Intake 1553.33 ml   Output 401 ml   Net 1152.33 ml       Medications:      Scheduled Meds: PRN Meds:    colchicine , 0.6 mg, Oral, Daily  insulin  lispro, 1-5 Units, Subcutaneous, Q4H SCH  metoprolol  tartrate, 12.5 mg, Oral, Q12H SCH  pantoprazole , 40 mg, Oral, QAM AC  piperacillin -tazobactam, 4.5 g, Intravenous, Q8H  senna-docusate, 1 tablet, Oral, Q12H SCH        Continuous Infusions:   acetaminophen , 500 mg, Q4H PRN  benzocaine -menthol , 1 lozenge, Q2H PRN  benzonatate , 100 mg, TID PRN  carboxymethylcellulose sodium, 1 drop, TID PRN  dextrose , 15 g of glucose, PRN   Or  dextrose , 12.5 g, PRN   Or  dextrose , 12.5 g, PRN   Or  glucagon  (rDNA), 1 mg, PRN  magnesium  sulfate, 1 g, PRN  melatonin, 3 mg, QHS PRN  naloxone , 0.2 mg, PRN  ondansetron , 4 mg, Q6H PRN   Or  ondansetron , 4 mg, Q6H PRN  polyethylene glycol, 17 g, Daily PRN  potassium & sodium phosphates , 2 packet, PRN  potassium chloride , 0-60 mEq, PRN   Or  potassium chloride , 0-60 mEq, PRN   Or  potassium chloride , 10 mEq, PRN  saline, 2 spray, Q4H PRN          Physical Exam:     Vitals:    08/14/24 0400 08/14/24 0600 08/14/24 0700 08/14/24 0800   BP: 100/56 140/65     Pulse: 84 86 94    Resp: 20 19 (!) 25    Temp: 98.1 F (36.7 C)   97.5 F (36.4 C)   TempSrc: Oral   Axillary   SpO2: 93% 93% 96%    Weight: 71.8 kg (158 lb 4.6 oz)      Height:              05/21/2024 07/20/2024 07/25/2024 08/11/2024 08/12/2024 08/13/2024 08/14/2024   Weight Monitoring   Height 154.9 cm 154.9 cm 154.9 cm 154.9 cm 154.9 cm    154.9 cm     Height Method   Stated  Stated     Weight 71.668 kg 69.854 kg 70.761 kg 68.9 kg    69.854 kg 65.7 kg    68.9 kg 72.2 kg 71.8 kg   Weight Method    Bed Scale Bed Scale Bed Scale Bed Scale   BMI (calculated) 29.9 kg/m2 29.1 kg/m2 29.5 kg/m2 28.7 kg/m2    29.1 kg/m2 27.4 kg/m2    28.7 kg/m2 30.1 kg/m2 29.9 kg/m2       Multiple values from one day are sorted in reverse-chronological order       Constitutional: Cooperative, alert, no acute distress. Sitting up in chair.  Neck: JVP normal.  Cardiac: Regular rate and rhythm, normal S1 and S2; no S3 or S4, no murmurs, no rubs, no gallops.  Pulmonary: Clear to auscultation bilaterally, no wheezing, no rhonchi, no rales.  Extremities: no edema.    -------------------------------------------------------------------------------------  Signed by:         Ian DELENA Jernigan, MD         [place]  [facility]    [place]  [facility] Contact Information   [facility]  Secure Chat (Group):   [facility] [place] [facility]    APP Spectralink:  [phone]    MD Spectralink :  [phone]  [phone]    After hours, non urgent consult line:  [phone]    After hours, physician on-call:  [phone] [facility]  Secure Chat (Group):   [facility] [place] [facility]    APP Spectralink:  [phone]    MD Spectralink :  [phone]      After hours, non urgent consult line:  [phone]    After hours, physician on-call:  [phone] [name]  Secure Chat (Group):   [facility] [place] [facility]    APP Spectralink:  [phone]    MD Spectralink :  [phone]  After hours, non urgent consult line:  [phone]    After hours, physician on-call:  [phone] [name]  Secure Chat (Group):   [place] [facility]    APP Spectralink:  [phone]    MD Spectralink :  [phone]      After hours, non urgent consult line:  [phone]    After hours,  physician on-call:  [phone]       Please pardon any potential grammatical or typographical errors as aspects of this note may have been created through speech-to-text software.

## 2024-08-14 NOTE — Provider Clarification Note (Signed)
 Patient Name: Helen [name], Helen [name]  Account #: [id number]   MR #: [id number]  Discharge Date: @DCDATE @          Thank you for responding!    Documentation Query sent by: Ozell CHRISTELLA Edinger  Date:  08/14/2024        PROVIDER RESPONSE Insert a query response from the list above or add free text: Unable to determine

## 2024-08-14 NOTE — Procedures (Signed)
 Midline consult received. VARD at bedside but pt refused MDL. Pt stated that she is being discharge in the AM and does not need MDL. Existing PIV leaking. New US  guided PIV placed. Midline cancelled. Bedside Hennie) RN aware.

## 2024-08-14 NOTE — Telephone Encounter (Signed)
 Lvm for pt to contact office to schedule 6 month fu with Dr. Zebedee.

## 2024-08-14 NOTE — Progress Notes (Signed)
 [facility]  Progress Note    Patient Name: Helen [name]  MRN: [id number]  Room: A2920/A2920-01    Reason for Admission: Pericardial effusion  ICU Day: 1d 23h    Assessment & Plan   [facility] Attending Assessment/Plan:  See below.      Overview   Helen [name] is a 78 y.o. female with PMHx of DM 2, HTN, HLD, breast cancer, bladder carcinoma, RBBB, CKD stage II, spinal stenosis, multiple viral illnesses in childhood (measles, mumps, chickenpox), who presented to ED on 08/11/2024 with progressive palpitations, nausea and decreased appetite.  In ED she was noted to be febrile with temperature 101 F, and sinus tachycardia with heart rate 110s to 120s, normal respiratory rate and oxygenating adequately on room air, GCS 15.  Chest x-ray revealed marked cardiomegaly concerning for pericardial effusion.  BMP and LFTs were benign.  Anion gap was 16 and lactate was normal at 1.4.  CBC notable for mild leukocytosis and anemia with WBC 10.4 K, hemoglobin 10.1, platelets mildly elevated.  COVID and flu swabs are negative.  Urinalysis showed glucosuria and mild ketonuria with no evidence of infection.  CTA of the chest abdomen pelvis revealed a large pericardial effusion with findings suggestive of pericarditis, trace left pleural fluid, postsurgical changes of the right breast, incidental finding of cholelithiasis and colonic diverticulosis, no evidence of aortic dissection.  Cardiology was consulted and a stat echocardiogram was ordered.  [facility] was consulted and the patient was accepted for admission to the ICU.  Patient seen and evaluated on unit 28 with echocardiogram underway, confirming the presence of a moderate pericardial effusion, formal read pending.  Patient noted to be tachycardic with heart rate 120s to 130s, hypotensive with SBP in the 70s, improved to 80s after 1 L NS bolus.  1 L of LR bolus was ordered and the patient was started on broad-spectrum antimicrobial coverage by [facility].  She denies  chest pain, dyspnea, dizziness, or lightheadedness.  She endorses nausea and had an episode of vomiting earlier for which she received Compazine  x 2.   (Copied from H&P)    Subjective   Denies complaints. Pericardial drain removed 12/11. No pericardial fluid on repeat limited TTE today.     Hospital Course:  12/10: Admitted with ICU, started on Levophed , underwent urgent pericardiocentesis with pericardial drain placement and and removal of 470 cc of bloody pericardial fluid.  Transferred to ICU, continued on Levophed , PICC line placed.  Empiric antibiotics started.  ID and heme-onc consulted.  12/11: Weaned off Levophed .  Intermittent SVT, started low-dose PO metoprolol  BID with improvement.  Limited echo with no recurrence of effusion.  Pericardial drain removed in the later afternoon.  12/12:  No acute events overnight     Objective   Physical Examination     Last vent settings if on vent:     Vitals Temp:  [97.7 F (36.5 C)-98.9 F (37.2 C)]   Heart Rate:  [82-146]   Resp Rate:  [18-31]   BP: (78-169)/(41-97)   SpO2:  [91 %-98 %]   Weight:  [71.8 kg (158 lb 4.6 oz)]   BMI (calculated):  [29.9]  // Temperature with 24 range    Input / Output:   Intake/Output Summary (Last 24 hours) at 08/14/2024 0721  Last data filed at 08/14/2024 0600  Gross per 24 hour   Intake 1793.33 ml   Output 401 ml   Net 1392.33 ml     General: Elderly woman in no acute distress,  pleasant    Neuro:  Awake and alert, grossly nonfocal exam    Lungs: clear to auscultation, no wheezes, rales or rhonchi    Cardiac: normal rate, regular rhythm, normal S1, S2, no murmurs    Abdomen: soft, nontender, nondistended, no masses or organomegaly, normal BS+    Extremities: peripheral pulses normal no pedal edema no clubbing or cyanosis no ulcers, gangrene or atrophic changes    Skin:   Warm, dry and intact    [facility] Checklist  Sedation:   CAM-ICU: Positive or Negative for Delirium: Negative   CAM ICU:   Last Documented RASS: RASS Score: Alert and Calm    Currently ordered infusions:    Reviewed: Yes   Mobility:   Current Mobility Level: PMP Activity: Step 5 - Chair (08/14/2024  6:00 AM)    Current PT Order: Yes  Current OT Order: No Reviewed: Yes   Respiratory (n/a if blank):   Ventilator Time:    Last Recorded Vent Mode:    Reviewed: Yes   Gastrointenstinal  Last Bowel Movement:   Last BM Date: 08/14/24 Reviewed: Yes   CAUTI Prevention (n/a if blank):  Foley Day:        Reviewed: Yes   Blood Stream Infection Prevention (n/a if blank):   PICC Double Lumen 08/12/24 Left Basilic Reviewed: Yes   DVT Chemoprophylaxis (none if blank):    Reviewed: Yes     Assessment   Helen [name] is a 78 y.o. female who presents with pericardial effusion with concern for tamponade.      Active Problems:   # See below.      Patient has a BMI of 29.91 kg/m2    Class 1 Obesity: BMI of 30 to 34.9      Recent Labs     08/14/24  0338 08/13/24  0341 08/12/24  0239 08/11/24  1934   Sodium 139 139 139 134*     Diagnosis: Mild Hyponatremia     Recent Labs   Lab 08/14/24  0338 08/13/24  0341 08/12/24  0239   Hemoglobin 10.3* 9.4* 10.1*   Hematocrit 32.2* 29.6* 31.4*   MCV 70.0* 70.0* 70.2*   WBC 11.33* 10.98* 10.43*   Platelet Count 339 350* 352*     Recent Labs   Lab 05/12/24  0000   Ferritin 358*   Iron 60   TIBC 288   Iron Saturation 21     Anemia Diagnosis: Chronic: Anemia of Chronic Disease  Cr Baseline Estimation (minimum in last 3 months): 0.8 mg/dL  Maximum Cr in last 36 hours: 1.1 mg/dL    Recent Labs (Last 3 Months)     08/14/24  0338 08/13/24  0341 08/12/24  0239   Creatinine 0.8 1.1* 0.8   BUN 19 24* 12     Acute Kidney Injury Diagnosis: Acute kidney injury, present on admission       Laboratory Evidence of Acidosis:  Recent Labs   Lab 08/14/24  0338 08/13/24  0341 08/12/24  0239   CO2 18 15* 21      Acidosis Diagnosis: Metabolic Acidosis                Plan   NEUROLOGICAL:  - CPOT Goal <3; Tylenol  as needed for pain  - CAM ICU monitoring per protocol  - Delirium precautions  including maintenance of day/night wake cycles, frequent reorientation as needed           - Physical therapy/Occupational Therapy/Out of bed to  chair, as indicated/tolerated     CARDIOVASCULAR:   #Pericardial effusion status post pericardiocentesis and drain placement (pericardial drain removed 12/11)  #History of HTN and HLD  #Chronic RBBB  #Undifferentiated shock, resolved  #SVT  - Goal MAP >65  - Tachyarrhythmia likely provoked by pericardial drain, improved status post removal and initiation of low-dose oral metoprolol   - Hemodynamically stable off vasopressor support  - No recurrence of effusion on limited echo today.  - Cardiology following, appreciate recs  - Differential includes malignant versus infectious pericardial effusion  - Continue broad-spectrum antibiotics as detailed below  - Resume statin     PULMONARY:   - No acute issues  - Oxygenating well on room air  - Encourage incentive spirometer use 10 times per hour  - Strict aspiration precautions     RENAL:   #AKI on CKD stage II- improved  # Non-anion gap metabolic acidosis  - Replace electrolytes as needed per protocol, Daily BMP  - Monitor urine output     GASTROINTESTINAL:  #History of GERD  - At risk for constipation  - Nutrition: Regular diet  - Bowel Regimen as needed  - GI ppx: PPI per home regimen     INFECTIOUS DISEASE:   #Concern for sepsis  - Febrile, hypotensive and tachycardic.  - Chest x-ray and urinalysis do not suggest pneumonia or UTI.  - Concern for possible infectious pericardial effusion.  - Continue empiric Zosyn   - MRSA swab negtive  - ID input appreciated.  Plan to de-escalate antibiotics pending culture data and clinical course     HEMATOLOGY/ONCOLOGY:   #Anemia of chronic disease  #History of breast cancer  #Rule out malignant pericardial effusion  #Anemia of chronic disease  #Possible history of bladder cancer per EMR (patient denies)  - Appreciate heme-onc input  - Pericardial cytology negative  - Tumor markers ordered by  oncology team  - CT imaging with no evidence of cancer recurrence  - VTE ppx: SCDs only given hemorrhagic pericardial effusion     ENDOCRINOLOGY/RHEUMATOLOGY:   # History of type 2 diabetes  - Hold home oral medications  - Insulin  sliding scale low-dose ACHS  - Glucose goal 100-180    FAMILY UPDATE:   Patient updated on plan of care, all questions answered & she was appreciative.     Advance Care Planning: Open ACP Navigator  Code Status: Full Code  Next of Kin:  Primary Emergency Contact: Gunnison,Tahsha         ADVANCE CARE PLAN         This patient is critically ill with life-threatening condition(s) and a high probability of sudden clinically significant deterioration due to the condition(s) noted in the assessment and plan, which requires the highest level of physician/advance practice provider preparedness to intervene urgently. Full attention to the direct care of this patient was provided for the period of time noted below. Any critical care time performed today is exclusive of teaching and billable procedures and not overlapping with any other physicians or advance practice providers.    I have personally assessed the patient and based my assessment and medical decision-making on a review of the patient's history and 24-hour interval events along with medical records, physical examination, vital signs, analysis of recent laboratory results, evaluation of radiology images, monitoring data for potential decompensation, and additional findings found in detail within ICU team notes. The findings and plan of care was discussed with the care team.    Total critical care time: 40 minutes  during this encounter.    Nickie DELENA Free, MD   08/14/2024 7:21 AM

## 2024-08-14 NOTE — Plan of Care (Signed)
 -Neuro: A&Ox4, PERRLA, no c/o pain   -Respiratory: RA, sating >97%, LS clear  -Cardiac: NSR RBBB, HR 80's-90's, SBP 110's-150's   -GI: regular diet, Q4 BG checks- coverage needed, 3 BM- BS hyperactive- bowel regimen continued   -GU:  UO ~ 3x occurences   -Skin: intact, wound care rendered as ordered      Plan of care:  -Monitor BP and electrolytes  -IV ABX      See flowsheets and MARs for interventions and assessments.       Problem: Pain interferes with ability to perform ADL  Goal: Pain at adequate level as identified by patient  Outcome: Progressing     Problem: Side Effects from Pain Analgesia  Goal: Patient will experience minimal side effects of analgesic therapy  Outcome: Progressing  Flowsheets (Taken 08/14/2024 1340)  Patient will experience minimal side effects of analgesic therapy:   Monitor/assess patient's respiratory status (RR depth, effort, breath sounds)   Prevent/manage side effects per LIP orders (i.e. nausea, vomiting, pruritus, constipation, urinary retention, etc.)   Assess for changes in cognitive function   Evaluate for opioid-induced sedation with appropriate assessment tool (i.e. POSS)     Problem: Compromised Activity/Mobility  Goal: Activity/Mobility Interventions  Outcome: Progressing  Flowsheets (Taken 08/14/2024 1340)  Activity/Mobility Interventions: Pad bony prominences, TAP Seated positioning system when OOB, Promote PMP, Reposition q 2 hrs / turn clock, Offload heels     Problem: Altered GI Function  Goal: Fluid and electrolyte balance are achieved/maintained  Outcome: Progressing  Flowsheets (Taken 08/14/2024 1340)  Fluid and electrolyte balance are achieved/maintained:   Monitor/assess lab values and report abnormal values   Assess and reassess fluid and electrolyte status   Observe for cardiac arrhythmias   Monitor for muscle weakness     Problem: Hemodynamic Status: Cardiac  Goal: Stable vital signs and fluid balance  Outcome: Progressing  Flowsheets (Taken 08/14/2024  1340)  Stable vital signs and fluid balance:   Assess signs and symptoms associated with cardiac rhythm changes   Monitor lab values     Problem: Inadequate Tissue Perfusion  Goal: Adequate tissue perfusion will be maintained  Outcome: Progressing  Flowsheets (Taken 08/14/2024 1340)  Adequate tissue perfusion will be maintained:   Monitor/assess lab values and report abnormal values   Monitor/assess neurovascular status (pulses, capillary refill, pain, paresthesia, paralysis, presence of edema)   Monitor/assess for signs of VTE (edema of calf/thigh redness, pain)   Monitor for signs and symptoms of a pulmonary embolism (dyspnea, tachypnea, tachycardia, confusion)   Encourage/assist patient as needed to turn, cough, and perform deep breathing every 2 hours     Problem: Moderate/High Fall Risk Score >5  Goal: Patient will remain free of falls  Outcome: Progressing  Flowsheets (Taken 08/14/2024 1340)  High (Greater than 13):   HIGH-Consider use of low bed   MOD-Perform dangle, stand, walk (DSW) prior to mobilization   HIGH-Visual cue at entrance to patient's room   MOD-Place Fall Risk level on whiteboard in room   HIGH-Apply yellow Fall Risk arm band   HIGH-Bed alarm on at all times while patient in bed   HIGH-Initiate use of floor mats as appropriate   MOD-Use of assistive devices -Bedside Commode if appropriate  VH High Risk (Greater than 13):   Use of floor mat   Use of STOP ask for help sign   Keep door open for better visibility   PATIENT IS TO BE SUPERVISED FOR ALL TOILETING ACTIVITIES   A CHAIR PAD ALARM WILL  BE USED WHEN PATIENT IS UP SITTING IN A CHAIR   BED ALARM WILL BE ACTIVATED WHEN THE PATEINT IS IN BED WITH SIGNAGE RESET BED ALARM   RED HIGH FALL RISK SIGNAGE     Problem: Compromised Sensory Perception  Goal: Sensory Perception Interventions  Outcome: Progressing  Flowsheets (Taken 08/14/2024 1340)  Sensory Perception Interventions: Offload heels, Pad bony prominences, Reposition q 2hrs/turn  Clock, Q2 hour skin assessment under devices if present     Problem: Compromised Moisture  Goal: Moisture level Interventions  Outcome: Progressing  Flowsheets (Taken 08/14/2024 1340)  Moisture level Interventions: Moisture wicking products, Moisture barrier cream     Problem: Compromised Nutrition  Goal: Nutrition Interventions  Outcome: Progressing  Flowsheets (Taken 08/14/2024 1340)  Nutrition Interventions: Discuss nutrition at Rounds, I&Os, Document % meal eaten, Daily weights     Problem: Compromised Friction/Shear  Goal: Friction and Shear Interventions  Outcome: Progressing  Flowsheets (Taken 08/14/2024 1340)  Friction and Shear Interventions: Pad bony prominences, Off load heels, HOB 30 degrees or less unless contraindicated, Consider: TAP seated positioning, Heel foams     Problem: Ineffective Gas Exchange  Goal: Effective breathing pattern  Outcome: Progressing  Flowsheets (Taken 08/14/2024 1340)  Effective breathing pattern:   Maintain CO2 level per LIP order   Teach/reinforce use of ordered respiratory interventions (ie. CPAP, BiPAP, Incentive Spirometer, Acapella)     Problem: Fluid and Electrolyte Imbalance/ Endocrine  Goal: Fluid and electrolyte balance are achieved/maintained  Outcome: Progressing  Flowsheets (Taken 08/14/2024 1340)  Fluid and electrolyte balance are achieved/maintained:   Monitor/assess lab values and report abnormal values   Assess and reassess fluid and electrolyte status   Observe for cardiac arrhythmias   Monitor for muscle weakness  Goal: Adequate hydration  Outcome: Progressing  Flowsheets (Taken 08/14/2024 1340)  Adequate hydration:   Assess mucus membranes, skin color, turgor, perfusion and presence of edema   Assess for peripheral, sacral, periorbital and abdominal edema   Monitor and assess vital signs and perfusion     Problem: Diabetes: Glucose Imbalance  Goal: Blood glucose stable at established goal  Outcome: Progressing  Flowsheets (Taken 08/14/2024 1340)  Blood  glucose stable at established goal:   Monitor lab values   Monitor intake and output.  Notify LIP if urine output is < 30 mL/hour.   Follow fluid restrictions/IV/PO parameters   Assess for hypoglycemia /hyperglycemia   Monitor/assess vital signs   Include patient/family in decisions related to nutrition/dietary selections   Coordinate medication administration with meals, as indicated   Ensure appropriate diet and assess tolerance   Ensure adequate hydration   Ensure appropriate consults are obtained (Nutrition, Diabetes Education, and Case Management)   Ensure patient/family has adequate teaching materials     Problem: Safety  Goal: Patient will be free from injury during hospitalization  Outcome: Progressing  Flowsheets (Taken 08/14/2024 1340)  Patient will be free from injury during hospitalization:   Assess patient's risk for falls and implement fall prevention plan of care per policy   Provide and maintain safe environment   Ensure appropriate safety devices are available at the bedside   Use appropriate transfer methods   Include patient/ family/ care giver in decisions related to safety   Hourly rounding   Assess for patients risk for elopement and implement Elopement Risk Plan per policy  Goal: Patient will be free from infection during hospitalization  Outcome: Progressing       Problem: Psychosocial and Spiritual Needs  Goal: Demonstrates ability to cope  with hospitalization/illness  Outcome: Progressing  Flowsheets (Taken 08/14/2024 1340)  Demonstrates ability to cope with hospitalizations/illness:   Encourage verbalization of feelings/concerns/expectations   Provide quiet environment   Assist patient to identify own strengths and abilities   Encourage patient to set small goals for self   Include patient/ patient care companion in decisions

## 2024-08-15 ENCOUNTER — Inpatient Hospital Stay

## 2024-08-15 LAB — BASIC METABOLIC PANEL
Anion Gap: 7 (ref 5.0–15.0)
BUN: 11 mg/dL (ref 7–21)
CO2: 23 meq/L (ref 17–29)
Calcium: 8 mg/dL (ref 7.9–10.2)
Chloride: 112 meq/L — ABNORMAL HIGH (ref 99–111)
Creatinine: 0.6 mg/dL (ref 0.4–1.0)
GFR: >60 mL/min/1.73 m2 (ref >=60.0)
Glucose: 136 mg/dL — ABNORMAL HIGH (ref 70–100)
Potassium: 4.3 meq/L (ref 3.5–5.3)
Sodium: 142 meq/L (ref 135–145)

## 2024-08-15 LAB — CBC
Absolute nRBC: 0 x10 3/uL (ref <=0.00)
Absolute nRBC: 0 x10 3/uL (ref <=0.00)
Hematocrit: 27 % — ABNORMAL LOW (ref 34.7–43.7)
Hematocrit: 33 % — ABNORMAL LOW (ref 34.7–43.7)
Hemoglobin: 8.5 g/dL — ABNORMAL LOW (ref 11.4–14.8)
Hemoglobin: 10.5 g/dL — ABNORMAL LOW (ref 11.4–14.8)
MCH: 22 pg — ABNORMAL LOW (ref 25.1–33.5)
MCH: 22.2 pg — ABNORMAL LOW (ref 25.1–33.5)
MCHC: 31.5 g/dL (ref 31.5–35.8)
MCHC: 31.8 g/dL (ref 31.5–35.8)
MCV: 69.9 fL — ABNORMAL LOW (ref 78.0–96.0)
MCV: 69.9 fL — ABNORMAL LOW (ref 78.0–96.0)
MPV: 9.5 fL (ref 8.9–12.5)
MPV: 9.1 fL (ref 8.9–12.5)
Platelet Count: 323 x10 3/uL (ref 142–346)
Platelet Count: 388 x10 3/uL — ABNORMAL HIGH (ref 142–346)
RBC: 3.86 x10 6/uL — ABNORMAL LOW (ref 3.90–5.10)
RBC: 4.72 x10 6/uL (ref 3.90–5.10)
RDW: 14 % (ref 11–15)
RDW: 14 % (ref 11–15)
WBC: 8.34 x10 3/uL (ref 3.10–9.50)
WBC: 7.93 x10 3/uL (ref 3.10–9.50)
nRBC %: 0 /100{WBCs} (ref <=0.0)
nRBC %: 0 /100{WBCs} (ref <=0.0)

## 2024-08-15 LAB — WHOLE BLOOD GLUCOSE POCT
Whole Blood Glucose POCT: 133 mg/dL — ABNORMAL HIGH (ref 70–100)
Whole Blood Glucose POCT: 138 mg/dL — ABNORMAL HIGH (ref 70–100)
Whole Blood Glucose POCT: 145 mg/dL — ABNORMAL HIGH (ref 70–100)
Whole Blood Glucose POCT: 188 mg/dL — ABNORMAL HIGH (ref 70–100)

## 2024-08-15 MED ORDER — COLCHICINE 0.6 MG PO TABS: 0.6000 mg | ORAL_TABLET | Freq: Every day | ORAL | 0 refills | Status: AC

## 2024-08-15 MED ORDER — METOPROLOL TARTRATE 25 MG PO TABS: 12.5000 mg | ORAL_TABLET | Freq: Two times a day (BID) | ORAL | 0 refills | Status: AC

## 2024-08-15 NOTE — Plan of Care (Signed)
 Problem: Compromised Activity/Mobility  Goal: Activity/Mobility Interventions  Outcome: Progressing     Problem: Compromised Sensory Perception  Goal: Sensory Perception Interventions  Outcome: Progressing     Problem: Compromised Moisture  Goal: Moisture level Interventions  Outcome: Progressing     Problem: Compromised Nutrition  Goal: Nutrition Interventions  Outcome: Progressing     Problem: Compromised Friction/Shear  Goal: Friction and Shear Interventions  Outcome: Progressing     Problem: Altered GI Function  Goal: Fluid and electrolyte balance are achieved/maintained  Outcome: Progressing     Problem: Hemodynamic Status: Cardiac  Goal: Stable vital signs and fluid balance  Outcome: Progressing     Problem: Ineffective Gas Exchange  Goal: Effective breathing pattern  Outcome: Progressing     Problem: Moderate/High Fall Risk Score >5  Goal: Patient will remain free of falls  Outcome: Progressing     Problem: Fluid and Electrolyte Imbalance/ Endocrine  Goal: Fluid and electrolyte balance are achieved/maintained  Outcome: Progressing  Goal: Adequate hydration  Outcome: Progressing     Problem: Diabetes: Glucose Imbalance  Goal: Blood glucose stable at established goal  Outcome: Progressing     Problem: Safety  Goal: Patient will be free from injury during hospitalization  Outcome: Progressing  Goal: Patient will be free from infection during hospitalization  Outcome: Progressing     Problem: Pain  Goal: Pain at adequate level as identified by patient  Outcome: Progressing     Problem: Discharge Barriers  Goal: Patient will be discharged home or other facility with appropriate resources  Outcome: Progressing     Problem: Psychosocial and Spiritual Needs  Goal: Demonstrates ability to cope with hospitalization/illness  Outcome: Progressing

## 2024-08-15 NOTE — Discharge Instr - AVS First Page (Addendum)
 [facility] DISCHARGE INSTRUCTIONS     Date of Admission: 08/11/2024    Date of Discharge: 08/15/2024    Discharge Physician: Maude KATHEE Rave, MD    Thank you for choosing the [facility]  for your healthcare needs. It was a privilege caring for you. I am genuinely concerned about your health and comfort.      Below, please find detailed instructions regarding your medical care.     You were admitted for Pericardial effusion. It is important that you make your follow up appointments listed below.  Bring these discharge papers to your follow-up visit with your medical provider.   I cannot stress the importance of follow up enough.      Consultants: Infectious disease, cardiology, hematology/oncology    Pending Studies: Echocardiogram    Discharge Diet: Carbohydrate controlled    Surgery/Procedure: None    Further Instructions:  Follow up with cardiology within one week with repeat echocardiogram. Follow up with hematology/oncology.     Follow up Appointments:   Follow-up Information       [facility] -Cardiology Follow up.    Specialty: Cardiology  Contact information:  [address] [facility] [address]  [place]   [address]  [phone]             [place] Toribio MATSU, MD Follow up.    Specialties: Medical Oncology, Hematology  Contact information:  [address]  [address] [address]  [phone]               Lyle Laymon GAILS, FNP .    Specialty: Family Nurse Practitioner  Contact information:  [address]  [place] [address]  [phone]                                 Please take your medications as prescribed. As we discussed, please review the attached handouts for more information about important side effects of your medication.    If you have any fever, chest pain, palpitations, shortness of breath or difficulty breathing, worsening nausea and vomiting, or lower leg pain please seek medical attention immediately.     Finally, below please find the  results of imaging studies that you had in the hospital. Please review this with your primary care doctor.    You will likely be receiving a survey about the care you received in our hospital. It would mean a lot to me and all of the care team if you could fill that out and return the form. We're always looking to improve and your feedback will help us  do that.     I wish you good health. Please do not hesitate to contact me if I can be of assistance. I aim to provide excellent care.     Respectfully Yours,    Maude KATHEE Rave, MD        If you are unable to obtain an appointment, unable to obtain newly prescribed medications, or are unclear about any of your discharge instructions please contact your discharging physician at [phone] (M-F, 8am-3pm) or weekends and after hours via the hospital operator [phone], hospital case manager, or your primary care physician.    Imaging Studies while hospitalized:  US  Venous Up Extrem Duplex Dopp Uni Left  Result Date: 08/15/2024   1. Nonocclusive deep venous thrombosis of one of the paired left brachial veins and occlusive  superficial venous thrombosis of the left cephalic vein in the left upper extremity. 2. Urgent findings were discussed with Dr. SHAUNNA Rave at 1:07 PM on 08/15/2024. Daniel L. Overdeck, MD 08/15/2024 1:07 PM    CT Angiogram Chest Abdomen Pelvis  Result Date: 08/11/2024   1.Large pericardial effusion. Mild enhancement of the pericardium, which may reflect pericarditis. 2.No evidence of aortic dissection. 3.Cholelithiasis. 4.Colonic diverticulosis. Luke L. Patt, MD 08/11/2024 9:32 PM    Chest AP Portable  Result Date: 08/11/2024  Marked enlargement of the cardiac silhouette is new from 05/25/21. This may reflect cardiomegaly and/or pericardial effusion. Jeana RONAL Sor, MD 08/11/2024 8:14 PM    Echo Results       Procedure Component Value Units Date/Time    Echo Adult TTE Complete [[id number]] Collected: 08/12/24 0717     Updated: 08/12/24 0833     Visually  Estimated Ejection Fraction 60     Aortic Valve Findings No aortic stenosis.     Aortic Valve Findings No significant aortic regurgitation.     Pulmonary Valve Findings Normal appearing pulmonic valve leaflets.     Mitral Valve Findings No mitral stenosis.     Mitral Valve Findings No significant mitral regurgitation.     Tricuspid Valve Findings No tricuspid stenosis.     Tricuspid Valve Findings Mild tricuspid regurgitation.     LA Volume Index (BP A-L) 69.4605415868396     MV E/e' (Average) 3.8478539086840     IVS Diastolic Thickness (2D) 1.2     LVID diastole (2D) 3.1     LVID systole (2D) 2.2     LA Dimension (2D) 2.9     AV Mean Gradient 2     AV Area (Cont Eq VTI) 7.8203677374     Prox Ascending Aorta Diameter 2.7     MV E/A [id number]     RV Basal Diastolic Dimension 3.4     TAPSE 1.29     Summary --     ECHO ADULT TTE COMPLETE   Date: 08/12/2024   Normal left ventricular ejection fraction.   By visual assessment, LV EF is 60%.   No evidence of right ventricular diastolic collapse.   No evidence of right atrial diastolic collapse.   The effusion measures 3.0cm posteriorly and 0.9-1.3cm adjacent to the right ventricle in the subcostal view, and 0.3cm apically.   Large pericardial effusion without echocardiographic evidence of tamponade.   IVC measures 1.5cm with > 50% respirophasic collapse. The estimated RA pressure is .         Narrative:      Name:     Helen [name]  Age:     78 years  DOB:     Jun 20, 1946  Gender:     Female  MRN:     [id number]  Wt:     152 lb  BSA:     1.74 m2  HR:     124 bpm  Systolic BP:     89 mmHg  Diastolic BP:     51 mmHg  Technical Quality:     Adequate  Exam Date/Time:     08/12/2024 7:17 AM  Study Site:     [facility]  Exam Type:     ECHO ADULT TTE COMPLETE    Study Info  Indications      Pericardial effusion -  Procedure    Complete two-dimensional, color flow and spectral Doppler transthoracic  echocardiogram is performed.    Staff  Sonographer:     Jereld  Wilson  RCS  Ordering Provider:     Katheren JAYSON Sharps MD    61 in      Summary    * Large pericardial effusion without echocardiographic evidence of  tamponade.    * No evidence of right ventricular diastolic collapse.    * No evidence of right atrial diastolic collapse.    * IVC measures 1.5cm with > 50% respirophasic collapse. The estimated RA  pressure is .    * The effusion measures 3.0cm posteriorly and 0.9-1.3cm adjacent to the  right ventricle in the subcostal view, and 0.3cm apically.    * Normal left ventricular ejection fraction.    * By visual assessment, LV EF is 60%.      Findings  Left Ventricle    Small LV size (3.1cm). Normal left ventricular ejection fraction. No wall  motion abnormalities seen. By visual assessment, LV EF is 60%.  Right Ventricle    No evidence of right ventricular diastolic collapse. Normal RV size.      Left Atrium    The left atrium is normal in size.    Right Atrium    Mild right atrial dilation. No evidence of right atrial diastolic collapse.      Aortic Valve    No aortic stenosis. No significant aortic regurgitation.    Pulmonary Valve    Normal appearing pulmonic valve leaflets.    Mitral Valve    No mitral stenosis. No significant mitral regurgitation.    Tricuspid Valve    No tricuspid stenosis. Mild tricuspid regurgitation.      Other Findings    The patient was tachycardic during this study.    Aorta    The aorta is normal in size.    Inferior Vena Cava    IVC measures 1.5cm with > 50% respirophasic collapse. The estimated RA  pressure is .    Pericardium / Pleural Effusion    Large pericardial effusion without echocardiographic evidence of tamponade.  The effusion measures 3.0cm posteriorly and 0.9-1.3cm adjacent to the right  ventricle in the subcostal view, and 0.3cm apically. No significant  respirophasic mitral in-flow variation.      Measurements  2D Measurements  ----------------------------------------------------------------------  Name                                  Value        Normal  ----------------------------------------------------------------------    Parasternal 2D  ----------------------------------------------------------------------   IVS Diastolic Thickness  (2D)                               1.20 cm     0.60-0.90 LVID Diastole (2D)                 3.10 cm     3.80-5.20  LVIW Diastolic Thickness  (2D)                               1.20 cm     0.60-0.95 LVID Systole (2D)                  2.20 cm     2.20-3.50 LVOT Diameter                      1.90 cm  LA Dimension (2D)                  2.90 cm     2.70-3.80 Prox Asc Ao Diameter               2.70 cm     2.30-3.10     LV Ejection Fraction 2D  ----------------------------------------------------------------------  Visually Estimated EF                 60 %         54-74 LV EF (BP MOD)                        55 %         54-74     Apical 2D Dimensions  ----------------------------------------------------------------------   RV Basal Diastolic  Dimension                          3.40 cm     2.50-4.10 LA Volume Index (BP A-L)        30.54 ml/m2   16.00-34.00 RA Area (4C)                     19.00 cm2       <=18.00  M-mode Measurements  ----------------------------------------------------------------------  Name                                 Value        Normal  ----------------------------------------------------------------------    M-Mode  ----------------------------------------------------------------------  TAPSE                              1.29 cm        >=1.60  LVOT/Aortic Valve Doppler Measurements  ----------------------------------------------------------------------  Name                                 Value        Normal  ----------------------------------------------------------------------    LVOT Doppler  ----------------------------------------------------------------------  LVOT Peak Velocity                0.76 m/s                   AoV  Doppler  ----------------------------------------------------------------------  AV Mean Gradient                    2 mmHg           <20 AV Area (Cont Eq VTI)             2.18 cm2        >=3.00 AV Area Index (Cont Eq VTI)     1.25 cm2/m2  RVOT/Pulmonic Valve Doppler Measurements  ----------------------------------------------------------------------  Name                                 Value        Normal  ----------------------------------------------------------------------    PV Doppler  ----------------------------------------------------------------------  PV Peak Velocity                  0.77 m/s  Mitral Valve Measurements  ----------------------------------------------------------------------  Name  Value        Normal  ----------------------------------------------------------------------    MV Doppler  ----------------------------------------------------------------------  MV E Peak Velocity                0.46 m/s                 MV A Peak Velocity                0.72 m/s                 MV E/A                                0.64                   MV Annular TDI  ----------------------------------------------------------------------  MV Septal e' Velocity             0.08 m/s        >=0.07 MV E/e' (Septal)                      5.57        <=8.00 MV Lateral e' Velocity            0.07 m/s        >=0.10 MV E/e' (Lateral)                     6.73  Tricuspid Valve Measurements  ----------------------------------------------------------------------  Name                                 Value        Normal  ----------------------------------------------------------------------    TV Regurgitation Doppler  ----------------------------------------------------------------------  TR Peak Velocity                  1.80 m/s        <=2.80  Aorta / Venous Measurements  ----------------------------------------------------------------------  Name                                 Value         Normal  ----------------------------------------------------------------------    IVC/SVC  ----------------------------------------------------------------------  IVC Diameter (Exp 2D)              1.60 cm        <=2.10      Report Signatures  Finalized by Dorn LILLETTE Calender  MD on 08/12/2024 08:32 AM  Preliminary by Jereld Blush  RCS on 08/12/2024 07:58 AM    Echo Adult TTE Limited [[id number]] Collected: 08/14/24 0820     Updated: 08/14/24 0947     Visually Estimated Ejection Fraction 60     Mitral Valve Findings The mitral valve is structurally normal.     Tricuspid Valve Findings The tricuspid valve is structurally normal.     Summary --     ECHO ADULT TTE LIMITED   Date: 08/14/2024   Limited study performed for pericardial effusion.   Compared to the prior study dated 08/13/2024, there have been no significant changes.   Patient was tachycardic throughout the study.   No pericardial effusion visualized.         Narrative:      Name:     Helen [name]  Age:  78 years  DOB:     Apr 18, 1946  Gender:     Female  MRN:     [id number]  HR:     99 bpm  Technical Quality:     Good  Exam Date/Time:     08/14/2024 8:20 AM  Study Site:     [facility]  Exam Type:     ECHO ADULT TTE LIMITED    Study Info  Indications      Pericardial effusion,  evaluate for changes -  Procedure    Limited two-dimensional transthoracic echocardiogram is performed.    Staff  Sonographer:     Elza Cramp RDCS  Ordering Provider:     Lonni JAYSON Gobble      Summary    * Limited study performed for pericardial effusion.    * No pericardial effusion visualized.    * Patient was tachycardic throughout the study.    * Compared to the prior study dated 08/13/2024, there have been no  significant changes.      Findings  Left Ventricle    Left ventricular systolic function is visually normal with an estimated  ejection fraction of 60-65%. Septal bounce present.      Mitral Valve    The mitral valve is structurally normal.    Tricuspid Valve     The tricuspid valve is structurally normal.      Aorta    The aortic root is not well visualized. The proximal ascending aorta is not  well visualized.    Inferior Vena Cava    The IVC is normal in size with > 50% respiratory variance consistent with  normal RA pressure of 3 mmHg.    Pericardium / Pleural Effusion    No pericardial effusion visualized.      Measurements  2D Measurements  ----------------------------------------------------------------------  Name                                 Value        Normal  ----------------------------------------------------------------------    LV Ejection Fraction 2D  ----------------------------------------------------------------------  Visually Estimated EF                 60 %         54-74      Report Signatures  Finalized by Ian DELENA Jernigan  MD, FACC on 08/14/2024 09:46 AM  Preliminary by Elza Cramp  RDCS on 08/14/2024 09:33 AM    Echo Adult TTE Limited [[id number]] Collected: 08/13/24 1321     Updated: 08/13/24 1605     Summary --     ECHO ADULT TTE LIMITED   Date: 08/13/2024   Normal left ventricular ejection fraction.   By visual assessment, LV EF is 60%.   Limited study for pericardial effusion.   There is no pericardial effusion.         Narrative:      Name:     Helen [name]  Age:     46 years  DOB:     1946/05/02  Gender:     Female  MRN:     [id number]  HR:     132 bpm  Technical Quality:     Adequate  Exam Date/Time:     08/13/2024 1:21 PM  Study Site:     [facility]  Exam Type:     ECHO ADULT TTE LIMITED    Study Info  Indications  Pericardial effusion -  Procedure    Limited two-dimensional transthoracic echocardiogram is performed.    Staff  Sonographer:     Elza Cramp RDCS  Ordering Provider:     Dorn LILLETTE Calender MD      Summary    * Limited study for pericardial effusion.    * There is no pericardial effusion.    * Normal left ventricular ejection fraction.    * By visual assessment, LV EF is 60%.      Findings  Left Ventricle    Normal left  ventricular ejection fraction. By visual assessment, LV EF is  60%.      Other Findings    The patient is tachycardic during this study.    Inferior Vena Cava    IVC measures 1.5cm with > 50% respirophasic collapse. The estimated RA  pressure is .    Pericardium / Pleural Effusion    There is no pericardial effusion.      Report Signatures  Finalized by Dorn LILLETTE Calender  MD on 08/13/2024 04:04 PM  Preliminary by Elza Cramp  RDCS on 08/13/2024 01:45 PM    Echo Adult TTE Limited [[id number]] Collected: 08/12/24 0957     Updated: 08/12/24 1236     Visually Estimated Ejection Fraction Range 65-70     Summary --     ECHO ADULT TTE LIMITED   Date: 08/12/2024   Trace pericardial effusion following pericardiocentesis (470 cc of fluid removed)   Left ventricular systolic function is normal with an estimated ejection fraction of 65-70%.   No echocardiographic evidence of cardiac tamponade.   Moderate to large cirumferential pericardial effusion visualized measuring up to 3.2 cm posteriorly on apical views (subcostal view was not obtained) prior to pericardiocentesis         Narrative:      Name:     Helen [name]  Age:     36 years  DOB:     [birth year]  Gender:     Female  MRN:     [id number]  HR:     98 bpm  Technical Quality:     Adequate  Exam Date/Time:     08/12/2024 9:57 AM  Study Site:     [facility]  Exam Type:     ECHO ADULT TTE LIMITED    Study Info  Indications      Pericardial effusion -     I31.39-Other pericardial effusion (noninflammatory) -  Procedure    Limited two-dimensional transthoracic echocardiogram is performed.    Staff  Sonographer:     Jereld Blush RCS  Ordering Provider:     Ahsan Q Jafir DO      Summary    * Moderate to large cirumferential pericardial effusion visualized measuring  up to 3.2 cm posteriorly on apical views (subcostal view was not obtained)  prior to pericardiocentesis.    * Trace pericardial effusion following pericardiocentesis (470 cc of fluid  removed).    * No  echocardiographic evidence of cardiac tamponade.    * Left ventricular systolic function is normal with an estimated ejection  fraction of 65-70%.      Findings  Left Ventricle    Left ventricular systolic function is normal with an estimated ejection  fraction of 65-70%. No regional wall motion abnormalities noted.      Pericardium / Pleural Effusion    Moderate to large cirumferential pericardial effusion visualized measuring  up to 3.2 cm posteriorly on apical views (subcostal view  was not obtained)  prior to pericardiocentesis. No echocardiographic evidence of cardiac  tamponade.      Report Signatures  Finalized by Cinderella Outhouse  MD on 08/12/2024 12:35 PM  Preliminary by Jereld Blush  RCS on 08/12/2024 10:59 AM          Results for orders placed or performed during the hospital encounter of 09/05/20   CT Head WO Contrast    Narrative    CT HEAD WO CONTRAST    CLINICAL INDICATION: Headache. Fall. Injury.    TECHNIQUE:  Noncontrast CT scan of the head was performed. Axial images  were obtained. Sagittal and coronal MPR reformatting performed.  CT Dose reduction technique: One or more the following dose reduction  techniques were utilized: Automated exposure control; Adjustment of the  MVA and/or KVP according to patient's size; Use of the iterative  reconstruction technique.    FINDINGS:  The ventricles and cisterns are clear. No acute bleed. No  acute cortical ischemic abnormality. No mass effect or midline shift. No  gross abnormality in the posterior fossa; beam hardening artifacts are  seen.  No evidence for an acute intracranial abnormality.        Impression        NO ACUTE INTRACRANIAL ABNORMALITY.    Mennie Skene, MD   09/05/2020 9:40 PM       Please be sure to review these imaging studies with your primary medical provider.

## 2024-08-15 NOTE — Progress Notes (Signed)
 Discharged before [facility] order placed for [facility] PT/OT which patient would accept and [facility]. Post discharge. CM management to follow up.    08/15/24 1812   Medicare Checklist   Is this a Medicare patient? Yes   Discharge Disposition   Physical Discharge Disposition Home, Home Health   Mode of Transportation Car   Patient/Family/POA notified of transfer plan Yes   Patient agreeable to discharge plan/expected d/c date? Yes   Family/POA agreeable to discharge plan/expected d/c date? Yes   Bedside nurse notified of transport plan? Yes   CM Interventions   Multidisciplinary rounds/family meeting before d/c? Yes

## 2024-08-15 NOTE — Plan of Care (Signed)
 Problem: Pain interferes with ability to perform ADL  Goal: Pain at adequate level as identified by patient  08/15/2024 0911 by Shona Palma, RN  Outcome: Progressing  Flowsheets (Taken 08/14/2024 2017 by Charlet Mavis Dragon, RN)  Pain at adequate level as identified by patient: Identify patient comfort function goal  08/15/2024 0910 by Shona Palma, RN  Outcome: Progressing     Problem: Side Effects from Pain Analgesia  Goal: Patient will experience minimal side effects of analgesic therapy  08/15/2024 0911 by Shona Palma, RN  Outcome: Progressing  Flowsheets (Taken 08/14/2024 2017 by Charlet Mavis Dragon, RN)  Patient will experience minimal side effects of analgesic therapy: Monitor/assess patient's respiratory status (RR depth, effort, breath sounds)  08/15/2024 0910 by Shona Palma, RN  Outcome: Progressing     Problem: Compromised Sensory Perception  Goal: Sensory Perception Interventions  08/15/2024 0911 by Shona Palma, RN  Outcome: Progressing  08/15/2024 0910 by Shona Palma, RN  Outcome: Progressing     Problem: Compromised Moisture  Goal: Moisture level Interventions  08/15/2024 0911 by Shona Palma, RN  Outcome: Progressing  08/15/2024 0910 by Shona Palma, RN  Outcome: Progressing     Problem: Compromised Nutrition  Goal: Nutrition Interventions  08/15/2024 0911 by Shona Palma, RN  Outcome: Progressing  08/15/2024 0910 by Shona Palma, RN  Outcome: Progressing     Problem: Compromised Friction/Shear  Goal: Friction and Shear Interventions  08/15/2024 0911 by Shona Palma, RN  Outcome: Progressing  08/15/2024 0910 by Shona Palma, RN  Outcome: Progressing     Problem: Altered GI Function  Goal: Fluid and electrolyte balance are achieved/maintained  08/15/2024 0911 by Shona Palma, RN  Outcome: Progressing  Flowsheets (Taken 08/14/2024 1340 by Kristy Colon, Sherlean, RN)  Fluid and electrolyte balance are achieved/maintained:   Monitor/assess lab values and report abnormal values   Assess and  reassess fluid and electrolyte status   Observe for cardiac arrhythmias   Monitor for muscle weakness  08/15/2024 0910 by Shona Palma, RN  Outcome: Progressing     Problem: Hemodynamic Status: Cardiac  Goal: Stable vital signs and fluid balance  08/15/2024 0911 by Shona Palma, RN  Outcome: Progressing  Flowsheets (Taken 08/14/2024 1340 by Kristy Colon, Christian, RN)  Stable vital signs and fluid balance:   Assess signs and symptoms associated with cardiac rhythm changes   Monitor lab values  08/15/2024 0910 by Shona Palma, RN  Outcome: Progressing     Problem: Inadequate Tissue Perfusion  Goal: Adequate tissue perfusion will be maintained  08/15/2024 0911 by Shona Palma, RN  Outcome: Progressing  Flowsheets (Taken 08/14/2024 1340 by Kristy Colon, Christian, RN)  Adequate tissue perfusion will be maintained:   Monitor/assess lab values and report abnormal values   Monitor/assess neurovascular status (pulses, capillary refill, pain, paresthesia, paralysis, presence of edema)   Monitor/assess for signs of VTE (edema of calf/thigh redness, pain)   Monitor for signs and symptoms of a pulmonary embolism (dyspnea, tachypnea, tachycardia, confusion)   Encourage/assist patient as needed to turn, cough, and perform deep breathing every 2 hours  08/15/2024 0910 by Shona Palma, RN  Outcome: Progressing     Problem: Ineffective Gas Exchange  Goal: Effective breathing pattern  08/15/2024 0911 by Shona Palma, RN  Outcome: Progressing  Flowsheets (Taken 08/14/2024 1340 by Kristy Colon, Christian, RN)  Effective breathing pattern:   Maintain CO2 level per LIP order   Teach/reinforce use of ordered respiratory interventions (ie. CPAP, BiPAP, Incentive Spirometer, Acapella)  08/15/2024 0910 by Shona Palma, RN  Outcome: Progressing     Problem: Moderate/High Fall Risk Score >5  Goal: Patient will remain free of falls  08/15/2024 0911 by Shona Palma, RN  Outcome: Progressing  Flowsheets  Taken 08/14/2024 2100 by Charlet Mavis Dragon, RN  Moderate Risk (6-13):   MOD-Remain with patient during toileting   MOD-Place bedside commode and assistive devices out of sight when not in use   MOD-Perform dangle, stand, walk (DSW) prior to mobilization   MOD-Use gait belt when appropriate  Taken 08/14/2024 1340 by Kristy Colon, Christian, RN  High (Greater than 13):   HIGH-Consider use of low bed   MOD-Perform dangle, stand, walk (DSW) prior to mobilization   HIGH-Visual cue at entrance to patient's room   MOD-Place Fall Risk level on whiteboard in room   HIGH-Apply yellow Fall Risk arm band   HIGH-Bed alarm on at all times while patient in bed   HIGH-Initiate use of floor mats as appropriate   MOD-Use of assistive devices -Bedside Commode if appropriate  VH High Risk (Greater than 13):   Use of floor mat   Use of STOP ask for help sign   Keep door open for better visibility   PATIENT IS TO BE SUPERVISED FOR ALL TOILETING ACTIVITIES   A CHAIR PAD ALARM WILL BE USED WHEN PATIENT IS UP SITTING IN A CHAIR   BED ALARM WILL BE ACTIVATED WHEN THE PATEINT IS IN BED WITH SIGNAGE RESET BED ALARM   RED HIGH FALL RISK SIGNAGE  08/15/2024 0910 by Shona Palma, RN  Outcome: Progressing     Problem: Fluid and Electrolyte Imbalance/ Endocrine  Goal: Fluid and electrolyte balance are achieved/maintained  08/15/2024 0911 by Shona Palma, RN  Outcome: Progressing  Flowsheets (Taken 08/14/2024 1340 by Kristy Colon, Christian, RN)  Fluid and electrolyte balance are achieved/maintained:   Monitor/assess lab values and report abnormal values   Assess and reassess fluid and electrolyte status   Observe for cardiac arrhythmias   Monitor for muscle weakness  08/15/2024 0910 by Shona Palma, RN  Outcome: Progressing  Goal: Adequate hydration  08/15/2024 0911 by Shona Palma, RN  Outcome: Progressing  Flowsheets (Taken 08/14/2024 1340 by Kristy Colon, Sherlean, RN)  Adequate hydration:   Assess mucus membranes, skin color, turgor, perfusion and presence of edema   Assess  for peripheral, sacral, periorbital and abdominal edema   Monitor and assess vital signs and perfusion  08/15/2024 0910 by Shona Palma, RN  Outcome: Progressing     Problem: Diabetes: Glucose Imbalance  Goal: Blood glucose stable at established goal  08/15/2024 0911 by Shona Palma, RN  Outcome: Progressing  Flowsheets (Taken 08/14/2024 1340 by Kristy Colon, Christian, RN)  Blood glucose stable at established goal:   Monitor lab values   Monitor intake and output.  Notify LIP if urine output is < 30 mL/hour.   Follow fluid restrictions/IV/PO parameters   Assess for hypoglycemia /hyperglycemia   Monitor/assess vital signs   Include patient/family in decisions related to nutrition/dietary selections   Coordinate medication administration with meals, as indicated   Ensure appropriate diet and assess tolerance   Ensure adequate hydration   Ensure appropriate consults are obtained (Nutrition, Diabetes Education, and Case Management)   Ensure patient/family has adequate teaching materials  08/15/2024 0910 by Shona Palma, RN  Outcome: Progressing     Problem: Safety  Goal: Patient will be free from injury during hospitalization  08/15/2024 0911 by Shona Palma, RN  Outcome: Progressing  Flowsheets (Taken 08/14/2024 1340 by Kristy Colon,  Christian, RN)  Patient will be free from injury during hospitalization:   Assess patient's risk for falls and implement fall prevention plan of care per policy   Provide and maintain safe environment   Ensure appropriate safety devices are available at the bedside   Use appropriate transfer methods   Include patient/ family/ care giver in decisions related to safety   Hourly rounding   Assess for patients risk for elopement and implement Elopement Risk Plan per policy  08/15/2024 0910 by Shona Palma, RN  Outcome: Progressing  Goal: Patient will be free from infection during hospitalization  08/15/2024 0911 by Shona Palma, RN  Outcome: Progressing  Flowsheets (Taken 08/13/2024 2249 by  Quilla Jacinto HERO, RN)  Free from Infection during hospitalization:   Assess and monitor for signs and symptoms of infection   Monitor lab/diagnostic results   Monitor all insertion sites (i.e. indwelling lines, tubes, urinary catheters, and drains)  08/15/2024 0910 by Shona Palma, RN  Outcome: Progressing     Problem: Pain  Goal: Pain at adequate level as identified by patient  08/15/2024 0911 by Shona Palma, RN  Outcome: Progressing  Flowsheets (Taken 08/14/2024 2017 by Charlet Mavis Dragon, RN)  Pain at adequate level as identified by patient: Identify patient comfort function goal  08/15/2024 0910 by Shona Palma, RN  Outcome: Progressing     Problem: Discharge Barriers  Goal: Patient will be discharged home or other facility with appropriate resources  08/15/2024 0911 by Shona Palma, RN  Outcome: Progressing  Flowsheets (Taken 08/15/2024 0911)  Discharge to home or other facility with appropriate resources:   Provide appropriate patient education   Initiate discharge planning  08/15/2024 0910 by Shona Palma, RN  Outcome: Progressing     Problem: Psychosocial and Spiritual Needs  Goal: Demonstrates ability to cope with hospitalization/illness  08/15/2024 0911 by Shona Palma, RN  Outcome: Progressing  Flowsheets (Taken 08/14/2024 1340 by Kristy Colon, Sherlean, RN)  Demonstrates ability to cope with hospitalizations/illness:   Encourage verbalization of feelings/concerns/expectations   Provide quiet environment   Assist patient to identify own strengths and abilities   Encourage patient to set small goals for self   Include patient/ patient care companion in decisions  08/15/2024 0910 by Shona Palma, RN  Outcome: Progressing

## 2024-08-15 NOTE — Discharge Summary (Signed)
 [facility]  Internal Medicine Hospitalists  Discharge Summary          Date of Admission: 08/11/2024  Date of Discharge: 08/15/2024  5:32 PM    Discharge Diagnoses:   Pericardial effusion, s/p pericardiocentesis   Undifferentiated shock, resolved  Left brachial nonocclusive thrombus (PICC line associated)  Superficial venous thrombosis of left cephalic vein  AKI on CKD  Hypertension  Hyperlipidemia  SVT  Chronic RBBB  Anemia of chronic disease  Type 2 Diabetes with Hyperglycemia  History of breast cancer         Additional Diagnoses:              Hospital Course:      Reason for Admission:  Rozanna Cormany is a 78 y.o. female with past medical history of hypertension, edema, CKD, type 2 diabetes, breast cancer who presented with palpitations, nausea and decreased appetite, found to be tachycardic, hypotensive found to have large pericardial effusion      Hospital Course:  Pericardial effusion, s/p pericardiocentesis   On arrival to ED, chest x-ray with markedly enlarged cardiac silhouette, CT with large pericardial effusion.  Transferred to [facility], patient became hypotensive. STAT echo with large pericardial effusion without evidence of tamponade.  Pericardiocentesis 12/10 performed and drain left in place.  Fluid hemorrhagic.  Repeat echocardiogram without evidence of recurrence, drain removed.  Follow-up echocardiogram 12/12 again without effusion.  - Unclear etiology.  Oncology consulted for possible malignant cause.  Cytology negative.  Imaging without evidence of primary.  Possibly infectious cause, fluid cultures no growth to date.    - Followed by cardiology ([facility]), recommend close follow up and TTE within one week  - Continue colchicine    - Cardiology recommending avoidance of blood thinners     Undifferentiated shock, resolved  Initially thought to be in setting of pericardial effusion.  Monitored in ICU after pericardiocentesis with with persistent vasopressor requirement, unclear  etiology given resolution of pericardial effusion.  Concern for possible septic shock.  Started on IV antibiotics empirically.  UA noninfectious appearing.  CT chest/abdomen/pelvis without evidence of localizing source of infection.  Blood and pericardial fluid cultures negative.  Able to be weaned off IV vasopressors  - Infectious disease consulted, antibiotics discontinued 12/12. Monitored with stability     Left brachial nonocclusive thrombus (PICC line associated)  Superficial venous thrombosis of left cephalic vein  Left upper extremity swelling 12/13 after PICC line removal. US  with nonocclusive DVT of left brachial vein and occlusive superficial venous thrombus of left cephalic vein  - Discussed with cardiology 12/13, high risk for initiation of anticoagulation in setting of hemorrhagic pericarditis/pericardial effusion without clear etiology.   - Discussed case with hematology ([facility] Dr. Darron) 12/13. In setting of recent hemorrhagic pleural effusion and provoked PICC associated brachial thrombosis (PICC line removed), risks of anticoagulation outweighs benefits at this time. May benefit from initiation of anticoagulation if/when cleared by cardiology (plan for follow up and repeat TTE week after discharge).  - Discussed clinical situation as well as risks/benefits of anticoagulation with patient and family. Patient and family expressed understanding and agreement with plan to hold anticoagulation at this time. Strict monitoring/return precautions given for worsening LUE pain, swelling, chest pain, SOB etc.    AKI on CKD  Baseline creatinine 0.8-1, mild AKI 12/11.  Resolved     Hypertension  Held in setting of shock. Blood pressure improved, hypertensive prior to discharge. Will plan for slow re-initiation of antihypertensives.   -Restart amlodipine   -  Continue to hold valsartan /HCTZ, can re-initiate if BP remains elevated in outpatient setting     Hyperlipidemia  Continue atorvastatin      SVT  Chronic  RBBB  Initiated on metoprolol  tartrate 12.5 mg BID     Anemia of chronic disease  Outpatient follow-up     Type 2 Diabetes with Hyperglycemia  Continue home regimen     History of breast cancer  Outpatient follow-up     On day of discharge patient hemodynamically stable, no chest pain.  Plan of care and return precautions discussed with patient who expressed agreement and understanding.           Discharge Day Exam:   Temp:  [98.1 F (36.7 C)-98.2 F (36.8 C)] 98.2 F (36.8 C)  Heart Rate:  [80-94] 83  Resp Rate:  [14-18] 14  BP: (120-168)/(71-81) 168/81  Physical Exam  Vitals and nursing note reviewed.   Constitutional:       General: She is not in acute distress.     Appearance: Normal appearance.   HENT:      Head: Normocephalic and atraumatic.   Cardiovascular:      Rate and Rhythm: Normal rate and regular rhythm.   Pulmonary:      Effort: Pulmonary effort is normal. No respiratory distress.      Breath sounds: Normal breath sounds.   Skin:     General: Skin is warm and dry.   Neurological:      Mental Status: She is alert and oriented to person, place, and time. Mental status is at baseline.      Coordination: Coordination normal.   Psychiatric:         Mood and Affect: Mood normal.         Behavior: Behavior normal.           Pertinent Labs:   CBC:   Recent Labs   Lab 08/15/24  1143 08/15/24  0442 08/14/24  0338   WBC 7.93 8.34 11.33*   Hemoglobin 10.5* 8.5* 10.3*   Hematocrit 33.0* 27.0* 32.2*   Platelet Count 388* 323 339   MCV 69.9* 69.9* 70.0*     BMP:   Recent Labs   Lab 08/15/24  0442 08/14/24  0338 08/13/24  0341   Sodium 142 139 139   Potassium 4.3 4.8 3.8   Chloride 112* 110 109   CO2 23 18 15*   BUN 11 19 24*   Creatinine 0.6 0.8 1.1*   Calcium  8.0 8.1 7.8*   Glucose 136* 145* 149*     LFT:   Recent Labs   Lab 08/11/24  1934   Albumin 3.3*   Protein, Total 8.0   Bilirubin, Total 0.9   Alkaline Phosphatase 85   ALT 15   AST (SGOT) 31           Radiology and Procedures:   Radiology: all results from  this admission  US  Venous Up Extrem Duplex Dopp Uni Left  Result Date: 08/15/2024   1. Nonocclusive deep venous thrombosis of one of the paired left brachial veins and occlusive superficial venous thrombosis of the left cephalic vein in the left upper extremity. 2. Urgent findings were discussed with Dr. SHAUNNA Rave at 1:07 PM on 08/15/2024. Daniel L. Overdeck, MD 08/15/2024 1:07 PM    CT Angiogram Chest Abdomen Pelvis  Result Date: 08/11/2024   1.Large pericardial effusion. Mild enhancement of the pericardium, which may reflect pericarditis. 2.No evidence of aortic dissection. 3.Cholelithiasis. 4.Colonic diverticulosis. Herlene  FREDRIK Cave, MD 08/11/2024 9:32 PM    Chest AP Portable  Result Date: 08/11/2024  Marked enlargement of the cardiac silhouette is new from 05/25/21. This may reflect cardiomegaly and/or pericardial effusion. Nitin A. Kumar, MD 08/11/2024 8:14 PM     Discharge Medications and Documented Allergies:        Discharge Medication List        PAUSE taking these medications      valsartan -hydroCHLOROthiazide  160-12.5 MG per tablet  Wait to take this until your doctor or other care provider tells you to start again.  Dose: 1 tablet  Commonly known as: DIOVAN -HCT  TAKE 1 TABLET BY MOUTH EVERY DAY            Taking      * albuterol  sulfate HFA 108 (90 Base) MCG/ACT inhaler  Dose: 2 puff  Commonly known as: PROVENTIL   INHALE 2 PUFFS INTO THE LUNGS EVERY 4 HOURS AS NEEDED FOR WHEEZING OR SHORTNESS OF BREATH.     * albuterol  (2.5 MG/3ML) 0.083% nebulizer solution  Dose: 2.5 mg  Commonly known as: PROVENTIL   TAKE 3 MLS (2.5 MG TOTAL) BY NEBULIZATION EVERY 4 (FOUR) HOURS AS NEEDED FOR SHORTNESS OF BREATH OR WHEEZING     amLODIPine  10 MG tablet  Dose: 10 mg  Commonly known as: NORVASC   For: High Blood Pressure  TAKE 1 TABLET (10 MG) BY MOUTH DAILY.     atorvastatin  40 MG tablet  Dose: 40 mg  Commonly known as: LIPITOR  TAKE 1 TABLET BY MOUTH EVERY DAY     Azelastine  HCl 137 MCG/SPRAY Soln  Dose: 2 spray  2 SPRAYS BY NASAL  ROUTE 2 (TWO) TIMES DAILY USE IN EACH NOSTRIL AS DIRECTED     cetirizine 10 MG tablet  Dose: 10 mg  Commonly known as: ZyrTEC  Take 1 tablet (10 mg) by mouth once daily     colchicine  0.6 MG tablet  Dose: 0.6 mg  Start taking on: August 16, 2024  Take 1 tablet (0.6 mg) by mouth once daily     dexAMETHasone 0.1 % ophthalmic solution  Dose: 1 drop  Commonly known as: DECADRON  Place 1 drop into both eyes     EPINEPHrine  0.3 MG/0.3ML Soaj injection  Dose: 0.3 mg  Inject 0.3 mLs (0.3 mg) into the muscle once as needed (Anaphylaxis)     esomeprazole  40 MG capsule  Dose: 40 mg  Commonly known as: NexIUM   Take 1 capsule (40 mg) by mouth once daily     ferrous sulfate 325 (65 FE) MG tablet  Dose: 325 mg  Take 1 tablet (325 mg) by mouth every morning with breakfast     fluticasone  220 MCG/ACT inhaler  Dose: 1 puff  Commonly known as: FLOVENT  HFA  INHALE 1 PUFF INTO THE LUNGS TWICE A DAY     fluticasone  50 MCG/ACT nasal spray  Dose: 2 spray  Commonly known as: FLONASE   2 sprays by Nasal route once daily     glipiZIDE  10 MG tablet  Dose: 20 mg  Commonly known as: GLUCOTROL   Take 2 tablets (20 mg) by mouth 2 (two) times daily before meals     Glucagon  Emergency 1 MG Kit  Dose: 1 mg  Inject 1 mg into the skin once as needed (severe hypoglycemia)     hydrocortisone  25 MG suppository  Commonly known as: ANUSOL -HC  PLACE 1 SUPPOSITORY (25 MG) RECTALLY 2 (TWO) TIMES DAILY- NOT COVERED     Jardiance  25 MG tablet  Dose: 25 mg  Generic drug: empagliflozin   TAKE 1 TABLET BY MOUTH EVERY DAY IN THE MORNING     lidocaine  5 %  Dose: 1 patch  Commonly known as: Lidoderm   Place 1 patch onto the skin once daily as needed (pain) Remove & Discard patch within 12 hours     metFORMIN  500 MG 24 hr tablet  Commonly known as: GLUCOPHAGE -XR  TAKE 2 TABLETS (1,000 MG) BY MOUTH TWICE A DAY     metoprolol  tartrate 25 MG tablet  Dose: 12.5 mg  Commonly known as: LOPRESSOR   Take 0.5 tablets (12.5 mg) by mouth every 12 (twelve) hours     montelukast 10 MG  tablet  Dose: 10 mg  Commonly known as: SINGULAIR  Take 1 tablet (10 mg) by mouth once daily     Nasonex 50 MCG/ACT nasal spray  Dose: 2 spray  Generic drug: mometasone  2 sprays by Nasal route once daily as needed     ondansetron  4 MG disintegrating tablet  Dose: 4 mg  Commonly known as: ZOFRAN -ODT  Dissolve 1 tablet (4 mg) in the mouth every 6 (six) hours as needed for Nausea     OneTouch Ultra 2 w/Device Kit  One daily     OneTouch Ultra test strip  Generic drug: glucose blood  TWICE A DAY     onetouch ultrasoft lancets  Use to check blood sugars 2 times daily     Tradjenta  5 MG Tabs  Dose: 1 tablet  Generic drug: linaGLIPtin   TAKE 1 TABLET BY MOUTH EVERY DAY     Vitamin D3 2000 UNIT capsule  TAKE 1 CAPSULE BY MOUTH EVERY DAY           * This list has 2 medication(s) that are the same as other medications prescribed for you. Read the directions carefully, and ask your doctor or other care provider to review them with you.                STOP taking these medications      diazePAM  5 MG tablet  Commonly known as: VALIUM      estradiol 0.01 % vaginal cream  Commonly known as: ESTRACE     hydrOXYzine 25 MG tablet  Commonly known as: ATARAX     meloxicam  7.5 MG tablet  Commonly known as: Mobic               Allergies[1]         Disposition:     Discharge Disposition: Home with family    Discharge Code Status: Full Code    Patient Emergency Contact:  Extended Emergency Contact Information  Primary Emergency Contact: Gunnison,Tahsha  Address: [address]           [place], [place] [address] [place]  Mobile Phone: [phone]  Relation: Daughter  Secondary Emergency Contact: Candelario,Tabatha  Mobile Phone: [phone]  Relation: Daughter  Interpreter needed? No    Discharge Instructions:     Patient Instructions: (See AVS for full details)  -Follow up with cardiology this week with repeat echocardiogram  -Follow up with hematology/oncology  -Monitor swelling of left arm    Most Recent Diet  Order:  Orders Placed This Encounter   Procedures    Adult diet Regular    Adult diet consistent carbohydrate       Activity/Weight Bearing Status: Activity as tolerated    Wound Care:   N/A    Outpatient Follow-Up Plan:     Instructions for  PCP:  -Ensure close cardiology follow up. Plan for repeat TTE  -Monitor LUE clot. May benefit from anticoagulation when cleared by cardiology  -Outpatient hematology/cardiology follow up    Appointments:   Follow-up Information       [facility] -Cardiology Follow up.    Specialty: Cardiology  Contact information:  [address] [facility] [address]  [place]  [address]  [phone]             [place] Toribio MATSU, MD Follow up.    Specialties: Medical Oncology, Hematology  Contact information:  [address]  [address] [address]  [phone]               Lyle Laymon GAILS, FNP .    Specialty: Family Nurse Practitioner  Contact information:  [address]  [place] [address]  [phone]                             Pending Labs, Microbiology, and Pathology:  Unresulted Labs       Procedure . . . Date/Time    Culture and Smear, Acid Fast Bacilli (AFB/Mycobacteria)(Order) [[id number]] Collected: 08/12/24 1012    Specimen: Pericardial Fluid from Pericardium Updated: 08/12/24 2337    Narrative:      The following orders were created for panel order Culture and Smear, Acid Fast Bacilli (AFB/Mycobacteria)(Order).  Procedure                               Abnormality         Status                     ---------                               -----------         ------                     Acid Fast Bacilli Stain.[name].[[id number]]  Normal              Final result               Culture, Acid Fast Baci.SABRASABRA[[id number]]                      In process                   Please view results for these tests on the individual orders.    Culture, Acid Fast Bacilli (AFB/Mycobacteria)(Component) [[id number]] Collected: 08/12/24 1012    Specimen:  Pericardial Fluid from Pericardium Updated: 08/12/24 1055            Attestations and Signatures:     Minutes spent coordinating discharge and reviewing discharge plan: 60 minutes                    [1]   Allergies  Allergen Reactions    Aspirin      Break out in sweat and dizziness     Other Environmental     Peppers     Pollen Extract     Latex Itching and Rash

## 2024-08-15 NOTE — Progress Notes (Signed)
 Patient was discharged home with self care. PT recommended home PT/OT, and junior FWW. Patient called the unit nurse post discharge, stating she needed her walker. CM was not consulted before discharge. PACC order placed post discharge for [facility] PT/OT and for Junior FWW. CM manangement notified, will follow up.

## 2024-08-16 ENCOUNTER — Telehealth: Payer: Self-pay | Admitting: Internal Medicine

## 2024-08-16 LAB — ECG 12-LEAD
Atrial Rate: 119 {beats}/min
P Axis: 46 degrees
P-R Interval: 150 ms
Q-T Interval: 346 ms
QRS Duration: 132 ms
QTC Calculation (Bezet): 486 ms
R Axis: -23 degrees
T Axis: 19 degrees
Ventricular Rate: 119 {beats}/min

## 2024-08-16 LAB — CULTURE AND GRAM STAIN, AEROBIC BACTERIA, WOUND/TISSUE/FLUID
Gram Stain: NO GROWTH
Gram Stain: NONE SEEN
Gram Stain: NONE SEEN

## 2024-08-16 NOTE — Discharge Summary -  Nursing (Signed)
 Patient discharged to home with home health care. IV removed. Tolerating diet well. Family updated of plan of care at bedside. Discharge instructions reviewed with patient and family; all questions answered. Assisted patient to expedite discharge so that her family could drive [facility] before closing. Purposeful hourly rounding completed.

## 2024-08-16 NOTE — Consults (Signed)
 [facility] Note  Home Health Referral    Referral from  (Case Manager) for home health care upon discharge.    By [employer] systems, the patient has the right to freely choose a home care provider.    A company of the patients choosing. We have supplied the patient with a listing of providers in your area who asked to be included and participate in Medicare.   [facility] a home care agency that provides adult home care services and participates in Medicare   The preferred provider of your insurance company. Choosing a home care provider other than your insurance company's preferred provider may affect your insurance coverage.      Home Health Discharge Information    Your doctor has ordered Physical Therapy and Occupational Therapy in-home service(s) for you while you recuperate at home, to assist you in the transition from hospital to home.    The agency that you or your representative chose to provide the service:  Name of Home Health Agency Placement: [facility]]  Phone: [phone]     [employer]:  [employer] ]  Equipment Ordered: Vannie      The above services were set up by:  Murleen Curtistine Meissner, RN (Post Acute Care Coordinator)       IF YOU HAVE NOT HEARD FROM YOUR HOME HEALTH AGENCY WITHIN 24-48 HOURS AFTER DISCHARGE PLEASE CALL YOUR AGENCY TO ARRANGE A TIME FOR YOUR FIRST VISIT. FOR ANY SCHEDULING CONCERNS OR QUESTIONS RELATED TO HOME HEALTH, SUCH AS TIME OR DATE PLEASE CONTACT YOUR HOME HEALTH AGENCY AT THE NUMBER LISTED ABOVE.    Additional comments:        START PATIENT REGISTRATION INFORMATION     Order Information  Order Signing Physician:   Sander Maude NOVAK, MD    Service Ordered RN ?: No    Service Ordered PT ?: Yes  Service Ordered OT ?: Yes  Service Ordered ST ?: No    Service Ordered MSW?: No    Service Ordered HHA?: No    Following Physician: Lyle Laymon GAILS, FNP   Following Physician Phone: [phone]   Overseeing Physician: N/A  (Required for  Residents only)   Agreeable to Follow?: N/A  Spoke with: N/A  Date/Time of Call: 08/16/2024 10:19 AM      Care Coordination   SOC Call from [facility] Required?: no  Same Day [facility]?: no  Primary Care Physician:Meaghan GAILS Lyle, FNP  Primary Care Physician Phone:[phone]  Primary Care Physician Address: [address] [facility] [address] [place] [address]  PCP NPI: [id number]  Visit Instructions: N/A  Service Discharge Location Type: Home  Service Facility Name: N/A  Service Floor Facility: N/A  Service Room No: N/A    Demographics  Patient Last Name: [name]   Patient First Name: Helen  Language/Communication Barrier: no  Service Address: [address]  [place] [address]   Service Home Phone: [phone] (home)   Other phone numbers:    Telephone Information:   Mobile [phone]     Emergency Contact: Extended Emergency Contact Information  Primary Emergency Contact: Gunnison,Tahsha  Address: [address]           [place], [place] [address] [place]  Mobile Phone: [phone]  Relation: Daughter  Secondary Emergency Contact: Candelario,Tabatha  Mobile Phone: [phone]  Relation: Daughter  Interpreter needed? No    Admission Information  Admit Date: 08/11/2024  Patient Status at discharge: Inpatient  Admitting Diagnosis: Pericardial  effusion [I31.39]     Caregiver Information  Caregiver First Name: N/A  Caregiver Last Name: N/A  Caregiver Relationship to Patient: Other  Caregiver Phone Number: N/A  Caregiver Notes: N/A            Data Processing Manager Information  Primary Subscriber:   Primary Subscriber Relation To Guarantor:   Primary Payor:   Primary Plan:   Primary Group #:    Primary Subscriber ID:    Primary Subscriber DOB:   Secondary Insurance Information  Secondary Subscriber:   Secondary Subscriber Relation To Guarantor:   Secondary Payor:   Secondary Plan:   Secondary Group #:   Secondary Subscriber ID:   Secondary Subscriber DOB:   HITECH  NO      END PATIENT  REGISTRATION INFORMATION       Diagnosis: Pericardial effusion [I31.39]    [facility] Summary        Additional Comments:     End PACC Summary     Discharge Date:  08/15/2024    Referral Source  Signed by: Murleen Curtistine Meissner, RN  Date Time: 08/16/2024 10:19 AM      End PACC Note

## 2024-08-16 NOTE — Telephone Encounter (Signed)
 Date: 08/16/2024  Patient Name: [facility]  Post Discharge Phone Call:     Tayden Duran called dated 08/16/2024 @ 1:59 PM  and discussed patient's post-hospital course.  Patient reports they are doing well. Discussed medications.      Maude KATHEE Rave, MD

## 2024-08-17 ENCOUNTER — Encounter (INDEPENDENT_AMBULATORY_CARE_PROVIDER_SITE_OTHER): Payer: Self-pay

## 2024-08-17 LAB — CULTURE BLOOD AEROBIC AND ANAEROBIC
Culture Blood: NO GROWTH
Culture Blood: NO GROWTH

## 2024-08-17 LAB — BLOOD FLOW CYTOMETRY

## 2024-08-17 NOTE — Progress Notes (Signed)
 Ambulatory Care Management   Ambulatory Case Manager contacted patient in response to an automated discharge phone call. Please see call outreach encounter attached.    Hospital patient admitted to: [facility] Admission/Discharge Dates: 08/11/2024-08/15/2024     Reason for admission to hospital: Pericardial effusion       Name: Helen [name]      ### Patient Details  Date of Birth: 12-05-1945  MRN: [id number]      ### Encounter Details  Arrival Date: 08/11/2024 11:32 PM EST  Discharge Date: 08/15/2024 05:32 PM EST  Encounter ID: [id number] 17:32:00    ### Related interaction  [place] - TCM V2 Post Discharge Outreach (Post Discharge TCM V2 Outreach 1) ([email or web address])      ### Questions     Question 1   Home Health Follow Up   Have you heard from the home health agency? Press 1 for yes, Press 2 for no   Hasn't heard from [facility] (Issue Panel: Post Discharge - TCM)      ### Required Interventions and Feedback     Call Status         Comments::     Chart reviewed. Patient received an automated post-discharge outreach call. CM tried reaching patient at [phone] based off response from call but was unsuccessful, voice message left requesting a return call. MB 08/17/2024 @ 1220pm    2nd attempt call made to patient, no answer, voice message left requesting a return call if there are any issues/concerns or assistance needed regarding discharge. (edited by MB on 08/17/2024 03:08 PM EST)     Supplies/DME         Comments:     Per [facility] note, patient arranged with a walker by [employer]. (edited by MB on 08/17/2024 12:19 PM EST)     Supplies/DME - Actions Taken         No Interventions Needed:     Yes (edited by MB on 08/17/2024 12:19 PM EST)     Home Health         Comments:     Per [facility] note, patient arranged with [facility] for PT/OT.  (edited by MB on 08/17/2024 12:19 PM EST)     Home Health - Actions Taken         No  Interventions Needed:     Yes (edited by MB on 08/17/2024 12:19 PM EST)           Marney NOVAK., MPH, MSN, RN  RN Case Manager I  [facility]  [address] [place], [place] [address]  Ph.: [phone]

## 2024-08-18 ENCOUNTER — Encounter (INDEPENDENT_AMBULATORY_CARE_PROVIDER_SITE_OTHER): Payer: Self-pay | Admitting: Family

## 2024-08-19 ENCOUNTER — Ambulatory Visit (INDEPENDENT_AMBULATORY_CARE_PROVIDER_SITE_OTHER): Admitting: Family

## 2024-08-19 ENCOUNTER — Encounter (INDEPENDENT_AMBULATORY_CARE_PROVIDER_SITE_OTHER): Payer: Self-pay | Admitting: Family

## 2024-08-19 VITALS — BP 123/66 | HR 82 | Temp 97.3°F | Wt 146.0 lb

## 2024-08-19 DIAGNOSIS — I10 Essential (primary) hypertension: Secondary | ICD-10-CM

## 2024-08-19 DIAGNOSIS — Z09 Encounter for follow-up examination after completed treatment for conditions other than malignant neoplasm: Secondary | ICD-10-CM

## 2024-08-19 DIAGNOSIS — N179 Acute kidney failure, unspecified: Secondary | ICD-10-CM

## 2024-08-19 DIAGNOSIS — I3139 Other pericardial effusion (noninflammatory): Secondary | ICD-10-CM

## 2024-08-19 DIAGNOSIS — D649 Anemia, unspecified: Secondary | ICD-10-CM

## 2024-08-19 NOTE — Progress Notes (Signed)
 [facility]  [facility]  PROGRESS NOTE      Patient: Helen [name]   Date: 08/19/2024   MRN: [id number]     Bayleigh Loflin is a 78 y.o. female    Chief Complaint   Patient presents with    Hospital Follow-up       MEDICATIONS     Current Medications[1]    Allergies[2]    SUBJECTIVE     Chief Complaint   Patient presents with    Hospital Follow-up        HPI  78 yo female for hospital follow up. Hx HTN, CKD, DMII, breast CA. Presented to ER with acute onset palpitations, back pain, nausea and found to have a large pericardial effusion. Underwent pericardiocentesis and monitored in the ICU. Repeat echo without recurrence of effusion and repeat echo was stable.     Pericardial effusion believed to be idiopathic- cytology without evidence of malignant cells and CT without any obvious evidence of metastatic disease. Cultures were negative for infection as well.     Anemia suspected to be of chronic disease- iron within normal range.   AKI upon admittance, with improvement of GFR by discharge    Pt states she feels well. Denies CP, palpitations, SOB. States has low energy and appetite but is advancing activity as tolerated. She is compliant with her medications.     Has echo scheduled for next week  Plans for oncology follow up  Has cardiology follow up scheduled for Feb        ROS     Review of Systems   Constitutional:  Positive for fatigue. Negative for chills and fever.   Respiratory:  Negative for cough and shortness of breath.    Cardiovascular:  Negative for chest pain and leg swelling.   Gastrointestinal:  Negative for abdominal pain, constipation, diarrhea, nausea and vomiting.   Genitourinary: Negative.    Neurological:  Negative for dizziness and headaches.       The following portions of the patient's history were reviewed and updated as appropriate: Allergies, Current Medications, Past Family History, Past Medical history, Past social history, Past surgical history, and Problem List.    PHYSICAL EXAM      Vitals:    08/19/24 1255   BP: 123/66   BP Site: Left arm   Patient Position: Sitting   Cuff Size: Medium   Pulse: 82   Temp: 97.3 F (36.3 C)   TempSrc: Temporal   SpO2: 98%   Weight: 66.2 kg (146 lb)           Physical Exam  Vitals and nursing note reviewed.   Constitutional:       General: She is awake. She is not in acute distress.     Appearance: Normal appearance.   Cardiovascular:      Rate and Rhythm: Normal rate and regular rhythm.      Heart sounds: Normal heart sounds.   Pulmonary:      Effort: Pulmonary effort is normal.      Breath sounds: Normal breath sounds and air entry.   Musculoskeletal:      Right lower leg: No edema.      Left lower leg: No edema.   Neurological:      Mental Status: She is alert and oriented to person, place, and time.   Psychiatric:         Mood and Affect: Mood normal.         Speech: Speech  normal.         Behavior: Behavior normal.             ASSESSMENT/PLAN        1. Pericardial effusion  ANA IFA with Reflex to Titer/Pattern/Antibody    ANA IFA with Reflex to Titer/Pattern/Antibody    ECHO as scheduled  F/u with cardio and oncology      2. Primary hypertension  Chronic, stable. Home monitoring advised. Continue to hold valsartan -HCTZ as BP currently well controlled on amlodipine  and metoprolol . F/u closely if BP trending up, >140/90.      3. AKI (acute kidney injury)  CBC with Differential (Order)    Basic Metabolic Panel    CBC with Differential (Order)    Basic Metabolic Panel    Acute, plan for labs to monitor.       4. Anemia, unspecified type  Chronic. Plan for close f/u with oncologist.         Reviewed s/s that would warrant further and/ or immediate medical attention. Pt in agreement with plan and all questions answered.       Risk & Benefits of the new medication(s) were explained to the patient (and family) who verbalized understanding & agreed to the treatment plan. Patient (family) encouraged to contact me/clinical staff with any  questions/concerns      Return if symptoms worsen or fail to improve.    Signed,  Laymon LULLA Alert, FNP, FNP  08/19/2024           [1]   Current Outpatient Medications   Medication Sig Dispense Refill    albuterol  (PROVENTIL ) (2.5 MG/3ML) 0.083% nebulizer solution TAKE 3 MLS (2.5 MG TOTAL) BY NEBULIZATION EVERY 4 (FOUR) HOURS AS NEEDED FOR SHORTNESS OF BREATH OR WHEEZING 75 mL 1    albuterol  sulfate HFA (PROVENTIL ) 108 (90 Base) MCG/ACT inhaler INHALE 2 PUFFS INTO THE LUNGS EVERY 4 HOURS AS NEEDED FOR WHEEZING OR SHORTNESS OF BREATH. 6.7 each 2    amLODIPine  (NORVASC ) 10 MG tablet TAKE 1 TABLET (10 MG) BY MOUTH DAILY. 90 tablet 3    atorvastatin  (LIPITOR) 40 MG tablet TAKE 1 TABLET BY MOUTH EVERY DAY 90 tablet 3    Azelastine  HCl 137 MCG/SPRAY Solution 2 SPRAYS BY NASAL ROUTE 2 (TWO) TIMES DAILY USE IN EACH NOSTRIL AS DIRECTED 30 mL 1    Blood Glucose Monitoring Suppl (ONE TOUCH ULTRA 2) W/DEVICE KIT One daily 1 each 0    cetirizine (ZyrTEC) 10 MG tablet Take 1 tablet (10 mg) by mouth once daily      Cholecalciferol (Vitamin D3) 2000 UNIT capsule TAKE 1 CAPSULE BY MOUTH EVERY DAY 90 capsule 1    colchicine  0.6 MG tablet Take 1 tablet (0.6 mg) by mouth once daily 90 tablet 0    dexAMETHasone (DECADRON) 0.1 % ophthalmic solution Place 1 drop into both eyes      EPINEPHrine  (EPIPEN  2-PAK) 0.3 MG/0.3ML Solution Auto-injector injection Inject 0.3 mLs (0.3 mg) into the muscle once as needed (Anaphylaxis) 1 each 3    esomeprazole  (NexIUM ) 40 MG capsule Take 1 capsule (40 mg) by mouth once daily 90 capsule 0    ferrous sulfate 325 (65 FE) MG tablet Take 1 tablet (325 mg) by mouth every morning with breakfast      fluticasone  (FLONASE ) 50 MCG/ACT nasal spray 2 sprays by Nasal route once daily      fluticasone  (FLOVENT  HFA) 220 MCG/ACT inhaler INHALE 1 PUFF INTO THE LUNGS TWICE A DAY 24 each  2    glipiZIDE  (GLUCOTROL ) 10 MG tablet Take 2 tablets (20 mg) by mouth 2 (two) times daily before meals 360 tablet 1    Glucagon , rDNA,  (Glucagon  Emergency) 1 MG Kit Inject 1 mg into the skin once as needed (severe hypoglycemia) 1 kit 1    hydrocortisone  (ANUSOL -HC) 25 MG suppository PLACE 1 SUPPOSITORY (25 MG) RECTALLY 2 (TWO) TIMES DAILY- NOT COVERED 14 suppository 1    Jardiance  25 MG tablet TAKE 1 TABLET BY MOUTH EVERY DAY IN THE MORNING 90 tablet 1    Lancets (onetouch ultrasoft) lancets Use to check blood sugars 2 times daily 200 each 3    lidocaine  (Lidoderm ) 5 % Place 1 patch onto the skin once daily as needed (pain) Remove & Discard patch within 12 hours 30 patch 1    linaGLIPtin  (Tradjenta ) 5 MG Tablet TAKE 1 TABLET BY MOUTH EVERY DAY 90 tablet 0    metFORMIN  (GLUCOPHAGE -XR) 500 MG 24 hr tablet TAKE 2 TABLETS (1,000 MG) BY MOUTH TWICE A DAY 360 tablet 1    metoprolol  tartrate (LOPRESSOR ) 25 MG tablet Take 0.5 tablets (12.5 mg) by mouth every 12 (twelve) hours 90 tablet 0    montelukast (SINGULAIR) 10 MG tablet Take 1 tablet (10 mg) by mouth once daily      NASONEX 50 MCG/ACT nasal spray 2 sprays by Nasal route once daily as needed  5    ondansetron  (ZOFRAN -ODT) 4 MG disintegrating tablet Dissolve 1 tablet (4 mg) in the mouth every 6 (six) hours as needed for Nausea 8 tablet 0    OneTouch Ultra test strip TWICE A DAY 200 strip 3    [Paused] valsartan -hydroCHLOROthiazide  (DIOVAN -HCT) 160-12.5 MG per tablet TAKE 1 TABLET BY MOUTH EVERY DAY (Patient not taking: Reported on 08/19/2024) 90 tablet 0     No current facility-administered medications for this visit.   [2]   Allergies  Allergen Reactions    Aspirin      Break out in sweat and dizziness     Other Environmental     Peppers     Pollen Extract     Latex Itching and Rash

## 2024-08-24 ENCOUNTER — Other Ambulatory Visit

## 2024-08-24 ENCOUNTER — Other Ambulatory Visit (INDEPENDENT_AMBULATORY_CARE_PROVIDER_SITE_OTHER): Payer: Self-pay | Admitting: Nurse Practitioner

## 2024-08-24 ENCOUNTER — Telehealth: Payer: Self-pay

## 2024-08-24 DIAGNOSIS — K219 Gastro-esophageal reflux disease without esophagitis: Secondary | ICD-10-CM

## 2024-08-24 DIAGNOSIS — E119 Type 2 diabetes mellitus without complications: Secondary | ICD-10-CM

## 2024-08-24 MED ORDER — ESOMEPRAZOLE MAGNESIUM 40 MG PO CPDR: 40.0000 mg | DELAYED_RELEASE_CAPSULE | Freq: Every day | ORAL | 3 refills | Status: AC

## 2024-08-24 NOTE — Telephone Encounter (Signed)
 Last OV: 05/21/2024     Next visit: none  Last labs:05/21/2024     My chart message send as reminder to schedule the appointment     Please review if the pended prescription is appropriate. Thank you

## 2024-08-24 NOTE — Telephone Encounter (Signed)
 sent

## 2024-08-24 NOTE — Telephone Encounter (Signed)
 Copied from CRM [id number]. Topic: Clinical Support - Prescription Refill  >> Aug 24, 2024  9:58 AM Nat MATSU wrote:  Helen [name] called about Clinical Support - Prescription Refill.  Additional details:      Name, strength, directions of requested refill(s):    esomeprazole  (NexIUM ) 40 MG capsule  Sig - Route: Take 1 capsule (40 mg) by mouth once daily - Oral    How much medication is remaining:     Pharmacy to send refill to or patient to pick up rx from office (mark requested pharmacy in BOLD):    @PREFPHARMACY @    [id number] - [place], [place] - [address]    Please mark X next to the preferred call back number:    Mobile: [phone] (mobile)   Home: [phone] (home)   Work: @WORKPHONE @       Medication refill request, see above. Thank you   Patient has been informed that medication refill requests should be called in up to one week prior to running out of medication.    Additional Notes:    Next Visit: MM/DD/YYYY

## 2024-08-24 NOTE — Telephone Encounter (Signed)
 Copied from CRM [id number]. Topic: Clinical Support - Prescription Refill  >> Aug 24, 2024  9:58 AM Nat MATSU wrote:  [name] ANN called about Clinical Support - Prescription Refill.  Additional details:      Name, strength, directions of requested refill(s):    esomeprazole  (NexIUM ) 40 MG capsule  Sig - Route: Take 1 capsule (40 mg) by mouth once daily - Oral    How much medication is remaining:     Pharmacy to send refill to or patient to pick up rx from office (mark requested pharmacy in BOLD):    @PREFPHARMACY @    [id number] - [place], [place] - [address]    Please mark X next to the preferred call back number:    Mobile: [phone] (mobile)   Home: [phone] (home)   Work: @WORKPHONE @       Medication refill request, see above. Thank you   Patient has been informed that medication refill requests should be called in up to one week prior to running out of medication.    Additional Notes:    Next Visit: MM/DD/YYYY

## 2024-08-26 ENCOUNTER — Ambulatory Visit
Admission: RE | Admit: 2024-08-26 | Discharge: 2024-08-26 | Disposition: A | Discharge status: Home / Self Care | Source: Ambulatory Visit | Attending: Student in an Organized Health Care Education/Training Program | Admitting: Student in an Organized Health Care Education/Training Program

## 2024-08-26 DIAGNOSIS — I3139 Other pericardial effusion (noninflammatory): Secondary | ICD-10-CM | POA: Insufficient documentation

## 2024-08-26 NOTE — Progress Notes (Signed)
 [place]  HEART CARDIOLOGY OFFICE PROGRESS NOTE    HRT [facility] CENTER  [place]  HEART  OFFICE -CARDIOLOGY  [address]  [place] [address]  Dept: [phone]  Dept Fax: [phone]       Patient Name: Helen [name], Helen [name]    Date of Visit:  September 01, 2024  Date of Birth: 1945/10/18  AGE: 78 y.o.  Medical Record #: [id number]  Requesting Physician: Laymon LULLA Alert, FNP      CHIEF COMPLAINT: Hospital Follow-up (Pericardial effusion)      HISTORY OF PRESENT ILLNESS:    She is a pleasant 78 y.o. female who presents today for hospital follow-up.  She is a past medical history of hypertension, hyperlipidemia, diabetes, previous bladder and breast cancer and right bundle branch block. She was last seen in the office in November.    She was admitted to the hospital December 9th after experiencing rapid heart rate.  She is found to be febrile as well.  CT of chest abdomen pelvis noted large pericardial effusion which is confirmed by echocardiogram; echo did not suggest tamponade.  She underwent pericardiocentesis 12/10 with 470 cc of hemorrhagic fluid drained.  She was started on colchicine  and antibiotics.  Cytology was sent, came back benign without malignancy, oncology involved with her care.  After drain was pulled she did not develop any reaccumulation. She had follow-up echos done on 12/12 and 12/24 with no re-accumulation of fluid.    Ms. Borgwardt has been feeling well.  She endorses some intermittent midsternal chest soreness and rates this as mild, but has been occurring over the last couple of weeks.  She also endorses fatigue but this has been getting better since hospital discharge.  She denies any sharp chest pain or discomfort that worsens with deep breathing or laying down.  She denies any shortness of breath.  She remains on colchicine  0.6 mg daily.  She denies any palpitations dizziness syncope or edema.      PAST MEDICAL HISTORY: She has a past medical history of  Anemia, Arthritis, Arthritis, Asthma, Asthma without status asthmaticus, Atrioventricular conduction disorder, Bilateral cataracts, Breast cancer (CMS/HCC), Breast lump, Cancer (CMS/HCC), Carcinoma (CMS/HCC), Chicken pox, Conduction Disorder, Diabetes mellitus type II, Echocardiogram (10/2013), Gastroesophageal reflux disease, Heart murmur, Hemorrhoids without complication, Holter monitor (02/2014), Hyperlipidemia, Hypertensive disorder, Malignant neoplasm of breast (CMS/HCC), Measles, Mumps, Myocardial perfusion scan (11/2013, 12/2013), Rash, RBBB, Spinal stenosis, 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 She has a past surgical history that includes Breast biopsy; Hysterectomy; Tumor removal (Left); ARTHROSCOPY, KNEE (Left, 04/21/2015); Ablation of dysrhythmic focus; EGD (N/A, 12/07/2022); COLONOSCOPY, SCREENING (N/A, 12/07/2022); Hand surgery; Knee surgery; and Pericardiocentesis (N/A, 08/12/2024).    Allergies  Review status set to Review Complete by Alert Laymon LULLA, FNP on 08/19/2024        Severity Reactions Comments    Aspirin Not Specified  Break out in sweat and dizziness     Other Environmental Not Specified      Peppers Not Specified      Pollen Extract Not Specified      Latex Low Itching, Rash              MEDICATIONS:   Patient's current medications were reviewed. ONLY Cardiac medications were updated unless others were addressed in assessment and plan.    Current Outpatient Medications (Relevant to Cardiology)   Medication Sig    EPINEPHrine  Inject 0.3 mLs (0.3 mg) into the muscle once as needed (Anaphylaxis)    [Paused] valsartan -hydroCHLOROthiazide   TAKE 1 TABLET BY MOUTH EVERY DAY (Patient not taking: Reported on 08/19/2024)    amLODIPine  TAKE 1 TABLET (10 MG) BY MOUTH DAILY.    atorvastatin  TAKE 1 TABLET BY MOUTH EVERY DAY    Jardiance  TAKE 1 TABLET BY MOUTH EVERY DAY IN THE MORNING    metoprolol  tartrate Take 0.5 tablets (12.5 mg) by mouth every 12 (twelve) hours     Current  Outpatient Medications (Other)   Medication Sig    OneTouch Ultra 2 One daily    montelukast Take 1 tablet (10 mg) by mouth once daily    Nasonex 2 sprays by Nasal route once daily as needed    fluticasone  2 sprays by Nasal route once daily    ferrous sulfate Take 1 tablet (325 mg) by mouth every morning with breakfast    dexAMETHasone Place 1 drop into both eyes    albuterol  sulfate HFA INHALE 2 PUFFS INTO THE LUNGS EVERY 4 HOURS AS NEEDED FOR WHEEZING OR SHORTNESS OF BREATH.    albuterol  TAKE 3 MLS (2.5 MG TOTAL) BY NEBULIZATION EVERY 4 (FOUR) HOURS AS NEEDED FOR SHORTNESS OF BREATH OR WHEEZING    glipiZIDE  Take 2 tablets (20 mg) by mouth 2 (two) times daily before meals    onetouch ultrasoft Use to check blood sugars 2 times daily    fluticasone  INHALE 1 PUFF INTO THE LUNGS TWICE A DAY    Glucagon  Emergency Inject 1 mg into the skin once as needed (severe hypoglycemia)    OneTouch Ultra TWICE A DAY    cetirizine Take 1 tablet (10 mg) by mouth once daily    Vitamin D3 TAKE 1 CAPSULE BY MOUTH EVERY DAY    lidocaine  Place 1 patch onto the skin once daily as needed (pain) Remove & Discard patch within 12 hours    Azelastine  HCl 2 SPRAYS BY NASAL ROUTE 2 (TWO) TIMES DAILY USE IN EACH NOSTRIL AS DIRECTED    metFORMIN  TAKE 2 TABLETS (1,000 MG) BY MOUTH TWICE A DAY    hydrocortisone  PLACE 1 SUPPOSITORY (25 MG) RECTALLY 2 (TWO) TIMES DAILY- NOT COVERED    ondansetron  Dissolve 1 tablet (4 mg) in the mouth every 6 (six) hours as needed for Nausea    colchicine  Take 1 tablet (0.6 mg) by mouth once daily    Tradjenta  TAKE 1 TABLET BY MOUTH EVERY DAY    esomeprazole  Take 1 capsule (40 mg) by mouth once daily       FAMILY HISTORY: family history includes Diabetes in her mother, sister, and sister.    SOCIAL HISTORY: She reports that she has never smoked. She has never been exposed to tobacco smoke. She has never used smokeless tobacco. She reports that she does not drink alcohol and does not use drugs.    PHYSICAL  EXAMINATION    Visit Vitals  BP 98/58 (BP Site: Left arm, Patient Position: Sitting, Cuff Size: Medium)   Pulse 80   Ht 1.549 m (5' 1)   Wt 64 kg (141 lb)   BMI 26.64 kg/m       Constitutional: Cooperative, alert, no acute distress.  Neck: No carotid bruits, JVP normal.  Cardiac: Regular rate and rhythm, normal S1 and S2; no S3 or S4. No murmurs. No rubs, no gallops.  Pulmonary: Clear to auscultation bilaterally, no wheezing, no rhonchi, no rales.  Extremities: no edema.  Vascular: +2 pulses in radial artery bilaterally, 2+ pedal pulses bilaterally.    ECG: NSR with Left axis deviation and RBBB  LABS REVIEWED:   Lab Results   Component Value Date    WBC 7.93 08/15/2024    HGB 10.5 (L) 08/15/2024    HCT 33.0 (L) 08/15/2024    PLT 388 (H) 08/15/2024     Lab Results   Component Value Date    GLU 136 (H) 08/15/2024    BUN 11 08/15/2024    CREAT 0.6 08/15/2024    NA 142 08/15/2024    K 4.3 08/15/2024    CL 112 (H) 08/15/2024    CO2 23 08/15/2024    AST 31 08/11/2024    ALT 15 08/11/2024     Lab Results   Component Value Date    MG 2.4 08/14/2024    TSH 0.975 10/28/2023    HGBA1C 7.2 (A) 05/21/2024     Lab Results   Component Value Date    CHOL 137 03/23/2024    TRIG 60 03/23/2024    HDL 88 03/23/2024    LDL 36 03/23/2024     No results found for: LPACHOL    Most recent echo and nuclear study reviewed as well as hospital encounter and pertinent labs notes imaging and procedures      IMPRESSION:   Ms. Kotlarz is a 78 y.o. female with the following problems:    Hospital follow-up for hemorrhagic pericardial effusion without tamponade status post pericardiocentesis 12/10. Drain removed 12/11. Cytology negative for malignancy.  On colchicine  0.6 mg  F/u TTE 12/12 and 12/24 without reaccumulation  Hypertension  Hyperlipidemia  Type 2 diabetes  History of breast cancer  History of bladder cancer  Arthritis  Elevated BMI-29  First-degree AV block and right bundle branch block    RECOMMENDATIONS:    Her pericardial  effusion is believed to be idiopathic.  She does not have any follow-up with oncology or infectious disease.  I have low suspicion that her chest discomfort is related to pericardial effusion as she just had imaging done last week which was normal.  She will continue Colchicine  for total of three months (will stop March 10th 2026).  Asked her to notify us  of any worsening chest pain or difficulty breathing and we can certainly bring her in to reevaluate echocardiogram.    She is going to remain off of her valsartan -HCTZ as her blood pressure is controlled with SBP in the 100s.    She will follow-up with us  in 3 months prior to stopping colchicine  to reassess her symptoms.                                                 Orders Placed This Encounter   Procedures    ECG 12 lead (Normal)    APP Office Visit (HRT [place])       No orders of the defined types were placed in this encounter.      SIGNED:    Hoy Solian, FNP      Please pardon any potential grammatical or typographical errors as aspects of this note may have been created through speech-to-text software.

## 2024-08-28 ENCOUNTER — Encounter (INDEPENDENT_AMBULATORY_CARE_PROVIDER_SITE_OTHER): Payer: Self-pay | Admitting: Cardiovascular Disease

## 2024-08-28 LAB — ECHO ADULT TTE LIMITED
BP Mod LV Ejection Fraction: 59
IVS Diastolic Thickness (2D): 0.9
LA Dimension (2D): 2.8
LVID diastole (2D): 4.3
LVID systole (2D): 2.9

## 2024-09-01 ENCOUNTER — Ambulatory Visit (INDEPENDENT_AMBULATORY_CARE_PROVIDER_SITE_OTHER)

## 2024-09-01 ENCOUNTER — Encounter (INDEPENDENT_AMBULATORY_CARE_PROVIDER_SITE_OTHER): Payer: Self-pay | Admitting: Family

## 2024-09-01 VITALS — BP 98/58 | HR 80 | Ht 61.0 in | Wt 141.0 lb

## 2024-09-01 DIAGNOSIS — I10 Essential (primary) hypertension: Secondary | ICD-10-CM

## 2024-09-01 DIAGNOSIS — I3139 Other pericardial effusion (noninflammatory): Secondary | ICD-10-CM

## 2024-09-01 LAB — ECG 12-LEAD
Atrial Rate: 80 {beats}/min
IHS MUSE NARRATIVE AND IMPRESSION: NORMAL
P Axis: 52 degrees
P-R Interval: 184 ms
Q-T Interval: 430 ms
QRS Duration: 156 ms
QTC Calculation (Bezet): 495 ms
R Axis: -32 degrees
T Axis: 38 degrees
Ventricular Rate: 80 {beats}/min

## 2024-09-02 ENCOUNTER — Telehealth (INDEPENDENT_AMBULATORY_CARE_PROVIDER_SITE_OTHER): Payer: Self-pay | Admitting: Family

## 2024-09-02 ENCOUNTER — Other Ambulatory Visit

## 2024-09-02 DIAGNOSIS — I10 Essential (primary) hypertension: Secondary | ICD-10-CM

## 2024-09-02 MED ORDER — BLOOD PRESSURE KIT: PACK | 1 refills | Status: AC

## 2024-09-02 NOTE — Telephone Encounter (Signed)
 Patient requests that Meag[facility] place an order for a new blood pressure monitor. Patient was informed by her insurance that they are going to stop covering her current model. Patient is currently in the process of being assigned a new endocrinologist as hers has left the practice. Please Advise.

## 2024-09-02 NOTE — Telephone Encounter (Signed)
"  Sent to pharmacy  "

## 2024-09-02 NOTE — Addendum Note (Signed)
 Addended by: LYLE LAYMON ROCKFORD on: 09/02/2024 12:00 PM     Modules accepted: Orders

## 2024-09-03 LAB — CBC AND DIFFERENTIAL
Baso(Absolute): 0.1 x10E3/uL (ref 0.0–0.2)
Basophils Automated: 1 %
Eosinophils Absolute: 0 x10E3/uL (ref 0.0–0.4)
Eosinophils Automated: 0 %
Hematocrit: 33.9 % — ABNORMAL LOW (ref 34.0–46.6)
Hemoglobin: 10.3 g/dL — ABNORMAL LOW (ref 11.1–15.9)
Immature Granulocytes Absolute: 0 x10E3/uL (ref 0.0–0.1)
Immature Granulocytes: 0 %
Lymphocytes Absolute: 1.2 x10E3/uL (ref 0.7–3.1)
Lymphocytes Automated: 12 %
MCH: 22 pg — ABNORMAL LOW (ref 26.6–33.0)
MCHC: 30.4 g/dL — ABNORMAL LOW (ref 31.5–35.7)
MCV: 72 fL — ABNORMAL LOW (ref 79–97)
Monocytes Absolute: 1.2 x10E3/uL — ABNORMAL HIGH (ref 0.1–0.9)
Monocytes: 13 %
Neutrophils Absolute Count: 7.1 x10E3/uL — ABNORMAL HIGH (ref 1.4–7.0)
Neutrophils: 74 %
Platelets: 340 x10E3/uL (ref 150–450)
RBC: 4.69 x10E6/uL (ref 3.77–5.28)
RDW: 14 % (ref 11.7–15.4)
WBC: 9.6 x10E3/uL (ref 3.4–10.8)

## 2024-09-03 LAB — BASIC METABOLIC PANEL
BUN / Creatinine Ratio: 17 (ref 12–28)
BUN: 13 mg/dL (ref 8–27)
CO2: 24 mmol/L (ref 20–29)
Calcium: 9.2 mg/dL (ref 8.7–10.3)
Chloride: 95 mmol/L — ABNORMAL LOW (ref 96–106)
Creatinine: 0.78 mg/dL (ref 0.57–1.00)
Glucose: 137 mg/dL — ABNORMAL HIGH (ref 70–99)
Potassium: 3.5 mmol/L (ref 3.5–5.2)
Sodium: 137 mmol/L (ref 134–144)
eGFR: 78 mL/min/1.73 (ref >59)

## 2024-09-04 ENCOUNTER — Encounter (INDEPENDENT_AMBULATORY_CARE_PROVIDER_SITE_OTHER): Payer: Self-pay | Admitting: Family

## 2024-09-04 ENCOUNTER — Ambulatory Visit (INDEPENDENT_AMBULATORY_CARE_PROVIDER_SITE_OTHER)

## 2024-09-04 ENCOUNTER — Ambulatory Visit (INDEPENDENT_AMBULATORY_CARE_PROVIDER_SITE_OTHER): Payer: Self-pay | Admitting: Family

## 2024-09-04 ENCOUNTER — Encounter (INDEPENDENT_AMBULATORY_CARE_PROVIDER_SITE_OTHER): Payer: Self-pay

## 2024-09-04 ENCOUNTER — Ambulatory Visit: Admitting: Anesthesiology

## 2024-09-04 VITALS — BP 119/67 | HR 81

## 2024-09-04 DIAGNOSIS — D649 Anemia, unspecified: Secondary | ICD-10-CM

## 2024-09-04 DIAGNOSIS — R3915 Urgency of urination: Secondary | ICD-10-CM

## 2024-09-04 DIAGNOSIS — N3001 Acute cystitis with hematuria: Secondary | ICD-10-CM

## 2024-09-04 LAB — POCT URINALYSIS DIPSTIX (10)(MULTI-TEST)
Bilirubin, UA POCT: NEGATIVE
Glucose, UA POCT: >=1000 — AB
Ketones, UA POCT: NEGATIVE mg/dL
Nitrite, UA POCT: NEGATIVE
POCT Spec Gravity, UA: 1.005 (ref 1.001–1.035)
POCT pH, UA: 7 (ref 5–8)
Protein, UA POCT: NEGATIVE mg/dL
Urobilinogen, UA: 1 mg/dL

## 2024-09-04 LAB — ANA IFA WITH REFLEX TO TITER/PATTERN/ANTIBODY: Antinuclear Antibodies (ANA): NEGATIVE

## 2024-09-04 MED ORDER — CEPHALEXIN 500 MG PO CAPS: 500.0000 mg | ORAL_CAPSULE | Freq: Two times a day (BID) | ORAL | 0 refills | Status: AC

## 2024-09-04 NOTE — Progress Notes (Signed)
 [facility]           Subjective     Chief Complaint   Patient presents with    Urinary Tract Infection Symptoms     Burning x2d; urgency; increased frequency     History of Present Illness  Helen [name] is a 79 year old female who presents with symptoms of a urinary tract infection.    Urinary symptoms  - Urinary urgency, frequency, and dysuria for the past two days  - Increased fluid intake  - No fever, abdominal pain, or back pain  - First urinary tract infection in many years    Recent hospitalization  - Hospitalized two weeks ago for pericardial effusion requiring drainage    Chronic medical conditions  - Type 2 diabetes mellitus  - Hypertension  - Hypercholesterolemia  - History of breast cancer    Allergies  - Aspirin  - Peppers  - Latex    Review of Systems:  Per HPI    Objective   BP 119/67 (BP Site: Left arm, Patient Position: Sitting, Cuff Size: Medium)   Pulse 81   Physical Exam  Vitals reviewed.   Constitutional:       General: She is not in acute distress.     Appearance: Normal appearance. She is not ill-appearing.   HENT:      Head: Normocephalic.   Eyes:      General: Lids are normal. Gaze aligned appropriately. No scleral icterus.  Neck:      Thyroid : No thyroid  mass, thyromegaly or thyroid  tenderness.   Cardiovascular:      Rate and Rhythm: Normal rate and regular rhythm.      Pulses:           Radial pulses are 2+ on the right side and 2+ on the left side.      Heart sounds: Normal heart sounds.   Pulmonary:      Effort: Pulmonary effort is normal. No respiratory distress.      Breath sounds: No stridor. No wheezing, rhonchi or rales.   Abdominal:      Tenderness: There is no abdominal tenderness. There is no right CVA tenderness or left CVA tenderness.   Musculoskeletal:         General: No swelling.      Cervical back: Neck supple.      Right lower leg: No edema.      Left lower leg: No edema.   Skin:     General: Skin is warm and dry.      Coloration: Skin is not  cyanotic or jaundiced.   Neurological:      General: No focal deficit present.      Mental Status: She is alert.   Psychiatric:         Mood and Affect: Affect normal.         Behavior: Behavior normal.           Assessment/Plan     Assessment & Plan  Urgency of urination    Orders:    POCT UA Dipstix (10)(Multi-Test); Future    Acute cystitis with hematuria    Orders:    cephALEXin  (KEFLEX ) 500 MG capsule; Take 1 capsule (500 mg) by mouth 2 (two) times daily for 7 days    Culture, Urine; Future      Assessment & Plan  Acute cystitis with hematuria  Urinalysis indicated UTI with hematuria and leukocytes. Recent hospitalization for pericardial effusion necessitates  caution with infections.  - Prescribed Keflex  500 mg BID for 7 days.  - Advised to take Keflex  with food and consider probiotic.  - Sent urine for culture to confirm sensitivity.  - Instructed to start medication today.  - Will message patient with culture results in 3 days to assess need for medication change.      Verbal consent obtained to record this visit.

## 2024-09-04 NOTE — Patient Instructions (Signed)
 VISIT SUMMARY:  Helen [name], you were seen today for symptoms of a urinary tract infection, including urinary urgency, frequency, and pain during urination. You have a history of type 2 diabetes, hypertension, hypercholesterolemia, and breast cancer. You were recently hospitalized for a pericardial effusion.    YOUR PLAN:  -ACUTE CYSTITIS WITH HEMATURIA: You have a urinary tract infection (UTI) with blood in your urine. This is an infection in your bladder. You have been prescribed Keflex  500 mg to be taken twice a day for 7 days. Please take this medication with food and consider taking a probiotic to help maintain healthy gut bacteria. A urine culture has been sent to confirm the best antibiotic for you, and we will follow up with the results in 3 days to see if any changes to your medication are needed.    INSTRUCTIONS:  Please start taking the prescribed medication today. Follow up in 3 days for the urine culture results to determine if any changes to your medication are necessary.

## 2024-09-05 ENCOUNTER — Other Ambulatory Visit (INDEPENDENT_AMBULATORY_CARE_PROVIDER_SITE_OTHER): Payer: Self-pay | Admitting: Family

## 2024-09-05 DIAGNOSIS — J302 Other seasonal allergic rhinitis: Secondary | ICD-10-CM

## 2024-09-07 NOTE — Telephone Encounter (Signed)
 Last filled September 2025.   Last o/v September 2025.  Patient does not have an upcoming appointment.

## 2024-09-08 ENCOUNTER — Ambulatory Visit (INDEPENDENT_AMBULATORY_CARE_PROVIDER_SITE_OTHER): Payer: Self-pay

## 2024-09-08 LAB — CULTURE, URINE

## 2024-09-09 ENCOUNTER — Other Ambulatory Visit (INDEPENDENT_AMBULATORY_CARE_PROVIDER_SITE_OTHER): Payer: Self-pay | Admitting: Family

## 2024-09-09 DIAGNOSIS — I10 Essential (primary) hypertension: Secondary | ICD-10-CM

## 2024-09-11 ENCOUNTER — Ambulatory Visit (INDEPENDENT_AMBULATORY_CARE_PROVIDER_SITE_OTHER): Payer: Self-pay

## 2024-09-11 ENCOUNTER — Encounter (INDEPENDENT_AMBULATORY_CARE_PROVIDER_SITE_OTHER): Payer: Self-pay | Admitting: Family

## 2024-09-11 DIAGNOSIS — I10 Essential (primary) hypertension: Secondary | ICD-10-CM

## 2024-09-11 DIAGNOSIS — E119 Type 2 diabetes mellitus without complications: Secondary | ICD-10-CM

## 2024-09-13 ENCOUNTER — Other Ambulatory Visit (INDEPENDENT_AMBULATORY_CARE_PROVIDER_SITE_OTHER): Payer: Self-pay

## 2024-09-13 ENCOUNTER — Encounter (INDEPENDENT_AMBULATORY_CARE_PROVIDER_SITE_OTHER): Payer: Self-pay | Admitting: Family

## 2024-09-13 DIAGNOSIS — N3001 Acute cystitis with hematuria: Secondary | ICD-10-CM

## 2024-09-14 ENCOUNTER — Encounter: Payer: PRIVATE HEALTH INSURANCE | Admitting: Internal Medicine

## 2024-09-14 ENCOUNTER — Other Ambulatory Visit (INDEPENDENT_AMBULATORY_CARE_PROVIDER_SITE_OTHER): Payer: Self-pay | Admitting: Family

## 2024-09-14 DIAGNOSIS — J4541 Moderate persistent asthma with (acute) exacerbation: Secondary | ICD-10-CM

## 2024-09-14 NOTE — Telephone Encounter (Signed)
 Last filled December 2024.   Last o/v September 2025.  Patient does not have an upcoming appointment  Queued up 12 with 5 refills.

## 2024-09-14 NOTE — Progress Notes (Signed)
 Called pt and scheduled follow up visit with PCP for this Friday. Pt advised to contact office if symptoms worsen prior to appt time. Pt verbally confirmed understanding.

## 2024-09-14 NOTE — Telephone Encounter (Signed)
 Last o/v January 2026.  Patient has an upcoming appointment on May 13 2025.  Queued up

## 2024-09-15 ENCOUNTER — Encounter (INDEPENDENT_AMBULATORY_CARE_PROVIDER_SITE_OTHER): Payer: Self-pay

## 2024-09-15 ENCOUNTER — Ambulatory Visit (INDEPENDENT_AMBULATORY_CARE_PROVIDER_SITE_OTHER)

## 2024-09-15 VITALS — BP 118/69 | HR 77 | Temp 97.5°F | Wt 142.0 lb

## 2024-09-15 DIAGNOSIS — N952 Postmenopausal atrophic vaginitis: Secondary | ICD-10-CM

## 2024-09-15 DIAGNOSIS — Z789 Other specified health status: Secondary | ICD-10-CM

## 2024-09-15 DIAGNOSIS — N3001 Acute cystitis with hematuria: Secondary | ICD-10-CM

## 2024-09-15 LAB — POCT URINALYSIS AUTOMATED (IAH)
Bilirubin, UA POCT: NEGATIVE
Glucose, UA POCT: >=1000 — AB
Ketones, UA POCT: NEGATIVE mg/dL
Nitrite, UA POCT: NEGATIVE
PH, UA POCT: 6.5
Protein, UA POCT: NEGATIVE mg/dL
Specific Gravity, UA POCT: <=1.005 mg/dL
Urobilinogen, UA POCT: 0.2 mg/dL

## 2024-09-15 MED ORDER — SULFAMETHOXAZOLE-TRIMETHOPRIM 800-160 MG PO TABS: 1.0000 | ORAL_TABLET | Freq: Two times a day (BID) | ORAL | 0 refills | Status: AC

## 2024-09-15 MED ORDER — ESTRADIOL 0.01 % VA CREA: TOPICAL_CREAM | VAGINAL | 1 refills | Status: AC

## 2024-09-15 NOTE — Progress Notes (Signed)
 [facility]           Subjective     Chief Complaint   Patient presents with    UTI     Started to burn ( finished anti)        History of Present Illness  Helen [name] is a 79 year old female who presents with recurrent urinary tract infection symptoms.    Lower urinary tract symptoms  - Mild burning with urination for 2 days  - Increased urinary frequency for 2 days  - Symptoms recurred after a 3-4 day period without symptoms  - No back pain    Recent urinary tract infection  - Recent UTI treated with medication with resolution of symptoms  - No UTIs for years prior to recent episode    Glycemic control  - Blood sugar reportedly well controlled    Review of Systems:  Per HPI    Objective   BP 118/69 (BP Site: Left arm, Patient Position: Sitting)   Pulse 77   Temp 97.5 F (36.4 C)   Wt 64.4 kg (142 lb)   SpO2 99%   BMI 26.83 kg/m   Physical Exam  Vitals reviewed.   Constitutional:       General: She is not in acute distress.     Appearance: Normal appearance. She is not ill-appearing.   HENT:      Head: Normocephalic.   Eyes:      General: Lids are normal. Gaze aligned appropriately. No scleral icterus.  Neck:      Thyroid : No thyroid  mass, thyromegaly or thyroid  tenderness.   Cardiovascular:      Rate and Rhythm: Normal rate and regular rhythm.      Pulses:           Radial pulses are 2+ on the right side and 2+ on the left side.      Heart sounds: Normal heart sounds.   Pulmonary:      Effort: Pulmonary effort is normal. No respiratory distress.      Breath sounds: No stridor. No wheezing, rhonchi or rales.   Abdominal:      Tenderness: There is no abdominal tenderness. There is no right CVA tenderness or left CVA tenderness.   Musculoskeletal:         General: No swelling.      Cervical back: Neck supple.      Right lower leg: No edema.      Left lower leg: No edema.   Skin:     General: Skin is warm and dry.      Coloration: Skin is not cyanotic or jaundiced.   Neurological:       General: No focal deficit present.      Mental Status: She is alert.   Psychiatric:         Mood and Affect: Affect normal.         Behavior: Behavior normal.           Assessment/Plan     Assessment & Plan  Discomfort    Orders:    POCT UA Clinitek AX (urine dipstick); Future    Acute cystitis with hematuria    Orders:    estradiol  (ESTRACE ) 0.01 % vaginal cream; 0.5 g of cream intravaginally administered twice weekly    Culture, Urine; Future    sulfamethoxazole -trimethoprim  (Bactrim  DS) 800-160 MG per tablet; Take 1 tablet by mouth 2 (two) times daily for 7 days  Assessment & Plan  Recurrent urinary tract infection  Likely UTI indicated by moderate leukocytes on dipstick. Previous UTI responded to antibiotics. Blood sugar control reduces recurrence risk.  - Prescribed Bactrim , one tablet twice daily for one week.  - Ordered urine culture for confirmation and sensitivity.  - Advised increased water intake and frequent urination.  - Instructed to urinate every two hours.  - Sent message with urine culture results in three days.    Postmenopausal atrophic vaginitis  Vaginal estrogen cream recommended for reducing UTI recurrence and treating atrophy. Safe for long-term use.  - Prescribed vaginal estrogen cream, apply twice weekly.  - Instructed to apply cream inside vagina, urethra, and labia.  - Sent prescription to pharmacy.      Verbal consent obtained to record this visit.

## 2024-09-17 ENCOUNTER — Encounter (INDEPENDENT_AMBULATORY_CARE_PROVIDER_SITE_OTHER): Payer: Self-pay | Admitting: Family

## 2024-09-18 ENCOUNTER — Ambulatory Visit (INDEPENDENT_AMBULATORY_CARE_PROVIDER_SITE_OTHER): Admitting: Family

## 2024-09-18 LAB — CULTURE, URINE

## 2024-09-22 ENCOUNTER — Ambulatory Visit (INDEPENDENT_AMBULATORY_CARE_PROVIDER_SITE_OTHER): Payer: Self-pay

## 2024-09-23 ENCOUNTER — Other Ambulatory Visit: Payer: Self-pay | Admitting: Family Medicine

## 2024-09-23 DIAGNOSIS — N952 Postmenopausal atrophic vaginitis: Secondary | ICD-10-CM

## 2024-09-23 LAB — LAB USE ONLY - CULTURE, ACID FAST BACILLI (AFB/MYCOBACTERIA): Culture Acid Fast Bacillus (AFB): NO GROWTH

## 2024-09-24 ENCOUNTER — Ambulatory Visit (INDEPENDENT_AMBULATORY_CARE_PROVIDER_SITE_OTHER): Admitting: Endocrinology, Diabetes and Metabolism

## 2024-09-25 ENCOUNTER — Other Ambulatory Visit (INDEPENDENT_AMBULATORY_CARE_PROVIDER_SITE_OTHER): Payer: Self-pay

## 2024-09-25 ENCOUNTER — Other Ambulatory Visit (INDEPENDENT_AMBULATORY_CARE_PROVIDER_SITE_OTHER): Payer: Self-pay | Admitting: Family

## 2024-09-25 DIAGNOSIS — E119 Type 2 diabetes mellitus without complications: Secondary | ICD-10-CM

## 2024-09-25 DIAGNOSIS — I10 Essential (primary) hypertension: Secondary | ICD-10-CM

## 2024-09-25 NOTE — Telephone Encounter (Signed)
 Last Visit:  05/21/24    ICD:   E11.65    Due for f/u:  11/18/24    F/u appt: None scheduled. [facility] sent    Last lab: 05/21/24

## 2024-09-25 NOTE — Telephone Encounter (Signed)
 Copied from CRM [id number]. Topic: Clinical Support - Prescription Refill  >> Sep 25, 2024  3:23 PM Tiffany S wrote:  [name] [facility] called about Clinical Support - Prescription Refill.  Additional details:  >> Sep 25, 2024  3:27 PM Tiffany S wrote:  Note to Scheduling Agent:  Please forward this telephone encounter as normal priority to the [employer] or [employer], based on the nature of the request, for currently established patients.    This telephone encounter is submitted on behalf of: Helen [name]     The patient is contacting us  with a general question related to one of the following areas. Please provide a clear and detailed summary of the issue, and remove any sections that do not apply:        Patient Advice for established patients only:    Pt states that as of January 1,2026 her insurance will no longer cover the OneTouch Ultra 2 and that she needed a new one that her insurance will cover, pt states she has not run out but will need a new one soon.

## 2024-09-29 MED ORDER — FREESTYLE LANCETS MISC: 5 refills | Status: AC

## 2024-09-29 MED ORDER — FREESTYLE FREEDOM LITE W/DEVICE KIT: PACK | 0 refills | Status: AC

## 2024-09-29 MED ORDER — FREESTYLE LITE TEST VI STRP: ORAL_STRIP | 5 refills | Status: AC

## 2024-09-29 NOTE — H&P (Signed)
 " TOTAL KNEE ADMISSION H&P  Patient is being admitted for left total knee arthroplasty.  Subjective:  Chief Complaint: Left knee pain.  HPI: April Beck, 79 y.o. female has a history of pain and functional disability in the left knee due to arthritis and has failed non-surgical conservative treatments for greater than 12 weeks to include NSAID's and/or analgesics, corticosteriod injections, viscosupplementation injections, flexibility and strengthening excercises, use of assistive devices, and activity modification. Onset of symptoms was gradual, starting several years ago with gradually worsening course since that time. The patient noted no past surgery on the left knee.  Patient currently rates pain in the left knee at 8 out of 10 with activity. Patient has night pain, worsening of pain with activity and weight bearing, pain that interferes with activities of daily living, and crepitus. Patient has evidence of advanced arthritic changes in the medial and patellofemoral compartments of the left knee, with near bone-on-bone contact. The right knee shows good preservation of joint space with mild arthritic changes by imaging studies. There is no active infection.  Patient Active Problem List   Diagnosis Date Noted   Primary osteoarthritis of left knee 08/07/2024   Pain, joint, knee, left 07/27/2024   Positive ANA (antinuclear antibody) 06/03/2024   Cervicalgia 06/03/2024   Cerumen impaction 09/16/2023   Acute sinusitis 09/16/2023   Suburethral sling present 04/25/2023   Arthritis of carpometacarpal [facility]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 01/16/2022   Chronic constipation 11/21/2020   CKD (chronic kidney disease) stage 3, GFR 30-59 ml/min (HCC) 11/21/2020   Herpes 11/21/2020   Hypokalemia 11/21/2020   Weakness 11/17/2020   Aspiration into airway    Fall    Dysphagia 06/14/2013   Atrophic vaginitis 06/14/2013   Hypercholesteremia    Hypertension    GERD (gastroesophageal reflux disease)     Hypothyroidism    Renal insufficiency 06/19/2012    Past Medical History:  Diagnosis Date   Anxiety    hx of panic attack   Arthritis 2013   hands & knees   CKD (chronic kidney disease), stage III (HCC)    followed by pcp   Closed fracture of left distal radius 10/31/2021   Edema of both lower extremities    GERD (gastroesophageal reflux disease) 2013   History of 2019 novel coronavirus disease (COVID-19) 09/17/2020   positive result in epic,  per pt very mild symtpoms that resolved   History of uterine fibroid    Hypertension 2023   followed by pcp   Hypomagnesemia    Hypothyroidism    followed by pcp   IDA (iron deficiency anemia)    Osteopenia    Ovarian cyst    Pelvic pain    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, takes meloxicam    Wears contact lenses     Past Surgical History:  Procedure Laterality Date   ABDOMINAL HYSTERECTOMY  1982   CATARACT EXTRACTION Bilateral    CHOLECYSTECTOMY  06/20/2012   Procedure: LAPAROSCOPIC CHOLECYSTECTOMY;  Surgeon: Dann FORBES Hummer, MD;  Location: [facility] OR;  Service: General;  Laterality: N/A;   COLONOSCOPY  last one 03-20-2019  dr teressa   DIAGNOSTIC LAPAROSCOPY  yrs ago   EYE SURGERY  09/27/22   Cararact removal and lens implant   HIP ARTHROPLASTY Right 11/13/2020   Procedure: ARTHROPLASTY BIPOLAR HIP (HEMIARTHROPLASTY);  Surgeon: Margrette Taft FORBES, MD;  Location: [facility] ORS;  Service: Orthopedics;  Laterality: Right;   INCONTINENCE SURGERY  09-11-2005   dr watt  @  [facility]   LYNX SLING   JOINT REPLACEMENT  11/12/20   LAPAROSCOPIC SALPINGO OOPHERECTOMY Bilateral 01/16/2021   Procedure: DIAGNOSTIC LAPAROSCOPY;  Surgeon: Johnnye Ade, MD;  Location: [facility];  Service: Gynecology;  Laterality: Bilateral;   LAPAROSCOPY Bilateral 01/16/2021   Procedure: EXPLORATORY LAPAROTOMY BILATERAL SALPINGO OOPHORECTOMY;  Surgeon: Johnnye Ade, MD;  Location: [facility];  Service:  Gynecology;  Laterality: Bilateral;   TUBAL LIGATION      Prior to Admission medications  Medication Sig Start Date End Date Taking? Authorizing Provider  acetaminophen  (TYLENOL ) 325 MG tablet Take 2 tablets (650 mg total) by mouth every 6 (six) hours as needed for pain. 06/21/12   Tonnie George, PA-C  acyclovir  (ZOVIRAX ) 400 MG tablet TAKE 1 TABLET EVERY DAY 06/22/24   Duanne Butler DASEN, MD  atorvastatin  (LIPITOR) 40 MG tablet TAKE 1 TABLET EVERY DAY 06/22/24   Duanne Butler DASEN, MD  calcium -vitamin D  (OSCAL WITH D) 250-125 MG-UNIT tablet Take 1 tablet by mouth daily.    [provider]  ciclopirox  (PENLAC ) 8 % solution Apply topically at bedtime. Apply over nail and surrounding skin. Apply daily over previous coat. After seven (7) days, may remove with alcohol and continue cycle. 04/09/24   Gershon Donnice SAUNDERS, DPM  citalopram  (CELEXA ) 10 MG tablet TAKE 1 TABLET EVERY DAY 06/22/24   Duanne Butler DASEN, MD  clobetasol (TEMOVATE) 0.05 % external solution Apply topically 2 (two) times daily as needed. 04/30/24   [provider]  diphenoxylate -atropine  (LOMOTIL ) 2.5-0.025 MG tablet TAKE 2 TABLETS BY MOUTH 4 (FOUR) TIMES DAILY AS NEEDED FOR DIARRHEA OR LOOSE STOOLS. 06/30/24   Duanne Butler DASEN, MD  estradiol  (ESTRACE ) 0.01 % CREA vaginal cream PLACE 1 APPLICATORFUL VAGINALLY AT BEDTIME. 09/23/24   Duanne Butler DASEN, MD  fluconazole  (DIFLUCAN ) 150 MG tablet Take 1 tablet (150 mg total) by mouth daily. Patient taking differently: Take 150 mg by mouth as needed. 09/16/23   Aguiar, Rafaela, MD  fluocinonide gel (LIDEX) 0.05 % Apply 1 application topically 3 (three) times daily as needed (rash). 09/08/20   [provider]  fluticasone  (CUTIVATE ) 0.05 % cream Apply topically 2 (two) times daily as needed. 04/30/24   [provider]  HYDROcodone  bit-homatropine (HYCODAN) 5-1.5 MG/5ML syrup Take 5 mLs by mouth every 8 (eight) hours as needed for cough. 08/14/24   Duanne Butler DASEN, MD  levothyroxine  (SYNTHROID ) 50 MCG tablet TAKE 1 TABLET EVERY DAY 04/13/24   Duanne Butler DASEN, MD  losartan  (COZAAR ) 50 MG tablet TAKE 1 TABLET EVERY DAY 06/22/24   Duanne Butler DASEN, MD  meclizine  (ANTIVERT ) 25 MG tablet Take 1 tablet (25 mg total) by mouth 3 (three) times daily as needed. 09/05/23   Duanne Butler DASEN, MD  pantoprazole  (PROTONIX ) 40 MG tablet TAKE 1 TABLET TWICE DAILY 04/13/24   Duanne Butler DASEN, MD  Tavaborole  (KERYDIN ) 5 % SOLN Apply 1 drop topically daily. Apply 1 drop to the toenail daily. 04/09/24   Gershon Donnice SAUNDERS, DPM  tretinoin (RETIN-A) 0.025 % cream Apply topically at bedtime. 05/25/24   [provider]  triamcinolone  cream (KENALOG ) 0.1 % APPLY TO AFFECTED AREA TWICE A DAY    [provider]    Allergies[1]  Social History   Socioeconomic History   Marital status: Divorced    Spouse name: Not on file   Number of children: Not on file   Years of education: Not on file   Highest education level: 12th grade  Occupational History  Not on file  Tobacco Use   Smoking status: Never    Passive exposure: Past   Smokeless tobacco: Never  Vaping Use   Vaping status: Never Used  Substance and Sexual Activity   Alcohol use: No   Drug use: Never   Sexual activity: Not Currently    Birth control/protection: Surgical  Other Topics Concern   Not on file  Social History Narrative   Not on file   Social Drivers of Health   Tobacco Use: Low Risk (08/14/2024)   Patient History    Smoking Tobacco Use: Never    Smokeless Tobacco Use: Never    Passive Exposure: Past  Financial Resource Strain: Low Risk (07/06/2024)   Overall Financial Resource Strain (CARDIA)    Difficulty of Paying Living Expenses: Not very hard  Recent Concern: Financial Resource Strain - Medium Risk (04/15/2024)   Overall Financial Resource Strain (CARDIA)    Difficulty of Paying Living Expenses: Somewhat hard  Food Insecurity: No Food Insecurity (07/06/2024)    Epic    Worried About Radiation Protection Practitioner of Food in the Last Year: Never true    Ran Out of Food in the Last Year: Never true  Transportation Needs: No Transportation Needs (07/06/2024)   Epic    Lack of Transportation (Medical): No    Lack of Transportation (Non-Medical): No  Physical Activity: Inactive (04/15/2024)   Exercise Vital Sign    Days of Exercise per Week: 0 days    Minutes of Exercise per Session: 0 min  Stress: No Stress Concern Present (07/06/2024)   [employer] of Occupational Health - Occupational Stress Questionnaire    Feeling of Stress: Not at all  Social Connections: Moderately Integrated (07/06/2024)   Social Connection and Isolation Panel    Frequency of Communication with Friends and Family: Three times a week    Frequency of Social Gatherings with Friends and Family: Three times a week    Attends Religious Services: More than 4 times per year    Active Member of Clubs or Organizations: Yes    Attends Banker Meetings: Patient declined    Marital Status: Divorced  Intimate Partner Violence: Not At Risk (04/15/2024)   Epic    Fear of Current or Ex-Partner: No    Emotionally Abused: No    Physically Abused: No    Sexually Abused: No  Depression (PHQ2-9): Low Risk (04/15/2024)   Depression (PHQ2-9)    PHQ-2 Score: 0  Alcohol Screen: Low Risk (04/15/2024)   Alcohol Screen    Last Alcohol Screening Score (AUDIT): 0  Housing: Low Risk (07/06/2024)   Epic    Unable to Pay for Housing in the Last Year: No    Number of Times Moved in the Last Year: 0    Homeless in the Last Year: No  Utilities: Not At Risk (04/15/2024)   Epic    Threatened with loss of utilities: No  Health Literacy: Adequate Health Literacy (04/15/2024)   B1300 Health Literacy    Frequency of need for help with medical instructions: Never    Tobacco Use: Low Risk (08/14/2024)   Patient History    Smoking Tobacco Use: Never    Smokeless Tobacco Use: Never    Passive Exposure: Past    Social History   Substance and Sexual Activity  Alcohol Use No    Family History  Problem Relation Age of Onset   Alzheimer's disease Mother    Arthritis Father    Stroke Father  Diabetes Brother    Hypertension Brother    Colon polyps Brother    Heart disease Brother    Diabetes Brother    Hypertension Brother    Dementia Brother    Breast cancer Maternal Grandmother    Colon cancer Neg Hx    Esophageal cancer Neg Hx    Rectal cancer Neg Hx    Stomach cancer Neg Hx     Review of Systems  Constitutional:  Negative for chills and fever.  Respiratory:  Negative for cough.   Cardiovascular:  Negative for chest pain.  Gastrointestinal:  Negative for abdominal pain.  Genitourinary:  Negative for dysuria.  Musculoskeletal:  Positive for joint pain.    Objective:  Physical Exam: - General: Well-developed female, alert, oriented, no apparent distress. - Hips: Normal range of motion with discomfort. - Right Knee: No effusion. Range of motion 0-130. No tenderness or instability. - Left Knee: No effusion. Range of motion 0-125. Marked crepitus on range of motion. Tender lateral greater than medial. No instability. - Gait: Antalgic gait pattern on the left.  Imaging Review Plain radiographs demonstrate advanced arthritic changes in the medial and patellofemoral compartments of the left knee, with near bone-on-bone contact. The right knee shows good preservation of joint space with mild arthritic changes.  Assessment/Plan:  End stage arthritis, left knee   The patient history, physical examination, clinical judgment of the provider and imaging studies are consistent with end stage degenerative joint disease of the left knee and total knee arthroplasty is deemed medically necessary. The treatment options including medical management, injection therapy arthroscopy and arthroplasty were discussed at length. The risks and benefits of total knee arthroplasty were presented and  reviewed. The risks due to aseptic loosening, infection, stiffness, patella tracking problems, thromboembolic complications and other imponderables were discussed. The patient acknowledged the explanation, agreed to proceed with the plan and consent was signed. Patient is being admitted for inpatient treatment for surgery, pain control, PT, OT, prophylactic antibiotics, VTE prophylaxis, progressive ambulation and ADLs and discharge planning. The patient is planning to be discharged home.   Patient's anticipated LOS is less than 2 midnights, meeting these requirements: - Lives within 1 hour of care - Has a competent adult at home to recover with post-op recover - NO history of  - Chronic pain requiring opiods  - Diabetes  - Coronary Artery Disease  - Heart failure  - Heart attack  - Stroke  - DVT/VTE  - Cardiac arrhythmia  - Respiratory Failure  - Advanced Liver disease  Therapy Plans: EO [facility] Disposition: Home with son and daughter and law  Planned DVT Prophylaxis: Eliquis 2.5mg  BID  DME Needed: none PCP: Butler Burr, MD (clearance received)  TXA: IV  Allergies: NKDA Metal Allergies: none Anesthesia Concerns: none BMI: 26.3  Last HgbA1c: not diabetic  Pharmacy: [facility]PP to bring on DC Pain regimen: Oxycodone    Other: - hx of renal disease, advised not to take Aspirin  or other medications that is kidney harming, discussed useing eliquis for blood clot prevention following surgery   - Patient was instructed on what medications to stop prior to surgery. - Follow-up visit in 2 weeks with Dr. Melodi - Begin physical therapy following surgery - Pre-operative lab work as pre-surgical testing - Prescriptions will be provided in hospital at time of discharge  Waddell Sor, PA-C Orthopedic Surgery [facility] [place]      [1] No Known Allergies  "

## 2024-10-01 ENCOUNTER — Telehealth: Payer: Self-pay | Admitting: Physical Medicine and Rehabilitation

## 2024-10-01 NOTE — Telephone Encounter (Signed)
 Will call back after she talks to family

## 2024-10-01 NOTE — Telephone Encounter (Signed)
 Pt request a call to discuss another appt for an injection and whether a driver is needed.

## 2024-10-03 ENCOUNTER — Encounter: Payer: Self-pay | Admitting: Family Medicine

## 2024-10-06 ENCOUNTER — Ambulatory Visit: Payer: PRIVATE HEALTH INSURANCE | Admitting: Family Medicine

## 2024-10-06 ENCOUNTER — Telehealth (INDEPENDENT_AMBULATORY_CARE_PROVIDER_SITE_OTHER): Payer: Self-pay

## 2024-10-06 ENCOUNTER — Encounter (INDEPENDENT_AMBULATORY_CARE_PROVIDER_SITE_OTHER): Payer: Self-pay

## 2024-10-06 NOTE — Telephone Encounter (Signed)
 Key: [id number]

## 2024-10-07 ENCOUNTER — Encounter (HOSPITAL_COMMUNITY): Admission: RE | Admit: 2024-10-07 | Payer: PRIVATE HEALTH INSURANCE

## 2024-10-08 ENCOUNTER — Encounter: Payer: Self-pay | Admitting: Family Medicine

## 2024-10-12 ENCOUNTER — Encounter (HOSPITAL_COMMUNITY): Admission: RE | Admit: 2024-10-12 | Payer: PRIVATE HEALTH INSURANCE

## 2024-10-15 ENCOUNTER — Ambulatory Visit (INDEPENDENT_AMBULATORY_CARE_PROVIDER_SITE_OTHER): Admitting: Residents

## 2024-10-19 ENCOUNTER — Ambulatory Visit (HOSPITAL_COMMUNITY): Admit: 2024-10-19 | Payer: PRIVATE HEALTH INSURANCE | Admitting: Orthopedic Surgery

## 2024-10-19 SURGERY — ARTHROPLASTY, KNEE, TOTAL
Anesthesia: Choice | Site: Knee | Laterality: Left

## 2024-11-06 ENCOUNTER — Ambulatory Visit (INDEPENDENT_AMBULATORY_CARE_PROVIDER_SITE_OTHER)

## 2024-11-27 ENCOUNTER — Encounter (INDEPENDENT_AMBULATORY_CARE_PROVIDER_SITE_OTHER)

## 2024-12-07 ENCOUNTER — Ambulatory Visit: Payer: PRIVATE HEALTH INSURANCE | Admitting: Family Medicine

## 2025-03-26 ENCOUNTER — Encounter: Payer: PRIVATE HEALTH INSURANCE | Admitting: Family Medicine

## 2025-04-21 ENCOUNTER — Ambulatory Visit: Payer: PRIVATE HEALTH INSURANCE

## 2025-05-13 ENCOUNTER — Ambulatory Visit (INDEPENDENT_AMBULATORY_CARE_PROVIDER_SITE_OTHER): Admitting: Family
